# Patient Record
Sex: Male | Born: 1950 | Race: Black or African American | Hispanic: No | Smoking: Current every day smoker
Health system: Southern US, Community
[De-identification: ages and names within clinical notes are randomized; demographics above are authoritative.]

## PROBLEM LIST (undated history)

## (undated) DIAGNOSIS — I2699 Other pulmonary embolism without acute cor pulmonale: Secondary | ICD-10-CM

## (undated) DIAGNOSIS — K5792 Diverticulitis of intestine, part unspecified, without perforation or abscess without bleeding: Secondary | ICD-10-CM

## (undated) DIAGNOSIS — K5790 Diverticulosis of intestine, part unspecified, without perforation or abscess without bleeding: Secondary | ICD-10-CM

## (undated) DIAGNOSIS — I10 Essential (primary) hypertension: Secondary | ICD-10-CM

## (undated) DIAGNOSIS — R112 Nausea with vomiting, unspecified: Secondary | ICD-10-CM

## (undated) DIAGNOSIS — F419 Anxiety disorder, unspecified: Secondary | ICD-10-CM

## (undated) DIAGNOSIS — B029 Zoster without complications: Secondary | ICD-10-CM

## (undated) DIAGNOSIS — I82409 Acute embolism and thrombosis of unspecified deep veins of unspecified lower extremity: Secondary | ICD-10-CM

## (undated) DIAGNOSIS — Z86718 Personal history of other venous thrombosis and embolism: Secondary | ICD-10-CM

## (undated) DIAGNOSIS — K469 Unspecified abdominal hernia without obstruction or gangrene: Secondary | ICD-10-CM

## (undated) DIAGNOSIS — G629 Polyneuropathy, unspecified: Secondary | ICD-10-CM

## (undated) DIAGNOSIS — M549 Dorsalgia, unspecified: Secondary | ICD-10-CM

## (undated) DIAGNOSIS — F329 Major depressive disorder, single episode, unspecified: Secondary | ICD-10-CM

## (undated) DIAGNOSIS — F32A Depression, unspecified: Secondary | ICD-10-CM

## (undated) HISTORY — DX: Polyneuropathy, unspecified: G62.9

## (undated) HISTORY — DX: Personal history of other venous thrombosis and embolism: Z86.718

## (undated) HISTORY — DX: Diverticulosis of intestine, part unspecified, without perforation or abscess without bleeding: K57.90

## (undated) HISTORY — DX: Major depressive disorder, single episode, unspecified: F32.9

## (undated) HISTORY — PX: APPENDECTOMY: SHX54

## (undated) HISTORY — DX: Unspecified abdominal hernia without obstruction or gangrene: K46.9

## (undated) HISTORY — PX: HERNIA REPAIR: SHX51

## (undated) HISTORY — DX: Depression, unspecified: F32.A

## (undated) HISTORY — PX: BACK SURGERY: SHX140

## (undated) HISTORY — DX: Diverticulitis of intestine, part unspecified, without perforation or abscess without bleeding: K57.92

## (undated) HISTORY — PX: HIATAL HERNIA REPAIR: SHX195

## (undated) HISTORY — DX: Anxiety disorder, unspecified: F41.9

## (undated) HISTORY — DX: Nausea with vomiting, unspecified: R11.2

## (undated) HISTORY — DX: Essential (primary) hypertension: I10

---

## 2006-09-09 HISTORY — PX: CHOLECYSTECTOMY: SHX55

## 2008-04-06 ENCOUNTER — Encounter: Admission: RE | Admit: 2008-04-06 | Discharge: 2008-04-06 | Payer: Self-pay | Admitting: Orthopaedic Surgery

## 2008-04-06 ENCOUNTER — Emergency Department (HOSPITAL_COMMUNITY): Admission: EM | Admit: 2008-04-06 | Discharge: 2008-04-06 | Payer: Self-pay | Admitting: Emergency Medicine

## 2008-12-22 ENCOUNTER — Inpatient Hospital Stay (HOSPITAL_COMMUNITY): Admission: EM | Admit: 2008-12-22 | Discharge: 2008-12-30 | Payer: Self-pay | Admitting: Emergency Medicine

## 2008-12-27 ENCOUNTER — Ambulatory Visit: Payer: Self-pay | Admitting: Vascular Surgery

## 2008-12-27 ENCOUNTER — Encounter (INDEPENDENT_AMBULATORY_CARE_PROVIDER_SITE_OTHER): Payer: Self-pay | Admitting: Internal Medicine

## 2009-01-19 ENCOUNTER — Emergency Department (HOSPITAL_COMMUNITY): Admission: EM | Admit: 2009-01-19 | Discharge: 2009-01-19 | Payer: Self-pay | Admitting: Emergency Medicine

## 2009-02-09 ENCOUNTER — Encounter: Admission: RE | Admit: 2009-02-09 | Discharge: 2009-02-09 | Payer: Self-pay | Admitting: *Deleted

## 2009-02-25 ENCOUNTER — Emergency Department (HOSPITAL_COMMUNITY): Admission: EM | Admit: 2009-02-25 | Discharge: 2009-02-25 | Payer: Self-pay | Admitting: Emergency Medicine

## 2009-03-20 ENCOUNTER — Emergency Department (HOSPITAL_COMMUNITY): Admission: EM | Admit: 2009-03-20 | Discharge: 2009-03-20 | Payer: Self-pay | Admitting: Emergency Medicine

## 2009-05-22 ENCOUNTER — Emergency Department (HOSPITAL_COMMUNITY): Admission: EM | Admit: 2009-05-22 | Discharge: 2009-05-23 | Payer: Self-pay | Admitting: Emergency Medicine

## 2009-05-25 ENCOUNTER — Inpatient Hospital Stay (HOSPITAL_COMMUNITY): Admission: EM | Admit: 2009-05-25 | Discharge: 2009-05-28 | Payer: Self-pay | Admitting: Emergency Medicine

## 2009-08-01 ENCOUNTER — Emergency Department (HOSPITAL_COMMUNITY): Admission: EM | Admit: 2009-08-01 | Discharge: 2009-08-02 | Payer: Self-pay | Admitting: Emergency Medicine

## 2009-09-09 HISTORY — PX: SACRAL NERVE STIMULATOR PLACEMENT: SHX2367

## 2009-09-20 ENCOUNTER — Emergency Department (HOSPITAL_COMMUNITY): Admission: EM | Admit: 2009-09-20 | Discharge: 2009-09-21 | Payer: Self-pay | Admitting: Emergency Medicine

## 2009-12-09 ENCOUNTER — Emergency Department (HOSPITAL_COMMUNITY): Admission: EM | Admit: 2009-12-09 | Discharge: 2009-12-10 | Payer: Self-pay | Admitting: Emergency Medicine

## 2010-01-06 ENCOUNTER — Emergency Department (HOSPITAL_COMMUNITY): Admission: EM | Admit: 2010-01-06 | Discharge: 2010-01-06 | Payer: Self-pay | Admitting: Emergency Medicine

## 2010-02-26 ENCOUNTER — Emergency Department (HOSPITAL_COMMUNITY): Admission: EM | Admit: 2010-02-26 | Discharge: 2010-02-27 | Payer: Self-pay | Admitting: Emergency Medicine

## 2010-03-16 ENCOUNTER — Emergency Department (HOSPITAL_COMMUNITY)
Admission: EM | Admit: 2010-03-16 | Discharge: 2010-03-17 | Payer: Self-pay | Source: Home / Self Care | Admitting: Emergency Medicine

## 2010-03-28 ENCOUNTER — Ambulatory Visit: Payer: Self-pay | Admitting: Vascular Surgery

## 2010-03-28 ENCOUNTER — Encounter (INDEPENDENT_AMBULATORY_CARE_PROVIDER_SITE_OTHER): Payer: Self-pay | Admitting: Emergency Medicine

## 2010-03-28 ENCOUNTER — Observation Stay (HOSPITAL_COMMUNITY): Admission: EM | Admit: 2010-03-28 | Discharge: 2010-03-31 | Payer: Self-pay | Admitting: Emergency Medicine

## 2010-04-03 ENCOUNTER — Ambulatory Visit: Payer: Self-pay | Admitting: Oncology

## 2010-04-26 ENCOUNTER — Emergency Department (HOSPITAL_COMMUNITY)
Admission: EM | Admit: 2010-04-26 | Discharge: 2010-04-26 | Payer: Self-pay | Source: Home / Self Care | Admitting: Emergency Medicine

## 2010-05-07 ENCOUNTER — Ambulatory Visit: Payer: Self-pay | Admitting: Oncology

## 2010-05-09 LAB — CBC WITH DIFFERENTIAL/PLATELET
BASO%: 0.2 % (ref 0.0–2.0)
Basophils Absolute: 0 10*3/uL (ref 0.0–0.1)
EOS%: 2.2 % (ref 0.0–7.0)
Eosinophils Absolute: 0.1 10*3/uL (ref 0.0–0.5)
HCT: 36.2 % — ABNORMAL LOW (ref 38.4–49.9)
HGB: 11.7 g/dL — ABNORMAL LOW (ref 13.0–17.1)
LYMPH%: 40.7 % (ref 14.0–49.0)
MCH: 23.4 pg — ABNORMAL LOW (ref 27.2–33.4)
MCHC: 32.3 g/dL (ref 32.0–36.0)
MCV: 72.3 fL — ABNORMAL LOW (ref 79.3–98.0)
MONO#: 0.4 10*3/uL (ref 0.1–0.9)
MONO%: 9.6 % (ref 0.0–14.0)
NEUT#: 2 10*3/uL (ref 1.5–6.5)
NEUT%: 47.3 % (ref 39.0–75.0)
Platelets: 190 10*3/uL (ref 140–400)
RBC: 5.01 10*6/uL (ref 4.20–5.82)
RDW: 15.8 % — ABNORMAL HIGH (ref 11.0–14.6)
WBC: 4.2 10*3/uL (ref 4.0–10.3)
lymph#: 1.7 10*3/uL (ref 0.9–3.3)

## 2010-05-09 LAB — PROTIME-INR
INR: 2.4 (ref 2.00–3.50)
Protime: 28.8 Seconds — ABNORMAL HIGH (ref 10.6–13.4)

## 2010-05-10 LAB — COMPREHENSIVE METABOLIC PANEL
ALT: 13 U/L (ref 0–53)
AST: 18 U/L (ref 0–37)
Albumin: 3.7 g/dL (ref 3.5–5.2)
Alkaline Phosphatase: 46 U/L (ref 39–117)
BUN: 10 mg/dL (ref 6–23)
CO2: 29 mEq/L (ref 19–32)
Calcium: 9.1 mg/dL (ref 8.4–10.5)
Chloride: 108 mEq/L (ref 96–112)
Creatinine, Ser: 0.94 mg/dL (ref 0.40–1.50)
Glucose, Bld: 91 mg/dL (ref 70–99)
Potassium: 4.3 mEq/L (ref 3.5–5.3)
Sodium: 142 mEq/L (ref 135–145)
Total Bilirubin: 0.3 mg/dL (ref 0.3–1.2)
Total Protein: 5.7 g/dL — ABNORMAL LOW (ref 6.0–8.3)

## 2010-05-10 LAB — LACTATE DEHYDROGENASE: LDH: 228 U/L (ref 94–250)

## 2010-05-10 LAB — D-DIMER, QUANTITATIVE (NOT AT ARMC): D-Dimer, Quant: 0.22 ug/mL-FEU (ref 0.00–0.48)

## 2010-05-16 ENCOUNTER — Emergency Department (HOSPITAL_COMMUNITY): Admission: EM | Admit: 2010-05-16 | Discharge: 2010-05-16 | Payer: Self-pay | Admitting: Emergency Medicine

## 2010-05-30 ENCOUNTER — Emergency Department (HOSPITAL_COMMUNITY): Admission: EM | Admit: 2010-05-30 | Discharge: 2010-05-30 | Payer: Self-pay | Admitting: Emergency Medicine

## 2010-06-04 ENCOUNTER — Emergency Department (HOSPITAL_COMMUNITY)
Admission: EM | Admit: 2010-06-04 | Discharge: 2010-06-04 | Payer: Self-pay | Source: Home / Self Care | Admitting: Emergency Medicine

## 2010-06-06 ENCOUNTER — Ambulatory Visit (HOSPITAL_COMMUNITY): Admission: RE | Admit: 2010-06-06 | Discharge: 2010-06-06 | Payer: Self-pay | Admitting: General Surgery

## 2010-06-13 ENCOUNTER — Ambulatory Visit: Payer: Self-pay | Admitting: Oncology

## 2010-06-13 LAB — PROTIME-INR
INR: 2.2 (ref 2.00–3.50)
Protime: 26.4 Seconds — ABNORMAL HIGH (ref 10.6–13.4)

## 2010-06-18 LAB — CBC WITH DIFFERENTIAL/PLATELET
BASO%: 0.5 % (ref 0.0–2.0)
Basophils Absolute: 0 10*3/uL (ref 0.0–0.1)
EOS%: 0.8 % (ref 0.0–7.0)
Eosinophils Absolute: 0 10*3/uL (ref 0.0–0.5)
HCT: 39.1 % (ref 38.4–49.9)
HGB: 13.1 g/dL (ref 13.0–17.1)
LYMPH%: 40.1 % (ref 14.0–49.0)
MCH: 23.5 pg — ABNORMAL LOW (ref 27.2–33.4)
MCHC: 33.5 g/dL (ref 32.0–36.0)
MCV: 70.1 fL — ABNORMAL LOW (ref 79.3–98.0)
MONO#: 0.5 10*3/uL (ref 0.1–0.9)
MONO%: 12.8 % (ref 0.0–14.0)
NEUT#: 1.7 10*3/uL (ref 1.5–6.5)
NEUT%: 45.8 % (ref 39.0–75.0)
Platelets: 178 10*3/uL (ref 140–400)
RBC: 5.58 10*6/uL (ref 4.20–5.82)
RDW: 15.1 % — ABNORMAL HIGH (ref 11.0–14.6)
WBC: 3.7 10*3/uL — ABNORMAL LOW (ref 4.0–10.3)
lymph#: 1.5 10*3/uL (ref 0.9–3.3)
nRBC: 0 % (ref 0–0)

## 2010-06-18 LAB — PROTIME-INR
INR: 1.1 — ABNORMAL LOW (ref 2.00–3.50)
Protime: 13.2 Seconds (ref 10.6–13.4)

## 2010-06-20 ENCOUNTER — Inpatient Hospital Stay (HOSPITAL_COMMUNITY): Admission: RE | Admit: 2010-06-20 | Discharge: 2010-06-22 | Payer: Self-pay | Admitting: General Surgery

## 2010-06-25 LAB — CBC WITH DIFFERENTIAL/PLATELET
BASO%: 0.6 % (ref 0.0–2.0)
Basophils Absolute: 0 10*3/uL (ref 0.0–0.1)
EOS%: 4.4 % (ref 0.0–7.0)
Eosinophils Absolute: 0.2 10*3/uL (ref 0.0–0.5)
HCT: 38.6 % (ref 38.4–49.9)
HGB: 12.7 g/dL — ABNORMAL LOW (ref 13.0–17.1)
LYMPH%: 32.6 % (ref 14.0–49.0)
MCH: 23.4 pg — ABNORMAL LOW (ref 27.2–33.4)
MCHC: 32.9 g/dL (ref 32.0–36.0)
MCV: 71.1 fL — ABNORMAL LOW (ref 79.3–98.0)
MONO#: 0.4 10*3/uL (ref 0.1–0.9)
MONO%: 10.5 % (ref 0.0–14.0)
NEUT#: 1.9 10*3/uL (ref 1.5–6.5)
NEUT%: 51.9 % (ref 39.0–75.0)
Platelets: 183 10*3/uL (ref 140–400)
RBC: 5.43 10*6/uL (ref 4.20–5.82)
RDW: 15 % — ABNORMAL HIGH (ref 11.0–14.6)
WBC: 3.6 10*3/uL — ABNORMAL LOW (ref 4.0–10.3)
lymph#: 1.2 10*3/uL (ref 0.9–3.3)
nRBC: 0 % (ref 0–0)

## 2010-06-25 LAB — PROTIME-INR
INR: 2.5 (ref 2.00–3.50)
Protime: 30 Seconds — ABNORMAL HIGH (ref 10.6–13.4)

## 2010-07-16 ENCOUNTER — Other Ambulatory Visit: Payer: Self-pay | Admitting: Oncology

## 2010-07-18 LAB — PROTHROMBIN TIME
INR: 1.76 — ABNORMAL HIGH (ref <1.50)
Prothrombin Time: 20.7 seconds — ABNORMAL HIGH (ref 11.6–15.2)

## 2010-07-18 LAB — LUPUS ANTICOAGULANT PANEL
DRVVT: 42.6 secs (ref 36.2–44.3)
Lupus Anticoagulant: NOT DETECTED
PTT Lupus Anticoagulant: 40.2 secs (ref 30.0–45.6)

## 2010-07-18 LAB — CARDIOLIPIN ANTIBODIES, IGG, IGM, IGA
Anticardiolipin IgA: 2 APL U/mL (ref <22)
Anticardiolipin IgG: 5 GPL U/mL (ref <23)
Anticardiolipin IgM: 0 MPL U/mL (ref <11)

## 2010-07-18 LAB — PROTHROMBIN GENE MUTATION

## 2010-07-18 LAB — BETA-2 GLYCOPROTEIN ANTIBODIES
Beta-2 Glyco I IgG: 0 G Units (ref <20)
Beta-2-Glycoprotein I IgA: 2 A Units (ref <20)
Beta-2-Glycoprotein I IgM: 0 M Units (ref <20)

## 2010-07-18 LAB — HOMOCYSTEINE: Homocysteine: 10.4 umol/L (ref 4.0–15.4)

## 2010-07-18 LAB — FACTOR 8 ASSAY: Coagulation Factor VIII: 136 % (ref 73–140)

## 2010-07-18 LAB — FACTOR 5 LEIDEN

## 2010-07-18 LAB — ANTITHROMBIN III: AntiThromb III Func: 107 % (ref 76–126)

## 2010-07-24 ENCOUNTER — Other Ambulatory Visit: Payer: Self-pay | Admitting: Oncology

## 2010-07-24 LAB — CBC WITH DIFFERENTIAL/PLATELET
BASO%: 0.5 % (ref 0.0–2.0)
Basophils Absolute: 0 10*3/uL (ref 0.0–0.1)
EOS%: 1.8 % (ref 0.0–7.0)
Eosinophils Absolute: 0.1 10*3/uL (ref 0.0–0.5)
HCT: 40.4 % (ref 38.4–49.9)
HGB: 13.4 g/dL (ref 13.0–17.1)
LYMPH%: 45.4 % (ref 14.0–49.0)
MCH: 23.1 pg — ABNORMAL LOW (ref 27.2–33.4)
MCHC: 33.2 g/dL (ref 32.0–36.0)
MCV: 69.8 fL — ABNORMAL LOW (ref 79.3–98.0)
MONO#: 0.3 10*3/uL (ref 0.1–0.9)
MONO%: 7.5 % (ref 0.0–14.0)
NEUT#: 1.8 10*3/uL (ref 1.5–6.5)
NEUT%: 44.8 % (ref 39.0–75.0)
Platelets: 203 10*3/uL (ref 140–400)
RBC: 5.79 10*6/uL (ref 4.20–5.82)
RDW: 15.5 % — ABNORMAL HIGH (ref 11.0–14.6)
WBC: 4 10*3/uL (ref 4.0–10.3)
lymph#: 1.8 10*3/uL (ref 0.9–3.3)
nRBC: 0 % (ref 0–0)

## 2010-07-24 LAB — PROTIME-INR
INR: 2.2 (ref 2.00–3.50)
Protime: 26.4 Seconds — ABNORMAL HIGH (ref 10.6–13.4)

## 2010-07-30 ENCOUNTER — Other Ambulatory Visit: Payer: Self-pay | Admitting: Oncology

## 2010-07-30 LAB — PROTIME-INR
INR: 4.4 — ABNORMAL HIGH (ref 2.00–3.50)
Protime: 52.8 Seconds — ABNORMAL HIGH (ref 10.6–13.4)

## 2010-08-17 ENCOUNTER — Other Ambulatory Visit: Payer: Self-pay | Admitting: Oncology

## 2010-08-17 LAB — PROTIME-INR
INR: 1.6 — ABNORMAL LOW (ref 2.00–3.50)
Protime: 19.2 Seconds — ABNORMAL HIGH (ref 10.6–13.4)

## 2010-08-23 ENCOUNTER — Ambulatory Visit: Payer: Self-pay | Admitting: Oncology

## 2010-08-24 LAB — PROTIME-INR
INR: 1.7 — ABNORMAL LOW (ref 2.00–3.50)
Protime: 20.4 Seconds — ABNORMAL HIGH (ref 10.6–13.4)

## 2010-09-06 LAB — PROTIME-INR
INR: 1.1 — ABNORMAL LOW (ref 2.00–3.50)
Protime: 13.2 Seconds (ref 10.6–13.4)

## 2010-09-18 LAB — CBC WITH DIFFERENTIAL/PLATELET
BASO%: 4.3 % — ABNORMAL HIGH (ref 0.0–2.0)
Basophils Absolute: 0.2 10*3/uL — ABNORMAL HIGH (ref 0.0–0.1)
EOS%: 0.7 % (ref 0.0–7.0)
Eosinophils Absolute: 0 10*3/uL (ref 0.0–0.5)
HCT: 39.3 % (ref 38.4–49.9)
HGB: 12.8 g/dL — ABNORMAL LOW (ref 13.0–17.1)
LYMPH%: 35.9 % (ref 14.0–49.0)
MCH: 23.5 pg — ABNORMAL LOW (ref 27.2–33.4)
MCHC: 32.6 g/dL (ref 32.0–36.0)
MCV: 72.2 fL — ABNORMAL LOW (ref 79.3–98.0)
MONO#: 0.3 10*3/uL (ref 0.1–0.9)
MONO%: 6.1 % (ref 0.0–14.0)
NEUT#: 2.2 10*3/uL (ref 1.5–6.5)
NEUT%: 53 % (ref 39.0–75.0)
Platelets: 204 10*3/uL (ref 140–400)
RBC: 5.44 10*6/uL (ref 4.20–5.82)
RDW: 17.2 % — ABNORMAL HIGH (ref 11.0–14.6)
WBC: 4.1 10*3/uL (ref 4.0–10.3)
lymph#: 1.5 10*3/uL (ref 0.9–3.3)

## 2010-09-18 LAB — PROTIME-INR
INR: 1.6 — ABNORMAL LOW (ref 2.00–3.50)
Protime: 19.2 Seconds — ABNORMAL HIGH (ref 10.6–13.4)

## 2010-09-21 LAB — PROTIME-INR
INR: 2.1 (ref 2.00–3.50)
Protime: 25.2 Seconds — ABNORMAL HIGH (ref 10.6–13.4)

## 2010-10-01 ENCOUNTER — Encounter: Payer: Self-pay | Admitting: Orthopaedic Surgery

## 2010-10-03 ENCOUNTER — Ambulatory Visit: Payer: Self-pay | Admitting: Oncology

## 2010-10-05 LAB — PROTIME-INR
INR: 2.4 (ref 2.00–3.50)
Protime: 28.8 Seconds — ABNORMAL HIGH (ref 10.6–13.4)

## 2010-11-05 ENCOUNTER — Other Ambulatory Visit: Payer: Self-pay | Admitting: Oncology

## 2010-11-05 ENCOUNTER — Encounter (HOSPITAL_BASED_OUTPATIENT_CLINIC_OR_DEPARTMENT_OTHER): Payer: Medicare Other | Admitting: Oncology

## 2010-11-05 DIAGNOSIS — D689 Coagulation defect, unspecified: Secondary | ICD-10-CM

## 2010-11-05 DIAGNOSIS — Z7901 Long term (current) use of anticoagulants: Secondary | ICD-10-CM

## 2010-11-05 DIAGNOSIS — Z86718 Personal history of other venous thrombosis and embolism: Secondary | ICD-10-CM

## 2010-11-05 LAB — PROTIME-INR
INR: 4.7 — ABNORMAL HIGH (ref 2.00–3.50)
Protime: 56.4 Seconds — ABNORMAL HIGH (ref 10.6–13.4)

## 2010-11-07 ENCOUNTER — Encounter (HOSPITAL_BASED_OUTPATIENT_CLINIC_OR_DEPARTMENT_OTHER): Payer: Medicare Other | Admitting: Oncology

## 2010-11-07 ENCOUNTER — Other Ambulatory Visit: Payer: Self-pay | Admitting: Oncology

## 2010-11-07 DIAGNOSIS — D689 Coagulation defect, unspecified: Secondary | ICD-10-CM

## 2010-11-07 DIAGNOSIS — Z5181 Encounter for therapeutic drug level monitoring: Secondary | ICD-10-CM

## 2010-11-07 DIAGNOSIS — Z7901 Long term (current) use of anticoagulants: Secondary | ICD-10-CM

## 2010-11-07 DIAGNOSIS — Z86718 Personal history of other venous thrombosis and embolism: Secondary | ICD-10-CM

## 2010-11-07 LAB — PROTIME-INR
INR: 2 (ref 2.00–3.50)
Protime: 24 Seconds — ABNORMAL HIGH (ref 10.6–13.4)

## 2010-11-16 ENCOUNTER — Encounter (HOSPITAL_BASED_OUTPATIENT_CLINIC_OR_DEPARTMENT_OTHER): Payer: Medicare Other | Admitting: Oncology

## 2010-11-16 ENCOUNTER — Other Ambulatory Visit: Payer: Self-pay | Admitting: Oncology

## 2010-11-16 DIAGNOSIS — Z86718 Personal history of other venous thrombosis and embolism: Secondary | ICD-10-CM

## 2010-11-16 DIAGNOSIS — Z7901 Long term (current) use of anticoagulants: Secondary | ICD-10-CM

## 2010-11-16 DIAGNOSIS — D689 Coagulation defect, unspecified: Secondary | ICD-10-CM

## 2010-11-16 LAB — PROTIME-INR
INR: 2.1 (ref 2.00–3.50)
Protime: 25.2 Seconds — ABNORMAL HIGH (ref 10.6–13.4)

## 2010-11-16 LAB — CBC WITH DIFFERENTIAL/PLATELET
BASO%: 0.4 % (ref 0.0–2.0)
Basophils Absolute: 0 10*3/uL (ref 0.0–0.1)
EOS%: 1.4 % (ref 0.0–7.0)
Eosinophils Absolute: 0.1 10*3/uL (ref 0.0–0.5)
HCT: 39.3 % (ref 38.4–49.9)
HGB: 13.2 g/dL (ref 13.0–17.1)
LYMPH%: 28.1 % (ref 14.0–49.0)
MCH: 23.2 pg — ABNORMAL LOW (ref 27.2–33.4)
MCHC: 33.6 g/dL (ref 32.0–36.0)
MCV: 69.2 fL — ABNORMAL LOW (ref 79.3–98.0)
MONO#: 0.4 10*3/uL (ref 0.1–0.9)
MONO%: 7.2 % (ref 0.0–14.0)
NEUT#: 3.5 10*3/uL (ref 1.5–6.5)
NEUT%: 62.9 % (ref 39.0–75.0)
Platelets: 191 10*3/uL (ref 140–400)
RBC: 5.68 10*6/uL (ref 4.20–5.82)
RDW: 16.6 % — ABNORMAL HIGH (ref 11.0–14.6)
WBC: 5.5 10*3/uL (ref 4.0–10.3)
lymph#: 1.6 10*3/uL (ref 0.9–3.3)

## 2010-11-22 LAB — POCT I-STAT, CHEM 8
BUN: 12 mg/dL (ref 6–23)
BUN: 7 mg/dL (ref 6–23)
Calcium, Ion: 1.12 mmol/L (ref 1.12–1.32)
Calcium, Ion: 1.18 mmol/L (ref 1.12–1.32)
Chloride: 108 mEq/L (ref 96–112)
Chloride: 104 mEq/L (ref 96–112)
Creatinine, Ser: 0.9 mg/dL (ref 0.4–1.5)
Creatinine, Ser: 1 mg/dL (ref 0.4–1.5)
Glucose, Bld: 96 mg/dL (ref 70–99)
Glucose, Bld: 111 mg/dL — ABNORMAL HIGH (ref 70–99)
HCT: 43 % (ref 39.0–52.0)
HCT: 44 % (ref 39.0–52.0)
Hemoglobin: 14.6 g/dL (ref 13.0–17.0)
Hemoglobin: 15 g/dL (ref 13.0–17.0)
Potassium: 5 mEq/L (ref 3.5–5.1)
Potassium: 4 mEq/L (ref 3.5–5.1)
Sodium: 140 mEq/L (ref 135–145)
Sodium: 139 mEq/L (ref 135–145)
TCO2: 26 mmol/L (ref 0–100)
TCO2: 26 mmol/L (ref 0–100)

## 2010-11-22 LAB — URINALYSIS, ROUTINE W REFLEX MICROSCOPIC
Bilirubin Urine: NEGATIVE
Bilirubin Urine: NEGATIVE
Glucose, UA: NEGATIVE mg/dL
Glucose, UA: NEGATIVE mg/dL
Hgb urine dipstick: NEGATIVE
Hgb urine dipstick: NEGATIVE
Ketones, ur: NEGATIVE mg/dL
Ketones, ur: NEGATIVE mg/dL
Nitrite: NEGATIVE
Nitrite: NEGATIVE
Protein, ur: NEGATIVE mg/dL
Protein, ur: NEGATIVE mg/dL
Specific Gravity, Urine: 1.019 (ref 1.005–1.030)
Specific Gravity, Urine: 1.008 (ref 1.005–1.030)
Urobilinogen, UA: 0.2 mg/dL (ref 0.0–1.0)
Urobilinogen, UA: 0.2 mg/dL (ref 0.0–1.0)
pH: 6 (ref 5.0–8.0)
pH: 6 (ref 5.0–8.0)

## 2010-11-22 LAB — CBC
HCT: 36.5 % — ABNORMAL LOW (ref 39.0–52.0)
HCT: 38.5 % — ABNORMAL LOW (ref 39.0–52.0)
HCT: 38.9 % — ABNORMAL LOW (ref 39.0–52.0)
HCT: 40.1 % (ref 39.0–52.0)
Hemoglobin: 12 g/dL — ABNORMAL LOW (ref 13.0–17.0)
Hemoglobin: 12.6 g/dL — ABNORMAL LOW (ref 13.0–17.0)
Hemoglobin: 13 g/dL (ref 13.0–17.0)
Hemoglobin: 13 g/dL (ref 13.0–17.0)
MCH: 23 pg — ABNORMAL LOW (ref 26.0–34.0)
MCH: 23.7 pg — ABNORMAL LOW (ref 26.0–34.0)
MCH: 23.9 pg — ABNORMAL LOW (ref 26.0–34.0)
MCH: 23.9 pg — ABNORMAL LOW (ref 26.0–34.0)
MCHC: 32.3 g/dL (ref 30.0–36.0)
MCHC: 32.5 g/dL (ref 30.0–36.0)
MCHC: 32.9 g/dL (ref 30.0–36.0)
MCHC: 33.8 g/dL (ref 30.0–36.0)
MCV: 70.1 fL — ABNORMAL LOW (ref 78.0–100.0)
MCV: 70.1 fL — ABNORMAL LOW (ref 78.0–100.0)
MCV: 73.4 fL — ABNORMAL LOW (ref 78.0–100.0)
MCV: 73.8 fL — ABNORMAL LOW (ref 78.0–100.0)
Platelets: 180 10*3/uL (ref 150–400)
Platelets: 190 10*3/uL (ref 150–400)
Platelets: 223 10*3/uL (ref 150–400)
Platelets: 224 10*3/uL (ref 150–400)
RBC: 5.21 MIL/uL (ref 4.22–5.81)
RBC: 5.3 MIL/uL (ref 4.22–5.81)
RBC: 5.44 MIL/uL (ref 4.22–5.81)
RBC: 5.49 MIL/uL (ref 4.22–5.81)
RDW: 14.9 % (ref 11.5–15.5)
RDW: 15.1 % (ref 11.5–15.5)
RDW: 15.1 % (ref 11.5–15.5)
RDW: 15.4 % (ref 11.5–15.5)
WBC: 3.9 10*3/uL — ABNORMAL LOW (ref 4.0–10.5)
WBC: 4.3 10*3/uL (ref 4.0–10.5)
WBC: 4.5 10*3/uL (ref 4.0–10.5)
WBC: 4.9 10*3/uL (ref 4.0–10.5)

## 2010-11-22 LAB — COMPREHENSIVE METABOLIC PANEL
ALT: 11 U/L (ref 0–53)
ALT: 11 U/L (ref 0–53)
ALT: 15 U/L (ref 0–53)
AST: 17 U/L (ref 0–37)
AST: 17 U/L (ref 0–37)
AST: 17 U/L (ref 0–37)
Albumin: 3.6 g/dL (ref 3.5–5.2)
Albumin: 3.8 g/dL (ref 3.5–5.2)
Albumin: 3.9 g/dL (ref 3.5–5.2)
Alkaline Phosphatase: 45 U/L (ref 39–117)
Alkaline Phosphatase: 50 U/L (ref 39–117)
Alkaline Phosphatase: 55 U/L (ref 39–117)
BUN: 10 mg/dL (ref 6–23)
BUN: 7 mg/dL (ref 6–23)
BUN: 8 mg/dL (ref 6–23)
CO2: 26 mEq/L (ref 19–32)
CO2: 28 mEq/L (ref 19–32)
CO2: 29 mEq/L (ref 19–32)
Calcium: 8.7 mg/dL (ref 8.4–10.5)
Calcium: 9.2 mg/dL (ref 8.4–10.5)
Calcium: 9.4 mg/dL (ref 8.4–10.5)
Chloride: 102 mEq/L (ref 96–112)
Chloride: 107 mEq/L (ref 96–112)
Chloride: 110 mEq/L (ref 96–112)
Creatinine, Ser: 0.9 mg/dL (ref 0.4–1.5)
Creatinine, Ser: 0.91 mg/dL (ref 0.4–1.5)
Creatinine, Ser: 1 mg/dL (ref 0.4–1.5)
GFR calc Af Amer: >60 mL/min (ref >60)
GFR calc Af Amer: >60 mL/min (ref >60)
GFR calc Af Amer: >60 mL/min (ref >60)
GFR calc non Af Amer: >60 mL/min (ref >60)
GFR calc non Af Amer: >60 mL/min (ref >60)
GFR calc non Af Amer: >60 mL/min (ref >60)
Glucose, Bld: 100 mg/dL — ABNORMAL HIGH (ref 70–99)
Glucose, Bld: 111 mg/dL — ABNORMAL HIGH (ref 70–99)
Glucose, Bld: 86 mg/dL (ref 70–99)
Potassium: 4 mEq/L (ref 3.5–5.1)
Potassium: 4.5 mEq/L (ref 3.5–5.1)
Potassium: 4.5 mEq/L (ref 3.5–5.1)
Sodium: 137 mEq/L (ref 135–145)
Sodium: 139 mEq/L (ref 135–145)
Sodium: 143 mEq/L (ref 135–145)
Total Bilirubin: 0.1 mg/dL — ABNORMAL LOW (ref 0.3–1.2)
Total Bilirubin: 0.7 mg/dL (ref 0.3–1.2)
Total Bilirubin: 0.9 mg/dL (ref 0.3–1.2)
Total Protein: 6.2 g/dL (ref 6.0–8.3)
Total Protein: 6.2 g/dL (ref 6.0–8.3)
Total Protein: 6.7 g/dL (ref 6.0–8.3)

## 2010-11-22 LAB — DIFFERENTIAL
Basophils Absolute: 0 10*3/uL (ref 0.0–0.1)
Basophils Absolute: 0 10*3/uL (ref 0.0–0.1)
Basophils Absolute: 0 10*3/uL (ref 0.0–0.1)
Basophils Relative: 0 % (ref 0–1)
Basophils Relative: 1 % (ref 0–1)
Basophils Relative: 1 % (ref 0–1)
Eosinophils Absolute: 0 10*3/uL (ref 0.0–0.7)
Eosinophils Absolute: 0.1 10*3/uL (ref 0.0–0.7)
Eosinophils Absolute: 0.1 10*3/uL (ref 0.0–0.7)
Eosinophils Relative: 1 % (ref 0–5)
Eosinophils Relative: 1 % (ref 0–5)
Eosinophils Relative: 2 % (ref 0–5)
Lymphocytes Relative: 26 % (ref 12–46)
Lymphocytes Relative: 30 % (ref 12–46)
Lymphocytes Relative: 33 % (ref 12–46)
Lymphs Abs: 1.2 10*3/uL (ref 0.7–4.0)
Lymphs Abs: 1.2 10*3/uL (ref 0.7–4.0)
Lymphs Abs: 1.4 10*3/uL (ref 0.7–4.0)
Monocytes Absolute: 0.3 10*3/uL (ref 0.1–1.0)
Monocytes Absolute: 0.3 10*3/uL (ref 0.1–1.0)
Monocytes Absolute: 0.4 10*3/uL (ref 0.1–1.0)
Monocytes Relative: 7 % (ref 3–12)
Monocytes Relative: 8 % (ref 3–12)
Monocytes Relative: 9 % (ref 3–12)
Neutro Abs: 2.3 10*3/uL (ref 1.7–7.7)
Neutro Abs: 2.5 10*3/uL (ref 1.7–7.7)
Neutro Abs: 2.9 10*3/uL (ref 1.7–7.7)
Neutrophils Relative %: 58 % (ref 43–77)
Neutrophils Relative %: 59 % (ref 43–77)
Neutrophils Relative %: 65 % (ref 43–77)

## 2010-11-22 LAB — APTT
aPTT: 31 seconds (ref 24–37)
aPTT: 34 seconds (ref 24–37)
aPTT: 38 seconds — ABNORMAL HIGH (ref 24–37)

## 2010-11-22 LAB — PROTIME-INR
INR: 1 (ref 0.00–1.49)
INR: 1.55 — ABNORMAL HIGH (ref 0.00–1.49)
INR: 1.97 — ABNORMAL HIGH (ref 0.00–1.49)
INR: 3.08 — ABNORMAL HIGH (ref 0.00–1.49)
Prothrombin Time: 13.4 seconds (ref 11.6–15.2)
Prothrombin Time: 18.8 seconds — ABNORMAL HIGH (ref 11.6–15.2)
Prothrombin Time: 22.6 seconds — ABNORMAL HIGH (ref 11.6–15.2)
Prothrombin Time: 31.8 seconds — ABNORMAL HIGH (ref 11.6–15.2)

## 2010-11-22 LAB — HEMOCCULT GUIAC POC 1CARD (OFFICE): Fecal Occult Bld: NEGATIVE

## 2010-11-22 LAB — SURGICAL PCR SCREEN
MRSA, PCR: NEGATIVE
Staphylococcus aureus: POSITIVE — AB

## 2010-11-23 LAB — POCT I-STAT, CHEM 8
BUN: 4 mg/dL — ABNORMAL LOW (ref 6–23)
Calcium, Ion: 1.18 mmol/L (ref 1.12–1.32)
Chloride: 104 mEq/L (ref 96–112)
Creatinine, Ser: 1 mg/dL (ref 0.4–1.5)
Glucose, Bld: 86 mg/dL (ref 70–99)
HCT: 46 % (ref 39.0–52.0)
Hemoglobin: 15.6 g/dL (ref 13.0–17.0)
Potassium: 3.7 mEq/L (ref 3.5–5.1)
Sodium: 142 mEq/L (ref 135–145)
TCO2: 26 mmol/L (ref 0–100)

## 2010-11-23 LAB — CBC
HCT: 41.5 % (ref 39.0–52.0)
Hemoglobin: 13.6 g/dL (ref 13.0–17.0)
MCH: 24 pg — ABNORMAL LOW (ref 26.0–34.0)
MCHC: 32.7 g/dL (ref 30.0–36.0)
MCV: 73.3 fL — ABNORMAL LOW (ref 78.0–100.0)
Platelets: 204 10*3/uL (ref 150–400)
RBC: 5.66 MIL/uL (ref 4.22–5.81)
RDW: 15.1 % (ref 11.5–15.5)
WBC: 4.1 10*3/uL (ref 4.0–10.5)

## 2010-11-23 LAB — URINE CULTURE
Colony Count: NO GROWTH
Culture  Setup Time: 201108190211
Culture: NO GROWTH

## 2010-11-23 LAB — URINALYSIS, ROUTINE W REFLEX MICROSCOPIC
Glucose, UA: NEGATIVE mg/dL
Hgb urine dipstick: NEGATIVE
Nitrite: NEGATIVE
Protein, ur: NEGATIVE mg/dL
Specific Gravity, Urine: 1.022 (ref 1.005–1.030)
Urobilinogen, UA: 0.2 mg/dL (ref 0.0–1.0)
pH: 6.5 (ref 5.0–8.0)

## 2010-11-23 LAB — DIFFERENTIAL
Band Neutrophils: 0 % (ref 0–10)
Basophils Absolute: 0.1 10*3/uL (ref 0.0–0.1)
Basophils Relative: 2 % — ABNORMAL HIGH (ref 0–1)
Blasts: 0 %
Eosinophils Absolute: 0 10*3/uL (ref 0.0–0.7)
Eosinophils Relative: 1 % (ref 0–5)
Lymphocytes Relative: 61 % — ABNORMAL HIGH (ref 12–46)
Lymphs Abs: 2.6 10*3/uL (ref 0.7–4.0)
Metamyelocytes Relative: 0 %
Monocytes Absolute: 0 10*3/uL — ABNORMAL LOW (ref 0.1–1.0)
Monocytes Relative: 1 % — ABNORMAL LOW (ref 3–12)
Myelocytes: 0 %
Neutro Abs: 1.4 10*3/uL — ABNORMAL LOW (ref 1.7–7.7)
Neutrophils Relative %: 35 % — ABNORMAL LOW (ref 43–77)
Promyelocytes Absolute: 0 %
nRBC: 0 /100 WBC

## 2010-11-23 LAB — PROTIME-INR
INR: 5.8 (ref 0.00–1.49)
Prothrombin Time: 51.8 seconds — ABNORMAL HIGH (ref 11.6–15.2)

## 2010-11-23 LAB — HEPATIC FUNCTION PANEL
ALT: 13 U/L (ref 0–53)
AST: 18 U/L (ref 0–37)
Albumin: 4 g/dL (ref 3.5–5.2)
Alkaline Phosphatase: 52 U/L (ref 39–117)
Bilirubin, Direct: 0.1 mg/dL (ref 0.0–0.3)
Indirect Bilirubin: 0.3 mg/dL (ref 0.3–0.9)
Total Bilirubin: 0.4 mg/dL (ref 0.3–1.2)
Total Protein: 6.6 g/dL (ref 6.0–8.3)

## 2010-11-23 LAB — LIPASE, BLOOD: Lipase: 88 U/L — ABNORMAL HIGH (ref 11–59)

## 2010-11-24 LAB — PROTIME-INR
INR: 2.44 — ABNORMAL HIGH (ref 0.00–1.49)
INR: 2.62 — ABNORMAL HIGH (ref 0.00–1.49)
INR: 3.95 — ABNORMAL HIGH (ref 0.00–1.49)
INR: 4.27 — ABNORMAL HIGH (ref 0.00–1.49)
Prothrombin Time: 26.3 seconds — ABNORMAL HIGH (ref 11.6–15.2)
Prothrombin Time: 27.8 seconds — ABNORMAL HIGH (ref 11.6–15.2)
Prothrombin Time: 38.3 seconds — ABNORMAL HIGH (ref 11.6–15.2)
Prothrombin Time: 40.7 seconds — ABNORMAL HIGH (ref 11.6–15.2)

## 2010-11-24 LAB — BLOOD GAS, ARTERIAL
Acid-Base Excess: 2.3 mmol/L — ABNORMAL HIGH (ref 0.0–2.0)
Bicarbonate: 26.9 mEq/L — ABNORMAL HIGH (ref 20.0–24.0)
Drawn by: 326301
FIO2: 0.24 %
O2 Saturation: 95.5 %
Patient temperature: 98.6
TCO2: 24.1 mmol/L (ref 0–100)
pCO2 arterial: 44.2 mmHg (ref 35.0–45.0)
pH, Arterial: 7.402 (ref 7.350–7.450)
pO2, Arterial: 76.4 mmHg — ABNORMAL LOW (ref 80.0–100.0)

## 2010-11-24 LAB — BASIC METABOLIC PANEL
BUN: 10 mg/dL (ref 6–23)
CO2: 30 mEq/L (ref 19–32)
Calcium: 9.2 mg/dL (ref 8.4–10.5)
Chloride: 107 mEq/L (ref 96–112)
Creatinine, Ser: 0.92 mg/dL (ref 0.4–1.5)
GFR calc Af Amer: >60 mL/min (ref >60)
GFR calc non Af Amer: >60 mL/min (ref >60)
Glucose, Bld: 73 mg/dL (ref 70–99)
Potassium: 4.5 mEq/L (ref 3.5–5.1)
Sodium: 141 mEq/L (ref 135–145)

## 2010-11-24 LAB — CBC
HCT: 39.9 % (ref 39.0–52.0)
HCT: 40.7 % (ref 39.0–52.0)
HCT: 42.4 % (ref 39.0–52.0)
HCT: 43.8 % (ref 39.0–52.0)
Hemoglobin: 12.9 g/dL — ABNORMAL LOW (ref 13.0–17.0)
Hemoglobin: 13.1 g/dL (ref 13.0–17.0)
Hemoglobin: 13.7 g/dL (ref 13.0–17.0)
Hemoglobin: 14.2 g/dL (ref 13.0–17.0)
MCH: 23.8 pg — ABNORMAL LOW (ref 26.0–34.0)
MCH: 24 pg — ABNORMAL LOW (ref 26.0–34.0)
MCH: 24 pg — ABNORMAL LOW (ref 26.0–34.0)
MCH: 24 pg — ABNORMAL LOW (ref 26.0–34.0)
MCHC: 32 g/dL (ref 30.0–36.0)
MCHC: 32.3 g/dL (ref 30.0–36.0)
MCHC: 32.4 g/dL (ref 30.0–36.0)
MCHC: 32.4 g/dL (ref 30.0–36.0)
MCV: 74.1 fL — ABNORMAL LOW (ref 78.0–100.0)
MCV: 74.2 fL — ABNORMAL LOW (ref 78.0–100.0)
MCV: 74.2 fL — ABNORMAL LOW (ref 78.0–100.0)
MCV: 74.3 fL — ABNORMAL LOW (ref 78.0–100.0)
Platelets: 205 10*3/uL (ref 150–400)
Platelets: 214 10*3/uL (ref 150–400)
Platelets: 214 10*3/uL (ref 150–400)
Platelets: 228 10*3/uL (ref 150–400)
RBC: 5.38 MIL/uL (ref 4.22–5.81)
RBC: 5.49 MIL/uL (ref 4.22–5.81)
RBC: 5.71 MIL/uL (ref 4.22–5.81)
RBC: 5.91 MIL/uL — ABNORMAL HIGH (ref 4.22–5.81)
RDW: 16 % — ABNORMAL HIGH (ref 11.5–15.5)
RDW: 16.2 % — ABNORMAL HIGH (ref 11.5–15.5)
RDW: 16.2 % — ABNORMAL HIGH (ref 11.5–15.5)
RDW: 16.4 % — ABNORMAL HIGH (ref 11.5–15.5)
WBC: 4 10*3/uL (ref 4.0–10.5)
WBC: 4.3 10*3/uL (ref 4.0–10.5)
WBC: 4.9 10*3/uL (ref 4.0–10.5)
WBC: 5 10*3/uL (ref 4.0–10.5)

## 2010-11-24 LAB — CARDIAC PANEL(CRET KIN+CKTOT+MB+TROPI)
CK, MB: 1.4 ng/mL (ref 0.3–4.0)
CK, MB: 1.5 ng/mL (ref 0.3–4.0)
CK, MB: 1.6 ng/mL (ref 0.3–4.0)
Relative Index: 0.7 (ref 0.0–2.5)
Relative Index: 0.7 (ref 0.0–2.5)
Relative Index: 0.7 (ref 0.0–2.5)
Total CK: 214 U/L (ref 7–232)
Total CK: 229 U/L (ref 7–232)
Total CK: 246 U/L — ABNORMAL HIGH (ref 7–232)
Troponin I: 0.01 ng/mL (ref 0.00–0.06)
Troponin I: 0.02 ng/mL (ref 0.00–0.06)
Troponin I: <0.01 ng/mL (ref 0.00–0.06)

## 2010-11-24 LAB — DIFFERENTIAL
Basophils Absolute: 0 10*3/uL (ref 0.0–0.1)
Basophils Relative: 0 % (ref 0–1)
Eosinophils Absolute: 0.1 10*3/uL (ref 0.0–0.7)
Eosinophils Relative: 2 % (ref 0–5)
Lymphocytes Relative: 29 % (ref 12–46)
Lymphs Abs: 1.4 10*3/uL (ref 0.7–4.0)
Monocytes Absolute: 0.2 10*3/uL (ref 0.1–1.0)
Monocytes Relative: 4 % (ref 3–12)
Neutro Abs: 3.3 10*3/uL (ref 1.7–7.7)
Neutrophils Relative %: 66 % (ref 43–77)

## 2010-11-24 LAB — APTT: aPTT: 55 seconds — ABNORMAL HIGH (ref 24–37)

## 2010-11-25 LAB — COMPREHENSIVE METABOLIC PANEL
ALT: 23 U/L (ref 0–53)
AST: 22 U/L (ref 0–37)
Albumin: 4.1 g/dL (ref 3.5–5.2)
Alkaline Phosphatase: 55 U/L (ref 39–117)
BUN: 10 mg/dL (ref 6–23)
CO2: 22 mEq/L (ref 19–32)
Calcium: 9.5 mg/dL (ref 8.4–10.5)
Chloride: 107 mEq/L (ref 96–112)
Creatinine, Ser: 1.27 mg/dL (ref 0.4–1.5)
GFR calc Af Amer: >60 mL/min (ref >60)
GFR calc non Af Amer: 58 mL/min — ABNORMAL LOW (ref >60)
Glucose, Bld: 96 mg/dL (ref 70–99)
Potassium: 3.9 mEq/L (ref 3.5–5.1)
Sodium: 138 mEq/L (ref 135–145)
Total Bilirubin: 0.9 mg/dL (ref 0.3–1.2)
Total Protein: 6.7 g/dL (ref 6.0–8.3)

## 2010-11-25 LAB — CBC
HCT: 39.8 % (ref 39.0–52.0)
HCT: 40.6 % (ref 39.0–52.0)
Hemoglobin: 13.6 g/dL (ref 13.0–17.0)
Hemoglobin: 13.1 g/dL (ref 13.0–17.0)
MCH: 25.5 pg — ABNORMAL LOW (ref 26.0–34.0)
MCHC: 34.1 g/dL (ref 30.0–36.0)
MCHC: 32.2 g/dL (ref 30.0–36.0)
MCV: 74.7 fL — ABNORMAL LOW (ref 78.0–100.0)
MCV: 74.5 fL — ABNORMAL LOW (ref 78.0–100.0)
Platelets: 217 10*3/uL (ref 150–400)
Platelets: 256 10*3/uL (ref 150–400)
RBC: 5.32 MIL/uL (ref 4.22–5.81)
RBC: 5.45 MIL/uL (ref 4.22–5.81)
RDW: 15.3 % (ref 11.5–15.5)
RDW: 15.9 % — ABNORMAL HIGH (ref 11.5–15.5)
WBC: 5.1 10*3/uL (ref 4.0–10.5)
WBC: 5.1 10*3/uL (ref 4.0–10.5)

## 2010-11-25 LAB — DIFFERENTIAL
Basophils Absolute: 0 10*3/uL (ref 0.0–0.1)
Basophils Absolute: 0 10*3/uL (ref 0.0–0.1)
Basophils Relative: 0 % (ref 0–1)
Basophils Relative: 0 % (ref 0–1)
Eosinophils Absolute: 0 10*3/uL (ref 0.0–0.7)
Eosinophils Absolute: 0 10*3/uL (ref 0.0–0.7)
Eosinophils Relative: 0 % (ref 0–5)
Eosinophils Relative: 0 % (ref 0–5)
Lymphocytes Relative: 31 % (ref 12–46)
Lymphocytes Relative: 27 % (ref 12–46)
Lymphs Abs: 1.6 10*3/uL (ref 0.7–4.0)
Lymphs Abs: 1.4 10*3/uL (ref 0.7–4.0)
Monocytes Absolute: 0.4 10*3/uL (ref 0.1–1.0)
Monocytes Absolute: 0.4 10*3/uL (ref 0.1–1.0)
Monocytes Relative: 7 % (ref 3–12)
Monocytes Relative: 7 % (ref 3–12)
Neutro Abs: 3.1 10*3/uL (ref 1.7–7.7)
Neutro Abs: 3.3 10*3/uL (ref 1.7–7.7)
Neutrophils Relative %: 61 % (ref 43–77)
Neutrophils Relative %: 65 % (ref 43–77)

## 2010-11-25 LAB — BASIC METABOLIC PANEL
BUN: 9 mg/dL (ref 6–23)
CO2: 26 mEq/L (ref 19–32)
Calcium: 9.2 mg/dL (ref 8.4–10.5)
Chloride: 108 mEq/L (ref 96–112)
Creatinine, Ser: 1.41 mg/dL (ref 0.4–1.5)
GFR calc Af Amer: >60 mL/min (ref >60)
GFR calc non Af Amer: 51 mL/min — ABNORMAL LOW (ref >60)
Glucose, Bld: 97 mg/dL (ref 70–99)
Potassium: 3.7 mEq/L (ref 3.5–5.1)
Sodium: 141 mEq/L (ref 135–145)

## 2010-11-25 LAB — URINE MICROSCOPIC-ADD ON

## 2010-11-25 LAB — URINALYSIS, ROUTINE W REFLEX MICROSCOPIC
Glucose, UA: NEGATIVE mg/dL
Glucose, UA: NEGATIVE mg/dL
Hgb urine dipstick: NEGATIVE
Hgb urine dipstick: NEGATIVE
Leukocytes, UA: NEGATIVE
Nitrite: NEGATIVE
Nitrite: NEGATIVE
Protein, ur: 30 mg/dL — AB
Protein, ur: 30 mg/dL — AB
Specific Gravity, Urine: 1.037 — ABNORMAL HIGH (ref 1.005–1.030)
Specific Gravity, Urine: 1.033 — ABNORMAL HIGH (ref 1.005–1.030)
Urobilinogen, UA: 1 mg/dL (ref 0.0–1.0)
Urobilinogen, UA: 1 mg/dL (ref 0.0–1.0)
pH: 5.5 (ref 5.0–8.0)
pH: 5.5 (ref 5.0–8.0)

## 2010-11-28 LAB — POCT I-STAT, CHEM 8
BUN: 12 mg/dL (ref 6–23)
Calcium, Ion: 1.07 mmol/L — ABNORMAL LOW (ref 1.12–1.32)
Chloride: 106 mEq/L (ref 96–112)
Creatinine, Ser: 1 mg/dL (ref 0.4–1.5)
Glucose, Bld: 91 mg/dL (ref 70–99)
HCT: 49 % (ref 39.0–52.0)
Hemoglobin: 16.7 g/dL (ref 13.0–17.0)
Potassium: 3.5 mEq/L (ref 3.5–5.1)
Sodium: 136 mEq/L (ref 135–145)
TCO2: 24 mmol/L (ref 0–100)

## 2010-11-28 LAB — CULTURE, BLOOD (ROUTINE X 2)
Culture: NO GROWTH
Culture: NO GROWTH

## 2010-11-28 LAB — CBC
HCT: 44.9 % (ref 39.0–52.0)
Hemoglobin: 14.4 g/dL (ref 13.0–17.0)
MCHC: 32.2 g/dL (ref 30.0–36.0)
MCV: 74.5 fL — ABNORMAL LOW (ref 78.0–100.0)
Platelets: 236 10*3/uL (ref 150–400)
RBC: 6.02 MIL/uL — ABNORMAL HIGH (ref 4.22–5.81)
RDW: 16.1 % — ABNORMAL HIGH (ref 11.5–15.5)
WBC: 5.1 10*3/uL (ref 4.0–10.5)

## 2010-11-28 LAB — PROTIME-INR
INR: 2.63 — ABNORMAL HIGH (ref 0.00–1.49)
Prothrombin Time: 27.9 seconds — ABNORMAL HIGH (ref 11.6–15.2)

## 2010-11-28 LAB — DIFFERENTIAL
Basophils Absolute: 0 10*3/uL (ref 0.0–0.1)
Basophils Relative: 1 % (ref 0–1)
Eosinophils Absolute: 0 10*3/uL (ref 0.0–0.7)
Eosinophils Relative: 0 % (ref 0–5)
Lymphocytes Relative: 25 % (ref 12–46)
Lymphs Abs: 1.3 10*3/uL (ref 0.7–4.0)
Monocytes Absolute: 0.3 10*3/uL (ref 0.1–1.0)
Monocytes Relative: 6 % (ref 3–12)
Neutro Abs: 3.4 10*3/uL (ref 1.7–7.7)
Neutrophils Relative %: 67 % (ref 43–77)

## 2010-11-28 LAB — URINALYSIS, ROUTINE W REFLEX MICROSCOPIC
Bilirubin Urine: NEGATIVE
Glucose, UA: NEGATIVE mg/dL
Hgb urine dipstick: NEGATIVE
Ketones, ur: NEGATIVE mg/dL
Nitrite: NEGATIVE
Protein, ur: NEGATIVE mg/dL
Specific Gravity, Urine: 1.016 (ref 1.005–1.030)
Urobilinogen, UA: 0.2 mg/dL (ref 0.0–1.0)
pH: 6 (ref 5.0–8.0)

## 2010-11-28 LAB — SEDIMENTATION RATE: Sed Rate: 3 mm/hr (ref 0–16)

## 2010-11-28 LAB — LACTIC ACID, PLASMA: Lactic Acid, Venous: 1.4 mmol/L (ref 0.5–2.2)

## 2010-12-07 ENCOUNTER — Other Ambulatory Visit: Payer: Self-pay | Admitting: Oncology

## 2010-12-07 ENCOUNTER — Encounter (HOSPITAL_BASED_OUTPATIENT_CLINIC_OR_DEPARTMENT_OTHER): Payer: Medicare Other | Admitting: Oncology

## 2010-12-07 DIAGNOSIS — D689 Coagulation defect, unspecified: Secondary | ICD-10-CM

## 2010-12-07 DIAGNOSIS — Z86718 Personal history of other venous thrombosis and embolism: Secondary | ICD-10-CM

## 2010-12-07 DIAGNOSIS — Z7901 Long term (current) use of anticoagulants: Secondary | ICD-10-CM

## 2010-12-07 LAB — CBC WITH DIFFERENTIAL/PLATELET
BASO%: 0.7 % (ref 0.0–2.0)
Basophils Absolute: 0 10*3/uL (ref 0.0–0.1)
EOS%: 0.7 % (ref 0.0–7.0)
Eosinophils Absolute: 0 10*3/uL (ref 0.0–0.5)
HCT: 41.1 % (ref 38.4–49.9)
HGB: 14 g/dL (ref 13.0–17.1)
LYMPH%: 38.4 % (ref 14.0–49.0)
MCH: 23.3 pg — ABNORMAL LOW (ref 27.2–33.4)
MCHC: 34.1 g/dL (ref 32.0–36.0)
MCV: 68.3 fL — ABNORMAL LOW (ref 79.3–98.0)
MONO#: 0.4 10*3/uL (ref 0.1–0.9)
MONO%: 9.1 % (ref 0.0–14.0)
NEUT#: 2.3 10*3/uL (ref 1.5–6.5)
NEUT%: 51.1 % (ref 39.0–75.0)
Platelets: 261 10*3/uL (ref 140–400)
RBC: 6.02 10*6/uL — ABNORMAL HIGH (ref 4.20–5.82)
RDW: 15.5 % — ABNORMAL HIGH (ref 11.0–14.6)
WBC: 4.5 10*3/uL (ref 4.0–10.3)
lymph#: 1.7 10*3/uL (ref 0.9–3.3)
nRBC: 0 % (ref 0–0)

## 2010-12-07 LAB — PROTIME-INR
INR: 2 (ref 2.00–3.50)
Protime: 24 Seconds — ABNORMAL HIGH (ref 10.6–13.4)

## 2010-12-11 ENCOUNTER — Other Ambulatory Visit: Payer: Self-pay | Admitting: Oncology

## 2010-12-11 ENCOUNTER — Encounter (HOSPITAL_BASED_OUTPATIENT_CLINIC_OR_DEPARTMENT_OTHER): Payer: Medicare Other | Admitting: Oncology

## 2010-12-11 DIAGNOSIS — D689 Coagulation defect, unspecified: Secondary | ICD-10-CM

## 2010-12-11 DIAGNOSIS — Z86718 Personal history of other venous thrombosis and embolism: Secondary | ICD-10-CM

## 2010-12-11 DIAGNOSIS — Z7901 Long term (current) use of anticoagulants: Secondary | ICD-10-CM

## 2010-12-11 LAB — PROTIME-INR
INR: 1.8 — ABNORMAL LOW (ref 2.00–3.50)
Protime: 21.6 Seconds — ABNORMAL HIGH (ref 10.6–13.4)

## 2010-12-12 LAB — URINE CULTURE
Colony Count: NO GROWTH
Culture: NO GROWTH

## 2010-12-12 LAB — PROTIME-INR
INR: 1.25 (ref 0.00–1.49)
Prothrombin Time: 15.6 seconds — ABNORMAL HIGH (ref 11.6–15.2)

## 2010-12-12 LAB — URINALYSIS, ROUTINE W REFLEX MICROSCOPIC
Bilirubin Urine: NEGATIVE
Glucose, UA: NEGATIVE mg/dL
Hgb urine dipstick: NEGATIVE
Ketones, ur: NEGATIVE mg/dL
Leukocytes, UA: NEGATIVE
Nitrite: NEGATIVE
Protein, ur: NEGATIVE mg/dL
Specific Gravity, Urine: 1.023 (ref 1.005–1.030)
Urobilinogen, UA: 1 mg/dL (ref 0.0–1.0)
pH: 7.5 (ref 5.0–8.0)

## 2010-12-12 LAB — APTT: aPTT: 31 seconds (ref 24–37)

## 2010-12-12 LAB — URINE MICROSCOPIC-ADD ON

## 2010-12-14 LAB — URINALYSIS, ROUTINE W REFLEX MICROSCOPIC
Bilirubin Urine: NEGATIVE
Bilirubin Urine: NEGATIVE
Glucose, UA: NEGATIVE mg/dL
Glucose, UA: NEGATIVE mg/dL
Hgb urine dipstick: NEGATIVE
Hgb urine dipstick: NEGATIVE
Ketones, ur: NEGATIVE mg/dL
Ketones, ur: NEGATIVE mg/dL
Nitrite: NEGATIVE
Nitrite: NEGATIVE
Protein, ur: NEGATIVE mg/dL
Protein, ur: NEGATIVE mg/dL
Specific Gravity, Urine: 1.014 (ref 1.005–1.030)
Specific Gravity, Urine: 1.023 (ref 1.005–1.030)
Urobilinogen, UA: 1 mg/dL (ref 0.0–1.0)
Urobilinogen, UA: 1 mg/dL (ref 0.0–1.0)
pH: 8 (ref 5.0–8.0)
pH: 6 (ref 5.0–8.0)

## 2010-12-14 LAB — BASIC METABOLIC PANEL
BUN: 4 mg/dL — ABNORMAL LOW (ref 6–23)
BUN: 8 mg/dL (ref 6–23)
CO2: 29 mEq/L (ref 19–32)
CO2: 31 mEq/L (ref 19–32)
Calcium: 8.9 mg/dL (ref 8.4–10.5)
Calcium: 9.2 mg/dL (ref 8.4–10.5)
Chloride: 101 mEq/L (ref 96–112)
Chloride: 107 mEq/L (ref 96–112)
Creatinine, Ser: 0.69 mg/dL (ref 0.4–1.5)
Creatinine, Ser: 0.85 mg/dL (ref 0.4–1.5)
GFR calc Af Amer: >60 mL/min (ref >60)
GFR calc Af Amer: >60 mL/min (ref >60)
GFR calc non Af Amer: >60 mL/min (ref >60)
GFR calc non Af Amer: >60 mL/min (ref >60)
Glucose, Bld: 114 mg/dL — ABNORMAL HIGH (ref 70–99)
Glucose, Bld: 86 mg/dL (ref 70–99)
Potassium: 3.8 mEq/L (ref 3.5–5.1)
Potassium: 4.3 mEq/L (ref 3.5–5.1)
Sodium: 138 mEq/L (ref 135–145)
Sodium: 139 mEq/L (ref 135–145)

## 2010-12-14 LAB — DIFFERENTIAL
Basophils Absolute: 0.1 10*3/uL (ref 0.0–0.1)
Basophils Relative: 3 % — ABNORMAL HIGH (ref 0–1)
Eosinophils Absolute: 0.1 10*3/uL (ref 0.0–0.7)
Eosinophils Relative: 2 % (ref 0–5)
Lymphocytes Relative: 39 % (ref 12–46)
Lymphs Abs: 1.6 10*3/uL (ref 0.7–4.0)
Monocytes Absolute: 0.3 10*3/uL (ref 0.1–1.0)
Monocytes Relative: 8 % (ref 3–12)
Neutro Abs: 2.1 10*3/uL (ref 1.7–7.7)
Neutrophils Relative %: 48 % (ref 43–77)

## 2010-12-14 LAB — CBC
HCT: 40.9 % (ref 39.0–52.0)
Hemoglobin: 13.1 g/dL (ref 13.0–17.0)
MCHC: 31.9 g/dL (ref 30.0–36.0)
MCV: 73.5 fL — ABNORMAL LOW (ref 78.0–100.0)
Platelets: 201 10*3/uL (ref 150–400)
RBC: 5.57 MIL/uL (ref 4.22–5.81)
RDW: 14.9 % (ref 11.5–15.5)
WBC: 4.2 10*3/uL (ref 4.0–10.5)

## 2010-12-14 LAB — PSA: PSA: 0.39 ng/mL (ref 0.10–4.00)

## 2010-12-14 LAB — PROTIME-INR
INR: 1.4 (ref 0.00–1.49)
Prothrombin Time: 17.2 seconds — ABNORMAL HIGH (ref 11.6–15.2)

## 2010-12-17 LAB — DIFFERENTIAL
Basophils Absolute: 0 10*3/uL (ref 0.0–0.1)
Basophils Relative: 1 % (ref 0–1)
Eosinophils Absolute: 0.1 10*3/uL (ref 0.0–0.7)
Eosinophils Relative: 2 % (ref 0–5)
Lymphocytes Relative: 35 % (ref 12–46)
Lymphs Abs: 1.5 10*3/uL (ref 0.7–4.0)
Monocytes Absolute: 0.3 10*3/uL (ref 0.1–1.0)
Monocytes Relative: 8 % (ref 3–12)
Neutro Abs: 2.3 10*3/uL (ref 1.7–7.7)
Neutrophils Relative %: 55 % (ref 43–77)

## 2010-12-17 LAB — COMPREHENSIVE METABOLIC PANEL
ALT: 10 U/L (ref 0–53)
AST: 12 U/L (ref 0–37)
Albumin: 3.9 g/dL (ref 3.5–5.2)
Alkaline Phosphatase: 42 U/L (ref 39–117)
BUN: 5 mg/dL — ABNORMAL LOW (ref 6–23)
CO2: 26 mEq/L (ref 19–32)
Calcium: 8.9 mg/dL (ref 8.4–10.5)
Chloride: 107 mEq/L (ref 96–112)
Creatinine, Ser: 0.92 mg/dL (ref 0.4–1.5)
GFR calc Af Amer: >60 mL/min (ref >60)
GFR calc non Af Amer: >60 mL/min (ref >60)
Glucose, Bld: 91 mg/dL (ref 70–99)
Potassium: 3.3 mEq/L — ABNORMAL LOW (ref 3.5–5.1)
Sodium: 138 mEq/L (ref 135–145)
Total Bilirubin: 0.7 mg/dL (ref 0.3–1.2)
Total Protein: 6.6 g/dL (ref 6.0–8.3)

## 2010-12-17 LAB — URINALYSIS, ROUTINE W REFLEX MICROSCOPIC
Bilirubin Urine: NEGATIVE
Glucose, UA: NEGATIVE mg/dL
Hgb urine dipstick: NEGATIVE
Ketones, ur: NEGATIVE mg/dL
Nitrite: NEGATIVE
Protein, ur: NEGATIVE mg/dL
Specific Gravity, Urine: 1.007 (ref 1.005–1.030)
Urobilinogen, UA: 0.2 mg/dL (ref 0.0–1.0)
pH: 6.5 (ref 5.0–8.0)

## 2010-12-17 LAB — CBC
HCT: 40.5 % (ref 39.0–52.0)
Hemoglobin: 12.7 g/dL — ABNORMAL LOW (ref 13.0–17.0)
MCHC: 31.5 g/dL (ref 30.0–36.0)
MCV: 72.9 fL — ABNORMAL LOW (ref 78.0–100.0)
Platelets: 226 10*3/uL (ref 150–400)
RBC: 5.56 MIL/uL (ref 4.22–5.81)
RDW: 16 % — ABNORMAL HIGH (ref 11.5–15.5)
WBC: 4.2 10*3/uL (ref 4.0–10.5)

## 2010-12-17 LAB — LIPASE, BLOOD: Lipase: 19 U/L (ref 11–59)

## 2010-12-17 LAB — APTT: aPTT: 45 seconds — ABNORMAL HIGH (ref 24–37)

## 2010-12-17 LAB — LACTIC ACID, PLASMA: Lactic Acid, Venous: 0.8 mmol/L (ref 0.5–2.2)

## 2010-12-17 LAB — PROTIME-INR
INR: 2.7 — ABNORMAL HIGH (ref 0.00–1.49)
Prothrombin Time: 30.6 seconds — ABNORMAL HIGH (ref 11.6–15.2)

## 2010-12-18 LAB — DIFFERENTIAL
Basophils Absolute: 0 10*3/uL (ref 0.0–0.1)
Basophils Relative: 1 % (ref 0–1)
Eosinophils Absolute: 0 10*3/uL (ref 0.0–0.7)
Eosinophils Relative: 1 % (ref 0–5)
Lymphocytes Relative: 33 % (ref 12–46)
Lymphs Abs: 1.3 10*3/uL (ref 0.7–4.0)
Monocytes Absolute: 0.3 10*3/uL (ref 0.1–1.0)
Monocytes Relative: 7 % (ref 3–12)
Neutro Abs: 2.3 10*3/uL (ref 1.7–7.7)
Neutrophils Relative %: 58 % (ref 43–77)

## 2010-12-18 LAB — CBC
HCT: 38.8 % — ABNORMAL LOW (ref 39.0–52.0)
Hemoglobin: 12.4 g/dL — ABNORMAL LOW (ref 13.0–17.0)
MCHC: 32.1 g/dL (ref 30.0–36.0)
MCV: 72.2 fL — ABNORMAL LOW (ref 78.0–100.0)
Platelets: 215 10*3/uL (ref 150–400)
RBC: 5.37 MIL/uL (ref 4.22–5.81)
RDW: 15.8 % — ABNORMAL HIGH (ref 11.5–15.5)
WBC: 3.9 10*3/uL — ABNORMAL LOW (ref 4.0–10.5)

## 2010-12-18 LAB — APTT: aPTT: 31 seconds (ref 24–37)

## 2010-12-18 LAB — BASIC METABOLIC PANEL
BUN: 6 mg/dL (ref 6–23)
CO2: 28 mEq/L (ref 19–32)
Calcium: 9.5 mg/dL (ref 8.4–10.5)
Chloride: 105 mEq/L (ref 96–112)
Creatinine, Ser: 0.83 mg/dL (ref 0.4–1.5)
GFR calc Af Amer: >60 mL/min (ref >60)
GFR calc non Af Amer: >60 mL/min (ref >60)
Glucose, Bld: 99 mg/dL (ref 70–99)
Potassium: 3.7 mEq/L (ref 3.5–5.1)
Sodium: 138 mEq/L (ref 135–145)

## 2010-12-18 LAB — PROTIME-INR
INR: 1.3 (ref 0.00–1.49)
Prothrombin Time: 17.1 seconds — ABNORMAL HIGH (ref 11.6–15.2)

## 2010-12-18 LAB — POCT CARDIAC MARKERS
CKMB, poc: <1 ng/mL — ABNORMAL LOW (ref 1.0–8.0)
Myoglobin, poc: 56.7 ng/mL (ref 12–200)
Troponin i, poc: <0.05 ng/mL (ref 0.00–0.09)

## 2010-12-19 LAB — URINALYSIS, ROUTINE W REFLEX MICROSCOPIC
Bilirubin Urine: NEGATIVE
Bilirubin Urine: NEGATIVE
Glucose, UA: NEGATIVE mg/dL
Glucose, UA: NEGATIVE mg/dL
Hgb urine dipstick: NEGATIVE
Hgb urine dipstick: NEGATIVE
Ketones, ur: NEGATIVE mg/dL
Ketones, ur: NEGATIVE mg/dL
Nitrite: NEGATIVE
Nitrite: NEGATIVE
Protein, ur: NEGATIVE mg/dL
Protein, ur: NEGATIVE mg/dL
Specific Gravity, Urine: 1.015 (ref 1.005–1.030)
Specific Gravity, Urine: 1.015 (ref 1.005–1.030)
Urobilinogen, UA: 1 mg/dL (ref 0.0–1.0)
Urobilinogen, UA: 1 mg/dL (ref 0.0–1.0)
pH: 6.5 (ref 5.0–8.0)
pH: 7.5 (ref 5.0–8.0)

## 2010-12-19 LAB — HEPATIC FUNCTION PANEL
ALT: 20 U/L (ref 0–53)
AST: 26 U/L (ref 0–37)
Albumin: 4.3 g/dL (ref 3.5–5.2)
Alkaline Phosphatase: 47 U/L (ref 39–117)
Bilirubin, Direct: 0.3 mg/dL (ref 0.0–0.3)
Indirect Bilirubin: 1 mg/dL — ABNORMAL HIGH (ref 0.3–0.9)
Total Bilirubin: 1.3 mg/dL — ABNORMAL HIGH (ref 0.3–1.2)
Total Protein: 6.8 g/dL (ref 6.0–8.3)

## 2010-12-19 LAB — PROTIME-INR
INR: 1.1 (ref 0.00–1.49)
INR: 1.3 (ref 0.00–1.49)
INR: 1.3 (ref 0.00–1.49)
INR: 1.4 (ref 0.00–1.49)
INR: 1.5 (ref 0.00–1.49)
INR: 1.6 — ABNORMAL HIGH (ref 0.00–1.49)
INR: 1.7 — ABNORMAL HIGH (ref 0.00–1.49)
INR: 1.8 — ABNORMAL HIGH (ref 0.00–1.49)
Prothrombin Time: 14.9 seconds (ref 11.6–15.2)
Prothrombin Time: 16.1 seconds — ABNORMAL HIGH (ref 11.6–15.2)
Prothrombin Time: 16.6 seconds — ABNORMAL HIGH (ref 11.6–15.2)
Prothrombin Time: 18.2 seconds — ABNORMAL HIGH (ref 11.6–15.2)
Prothrombin Time: 19.1 seconds — ABNORMAL HIGH (ref 11.6–15.2)
Prothrombin Time: 19.4 seconds — ABNORMAL HIGH (ref 11.6–15.2)
Prothrombin Time: 20.8 seconds — ABNORMAL HIGH (ref 11.6–15.2)
Prothrombin Time: 21.8 seconds — ABNORMAL HIGH (ref 11.6–15.2)

## 2010-12-19 LAB — BASIC METABOLIC PANEL
BUN: 8 mg/dL (ref 6–23)
BUN: 9 mg/dL (ref 6–23)
BUN: 14 mg/dL (ref 6–23)
CO2: 30 mEq/L (ref 19–32)
CO2: 34 mEq/L — ABNORMAL HIGH (ref 19–32)
CO2: 27 mEq/L (ref 19–32)
Calcium: 9.2 mg/dL (ref 8.4–10.5)
Calcium: 9.4 mg/dL (ref 8.4–10.5)
Calcium: 9.6 mg/dL (ref 8.4–10.5)
Chloride: 101 mEq/L (ref 96–112)
Chloride: 106 mEq/L (ref 96–112)
Chloride: 99 mEq/L (ref 96–112)
Creatinine, Ser: 0.82 mg/dL (ref 0.4–1.5)
Creatinine, Ser: 1.02 mg/dL (ref 0.4–1.5)
Creatinine, Ser: 0.98 mg/dL (ref 0.4–1.5)
GFR calc Af Amer: >60 mL/min (ref >60)
GFR calc Af Amer: >60 mL/min (ref >60)
GFR calc Af Amer: >60 mL/min (ref >60)
GFR calc non Af Amer: >60 mL/min (ref >60)
GFR calc non Af Amer: >60 mL/min (ref >60)
GFR calc non Af Amer: >60 mL/min (ref >60)
Glucose, Bld: 85 mg/dL (ref 70–99)
Glucose, Bld: 90 mg/dL (ref 70–99)
Glucose, Bld: 95 mg/dL (ref 70–99)
Potassium: 3.5 mEq/L (ref 3.5–5.1)
Potassium: 4 mEq/L (ref 3.5–5.1)
Potassium: 5.1 mEq/L (ref 3.5–5.1)
Sodium: 139 mEq/L (ref 135–145)
Sodium: 141 mEq/L (ref 135–145)
Sodium: 134 mEq/L — ABNORMAL LOW (ref 135–145)

## 2010-12-19 LAB — CK TOTAL AND CKMB (NOT AT ARMC)
CK, MB: 1.9 ng/mL (ref 0.3–4.0)
CK, MB: 2 ng/mL (ref 0.3–4.0)
CK, MB: 2.5 ng/mL (ref 0.3–4.0)
CK, MB: 1.9 ng/mL (ref 0.3–4.0)
Relative Index: 1.1 (ref 0.0–2.5)
Relative Index: 1.1 (ref 0.0–2.5)
Relative Index: 1.2 (ref 0.0–2.5)
Relative Index: 1.2 (ref 0.0–2.5)
Total CK: 170 U/L (ref 7–232)
Total CK: 182 U/L (ref 7–232)
Total CK: 211 U/L (ref 7–232)
Total CK: 159 U/L (ref 7–232)

## 2010-12-19 LAB — COMPREHENSIVE METABOLIC PANEL
ALT: 128 U/L — ABNORMAL HIGH (ref 0–53)
ALT: 210 U/L — ABNORMAL HIGH (ref 0–53)
ALT: 70 U/L — ABNORMAL HIGH (ref 0–53)
ALT: 89 U/L — ABNORMAL HIGH (ref 0–53)
AST: 25 U/L (ref 0–37)
AST: 34 U/L (ref 0–37)
AST: 345 U/L — ABNORMAL HIGH (ref 0–37)
AST: 62 U/L — ABNORMAL HIGH (ref 0–37)
Albumin: 3.5 g/dL (ref 3.5–5.2)
Albumin: 3.8 g/dL (ref 3.5–5.2)
Albumin: 3.8 g/dL (ref 3.5–5.2)
Albumin: 3.8 g/dL (ref 3.5–5.2)
Alkaline Phosphatase: 51 U/L (ref 39–117)
Alkaline Phosphatase: 53 U/L (ref 39–117)
Alkaline Phosphatase: 60 U/L (ref 39–117)
Alkaline Phosphatase: 67 U/L (ref 39–117)
BUN: 10 mg/dL (ref 6–23)
BUN: 10 mg/dL (ref 6–23)
BUN: 12 mg/dL (ref 6–23)
BUN: 9 mg/dL (ref 6–23)
CO2: 28 mEq/L (ref 19–32)
CO2: 29 mEq/L (ref 19–32)
CO2: 30 mEq/L (ref 19–32)
CO2: 31 mEq/L (ref 19–32)
Calcium: 8.9 mg/dL (ref 8.4–10.5)
Calcium: 9.2 mg/dL (ref 8.4–10.5)
Calcium: 9.4 mg/dL (ref 8.4–10.5)
Calcium: 9.4 mg/dL (ref 8.4–10.5)
Chloride: 101 mEq/L (ref 96–112)
Chloride: 103 mEq/L (ref 96–112)
Chloride: 106 mEq/L (ref 96–112)
Chloride: 98 mEq/L (ref 96–112)
Creatinine, Ser: 0.93 mg/dL (ref 0.4–1.5)
Creatinine, Ser: 0.97 mg/dL (ref 0.4–1.5)
Creatinine, Ser: 0.98 mg/dL (ref 0.4–1.5)
Creatinine, Ser: 1.03 mg/dL (ref 0.4–1.5)
GFR calc Af Amer: >60 mL/min (ref >60)
GFR calc Af Amer: >60 mL/min (ref >60)
GFR calc Af Amer: >60 mL/min (ref >60)
GFR calc Af Amer: >60 mL/min (ref >60)
GFR calc non Af Amer: >60 mL/min (ref >60)
GFR calc non Af Amer: >60 mL/min (ref >60)
GFR calc non Af Amer: >60 mL/min (ref >60)
GFR calc non Af Amer: >60 mL/min (ref >60)
Glucose, Bld: 89 mg/dL (ref 70–99)
Glucose, Bld: 97 mg/dL (ref 70–99)
Glucose, Bld: 98 mg/dL (ref 70–99)
Glucose, Bld: 99 mg/dL (ref 70–99)
Potassium: 3.7 mEq/L (ref 3.5–5.1)
Potassium: 3.8 mEq/L (ref 3.5–5.1)
Potassium: 3.9 mEq/L (ref 3.5–5.1)
Potassium: 4.3 mEq/L (ref 3.5–5.1)
Sodium: 135 mEq/L (ref 135–145)
Sodium: 138 mEq/L (ref 135–145)
Sodium: 138 mEq/L (ref 135–145)
Sodium: 141 mEq/L (ref 135–145)
Total Bilirubin: 0.5 mg/dL (ref 0.3–1.2)
Total Bilirubin: 0.6 mg/dL (ref 0.3–1.2)
Total Bilirubin: 0.7 mg/dL (ref 0.3–1.2)
Total Bilirubin: 1.4 mg/dL — ABNORMAL HIGH (ref 0.3–1.2)
Total Protein: 5.7 g/dL — ABNORMAL LOW (ref 6.0–8.3)
Total Protein: 6.2 g/dL (ref 6.0–8.3)
Total Protein: 6.4 g/dL (ref 6.0–8.3)
Total Protein: 6.7 g/dL (ref 6.0–8.3)

## 2010-12-19 LAB — CBC
HCT: 36.2 % — ABNORMAL LOW (ref 39.0–52.0)
HCT: 36.7 % — ABNORMAL LOW (ref 39.0–52.0)
HCT: 39.2 % (ref 39.0–52.0)
HCT: 40.5 % (ref 39.0–52.0)
HCT: 41 % (ref 39.0–52.0)
HCT: 43.7 % (ref 39.0–52.0)
Hemoglobin: 11.8 g/dL — ABNORMAL LOW (ref 13.0–17.0)
Hemoglobin: 12.1 g/dL — ABNORMAL LOW (ref 13.0–17.0)
Hemoglobin: 12.8 g/dL — ABNORMAL LOW (ref 13.0–17.0)
Hemoglobin: 13.2 g/dL (ref 13.0–17.0)
Hemoglobin: 13.3 g/dL (ref 13.0–17.0)
Hemoglobin: 14.4 g/dL (ref 13.0–17.0)
MCHC: 32.5 g/dL (ref 30.0–36.0)
MCHC: 32.5 g/dL (ref 30.0–36.0)
MCHC: 32.6 g/dL (ref 30.0–36.0)
MCHC: 32.7 g/dL (ref 30.0–36.0)
MCHC: 32.9 g/dL (ref 30.0–36.0)
MCHC: 32.8 g/dL (ref 30.0–36.0)
MCV: 71.9 fL — ABNORMAL LOW (ref 78.0–100.0)
MCV: 71.9 fL — ABNORMAL LOW (ref 78.0–100.0)
MCV: 71.9 fL — ABNORMAL LOW (ref 78.0–100.0)
MCV: 72 fL — ABNORMAL LOW (ref 78.0–100.0)
MCV: 72.4 fL — ABNORMAL LOW (ref 78.0–100.0)
MCV: 72.2 fL — ABNORMAL LOW (ref 78.0–100.0)
Platelets: 200 10*3/uL (ref 150–400)
Platelets: 214 10*3/uL (ref 150–400)
Platelets: 223 10*3/uL (ref 150–400)
Platelets: 233 10*3/uL (ref 150–400)
Platelets: 236 10*3/uL (ref 150–400)
Platelets: 263 10*3/uL (ref 150–400)
RBC: 5.02 MIL/uL (ref 4.22–5.81)
RBC: 5.11 MIL/uL (ref 4.22–5.81)
RBC: 5.41 MIL/uL (ref 4.22–5.81)
RBC: 5.64 MIL/uL (ref 4.22–5.81)
RBC: 5.7 MIL/uL (ref 4.22–5.81)
RBC: 6.06 MIL/uL — ABNORMAL HIGH (ref 4.22–5.81)
RDW: 15.2 % (ref 11.5–15.5)
RDW: 15.6 % — ABNORMAL HIGH (ref 11.5–15.5)
RDW: 15.7 % — ABNORMAL HIGH (ref 11.5–15.5)
RDW: 15.9 % — ABNORMAL HIGH (ref 11.5–15.5)
RDW: 16.3 % — ABNORMAL HIGH (ref 11.5–15.5)
RDW: 15.9 % — ABNORMAL HIGH (ref 11.5–15.5)
WBC: 3.3 10*3/uL — ABNORMAL LOW (ref 4.0–10.5)
WBC: 3.7 10*3/uL — ABNORMAL LOW (ref 4.0–10.5)
WBC: 3.9 10*3/uL — ABNORMAL LOW (ref 4.0–10.5)
WBC: 4.3 10*3/uL (ref 4.0–10.5)
WBC: 4.6 10*3/uL (ref 4.0–10.5)
WBC: 4.8 10*3/uL (ref 4.0–10.5)

## 2010-12-19 LAB — CARDIAC PANEL(CRET KIN+CKTOT+MB+TROPI)
CK, MB: 3.3 ng/mL (ref 0.3–4.0)
CK, MB: 3.5 ng/mL (ref 0.3–4.0)
CK, MB: 3.6 ng/mL (ref 0.3–4.0)
Relative Index: 1.3 (ref 0.0–2.5)
Relative Index: 1.3 (ref 0.0–2.5)
Relative Index: 1.3 (ref 0.0–2.5)
Total CK: 245 U/L — ABNORMAL HIGH (ref 7–232)
Total CK: 277 U/L — ABNORMAL HIGH (ref 7–232)
Total CK: 281 U/L — ABNORMAL HIGH (ref 7–232)
Troponin I: 0.01 ng/mL (ref 0.00–0.06)
Troponin I: 0.01 ng/mL (ref 0.00–0.06)
Troponin I: 0.02 ng/mL (ref 0.00–0.06)

## 2010-12-19 LAB — DIFFERENTIAL
Basophils Absolute: 0 10*3/uL (ref 0.0–0.1)
Basophils Relative: 1 % (ref 0–1)
Eosinophils Absolute: 0 10*3/uL (ref 0.0–0.7)
Eosinophils Relative: 1 % (ref 0–5)
Lymphocytes Relative: 34 % (ref 12–46)
Lymphs Abs: 1.6 10*3/uL (ref 0.7–4.0)
Monocytes Absolute: 0.4 10*3/uL (ref 0.1–1.0)
Monocytes Relative: 8 % (ref 3–12)
Neutro Abs: 2.7 10*3/uL (ref 1.7–7.7)
Neutrophils Relative %: 57 % (ref 43–77)

## 2010-12-19 LAB — URINE CULTURE
Colony Count: NO GROWTH
Culture: NO GROWTH
Special Requests: NEGATIVE

## 2010-12-19 LAB — TROPONIN I
Troponin I: 0.01 ng/mL (ref 0.00–0.06)
Troponin I: 0.01 ng/mL (ref 0.00–0.06)
Troponin I: 0.02 ng/mL (ref 0.00–0.06)

## 2010-12-19 LAB — MAGNESIUM: Magnesium: 1.8 mg/dL (ref 1.5–2.5)

## 2010-12-19 LAB — LIPASE, BLOOD: Lipase: 28 U/L (ref 11–59)

## 2010-12-19 LAB — PHOSPHORUS: Phosphorus: 4.1 mg/dL (ref 2.3–4.6)

## 2010-12-25 ENCOUNTER — Emergency Department (HOSPITAL_COMMUNITY): Payer: Medicare Other

## 2010-12-25 ENCOUNTER — Emergency Department (HOSPITAL_COMMUNITY)
Admission: EM | Admit: 2010-12-25 | Discharge: 2010-12-25 | Disposition: A | Discharge status: Home / Self Care | Payer: Medicare Other | Attending: Emergency Medicine | Admitting: Emergency Medicine

## 2010-12-25 DIAGNOSIS — F329 Major depressive disorder, single episode, unspecified: Secondary | ICD-10-CM | POA: Insufficient documentation

## 2010-12-25 DIAGNOSIS — Z7901 Long term (current) use of anticoagulants: Secondary | ICD-10-CM | POA: Insufficient documentation

## 2010-12-25 DIAGNOSIS — R1032 Left lower quadrant pain: Secondary | ICD-10-CM | POA: Insufficient documentation

## 2010-12-25 DIAGNOSIS — I10 Essential (primary) hypertension: Secondary | ICD-10-CM | POA: Insufficient documentation

## 2010-12-25 DIAGNOSIS — K5900 Constipation, unspecified: Secondary | ICD-10-CM | POA: Insufficient documentation

## 2010-12-25 DIAGNOSIS — F3289 Other specified depressive episodes: Secondary | ICD-10-CM | POA: Insufficient documentation

## 2010-12-25 LAB — URINALYSIS, ROUTINE W REFLEX MICROSCOPIC
Bilirubin Urine: NEGATIVE
Glucose, UA: NEGATIVE mg/dL
Hgb urine dipstick: NEGATIVE
Ketones, ur: NEGATIVE mg/dL
Nitrite: NEGATIVE
Protein, ur: NEGATIVE mg/dL
Specific Gravity, Urine: 1.023 (ref 1.005–1.030)
Urobilinogen, UA: 0.2 mg/dL (ref 0.0–1.0)
pH: 6.5 (ref 5.0–8.0)

## 2010-12-25 LAB — COMPREHENSIVE METABOLIC PANEL
ALT: 17 U/L (ref 0–53)
AST: 23 U/L (ref 0–37)
Albumin: 4.3 g/dL (ref 3.5–5.2)
Alkaline Phosphatase: 54 U/L (ref 39–117)
BUN: 7 mg/dL (ref 6–23)
CO2: 30 mEq/L (ref 19–32)
Calcium: 9.7 mg/dL (ref 8.4–10.5)
Chloride: 101 mEq/L (ref 96–112)
Creatinine, Ser: 1.12 mg/dL (ref 0.4–1.5)
GFR calc Af Amer: >60 mL/min (ref >60)
GFR calc non Af Amer: >60 mL/min (ref >60)
Glucose, Bld: 111 mg/dL — ABNORMAL HIGH (ref 70–99)
Potassium: 5 mEq/L (ref 3.5–5.1)
Sodium: 139 mEq/L (ref 135–145)
Total Bilirubin: 0.6 mg/dL (ref 0.3–1.2)
Total Protein: 7.3 g/dL (ref 6.0–8.3)

## 2010-12-25 LAB — LIPASE, BLOOD: Lipase: 23 U/L (ref 11–59)

## 2010-12-25 LAB — DIFFERENTIAL
Basophils Absolute: 0 10*3/uL (ref 0.0–0.1)
Basophils Relative: 0 % (ref 0–1)
Eosinophils Absolute: 0 10*3/uL (ref 0.0–0.7)
Eosinophils Relative: 1 % (ref 0–5)
Lymphocytes Relative: 38 % (ref 12–46)
Lymphs Abs: 1.6 10*3/uL (ref 0.7–4.0)
Monocytes Absolute: 0.4 10*3/uL (ref 0.1–1.0)
Monocytes Relative: 10 % (ref 3–12)
Neutro Abs: 2.2 10*3/uL (ref 1.7–7.7)
Neutrophils Relative %: 51 % (ref 43–77)

## 2010-12-25 LAB — POCT CARDIAC MARKERS
CKMB, poc: 1.1 ng/mL (ref 1.0–8.0)
Myoglobin, poc: 70.3 ng/mL (ref 12–200)
Troponin i, poc: <0.05 ng/mL (ref 0.00–0.09)

## 2010-12-25 LAB — CBC
HCT: 43.6 % (ref 39.0–52.0)
Hemoglobin: 14.8 g/dL (ref 13.0–17.0)
MCH: 23.4 pg — ABNORMAL LOW (ref 26.0–34.0)
MCHC: 33.9 g/dL (ref 30.0–36.0)
MCV: 69 fL — ABNORMAL LOW (ref 78.0–100.0)
Platelets: 228 10*3/uL (ref 150–400)
RBC: 6.32 MIL/uL — ABNORMAL HIGH (ref 4.22–5.81)
RDW: 15.8 % — ABNORMAL HIGH (ref 11.5–15.5)
WBC: 4.2 10*3/uL (ref 4.0–10.5)

## 2010-12-25 LAB — PROTIME-INR
INR: 1.89 — ABNORMAL HIGH (ref 0.00–1.49)
Prothrombin Time: 21.9 seconds — ABNORMAL HIGH (ref 11.6–15.2)

## 2010-12-25 MED ORDER — IOHEXOL 300 MG/ML  SOLN
100.0000 mL | Freq: Once | INTRAMUSCULAR | Status: AC | PRN
  Administered 2010-12-25: 100 mL via INTRAVENOUS

## 2010-12-26 LAB — URINE CULTURE
Colony Count: NO GROWTH
Culture  Setup Time: 201204180008
Culture: NO GROWTH

## 2011-01-04 ENCOUNTER — Encounter (HOSPITAL_BASED_OUTPATIENT_CLINIC_OR_DEPARTMENT_OTHER): Payer: Medicare Other | Admitting: Oncology

## 2011-01-04 ENCOUNTER — Other Ambulatory Visit: Payer: Self-pay | Admitting: Oncology

## 2011-01-04 DIAGNOSIS — Z86718 Personal history of other venous thrombosis and embolism: Secondary | ICD-10-CM

## 2011-01-04 DIAGNOSIS — D689 Coagulation defect, unspecified: Secondary | ICD-10-CM

## 2011-01-04 DIAGNOSIS — Z7901 Long term (current) use of anticoagulants: Secondary | ICD-10-CM

## 2011-01-04 LAB — PROTIME-INR
INR: 1.6 — ABNORMAL LOW (ref 2.00–3.50)
Protime: 19.2 Seconds — ABNORMAL HIGH (ref 10.6–13.4)

## 2011-01-14 ENCOUNTER — Other Ambulatory Visit: Payer: Self-pay | Admitting: Oncology

## 2011-01-14 ENCOUNTER — Encounter (HOSPITAL_BASED_OUTPATIENT_CLINIC_OR_DEPARTMENT_OTHER): Payer: Medicare Other | Admitting: Oncology

## 2011-01-14 DIAGNOSIS — D689 Coagulation defect, unspecified: Secondary | ICD-10-CM

## 2011-01-14 DIAGNOSIS — Z86718 Personal history of other venous thrombosis and embolism: Secondary | ICD-10-CM

## 2011-01-14 DIAGNOSIS — Z7901 Long term (current) use of anticoagulants: Secondary | ICD-10-CM

## 2011-01-14 LAB — PROTIME-INR
INR: 2.5 (ref 2.00–3.50)
Protime: 30 Seconds — ABNORMAL HIGH (ref 10.6–13.4)

## 2011-01-22 ENCOUNTER — Other Ambulatory Visit (HOSPITAL_COMMUNITY): Payer: Self-pay | Admitting: Urology

## 2011-01-22 DIAGNOSIS — N50819 Testicular pain, unspecified: Secondary | ICD-10-CM

## 2011-01-22 NOTE — Group Therapy Note (Signed)
NAME:  [name] [name], [name] [name]                ACCOUNT NO.:  [id number]   MEDICAL RECORD NO.:  [id number]          PATIENT TYPE:  INP   LOCATION:  1402                         FACILITY:  [facility]   PHYSICIAN:  Theodosia Paling, MD    DATE OF BIRTH:  1951/05/05                                 PROGRESS NOTE   PRIMARY CARE PHYSICIAN:  The patient does not have a PCP at this time.   DISCHARGE DIAGNOSES:  1. Chest pain currently undergoing workup for pulmonary embolism at      this time.  2. Acute deep venous thrombosis right-sided, status post right      [place] filter placed in 1990.  On Coumadin and currently      treated with Lovenox.  3. Postoperative abdominal pain.  4. Hypertension.   DISCHARGE MEDICATIONS:  To be determined by discharging physician.   IMAGING PERFORMED:  1. On December 22, 2008 CT abdomen and pelvis performed without contrast      showing right iliac vein filter.  There is no intra-abdominal or      pelvic process going on.  2. X-ray of the abdomen prior to original admission; no acute      abdominal finding.  3. Ultrasound of the kidneys; normal kidneys.  4. Hepatobiliary HIDA scan performed on December 23, 2008, was normal for      no evidence of bile leak.  5. MRCP performed on December 26, 2008 showing evidence of bile leak or      complication following cholecystectomy.   HOSPITAL COURSE:  The following issues were addressed during hospitalization:  1. Chest pain.  Today the patient was reporting chest pain.  Given      that he has this acute DVT was felt due to subtherapeutic INR while      he was on Coumadin.  A CTA is performed which is reading at this      time to look for PE.  The patient has some psychosomatic complaints      as he hurts almost everywhere, however, given his high risk of PE,      I would like to definitely rule out that.  His cardiac enzymes have      been negative as he had chest pain a few days back and at that time      his cardiac enzymes were  negative and his telemetry was negative as      well.  2. Acute right-sided DVT.  The patient had a  history of PE and DVT in      1990 and that is when he got a right iliac  filter placed and he      was given Coumadin.  According to him, his INR has been very      challenging, very difficult to manage.  He was living in [place],      moved to [place] recently.  He has a problem with back pain and      he has undergone several epidural procedures for which the Coumadin      will be held for some duration.  Three months later he had another      episode of when he was found to have distal DVT.  Around the week      that he has gone back to [place] where he was found to be      supratherapeutic on Coumadin with INR around 5 or 6 and therefore      the Coumadin dose was recently deceased.  Last week when he came on      admission his INR was subtherapeutic which is most likely the cause      of the acute DVT.  At this time the patient is treated with Lovenox      and we are continuing Coumadin.  3. Hypertension.  The patient was having hypertensive urgency,      therefore, I have increased today the dose of hydrochlorothiazide      and added hydralazine p.r.n.  4. GERD.  PPI is continued.  5. Status post cholecystectomy/abdominal pain.  The patient initially      presented for abdominal pain following cholecystectomy.  He has      undergone extensive evaluation.  General surgery consult was also      done by Dr. Corliss Skains and he had undergone HIDA scan, MRCP and all of      them have failed to reveal any pathology.  He did have some      jaundice on the night of admission which has resolved and his LFTs      are back to normal which may suggest he may have some migrating      obstruction in terms of stones or sludge which is already passed.      He has no fever, leukocytosis suggestive of infection or      obstruction.   CONSULTATIONS:  The patient has undergone evaluation by general  surgery, Dr. Corliss Skains, on  December 22, 2008, status post cholecystectomy.  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  He may  have some particles which may have passed through the kidneys and may  have caused him some pain.  However, he did not clinically look like he  had renal colicky pain and his urine was not positive for blood.   PROCEDURE:  None.   DISPOSITION:  At this time we are awaiting blood pressure control and results of the  CT scan for PE.  If the PE study comes back negative and the blood  pressure gets controlled on above medication he can be discharged home.  However, he will need to set up a primary care physician with Home  Health.  Coumadin follow-up needs to be started and INR needs to be  regularly closely followed versus he may need lifelong anticoagulation  with Lovenox given his tendency for recurrent DVT and difficult to  manage INR for this hematology analysis can be explained.  However, I  did talk to hematology, Dr. Myna Hidalgo, who was oncall and he recommended  to continue with Coumadin given that he sustained this DVT with a  subtherapeutic INR, so he would need very close follow-up of his INR as  an outpatient.   Total time spent was 20 minutes.      Theodosia Paling, MD  Electronically Signed     NP/MEDQ  D:  12/27/2008  T:  12/27/2008  Job:  [id number]

## 2011-01-22 NOTE — Consult Note (Signed)
NAMEDEWAIN, PLATZ                ACCOUNT NO.:  [id number]   MEDICAL RECORD NO.:  [id number]          PATIENT TYPE:  INP   LOCATION:  1315                         FACILITY:  [facility]   PHYSICIAN:  Wilmon Arms. Corliss Skains, M.D. DATE OF BIRTH:  04-03-1951   DATE OF CONSULTATION:  12/22/2008  DATE OF DISCHARGE:                                 CONSULTATION   REFERRING PHYSICIAN:  Rhae Lerner. Margretta Ditty, M.D.   REASON FOR CONSULTATION:  Right flank pain, status post laparoscopic  cholecystectomy.   HISTORY OF PRESENT ILLNESS:  This is a 60 year old male who is 2 or 3  weeks status post an emergent laparoscopic cholecystectomy for acute  cholecystitis.  This was performed in [place], [place].  The patient  recently moved to [facility] and has not established medical care here.  He has been having right flank pain for some time.  He states that this  was even present prior to his cholecystectomy.  He continues to have  increasing right flank pain as well as nausea.  He apparently spoke with  his surgeon by phone, who has him set up to see a urologist back in  [place].  The patient states that the pain today was too bad for him  to drive to [place]; so he comes to the emergency room today.  A CT  scan shows some postoperative changes, but no sign of abscess or  abnormal fluid collections or bleeding.  He is being admitted by  [facility], but we are asked to consult regarding his  postoperative pain.   PAST MEDICAL HISTORY:  1. Hypertension.  2. History of pulmonary embolism and DVT, on chronic anticoagulation.  3. Chronic lower extremity muscle spasms  4. Prostatitis.  5. Depression.   PAST SURGICAL HISTORY:  1. Appendectomy.  2. Laparoscopic cholecystectomy.  3. Right lower extremity sympathectomy.  4. Open hiatal hernia repair.   ALLERGIES:  ASPIRIN, LYRICA and DARVOCET.   MEDICATIONS:  1. Hyzaar.  2. Coumadin.  3. Skelaxin.  4. Doxycycline.  5. Cymbalta.   SOCIAL  HISTORY:  He is a smoker; he does not drink.   PHYSICAL EXAMINATION:  Temperature 98.4, blood pressure 134/76, pulse  80, respirations 18, saturations 98%.  This is a well-developed, well-  nourished male in no apparent distress.  HEENT:  EOMI.  Sclerae are anicteric.  NECK:  No mass or thyromegaly.  LUNGS:  Clear.  Normal respiratory effort.  HEART:  Regular rate and rhythm.  No murmur.  ABDOMEN:  Soft.  No anterior abdominal tenderness.  There is a vertical  midline incision at the right lower quadrant, and right lower flank  incisions are well-healed with no sign of hernia or infection.  In his  upper right flank he is tender.  No masses seen.   LABS:  White count 4.8, hemoglobin 14.4, platelet count 263.  Electrolytes within normal limits.  Liver function tests:  Total  bilirubin 1.3, direct bilirubin 0.3, alkaline phosphatase 47, AST 26,  ALT 20, lipase 28.   IMPRESSION:  No sign of postoperative complications.  Right flank pain  possibly urologic pain  versus some other etiology (pulmonary embolism,  pneumonia, etc.).   RECOMMENDATIONS:  Would repeat liver function tests in the morning.  We  will also check his INR as he is on Coumadin.  We will follow with you.      Wilmon Arms. Tsuei, M.D.  Electronically Signed     MKT/MEDQ  D:  12/22/2008  T:  12/22/2008  Job:  [id number]

## 2011-01-22 NOTE — H&P (Signed)
NAME:  [name] [name]                ACCOUNT NO.:  [id number]   MEDICAL RECORD NO.:  [id number]          PATIENT TYPE:  EMS   LOCATION:  ED                           FACILITY:  [facility]   PHYSICIAN:  Lucita Ferrara, MD         DATE OF BIRTH:  07-01-1951   DATE OF ADMISSION:  12/22/2008  DATE OF DISCHARGE:  LH                              HISTORY & PHYSICAL   CHIEF COMPLAINT:  Abdominal pain.   The patient is a 60 year old African American male with right lower  quadrant abdominal pain plus flank pain, severe in nature, started about  1:00 p.m., associated with some nausea, no vomiting.  The patient has  had a recent cholecystectomy 3 weeks ago which was uncomplicated through  laparoscopic pathway.  The patient had a normal peri, intraoperative and  postoperative course.  He denied any fevers or chills.  In the emergency  room, he had a workup.  CT scan of the abdomen showed no ureteral  calculi, right common iliac vein filter in place.  The patient's white  count and liver function tests within normal limits.  Lipase and amylase  normal.  CBC normal.  BMET normal.  Urinalysis normal.  The patient is  actually hungry at this point.   PAST MEDICAL HISTORY:  1. Hypertension.  2. History of pulmonary emboli and deep vein thrombosis, status post      filter.   PAST SURGICAL HISTORY:  1. Filter insertion.  2. Status post cholecystectomy.  3. Status post hernia repair.   ALLERGIES:  ALLERGIC TO ASPIRIN, DARVOCET, AND LYRICA.   FAMILY HISTORY:  Significant for coronary artery disease in mother.  Diabetes in father.   MEDICATIONS AT HOME:  1. Hyzaar 100/125 once daily.  2. Coumadin for goal INR between 2-3.  3. Skelaxin 800 mg p.o. as needed.  4. Doxycycline 100 grams p.o. once daily.  5. Cymbalta 60 mg p.o. once daily.   REVIEW OF SYSTEMS:  Twelve points otherwise negative and per HPI.   PHYSICAL EXAMINATION:  GENERAL:  The patient is in no acute distress.  VITAL SIGNS:  Blood  pressure is 133/87, pulse 81, respirations 18,  temperature 97.8.  HEENT:  Normocephalic, atraumatic.  Sclerae nonicteric.  NECK:  Supple.  No JVD, no carotid bruits.  LUNGS:  Clear to auscultation bilaterally.  No rhonchi, rales or  wheezing.  ABDOMEN:  Soft, nontender, nondistended.  Positive bowel sounds.  EXTREMITIES:  No clubbing, cyanosis or edema.  NEURO:  Patient is alert and oriented x3.  Cranial nerves II-XII grossly  intact.  SKIN:  Normal.   LABORATORY RESULTS:  As above.  CT as above.  CBC, basic metabolic  panel, liver function tests, urinalysis all within normal limits.   The patient is a 60 year old with nonspecific abdominal pain.  CT scan  normal, workup so far normal.  He is status post laparoscopic  cholecystectomy.   ASSESSMENT:  1. Abdominal pain.  2. Status post non-complicated laparoscopic cholecystectomy, normal      liver function tests, normal CT scan of abdomen and pelvis.  3.  History of the deep venous thrombosis on chronic Coumadin therapy.      He had a filter placed already.  4. Hypertension.   DISCUSSION AND PLAN:  The patient will be admitted to the medical floor.  The patient will be initiated on intravenous hydration, IV antiemetics  and pain control.  We will start the patient on a clear liquid diet and  advance as tolerated.  Surgery has already been consulted and their  recommendations will be followed.  The patient had a history of  pulmonary embolism.  We will continue Coumadin for now.  The rest of the  plans are dependent on his progress.      Lucita Ferrara, MD  Electronically Signed     RR/MEDQ  D:  12/22/2008  T:  12/22/2008  Job:  [id number]

## 2011-01-22 NOTE — Discharge Summary (Signed)
NAMEMarland Kitchen  [name] [name], [name] [name]                ACCOUNT NO.:  [id number]   MEDICAL RECORD NO.:  [id number]          PATIENT TYPE:  INP   LOCATION:  1402                         FACILITY:  [facility]   PHYSICIAN:  Marcellus Scott, MD     DATE OF BIRTH:  [birth year]   DATE OF ADMISSION:  12/22/2008  DATE OF DISCHARGE:  12/30/2008                               DISCHARGE SUMMARY   PRIMARY MEDICAL DOCTOR:  Gentry Fitz.  He will, however, see Dr. Lonia Blood, who will be his primary M.D. after discharge.   This is an addendum to the interim discharge summary that was done by  Dr. Glade Lloyd on December 27, 2008 and will update events since.   DISCHARGE DIAGNOSES:  1. 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  2. Hypertension.  3. Chronic abdominal pain.  4. Tobacco abuse.  5. Constipation.  6. Chest pain, workup negative.   DISCHARGE MEDICATIONS:  1. Hyzaar 25/100, 1 tablet p.o. daily.  2. Cymbalta 60 mg p.o. daily.  3. Lovenox 110 mg subcutaneously b.i.d.  This is to be taken for 48      hours after the INR is greater than 2 and then has to be      discontinued by primary M.D.  Total of 10 doses supplied.  4. Coumadin 5 mg tablet, 2.5 tablets, that is 12.5 mg, p.o. at 6:00      p.m. daily from December 31, 2008.  5. Metoprolol 25 mg p.o. b.i.d.  6. Senna one p.o. b.i.d.  7. Oxycodone IR 5-mg tablet, one to two p.o. q.6h p.r.n. for pain.      #30 tablets dispensed.   DISCONTINUED MEDICATIONS.:  1. Skelaxin.  2. Doxycycline.   PROCEDURES SINCE APRIL 20:  A CT angiogram of the chest.  Impression:  No pulmonary emboli or acute abnormality.   PERTINENT LABS:  Urine culture:  No growth.  INR today 1.8.  Basic  metabolic panel yesterday unremarkable with BUN 9, creatinine 1.02.  Urinalysis with no features suggestive of urinary tract infection.  CBC  on April 21 with hemoglobin 12.1, hematocrit 36.7, white blood cell 4.3,  platelets 200.  Cardiac panel not suggestive of acute coronary syndrome.  Hepatic panel on April 19 only remarkable for ALT of 70, otherwise  within normal limits.   NEW CONSULTATIONS:  None.   HOSPITAL COURSE AND PATIENT DISPOSITION:  Since April 21, the patient  complained of bilateral lower extremity pains, right lower than the  left, and abdominal pain with intermittent episodes of incomplete  voiding.  His blood pressures were elevated in the 150s/high 90s range.  His lower extremities revealed swelling of the right lower extremity,  mildly.  His abdominal examinations have basically been nondistended and  soft.  Today there is some subjective nonconsistent tenderness.  Hernial  orifices look okay.  The patient did complain of constipation, which  seemed to have improved with stool softeners.  Post-void residuals were  done which were not significant and his urinalyses were not suggestive  of urinary  tract infection.  Unclear at this time what the cause of the  patient's abdominal pain.  He has been extensively evaluated.  Recommend  an outpatient GU evaluation to see if prostatitis or any other pathology  can be identified.   The patient has not complained of any further chest pains or difficulty  breathing.  As indicated above, his Pulmonart Embolism workup was  negative.  His INR is slow to climb.  The patient will be discharged on  Lovenox bridging and Coumadin.  He received 12.5 mg of Coumadin on the  last 2 days and 15 mg this morning.  His Lovenox needs to be continued  for 48 hours after his INR is greater than 2, then it has to be  discontinued.  Today he is day eight of the Lovenox/Coumadin overlap.  Again, as recommended by the chest guidelines, Lovenox has to be  discontinued after the INR has been therapeutic greater than 2 on two  consecutive days.  Unfortunately the patient does not have a primary  medical doctor and [place], but plans to stay  in  [place] area.  I have called Dr. Lonia Blood, who was kind to accept  this patient and be his primary medical doctor.  Arrangements have been  made for home health RN to draw the patient's CBC and INR on April 24  and April 26 to be relayed to Dr. Mikeal Hawthorne for Coumadin dose adjustment and  timing of discontinuing his Lovenox.  I have also discussed his case in  fair detail with Dr. Mikeal Hawthorne.  The patient at this time is ambulating, had  a bowel movement, is not in any distress with stable vital signs.  His  blood pressures initially were high, for which his blood pressure  medications were adjusted with fairly good control at this time, ranging  from 120s-140s/80s.  He has been counseled regarding compliance with all  his medications and follow-up with his primary MD.  He verbalizes  understanding.   The patient was found to be smoking in the toilet yesterday.  He has  been repeatedly counseled regarding tobacco cessation.  He indicates he  is not yet willing to quit smoking.  We will not discharge him on a  nicotine patch.  I have spoken to his wife in great detail yesterday and  outlined the ongoing care.   Time spent in coordinating this discharge is 50 minutes.      Marcellus Scott, MD  Electronically Signed     AH/MEDQ  D:  12/30/2008  T:  12/30/2008  Job:  [id number]   cc:   Lonia Blood, M.D.

## 2011-01-28 ENCOUNTER — Ambulatory Visit (HOSPITAL_COMMUNITY)
Admission: RE | Admit: 2011-01-28 | Discharge: 2011-01-28 | Disposition: A | Discharge status: Home / Self Care | Payer: Medicare Other | Source: Ambulatory Visit | Attending: Urology | Admitting: Urology

## 2011-01-28 ENCOUNTER — Other Ambulatory Visit (HOSPITAL_COMMUNITY): Payer: Self-pay | Admitting: Urology

## 2011-01-28 DIAGNOSIS — N50819 Testicular pain, unspecified: Secondary | ICD-10-CM

## 2011-01-28 DIAGNOSIS — N509 Disorder of male genital organs, unspecified: Secondary | ICD-10-CM | POA: Insufficient documentation

## 2011-01-28 DIAGNOSIS — N508 Other specified disorders of male genital organs: Secondary | ICD-10-CM | POA: Insufficient documentation

## 2011-01-29 ENCOUNTER — Emergency Department (HOSPITAL_COMMUNITY)
Admission: EM | Admit: 2011-01-29 | Discharge: 2011-01-29 | Disposition: A | Discharge status: Home / Self Care | Payer: Medicare Other | Attending: Emergency Medicine | Admitting: Emergency Medicine

## 2011-01-29 ENCOUNTER — Encounter: Payer: Self-pay | Admitting: Gastroenterology

## 2011-01-29 ENCOUNTER — Ambulatory Visit (INDEPENDENT_AMBULATORY_CARE_PROVIDER_SITE_OTHER): Payer: Medicare Other | Admitting: Gastroenterology

## 2011-01-29 DIAGNOSIS — F3289 Other specified depressive episodes: Secondary | ICD-10-CM | POA: Insufficient documentation

## 2011-01-29 DIAGNOSIS — R109 Unspecified abdominal pain: Secondary | ICD-10-CM | POA: Insufficient documentation

## 2011-01-29 DIAGNOSIS — Z86711 Personal history of pulmonary embolism: Secondary | ICD-10-CM | POA: Insufficient documentation

## 2011-01-29 DIAGNOSIS — N509 Disorder of male genital organs, unspecified: Secondary | ICD-10-CM | POA: Insufficient documentation

## 2011-01-29 DIAGNOSIS — R197 Diarrhea, unspecified: Secondary | ICD-10-CM

## 2011-01-29 DIAGNOSIS — Z7901 Long term (current) use of anticoagulants: Secondary | ICD-10-CM | POA: Insufficient documentation

## 2011-01-29 DIAGNOSIS — F329 Major depressive disorder, single episode, unspecified: Secondary | ICD-10-CM | POA: Insufficient documentation

## 2011-01-29 DIAGNOSIS — I10 Essential (primary) hypertension: Secondary | ICD-10-CM | POA: Insufficient documentation

## 2011-01-29 DIAGNOSIS — R1032 Left lower quadrant pain: Secondary | ICD-10-CM | POA: Insufficient documentation

## 2011-01-29 LAB — DIFFERENTIAL
Basophils Absolute: 0 10*3/uL (ref 0.0–0.1)
Basophils Relative: 0 % (ref 0–1)
Eosinophils Absolute: 0.1 10*3/uL (ref 0.0–0.7)
Eosinophils Relative: 1 % (ref 0–5)
Lymphocytes Relative: 38 % (ref 12–46)
Lymphs Abs: 1.9 10*3/uL (ref 0.7–4.0)
Monocytes Absolute: 0.4 10*3/uL (ref 0.1–1.0)
Monocytes Relative: 9 % (ref 3–12)
Neutro Abs: 2.5 10*3/uL (ref 1.7–7.7)
Neutrophils Relative %: 52 % (ref 43–77)

## 2011-01-29 LAB — CBC
HCT: 37.9 % — ABNORMAL LOW (ref 39.0–52.0)
Hemoglobin: 12.7 g/dL — ABNORMAL LOW (ref 13.0–17.0)
MCH: 23.3 pg — ABNORMAL LOW (ref 26.0–34.0)
MCHC: 33.5 g/dL (ref 30.0–36.0)
MCV: 69.4 fL — ABNORMAL LOW (ref 78.0–100.0)
Platelets: 182 10*3/uL (ref 150–400)
RBC: 5.46 MIL/uL (ref 4.22–5.81)
RDW: 15.9 % — ABNORMAL HIGH (ref 11.5–15.5)
WBC: 4.9 10*3/uL (ref 4.0–10.5)

## 2011-01-29 LAB — URINALYSIS, ROUTINE W REFLEX MICROSCOPIC
Bilirubin Urine: NEGATIVE
Glucose, UA: NEGATIVE mg/dL
Hgb urine dipstick: NEGATIVE
Nitrite: NEGATIVE
Protein, ur: NEGATIVE mg/dL
Specific Gravity, Urine: 1.025 (ref 1.005–1.030)
Urobilinogen, UA: 0.2 mg/dL (ref 0.0–1.0)
pH: 5.5 (ref 5.0–8.0)

## 2011-01-29 MED ORDER — PEG-KCL-NACL-NASULF-NA ASC-C 100 G PO SOLR: 1.0000 | Freq: Once | ORAL | Status: AC

## 2011-01-29 NOTE — Assessment & Plan Note (Signed)
Diarrhea is an intermittent problem. With his history of dark to black mucus a lower GI bleeding source should be ruled out.  Recommendations #1 colonoscopy; this will be done while the patient is on Coumadin

## 2011-01-29 NOTE — Assessment & Plan Note (Addendum)
Pain appears to have pain related to the inguinal area rather than visceral. There is no obvious hernia.  Recommendations #1 continue urologic followup. Patient had a scrotal ultrasound today #2 if #1 is unrevealing then I would consider referral to a pain clinic

## 2011-01-29 NOTE — Progress Notes (Signed)
History of Present Illness:  [name] [name] is a pleasant 60 year old Afro-American male referred at the request of Dr. August Luz for evaluation of abdominal pain. This is been a chronic problem for several months. He underwent a left inguinal hernia repair in October, 2011. Prior and since that time he's had continuous severe pain in the left groin. It waxes and wanes but is ever present. It is unaffected by moving his bowels or urinating. He denies dysuria. He is undergoing evaluation by urology. Pain in the left groin may radiate into the left testicle.  The patient is on Coumadin for chronic DVTs. An IVC filter is in place. He has frequent loose stools and claims to have passed very dark mucus in the past.  CT scan in April, 2012 was unremarkable.    Review of Systems: Pertinent positive and negative review of systems were noted in the above HPI section. All other review of systems were otherwise negative.    Current Medications, Allergies, Past Medical History, Past Surgical History, Family History and Social History were reviewed in [employer] electronic medical record  Vital signs were reviewed in today's medical record. Physical Exam: General: Well developed , well nourished, no acute distress Head: Normocephalic and atraumatic Eyes:  sclerae anicteric, EOMI Ears: Normal auditory acuity Mouth: No deformity or lesions Lungs: Clear throughout to auscultation Heart: Regular rate and rhythm; no murmurs, rubs or bruits Abdomen: Soft, non tender and non distended. No masses, hepatosplenomegaly or hernias noted. Normal Bowel sounds Rectal:deferred Musculoskeletal: Symmetrical with no gross deformities  Pulses:  Normal pulses noted Extremities: No clubbing, cyanosis, edema or deformities noted Neurological: Alert oriented x 4, grossly nonfocal Psychological:  Alert and cooperative. Normal mood and affect

## 2011-01-29 NOTE — Patient Instructions (Signed)
Colonoscopy A colonoscopy is an exam to evaluate your entire colon. In this exam, your colon is cleansed. A long fiberoptic tube is inserted through your rectum and into your colon. The fiberoptic scope (endoscope) is a long bundle of enclosed and very flexible fibers. These fibers transmit light to the area examined and send images from that area to your caregiver. Discomfort is usually minimal. You may be given a drug to help you sleep (sedative) during or prior to the procedure. This exam helps to detect lumps (tumors), polyps, inflammation, and areas of bleeding. Your caregiver may also take a small piece of tissue (biopsy) that will be examined under a microscope. BEFORE THE PROCEDURE  A clear liquid diet may be required for 2 days before the exam.   Liquid injections (enemas) or laxatives may be required.   A large amount of electrolyte solution may be given to you to drink over a short period of time. This solution is used to clean out your colon.   You should be present 1 prior to your procedure or as directed by your caregiver.   Check in at the admissions desk to fill out necessary forms if not preregistered. There will be consent forms to sign prior to the procedure. If accompanied by friends or family, there is a waiting area for them while you are having your procedure.  LET YOUR CAREGIVER KNOW ABOUT:  Allergies to food or medicine.  Medicines taken, including vitamins, herbs, eyedrops, over-the-counter medicines, and creams.   Use of steroids (by mouth or creams).   Previous problems with anesthetics or numbing medicines.   History of bleeding problems or blood clots.  Previous surgery.   Other health problems, including diabetes and kidney problems.   Possibility of pregnancy, if this applies.   AFTER THE PROCEDURE  If you received a sedative and/or pain medicine, you will need to arrange for someone to drive you home.   Occasionally, there is a little blood passed  with the first bowel movement. DO NOT be concerned.  HOME CARE INSTRUCTIONS  It is not unusual to pass moderate amounts of gas and experience mild abdominal cramping following the procedure. This is due to air being used to inflate your colon during the exam. Walking or a warm pack on your belly (abdomen) may help.   You may resume all normal meals and activities after sedatives and medicines have worn off.   Only take over-the-counter or prescription medicines for pain, discomfort, or fever as directed by your caregiver. DO NOT use aspirin or blood thinners if a biopsy was taken. Consult your caregiver for medicine usage if biopsies were taken.  FINDING OUT THE RESULTS OF YOUR TEST Not all test results are available during your visit. If your test results are not back during the visit, make an appointment with your caregiver to find out the results. Do not assume everything is normal if you have not heard from your caregiver or the medical facility. It is important for you to follow up on all of your test results. SEEK IMMEDIATE MEDICAL CARE IF:  You pass large blood clots or fill a toilet with blood following the procedure. This may also occur 10 to 14 days following the procedure. This is more likely if a biopsy was taken.   You develop abdominal pain that keeps getting worse and cannot be relieved with medicine.  Document Released: 08/23/2000 Document Re-Released: 11/20/2009 [facility] Patient Information 2011 [place], [place]. Your colonoscopy is scheduled on 03/05/2011 at  8:30am You can pick up your MoviPrep from your pharmacy today

## 2011-01-30 ENCOUNTER — Encounter: Payer: Self-pay | Admitting: Gastroenterology

## 2011-02-08 ENCOUNTER — Encounter (HOSPITAL_BASED_OUTPATIENT_CLINIC_OR_DEPARTMENT_OTHER): Payer: Medicare Other | Admitting: Oncology

## 2011-02-08 ENCOUNTER — Other Ambulatory Visit: Payer: Self-pay | Admitting: Oncology

## 2011-02-08 DIAGNOSIS — Z86718 Personal history of other venous thrombosis and embolism: Secondary | ICD-10-CM

## 2011-02-08 DIAGNOSIS — Z7901 Long term (current) use of anticoagulants: Secondary | ICD-10-CM

## 2011-02-08 DIAGNOSIS — D689 Coagulation defect, unspecified: Secondary | ICD-10-CM

## 2011-02-08 LAB — PROTIME-INR
INR: 1.4 — ABNORMAL LOW (ref 2.00–3.50)
Protime: 16.8 Seconds — ABNORMAL HIGH (ref 10.6–13.4)

## 2011-02-13 ENCOUNTER — Telehealth: Payer: Self-pay | Admitting: Gastroenterology

## 2011-02-13 NOTE — Telephone Encounter (Signed)
Pt states that his diverticulitis is bothering him. Pt requests something for pain for the diverticulitis. Dr. Arlyce Dice please advise.

## 2011-02-14 ENCOUNTER — Other Ambulatory Visit: Payer: Self-pay | Admitting: Gastroenterology

## 2011-02-14 NOTE — Telephone Encounter (Signed)
He should speak with his PCP about this.  Any medicine may affect his coumadin and INR

## 2011-02-14 NOTE — Telephone Encounter (Signed)
Patient given Dr. Kaplan's recommendation. 

## 2011-02-15 ENCOUNTER — Other Ambulatory Visit: Payer: Self-pay | Admitting: Oncology

## 2011-02-15 ENCOUNTER — Encounter (HOSPITAL_BASED_OUTPATIENT_CLINIC_OR_DEPARTMENT_OTHER): Payer: Medicare Other | Admitting: Oncology

## 2011-02-15 DIAGNOSIS — Z86718 Personal history of other venous thrombosis and embolism: Secondary | ICD-10-CM

## 2011-02-15 DIAGNOSIS — D689 Coagulation defect, unspecified: Secondary | ICD-10-CM

## 2011-02-15 DIAGNOSIS — Z7901 Long term (current) use of anticoagulants: Secondary | ICD-10-CM

## 2011-02-15 LAB — PROTIME-INR
INR: 3.4 (ref 2.00–3.50)
Protime: 40.8 Seconds — ABNORMAL HIGH (ref 10.6–13.4)

## 2011-02-20 ENCOUNTER — Emergency Department (HOSPITAL_COMMUNITY): Payer: Medicare Other

## 2011-02-20 ENCOUNTER — Emergency Department (HOSPITAL_COMMUNITY)
Admission: EM | Admit: 2011-02-20 | Discharge: 2011-02-20 | Disposition: A | Discharge status: Home / Self Care | Payer: Medicare Other | Attending: Emergency Medicine | Admitting: Emergency Medicine

## 2011-02-20 DIAGNOSIS — F329 Major depressive disorder, single episode, unspecified: Secondary | ICD-10-CM | POA: Insufficient documentation

## 2011-02-20 DIAGNOSIS — F3289 Other specified depressive episodes: Secondary | ICD-10-CM | POA: Insufficient documentation

## 2011-02-20 DIAGNOSIS — I10 Essential (primary) hypertension: Secondary | ICD-10-CM | POA: Insufficient documentation

## 2011-02-20 DIAGNOSIS — R1032 Left lower quadrant pain: Secondary | ICD-10-CM | POA: Insufficient documentation

## 2011-02-20 DIAGNOSIS — R197 Diarrhea, unspecified: Secondary | ICD-10-CM | POA: Insufficient documentation

## 2011-02-20 DIAGNOSIS — Z86718 Personal history of other venous thrombosis and embolism: Secondary | ICD-10-CM | POA: Insufficient documentation

## 2011-02-20 DIAGNOSIS — N509 Disorder of male genital organs, unspecified: Secondary | ICD-10-CM | POA: Insufficient documentation

## 2011-02-20 LAB — POCT I-STAT, CHEM 8
BUN: 8 mg/dL (ref 6–23)
Calcium, Ion: 1.16 mmol/L (ref 1.12–1.32)
Chloride: 100 mEq/L (ref 96–112)
Creatinine, Ser: 1 mg/dL (ref 0.4–1.5)
Glucose, Bld: 81 mg/dL (ref 70–99)
HCT: 54 % — ABNORMAL HIGH (ref 39.0–52.0)
Hemoglobin: 18.4 g/dL — ABNORMAL HIGH (ref 13.0–17.0)
Potassium: 3.4 mEq/L — ABNORMAL LOW (ref 3.5–5.1)
Sodium: 139 mEq/L (ref 135–145)
TCO2: 28 mmol/L (ref 0–100)

## 2011-02-20 LAB — URINALYSIS, ROUTINE W REFLEX MICROSCOPIC
Bilirubin Urine: NEGATIVE
Glucose, UA: NEGATIVE mg/dL
Hgb urine dipstick: NEGATIVE
Ketones, ur: NEGATIVE mg/dL
Leukocytes, UA: NEGATIVE
Nitrite: NEGATIVE
Protein, ur: NEGATIVE mg/dL
Specific Gravity, Urine: 1.013 (ref 1.005–1.030)
Urobilinogen, UA: 0.2 mg/dL (ref 0.0–1.0)
pH: 7 (ref 5.0–8.0)

## 2011-02-20 LAB — CBC
HCT: 45.6 % (ref 39.0–52.0)
Hemoglobin: 15.4 g/dL (ref 13.0–17.0)
MCH: 23.2 pg — ABNORMAL LOW (ref 26.0–34.0)
MCHC: 33.8 g/dL (ref 30.0–36.0)
MCV: 68.7 fL — ABNORMAL LOW (ref 78.0–100.0)
Platelets: 236 10*3/uL (ref 150–400)
RBC: 6.64 MIL/uL — ABNORMAL HIGH (ref 4.22–5.81)
RDW: 16.1 % — ABNORMAL HIGH (ref 11.5–15.5)
WBC: 6.8 10*3/uL (ref 4.0–10.5)

## 2011-02-20 LAB — HEPATIC FUNCTION PANEL
ALT: 15 U/L (ref 0–53)
AST: 15 U/L (ref 0–37)
Albumin: 4.5 g/dL (ref 3.5–5.2)
Alkaline Phosphatase: 63 U/L (ref 39–117)
Bilirubin, Direct: 0.1 mg/dL (ref 0.0–0.3)
Indirect Bilirubin: 0.2 mg/dL — ABNORMAL LOW (ref 0.3–0.9)
Total Bilirubin: 0.3 mg/dL (ref 0.3–1.2)
Total Protein: 7.2 g/dL (ref 6.0–8.3)

## 2011-02-20 MED ORDER — IOHEXOL 300 MG/ML  SOLN
100.0000 mL | Freq: Once | INTRAMUSCULAR | Status: AC | PRN
  Administered 2011-02-20: 100 mL via INTRAVENOUS

## 2011-02-22 ENCOUNTER — Encounter (HOSPITAL_BASED_OUTPATIENT_CLINIC_OR_DEPARTMENT_OTHER): Payer: Medicare Other | Admitting: Oncology

## 2011-02-22 ENCOUNTER — Other Ambulatory Visit: Payer: Self-pay | Admitting: Oncology

## 2011-02-22 DIAGNOSIS — Z86718 Personal history of other venous thrombosis and embolism: Secondary | ICD-10-CM

## 2011-02-22 DIAGNOSIS — D689 Coagulation defect, unspecified: Secondary | ICD-10-CM

## 2011-02-22 DIAGNOSIS — Z7901 Long term (current) use of anticoagulants: Secondary | ICD-10-CM

## 2011-02-22 LAB — PROTIME-INR
INR: 1.9 — ABNORMAL LOW (ref 2.00–3.50)
Protime: 22.8 Seconds — ABNORMAL HIGH (ref 10.6–13.4)

## 2011-03-05 ENCOUNTER — Ambulatory Visit (AMBULATORY_SURGERY_CENTER): Payer: Medicare Other | Admitting: Gastroenterology

## 2011-03-05 ENCOUNTER — Encounter: Payer: Self-pay | Admitting: Gastroenterology

## 2011-03-05 DIAGNOSIS — R109 Unspecified abdominal pain: Secondary | ICD-10-CM

## 2011-03-05 DIAGNOSIS — R197 Diarrhea, unspecified: Secondary | ICD-10-CM

## 2011-03-05 DIAGNOSIS — Z139 Encounter for screening, unspecified: Secondary | ICD-10-CM

## 2011-03-05 DIAGNOSIS — D126 Benign neoplasm of colon, unspecified: Secondary | ICD-10-CM

## 2011-03-05 DIAGNOSIS — Z86010 Personal history of colonic polyps: Secondary | ICD-10-CM

## 2011-03-05 MED ORDER — SODIUM CHLORIDE 0.9 % IV SOLN: 500.0000 mL | INTRAVENOUS | Status: DC

## 2011-03-05 NOTE — Patient Instructions (Signed)
Please review discharge instructions  Resume Coumadin the a.m.

## 2011-03-06 ENCOUNTER — Other Ambulatory Visit: Payer: Self-pay | Admitting: Oncology

## 2011-03-06 ENCOUNTER — Encounter (HOSPITAL_BASED_OUTPATIENT_CLINIC_OR_DEPARTMENT_OTHER): Payer: Medicare Other | Admitting: Oncology

## 2011-03-06 ENCOUNTER — Telehealth: Payer: Self-pay | Admitting: *Deleted

## 2011-03-06 DIAGNOSIS — Z86718 Personal history of other venous thrombosis and embolism: Secondary | ICD-10-CM

## 2011-03-06 DIAGNOSIS — Z7901 Long term (current) use of anticoagulants: Secondary | ICD-10-CM

## 2011-03-06 DIAGNOSIS — R209 Unspecified disturbances of skin sensation: Secondary | ICD-10-CM

## 2011-03-06 DIAGNOSIS — D689 Coagulation defect, unspecified: Secondary | ICD-10-CM

## 2011-03-06 LAB — PROTIME-INR
INR: 3 (ref 2.00–3.50)
Protime: 36 Seconds — ABNORMAL HIGH (ref 10.6–13.4)

## 2011-03-06 NOTE — Telephone Encounter (Signed)
Pt still asleep.  Spoke with wife.  She states that he is "doing fine". Instructed them to call back if he has questions or concerns.

## 2011-03-09 IMAGING — CT CT ANGIO CHEST
2 of 6 series · 19 of 46 positions shown · IV contrast (APPLIED)
Comparison: 12/27/2008

CLINICAL DATA: Chest pain.

CT ANGIOGRAPHY CHEST WITH CONTRAST
TECHNIQUE: Multidetector CT imaging of the chest was performed
using the standard protocol during bolus administration of
intravenous contrast. Multiplanar CT image reconstructions
including MIPs were obtained to evaluate the vascular anatomy.
Contrast: 80 ml Wmnipaque-GEE

[Series 5: pe thins @ 1mm · axial · 0.84mm/px · z∈[-330,-61]mm · 16 of 295 slices shown]
[im 13/295  lung]
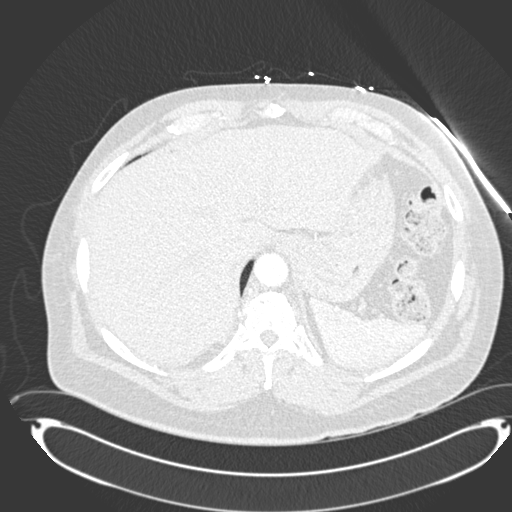
[im 39/295  soft-tissue]
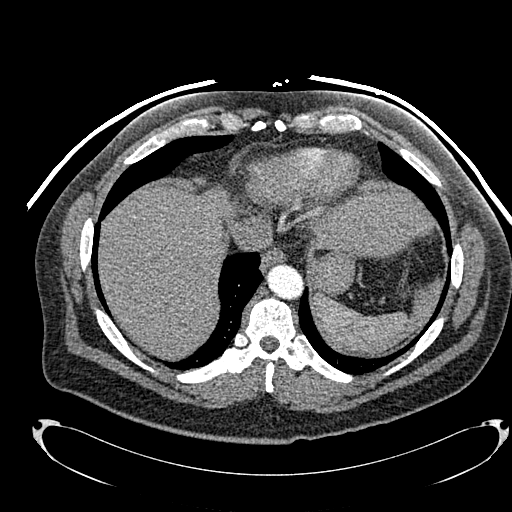
[im 52/295  lung]
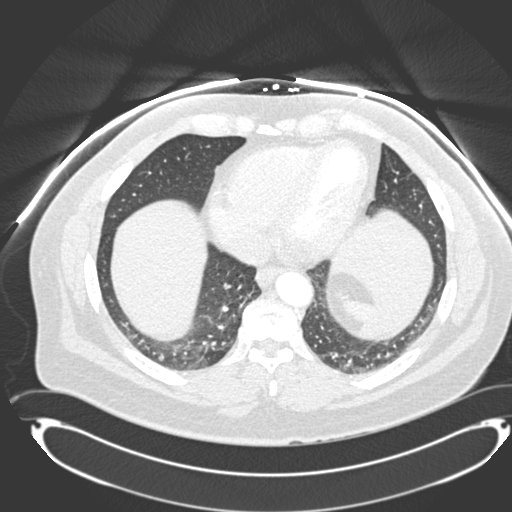
[im 64/295  soft-tissue]
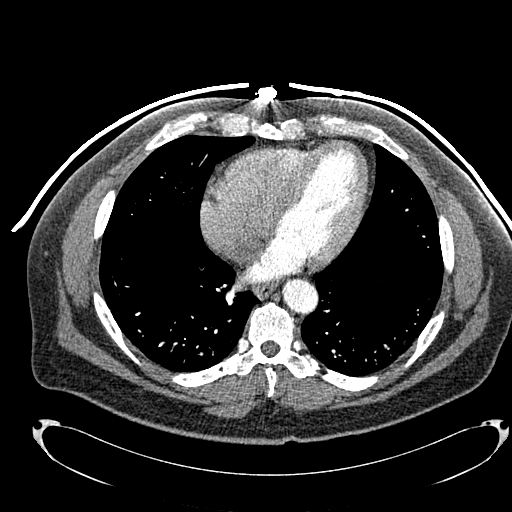
[im 90/295  lung]
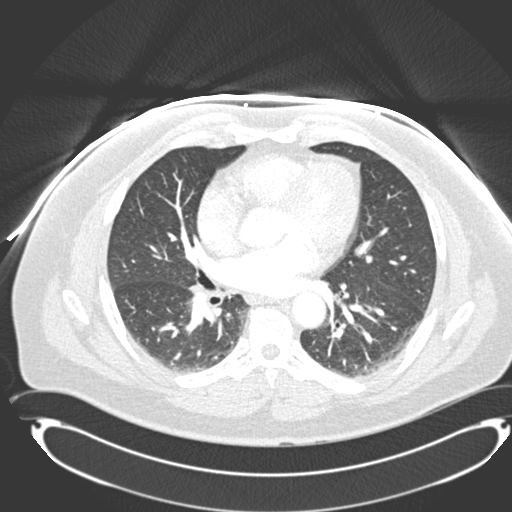
[im 103/295  soft-tissue]
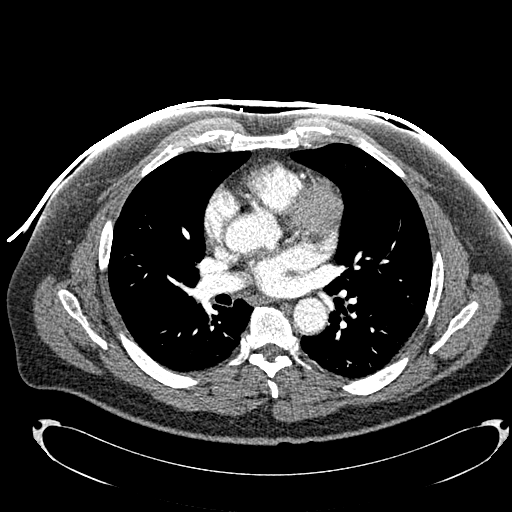
[im 116/295  lung]
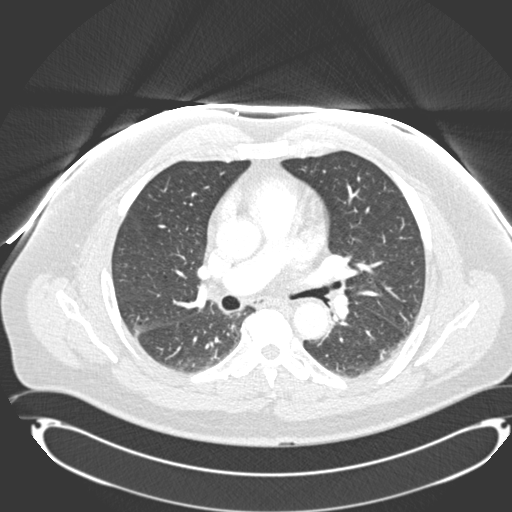
[im 141/295  soft-tissue]
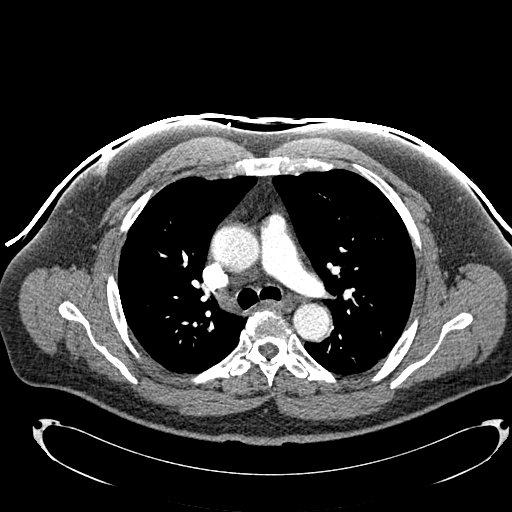
[im 154/295  lung]
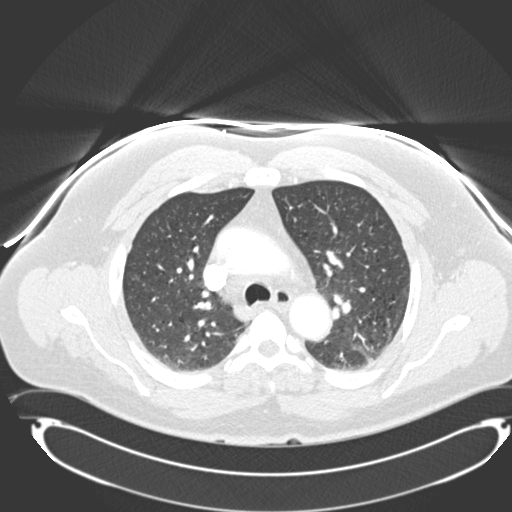
[im 179/295  soft-tissue]
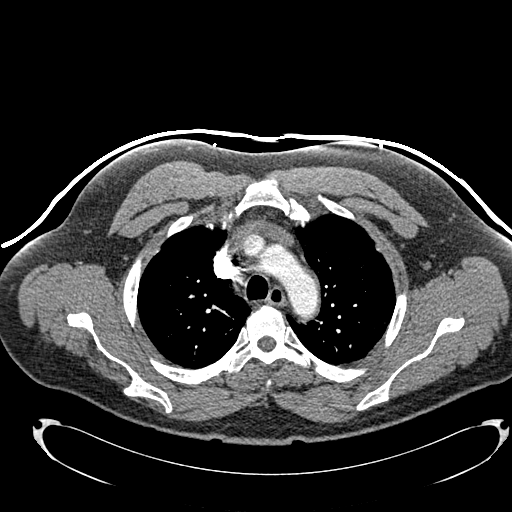
[im 192/295  lung]
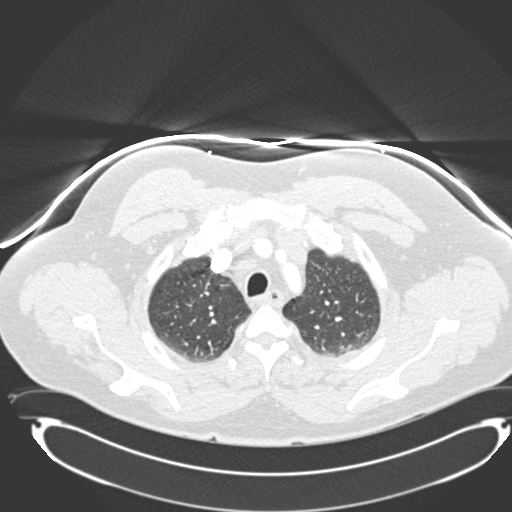
[im 205/295  soft-tissue]
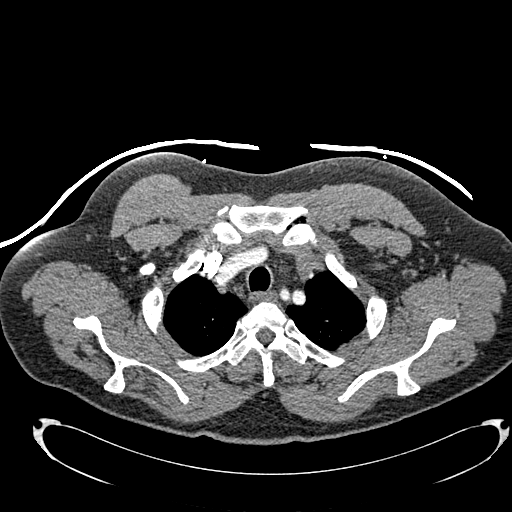
[im 231/295  lung]
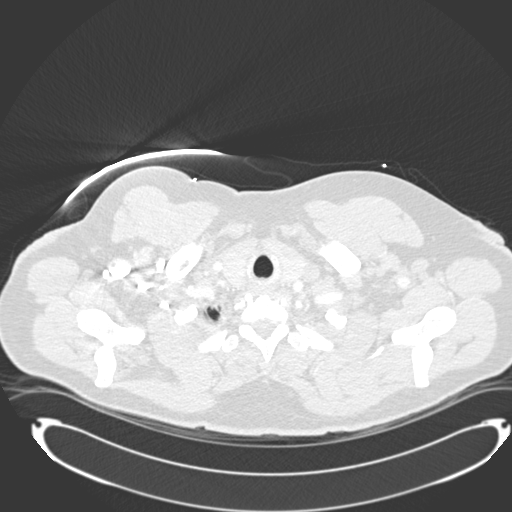
[im 243/295  soft-tissue]
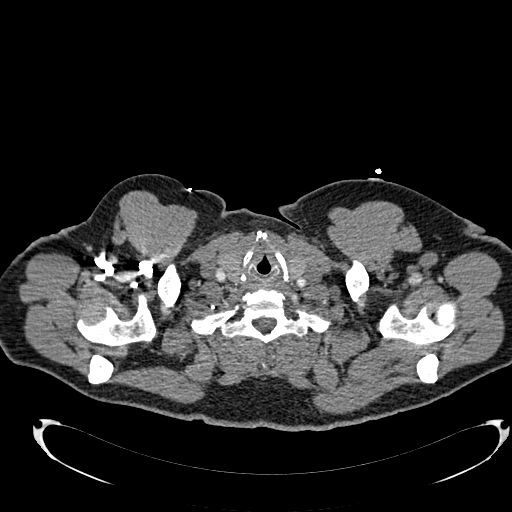
[im 256/295  lung]
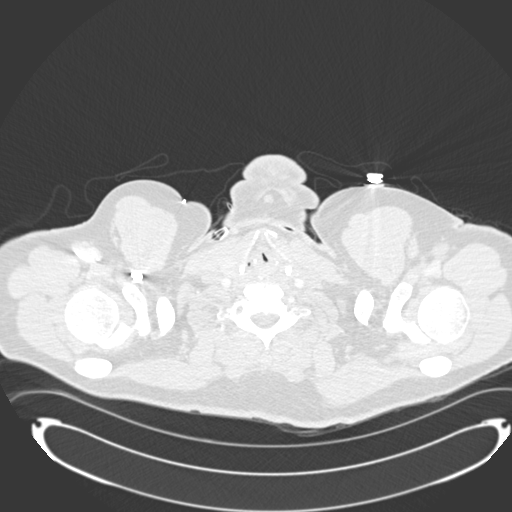
[im 282/295  soft-tissue]
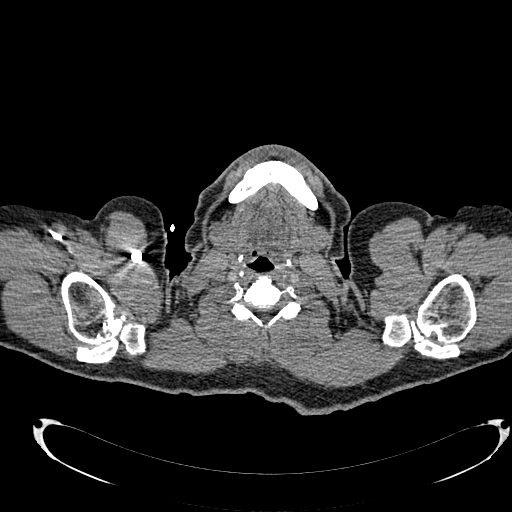

[Series 602: coronal mpr · coronal · 0.84mm/px · 3 of 126 slices shown]
[im 32/126  soft-tissue]
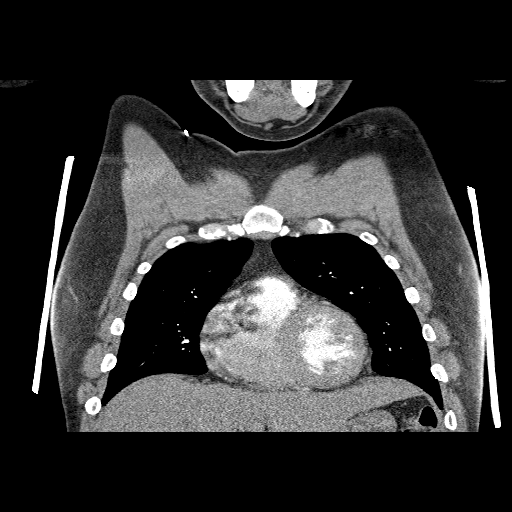
[im 63/126  soft-tissue]
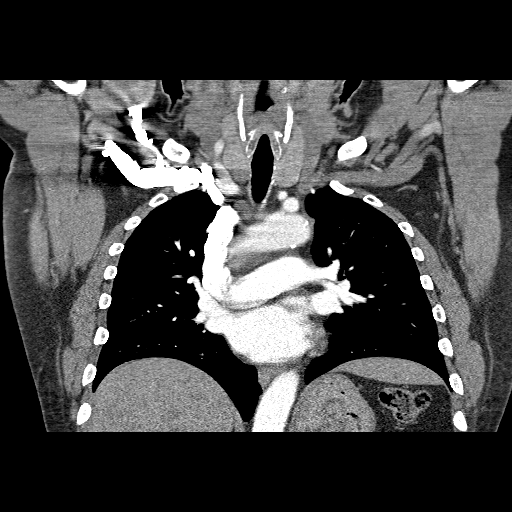
[im 94/126  soft-tissue]
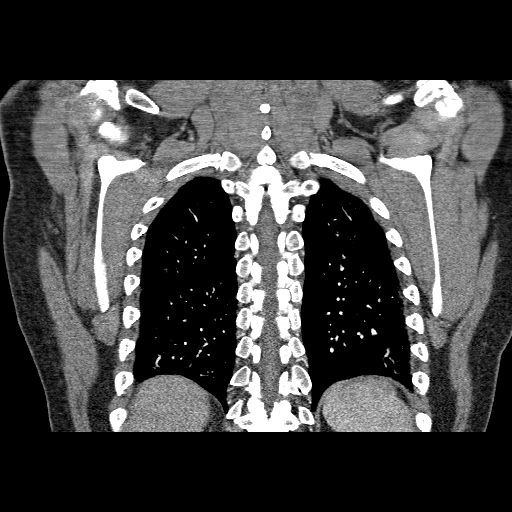

[19 of 46 positions shown; findings below may reference images not displayed]

FINDINGS: The chest wall is unremarkable.  No supraclavicular or
axillary adenopathy.  The heart is normal in size and stable.  No
pericardial effusion.  No mediastinal or hilar adenopathy.  The
aorta is normal in caliber.  No dissection.  The esophagus is
grossly normal.

The pulmonary arterial tree is well opacified.  No filling defects
are seen to suggest pulmonary emboli.

Examination of the lung parenchyma demonstrates dependent
atelectasis but no infiltrates, edema or effusions.  No pulmonary
nodules or masses.  No change since prior study.

The visualized upper abdomen is grossly normal.

Review of the MIP images confirms the above findings.
IMPRESSION: 1.  No CT findings for pulmonary embolism.
2.  Normal thoracic aorta.
3.  No acute pulmonary findings.

## 2011-03-14 ENCOUNTER — Encounter (HOSPITAL_BASED_OUTPATIENT_CLINIC_OR_DEPARTMENT_OTHER): Payer: Medicare Other | Admitting: Oncology

## 2011-03-14 ENCOUNTER — Other Ambulatory Visit: Payer: Self-pay | Admitting: Oncology

## 2011-03-14 DIAGNOSIS — D689 Coagulation defect, unspecified: Secondary | ICD-10-CM

## 2011-03-14 DIAGNOSIS — Z86718 Personal history of other venous thrombosis and embolism: Secondary | ICD-10-CM

## 2011-03-14 DIAGNOSIS — Z7901 Long term (current) use of anticoagulants: Secondary | ICD-10-CM

## 2011-03-14 LAB — PROTIME-INR
INR: 2.6 (ref 2.00–3.50)
Protime: 31.2 Seconds — ABNORMAL HIGH (ref 10.6–13.4)

## 2011-04-03 ENCOUNTER — Ambulatory Visit: Payer: Medicare Other | Admitting: Gastroenterology

## 2011-04-03 ENCOUNTER — Telehealth: Payer: Self-pay | Admitting: *Deleted

## 2011-04-03 NOTE — Telephone Encounter (Signed)
Pt had to cancel due to funeral, No charge

## 2011-04-05 ENCOUNTER — Encounter (HOSPITAL_BASED_OUTPATIENT_CLINIC_OR_DEPARTMENT_OTHER): Payer: Medicare Other | Admitting: Oncology

## 2011-04-05 ENCOUNTER — Other Ambulatory Visit: Payer: Self-pay | Admitting: Oncology

## 2011-04-05 DIAGNOSIS — Z86718 Personal history of other venous thrombosis and embolism: Secondary | ICD-10-CM

## 2011-04-05 DIAGNOSIS — Z7901 Long term (current) use of anticoagulants: Secondary | ICD-10-CM

## 2011-04-05 DIAGNOSIS — D689 Coagulation defect, unspecified: Secondary | ICD-10-CM

## 2011-04-05 LAB — CBC WITH DIFFERENTIAL/PLATELET
BASO%: 0.5 % (ref 0.0–2.0)
Basophils Absolute: 0 10*3/uL (ref 0.0–0.1)
EOS%: 2.1 % (ref 0.0–7.0)
Eosinophils Absolute: 0.1 10*3/uL (ref 0.0–0.5)
HCT: 36.6 % — ABNORMAL LOW (ref 38.4–49.9)
HGB: 12.4 g/dL — ABNORMAL LOW (ref 13.0–17.1)
LYMPH%: 40 % (ref 14.0–49.0)
MCH: 23.4 pg — ABNORMAL LOW (ref 27.2–33.4)
MCHC: 33.9 g/dL (ref 32.0–36.0)
MCV: 69.2 fL — ABNORMAL LOW (ref 79.3–98.0)
MONO#: 0.4 10*3/uL (ref 0.1–0.9)
MONO%: 10.4 % (ref 0.0–14.0)
NEUT#: 2 10*3/uL (ref 1.5–6.5)
NEUT%: 47 % (ref 39.0–75.0)
Platelets: 186 10*3/uL (ref 140–400)
RBC: 5.29 10*6/uL (ref 4.20–5.82)
RDW: 16.2 % — ABNORMAL HIGH (ref 11.0–14.6)
WBC: 4.2 10*3/uL (ref 4.0–10.3)
lymph#: 1.7 10*3/uL (ref 0.9–3.3)

## 2011-04-05 LAB — PROTIME-INR
INR: 4.6 — ABNORMAL HIGH (ref 2.00–3.50)
Protime: 55.2 Seconds — ABNORMAL HIGH (ref 10.6–13.4)

## 2011-04-16 ENCOUNTER — Encounter (HOSPITAL_BASED_OUTPATIENT_CLINIC_OR_DEPARTMENT_OTHER): Payer: Medicare Other | Admitting: Oncology

## 2011-04-16 ENCOUNTER — Other Ambulatory Visit: Payer: Self-pay | Admitting: Oncology

## 2011-04-16 DIAGNOSIS — D689 Coagulation defect, unspecified: Secondary | ICD-10-CM

## 2011-04-16 DIAGNOSIS — Z7901 Long term (current) use of anticoagulants: Secondary | ICD-10-CM

## 2011-04-16 DIAGNOSIS — Z86718 Personal history of other venous thrombosis and embolism: Secondary | ICD-10-CM

## 2011-04-16 LAB — CBC WITH DIFFERENTIAL/PLATELET
BASO%: 0.5 % (ref 0.0–2.0)
Basophils Absolute: 0 10*3/uL (ref 0.0–0.1)
EOS%: 2.7 % (ref 0.0–7.0)
Eosinophils Absolute: 0.1 10*3/uL (ref 0.0–0.5)
HCT: 40.3 % (ref 38.4–49.9)
HGB: 13.4 g/dL (ref 13.0–17.1)
LYMPH%: 26.2 % (ref 14.0–49.0)
MCH: 23.5 pg — ABNORMAL LOW (ref 27.2–33.4)
MCHC: 33.3 g/dL (ref 32.0–36.0)
MCV: 70.7 fL — ABNORMAL LOW (ref 79.3–98.0)
MONO#: 0.4 10*3/uL (ref 0.1–0.9)
MONO%: 9.9 % (ref 0.0–14.0)
NEUT#: 2.5 10*3/uL (ref 1.5–6.5)
NEUT%: 60.7 % (ref 39.0–75.0)
Platelets: 263 10*3/uL (ref 140–400)
RBC: 5.7 10*6/uL (ref 4.20–5.82)
RDW: 15.4 % — ABNORMAL HIGH (ref 11.0–14.6)
WBC: 4 10*3/uL (ref 4.0–10.3)
lymph#: 1.1 10*3/uL (ref 0.9–3.3)
nRBC: 0 % (ref 0–0)

## 2011-04-16 LAB — PROTIME-INR
INR: 1.8 — ABNORMAL LOW (ref 2.00–3.50)
Protime: 21.6 Seconds — ABNORMAL HIGH (ref 10.6–13.4)

## 2011-04-19 ENCOUNTER — Other Ambulatory Visit: Payer: Self-pay | Admitting: Oncology

## 2011-04-19 ENCOUNTER — Encounter (HOSPITAL_BASED_OUTPATIENT_CLINIC_OR_DEPARTMENT_OTHER): Payer: Medicare Other | Admitting: Oncology

## 2011-04-19 DIAGNOSIS — Z86718 Personal history of other venous thrombosis and embolism: Secondary | ICD-10-CM

## 2011-04-19 DIAGNOSIS — Z7901 Long term (current) use of anticoagulants: Secondary | ICD-10-CM

## 2011-04-19 DIAGNOSIS — D689 Coagulation defect, unspecified: Secondary | ICD-10-CM

## 2011-04-19 LAB — PROTIME-INR
INR: 3 (ref 2.00–3.50)
Protime: 36 Seconds — ABNORMAL HIGH (ref 10.6–13.4)

## 2011-05-07 ENCOUNTER — Encounter (HOSPITAL_BASED_OUTPATIENT_CLINIC_OR_DEPARTMENT_OTHER): Payer: Medicare Other | Admitting: Oncology

## 2011-05-07 ENCOUNTER — Other Ambulatory Visit: Payer: Self-pay | Admitting: Oncology

## 2011-05-07 DIAGNOSIS — Z86718 Personal history of other venous thrombosis and embolism: Secondary | ICD-10-CM

## 2011-05-07 DIAGNOSIS — Z7901 Long term (current) use of anticoagulants: Secondary | ICD-10-CM

## 2011-05-07 DIAGNOSIS — D689 Coagulation defect, unspecified: Secondary | ICD-10-CM

## 2011-05-07 LAB — PROTIME-INR
INR: 1.7 — ABNORMAL LOW (ref 2.00–3.50)
Protime: 20.4 Seconds — ABNORMAL HIGH (ref 10.6–13.4)

## 2011-05-22 ENCOUNTER — Other Ambulatory Visit: Payer: Self-pay | Admitting: Oncology

## 2011-05-22 ENCOUNTER — Encounter (HOSPITAL_BASED_OUTPATIENT_CLINIC_OR_DEPARTMENT_OTHER): Payer: Medicare Other | Admitting: Oncology

## 2011-05-22 DIAGNOSIS — I2699 Other pulmonary embolism without acute cor pulmonale: Secondary | ICD-10-CM

## 2011-05-22 DIAGNOSIS — Z86718 Personal history of other venous thrombosis and embolism: Secondary | ICD-10-CM

## 2011-05-22 DIAGNOSIS — D689 Coagulation defect, unspecified: Secondary | ICD-10-CM

## 2011-05-22 DIAGNOSIS — Z7901 Long term (current) use of anticoagulants: Secondary | ICD-10-CM

## 2011-05-22 DIAGNOSIS — Z5181 Encounter for therapeutic drug level monitoring: Secondary | ICD-10-CM

## 2011-05-22 LAB — PROTIME-INR
INR: 3.1 (ref 2.00–3.50)
Protime: 37.2 Seconds — ABNORMAL HIGH (ref 10.6–13.4)

## 2011-05-29 ENCOUNTER — Ambulatory Visit (INDEPENDENT_AMBULATORY_CARE_PROVIDER_SITE_OTHER): Payer: No Typology Code available for payment source | Admitting: Psychiatry

## 2011-05-29 DIAGNOSIS — F1210 Cannabis abuse, uncomplicated: Secondary | ICD-10-CM

## 2011-05-29 DIAGNOSIS — F17200 Nicotine dependence, unspecified, uncomplicated: Secondary | ICD-10-CM

## 2011-05-29 DIAGNOSIS — F0630 Mood disorder due to known physiological condition, unspecified: Secondary | ICD-10-CM

## 2011-06-05 ENCOUNTER — Encounter (HOSPITAL_BASED_OUTPATIENT_CLINIC_OR_DEPARTMENT_OTHER): Payer: No Typology Code available for payment source | Admitting: Oncology

## 2011-06-05 ENCOUNTER — Other Ambulatory Visit: Payer: Self-pay | Admitting: Oncology

## 2011-06-05 DIAGNOSIS — Z86718 Personal history of other venous thrombosis and embolism: Secondary | ICD-10-CM

## 2011-06-05 DIAGNOSIS — D689 Coagulation defect, unspecified: Secondary | ICD-10-CM

## 2011-06-05 DIAGNOSIS — Z7901 Long term (current) use of anticoagulants: Secondary | ICD-10-CM

## 2011-06-05 LAB — PROTIME-INR
INR: 4.2 — ABNORMAL HIGH (ref 2.00–3.50)
Protime: 50.4 Seconds — ABNORMAL HIGH (ref 10.6–13.4)

## 2011-06-06 ENCOUNTER — Ambulatory Visit: Payer: Medicare Other | Admitting: Psychiatry

## 2011-06-07 LAB — PROTIME-INR
INR: 1
Prothrombin Time: 13.5

## 2011-06-14 ENCOUNTER — Other Ambulatory Visit: Payer: Self-pay | Admitting: Oncology

## 2011-06-14 ENCOUNTER — Encounter (HOSPITAL_BASED_OUTPATIENT_CLINIC_OR_DEPARTMENT_OTHER): Payer: No Typology Code available for payment source | Admitting: Oncology

## 2011-06-14 DIAGNOSIS — Z7901 Long term (current) use of anticoagulants: Secondary | ICD-10-CM

## 2011-06-14 DIAGNOSIS — D689 Coagulation defect, unspecified: Secondary | ICD-10-CM

## 2011-06-14 DIAGNOSIS — Z86718 Personal history of other venous thrombosis and embolism: Secondary | ICD-10-CM

## 2011-06-14 LAB — PROTIME-INR
INR: 2.7 (ref 2.00–3.50)
Protime: 32.4 Seconds — ABNORMAL HIGH (ref 10.6–13.4)

## 2011-07-02 ENCOUNTER — Other Ambulatory Visit: Payer: Self-pay | Admitting: Oncology

## 2011-07-02 ENCOUNTER — Encounter (HOSPITAL_BASED_OUTPATIENT_CLINIC_OR_DEPARTMENT_OTHER): Payer: Medicare Other | Admitting: Oncology

## 2011-07-02 DIAGNOSIS — Z7901 Long term (current) use of anticoagulants: Secondary | ICD-10-CM

## 2011-07-02 DIAGNOSIS — D689 Coagulation defect, unspecified: Secondary | ICD-10-CM

## 2011-07-02 DIAGNOSIS — Z86718 Personal history of other venous thrombosis and embolism: Secondary | ICD-10-CM

## 2011-07-02 LAB — PROTIME-INR
INR: 3.5 (ref 2.00–3.50)
Protime: 42 Seconds — ABNORMAL HIGH (ref 10.6–13.4)

## 2011-07-10 ENCOUNTER — Other Ambulatory Visit: Payer: Self-pay | Admitting: Internal Medicine

## 2011-07-10 ENCOUNTER — Other Ambulatory Visit: Payer: Self-pay | Admitting: Orthopedic Surgery

## 2011-07-10 DIAGNOSIS — M79603 Pain in arm, unspecified: Secondary | ICD-10-CM

## 2011-07-10 DIAGNOSIS — M542 Cervicalgia: Secondary | ICD-10-CM

## 2011-07-10 DIAGNOSIS — M5030 Other cervical disc degeneration, unspecified cervical region: Secondary | ICD-10-CM

## 2011-07-10 DIAGNOSIS — R519 Headache, unspecified: Secondary | ICD-10-CM

## 2011-07-11 DIAGNOSIS — I2699 Other pulmonary embolism without acute cor pulmonale: Secondary | ICD-10-CM | POA: Insufficient documentation

## 2011-07-11 DIAGNOSIS — I82409 Acute embolism and thrombosis of unspecified deep veins of unspecified lower extremity: Secondary | ICD-10-CM | POA: Insufficient documentation

## 2011-07-12 ENCOUNTER — Ambulatory Visit
Admission: RE | Admit: 2011-07-12 | Discharge: 2011-07-12 | Disposition: A | Discharge status: Home / Self Care | Payer: No Typology Code available for payment source | Source: Ambulatory Visit | Attending: Internal Medicine | Admitting: Internal Medicine

## 2011-07-12 DIAGNOSIS — M542 Cervicalgia: Secondary | ICD-10-CM

## 2011-07-12 DIAGNOSIS — R519 Headache, unspecified: Secondary | ICD-10-CM

## 2011-07-16 NOTE — Progress Notes (Signed)
07/15/11, Received a call from [facility] with [facility]. Dr. Shon Baton has ordered a cervical myelogram. The office faxed paperwork for clearance to be for patient to be off Coumadin for 4 days. INR will be checked the day of the procedure and INR needs to be< 1.5 This paper work was faxed on 10/31 to Dr. Cyndie Chime. Asked Reuel Boom to refax it to [phone]. Will follow up with Dr. Cyndie Chime regarding this information and anticoagulation plan. Myelogram will be scheduled once clearance received from Dr. Cyndie Chime. 07/16/11, BLP

## 2011-07-17 ENCOUNTER — Other Ambulatory Visit: Payer: No Typology Code available for payment source | Admitting: Lab

## 2011-07-17 ENCOUNTER — Ambulatory Visit: Payer: No Typology Code available for payment source

## 2011-07-17 NOTE — Progress Notes (Signed)
No Show for coumadin clinic on 07/17/11 G. Doran Clay.D.

## 2011-07-19 ENCOUNTER — Ambulatory Visit: Payer: Medicare Other

## 2011-07-19 ENCOUNTER — Ambulatory Visit: Payer: Self-pay | Admitting: Oncology

## 2011-07-19 ENCOUNTER — Other Ambulatory Visit: Payer: Self-pay | Admitting: Oncology

## 2011-07-19 ENCOUNTER — Ambulatory Visit (HOSPITAL_BASED_OUTPATIENT_CLINIC_OR_DEPARTMENT_OTHER): Payer: Medicare Other

## 2011-07-19 DIAGNOSIS — Z86718 Personal history of other venous thrombosis and embolism: Secondary | ICD-10-CM

## 2011-07-19 DIAGNOSIS — I2699 Other pulmonary embolism without acute cor pulmonale: Secondary | ICD-10-CM

## 2011-07-19 DIAGNOSIS — Z7901 Long term (current) use of anticoagulants: Secondary | ICD-10-CM

## 2011-07-19 DIAGNOSIS — I82409 Acute embolism and thrombosis of unspecified deep veins of unspecified lower extremity: Secondary | ICD-10-CM

## 2011-07-19 DIAGNOSIS — D689 Coagulation defect, unspecified: Secondary | ICD-10-CM

## 2011-07-19 LAB — POCT INR
INR: 2.2
INR: 2.2

## 2011-07-19 LAB — PROTIME-INR
INR: 2.2 (ref 2.00–3.50)
Protime: 26.4 Seconds — ABNORMAL HIGH (ref 10.6–13.4)

## 2011-07-30 ENCOUNTER — Other Ambulatory Visit: Payer: Self-pay | Admitting: Pharmacist

## 2011-07-30 ENCOUNTER — Ambulatory Visit (HOSPITAL_BASED_OUTPATIENT_CLINIC_OR_DEPARTMENT_OTHER): Payer: Self-pay | Admitting: Pharmacist

## 2011-07-30 ENCOUNTER — Other Ambulatory Visit: Payer: Self-pay | Admitting: Oncology

## 2011-07-30 DIAGNOSIS — I2699 Other pulmonary embolism without acute cor pulmonale: Secondary | ICD-10-CM

## 2011-07-30 DIAGNOSIS — I82409 Acute embolism and thrombosis of unspecified deep veins of unspecified lower extremity: Secondary | ICD-10-CM

## 2011-07-30 MED ORDER — ENOXAPARIN SODIUM 40 MG/0.4ML ~~LOC~~ SOLN: 1.5000 mg/kg | SUBCUTANEOUS | Status: DC

## 2011-07-30 MED ORDER — ENOXAPARIN SODIUM 150 MG/ML ~~LOC~~ SOLN
1.5000 mg/kg | Freq: Once | SUBCUTANEOUS | Status: DC
  Filled 2011-07-30: qty 0.2

## 2011-07-30 NOTE — Progress Notes (Signed)
See pt instructions for details on bridging. Please give pt Lovenox 100 mg & 80 mg syringes as samples when pt is here on 08/05/11.  Pt has self-injected 170 mg dose before.  May need to remind him of how to discard a portion of the dose for the 170 mg total dose. Also will need to schedule his next INR post-op. Pt understands plan & scheduling will contact pt w/ time of his appt on 08/05/11.

## 2011-07-30 NOTE — Patient Instructions (Signed)
As instructed by Dr. Cyndie Chime: Stop Coumadin on 08/02/11. PT/INR here on 08/05/11.  <<Pt may need Lovenox here on 08/05/11.  MD would like pt to stay in office until INR reported to Dr. Cyndie Chime.>> Cervical Myelogram on 08/06/11 at [facility]. Restart coumadin evening of procedure at usual dose. Restart Lovenox (1.5 mg/kg/day = 170 mg/day) on 08/07/11 & continue until INR therapeutic.

## 2011-07-31 ENCOUNTER — Telehealth: Payer: Self-pay | Admitting: *Deleted

## 2011-07-31 ENCOUNTER — Telehealth: Payer: Self-pay | Admitting: Oncology

## 2011-07-31 ENCOUNTER — Emergency Department (HOSPITAL_COMMUNITY)
Admission: EM | Admit: 2011-07-31 | Discharge: 2011-07-31 | Disposition: A | Discharge status: Home / Self Care | Payer: Medicare Other | Attending: Emergency Medicine | Admitting: Emergency Medicine

## 2011-07-31 ENCOUNTER — Encounter (HOSPITAL_COMMUNITY): Payer: Self-pay | Admitting: Emergency Medicine

## 2011-07-31 DIAGNOSIS — F17200 Nicotine dependence, unspecified, uncomplicated: Secondary | ICD-10-CM | POA: Insufficient documentation

## 2011-07-31 DIAGNOSIS — G8929 Other chronic pain: Secondary | ICD-10-CM | POA: Insufficient documentation

## 2011-07-31 DIAGNOSIS — M545 Low back pain, unspecified: Secondary | ICD-10-CM | POA: Insufficient documentation

## 2011-07-31 DIAGNOSIS — M79604 Pain in right leg: Secondary | ICD-10-CM

## 2011-07-31 DIAGNOSIS — M79609 Pain in unspecified limb: Secondary | ICD-10-CM | POA: Insufficient documentation

## 2011-07-31 DIAGNOSIS — Z79899 Other long term (current) drug therapy: Secondary | ICD-10-CM | POA: Insufficient documentation

## 2011-07-31 DIAGNOSIS — I10 Essential (primary) hypertension: Secondary | ICD-10-CM | POA: Insufficient documentation

## 2011-07-31 DIAGNOSIS — Z86718 Personal history of other venous thrombosis and embolism: Secondary | ICD-10-CM | POA: Insufficient documentation

## 2011-07-31 DIAGNOSIS — R209 Unspecified disturbances of skin sensation: Secondary | ICD-10-CM | POA: Insufficient documentation

## 2011-07-31 DIAGNOSIS — K5730 Diverticulosis of large intestine without perforation or abscess without bleeding: Secondary | ICD-10-CM | POA: Insufficient documentation

## 2011-07-31 DIAGNOSIS — Z7901 Long term (current) use of anticoagulants: Secondary | ICD-10-CM | POA: Insufficient documentation

## 2011-07-31 LAB — PROTIME-INR
INR: 2.54 — ABNORMAL HIGH (ref 0.00–1.49)
Prothrombin Time: 27.8 seconds — ABNORMAL HIGH (ref 11.6–15.2)

## 2011-07-31 MED ORDER — DIAZEPAM 5 MG PO TABS: 5.0000 mg | ORAL_TABLET | Freq: Two times a day (BID) | ORAL | Status: AC

## 2011-07-31 MED ORDER — DIAZEPAM 5 MG PO TABS: 5.0000 mg | ORAL_TABLET | Freq: Two times a day (BID) | ORAL | Status: DC

## 2011-07-31 MED ORDER — OXYCODONE-ACETAMINOPHEN 5-325 MG PO TABS: 2.0000 | ORAL_TABLET | ORAL | Status: DC | PRN

## 2011-07-31 MED ORDER — HYDROMORPHONE HCL PF 1 MG/ML IJ SOLN
1.0000 mg | Freq: Once | INTRAMUSCULAR | Status: AC
  Administered 2011-07-31: 1 mg via INTRAVENOUS
  Filled 2011-07-31: qty 1

## 2011-07-31 MED ORDER — HYDROMORPHONE HCL PF 1 MG/ML IJ SOLN
1.0000 mg | Freq: Once | INTRAMUSCULAR | Status: AC
  Administered 2011-07-31: 1 mg via INTRAMUSCULAR
  Filled 2011-07-31: qty 1

## 2011-07-31 MED ORDER — OXYCODONE-ACETAMINOPHEN 5-325 MG PO TABS: 2.0000 | ORAL_TABLET | ORAL | Status: AC | PRN

## 2011-07-31 NOTE — Telephone Encounter (Signed)
Added lb/coumadin clinic appt for 11/26 @ 11 am. D/t per coumadin clinic.

## 2011-07-31 NOTE — Telephone Encounter (Signed)
Received call from pt. Stating that he went to the ED with pain & numbness in his right foot.  He was given something for pain.  He reports that his INR was 2.5 & wanted to know if he should increase his coumadin.  Note to Dr. Cyndie Chime

## 2011-07-31 NOTE — ED Notes (Signed)
Pt reports pain in r/foot radiating up the back of leg to knee. Increased pain over last two weeks. This is a recurrent complain

## 2011-07-31 NOTE — ED Notes (Signed)
Patient reports that he developed right foot pain and numbness for the past several days, reports remote accident and also nerve stimulator in back. Pain with any movement

## 2011-07-31 NOTE — ED Provider Notes (Signed)
History     CSN: [id number] Arrival date & time: 07/31/2011  1:23 PM   First MD Initiated Contact with Patient 07/31/11 1506      No chief complaint on file.   (Consider location/radiation/quality/duration/timing/severity/associated sxs/prior treatment) HPI History provided by pt and his wife.  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  Has had gradually worsening pain/numbness on medial RLE from groin to foot for the past 2 weeks.  Describes numbness as "foot going to sleep".  Aggravated by walking and seems to worsen w/ elevation as well.  No relief w/ percocet prescribed by Dr. August Luz. Pain feels similar to DVTs that he has had in past.  No associated edema or fever and denies trauma.  Pt has a nerve stimulator for chronic low back pain.    Past Medical History  Diagnosis Date  . Hypertension   . History of blood clots   . Diverticulitis   . Hernia   . Diverticulosis     Past Surgical History  Procedure Date  . Back surgery   . Hernia repair   . Hiatal hernia repair     Family History  Problem Relation Age of Onset  . Prostate cancer Father   . Diabetes Mother   . Colon cancer Maternal Uncle   . Pancreatic cancer Paternal Uncle     History  Substance Use Topics  . Smoking status: Current Everyday Smoker  . Smokeless tobacco: Not on file  . Alcohol Use: No      Review of Systems  All other systems reviewed and are negative.    Allergies  Aspirin; Ibuprofen; and Lyrica  Home Medications   Current Outpatient Rx  Name Route Sig Dispense Refill  . CYMBALTA 60 MG PO CPEP Oral Take 120 mg by mouth 2 (two) times daily.     Marland Kitchen METAXALONE 800 MG PO TABS Oral Take 800 mg by mouth 3 (three) times daily.     Marland Kitchen METOPROLOL SUCCINATE 50 MG PO TB24 Oral Take 50 mg by mouth daily.     Marland Kitchen NIFEDICAL XL 60 MG PO TB24 Oral Take 60 mg by mouth daily.     . VOLTAREN 1 % TD GEL Topical Apply 1 application topically 4 (four) times daily as needed. Applied to neck for  neck pain.    . WARFARIN SODIUM 4 MG PO TABS Oral Take 12 mg by mouth daily.        BP 168/88  Pulse 100  Temp 98.3 F (36.8 C)  Resp 16  SpO2 100%  Physical Exam  Nursing note and vitals reviewed. Constitutional: He is oriented to person, place, and time. He appears well-developed and well-nourished. No distress.  HENT:  Head: Normocephalic and atraumatic.  Eyes:       Normal appearance  Neck: Normal range of motion.  Musculoskeletal:       RLE w/out edema.  Tenderness of medial thigh, lower leg and foot.  Foot warm and 2+ DP pulse.  No pain w/ ROM of joints.  Distal sensation intact.  Lumbar spine and right lower back mildly ttp.    Neurological: He is alert and oriented to person, place, and time.  Psychiatric: He has a normal mood and affect. His behavior is normal.    ED Course  Procedures (including critical care time)  Labs Reviewed  PROTIME-INR - Abnormal; Notable for the following:    Prothrombin Time 27.8 (*)    INR 2.54 (*)  All other components within normal limits   No results found.   1. Right leg pain       MDM  Pt w/ h/o chronic RLE DVTs and IVC filter presents w/ RLE pain/numbness that feels similar to past DVTs.  Aggravated by walking and elevating. Diffuse, right medial leg tenderness but no obvious edema on exam.  Lumbar spine and diffuse right lower back ttp.  INR therapeutic.  Pt may have a DVT but venous doppler not ordered because course of tmnt will not change.  Sx sound most consistent with PVD but could also be caused by lumbar radiculopathy.  Pt has chronic low back pain with nerve stimulator.   All of these possibilities discussed w/ pt and I explained that the IVC filter would prevent PE.  Referred back to his hematologist as well as to [facility].      Otilio Miu, [place] 08/01/11 1046

## 2011-08-01 NOTE — ED Provider Notes (Signed)
Medical screening examination/treatment/procedure(s) were conducted as a shared visit with non-physician practitioner(s) and myself.  I personally evaluated the patient during the encounter    Nelia Shi, MD 08/01/11 1106

## 2011-08-05 ENCOUNTER — Ambulatory Visit: Payer: Medicare Other

## 2011-08-05 ENCOUNTER — Other Ambulatory Visit (HOSPITAL_BASED_OUTPATIENT_CLINIC_OR_DEPARTMENT_OTHER): Payer: Medicare Other | Admitting: Lab

## 2011-08-05 ENCOUNTER — Ambulatory Visit: Payer: Self-pay | Admitting: Pharmacist

## 2011-08-05 DIAGNOSIS — I2699 Other pulmonary embolism without acute cor pulmonale: Secondary | ICD-10-CM

## 2011-08-05 DIAGNOSIS — I82409 Acute embolism and thrombosis of unspecified deep veins of unspecified lower extremity: Secondary | ICD-10-CM

## 2011-08-05 LAB — PROTIME-INR
INR: 2.7 (ref 2.00–3.50)
Protime: 32.4 Seconds — ABNORMAL HIGH (ref 10.6–13.4)

## 2011-08-05 LAB — POCT INR: INR: 2.7

## 2011-08-06 ENCOUNTER — Ambulatory Visit
Admission: RE | Admit: 2011-08-06 | Discharge: 2011-08-06 | Disposition: A | Discharge status: Home / Self Care | Payer: Medicare Other | Source: Ambulatory Visit | Attending: Orthopedic Surgery | Admitting: Orthopedic Surgery

## 2011-08-06 ENCOUNTER — Ambulatory Visit: Payer: Medicare Other

## 2011-08-06 ENCOUNTER — Other Ambulatory Visit: Payer: Self-pay | Admitting: Oncology

## 2011-08-06 ENCOUNTER — Ambulatory Visit (HOSPITAL_BASED_OUTPATIENT_CLINIC_OR_DEPARTMENT_OTHER): Payer: Self-pay | Admitting: Pharmacist

## 2011-08-06 ENCOUNTER — Other Ambulatory Visit (HOSPITAL_BASED_OUTPATIENT_CLINIC_OR_DEPARTMENT_OTHER): Payer: Medicare Other | Admitting: Lab

## 2011-08-06 DIAGNOSIS — M542 Cervicalgia: Secondary | ICD-10-CM

## 2011-08-06 DIAGNOSIS — M79603 Pain in arm, unspecified: Secondary | ICD-10-CM

## 2011-08-06 DIAGNOSIS — I82409 Acute embolism and thrombosis of unspecified deep veins of unspecified lower extremity: Secondary | ICD-10-CM

## 2011-08-06 DIAGNOSIS — Z86718 Personal history of other venous thrombosis and embolism: Secondary | ICD-10-CM

## 2011-08-06 DIAGNOSIS — I2699 Other pulmonary embolism without acute cor pulmonale: Secondary | ICD-10-CM

## 2011-08-06 DIAGNOSIS — Z7901 Long term (current) use of anticoagulants: Secondary | ICD-10-CM

## 2011-08-06 DIAGNOSIS — M5030 Other cervical disc degeneration, unspecified cervical region: Secondary | ICD-10-CM

## 2011-08-06 DIAGNOSIS — D689 Coagulation defect, unspecified: Secondary | ICD-10-CM

## 2011-08-06 LAB — POCT INR: INR: 1.5

## 2011-08-06 LAB — PROTIME-INR
INR: 1.5 — ABNORMAL LOW (ref 2.00–3.50)
Protime: 18 Seconds — ABNORMAL HIGH (ref 10.6–13.4)

## 2011-08-06 MED ORDER — DIAZEPAM 5 MG PO TABS
10.0000 mg | ORAL_TABLET | Freq: Once | ORAL | Status: AC
  Administered 2011-08-06: 10 mg via ORAL

## 2011-08-06 MED ORDER — HYDROMORPHONE HCL PF 2 MG/ML IJ SOLN
2.0000 mg | Freq: Once | INTRAMUSCULAR | Status: AC
  Administered 2011-08-06: 2 mg via INTRAMUSCULAR

## 2011-08-06 MED ORDER — ONDANSETRON HCL 4 MG/2ML IJ SOLN
4.0000 mg | Freq: Once | INTRAMUSCULAR | Status: AC
  Administered 2011-08-06: 4 mg via INTRAMUSCULAR

## 2011-08-06 MED ORDER — HYDROMORPHONE HCL 2 MG PO TABS
2.0000 mg | ORAL_TABLET | Freq: Once | ORAL | Status: AC
  Administered 2011-08-06: 2 mg via ORAL

## 2011-08-06 MED ORDER — IOHEXOL 300 MG/ML  SOLN
10.0000 mL | Freq: Once | INTRAMUSCULAR | Status: AC | PRN
  Administered 2011-08-06: 10 mL via INTRATHECAL

## 2011-08-06 NOTE — Progress Notes (Signed)
I spoke w/ Dr. Cyndie Chime & gave him today's INR result.  He feels it is ok for pt to proceed w/ cervical myelogram today as planned. Faxed results of today's INR to [facility] (fax [phone]) attn: Danielle Pt understands plan for bridging his anticoagulation. Marily Lente, Pharm.D.

## 2011-08-06 NOTE — Patient Instructions (Signed)

## 2011-08-06 NOTE — Progress Notes (Signed)
Wife at bedside.  Patient resting comfortably.  jkl

## 2011-08-07 ENCOUNTER — Ambulatory Visit: Payer: Medicare Other

## 2011-08-08 ENCOUNTER — Ambulatory Visit: Payer: Medicare Other

## 2011-08-09 ENCOUNTER — Ambulatory Visit: Payer: Medicare Other

## 2011-08-09 ENCOUNTER — Other Ambulatory Visit: Payer: Medicare Other | Admitting: Lab

## 2011-08-12 ENCOUNTER — Ambulatory Visit: Payer: Medicare Other

## 2011-08-12 ENCOUNTER — Other Ambulatory Visit (HOSPITAL_BASED_OUTPATIENT_CLINIC_OR_DEPARTMENT_OTHER): Payer: Medicare Other | Admitting: Lab

## 2011-08-12 DIAGNOSIS — I2699 Other pulmonary embolism without acute cor pulmonale: Secondary | ICD-10-CM

## 2011-08-12 DIAGNOSIS — I82409 Acute embolism and thrombosis of unspecified deep veins of unspecified lower extremity: Secondary | ICD-10-CM

## 2011-08-12 LAB — PROTIME-INR
INR: 3.7 — ABNORMAL HIGH (ref 2.00–3.50)
Protime: 44.4 Seconds — ABNORMAL HIGH (ref 10.6–13.4)

## 2011-08-12 LAB — POCT INR: INR: 3.7

## 2011-08-14 ENCOUNTER — Ambulatory Visit (INDEPENDENT_AMBULATORY_CARE_PROVIDER_SITE_OTHER): Payer: Medicare Other | Admitting: *Deleted

## 2011-08-14 DIAGNOSIS — R0989 Other specified symptoms and signs involving the circulatory and respiratory systems: Secondary | ICD-10-CM

## 2011-08-14 DIAGNOSIS — I739 Peripheral vascular disease, unspecified: Secondary | ICD-10-CM

## 2011-08-15 ENCOUNTER — Other Ambulatory Visit: Payer: Self-pay | Admitting: *Deleted

## 2011-08-15 DIAGNOSIS — I824Z9 Acute embolism and thrombosis of unspecified deep veins of unspecified distal lower extremity: Secondary | ICD-10-CM

## 2011-08-15 MED ORDER — OMEPRAZOLE 20 MG PO CPDR: 20.0000 mg | DELAYED_RELEASE_CAPSULE | Freq: Every day | ORAL | Status: DC

## 2011-08-16 ENCOUNTER — Ambulatory Visit: Payer: Medicare Other

## 2011-08-16 ENCOUNTER — Other Ambulatory Visit: Payer: Medicare Other | Admitting: Lab

## 2011-08-19 ENCOUNTER — Ambulatory Visit: Payer: Medicare Other

## 2011-08-19 ENCOUNTER — Telehealth: Payer: Self-pay | Admitting: Pharmacist

## 2011-08-19 ENCOUNTER — Other Ambulatory Visit: Payer: Medicare Other | Admitting: Lab

## 2011-08-19 ENCOUNTER — Other Ambulatory Visit: Payer: Self-pay | Admitting: Vascular Surgery

## 2011-08-22 ENCOUNTER — Other Ambulatory Visit (HOSPITAL_BASED_OUTPATIENT_CLINIC_OR_DEPARTMENT_OTHER): Payer: Medicare Other | Admitting: Lab

## 2011-08-22 ENCOUNTER — Ambulatory Visit: Payer: Self-pay | Admitting: Oncology

## 2011-08-22 ENCOUNTER — Ambulatory Visit: Payer: Medicare Other

## 2011-08-22 DIAGNOSIS — I82409 Acute embolism and thrombosis of unspecified deep veins of unspecified lower extremity: Secondary | ICD-10-CM

## 2011-08-22 DIAGNOSIS — I2699 Other pulmonary embolism without acute cor pulmonale: Secondary | ICD-10-CM

## 2011-08-22 LAB — PROTIME-INR
INR: 3 (ref 2.00–3.50)
Protime: 36 Seconds — ABNORMAL HIGH (ref 10.6–13.4)

## 2011-08-22 LAB — POCT INR: INR: 3

## 2011-08-22 NOTE — Progress Notes (Signed)
Pt may have epidural on 09/17/11 with Dr. Cherrie Distance in [place].  Paperwork to be faxed to Dr. Cyndie Chime regarding holding Coumadin.

## 2011-08-28 ENCOUNTER — Telehealth: Payer: Self-pay | Admitting: Pharmacist

## 2011-08-28 ENCOUNTER — Other Ambulatory Visit: Payer: Self-pay | Admitting: *Deleted

## 2011-08-28 DIAGNOSIS — I2699 Other pulmonary embolism without acute cor pulmonale: Secondary | ICD-10-CM

## 2011-08-28 MED ORDER — WARFARIN SODIUM 5 MG PO TABS: 12.5000 mg | ORAL_TABLET | Freq: Every day | ORAL | Status: DC

## 2011-08-29 NOTE — Telephone Encounter (Signed)
Spoke with patient and his wife regarding the epidural steroid injection that he has planned for 09/06/11 with Dr. Shon Baton with [facility].  While giving anticoagulation instructions to [name] [name], she informed me that [name] [name] is scheduled for a second "shot in his back" through Dr. Cherrie Distance with [facility] on 09/17/11.  Informed wife that I would have to re-verify instructions with Dr. Cyndie Chime since these procedures are clustered so closely together.    On further investigation with both the [facility] and [facility], I discovered that he is in fact scheduled for two very similar procedures, and each office was unaware of the other's plans.  The exact procedures to be performed were as follows:  09/06/11: Cervical epidural steroid injection with occipital nerve block (to treat right-sided neck pain)      Dr. Shon Baton, [facility] 09/17/11: L4-L5 Transforaminal epidural steroid injection to the right side with sedation  Dr. Cherrie Distance, [facility]  Dr. Letha Cape office informed me that they would talk to Dr. Shon Baton and decide upon a plan together.  They are to call our office back to inform us of the new plan.

## 2011-08-30 ENCOUNTER — Ambulatory Visit (HOSPITAL_BASED_OUTPATIENT_CLINIC_OR_DEPARTMENT_OTHER): Payer: Self-pay | Admitting: Oncology

## 2011-08-30 DIAGNOSIS — I82409 Acute embolism and thrombosis of unspecified deep veins of unspecified lower extremity: Secondary | ICD-10-CM

## 2011-08-30 DIAGNOSIS — I2699 Other pulmonary embolism without acute cor pulmonale: Secondary | ICD-10-CM

## 2011-08-30 NOTE — Patient Instructions (Addendum)
Last dose of Coumadin 09/11/11. Start Lovenox 09/13/11 (170 mg/day; use 100 mg x 1 + 80 mg x 1 & inject out enough for total dose of 170 mg daily) No Lovenox 09/16/11; come to [facility] for protime draw.  We will provide results for Dr. Letha Cape office. Steroid inj. 09/17/11.  Restart Coumadin evening of 09/17/11. Restart Lovenox 09/18/11 (170 mg/day) Return 09/23/11 to [facility] for protime draw.

## 2011-08-30 NOTE — Progress Notes (Signed)
Pt has decided to have L4-L5 transforaminal epidural steroid injection of his R side (w/ sedation) on 09/17/11 w/ Dr. Charyl Bigger at [facility] at [facility] pain management clinic [phone]). Pt will NOT have nerve block w/ Dr. Shon Baton on 09/06/11 as originally planned.   Pt stated Dr. Shon Baton called him today & he is aware of plan.  I have called Dr. Letha Cape office & left vm informing them of pts decision. I spoke w/ pt & his wife today & gave them instructions on how to bridge anticoag around steroid injection.  He will come off his Coumadin x 5 days prior to injection per Dr. Cyndie Chime. Pt will come to our office on 09/06/11 to pick up 1 box each of Lovenox 100 mg & 80 mg.  I have set these aside in the Coumadin clinic w/ pt name on them. Appts have been made here for 09/16/11 & 09/23/11. Marily Lente, Pharm.D.

## 2011-09-02 ENCOUNTER — Ambulatory Visit: Payer: Self-pay | Admitting: Oncology

## 2011-09-02 NOTE — Progress Notes (Signed)
I explained to wife that Dr. Cyndie Chime prefers pt be off Coumadin for 5 days vs 7 days. Spoke with pt wife regarding anticoagulation plans around procedure. She wrote the instructions down and read them back to me.  Last dose of Coumadin on 09/11/11 Start Lovenox on 09/13/11 (170mg /day; Use 100mg  x 1 and 80mg  x 1 and inject out enough for a total dose of 170mg  daily). No Lovenox on 09/16/11; come to [facility] for protime draw. Pharmacist will provide results for Dr. Letha Cape office. Steroid injection on 09/17/11. Restart Coumadin on the evening of 09/17/11. Restart Lovenox 09/18/11 (170mg /day). Return 09/23/11 to [facility] for lab. Appointments for 09/16/11 and 09/23/11 are made.  ++++++++++++++++Pt will pick up Lovenox on 09/11/11.+++++++++++++++++++++

## 2011-09-05 ENCOUNTER — Ambulatory Visit: Payer: Medicare Other

## 2011-09-05 ENCOUNTER — Other Ambulatory Visit: Payer: Medicare Other | Admitting: Lab

## 2011-09-05 NOTE — Progress Notes (Signed)
thanks

## 2011-09-11 ENCOUNTER — Other Ambulatory Visit: Payer: Self-pay

## 2011-09-11 ENCOUNTER — Telehealth: Payer: Self-pay | Admitting: Pharmacist

## 2011-09-11 DIAGNOSIS — I824Z9 Acute embolism and thrombosis of unspecified deep veins of unspecified distal lower extremity: Secondary | ICD-10-CM

## 2011-09-11 MED ORDER — OMEPRAZOLE 20 MG PO CPDR: 20.0000 mg | DELAYED_RELEASE_CAPSULE | Freq: Every day | ORAL | Status: DC

## 2011-09-11 NOTE — Telephone Encounter (Signed)
Received a phone call from pt wife stating that [name] [name] daughter passed away and he is not in [place]. He may be unable to pick up his Lovenox injections to start on  09/13/11. I offered to call in a Lovenox prescription to a pharmacy near [name] [name], if needed. [name] [name] wife stated the patient may reschedule his procedure scheduled on 09/17/11 if unable to get the Lovenox or if he is still out of town for his daugher's cremation service.  She will call us back to let us know the final decision on if a Lovenox prescription is needed to be called into a pharmacy for [name] [name] or if his procedure is to be rescheduled.

## 2011-09-16 ENCOUNTER — Ambulatory Visit: Payer: Medicare Other

## 2011-09-16 ENCOUNTER — Other Ambulatory Visit (HOSPITAL_BASED_OUTPATIENT_CLINIC_OR_DEPARTMENT_OTHER): Payer: Medicare Other | Admitting: Lab

## 2011-09-16 ENCOUNTER — Ambulatory Visit: Payer: Self-pay | Admitting: Oncology

## 2011-09-16 ENCOUNTER — Telehealth: Payer: Self-pay | Admitting: Pharmacist

## 2011-09-16 DIAGNOSIS — I82409 Acute embolism and thrombosis of unspecified deep veins of unspecified lower extremity: Secondary | ICD-10-CM

## 2011-09-16 DIAGNOSIS — I2699 Other pulmonary embolism without acute cor pulmonale: Secondary | ICD-10-CM

## 2011-09-16 LAB — PROTIME-INR
INR: 1.2 — ABNORMAL LOW (ref 2.00–3.50)
Protime: 14.4 Seconds — ABNORMAL HIGH (ref 10.6–13.4)

## 2011-09-16 LAB — POCT INR: INR: 1.2

## 2011-09-16 NOTE — Telephone Encounter (Signed)
Spoke with Dr. Letha Cape office (pain management specialist at [facility], phone # [phone]).  Confirmed that pt's epidural steroid injection has been rescheduled to 10/01/11.  Per Dr. Patsy Lager recommendations, will hold Coumadin for a total of 5 days.  Called patient's wife to provide her with updated instructions surrounding the procedure:  Wednesday, 09/25/11: Last dose Coumadin. Thursday, 09/26/11: Hold Coumadin.  Start Lovenox 170mg  SQ daily. Monday, 09/30/11: Hold Lovenox.  INR at [facility] at 11am.  Will send results to Dr. Letha Cape office. Tuesday, 10/01/11: Steroid injection. Restart Coumadin in the evening. Monday, 10/07/11: INR at [facility] at 11am.  She read back instructions, and confirmed understanding.  [name] [name] was provided Lovenox syringes today (1 box of 100mg , 1 box of 80mg ) since his INR was too low, but he will likely need more syringes to surround his procedure.  Will provide more at his next Coumadin clinic appointment, as needed.

## 2011-09-16 NOTE — Progress Notes (Unsigned)
INR subtherapeutic.  Likely due to multiple missed doses over the last week.  He has been out of town for the funeral of his daughter, and understandably has missed doses.  Will give 1x dose of 17.5mg , then resume 12.5mg  daily.  Will start Lovenox 170mg  SQ daily.  Will verify with Dr. Letha Cape office the new date of his procedure, and call patient's wife with modified anticoagulation instructions.

## 2011-09-20 ENCOUNTER — Ambulatory Visit: Payer: Medicare Other

## 2011-09-20 ENCOUNTER — Other Ambulatory Visit: Payer: Self-pay | Admitting: Pharmacist

## 2011-09-20 ENCOUNTER — Other Ambulatory Visit: Payer: Medicare Other | Admitting: Lab

## 2011-09-20 DIAGNOSIS — I82409 Acute embolism and thrombosis of unspecified deep veins of unspecified lower extremity: Secondary | ICD-10-CM

## 2011-09-20 NOTE — Progress Notes (Signed)
Noted  G

## 2011-09-20 NOTE — Progress Notes (Unsigned)
Pt missed Coumadin clinic appt today.  We may have been able to d/c his Lovenox if his INR had returned to [place] by today. I called pt at his home & he sounded very sleepy.  I talked to his wife instead. She confirmed that [name] [name] had been dosing his Lovenox since 09/16/11 as instructed. She promised to have [name] [name] here Monday 09/23/11 for 11 am lab & 11:15 am Coumadin clinic. At this appt, we will need to give pt more Lovenox to get him through post-steroid injection.  He should have 3 each of 100's & 80's at home as of Monday. Marily Lente, Pharm.D.

## 2011-09-23 ENCOUNTER — Other Ambulatory Visit (HOSPITAL_BASED_OUTPATIENT_CLINIC_OR_DEPARTMENT_OTHER): Payer: Medicare Other | Admitting: Lab

## 2011-09-23 ENCOUNTER — Other Ambulatory Visit: Payer: Medicare Other

## 2011-09-23 ENCOUNTER — Ambulatory Visit: Payer: Medicare Other

## 2011-09-23 ENCOUNTER — Ambulatory Visit: Payer: Self-pay | Admitting: Oncology

## 2011-09-23 DIAGNOSIS — I82409 Acute embolism and thrombosis of unspecified deep veins of unspecified lower extremity: Secondary | ICD-10-CM

## 2011-09-23 DIAGNOSIS — I2699 Other pulmonary embolism without acute cor pulmonale: Secondary | ICD-10-CM

## 2011-09-23 LAB — PROTIME-INR
INR: 1.1 — ABNORMAL LOW (ref 2.00–3.50)
Protime: 13.2 Seconds (ref 10.6–13.4)

## 2011-09-23 LAB — POCT INR: INR: 1.1

## 2011-09-23 NOTE — Patient Instructions (Signed)
Begin holding Coumadin starting today since INR = 1.1. (Pt was originally scheduled to stop Coumadin on 09/25/11). Continue Lovenox through 09/29/11.  09/30/11 = No Lovenox. 09/30/11 = RTC to check INR. Fax results to Dr. Cherrie Distance. 10/01/11 = Epidural 10/01/11 evening = Resume Coumadin at 12.5mg  daily. 10/02/11 = Restart Lovenox Continue both Coumadin and Lovenox until next lab/clinic visit on 10/07/11 @ 11am. Pt given additional Lovenox injections to cover before and after epidural.

## 2011-09-23 NOTE — Progress Notes (Unsigned)
Pt will hold Coumadin starting today since INR = 1.1. (Pt was originally scheduled to stop Coumadin on 09/25/11). Continue Lovenox through 09/29/11.  09/30/11 = No Lovenox. 09/30/11 = RTC to check INR. Fax results to Dr. Cherrie Distance. 10/01/11 = Epidural 10/01/11 evening = Resume Coumadin at 12.5mg  daily. 10/02/11 = Restart Lovenox Continue both Coumadin and Lovenox until next lab/clinic visit on 10/07/11 @ 11am. Pt given additional Lovenox injections to cover before and after epidural.

## 2011-09-30 ENCOUNTER — Ambulatory Visit (HOSPITAL_BASED_OUTPATIENT_CLINIC_OR_DEPARTMENT_OTHER): Payer: Medicare Other | Admitting: Pharmacist

## 2011-09-30 ENCOUNTER — Other Ambulatory Visit: Payer: Medicare Other

## 2011-09-30 DIAGNOSIS — I82409 Acute embolism and thrombosis of unspecified deep veins of unspecified lower extremity: Secondary | ICD-10-CM

## 2011-09-30 DIAGNOSIS — I2699 Other pulmonary embolism without acute cor pulmonale: Secondary | ICD-10-CM

## 2011-09-30 LAB — PROTIME-INR
INR: 1 — ABNORMAL LOW (ref 2.00–3.50)
Protime: 12 Seconds (ref 10.6–13.4)

## 2011-09-30 LAB — POCT INR: INR: 1

## 2011-09-30 NOTE — Patient Instructions (Signed)
10/01/11 evening = Resume Coumadin at 12.5mg  daily. 10/02/11 = Restart Lovenox 170 mg daily Return 10/07/11 at 11 am for lab; 11:15 am for Coumadin clinic. If you run out of Lovenox injections call us [phone] or [phone].

## 2011-09-30 NOTE — Progress Notes (Signed)
Pt main complaint today is pain in his back.  He is extremely frustrated at his MD re: pain control. He also thinks that he needs to see a different psychiatrist besides Dr. Noe Gens.  He feels at times he could "hurt someone."  He states he cried last night w/ his wife but says he would never hurt her or his step-daughter. He gets his epidural tomorrow w/ Dr. Cherrie Distance.  I will fax his INR result from today to their office (fax [phone])- note their office is closed today for MLK. I explained to Mr. Cedar that he needs to talk to Dr. Cherrie Distance about pain control issues.  He plans to do so. Mr. Johannes also plans to visit his PCP's office today & request, in person, a referral to a psychiatrist. Plans move along w/ bridging anticoag. & pt given written instructions today to resume Coumadin 12.5 mg/day tomorrow after epidural.  Lovenox 170 mg/day to start on Wednesday.  He has enough Lovenox syringes for post-procedure. We will repeat protime on 10/07/11 (already scheduled). Marily Lente, Pharm.D.

## 2011-10-07 ENCOUNTER — Ambulatory Visit (HOSPITAL_BASED_OUTPATIENT_CLINIC_OR_DEPARTMENT_OTHER): Payer: Medicare Other | Admitting: Pharmacist

## 2011-10-07 ENCOUNTER — Other Ambulatory Visit (HOSPITAL_BASED_OUTPATIENT_CLINIC_OR_DEPARTMENT_OTHER): Payer: Medicare Other | Admitting: Lab

## 2011-10-07 DIAGNOSIS — I82409 Acute embolism and thrombosis of unspecified deep veins of unspecified lower extremity: Secondary | ICD-10-CM

## 2011-10-07 DIAGNOSIS — I2699 Other pulmonary embolism without acute cor pulmonale: Secondary | ICD-10-CM

## 2011-10-07 LAB — PROTIME-INR
INR: 1.5 — ABNORMAL LOW (ref 2.00–3.50)
Protime: 18 Seconds — ABNORMAL HIGH (ref 10.6–13.4)

## 2011-10-07 LAB — POCT INR: INR: 1.5

## 2011-10-07 NOTE — Progress Notes (Signed)
Consider 14 mg daily - will reach steady state sooner  DrG

## 2011-10-07 NOTE — Progress Notes (Signed)
Consider 14 mg daily - will reach steady state sooner  DrG 

## 2011-10-07 NOTE — Progress Notes (Signed)
INR subtherapeutic after 6 days of therapy (1.5).  No problems with bleeding/bruising.  No missed doses of Coumadin or Lovenox.  Only complaint is that his stomach is sore from the shots.  Will continue Lovenox 170mg  daily, and increase Coumadin dose slightly to 12.5mg  daily except 15mg  on MTh.   Recheck INR in 4 days.

## 2011-10-11 ENCOUNTER — Other Ambulatory Visit (HOSPITAL_BASED_OUTPATIENT_CLINIC_OR_DEPARTMENT_OTHER): Payer: Medicare Other | Admitting: Lab

## 2011-10-11 ENCOUNTER — Ambulatory Visit (HOSPITAL_BASED_OUTPATIENT_CLINIC_OR_DEPARTMENT_OTHER): Payer: Self-pay | Admitting: Pharmacist

## 2011-10-11 DIAGNOSIS — I82409 Acute embolism and thrombosis of unspecified deep veins of unspecified lower extremity: Secondary | ICD-10-CM

## 2011-10-11 DIAGNOSIS — I2699 Other pulmonary embolism without acute cor pulmonale: Secondary | ICD-10-CM

## 2011-10-11 LAB — PROTIME-INR
INR: 2.6 (ref 2.00–3.50)
Protime: 31.2 Seconds — ABNORMAL HIGH (ref 10.6–13.4)

## 2011-10-11 LAB — POCT INR: INR: 2.6

## 2011-10-11 NOTE — Progress Notes (Signed)
In the course of 4 days, pts INR jumped from 1.5 up to 2.6.  He did not take his Coumadin as instructed; he took 12.5 mg/day; 17.5 mg Mon & Wed. Pt was taking Chantix for approx. 2 weeks but he stopped this on his own because he was having vivid dreams & mood swings.  Chantix does not interact w/ Coumadin. Pt also stated he has stopped taking his Voltaren gel due to his insurance company not allowing refill given risk of bleeding associated with Coumadin therapy. His pain has persisted through the epidural injection he had 10 days ago.  He last smoked marijuana yesterday. Pt seeing his PCP today & plans to re-group on his pain control issues. 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  He is researching ant venom.  He states he may bring in some paperwork next visit to show how it can affect the human body.  He thinks chemicals are responsible for his illness. As far as his Coumadin is concerned, he will go back to his pre-epidural dose of 12.5 mg/day given quick rise in INR. He will discontinue his Lovenox also. Return next Friday for repeat protime. Marily Lente, Pharm.D.

## 2011-10-18 ENCOUNTER — Telehealth: Payer: Self-pay | Admitting: Pharmacist

## 2011-10-18 ENCOUNTER — Other Ambulatory Visit: Payer: Medicare Other | Admitting: Lab

## 2011-10-18 ENCOUNTER — Ambulatory Visit: Payer: Medicare Other

## 2011-10-21 ENCOUNTER — Other Ambulatory Visit: Payer: Medicare Other | Admitting: Lab

## 2011-10-21 ENCOUNTER — Ambulatory Visit (HOSPITAL_BASED_OUTPATIENT_CLINIC_OR_DEPARTMENT_OTHER): Payer: Medicare Other | Admitting: Pharmacist

## 2011-10-21 DIAGNOSIS — I82409 Acute embolism and thrombosis of unspecified deep veins of unspecified lower extremity: Secondary | ICD-10-CM

## 2011-10-21 DIAGNOSIS — I2699 Other pulmonary embolism without acute cor pulmonale: Secondary | ICD-10-CM

## 2011-10-21 LAB — POCT INR: INR: 4.2

## 2011-10-21 LAB — PROTIME-INR
INR: 4.2 — ABNORMAL HIGH (ref 2.00–3.50)
Protime: 50.4 Seconds — ABNORMAL HIGH (ref 10.6–13.4)

## 2011-10-21 NOTE — Progress Notes (Signed)
Pt has been very stable on Coumadin 12.5mg  daily.  Will not change dose because of this reason.  Hold today's Coumadin and resume current dose.  Will check PT/INR next week assess if today's INR an outlier.

## 2011-10-29 ENCOUNTER — Other Ambulatory Visit: Payer: Self-pay | Admitting: Pharmacist

## 2011-10-29 DIAGNOSIS — I2699 Other pulmonary embolism without acute cor pulmonale: Secondary | ICD-10-CM

## 2011-10-29 DIAGNOSIS — I82409 Acute embolism and thrombosis of unspecified deep veins of unspecified lower extremity: Secondary | ICD-10-CM

## 2011-10-30 ENCOUNTER — Encounter (HOSPITAL_COMMUNITY): Payer: Self-pay | Admitting: *Deleted

## 2011-10-30 ENCOUNTER — Emergency Department (HOSPITAL_COMMUNITY)
Admission: EM | Admit: 2011-10-30 | Discharge: 2011-10-30 | Disposition: A | Discharge status: Home / Self Care | Payer: Medicare Other | Attending: Emergency Medicine | Admitting: Emergency Medicine

## 2011-10-30 DIAGNOSIS — M79609 Pain in unspecified limb: Secondary | ICD-10-CM | POA: Insufficient documentation

## 2011-10-30 DIAGNOSIS — Z79899 Other long term (current) drug therapy: Secondary | ICD-10-CM | POA: Insufficient documentation

## 2011-10-30 DIAGNOSIS — M79673 Pain in unspecified foot: Secondary | ICD-10-CM

## 2011-10-30 DIAGNOSIS — I10 Essential (primary) hypertension: Secondary | ICD-10-CM | POA: Insufficient documentation

## 2011-10-30 MED ORDER — PREDNISONE 10 MG PO TABS: 40.0000 mg | ORAL_TABLET | Freq: Every day | ORAL | Status: DC

## 2011-10-30 MED ORDER — HYDROMORPHONE HCL PF 2 MG/ML IJ SOLN
2.0000 mg | Freq: Once | INTRAMUSCULAR | Status: AC
  Administered 2011-10-30: 2 mg via INTRAVENOUS
  Filled 2011-10-30: qty 1

## 2011-10-30 MED ORDER — PREDNISONE 20 MG PO TABS
60.0000 mg | ORAL_TABLET | Freq: Once | ORAL | Status: AC
  Administered 2011-10-30: 60 mg via ORAL
  Filled 2011-10-30: qty 3

## 2011-10-30 NOTE — Discharge Instructions (Signed)
Continue your pain medications at home. Stay off your foot as much as able. Keep it elevated. Take prednisone as prescribed for inflammation. Follow up with your foot specialist as scheduled.

## 2011-10-30 NOTE — ED Provider Notes (Signed)
Medical screening examination/treatment/procedure(s) were performed by non-physician practitioner and as supervising physician I was immediately available for consultation/collaboration.  Doug Sou, MD 10/30/11 [phone]

## 2011-10-30 NOTE — ED Notes (Signed)
Pt reports R foot pain-pt reports that this has been going on for a while.  Moon boot noted on pt's R leg.  Pt ambulates with a limp but with steady gait.  Denies recent injury

## 2011-10-30 NOTE — ED Provider Notes (Signed)
History     CSN: [id number]  Arrival date & time 10/30/11  1248   First MD Initiated Contact with Patient 10/30/11 1351      Chief Complaint  Patient presents with  . Foot Pain    pt c/o right foot pain, pt has had multiple tests performed and has an appt with foot dr on friday. pt here for pain control. states pain is across top of foot. states PCP gave prescription for percocet and no relief with pain medication.     (Consider location/radiation/quality/duration/timing/severity/associated sxs/prior treatment) Patient is a 61 y.o. male presenting with lower extremity pain. The history is provided by the patient.  Foot Pain This is a chronic problem. The current episode started more than 1 month ago. The problem occurs constantly. The problem has been gradually worsening. Associated symptoms include arthralgias and joint swelling. Pertinent negatives include no chills or fever. The symptoms are aggravated by standing, stress, twisting and walking. He has tried oral narcotics and rest for the symptoms. The treatment provided no relief.  Pt states he has a hx of right foot fracture, and a dvt. States since his fracture he has had a deformity in the right foot, swelling, pain with walking. States also has hx of gout. Has an appointment with podiatrist in 2 days, but unable to hand the pain. Taking percocet 10mg  2 tab every 6 hrs with no relief. Denies fever, chills, new injury, redness, current swelling.   Past Medical History  Diagnosis Date  . Hypertension   . History of blood clots   . Diverticulitis   . Hernia   . Diverticulosis     Past Surgical History  Procedure Date  . Back surgery   . Hernia repair   . Hiatal hernia repair     Family History  Problem Relation Age of Onset  . Prostate cancer Father   . Diabetes Mother   . Colon cancer Maternal Uncle   . Pancreatic cancer Paternal Uncle     History  Substance Use Topics  . Smoking status: Current Everyday Smoker  .  Smokeless tobacco: Not on file  . Alcohol Use: No      Review of Systems  Constitutional: Negative for fever and chills.  HENT: Negative.   Eyes: Negative.   Respiratory: Negative.   Cardiovascular: Negative.   Gastrointestinal: Negative.   Genitourinary: Negative.   Musculoskeletal: Positive for joint swelling and arthralgias.  Skin: Negative.   Neurological: Negative.   Psychiatric/Behavioral: Negative.     Allergies  Aspirin; Ibuprofen; and Lyrica  Home Medications   Current Outpatient Rx  Name Route Sig Dispense Refill  . BENAZEPRIL HCL 40 MG PO TABS Oral Take 80 mg by mouth daily.     . CYMBALTA 60 MG PO CPEP Oral Take 120 mg by mouth daily.     Marland Kitchen DIAZEPAM 10 MG PO TABS Oral Take 1 tablet by mouth At bedtime as needed.    . METHOCARBAMOL 750 MG PO TABS Oral Take 750 mg by mouth 3 (three) times daily.    Marland Kitchen METOPROLOL SUCCINATE ER 50 MG PO TB24 Oral Take 100 mg by mouth daily.     Marland Kitchen NIFEDICAL XL 60 MG PO TB24 Oral Take 120 mg by mouth daily.     . OXYCODONE-ACETAMINOPHEN 10-325 MG PO TABS Oral Take 1 tablet by mouth every 6 (six) hours as needed. pain    . VITAMIN D (ERGOCALCIFEROL) 50000 UNITS PO CAPS Oral Take 50,000 Units by mouth every 7 (  seven) days. Every sunday    . VOLTAREN 1 % TD GEL Topical Apply 1 application topically 4 (four) times daily as needed. Applied to neck for neck pain.    . WARFARIN SODIUM 5 MG PO TABS Oral Take 12.5 mg by mouth daily.    . TOBREX 0.3 % OP OINT Both Eyes Place 2 drops into both eyes QID.      BP 177/109  Pulse 65  Temp(Src) 98.6 F (37 C) (Oral)  Resp 19  Ht 6\' 4"  (1.93 m)  Wt 234 lb (106.142 kg)  BMI 28.48 kg/m2  SpO2 100%  Physical Exam  Nursing note and vitals reviewed. Constitutional: He is oriented to person, place, and time. He appears well-developed and well-nourished. No distress.  HENT:  Head: Normocephalic.  Eyes: Conjunctivae are normal.  Neck: Neck supple.  Cardiovascular: Normal rate, regular rhythm and  normal heart sounds.   Pulmonary/Chest: Effort normal and breath sounds normal.  Musculoskeletal: He exhibits edema and tenderness.       Right foot mild swelling. Tender over entire dorsal foot. No redness. Pain with light touch. Swelling over 1st mtp joint, there is a valgus deformity of the toe with a bunion. Normal ankle exam.   Neurological: He is alert and oriented to person, place, and time.  Skin: Skin is warm and dry.  Psychiatric: He has a normal mood and affect.    ED Course  Procedures (including critical care time)  Pt with chronic foot pain, no new injuries, no signs of infection. Already taking pain medications, states no relief. Will try IM pain meds here and steroids. Dilaudid 2mg  IM and prednisone 60mg  given po. Possible gout? Pt has appointment in two days with podiatrist.  No diagnosis found.    MDM          Lottie Mussel, PA 10/30/11 [phone]

## 2011-11-01 ENCOUNTER — Ambulatory Visit: Payer: Medicare Other

## 2011-11-01 ENCOUNTER — Other Ambulatory Visit: Payer: Medicare Other | Admitting: Lab

## 2011-11-04 ENCOUNTER — Ambulatory Visit: Payer: Medicare Other

## 2011-11-04 ENCOUNTER — Other Ambulatory Visit: Payer: Medicare Other | Admitting: Lab

## 2011-11-05 ENCOUNTER — Ambulatory Visit (HOSPITAL_BASED_OUTPATIENT_CLINIC_OR_DEPARTMENT_OTHER): Payer: Medicare Other | Admitting: Pharmacist

## 2011-11-05 ENCOUNTER — Other Ambulatory Visit: Payer: Medicare Other | Admitting: Lab

## 2011-11-05 DIAGNOSIS — I82409 Acute embolism and thrombosis of unspecified deep veins of unspecified lower extremity: Secondary | ICD-10-CM

## 2011-11-05 DIAGNOSIS — I2699 Other pulmonary embolism without acute cor pulmonale: Secondary | ICD-10-CM

## 2011-11-05 LAB — PROTIME-INR
INR: 2.7 (ref 2.00–3.50)
Protime: 32.4 Seconds — ABNORMAL HIGH (ref 10.6–13.4)

## 2011-11-05 LAB — POCT INR: INR: 2.7

## 2011-11-05 NOTE — Progress Notes (Signed)
INR therapeutic on maintenance of Coumadin 12.5 mg daily. Pt seeing PCP today for shoulder/neck pain.  He knows to call us if med changes are made. Cont same dose of Coumadin & return in 1 month. Marily Lente, Pharm.D.

## 2011-11-07 ENCOUNTER — Telehealth: Payer: Self-pay | Admitting: Pharmacist

## 2011-11-07 DIAGNOSIS — I2699 Other pulmonary embolism without acute cor pulmonale: Secondary | ICD-10-CM

## 2011-11-07 NOTE — Telephone Encounter (Signed)
Spoke to pt and pt wife.  Pt wife updated new medications.  The new medications (colchicine, acyclovir for 10 days, and isosorbide) should not interact with warfarin.  Next Coumadin clinic appt scheduled for 12/03/11.

## 2011-11-28 ENCOUNTER — Encounter (HOSPITAL_COMMUNITY): Payer: Self-pay | Admitting: *Deleted

## 2011-11-28 ENCOUNTER — Emergency Department (HOSPITAL_COMMUNITY): Payer: Medicare Other

## 2011-11-28 ENCOUNTER — Other Ambulatory Visit: Payer: Self-pay | Admitting: Pharmacist

## 2011-11-28 ENCOUNTER — Emergency Department (HOSPITAL_COMMUNITY)
Admission: EM | Admit: 2011-11-28 | Discharge: 2011-11-29 | Disposition: A | Discharge status: Home / Self Care | Payer: Medicare Other | Attending: Emergency Medicine | Admitting: Emergency Medicine

## 2011-11-28 DIAGNOSIS — M546 Pain in thoracic spine: Secondary | ICD-10-CM | POA: Insufficient documentation

## 2011-11-28 DIAGNOSIS — H5789 Other specified disorders of eye and adnexa: Secondary | ICD-10-CM | POA: Insufficient documentation

## 2011-11-28 DIAGNOSIS — B019 Varicella without complication: Secondary | ICD-10-CM

## 2011-11-28 DIAGNOSIS — M79609 Pain in unspecified limb: Secondary | ICD-10-CM | POA: Insufficient documentation

## 2011-11-28 DIAGNOSIS — R209 Unspecified disturbances of skin sensation: Secondary | ICD-10-CM | POA: Insufficient documentation

## 2011-11-28 DIAGNOSIS — M549 Dorsalgia, unspecified: Secondary | ICD-10-CM

## 2011-11-28 DIAGNOSIS — Z79899 Other long term (current) drug therapy: Secondary | ICD-10-CM | POA: Insufficient documentation

## 2011-11-28 DIAGNOSIS — I10 Essential (primary) hypertension: Secondary | ICD-10-CM | POA: Insufficient documentation

## 2011-11-28 DIAGNOSIS — M79669 Pain in unspecified lower leg: Secondary | ICD-10-CM

## 2011-11-28 DIAGNOSIS — Z86718 Personal history of other venous thrombosis and embolism: Secondary | ICD-10-CM | POA: Insufficient documentation

## 2011-11-28 HISTORY — DX: Zoster without complications: B02.9

## 2011-11-28 LAB — DIFFERENTIAL
Basophils Absolute: 0 10*3/uL (ref 0.0–0.1)
Basophils Relative: 0 % (ref 0–1)
Eosinophils Absolute: 0.1 10*3/uL (ref 0.0–0.7)
Eosinophils Relative: 1 % (ref 0–5)
Lymphocytes Relative: 43 % (ref 12–46)
Lymphs Abs: 2.2 10*3/uL (ref 0.7–4.0)
Monocytes Absolute: 0.3 10*3/uL (ref 0.1–1.0)
Monocytes Relative: 6 % (ref 3–12)
Neutro Abs: 2.6 10*3/uL (ref 1.7–7.7)
Neutrophils Relative %: 50 % (ref 43–77)

## 2011-11-28 LAB — COMPREHENSIVE METABOLIC PANEL
ALT: 13 U/L (ref 0–53)
AST: 13 U/L (ref 0–37)
Albumin: 3.9 g/dL (ref 3.5–5.2)
Alkaline Phosphatase: 43 U/L (ref 39–117)
BUN: 7 mg/dL (ref 6–23)
CO2: 25 mEq/L (ref 19–32)
Calcium: 9.4 mg/dL (ref 8.4–10.5)
Chloride: 102 mEq/L (ref 96–112)
Creatinine, Ser: 0.81 mg/dL (ref 0.50–1.35)
GFR calc Af Amer: >90 mL/min (ref >90)
GFR calc non Af Amer: >90 mL/min (ref >90)
Glucose, Bld: 101 mg/dL — ABNORMAL HIGH (ref 70–99)
Potassium: 3.4 mEq/L — ABNORMAL LOW (ref 3.5–5.1)
Sodium: 138 mEq/L (ref 135–145)
Total Bilirubin: 0.3 mg/dL (ref 0.3–1.2)
Total Protein: 6.5 g/dL (ref 6.0–8.3)

## 2011-11-28 LAB — CBC
HCT: 39.3 % (ref 39.0–52.0)
Hemoglobin: 13.5 g/dL (ref 13.0–17.0)
MCH: 23.6 pg — ABNORMAL LOW (ref 26.0–34.0)
MCHC: 34.4 g/dL (ref 30.0–36.0)
MCV: 68.8 fL — ABNORMAL LOW (ref 78.0–100.0)
Platelets: 204 10*3/uL (ref 150–400)
RBC: 5.71 MIL/uL (ref 4.22–5.81)
RDW: 15 % (ref 11.5–15.5)
WBC: 5.2 10*3/uL (ref 4.0–10.5)

## 2011-11-28 LAB — URIC ACID: Uric Acid, Serum: 4.1 mg/dL (ref 4.0–7.8)

## 2011-11-28 MED ORDER — ONDANSETRON HCL 4 MG/2ML IJ SOLN
4.0000 mg | Freq: Once | INTRAMUSCULAR | Status: AC
  Administered 2011-11-28: 4 mg via INTRAVENOUS
  Filled 2011-11-28: qty 2

## 2011-11-28 MED ORDER — HYDROMORPHONE HCL PF 1 MG/ML IJ SOLN
1.0000 mg | Freq: Once | INTRAMUSCULAR | Status: AC
  Administered 2011-11-28: 1 mg via INTRAVENOUS
  Filled 2011-11-28: qty 1

## 2011-11-28 MED ORDER — DIPHENHYDRAMINE HCL 50 MG/ML IJ SOLN
INTRAMUSCULAR | Status: AC
  Administered 2011-11-28: 25 mg via INTRAVENOUS
  Filled 2011-11-28: qty 1

## 2011-11-28 MED ORDER — DIPHENHYDRAMINE HCL 50 MG/ML IJ SOLN
25.0000 mg | Freq: Once | INTRAMUSCULAR | Status: AC
  Administered 2011-11-28: 25 mg via INTRAVENOUS

## 2011-11-28 NOTE — ED Notes (Signed)
Pt c/o itching in face, arms, chest, and hands.  Also c/o pain in lower back and feet.  States he has "hot flashes", cold then hot.  Denies n/v, states good appetite.

## 2011-11-28 NOTE — ED Provider Notes (Signed)
History     CSN: [id number]  Arrival date & time 11/28/11  1735   First MD Initiated Contact with Patient 11/28/11 1955      Chief Complaint  Patient presents with  . Herpes Zoster    (Consider location/radiation/quality/duration/timing/severity/associated sxs/prior treatment) The history is provided by the patient.   61 year old male comes in complaining of pain in his back and in both legs. He has had severe pain for the last 2 weeks. The leg pain is worse on the right and goes from his knees all the way down to his toes and all the way around the calf circumferentially. The back pain is in the mid and upper back and bilateral. He describes the back pain as severe burning and is worse with anything touching it, even his clothing. The pain in his legs is described as dull, aching although there are a couple of spots which are exquisitely tender. He went to a foot doctor who had done some tests at about circulation and wanted to have him do nerve conduction studies. He went to his primary care doctor who told him that the back pain was herpes zoster and the leg pain was gout. He was started on Ulorich and Zovirax cream. Symptoms are not improving. He states he has difficulty getting up to all walk on his legs because of the pain. He notices some numbness in the plantar surface of his feet. Symptoms are staying the same-neither getting better nor worse. He denies fever, chills, sweats. He also noted some redness in his eyes and states he was given some medicine by his doctor for his eyes but is not aware that medicine was. Of note, he never had a rash anywhere.  Past Medical History  Diagnosis Date  . Hypertension   . History of blood clots   . Diverticulitis   . Hernia   . Diverticulosis   . Shingles     Past Surgical History  Procedure Date  . Back surgery   . Hernia repair   . Hiatal hernia repair     Family History  Problem Relation Age of Onset  . Prostate cancer Father   .  Diabetes Mother   . Colon cancer Maternal Uncle   . Pancreatic cancer Paternal Uncle     History  Substance Use Topics  . Smoking status: Current Everyday Smoker  . Smokeless tobacco: Not on file  . Alcohol Use: No      Review of Systems  All other systems reviewed and are negative.    Allergies  Aspirin; Celecoxib; Ibuprofen; and Lyrica  Home Medications   Current Outpatient Rx  Name Route Sig Dispense Refill  . ACYCLOVIR 5 % EX OINT Topical Apply 1 application topically every 3 (three) hours.    Marland Kitchen BENAZEPRIL HCL 40 MG PO TABS Oral Take 80 mg by mouth daily.     . COLCHICINE 0.6 MG PO TABS Oral Take 0.6 mg by mouth 2 (two) times daily.    . CYMBALTA 60 MG PO CPEP Oral Take 120 mg by mouth daily.     Marland Kitchen DEXAMETHASONE 0.5 MG PO TABS Oral Take 0.5 mg by mouth 2 (two) times daily with a meal.    . DIAZEPAM 10 MG PO TABS Oral Take 1 tablet by mouth At bedtime as needed.    Marland Kitchen DOCUSATE SODIUM 100 MG PO CAPS Oral Take 100 mg by mouth 2 (two) times daily.    . FEBUXOSTAT 40 MG PO TABS Oral Take  80 mg by mouth daily.    Marland Kitchen HYDROCHLOROTHIAZIDE 25 MG PO TABS Oral Take 25 mg by mouth daily.    Marland Kitchen METHOCARBAMOL 750 MG PO TABS Oral Take 750 mg by mouth 3 (three) times daily.    Marland Kitchen METOPROLOL SUCCINATE ER 50 MG PO TB24 Oral Take 50 mg by mouth daily.     Marland Kitchen NIFEDICAL XL 60 MG PO TB24 Oral Take 120 mg by mouth daily.     . OXYCODONE-ACETAMINOPHEN 10-325 MG PO TABS Oral Take 1 tablet by mouth every 6 (six) hours as needed. pain    . VITAMIN D (ERGOCALCIFEROL) 50000 UNITS PO CAPS Oral Take 50,000 Units by mouth every 7 (seven) days. Every sunday    . VOLTAREN 1 % TD GEL Topical Apply 1 application topically 4 (four) times daily as needed. Applied to neck for neck pain.    . WARFARIN SODIUM 5 MG PO TABS Oral Take 12.5 mg by mouth daily.      BP 140/91  Pulse 77  Temp(Src) 98.6 F (37 C) (Oral)  Resp 20  SpO2 97%  Physical Exam  Nursing note and vitals reviewed.  61 year old male appears  uncomfortable. Vital signs are significant for borderline hypertension with blood pressure 140/91. Oxygen saturation is 97% which is normal. Head is normocephalic and atraumatic. PERRLA, EOMI. There is no scleral icterus and no conjunctival injection. Anterior chambers clear. Oropharynx is clear. Neck is nontender and supple without adenopathy or JVD. Back has no rash. There is exquisite tenderness throughout the posterior chest. There's no warmth or erythema. Lungs are clear to without rales, wheezes, or rhonchi. Heart has regular rate and rhythm without murmur. Abdomen is soft, flat, nontender without masses or hepatosplenomegaly. Extremities: There is no swelling and no edema and no warmth. There is one hyperpigmented area in the right lower leg which is somewhat tender. There no joint effusions and there is full range of motion present without obvious pain. Pulses are strong and capillary refill is prompt Skin is warm and dry without rash. Neurologic: Mental status is normal, cranial nerves are intact. There is decreased pinprick sensation over the feet and lower legs consistent with peripheral neuropathy. Normal sensation begins just above the ankle. There no motor deficits. Deep tendon reflexes are symmetric.  ED Course  Procedures (including critical care time)  Results for orders placed during the hospital encounter of 11/28/11  CBC      Component Value Range   WBC 5.2  4.0 - 10.5 (K/uL)   RBC 5.71  4.22 - 5.81 (MIL/uL)   Hemoglobin 13.5  13.0 - 17.0 (g/dL)   HCT 45.4  09.8 - 11.9 (%)   MCV 68.8 (*) 78.0 - 100.0 (fL)   MCH 23.6 (*) 26.0 - 34.0 (pg)   MCHC 34.4  30.0 - 36.0 (g/dL)   RDW 14.7  82.9 - 56.2 (%)   Platelets 204  150 - 400 (K/uL)  DIFFERENTIAL      Component Value Range   Neutrophils Relative 50  43 - 77 (%)   Lymphocytes Relative 43  12 - 46 (%)   Monocytes Relative 6  3 - 12 (%)   Eosinophils Relative 1  0 - 5 (%)   Basophils Relative 0  0 - 1 (%)   Neutro Abs 2.6  1.7 -  7.7 (K/uL)   Lymphs Abs 2.2  0.7 - 4.0 (K/uL)   Monocytes Absolute 0.3  0.1 - 1.0 (K/uL)   Eosinophils Absolute 0.1  0.0 -  0.7 (K/uL)   Basophils Absolute 0.0  0.0 - 0.1 (K/uL)   RBC Morphology TARGET CELLS    COMPREHENSIVE METABOLIC PANEL      Component Value Range   Sodium 138  135 - 145 (mEq/L)   Potassium 3.4 (*) 3.5 - 5.1 (mEq/L)   Chloride 102  96 - 112 (mEq/L)   CO2 25  19 - 32 (mEq/L)   Glucose, Bld 101 (*) 70 - 99 (mg/dL)   BUN 7  6 - 23 (mg/dL)   Creatinine, Ser 1.61  0.50 - 1.35 (mg/dL)   Calcium 9.4  8.4 - 09.6 (mg/dL)   Total Protein 6.5  6.0 - 8.3 (g/dL)   Albumin 3.9  3.5 - 5.2 (g/dL)   AST 13  0 - 37 (U/L)   ALT 13  0 - 53 (U/L)   Alkaline Phosphatase 43  39 - 117 (U/L)   Total Bilirubin 0.3  0.3 - 1.2 (mg/dL)   GFR calc non Af Amer >90  >90 (mL/min)   GFR calc Af Amer >90  >90 (mL/min)  URIC ACID      Component Value Range   Uric Acid, Serum 4.1  4.0 - 7.8 (mg/dL)  SEDIMENTATION RATE      Component Value Range   Sed Rate 0  0 - 16 (mm/hr)   Dg Chest 2 View  11/29/2011  *RADIOLOGY REPORT*  Clinical Data: Herpes zoster infection.  CHEST - 2 VIEW  Comparison: Chest radiograph performed 03/29/2010  Findings: The lungs are well-aerated and clear.  There is no evidence of focal opacification, pleural effusion or pneumothorax.  The heart is normal in size; the mediastinal contour is within normal limits.  No acute osseous abnormalities are seen.  Spinal stimulation leads are partially imaged.  Clips are noted within the right upper quadrant, reflecting prior cholecystectomy.  IMPRESSION: No acute cardiopulmonary process seen.  Original Report Authenticated By: Tonia Ghent, M.D.   Workup has been unrevealing. His uric acid is low normal of which would argue strongly against gout. His sedimentation rate is 0 which argue strongly against polymyalgia rheumatica. 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 At this point, I feel that his leg pain is due to a peripheral neuropathy although the source of that is not clear. I do not know what is causing his upper back pain. I am recommending that he discontinue his medications for gout and shingles. He did get pain relief in the emergency department from hydromorphone and will be given a prescription for that to take at home. He is given a referral to Dr. Corliss Skains for evaluation from a rheumatology standpoint.    1. Lower leg pain   2. Pain in back       MDM  Back pain and leg pain without clinical evidence of either gallops or herpes zoster. The pain in his back is typical of gout in regard to the quality of pain, but it is present over area much larger than the dermatome and it is present bilaterally. Also, no rash is present even though it has been there for 2 weeks. Leg symptoms have no the hallmarks of gout. There is no warmth, no erythema, and no swelling. I suspect that some of his leg symptoms are due to peripheral neuropathy. Workup will be done including sedimentation rate to consider polymyalgia rheumatica, uric acid level to evaluate possible gout. You also be given analgesics  for pain control.        Dione Booze, MD 11/29/11 [phone]

## 2011-11-28 NOTE — ED Notes (Signed)
No open rash.  Pt is in room with room closed at this time.  Will check with charge regarding need for negative pressure room

## 2011-11-28 NOTE — ED Notes (Signed)
Pt has been having a painful rash for a week and he was seen by his MD and was told that he has shingles.  Painful across back.  No visible blisters at this time but has pain in area on back and on eyelids.

## 2011-11-29 LAB — SEDIMENTATION RATE: Sed Rate: 0 mm/hr (ref 0–16)

## 2011-11-29 MED ORDER — DIPHENHYDRAMINE HCL 25 MG PO CAPS
25.0000 mg | ORAL_CAPSULE | Freq: Once | ORAL | Status: AC
  Administered 2011-11-29: 25 mg via ORAL
  Filled 2011-11-29: qty 1

## 2011-11-29 MED ORDER — HYDROMORPHONE HCL 2 MG PO TABS: 2.0000 mg | ORAL_TABLET | ORAL | Status: AC | PRN

## 2011-11-29 MED ORDER — HYDROMORPHONE HCL 2 MG PO TABS
2.0000 mg | ORAL_TABLET | ORAL | Status: AC
  Administered 2011-11-29: 2 mg via ORAL
  Filled 2011-11-29: qty 1

## 2011-11-29 NOTE — Discharge Instructions (Signed)
Your testing did not show an obvious cause for your pain. I do not believe that you have gout and I do not believe that you have shingles. I recommend stop taking the medication for gout and stopped taking the medication for shingles. I have given you the name of a rheumatologist who hopefully would be able to find the cause for your pain and start appropriate treatment.  Hydromorphone tablets What is this medicine? HYDROMORPHONE (hye droe MOR fone) is a pain reliever. It is used to treat moderate to severe pain. This medicine may be used for other purposes; ask your health care provider or pharmacist if you have questions. What should I tell my health care provider before I take this medicine? They need to know if you have any of these conditions: -brain tumor -drug abuse or addiction -head injury -heart disease -frequently drink alcohol containing drinks -kidney disease or problems going to the bathroom -liver disease -lung disease, asthma, or breathing problems -mental problems -an allergic or unusual reaction to lactose, hydromorphone, other opioid analgesics, other medicines, sulfites, foods, dyes, or preservatives -pregnant or trying to get pregnant -breast-feeding How should I use this medicine? Take this medicine by mouth with a glass of water. If the medicine upsets your stomach, take it with food or milk. Follow the directions on the prescription label. Do not take more medicine than you are told to take. Talk to your pediatrician regarding the use of this medicine in children. Special care may be needed. Overdosage: If you think you have taken too much of this medicine contact a poison control center or emergency room at once. NOTE: This medicine is only for you. Do not share this medicine with others. What if I miss a dose? If you miss a dose, take it as soon as you can. If it is almost time for your next dose, take only that dose. Do not take double or extra doses. What may  interact with this medicine? -alcohol -antihistamines for allergy, cough and cold -medicines for anesthesia -medicines for depression, anxiety, or psychotic disturbances -medicines for sleep -muscle relaxants -naltrexone -narcotic medicines for pain -phenothiazines like chlorpromazine, mesoridazine, prochlorperazine, thioridazine This list may not describe all possible interactions. Give your health care provider a list of all the medicines, herbs, non-prescription drugs, or dietary supplements you use. Also tell them if you smoke, drink alcohol, or use illegal drugs. Some items may interact with your medicine. What should I watch for while using this medicine? Tell your doctor or health care professional if your pain does not go away, if it gets worse, or if you have new or a different type of pain. You may develop tolerance to the medicine. Tolerance means that you will need a higher dose of the medicine for pain relief. Tolerance is normal and is expected if you take this medicine for a long time. Do not suddenly stop taking your medicine because you may develop a severe reaction. Your body becomes used to the medicine. This does NOT mean you are addicted. Addiction is a behavior related to getting and using a drug for a non-medical reason. If you have pain, you have a medical reason to take pain medicine. Your doctor will tell you how much medicine to take. If your doctor wants you to stop the medicine, the dose will be slowly lowered over time to avoid any side effects. You may get drowsy or dizzy. Do not drive, use machinery, or do anything that needs mental alertness until you  know how this medicine affects you. Do not stand or sit up quickly, especially if you are an older patient. This reduces the risk of dizzy or fainting spells. Alcohol may interfere with the effect of this medicine. Avoid alcoholic drinks. This medicine will cause constipation. Try to have a bowel movement at least every 2  to 3 days. If you do not have a bowel movement for 3 days, call your doctor or health care professional. Your mouth may get dry. Chewing sugarless gum or sucking hard candy, and drinking plenty of water may help. Contact your doctor if the problem does not go away or is severe. What side effects may I notice from receiving this medicine? Side effects that you should report to your doctor or health care professional as soon as possible: -allergic reactions like skin rash, itching or hives, swelling of the face, lips, or tongue -breathing problems -changes in vision -confusion -feeling faint or lightheaded, falls -seizures -slow or fast heartbeat -trouble passing urine or change in the amount of urine -trouble with balance, talking, walking Side effects that usually do not require medical attention (report to your doctor or health care professional if they continue or are bothersome): -difficulty sleeping -drowsiness -dry mouth -flushing -headache -itching -loss of appetite -nausea, vomiting This list may not describe all possible side effects. Call your doctor for medical advice about side effects. You may report side effects to FDA at 1-800-FDA-1088. Where should I keep my medicine? Keep out of the reach of children. This medicine can be abused. Keep your medicine in a safe place to protect it from theft. Do not share this medicine with anyone. Selling or giving away this medicine is dangerous and against the law. Store at room temperature between 15 and 30 degrees C (59 and 86 degrees F). Keep container tightly closed. Protect from light. Flush any unused medicines down the toilet. Do not use the medicine after the expiration date. NOTE: This sheet is a summary. It may not cover all possible information. If you have questions about this medicine, talk to your doctor, pharmacist, or health care provider.  2012, [employer]. (07/22/2008 10:24:26 AM).  Pain of Unknown Etiology  (Pain Without a Known Cause) You have come to your caregiver because of pain. Pain can occur in any part of the body. Often there is not a definite cause. If your laboratory (blood or urine) work was normal and x-rays or other studies were normal, your caregiver may treat you without knowing the cause of the pain. An example of this is the headache. Most headaches are diagnosed by taking a history. This means your caregiver asks you questions about your headaches. Your caregiver determines a treatment based on your answers. Usually testing done for headaches is normal. Often testing is not done unless there is no response to medications. Regardless of where your pain is located today, you can be given medications to make you comfortable. If no physical cause of pain can be found, most cases of pain will gradually leave as suddenly as they came.  If you have a painful condition and no reason can be found for the pain, It is importantthat you follow up with your caregiver. If the pain becomes worse or does not go away, it may be necessary to repeat tests and look further for a possible cause.  Only take over-the-counter or prescription medicines for pain, discomfort, or fever as directed by your caregiver.   For the protection of your privacy, test results  can not be given over the phone. Make sure you receive the results of your test. Ask as to how these results are to be obtained if you have not been informed. It is your responsibility to obtain your test results.   You may continue all activities unless the activities cause more pain. When the pain lessens, it is important to gradually resume normal activities. Resume activities by beginning slowly and gradually increasing the intensity and duration of the activities or exercise. During periods of severe pain, bed-rest may be helpful. Lay or sit in any position that is comfortable.   Ice used for acute (sudden) conditions may be effective. Use a large  plastic bag filled with ice and wrapped in a towel. This may provide pain relief.   See your caregiver for continued problems. They can help or refer you for exercises or physical therapy if necessary.  If you were given medications for your condition, do not drive, operate machinery or power tools, or sign legal documents for 24 hours. Do not drink alcohol, take sleeping pills, or take other medications that may interfere with treatment. See your caregiver immediately if you have pain that is becoming worse and not relieved by medications. Document Released: 05/21/2001 Document Revised: 08/15/2011 Document Reviewed: 08/26/2005 [facility] Patient Information 2012 [place], [place].

## 2011-11-29 NOTE — ED Notes (Signed)
Rx x 1, pt voiced understanding to f/u with rheumatologist.

## 2011-12-03 ENCOUNTER — Ambulatory Visit: Payer: Medicare Other

## 2011-12-03 ENCOUNTER — Other Ambulatory Visit: Payer: Medicare Other | Admitting: Lab

## 2011-12-03 ENCOUNTER — Other Ambulatory Visit: Payer: Self-pay | Admitting: *Deleted

## 2011-12-03 DIAGNOSIS — I2699 Other pulmonary embolism without acute cor pulmonale: Secondary | ICD-10-CM

## 2011-12-03 MED ORDER — WARFARIN SODIUM 5 MG PO TABS
12.5000 mg | ORAL_TABLET | Freq: Every day | ORAL | Status: DC
Start: 2011-12-03 — End: 2011-12-18

## 2011-12-09 ENCOUNTER — Ambulatory Visit: Payer: Medicare Other

## 2011-12-09 ENCOUNTER — Other Ambulatory Visit: Payer: Medicare Other

## 2011-12-10 ENCOUNTER — Other Ambulatory Visit: Payer: Self-pay | Admitting: Pharmacist

## 2011-12-10 ENCOUNTER — Telehealth: Payer: Self-pay | Admitting: Pharmacist

## 2011-12-10 MED ORDER — ACYCLOVIR 5 % EX OINT: TOPICAL_OINTMENT | CUTANEOUS | Status: DC

## 2011-12-10 MED ORDER — FEBUXOSTAT 80 MG PO TABS: 80.0000 mg | ORAL_TABLET | Freq: Every day | ORAL | Status: DC

## 2011-12-10 MED ORDER — ACYCLOVIR 400 MG PO TABS: 400.0000 mg | ORAL_TABLET | ORAL | Status: AC

## 2011-12-12 ENCOUNTER — Ambulatory Visit (HOSPITAL_BASED_OUTPATIENT_CLINIC_OR_DEPARTMENT_OTHER): Payer: Medicare Other | Admitting: Lab

## 2011-12-12 ENCOUNTER — Ambulatory Visit (HOSPITAL_BASED_OUTPATIENT_CLINIC_OR_DEPARTMENT_OTHER): Payer: Medicare Other | Admitting: Pharmacist

## 2011-12-12 DIAGNOSIS — I2699 Other pulmonary embolism without acute cor pulmonale: Secondary | ICD-10-CM

## 2011-12-12 DIAGNOSIS — Z7901 Long term (current) use of anticoagulants: Secondary | ICD-10-CM

## 2011-12-12 DIAGNOSIS — I82409 Acute embolism and thrombosis of unspecified deep veins of unspecified lower extremity: Secondary | ICD-10-CM

## 2011-12-12 LAB — PROTIME-INR

## 2011-12-12 LAB — POCT INR
INR: 6.29
INR: 6.3

## 2011-12-12 LAB — PROTHROMBIN TIME
INR: 6.29 (ref <1.50)
Prothrombin Time: 56.4 seconds — ABNORMAL HIGH (ref 11.6–15.2)

## 2011-12-12 NOTE — Patient Instructions (Signed)
Hold Coumadin today (4/4), tomorrow (4/5) and Saturday (4/6). Take Coumadin 10mg  on Sunday (4/7). Recheck INR on Monday, 4/8 @ 11:15am lab and 11:30am clinic.

## 2011-12-12 NOTE — Progress Notes (Signed)
Hold Coumadin today (4/4), tomorrow (4/5) and Saturday (4/6). Take Coumadin 10mg on Sunday (4/7). Recheck INR on Monday, 4/8 @ 11:15am lab and 11:30am clinic. 

## 2011-12-16 ENCOUNTER — Other Ambulatory Visit: Payer: Medicare Other | Admitting: Lab

## 2011-12-16 ENCOUNTER — Ambulatory Visit (HOSPITAL_BASED_OUTPATIENT_CLINIC_OR_DEPARTMENT_OTHER): Payer: Medicare Other | Admitting: Pharmacist

## 2011-12-16 DIAGNOSIS — I2699 Other pulmonary embolism without acute cor pulmonale: Secondary | ICD-10-CM

## 2011-12-16 DIAGNOSIS — I82409 Acute embolism and thrombosis of unspecified deep veins of unspecified lower extremity: Secondary | ICD-10-CM

## 2011-12-16 LAB — PROTIME-INR
INR: 1.2 — ABNORMAL LOW (ref 2.00–3.50)
Protime: 14.4 Seconds — ABNORMAL HIGH (ref 10.6–13.4)

## 2011-12-16 LAB — POCT INR: INR: 1.2

## 2011-12-16 NOTE — Progress Notes (Signed)
INR subtherapeutic today (1.2).  He skipped 3 doses as instructed, but misunderstood directions to start Coumadin on Sunday, so he skipped Sunday as well.  His medication list was updated - several of his HTN medications have been dose escalated by his PCP, and he also started Benazepril.  He does not complain of any bleeding or bruising. His shingles symptoms are abating.  No changes in diet noted.  Will restart Coumadin today at 10mg  daily (the addition of dexamethasone likely caused his previous INR increase, so will restart at a slightly lower dose).  Also will have the patient start Lovenox 170mg  SQ daily until his INR returns to therapeutic range.  Pt communicated understanding. Provided 2 syringes each of 100mg  and 80mg  Lovenox to augment the patient's own supply until he returns for another INR on Friday, 12/20/11.

## 2011-12-17 ENCOUNTER — Telehealth: Payer: Self-pay | Admitting: Pharmacist

## 2011-12-17 NOTE — Telephone Encounter (Signed)
Patient called and said he gave himself Lovenox 150mg  today instead of 170mg . (He gave himself the full 80mg  (0.76mL) syringe and 70mg  (0.67mL) from the 100mg  syringe. He wanted to let us know and was wondering if he needed to do anything. His last weight on 10/30/11 = 234 lbs = 106kg. 1.5mg /kg/day = 160mg  daily We discussed this should be fine. He shouldn't have any adverse effects from it.  He will continue with the Lovenox 170mg  daily on 12/18/11 (since that is what he is familiar giving himself). He will return for repeat INR on 12/20/11.

## 2011-12-18 ENCOUNTER — Emergency Department (HOSPITAL_COMMUNITY)
Admission: EM | Admit: 2011-12-18 | Discharge: 2011-12-18 | Disposition: A | Discharge status: Home / Self Care | Payer: Medicare Other | Attending: Emergency Medicine | Admitting: Emergency Medicine

## 2011-12-18 ENCOUNTER — Telehealth: Payer: Self-pay | Admitting: Pharmacist

## 2011-12-18 ENCOUNTER — Encounter (HOSPITAL_COMMUNITY): Payer: Self-pay | Admitting: *Deleted

## 2011-12-18 DIAGNOSIS — Z86718 Personal history of other venous thrombosis and embolism: Secondary | ICD-10-CM | POA: Insufficient documentation

## 2011-12-18 DIAGNOSIS — F17200 Nicotine dependence, unspecified, uncomplicated: Secondary | ICD-10-CM | POA: Insufficient documentation

## 2011-12-18 DIAGNOSIS — M79609 Pain in unspecified limb: Secondary | ICD-10-CM | POA: Insufficient documentation

## 2011-12-18 DIAGNOSIS — I10 Essential (primary) hypertension: Secondary | ICD-10-CM | POA: Insufficient documentation

## 2011-12-18 DIAGNOSIS — M7989 Other specified soft tissue disorders: Secondary | ICD-10-CM | POA: Insufficient documentation

## 2011-12-18 DIAGNOSIS — M109 Gout, unspecified: Secondary | ICD-10-CM | POA: Insufficient documentation

## 2011-12-18 DIAGNOSIS — M79604 Pain in right leg: Secondary | ICD-10-CM

## 2011-12-18 LAB — PROTIME-INR
INR: 1.36 (ref 0.00–1.49)
Prothrombin Time: 17 seconds — ABNORMAL HIGH (ref 11.6–15.2)

## 2011-12-18 LAB — BASIC METABOLIC PANEL
BUN: 8 mg/dL (ref 6–23)
CO2: 26 mEq/L (ref 19–32)
Calcium: 9.8 mg/dL (ref 8.4–10.5)
Chloride: 99 mEq/L (ref 96–112)
Creatinine, Ser: 0.85 mg/dL (ref 0.50–1.35)
GFR calc Af Amer: >90 mL/min (ref >90)
GFR calc non Af Amer: >90 mL/min (ref >90)
Glucose, Bld: 86 mg/dL (ref 70–99)
Potassium: 3.6 mEq/L (ref 3.5–5.1)
Sodium: 137 mEq/L (ref 135–145)

## 2011-12-18 LAB — URIC ACID: Uric Acid, Serum: 7.7 mg/dL (ref 4.0–7.8)

## 2011-12-18 MED ORDER — ONDANSETRON HCL 4 MG/2ML IJ SOLN
4.0000 mg | Freq: Once | INTRAMUSCULAR | Status: AC
  Administered 2011-12-18: 4 mg via INTRAVENOUS
  Filled 2011-12-18: qty 2

## 2011-12-18 MED ORDER — HYDROMORPHONE HCL 2 MG PO TABS: ORAL_TABLET | ORAL | Status: DC

## 2011-12-18 MED ORDER — HYDROMORPHONE HCL PF 1 MG/ML IJ SOLN
1.0000 mg | Freq: Once | INTRAMUSCULAR | Status: AC
  Administered 2011-12-18: 1 mg via INTRAVENOUS
  Filled 2011-12-18: qty 1

## 2011-12-18 MED ORDER — MORPHINE SULFATE 4 MG/ML IJ SOLN
4.0000 mg | Freq: Once | INTRAMUSCULAR | Status: AC
  Administered 2011-12-18: 4 mg via INTRAVENOUS
  Filled 2011-12-18: qty 1

## 2011-12-18 NOTE — ED Notes (Signed)
Pt sts Hx of multiple dvts. Had pain that began suddenly this am in R calf. Sts he can't even rest arms on upper legs. Has filters placed in chest and abd. On coumadin and lovenox shots.

## 2011-12-18 NOTE — ED Notes (Signed)
[place], EDP at bedside.

## 2011-12-18 NOTE — ED Provider Notes (Signed)
History     CSN: [id number]  Arrival date & time 12/18/11  1702   First MD Initiated Contact with Patient 12/18/11 1842      Chief Complaint  Patient presents with  . DVT    (Consider location/radiation/quality/duration/timing/severity/associated sxs/prior treatment) The history is provided by the patient and medical records.   the patient is a 61 year old male, with a history of multiple DVTs in the past, who presents to emergency department after seeing his physician, for right leg pain and swelling.  His doctor told him to come to the emergency department for an ultrasound for possible recurrent DVT.  The patient used to be on Coumadin, but has stopped it because he is to have a thoracic injection.  He continues to be on Lovenox.  Patient denies chest pain, or shortness of breath.  He has not had fevers, chills or, to his leg.  She has not been bedridden  Past Medical History  Diagnosis Date  . Hypertension   . History of blood clots   . Diverticulitis   . Hernia   . Diverticulosis   . Shingles   . Gout     Past Surgical History  Procedure Date  . Back surgery   . Hernia repair   . Hiatal hernia repair     Family History  Problem Relation Age of Onset  . Prostate cancer Father   . Diabetes Mother   . Colon cancer Maternal Uncle   . Pancreatic cancer Paternal Uncle     History  Substance Use Topics  . Smoking status: Current Everyday Smoker  . Smokeless tobacco: Not on file  . Alcohol Use: No      Review of Systems  Constitutional: Negative for fever and chills.  Respiratory: Negative for cough and shortness of breath.   Cardiovascular: Positive for leg swelling. Negative for chest pain and palpitations.  Gastrointestinal: Negative for nausea and vomiting.  Skin: Negative for rash.  Neurological: Negative for dizziness and light-headedness.  Hematological: Does not bruise/bleed easily.  All other systems reviewed and are negative.    Allergies    Aspirin; Celecoxib; Ibuprofen; and Lyrica  Home Medications   Current Outpatient Rx  Name Route Sig Dispense Refill  . ACYCLOVIR 400 MG PO TABS Oral Take 400 mg by mouth daily.    Marland Kitchen BENAZEPRIL HCL 40 MG PO TABS Oral Take 80 mg by mouth Daily.    . CYMBALTA 60 MG PO CPEP Oral Take 120 mg by mouth daily.     Marland Kitchen DEXAMETHASONE 0.5 MG PO TABS Oral Take 0.5 mg by mouth 2 (two) times daily with a meal.    . DIAZEPAM 10 MG PO TABS Oral Take 1 tablet by mouth At bedtime as needed. ANXIETY    . DOCUSATE SODIUM 100 MG PO CAPS Oral Take 100 mg by mouth daily as needed. CONSTIPATION    . ENOXAPARIN SODIUM 100 MG/ML [place] SOLN Subcutaneous Inject 100 mg into the skin daily.    Marland Kitchen ENOXAPARIN SODIUM 80 MG/0.8ML [place] SOLN Subcutaneous Inject 70 mg into the skin daily.    . FEBUXOSTAT 40 MG PO TABS Oral Take 80 mg by mouth daily.    Marland Kitchen HYDROCHLOROTHIAZIDE 25 MG PO TABS Oral Take 25 mg by mouth daily.    Marland Kitchen METHOCARBAMOL 750 MG PO TABS Oral Take 750 mg by mouth 3 (three) times daily as needed. MUSCLE SPASM    . METOPROLOL TARTRATE 50 MG PO TABS Oral Take 100 mg by mouth Daily.    Marland Kitchen  NIFEDICAL XL 60 MG PO TB24 Oral Take 120 mg by mouth daily.     Marland Kitchen VITAMIN D (ERGOCALCIFEROL) 50000 UNITS PO CAPS Oral Take 50,000 Units by mouth every 7 (seven) days. Every sunday    . VOLTAREN 1 % TD GEL Topical Apply 1 application topically 4 (four) times daily as needed. Applied to neck for neck pain.    . WARFARIN SODIUM 5 MG PO TABS Oral Take 10 mg by mouth daily.      BP 152/94  Pulse 78  Temp(Src) 97.9 F (36.6 C) (Oral)  Resp 20  SpO2 100%  Physical Exam  Vitals reviewed. Constitutional: He is oriented to person, place, and time. He appears well-developed and well-nourished. No distress.  HENT:  Head: Normocephalic and atraumatic.  Eyes: Conjunctivae and EOM are normal.  Neck: Normal range of motion.  Cardiovascular: Normal rate.   No murmur heard. Pulmonary/Chest: Effort normal and breath sounds normal.  Abdominal:  Soft. Bowel sounds are normal.  Musculoskeletal: Normal range of motion. He exhibits no edema.       The patient has tender varicosities on the medial aspect of his right calf.  There is no swelling of the calf erythema or temperature change  Neurological: He is alert and oriented to person, place, and time.  Skin: Skin is warm and dry. No rash noted. No erythema.  Psychiatric: He has a normal mood and affect. Thought content normal.    ED Course  Procedures (including critical care time) 61 year old male, with a reported history of multiple DVTs presents to emergency department complaining of right leg pain.  His physician, was concerned that he has a recurrent DVT.  The patient is presently on Lovenox.  His Coumadin was stopped because he didn't have a thoracic procedure.  Patient denies any chest pain or shortness of breath.  We will perform laboratory testing, and an ultrasound of his leg.  There is no indication for a CT of his chest.  At this time   Labs Reviewed  BASIC METABOLIC PANEL  PROTIME-INR  URIC ACID   No results found.   No diagnosis found.  9:34 PM vasc tech not available.  Will get doppler tomorrow.  Pain improved.  Explained this to the patient and family, they understand and agree with the plan Patient is already taking Lovenox and has restarted his Coumadin  MDM  Right calf pain is tender varicosities in the setting of a history of DVTs        Cheri Guppy, MD 12/18/11 2135

## 2011-12-18 NOTE — Discharge Instructions (Signed)
Use Dilaudid for severe pain.  Followup tomorrow at the vascular lab for an ultrasound of your leg, to look for a DVT.  Continue your Coumadin, and Lovenox.  The hospital will contact you to tell you what time to come to the vascular lab for the Doppler.  Call the vascular lab at [facility] if you have not heard from them by noon time tomorrow

## 2011-12-18 NOTE — Telephone Encounter (Signed)
Pt called stating that he thinks he has another clot in his leg.  His leg is warm and hurts to touch.  I instructed pt to go to ED.

## 2011-12-19 ENCOUNTER — Ambulatory Visit (HOSPITAL_COMMUNITY)
Admission: RE | Admit: 2011-12-19 | Discharge: 2011-12-19 | Disposition: A | Discharge status: Home / Self Care | Payer: Medicare Other | Source: Ambulatory Visit | Attending: Internal Medicine | Admitting: Internal Medicine

## 2011-12-19 DIAGNOSIS — I82409 Acute embolism and thrombosis of unspecified deep veins of unspecified lower extremity: Secondary | ICD-10-CM

## 2011-12-19 DIAGNOSIS — M7989 Other specified soft tissue disorders: Secondary | ICD-10-CM

## 2011-12-19 DIAGNOSIS — Z7901 Long term (current) use of anticoagulants: Secondary | ICD-10-CM | POA: Insufficient documentation

## 2011-12-19 DIAGNOSIS — M79609 Pain in unspecified limb: Secondary | ICD-10-CM

## 2011-12-19 NOTE — Progress Notes (Addendum)
VASCULAR LAB PRELIMINARY  PRELIMINARY  PRELIMINARY  PRELIMINARY  Right lower extremity venous duplex has been completed.    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 (This was noted on prior exams in2011 and 2010)  No acute DVT noted.  Patient has appointment in Coumadin clinic 12/20/11.    Vanna Scotland,  RVT 12/19/2011, 2:02 PM

## 2011-12-20 ENCOUNTER — Ambulatory Visit (HOSPITAL_BASED_OUTPATIENT_CLINIC_OR_DEPARTMENT_OTHER): Payer: Medicare Other | Admitting: Pharmacist

## 2011-12-20 ENCOUNTER — Other Ambulatory Visit: Payer: Medicare Other | Admitting: Lab

## 2011-12-20 DIAGNOSIS — I2699 Other pulmonary embolism without acute cor pulmonale: Secondary | ICD-10-CM

## 2011-12-20 DIAGNOSIS — I82409 Acute embolism and thrombosis of unspecified deep veins of unspecified lower extremity: Secondary | ICD-10-CM

## 2011-12-20 LAB — PROTIME-INR
INR: 2.5 (ref 2.00–3.50)
Protime: 30 Seconds — ABNORMAL HIGH (ref 10.6–13.4)

## 2011-12-20 LAB — POCT INR: INR: 2.5

## 2011-12-20 NOTE — Progress Notes (Signed)
Pt went to ED with pain in lower leg.  U/S negative for new DVT on 12/19/11.  Today pt stated he feels light headed and has SOB.  Pt saw his PCP, Dr. Alcide Goodness, yesterday with these same symptoms.  Pt will call Dr. Alcide Goodness back today since these symptoms are continuing.  He refused to be seen by provider here.  Pt stated that he is tired of feeling bad.  He denies wanting to hurt himself or suicidal thoughts.   INR increased 1.2 to 2.5 in 4 days on Coumadin 10mg  daily.  This dose is lower than what pt had been maintained on prior, which was 12.5mg  daily.  Will stop Lovenox and continue Coumadin 10mg  daily.  Will check PT/INR in 1 week to ensure INR is still at goal.

## 2011-12-26 ENCOUNTER — Telehealth: Payer: Self-pay | Admitting: Pharmacist

## 2011-12-26 ENCOUNTER — Other Ambulatory Visit: Payer: Medicare Other | Admitting: Lab

## 2011-12-26 ENCOUNTER — Ambulatory Visit: Payer: Medicare Other

## 2011-12-26 NOTE — Telephone Encounter (Signed)
Called and left vm for pt to reschedule his missed PT/INR and Coumadin Clinic appt today

## 2012-01-03 ENCOUNTER — Ambulatory Visit: Payer: Medicare Other

## 2012-01-03 ENCOUNTER — Other Ambulatory Visit: Payer: Medicare Other | Admitting: Lab

## 2012-01-06 ENCOUNTER — Ambulatory Visit: Payer: Medicare Other

## 2012-01-07 ENCOUNTER — Ambulatory Visit (HOSPITAL_BASED_OUTPATIENT_CLINIC_OR_DEPARTMENT_OTHER): Payer: Medicare Other | Admitting: Pharmacist

## 2012-01-07 ENCOUNTER — Other Ambulatory Visit (HOSPITAL_BASED_OUTPATIENT_CLINIC_OR_DEPARTMENT_OTHER): Payer: Medicare Other

## 2012-01-07 DIAGNOSIS — I2699 Other pulmonary embolism without acute cor pulmonale: Secondary | ICD-10-CM

## 2012-01-07 DIAGNOSIS — I82409 Acute embolism and thrombosis of unspecified deep veins of unspecified lower extremity: Secondary | ICD-10-CM

## 2012-01-07 LAB — PROTIME-INR
INR: 4.8 — ABNORMAL HIGH (ref 2.00–3.50)
Protime: 57.6 Seconds — ABNORMAL HIGH (ref 10.6–13.4)

## 2012-01-07 NOTE — Progress Notes (Signed)
INR supratherapeutic today (4.8).  No missed doses.  No extra doses.  No complaints of bleeding or bruising.  Only medication change is that his pain medication has been switched from oxycodone/APAP to oxycodone 15mg  PO QID PRN.  He states that he takes between 2-4 doses per day.  This should not pose a significant drug interaction with his Coumadin.  Will have pt hold x 1 dose, then resume Coumadin at a decreased dose of 10mg  daily except 7.5mg  on MWF.  Will recheck INR in 1 week.

## 2012-01-14 ENCOUNTER — Ambulatory Visit (HOSPITAL_BASED_OUTPATIENT_CLINIC_OR_DEPARTMENT_OTHER): Payer: Medicare Other | Admitting: Pharmacist

## 2012-01-14 ENCOUNTER — Other Ambulatory Visit: Payer: Medicare Other | Admitting: Lab

## 2012-01-14 DIAGNOSIS — I2699 Other pulmonary embolism without acute cor pulmonale: Secondary | ICD-10-CM

## 2012-01-14 DIAGNOSIS — I82409 Acute embolism and thrombosis of unspecified deep veins of unspecified lower extremity: Secondary | ICD-10-CM

## 2012-01-14 LAB — POCT INR: INR: 4.5

## 2012-01-14 LAB — PROTIME-INR
INR: 4.5 — ABNORMAL HIGH (ref 2.00–3.50)
Protime: 54 Seconds — ABNORMAL HIGH (ref 10.6–13.4)

## 2012-01-14 NOTE — Patient Instructions (Addendum)
Hold coumadin today and tomorrow.  Resume with 7.5 mg on Thu and Fri with 10 mg on Sat and Sun.  We will recheck his INR on Mon at 2pm

## 2012-01-14 NOTE — Progress Notes (Signed)
Hold coumadin today and tomorrow.  Resume with 7.5 mg on Thu and Fri with 10 mg on Sat and Sun.  We will recheck his INR on Mon at 2pm 

## 2012-01-20 ENCOUNTER — Ambulatory Visit (HOSPITAL_BASED_OUTPATIENT_CLINIC_OR_DEPARTMENT_OTHER): Payer: Medicare Other | Admitting: Pharmacist

## 2012-01-20 ENCOUNTER — Other Ambulatory Visit (HOSPITAL_BASED_OUTPATIENT_CLINIC_OR_DEPARTMENT_OTHER): Payer: Medicare Other | Admitting: Lab

## 2012-01-20 DIAGNOSIS — I2699 Other pulmonary embolism without acute cor pulmonale: Secondary | ICD-10-CM

## 2012-01-20 DIAGNOSIS — I82409 Acute embolism and thrombosis of unspecified deep veins of unspecified lower extremity: Secondary | ICD-10-CM

## 2012-01-20 LAB — POCT INR: INR: 2.7

## 2012-01-20 LAB — PROTIME-INR
INR: 2.7 (ref 2.00–3.50)
Protime: 32.4 Seconds — ABNORMAL HIGH (ref 10.6–13.4)

## 2012-01-20 NOTE — Progress Notes (Signed)
INR = 2.7  Pt followed our instructions from last visit.  He received 42.5 mg in past week. Pt states R leg is swollen.  This is not new for him. I will change his dose to 7.5 mg/day; 5 mg MWF (= 45 mg/week) Return in 1 week.

## 2012-01-28 ENCOUNTER — Other Ambulatory Visit: Payer: Medicare Other | Admitting: Lab

## 2012-01-28 ENCOUNTER — Ambulatory Visit: Payer: Medicare Other

## 2012-01-30 ENCOUNTER — Other Ambulatory Visit (HOSPITAL_BASED_OUTPATIENT_CLINIC_OR_DEPARTMENT_OTHER): Payer: Medicare Other | Admitting: Lab

## 2012-01-30 ENCOUNTER — Ambulatory Visit (HOSPITAL_BASED_OUTPATIENT_CLINIC_OR_DEPARTMENT_OTHER): Payer: Medicare Other | Admitting: Pharmacist

## 2012-01-30 DIAGNOSIS — I2699 Other pulmonary embolism without acute cor pulmonale: Secondary | ICD-10-CM

## 2012-01-30 DIAGNOSIS — I82409 Acute embolism and thrombosis of unspecified deep veins of unspecified lower extremity: Secondary | ICD-10-CM

## 2012-01-30 LAB — PROTIME-INR
INR: 1.6 — ABNORMAL LOW (ref 2.00–3.50)
Protime: 19.2 Seconds — ABNORMAL HIGH (ref 10.6–13.4)

## 2012-01-30 LAB — POCT INR: INR: 1.6

## 2012-01-30 NOTE — Progress Notes (Signed)
Pt currently taking lower Coumadin doses than in the past.  Pt states he missed a "dose or so" of Coumadin.  With low INR and despite possibly missing some doses, will increase to 7.5mg  daily.  Will check PT/INR in 1 week.  Pt has appt with neurologist on 02/07/12.

## 2012-02-06 ENCOUNTER — Other Ambulatory Visit (HOSPITAL_BASED_OUTPATIENT_CLINIC_OR_DEPARTMENT_OTHER): Payer: Medicare Other | Admitting: Lab

## 2012-02-06 ENCOUNTER — Ambulatory Visit: Payer: Medicare Other | Admitting: Pharmacist

## 2012-02-06 DIAGNOSIS — I2699 Other pulmonary embolism without acute cor pulmonale: Secondary | ICD-10-CM

## 2012-02-06 DIAGNOSIS — I82409 Acute embolism and thrombosis of unspecified deep veins of unspecified lower extremity: Secondary | ICD-10-CM

## 2012-02-06 LAB — PROTIME-INR
INR: 2.5 (ref 2.00–3.50)
Protime: 30 Seconds — ABNORMAL HIGH (ref 10.6–13.4)

## 2012-02-06 NOTE — Progress Notes (Signed)
INR therapeutic today (2.5) after increase in dose to 7.5 mg daily.  No problems with bleeding or bruising. No recent med changes.  Will continue current dose and recheck INR in 1 week to ensure that pt stays in therapeutic range.

## 2012-02-14 ENCOUNTER — Other Ambulatory Visit: Payer: Medicare Other | Admitting: Lab

## 2012-02-14 ENCOUNTER — Ambulatory Visit: Payer: Medicare Other

## 2012-02-18 ENCOUNTER — Ambulatory Visit: Payer: Medicare Other | Admitting: Pharmacist

## 2012-02-18 ENCOUNTER — Other Ambulatory Visit (HOSPITAL_BASED_OUTPATIENT_CLINIC_OR_DEPARTMENT_OTHER): Payer: Medicare Other

## 2012-02-18 DIAGNOSIS — I2699 Other pulmonary embolism without acute cor pulmonale: Secondary | ICD-10-CM

## 2012-02-18 DIAGNOSIS — I82409 Acute embolism and thrombosis of unspecified deep veins of unspecified lower extremity: Secondary | ICD-10-CM

## 2012-02-18 LAB — PROTIME-INR
INR: 3.3 (ref 2.00–3.50)
Protime: 39.6 Seconds — ABNORMAL HIGH (ref 10.6–13.4)

## 2012-02-18 LAB — POCT INR: INR: 3.3

## 2012-02-18 NOTE — Progress Notes (Signed)
Pt started Zithromax a few days ago for sinus infection. This could contribute to a slightly higher than normal INR today of 3.3.  Pt states he has not missed any doses and his appetite is improving. He request to RTC in one week vs 10 days.

## 2012-02-18 NOTE — Patient Instructions (Signed)
Pt started Zithromax a few days ago for sinus infection. This could contribute to a slightly higher than normal INR today of 3.3.  Pt states he has not missed any doses and his appetite is improving. He request to RTC in one week vs 10 days.  We will recheck him on Tue 6/18.

## 2012-02-25 ENCOUNTER — Ambulatory Visit: Payer: Medicare Other

## 2012-02-25 ENCOUNTER — Other Ambulatory Visit: Payer: Medicare Other

## 2012-02-25 ENCOUNTER — Telehealth: Payer: Self-pay | Admitting: Pharmacist

## 2012-02-25 NOTE — Telephone Encounter (Signed)
Left message. Asked patient to call us back and reschedule lab/clinic appointment from 02/25/12.

## 2012-02-28 ENCOUNTER — Telehealth: Payer: Self-pay | Admitting: Pharmacist

## 2012-03-02 ENCOUNTER — Emergency Department (HOSPITAL_COMMUNITY): Payer: Medicare Other

## 2012-03-02 ENCOUNTER — Emergency Department (HOSPITAL_COMMUNITY)
Admission: EM | Admit: 2012-03-02 | Discharge: 2012-03-03 | Disposition: A | Discharge status: Home / Self Care | Payer: Medicare Other | Attending: Emergency Medicine | Admitting: Emergency Medicine

## 2012-03-02 ENCOUNTER — Encounter (HOSPITAL_COMMUNITY): Payer: Self-pay | Admitting: *Deleted

## 2012-03-02 ENCOUNTER — Ambulatory Visit: Payer: Medicare Other

## 2012-03-02 ENCOUNTER — Other Ambulatory Visit: Payer: Medicare Other | Admitting: Lab

## 2012-03-02 DIAGNOSIS — M2550 Pain in unspecified joint: Secondary | ICD-10-CM | POA: Insufficient documentation

## 2012-03-02 DIAGNOSIS — R791 Abnormal coagulation profile: Secondary | ICD-10-CM

## 2012-03-02 DIAGNOSIS — I10 Essential (primary) hypertension: Secondary | ICD-10-CM | POA: Insufficient documentation

## 2012-03-02 DIAGNOSIS — Z79899 Other long term (current) drug therapy: Secondary | ICD-10-CM | POA: Insufficient documentation

## 2012-03-02 DIAGNOSIS — R609 Edema, unspecified: Secondary | ICD-10-CM | POA: Insufficient documentation

## 2012-03-02 DIAGNOSIS — R079 Chest pain, unspecified: Secondary | ICD-10-CM

## 2012-03-02 DIAGNOSIS — R45851 Suicidal ideations: Secondary | ICD-10-CM | POA: Insufficient documentation

## 2012-03-02 DIAGNOSIS — R062 Wheezing: Secondary | ICD-10-CM | POA: Insufficient documentation

## 2012-03-02 DIAGNOSIS — M109 Gout, unspecified: Secondary | ICD-10-CM | POA: Insufficient documentation

## 2012-03-02 LAB — DIFFERENTIAL
Basophils Absolute: 0 10*3/uL (ref 0.0–0.1)
Basophils Relative: 0 % (ref 0–1)
Eosinophils Absolute: 0 10*3/uL (ref 0.0–0.7)
Eosinophils Relative: 1 % (ref 0–5)
Lymphocytes Relative: 43 % (ref 12–46)
Lymphs Abs: 1.9 10*3/uL (ref 0.7–4.0)
Monocytes Absolute: 0.4 10*3/uL (ref 0.1–1.0)
Monocytes Relative: 8 % (ref 3–12)
Neutro Abs: 2.2 10*3/uL (ref 1.7–7.7)
Neutrophils Relative %: 48 % (ref 43–77)

## 2012-03-02 LAB — CBC
HCT: 40.9 % (ref 39.0–52.0)
Hemoglobin: 13.9 g/dL (ref 13.0–17.0)
MCH: 23.8 pg — ABNORMAL LOW (ref 26.0–34.0)
MCHC: 34 g/dL (ref 30.0–36.0)
MCV: 69.9 fL — ABNORMAL LOW (ref 78.0–100.0)
Platelets: 189 10*3/uL (ref 150–400)
RBC: 5.85 MIL/uL — ABNORMAL HIGH (ref 4.22–5.81)
RDW: 14.7 % (ref 11.5–15.5)
WBC: 4.5 10*3/uL (ref 4.0–10.5)

## 2012-03-02 LAB — COMPREHENSIVE METABOLIC PANEL
ALT: 14 U/L (ref 0–53)
AST: 16 U/L (ref 0–37)
Albumin: 4.2 g/dL (ref 3.5–5.2)
Alkaline Phosphatase: 49 U/L (ref 39–117)
BUN: 8 mg/dL (ref 6–23)
CO2: 25 mEq/L (ref 19–32)
Calcium: 10 mg/dL (ref 8.4–10.5)
Chloride: 98 mEq/L (ref 96–112)
Creatinine, Ser: 0.93 mg/dL (ref 0.50–1.35)
GFR calc Af Amer: >90 mL/min (ref >90)
GFR calc non Af Amer: 89 mL/min — ABNORMAL LOW (ref >90)
Glucose, Bld: 95 mg/dL (ref 70–99)
Potassium: 3.3 mEq/L — ABNORMAL LOW (ref 3.5–5.1)
Sodium: 137 mEq/L (ref 135–145)
Total Bilirubin: 0.4 mg/dL (ref 0.3–1.2)
Total Protein: 7 g/dL (ref 6.0–8.3)

## 2012-03-02 LAB — PROTIME-INR
INR: 1.63 — ABNORMAL HIGH (ref 0.00–1.49)
Prothrombin Time: 19.6 seconds — ABNORMAL HIGH (ref 11.6–15.2)

## 2012-03-02 LAB — LIPASE, BLOOD: Lipase: 20 U/L (ref 11–59)

## 2012-03-02 LAB — ETHANOL: Alcohol, Ethyl (B): <11 mg/dL (ref 0–11)

## 2012-03-02 LAB — RAPID URINE DRUG SCREEN, HOSP PERFORMED
Amphetamines: NOT DETECTED
Barbiturates: NOT DETECTED
Benzodiazepines: POSITIVE — AB
Cocaine: NOT DETECTED
Opiates: POSITIVE — AB
Tetrahydrocannabinol: NOT DETECTED

## 2012-03-02 LAB — TROPONIN I: Troponin I: <0.3 ng/mL (ref <0.30)

## 2012-03-02 MED ORDER — METHOCARBAMOL 750 MG PO TABS
750.0000 mg | ORAL_TABLET | Freq: Three times a day (TID) | ORAL | Status: DC | PRN
  Administered 2012-03-02: 750 mg via ORAL
  Filled 2012-03-02: qty 1

## 2012-03-02 MED ORDER — WARFARIN SODIUM 2.5 MG PO TABS
12.5000 mg | ORAL_TABLET | Freq: Once | ORAL | Status: AC
  Administered 2012-03-02: 12.5 mg via ORAL
  Filled 2012-03-02: qty 1

## 2012-03-02 MED ORDER — DULOXETINE HCL 60 MG PO CPEP
60.0000 mg | ORAL_CAPSULE | Freq: Two times a day (BID) | ORAL | Status: DC
  Administered 2012-03-02: 60 mg via ORAL
  Filled 2012-03-02 (×2): qty 1

## 2012-03-02 MED ORDER — WARFARIN - PHARMACIST DOSING INPATIENT: Freq: Every day | Status: DC

## 2012-03-02 MED ORDER — ZOLPIDEM TARTRATE 5 MG PO TABS
5.0000 mg | ORAL_TABLET | Freq: Every evening | ORAL | Status: DC | PRN
  Administered 2012-03-03: 5 mg via ORAL
  Filled 2012-03-02: qty 1

## 2012-03-02 MED ORDER — LORAZEPAM 1 MG PO TABS
1.0000 mg | ORAL_TABLET | Freq: Three times a day (TID) | ORAL | Status: DC | PRN
  Filled 2012-03-02: qty 1

## 2012-03-02 MED ORDER — ONDANSETRON HCL 8 MG PO TABS: 4.0000 mg | ORAL_TABLET | Freq: Three times a day (TID) | ORAL | Status: DC | PRN

## 2012-03-02 MED ORDER — ALUM & MAG HYDROXIDE-SIMETH 200-200-20 MG/5ML PO SUSP: 30.0000 mL | ORAL | Status: DC | PRN

## 2012-03-02 MED ORDER — METOPROLOL TARTRATE 25 MG PO TABS
100.0000 mg | ORAL_TABLET | Freq: Every day | ORAL | Status: DC
  Administered 2012-03-02: 100 mg via ORAL
  Filled 2012-03-02: qty 4

## 2012-03-02 MED ORDER — RISPERIDONE 2 MG PO TABS
2.0000 mg | ORAL_TABLET | Freq: Every day | ORAL | Status: DC
  Administered 2012-03-02: 2 mg via ORAL
  Filled 2012-03-02 (×2): qty 1

## 2012-03-02 MED ORDER — IOHEXOL 350 MG/ML SOLN: 100.0000 mL | Freq: Once | INTRAVENOUS | Status: AC | PRN

## 2012-03-02 MED ORDER — IPRATROPIUM BROMIDE 0.02 % IN SOLN
0.5000 mg | RESPIRATORY_TRACT | Status: DC
  Administered 2012-03-02 – 2012-03-03 (×2): 0.5 mg via RESPIRATORY_TRACT
  Filled 2012-03-02 (×2): qty 2.5

## 2012-03-02 MED ORDER — PREDNISONE 20 MG PO TABS
60.0000 mg | ORAL_TABLET | Freq: Once | ORAL | Status: AC
  Administered 2012-03-02: 60 mg via ORAL
  Filled 2012-03-02: qty 3

## 2012-03-02 MED ORDER — ALBUTEROL SULFATE (5 MG/ML) 0.5% IN NEBU
2.5000 mg | INHALATION_SOLUTION | RESPIRATORY_TRACT | Status: DC
  Administered 2012-03-02 – 2012-03-03 (×2): 2.5 mg via RESPIRATORY_TRACT
  Filled 2012-03-02 (×2): qty 0.5

## 2012-03-02 MED ORDER — ACETAMINOPHEN 325 MG PO TABS
650.0000 mg | ORAL_TABLET | ORAL | Status: DC | PRN
  Filled 2012-03-02: qty 2

## 2012-03-02 MED ORDER — HYDROMORPHONE HCL PF 1 MG/ML IJ SOLN
1.0000 mg | Freq: Once | INTRAMUSCULAR | Status: AC
  Administered 2012-03-02: 1 mg via INTRAVENOUS
  Filled 2012-03-02: qty 1

## 2012-03-02 MED ORDER — MORPHINE SULFATE 4 MG/ML IJ SOLN
4.0000 mg | Freq: Once | INTRAMUSCULAR | Status: AC
  Administered 2012-03-02: 4 mg via INTRAVENOUS
  Filled 2012-03-02: qty 1

## 2012-03-02 MED ORDER — NICOTINE 21 MG/24HR TD PT24
21.0000 mg | MEDICATED_PATCH | Freq: Every day | TRANSDERMAL | Status: DC
  Administered 2012-03-02: 21 mg via TRANSDERMAL
  Filled 2012-03-02: qty 1

## 2012-03-02 MED ORDER — NIFEDIPINE ER 60 MG PO TB24
120.0000 mg | ORAL_TABLET | Freq: Every day | ORAL | Status: DC
  Administered 2012-03-02: 120 mg via ORAL
  Filled 2012-03-02 (×2): qty 2

## 2012-03-02 MED ORDER — BENAZEPRIL HCL 40 MG PO TABS
80.0000 mg | ORAL_TABLET | Freq: Every day | ORAL | Status: DC
  Administered 2012-03-02: 80 mg via ORAL
  Filled 2012-03-02 (×2): qty 2

## 2012-03-02 NOTE — ED Notes (Signed)
Patient transported to CT 

## 2012-03-02 NOTE — ED Notes (Signed)
To ED for eval of 'depression and a lot of pain'. Pt is under the care of Dr Mikeal Hawthorne and giving him pain medication. Pt states he was also told he was also told to see a neurologist. Pt states if he could get the pain under control he can handle the depression. Pt has implanted nerve stimulator. Denies SI/HI. Pt is calm and cooperative. Good eye contact.

## 2012-03-02 NOTE — ED Notes (Signed)
Pt. Spoke at length concerning his "body being severely depressed and feeling like every year it deteriorates."  Pt. Has a quivering voice and is tearful. Pt reports that it "is hard for him to go up the stairs and when he finally makes it up stairs and sees his wife and nothing in his house he goes off sometimes."  Pt. Reports that since he" lost his job he has wanted to check out of hear.  But I don;t have a plan yet."  Pt.'s wife reports that "he keeps talking about going away from here."  Pt. Began to cry when talking about "his father and daughter dying right behind each other." Pt. reports "that it is just too much right after another after another."  Pt. Noted to curse at times and voice to get louder.  Pt. Then says "I am sorry. Not trying to be disrespectful."

## 2012-03-02 NOTE — ED Notes (Signed)
Meds ordered from pharmacy.

## 2012-03-02 NOTE — ED Provider Notes (Signed)
History   This chart was scribed for Forbes Cellar, MD by Toya Smothers. The patient was seen in room TR08C/TR08C. Patient's care was started at 1524.  CSN: [id number]  Arrival date & time 03/02/12  1524   First MD Initiated Contact with Patient 03/02/12 1716      Chief Complaint  Patient presents with  . Joint Pain  . Suicidal   HPI  [name] [name] is a 61 y.o. male who presents to the Emergency Department complaining of gradual onset moderate severe constant depression onset years ago, worsening 2 weeks ago. Pt states that he has violent and aggressive ideations and suicidal ideations when frustrated. Pt reports that he lost his daughter new years eve and that is the last time that he had suicidal ideations. No plan. Last thoughts were last night although Pt denies attempt to commit suicide. Pt also c/o joint pain in both hips and knees worsening 1 week ago listing a chronic h/o gout. Pt also c/o SOB which he believes may be related to chest pain with onset two weeks ago. Also has h/o wheezing in past. States it feels like his anxiety. Pt is a current everyday smoker.  Denies ethanol, illicit drugs. No HI but states sometimes he wants to hurt people. Denies AVH  ED Notes, ED Provider Notes from 03/02/12 0000 to 03/02/12 17:08:35       Melissa Rolan Bucco, RN 03/02/2012 16:11      To ED for eval of 'depression and a lot of pain'. Pt is under the care of Dr Mikeal Hawthorne and giving him pain medication. Pt states he was also told he was also told to see a neurologist. Pt states if he could get the pain under control he can handle the depression. Pt has implanted nerve stimulator. Denies SI/HI. Pt is calm and cooperative. Good eye contact.     Past Medical History  Diagnosis Date  . Hypertension   . History of blood clots   . Diverticulitis   . Hernia   . Diverticulosis   . Shingles   . Gout     Past Surgical History  Procedure Date  . Back surgery   . Hernia repair   . Hiatal hernia repair       Family History  Problem Relation Age of Onset  . Prostate cancer Father   . Diabetes Mother   . Colon cancer Maternal Uncle   . Pancreatic cancer Paternal Uncle     History  Substance Use Topics  . Smoking status: Current Everyday Smoker  . Smokeless tobacco: Not on file  . Alcohol Use: No    Review of Systems  Constitutional: Positive for appetite change.  Respiratory: Positive for shortness of breath and wheezing.   Cardiovascular: Positive for chest pain.  Musculoskeletal: Positive for arthralgias.  Psychiatric/Behavioral: Positive for suicidal ideas.       Depression.  All other systems reviewed and are negative.   Allergies  Aspirin; Celecoxib; Ibuprofen; and Lyrica  Home Medications   Current Outpatient Rx  Name Route Sig Dispense Refill  . BENAZEPRIL HCL 40 MG PO TABS Oral Take 80 mg by mouth Daily.    . CYMBALTA 60 MG PO CPEP Oral Take 60 mg by mouth 2 (two) times daily.     Marland Kitchen DIAZEPAM 10 MG PO TABS Oral Take 1 tablet by mouth At bedtime as needed. ANXIETY    . METHOCARBAMOL 750 MG PO TABS Oral Take 750 mg by mouth 3 (three) times daily as needed.  MUSCLE SPASM    . METOPROLOL TARTRATE 50 MG PO TABS Oral Take 100 mg by mouth Daily.    Marland Kitchen NIFEDICAL XL 60 MG PO TB24 Oral Take 120 mg by mouth daily.     . OXYCODONE-ACETAMINOPHEN 10-325 MG PO TABS Oral Take 1 tablet by mouth every 8 (eight) hours as needed. For pain    . VITAMIN D (ERGOCALCIFEROL) 50000 UNITS PO CAPS Oral Take 50,000 Units by mouth every 7 (seven) days. Every sunday    . VOLTAREN 1 % TD GEL Topical Apply 1 application topically 4 (four) times daily as needed. Applied to neck for neck pain.    . WARFARIN SODIUM 5 MG PO TABS Oral Take 12.5 mg by mouth daily.       BP 136/95  Pulse 84  Temp 98.9 F (37.2 C) (Oral)  Resp 16  SpO2 100%  Physical Exam  Nursing note and vitals reviewed. Constitutional: He is oriented to person, place, and time. He appears well-developed and well-nourished. No  distress.  HENT:  Head: Normocephalic and atraumatic.  Eyes: EOM are normal. Pupils are equal, round, and reactive to light.  Neck: Neck supple. No tracheal deviation present.  Cardiovascular: Normal rate, regular rhythm and normal heart sounds.   Pulmonary/Chest: Effort normal. No respiratory distress. He has wheezes (diffusely.).       No conversational dyspnea.   Abdominal: Soft. He exhibits no distension.  Musculoskeletal: Normal range of motion. He exhibits no edema.       1+ pitting edema +1 bilaterally. Pain in both hips with ROM b/l LE.  Diffuse knee tenderness pain with ROM. Diffuse hip tenderness, pain with ROM. Diffuse b/l ankle pain. Diffuse ttp all toes.  No calf ttp  Neurological: He is alert and oriented to person, place, and time. No sensory deficit.  Skin: Skin is warm and dry.  Psychiatric: He has a normal mood and affect. His behavior is normal.    Date: 03/02/2012  Rate: 73  Rhythm: normal sinus rhythm  QRS Axis: normal  Intervals: normal  ST/T Wave abnormalities: normal  Conduction Disutrbances:incomplete RBBB  Narrative Interpretation:   Old EKG Reviewed: no significant change from previous   ED Course  Procedures (including critical care time) DIAGNOSTIC STUDIES: Oxygen Saturation is 100% on room air, normal by my interpretation.      Labs Reviewed  CBC - Abnormal; Notable for the following:    RBC 5.85 (*)     MCV 69.9 (*)     MCH 23.8 (*)     All other components within normal limits  COMPREHENSIVE METABOLIC PANEL - Abnormal; Notable for the following:    Potassium 3.3 (*)     GFR calc non Af Amer 89 (*)     All other components within normal limits  PROTIME-INR - Abnormal; Notable for the following:    Prothrombin Time 19.6 (*)     INR 1.63 (*)     All other components within normal limits  URINE RAPID DRUG SCREEN (HOSP PERFORMED) - Abnormal; Notable for the following:    Opiates POSITIVE (*)     Benzodiazepines POSITIVE (*)     All other  components within normal limits  DIFFERENTIAL  LIPASE, BLOOD  TROPONIN I  ETHANOL   Dg Chest 2 View  03/02/2012  *RADIOLOGY REPORT*  Clinical Data: 61 year old male with chest pain and tightness. Joint pain.  CHEST - 2 VIEW  Comparison: 11/28/2011 and earlier.  Findings: Stable thoracic spinal stimulator leads.  Lung  volumes are normal.  Stable mediastinal contours with mild tortuosity of the thoracic aorta. Visualized tracheal air column is within normal limits.  The lungs are clear.  No pneumothorax or effusion. No acute osseous abnormality identified.  IMPRESSION: No acute cardiopulmonary abnormality.  Original Report Authenticated By: Harley Hallmark, M.D.    1. Suicidal ideation   2. Joint pain   3. Subtherapeutic international normalized ratio (INR)   4. Chest pain   5. Wheezing    MDM  Multiple problems 1. Worsening depression and suicidal ideation- ACT to see, needs telepsych 2.  Chest pain-- Pt states feels like his anxiety but h/o DVT and subtherapeutic INR. Wheezing resolved after duoneb treatment and feeling better. Troponin negative and pain has been constant x one week, unlikely ACS. EKG nondiagnostic. Doubt dissection. CTA pending. 3. B/L LE pain. Chronic and joints only. H/o Chronic DVT as well, doubt  Acute DVT.   Pt signed out to Ivonne Andrew, [place]. CTA chest pending for medical clearance. Pharmacy to dose coumadin.   I personally performed the services described in this documentation, which was scribed in my presence. The recorded information has been reviewed and considered.     Forbes Cellar, MD 03/02/12 2059

## 2012-03-02 NOTE — Progress Notes (Signed)
ANTICOAGULATION CONSULT NOTE - Initial Consult  Pharmacy Consult for coumadin Indication:  History of chronic VTE  Allergies  Allergen Reactions  . Aspirin Other (See Comments)    ulcers  . Celecoxib Other (See Comments)    Stomach irritation  . Ibuprofen Other (See Comments)    ulcers  . Lyrica (Pregabalin) Swelling   Vital Signs: Temp: 98.9 F (37.2 C) (06/24 1612) Temp src: Oral (06/24 1612) BP: 136/95 mmHg (06/24 1612) Pulse Rate: 84  (06/24 1612)  Labs:  Basename 03/02/12 1827 03/02/12 1824  HGB -- 13.9  HCT -- 40.9  PLT -- 189  APTT -- --  LABPROT -- 19.6*  INR -- 1.63*  HEPARINUNFRC -- --  CREATININE -- 0.93  CKTOTAL -- --  CKMB -- --  TROPONINI <0.30 --    The CrCl is unknown because both a height and weight (above a minimum accepted value) are required for this calculation.   Medical History: Past Medical History  Diagnosis Date  . Hypertension   . History of blood clots   . Diverticulitis   . Hernia   . Diverticulosis   . Shingles   . Gout      Assessment: 61 yo male here with depression and joint pain noted on coumadin (7.5mg /day at home last dose 03/01/12) and current INR=1.63.    Goal of Therapy:  INR 2-3 Monitor platelets by anticoagulation protocol: Yes   Plan:  -Coumadin 12.5mg  today  -PT/INR daily  Thank you for asking pharmacy to be involved in the care of this patient.    Harland German, Pharm D 03/02/2012 9:09 PM

## 2012-03-03 MED ORDER — TRAZODONE HCL 150 MG PO TABS: 150.0000 mg | ORAL_TABLET | Freq: Every day | ORAL | Status: DC

## 2012-03-03 MED ORDER — IOHEXOL 350 MG/ML SOLN
100.0000 mL | Freq: Once | INTRAVENOUS | Status: AC | PRN
  Administered 2012-03-03: 100 mL via INTRAVENOUS

## 2012-03-03 MED ORDER — ARIPIPRAZOLE 5 MG PO TABS: 5.0000 mg | ORAL_TABLET | Freq: Every day | ORAL | Status: DC

## 2012-03-03 MED ORDER — ARIPIPRAZOLE 5 MG PO TABS
5.0000 mg | ORAL_TABLET | Freq: Once | ORAL | Status: AC
  Administered 2012-03-03: 5 mg via ORAL
  Filled 2012-03-03 (×2): qty 1

## 2012-03-03 NOTE — ED Provider Notes (Signed)
[name] [name]  Patient discussed in sign out with Dr. Hyman Hopes. Patient with subtherapeutic Coumadin and chest pain for 1 week. Patient with negative troponin unremarkable EKG. Patient awaiting CT angiogram rule out PE. Patient has normal respirations and good O2 sats be useful low probability clinically. Patient also presents with complaints of suicidal ideations. Tele-psych pending.   Dr. Reece Leader with psychiatry has consult on the patient. Patient is felt safe to discharge home. Patient should continue current dosage of Cymbalta. Dr. Reece Leader does recommend adding a pillow 5 mg by mouth at night as well as trazodone 150 mg at night. Patient will followup with outpatient psychiatry.   Patient has been advised to followup with Dr. Eben Burow this week for recheck of INR.    [name], [place] 03/03/12 [phone]

## 2012-03-03 NOTE — Discharge Instructions (Signed)
You were seen and evaluated for your complaints of pains, chest pain and increased depression. Your lab testing and CAT scans today have not shown any concerning or emergent causes to your pains your symptoms. Your testing does show that your INR level for your Coumadin is low. You'll need to schedule a close followup appointment with your doctor to have a recheck of your levels this week. Please take 1 extra dose of your medication tomorrow. You were also seen and evaluated by the psychiatry specialist and have recommendations for 2 new medications to help with your mood. Please take these at night as prescribed and followup with your primary care provider or psychiatrist. Return to emergency room for any worsening symptoms.    Depression, Adolescent and Adult Depression is a true and treatable medical condition. In general there are two kinds of depression:  Depression we all experience in some form. For example depression from the death of a loved one, financial distress or natural disasters will trigger or increase depression.   Clinical depression, on the other hand, appears without an apparent cause or reason. This depression is a disease. Depression may be caused by chemical imbalance in the body and brain or may come as a response to a physical illness. Alcohol and other drugs can cause depression.  DIAGNOSIS  The diagnosis of depression is usually based upon symptoms and medical history. TREATMENT  Treatments for depression fall into three categories. These are:  Drug therapy. There are many medicines that treat depression. Responses may vary and sometimes trial and error is necessary to determine the best medicines and dosage for a particular patient.   Psychotherapy, also called talking treatments, helps people resolve their problems by looking at them from a different point of view and by giving people insight into their own personal makeup. Traditional psychotherapy looks at a childhood  source of a problem. Other psychotherapy will look at current conflicts and move toward solving those. If the cause of depression is drug use, counseling is available to help abstain. In time the depression will usually improve. If there were underlying causes for the chemical use, they can be addressed.   ECT (electroconvulsive therapy) or shock treatment is not as commonly used today. It is a very effective treatment for severe suicidal depression. During ECT electrical impulses are applied to the head. These impulses cause a generalized seizure. It can be effective but causes a loss of memory for recent events. Sometimes this loss of memory may include the last several months.  Treat all depression or suicide threats as serious. Obtain professional help. Do not wait to see if serious depression will get better over time without help. Seek help for yourself or those around you. In the [place] the number to the National Suicide Help Lines With 24 Hour Help Are: 1-800-SUICIDE 803-060-6851 Document Released: 08/23/2000 Document Revised: 08/15/2011 Document Reviewed: 04/13/2008 [facility] Patient Information 2012 [place], [place].    Chronic Pain Chronic pain can be defined as pain that is lasting, off and on, and lasts for 3 to 6 months or longer. Many things cause chronic pain, which can make it difficult to make a discrete diagnosis. There are many treatment options available for chronic pain. However, finding a treatment that works well for you may require trying various approaches until a suitable one is found. CAUSES  In some types of chronic medical conditions, the pain is caused by a normal pain response within the body. A normal pain response helps the body identify  illness or injury and prevent further damage from being done. In these cases, the cause of the pain may be identified and treated, even if it may not be cured completely. Examples of chronic conditions which can cause chronic pain  include:  Inflammation of the joints (arthritis).   Back pain or neck pain (including bulging or herniated disks).   Migraine headaches.   Cancer.  In some other types of chronic pain syndromes, the pain is caused by an abnormal pain response within the body. An abnormal pain response is present when there is no ongoing cause (or stimulus) for the pain, or when the cause of the pain is arising from the nerves or nervous system itself. Examples of conditions which can cause chronic pain due to an abnormal pain response include:  Fibromyalgia.   Reflex sympathetic dystrophy (RSD).   Neuropathy (when the nerves themselves are damaged, and may cause pain).  DIAGNOSIS  Your caregiver will help diagnose your condition over time. In many cases, the initial focus will be on excluding conditions that could be causing the pain. Depending on your symptoms, your caregiver may order some tests to diagnose your condition. Some of these tests include:  Blood tests.   Computerized X-ray scans (CT scan).   Computerized magnetic scans (MRI).   X-rays.   Ultrasounds.   Nerve conduction studies.   Consultation with other physicians or specialists.  TREATMENT  There are many treatment options for people suffering from chronic pain. Finding a treatment that works well may take time.   You may be referred to a pain management specialist.   You may be put on medication to help with the pain. Unfortunately, some medications (such as opiate medications) may not be very effective in cases where chronic pain is due to abnormal pain responses. Finding the right medications can take some time.   Adjunctive therapies may be used to provide additional relief and improve a patient's quality of life. These therapies include:   Mindfulness meditation.   Acupuncture.   Biofeedback.   Cognitive-behavioral therapy.   In certain cases, surgical interventions may be attempted.  HOME CARE INSTRUCTIONS    Make sure you understand these instructions prior to discharge.   Ask any questions and share any further concerns you have with your caregiver prior to discharge.   Take all medications as directed by your caregiver.   Keep all follow-up appointments.  SEEK MEDICAL CARE IF:   Your pain gets worse.   You develop a new pain that was not present before.   You cannot tolerate any medications prescribed by your caregiver.   You develop new symptoms since your last visit with your caregiver.  SEEK IMMEDIATE MEDICAL CARE IF:   You develop muscular weakness.   You have decreased sensation or numbness.   You lose control of bowel or bladder function.   Your pain suddenly gets much worse.   You have an oral temperature above 102 F (38.9 C), not controlled by medication.   You develop shaking chills, confusion, chest pain, or shortness of breath.  Document Released: 05/18/2002 Document Revised: 08/15/2011 Document Reviewed: 08/24/2008 [facility] Patient Information 2012 [place], [place].     RESOURCE GUIDE  Chronic Pain Problems: Contact [facility]  [phone] Patients need to be referred by their primary care doctor.  Insufficient Money for Medicine: Contact United Way:  call "211" or [facility] 804-453-8767.  No Primary Care Doctor: - Call Health Connect  [phone] - can  help you locate a primary care doctor that  accepts your insurance, provides certain services, etc. - Physician Referral Service- [phone]  Agencies that provide inexpensive medical care: - [facility]  [phone] - [facility]  [phone] - [facility]  [phone] - Women's Clinic  [phone] - Planned Parenthood  [phone] [facility]  [phone]  Medicaid-accepting [facility] Providers: - [facility]- [address]  [phone], Mon-Fri 9am-7pm, Sat 9am-1pm - [facility]- [address] [place], [address] [place]  [phone] - [facility]- [address], [address] [place]  [phone] [facility]- [address]  [phone] - Renaye Rakers- [address] [employer], [address], [phone]  Only accepts [place] patients after they have their name  applied to their card  Self Pay (no insurance) in [place]: - Sickle Cell Patients: Dr Willey Blade, [facility]  [address] [place], [phone] - [facility]- [address] [place]  [phone]       [facility] [place]- [address]       -     [facility]- see information above (Speak to [employer] if you do not have insurance)       -  [facility]- [address] [place], [phone]       -  [facility]- [address],  [phone]       -  [facility]- [address] [phone]       -  Dr Julio Sicks-  [address], [place], [phone]       -  [facility]- [address] [phone]       -  [facility]- [address], [phone], also [address] [phone]       -    [facility]- [address] [place], [phone], 1st & 3rd Saturday   every month, 10am-1pm  1) Find a Doctor and Pay Out of Pocket Although you won't have to find out who is covered by your insurance plan, it is a good idea to ask around and get recommendations. You will then need to call the office and see if the doctor you have chosen will accept you as a new patient and what types of options they offer for patients who are self-pay. Some doctors offer discounts or will set up payment plans for their patients who do not have insurance, but you will need to ask so you aren't surprised when you get to your appointment.  2) Contact Your Local Health Department Not all health departments have doctors that can see patients for sick visits, but many do, so  it is worth a call to see if yours does. If you don't know where your local health department is, you can check in your phone book. The CDC also has a tool to help you locate your state's health department, and many state websites also have listings of all of their local health departments.  3) Find a Walk-in Clinic If your illness is not likely to be very severe or complicated, you may want to try a walk in clinic. These are popping up all over the country in pharmacies, drugstores, and shopping centers. They're usually staffed  by nurse practitioners or physician assistants that have been trained to treat common illnesses and complaints. They're usually fairly quick and inexpensive. However, if you have serious medical issues or chronic medical problems, these are probably not your best option  STD Testing - [facility] Department of [facility] [place], STD Clinic, [address], [place], phone [phone] or [phone].  Monday - Friday, call for an appointment. [facility] Department of [employer], STD Clinic, [address], [place], phone [phone] or [phone].  Monday - Friday, call for an appointment.  Abuse/Neglect: [facility] Child Abuse Hotline 905-485-1206 [facility] Child Abuse Hotline 707-367-5595 (After Hours)  Emergency Shelter:  [facility] (401) 614-3381  Maternity Homes: - Room at the [place] [facility] [phone] - [facility] ([phone]  MRSA Hotline #:   (917) 524-1810  [facility] Resources  [facility] [place]  United Way [facility] Dept. [address]                 [address]         [address] [place] [address]  Blondell Reveal Phone:  [phone]                                  Phone:  [phone]                   Phone:  [phone]  [facility]  Mental Health, [phone] - [phone]       -     [facility] in [place], [address] [place] [address]                                  [phone], [facility] Child Abuse Hotline 314-577-7602 or 364-208-6425 (After Hours)   Behavioral Health Services  Substance Abuse Resources: - [facility] and Drug Services  203-656-4111 - [facility] [phone] - [facility] [place] [phone] Floydene Flock [phone] - Residential & Outpatient Substance Abuse Program  3346119888  Psychological Services: [facility]  ([phone] Services  ([phone] - [facility], [phone] [place]. [address], [place], ACCESS LINE: 8168695076 or (515) [phone], [employer]za  Dental Assistance  If unable to pay or uninsured, contact:  [facility] or [facility]. to become qualified for the adult dental clinic.  Patients with Medicaid: [facility] [phone] [address], [phone] [address], [phone]  If unable to pay, or uninsured, contact [facility] [phone]) or [facility] Department [phone] in [place], [phone] in [facility]) to become qualified for the adult dental clinic  Other Low-Cost Community Dental Services: - [facility]- [address] [place], [place], [place], [address], [phone], [phone], 2nd and 4th Thursday of the  month at 6:30am.  10 clients each day by appointment, can sometimes see walk-in patients if someone does not show for an appointment. [facility] [place] [facility]- [address] Ether Griffins [place], [place], [address], [phone] - [facility]- [address], [place], [place], [address], [phone] - [place]- [phone] [facility] Health Department- [phone] [facility] Department-  [phone]

## 2012-03-03 NOTE — BH Assessment (Addendum)
Assessment Note   [name] [name] is an 61 y.o. male.  [name] was brought to [facility] because of thoughts of SI.  He reports that he did have thoughts last evening but no plan.  He currently denies any SI, intent or plan.  [name] does say that last night he dreamed about seeing his deceased daughter in her casket and it made him want to harm self at that time.  He reports that one of his daughters died on 10-04-11 and that his father died in Jun 02, 2023 of 2023/02/13.  He has had a series of financial problems including a IRS audit, and disability taken away.  [name] however says that family is important to him.  Regarding suicide he says, "that is a coward's way out" and "I could not do that to my family" and "I love my life to much."  [name] does say that he is in a lot of pain due to chronic back pain, gout, arthritis.  He does deny HI but admits to yelling at wife when he is frustrated.  He also denies A/V hallucinations.  [name] does admit to having inpatient psychiatric care at [facility] back in 1993 and receiving ECT there.  [name] has had some outpatient care in the last year but cannot remember from whom.  He is able to contract for safety.  [name] is interested in seeing a psychiatrist to get help with his depression.  He does say that if he could get his chronic pain under control he would be much happier.  He had a nerve stimulator put in by a physician at [facility] about 2 years ago but reports that it does not help much.  Dr. Hyman Hopes [facility] had put in an order for a telepsych to assist with medication suggestions.  Ivonne Andrew, PA signed telepsych consult since Dr. Hyman Hopes signed patient to him.  [name] did say he could follow up with psychiatric outpatient referrals but it would not be until Friday (06/28).  Telepsychiatrist, Dr. Sela Hua, recommended discharge and to continue cymbalta.  He did also recommend Abilify 5mg  po now and qhs and start trazadone 150mg  po qhs.  [name] was  given referral information and signed a no-harm contract. 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 Hypertension   . History of blood clots   . Diverticulitis   . Hernia   . Diverticulosis   . Shingles   . Gout    Axis IV: economic problems, occupational problems and other psychosocial or environmental problems Axis V: 41-50 serious symptoms  Past Medical History:  Past Medical History  Diagnosis Date  . Hypertension   . History of blood clots   . Diverticulitis   . Hernia   . Diverticulosis   . Shingles   . Gout     Past Surgical History  Procedure Date  . Back surgery   . Hernia repair   . Hiatal hernia repair     Family History:  Family History  Problem Relation Age of Onset  . Prostate cancer Father   . Diabetes Mother   . Colon cancer Maternal Uncle   . Pancreatic cancer Paternal Uncle     Social History:  reports that he has been smoking.  He does not have any smokeless tobacco history on file. He reports that he does not drink alcohol or use illicit drugs.  Additional Social History:  Alcohol / Drug Use Pain Medications: See medication reconcilliation Prescriptions: See medication reconcilliation Over the Counter: Med rec History of alcohol / drug use?: Yes Substance #1 Name of Substance 1: Marijuana 1 - Age of First Use: unknown 1 - Amount (size/oz): unknown 1 - Frequency: Used for short period over 6 months ago to relieve pain but it did not work. 1 - Duration: Few months 1 - Last Use / Amount: Six months ago  CIWA: CIWA-Ar BP: 115/77 mmHg Pulse Rate: 64  COWS:    Allergies:  Allergies  Allergen Reactions  . Aspirin Other (See Comments)    ulcers  . Celecoxib Other (See Comments)    Stomach irritation  . Ibuprofen Other (See Comments)    ulcers  . Lyrica (Pregabalin) Swelling    Home Medications:  (Not in a hospital admission)  OB/GYN Status:   No LMP for male patient.  General Assessment Data Location of Assessment: [facility] ED Living Arrangements: Spouse/significant other Can pt return to current living arrangement?: Yes Admission Status: Voluntary Is patient capable of signing voluntary admission?: Yes Transfer from: Acute Hospital Referral Source: Self/Family/Friend     Risk to self Suicidal Ideation: No (Currently denies but admits to some thoughts last night) Suicidal Intent: No Is patient at risk for suicide?: No (Pt currently states he can contract for safety.) Suicidal Plan?: No Access to Means: No What has been your use of drugs/alcohol within the last 12 months?: None Previous Attempts/Gestures: No How many times?: 0  Other Self Harm Risks: N/A Triggers for Past Attempts: None known Intentional Self Injurious Behavior: None Family Suicide History: No Recent stressful life event(s): Loss (Comment);Job Loss;Financial Problems (Father died 05/01/2023 and a daughter died 2011/09/19) Persecutory voices/beliefs?: No Depression: Yes Depression Symptoms: Despondent;Insomnia;Fatigue;Loss of interest in usual pleasures;Feeling worthless/self pity Substance abuse history and/or treatment for substance abuse?: Yes (Some THC use but for short time.  Over 6 months ago) Suicide prevention information given to non-admitted patients: Not applicable  Risk to Others Homicidal Ideation: No Thoughts of Harm to Others: No Current Homicidal Intent: No Current Homicidal Plan: No Access to Homicidal Means: No Identified Victim: No one History of harm to others?: No Assessment of Violence: None Noted Violent Behavior Description: Pt calm and cooperative Does patient have access to weapons?: Yes (Comment) (Pt does report guns in the home) Criminal Charges Pending?: No Does patient have a court date: No  Psychosis Hallucinations: None noted Delusions: None noted  Mental Status Report Appear/Hygiene:  (Casual) Eye Contact: Good Motor  Activity: Unremarkable Speech: Logical/coherent Level of Consciousness: Alert Mood: Depressed;Sad (Also talkative) Affect: Depressed Anxiety Level: Panic Attacks Panic attack frequency: 3x/W Most recent panic attack: 02/29/12 Thought Processes: Coherent;Relevant Judgement: Unimpaired Orientation: Person;Place;Time;Situation Obsessive Compulsive Thoughts/Behaviors: None  Cognitive Functioning Concentration: Decreased Memory: Recent Impaired;Remote Intact IQ: Average Insight: Good Impulse Control: Good Appetite: Poor Weight Loss:  (Unknown) Weight Gain: 0  Sleep: Decreased Total Hours of Sleep:  (<6H/D) Vegetative Symptoms: None  ADLScreening [facility] Assessment Services) Patient's cognitive ability adequate to safely complete daily activities?: Yes Patient able to express need for assistance with ADLs?: Yes Independently performs ADLs?: Yes  Abuse/Neglect [facility] Physical Abuse: Denies Verbal Abuse: Denies Sexual Abuse: Denies  Prior Inpatient Therapy Prior Inpatient Therapy: Yes Prior Therapy Dates: 1993 Prior Therapy Facilty[facility]Provider([facility]): [facility] Reason for Treatment: Depression, received ECT  Prior Outpatient Therapy Prior Outpatient Therapy: Yes Prior Therapy Dates: Last year Prior Therapy Facilty/Provider(s): Cannot recall names Reason for Treatment: Depression  ADL Screening (condition at time of admission) Patient's cognitive ability adequate to safely complete daily activities?: Yes Patient able to express need for assistance with ADLs?: Yes Independently performs ADLs?: Yes Weakness of Legs: Right (reports chronic pain in right leg and back) Weakness of Arms/Hands: None  Home Assistive Devices/Equipment Home Assistive Devices/Equipment: None    Abuse/Neglect Assessment (Assessment to be complete while patient is alone) Physical Abuse: Denies Verbal Abuse: Denies Sexual Abuse: Denies Exploitation of patient/patient's resources:  Denies Self-Neglect: Denies Values / Beliefs Cultural Requests During Hospitalization: None Spiritual Requests During Hospitalization: None   Advance Directives (For Healthcare) Advance Directive: Patient has advance directive, copy not in chart Type of Advance Directive: Living will (Reports that it is "somewhere at home.")    Additional Information 1:1 In Past 12 Months?: No CIRT Risk: No Elopement Risk: No Does patient have medical clearance?: Yes     Disposition:  Disposition Disposition of Patient: Outpatient treatment;Referred to Type of outpatient treatment: Adult Patient referred to: [facility];Outpatient clinic referral [facility] consult )  On Site Evaluation by:   Reviewed with Physician:  Ivonne Andrew, PA   Beatriz Stallion Ray 03/03/2012 12:03 AM

## 2012-03-05 ENCOUNTER — Telehealth: Payer: Self-pay | Admitting: *Deleted

## 2012-03-05 NOTE — Telephone Encounter (Signed)
Spoke to pt and explained to him that since he is not our pt we can't prescribe him narcotics. I advised that if he wanted to become a pt of ours he will have to get whoever is prescribing his narcs now to refer him to [place]. He agreed and will get this taken care of. Pt is wanting to get off his narcs.

## 2012-03-05 NOTE — Telephone Encounter (Signed)
Pt of Dr. Vicente Males and he is out of town. Was given our number from the ER and told to contact us. In extreme pain. Please call.

## 2012-03-06 ENCOUNTER — Emergency Department (HOSPITAL_COMMUNITY)
Admission: EM | Admit: 2012-03-06 | Discharge: 2012-03-06 | Discharge status: Against Medical Advice | Payer: Medicare Other | Attending: Emergency Medicine | Admitting: Emergency Medicine

## 2012-03-06 ENCOUNTER — Encounter (HOSPITAL_COMMUNITY): Payer: Self-pay | Admitting: Family Medicine

## 2012-03-06 DIAGNOSIS — M549 Dorsalgia, unspecified: Secondary | ICD-10-CM | POA: Insufficient documentation

## 2012-03-06 HISTORY — DX: Dorsalgia, unspecified: M54.9

## 2012-03-06 NOTE — ED Notes (Signed)
Patient called 3 times for minor care room. Received no answer and moved to the next patient's.

## 2012-03-06 NOTE — ED Notes (Signed)
Pt reports having chronic lower back pain radiating into both legs. States that he has a nerve stimulator that worked well for about a year and a half and over the past couple months pain has increased. Reports difficulty walking.

## 2012-03-08 ENCOUNTER — Telehealth: Payer: Self-pay | Admitting: Oncology

## 2012-03-08 NOTE — Telephone Encounter (Signed)
On call:  patient called due to BRB on stool this am, without difficult BM. No other bleeding. Is on coumadin 7.5 mg daily. Last INR 03-02-12 in ED was 1.6, when he was seen for chest pain and suicidal ideation. CT angio chest negative for PE. He was begun on Abilify. He has some bruising at site of IV from that ED visit. He is scheduled for coumadin clinic at [facility] tomorrow (03-09-12).  History is of DVT/PE prior to 2010 (?) Patient will hold coumadin today, call if other bleeding before check at coumadin clinic tomorrow.  Jama Flavors, MD

## 2012-03-09 ENCOUNTER — Other Ambulatory Visit: Payer: Medicare Other | Admitting: Lab

## 2012-03-09 ENCOUNTER — Ambulatory Visit: Payer: Medicare Other | Admitting: Pharmacist

## 2012-03-09 DIAGNOSIS — I2699 Other pulmonary embolism without acute cor pulmonale: Secondary | ICD-10-CM

## 2012-03-09 DIAGNOSIS — I82409 Acute embolism and thrombosis of unspecified deep veins of unspecified lower extremity: Secondary | ICD-10-CM

## 2012-03-09 LAB — PROTIME-INR
INR: 2.9 (ref 2.00–3.50)
Protime: 34.8 Seconds — ABNORMAL HIGH (ref 10.6–13.4)

## 2012-03-09 LAB — POCT INR: INR: 2.9

## 2012-03-09 NOTE — Progress Notes (Signed)
Pt reports today with a therapuetic INR at 2.9 on what he has dosed at alternating doses of 7.5mg /10mg  daily.  He called MD on call yesterday to report bright red blood in his stool.  He was instructed to not take coumadin last night and continue with coum clinic appmt today.  He "felt he needed to take his coumadin by the way he felt" so he did not miss the dose.  He attributed his BRB in his stools to his motrin use over the last week for the pain in his knees.  We reviewed his medication list.  No changes other than starting Abilify in ER on 03/02/12.  He reports it makes him feel weird, but he understands it will take time for his body to adjust.  He is planning to FU with his PCP upon his (PCP) return from vacation.  He is having anxiety attacks and gets easily angered.  He is also awaiting a call from a pain clinic.  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  The last 18 months have been beyond hard financially and emotionally.  We will see him back in one week.  He is instructed to call us if he has anymore episodes of bleeding between now and his next appmt.

## 2012-03-09 NOTE — Patient Instructions (Signed)
Continue to take the coumadin 7.5 mg and 10 mg alternating days.  We will recheck his INR in one week.

## 2012-03-10 ENCOUNTER — Other Ambulatory Visit: Payer: Self-pay | Admitting: Pharmacist

## 2012-03-10 ENCOUNTER — Other Ambulatory Visit: Payer: Self-pay | Admitting: *Deleted

## 2012-03-10 DIAGNOSIS — I82409 Acute embolism and thrombosis of unspecified deep veins of unspecified lower extremity: Secondary | ICD-10-CM

## 2012-03-10 DIAGNOSIS — I2699 Other pulmonary embolism without acute cor pulmonale: Secondary | ICD-10-CM

## 2012-03-10 MED ORDER — WARFARIN SODIUM 5 MG PO TABS: ORAL_TABLET | ORAL | Status: DC

## 2012-03-11 ENCOUNTER — Emergency Department (HOSPITAL_COMMUNITY)
Admission: EM | Admit: 2012-03-11 | Discharge: 2012-03-11 | Disposition: A | Discharge status: Home / Self Care | Payer: Medicare Other | Attending: Emergency Medicine | Admitting: Emergency Medicine

## 2012-03-11 ENCOUNTER — Encounter (HOSPITAL_COMMUNITY): Payer: Self-pay | Admitting: *Deleted

## 2012-03-11 DIAGNOSIS — M549 Dorsalgia, unspecified: Secondary | ICD-10-CM

## 2012-03-11 DIAGNOSIS — I10 Essential (primary) hypertension: Secondary | ICD-10-CM | POA: Insufficient documentation

## 2012-03-11 DIAGNOSIS — F17200 Nicotine dependence, unspecified, uncomplicated: Secondary | ICD-10-CM | POA: Insufficient documentation

## 2012-03-11 DIAGNOSIS — Z886 Allergy status to analgesic agent status: Secondary | ICD-10-CM | POA: Insufficient documentation

## 2012-03-11 DIAGNOSIS — M109 Gout, unspecified: Secondary | ICD-10-CM | POA: Insufficient documentation

## 2012-03-11 DIAGNOSIS — Z8042 Family history of malignant neoplasm of prostate: Secondary | ICD-10-CM | POA: Insufficient documentation

## 2012-03-11 DIAGNOSIS — M79609 Pain in unspecified limb: Secondary | ICD-10-CM | POA: Insufficient documentation

## 2012-03-11 DIAGNOSIS — Z800 Family history of malignant neoplasm of digestive organs: Secondary | ICD-10-CM | POA: Insufficient documentation

## 2012-03-11 DIAGNOSIS — Z833 Family history of diabetes mellitus: Secondary | ICD-10-CM | POA: Insufficient documentation

## 2012-03-11 MED ORDER — ACYCLOVIR 400 MG PO TABS: 400.0000 mg | ORAL_TABLET | Freq: Four times a day (QID) | ORAL | Status: AC

## 2012-03-11 MED ORDER — OXYCODONE-ACETAMINOPHEN 5-325 MG PO TABS
1.0000 | ORAL_TABLET | Freq: Once | ORAL | Status: AC
  Administered 2012-03-11: 1 via ORAL
  Filled 2012-03-11: qty 1

## 2012-03-11 MED ORDER — OXYCODONE-ACETAMINOPHEN 5-325 MG PO TABS: 1.0000 | ORAL_TABLET | Freq: Four times a day (QID) | ORAL | Status: DC | PRN

## 2012-03-11 NOTE — ED Provider Notes (Signed)
Medical screening examination/treatment/procedure(s) were performed by non-physician practitioner and as supervising physician I was immediately available for consultation/collaboration.  Kalany Diekmann, MD 03/11/12 0443 

## 2012-03-11 NOTE — ED Provider Notes (Signed)
History     CSN: [id number]  Arrival date & time 03/11/12  0008   First MD Initiated Contact with Patient 03/11/12 0207      Chief Complaint  Patient presents with  . lumps on arm and back     (Consider location/radiation/quality/duration/timing/severity/associated sxs/prior treatment) HPI  61 year old male with history of chronic pain, history of blood clots on warfarin, and history of shingles presents complaining of back pain and lump sensation on right upper arm. Patient states for the past 3-4 days he notice a burning sensation which started in his right upper back and radiates how to his right shoulder and arm. States sensation is excruciating, constant, worsening with palpation and even with cold air. Symptoms feels similar to prior shingle. Denies fever, sore throat, trouble breathing, cp, sob, n/v/d, rash or recent trauma.  Sts he has been under a lot of stress but denies HI/SI.  He has tried taking muscle relaxant, and Voltaren gel without any relief.  He cannot follow up with his doctor until next week.    Past Medical History  Diagnosis Date  . Hypertension   . History of blood clots   . Diverticulitis   . Hernia   . Diverticulosis   . Shingles   . Gout   . Back pain     Past Surgical History  Procedure Date  . Back surgery   . Hernia repair   . Hiatal hernia repair     Family History  Problem Relation Age of Onset  . Prostate cancer Father   . Diabetes Mother   . Colon cancer Maternal Uncle   . Pancreatic cancer Paternal Uncle     History  Substance Use Topics  . Smoking status: Current Everyday Smoker  . Smokeless tobacco: Not on file  . Alcohol Use: No      Review of Systems  All other systems reviewed and are negative.    Allergies  Aspirin; Celecoxib; Ibuprofen; and Lyrica  Home Medications   Current Outpatient Rx  Name Route Sig Dispense Refill  . ARIPIPRAZOLE 5 MG PO TABS Oral Take 1 tablet (5 mg total) by mouth daily. 10 tablet 0    . BENAZEPRIL HCL 40 MG PO TABS Oral Take 80 mg by mouth Daily.    . CYMBALTA 60 MG PO CPEP Oral Take 60 mg by mouth 2 (two) times daily.     Marland Kitchen DIAZEPAM 10 MG PO TABS Oral Take 1 tablet by mouth At bedtime as needed. ANXIETY    . METHOCARBAMOL 750 MG PO TABS Oral Take 750 mg by mouth 3 (three) times daily as needed. MUSCLE SPASM    . METOPROLOL TARTRATE 50 MG PO TABS Oral Take 100 mg by mouth Daily.    Marland Kitchen NIFEDICAL XL 60 MG PO TB24 Oral Take 120 mg by mouth daily.     . OXYCODONE HCL 15 MG PO TABS Oral Take 2 tablets by mouth every 6 (six) hours as needed. For pain    . VITAMIN D (ERGOCALCIFEROL) 50000 UNITS PO CAPS Oral Take 50,000 Units by mouth every 7 (seven) days. Every sunday    . VOLTAREN 1 % TD GEL Topical Apply 1 application topically 4 (four) times daily as needed. Applied to neck for neck pain.    . WARFARIN SODIUM 5 MG PO TABS Oral Take 7.5-10 mg by mouth daily. Alternating daily dose 7.5mg  one day, then 10mg  the next, repeat      BP 150/92  Pulse 86  Temp  98.3 F (36.8 C)  Resp 18  SpO2 99%  Physical Exam  Nursing note and vitals reviewed. Constitutional: He appears well-developed and well-nourished. No distress.  HENT:  Head: Normocephalic and atraumatic.  Mouth/Throat: No oropharyngeal exudate.  Eyes: Conjunctivae and EOM are normal. Pupils are equal, round, and reactive to light.  Neck: Neck supple.  Cardiovascular: Normal rate and regular rhythm.   Pulmonary/Chest: Effort normal and breath sounds normal. No respiratory distress. He has no wheezes.    Abdominal: Soft. There is no tenderness.  Musculoskeletal: Normal range of motion. He exhibits no edema.       Right shoulder: Normal.       Cervical back: Normal.       Thoracic back: Normal.       Lumbar back: Normal.       Right upper arm: He exhibits tenderness. He exhibits no bony tenderness, no swelling, no edema and no deformity.  Lymphadenopathy:    He has no cervical adenopathy.  Neurological: He is  alert.  Skin: Skin is warm. No rash noted.    ED Course  Procedures (including critical care time)  Labs Reviewed - No data to display No results found.   No diagnosis found.    MDM  Patient with prior history of shingle presents complaining of pain from his right upper back which radiates down to right arm. Skin is tender to palpation which may resemble a dermatomal pattern, however no overlying skin changes noted. No rash. No joint involvement. Low suspicion for cardiopulmonary disease right now. Low suspicion for PE. Patient is under a lot of stress and does have prior history of shingle. Therefore, I will preemptively treat with acyclovir, and pain medication. Patient agrees to follow up with his PCP for further care.       Fayrene Helper, PA-C 03/11/12 0232  Fayrene Helper, PA-C 03/11/12 [phone]

## 2012-03-11 NOTE — ED Notes (Signed)
He has lumps on his rt upper arm and his rt posterior chest pain.  No visible lesions on the skin surface.  He says the pain is so severe he cannot sleep

## 2012-03-17 ENCOUNTER — Other Ambulatory Visit (HOSPITAL_BASED_OUTPATIENT_CLINIC_OR_DEPARTMENT_OTHER): Payer: Medicare Other | Admitting: Lab

## 2012-03-17 ENCOUNTER — Ambulatory Visit (HOSPITAL_BASED_OUTPATIENT_CLINIC_OR_DEPARTMENT_OTHER): Payer: Medicare Other | Admitting: Pharmacist

## 2012-03-17 DIAGNOSIS — I82409 Acute embolism and thrombosis of unspecified deep veins of unspecified lower extremity: Secondary | ICD-10-CM

## 2012-03-17 DIAGNOSIS — I2699 Other pulmonary embolism without acute cor pulmonale: Secondary | ICD-10-CM

## 2012-03-17 LAB — PROTIME-INR
INR: 3.5 (ref 2.00–3.50)
Protime: 42 Seconds — ABNORMAL HIGH (ref 10.6–13.4)

## 2012-03-17 NOTE — Progress Notes (Signed)
INR supratherapeutic today (3.5).  Not clear why INR is elevated since he is not doing anything differently.  No problems with bleeding or bruising - the bright red blood that he saw in his stool 2 weeks ago resolved almost immediately.    Will hold Coumadin x 1 today, then resume 7.5mg  alt with 10mg  daily.  Recheck INR in 1 week.  If he is therapeutic in 1 week, may need to decrease weekly dose to 52.5 mg (his total dose over 6 days) or 7.5mg  daily.

## 2012-03-24 ENCOUNTER — Other Ambulatory Visit: Payer: Self-pay | Admitting: *Deleted

## 2012-03-24 ENCOUNTER — Other Ambulatory Visit (HOSPITAL_BASED_OUTPATIENT_CLINIC_OR_DEPARTMENT_OTHER): Payer: Medicare Other | Admitting: Lab

## 2012-03-24 ENCOUNTER — Ambulatory Visit (HOSPITAL_BASED_OUTPATIENT_CLINIC_OR_DEPARTMENT_OTHER): Payer: Medicare Other | Admitting: Pharmacist

## 2012-03-24 ENCOUNTER — Encounter: Payer: Self-pay | Admitting: Psychiatry

## 2012-03-24 DIAGNOSIS — I2699 Other pulmonary embolism without acute cor pulmonale: Secondary | ICD-10-CM

## 2012-03-24 DIAGNOSIS — I82409 Acute embolism and thrombosis of unspecified deep veins of unspecified lower extremity: Secondary | ICD-10-CM

## 2012-03-24 LAB — PROTIME-INR
INR: 2.9 (ref 2.00–3.50)
Protime: 34.8 Seconds — ABNORMAL HIGH (ref 10.6–13.4)

## 2012-03-24 LAB — POCT INR: INR: 2.9

## 2012-03-24 NOTE — Progress Notes (Signed)
Continue same dose. Continue alternating between 7.5 mg and 10 mg daily. Return on 04/09/12 for lab at 11 am and Coumadin Clinic at 11:15 am

## 2012-03-24 NOTE — Patient Instructions (Addendum)
Continue same dose. Continue alternating between 7.5 mg and 10 mg daily. Return on 04/09/12 for lab at 11 am and Coumadin Clinic at 11:15 am.  Dr. Noe Gens will be glad to see you. Her direct number is [phone]. She is available for any questions or to make an appointment.

## 2012-03-30 ENCOUNTER — Telehealth: Payer: Self-pay | Admitting: *Deleted

## 2012-03-30 NOTE — Telephone Encounter (Signed)
I spoke with [name] [name] about his referral to this office.  We had requested from Dr Maxwell Caul office the records from Dr Shon Baton office and we had never received them. [name] [name] would need to follow up with Dr Maxwell Caul office about this. I also informed him that if we did see him it would be in a non-narcotic capacity and he needed to decide if he still wanted to pursue seeing [place]. I reviewed what could be done as far as injections and he said that he has had all that before.  He is going to follow up with his PCP.

## 2012-03-30 NOTE — Telephone Encounter (Signed)
Spoke with pt regrading request.  He has been waiting for a month about a referral/appt and has not heard anything.  He is in a lot of pain and would like to start recovering.  Advised pt we would look into his case and get back to him.  He agreed.

## 2012-03-30 NOTE — Telephone Encounter (Signed)
Calling regarding paperwork from his PCP. He is supposed to be a new patient here, please call.

## 2012-03-31 ENCOUNTER — Telehealth: Payer: Self-pay | Admitting: *Deleted

## 2012-03-31 NOTE — Telephone Encounter (Signed)
patient confirmed over the phone the new date and time on 05-15-2012 at 11:30am

## 2012-04-09 ENCOUNTER — Ambulatory Visit: Payer: Medicare Other

## 2012-04-09 ENCOUNTER — Other Ambulatory Visit: Payer: Medicare Other | Admitting: Lab

## 2012-04-13 ENCOUNTER — Ambulatory Visit: Payer: Medicare Other

## 2012-04-13 ENCOUNTER — Other Ambulatory Visit: Payer: Medicare Other | Admitting: Lab

## 2012-04-14 ENCOUNTER — Ambulatory Visit: Payer: Medicare Other

## 2012-04-14 ENCOUNTER — Other Ambulatory Visit: Payer: Medicare Other | Admitting: Lab

## 2012-04-20 ENCOUNTER — Telehealth: Payer: Self-pay | Admitting: Pharmacist

## 2012-04-20 NOTE — Telephone Encounter (Signed)
Unable to retreive INR results from Dr. Maxwell Caul office from 04/15/12. I called patient and he confirmed the lab was not done. Pt rescheduled for 04/23/12; lab = 11am, Coumadin clinic = 11:15am.

## 2012-04-23 ENCOUNTER — Other Ambulatory Visit (HOSPITAL_BASED_OUTPATIENT_CLINIC_OR_DEPARTMENT_OTHER): Payer: Medicare Other | Admitting: Lab

## 2012-04-23 ENCOUNTER — Ambulatory Visit (HOSPITAL_BASED_OUTPATIENT_CLINIC_OR_DEPARTMENT_OTHER): Payer: Medicare Other | Admitting: Pharmacist

## 2012-04-23 DIAGNOSIS — I82409 Acute embolism and thrombosis of unspecified deep veins of unspecified lower extremity: Secondary | ICD-10-CM

## 2012-04-23 DIAGNOSIS — I2699 Other pulmonary embolism without acute cor pulmonale: Secondary | ICD-10-CM

## 2012-04-23 LAB — PROTIME-INR
INR: 4 — ABNORMAL HIGH (ref 2.00–3.50)
Protime: 48 Seconds — ABNORMAL HIGH (ref 10.6–13.4)

## 2012-04-23 NOTE — Progress Notes (Signed)
INR supratherapeutic today (4.0). Took 2 tablets of Bactrim DS over the last week in attempt to treat shingles.  Explained to patient that Bactrim is not meant to treat shingles, it treats infections when taken properly.  Instructed pt to dispose of any remaining tablets, as he is not suffering from a bacterial infection currently and this drug can increase his INR and risk of bleeding.  Pt communicated understanding.  No other changes.  Complains of increased ankle and knee pain related to swelling.  Encouraged use of voltaren gel since he reports that this improves his pain.    Will have pt hold 2 doses of Coumadin, then resume usual dose of 7.5mg  alt with 10mg  daily.  Recheck INR in 1 week to ensure that pt returns to therapeutic range.

## 2012-04-27 ENCOUNTER — Ambulatory Visit: Payer: Medicare Other | Admitting: Psychiatry

## 2012-04-30 ENCOUNTER — Other Ambulatory Visit: Payer: Medicare Other | Admitting: Lab

## 2012-04-30 ENCOUNTER — Ambulatory Visit: Payer: Medicare Other

## 2012-05-04 ENCOUNTER — Other Ambulatory Visit (HOSPITAL_BASED_OUTPATIENT_CLINIC_OR_DEPARTMENT_OTHER): Payer: Medicare Other | Admitting: Lab

## 2012-05-04 ENCOUNTER — Ambulatory Visit: Payer: Medicare Other | Admitting: Pharmacist

## 2012-05-04 DIAGNOSIS — I82409 Acute embolism and thrombosis of unspecified deep veins of unspecified lower extremity: Secondary | ICD-10-CM

## 2012-05-04 DIAGNOSIS — Z5181 Encounter for therapeutic drug level monitoring: Secondary | ICD-10-CM

## 2012-05-04 DIAGNOSIS — I2699 Other pulmonary embolism without acute cor pulmonale: Secondary | ICD-10-CM

## 2012-05-04 DIAGNOSIS — Z7901 Long term (current) use of anticoagulants: Secondary | ICD-10-CM

## 2012-05-04 LAB — PROTIME-INR
INR: 3 (ref 2.00–3.50)
Protime: 36 Seconds — ABNORMAL HIGH (ref 10.6–13.4)

## 2012-05-04 NOTE — Progress Notes (Signed)
INR therapeutic today (3.0) Patient changed his own dose to 7.5mg  daily ~7 days ago. He previously alternated between 7.5mg  and 10mg .  No problems with bleeding or bruising.  Has been eating less vegetables, especially leafy green vegetables lately. Counseled patient on the importance of sticking with his prescribed dose of Coumadin - that it helps [place] interpret his blood draws better.  If he notices that he is bruising more or something else seems odd to him, he should call us.  We would be more than happy to test his INR sooner than his scheduled appointment.  We can then make an educated decision about his dose of Coumadin rather than working blindly off of his feelings, which could be incorrect.  Pt communicated understanding, and asked that we share this discussion (and his new dose of Coumadin) with his wife. Moving forward, will have the patient start a consistent dose of 7.5mg  daily except 10mg  on Wednesday.  We will recheck his INR in ~ 10 days with his next lab/MD visit.

## 2012-05-15 ENCOUNTER — Ambulatory Visit: Payer: Medicare Other | Admitting: Pharmacist

## 2012-05-15 ENCOUNTER — Ambulatory Visit (HOSPITAL_BASED_OUTPATIENT_CLINIC_OR_DEPARTMENT_OTHER): Payer: Medicare Other | Admitting: Oncology

## 2012-05-15 ENCOUNTER — Other Ambulatory Visit (HOSPITAL_BASED_OUTPATIENT_CLINIC_OR_DEPARTMENT_OTHER): Payer: Medicare Other | Admitting: Lab

## 2012-05-15 ENCOUNTER — Encounter: Payer: Self-pay | Admitting: Oncology

## 2012-05-15 VITALS — BP 167/103 | HR 65 | Temp 97.7°F | Resp 18 | Ht 76.0 in | Wt 256.5 lb

## 2012-05-15 DIAGNOSIS — G629 Polyneuropathy, unspecified: Secondary | ICD-10-CM

## 2012-05-15 DIAGNOSIS — Z7901 Long term (current) use of anticoagulants: Secondary | ICD-10-CM

## 2012-05-15 DIAGNOSIS — I82409 Acute embolism and thrombosis of unspecified deep veins of unspecified lower extremity: Secondary | ICD-10-CM

## 2012-05-15 DIAGNOSIS — G609 Hereditary and idiopathic neuropathy, unspecified: Secondary | ICD-10-CM

## 2012-05-15 DIAGNOSIS — I2699 Other pulmonary embolism without acute cor pulmonale: Secondary | ICD-10-CM

## 2012-05-15 HISTORY — DX: Polyneuropathy, unspecified: G62.9

## 2012-05-15 LAB — PROTIME-INR
INR: 3.1 (ref 2.00–3.50)
Protime: 37.2 Seconds — ABNORMAL HIGH (ref 10.6–13.4)

## 2012-05-15 NOTE — Progress Notes (Signed)
INR slightly supratherapeutic (3.1) on 7.5mg  daily except 10mg  W. No changes in meds or diet.  No problems. Will continue current dose, and recheck INR in 3 weeks.

## 2012-05-15 NOTE — Progress Notes (Signed)
Hematology and Oncology Follow Up Visit  [name] [name] [id number] [birth year] 61 y.o. 05/15/2012 3:05 PM   Principle Diagnosis: Encounter Diagnoses  Name Primary?  . Neuropathy, peripheral   . Encounter for long-term (current) use of anticoagulants   . Pulmonary embolism Yes  . DVT (deep venous thrombosis)      Interim History:    Followup visit for this 61 year old man referred to this office in August of 2011  for advice on long-term anticoagulation.  He has a complex clotting history.  He had bilateral DVTs and bilateral pulmonary emboli in 1990.  Not documented in our hospital records.   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  He had an additional right lower extremity DVT in 2010 about one month after a cholecystectomy.  He developed a right lower extremity superficial thrombosis when he was off Coumadin just seven days back in July of last year in anticipation of hernia surgery.  A hypercoagulation profile revealed none of the common abnormalities associated with clotting.  However, in view of his clinical history, I advised long-term Coumadin anticoagulation.  We have been managing his anticoagulation in our office.     He has chronic spine problems. He has a implanted TENS unit. He is developing progressive peripheral neuropathy. He has burning dysesthesias of his feet radiating up his legs. At times he can't feel his feet on the gas pedal. He has been tried on a number of different medications including Lyrica and Neurontin but doesn't tolerate these medications do to what he believes is significant swelling when he takes them. He has been evaluated by [facility]  from neurology. One doctor told him he thought he had gout and put him on allopurinol which also cause swelling and he stopped it. His only joint symptom is in a right shoulder joint where he had previous rotator cuff surgery.  He is concerned that his pain medication regimen is not  adequate. He is taking what I believe is Vicodin 5/325 one every 6 hours and doesn't feel that this is enough. He expresses his dissatisfaction with his multiple physicians because nobody wants to give him pain medication.  He denies any dyspnea, chest pain, or palpitations.  Medications: reviewed  Allergies:  Allergies  Allergen Reactions  . Aspirin Other (See Comments)    ulcers  . Celecoxib Other (See Comments)    Stomach irritation  . Ibuprofen Other (See Comments)    ulcers  . Lyrica (Pregabalin) Swelling    Review of Systems: Constitutional:   No constitutional symptoms Respiratory: See above Cardiovascular:  See above Gastrointestinal: No change in bowel habit he tends to be constipated Genito-Urinary: No urinary tract symptoms Musculoskeletal: Recurrent right shoulder pain Neurologic: See above Skin: No rash or ecchymosis Remaining ROS negative.  Physical Exam: Blood pressure 167/103, pulse 65, temperature 97.7 F (36.5 C), temperature source Oral, resp. rate 18, height 6\' 4"  (1.93 m), weight 256 lb 8 oz (116.348 kg). Wt Readings from Last 3 Encounters:  05/15/12 256 lb 8 oz (116.348 kg)  10/30/11 234 lb (106.142 kg)  03/05/11 248 lb (112.492 kg)     General appearance: Well-nourished African American man [name]: Pharynx no erythema or exudate Lymph nodes: No adenopathy Breasts: Lungs: Clear to auscultation resonant to percussion Heart: Regular rhythm no murmur Abdomen: Soft nontender no mass no organomegaly Extremities: Chronic mild to moderate swelling of his right calf. Calves nontender. Vascular: No cyanosis Neurologic: Motor strength is 5 over 5 all  extremities. Sensation is absent to vibration over the soft tissues of his feet but present over the tibia. Moderate decrease in vibration over the fingertips Skin: No rash or ecchymosis. Venous stasis changes of his legs.  Lab Results: Lab Results  Component Value Date   WBC 4.5 03/02/2012   HGB 13.9  03/02/2012   HCT 40.9 03/02/2012   MCV 69.9* 03/02/2012   PLT 189 03/02/2012     Chemistry      Component Value Date/Time   NA 137 03/02/2012 1824   K 3.3* 03/02/2012 1824   CL 98 03/02/2012 1824   CO2 25 03/02/2012 1824   BUN 8 03/02/2012 1824   CREATININE 0.93 03/02/2012 1824      Component Value Date/Time   CALCIUM 10.0 03/02/2012 1824   ALKPHOS 49 03/02/2012 1824   AST 16 03/02/2012 1824   ALT 14 03/02/2012 1824   BILITOT 0.4 03/02/2012 1824       Impression and Plan:  #1. 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 He remains on full dose Coumadin anticoagulation. INR today 3.1. I will continue current Coumadin dose 7.5 mg daily except 10 mg on Wednesdays.  #2. Peripheral neuropathy. Likely related to spine problems and previous surgeries in the past. I told him it would be okay to take 2 Vicodin every 4 hours as needed. Consider a trial of Ultram in addition to the Vicodin.   CC:. Dr. Lonia Blood; Dr. Emelia Loron; Dr. Charlsie Merles   Levert Feinstein, MD 9/6/20633:05 PM

## 2012-05-19 ENCOUNTER — Telehealth: Payer: Self-pay | Admitting: Oncology

## 2012-05-19 NOTE — Telephone Encounter (Signed)
Mailed appt today for 2014 lab and MD , all contact number are disconnected

## 2012-06-05 ENCOUNTER — Other Ambulatory Visit: Payer: Medicare Other | Admitting: Lab

## 2012-06-05 ENCOUNTER — Ambulatory Visit: Payer: Medicare Other

## 2012-06-09 ENCOUNTER — Ambulatory Visit: Payer: Medicare Other

## 2012-06-09 ENCOUNTER — Other Ambulatory Visit: Payer: Medicare Other | Admitting: Lab

## 2012-06-10 ENCOUNTER — Ambulatory Visit (HOSPITAL_BASED_OUTPATIENT_CLINIC_OR_DEPARTMENT_OTHER): Payer: Medicare Other | Admitting: Pharmacist

## 2012-06-10 ENCOUNTER — Other Ambulatory Visit (HOSPITAL_BASED_OUTPATIENT_CLINIC_OR_DEPARTMENT_OTHER): Payer: Medicare Other | Admitting: Lab

## 2012-06-10 DIAGNOSIS — I82409 Acute embolism and thrombosis of unspecified deep veins of unspecified lower extremity: Secondary | ICD-10-CM

## 2012-06-10 DIAGNOSIS — Z7901 Long term (current) use of anticoagulants: Secondary | ICD-10-CM

## 2012-06-10 DIAGNOSIS — I2699 Other pulmonary embolism without acute cor pulmonale: Secondary | ICD-10-CM

## 2012-06-10 DIAGNOSIS — Z5181 Encounter for therapeutic drug level monitoring: Secondary | ICD-10-CM

## 2012-06-10 DIAGNOSIS — I824Z9 Acute embolism and thrombosis of unspecified deep veins of unspecified distal lower extremity: Secondary | ICD-10-CM

## 2012-06-10 LAB — PROTIME-INR
INR: 2.5 (ref 2.00–3.50)
Protime: 30 Seconds — ABNORMAL HIGH (ref 10.6–13.4)

## 2012-06-10 LAB — POCT INR: INR: 2.5

## 2012-06-10 NOTE — Progress Notes (Signed)
Pt still very frustrated with his current situation regarding unemployment and social security.  He is therapeutic today at 2.5.  We will continue same dose of 7.5 mg daily with 10 mg on Wed.  I gave him #50 coumadin 5mg  tablets Lot #[id number] EXP 6/14.

## 2012-06-10 NOTE — Patient Instructions (Signed)
Continue 7.5mg  daily except 10mg  on Wednesday. Recheck INR 07/09/12 at 11:30 am for lab, 1145 for Coumadin Clinic.

## 2012-06-11 ENCOUNTER — Other Ambulatory Visit: Payer: Medicare Other | Admitting: Lab

## 2012-07-09 ENCOUNTER — Ambulatory Visit: Payer: Medicare Other

## 2012-07-09 ENCOUNTER — Other Ambulatory Visit: Payer: Medicare Other | Admitting: Lab

## 2012-07-09 ENCOUNTER — Telehealth: Payer: Self-pay | Admitting: Pharmacist

## 2012-07-10 ENCOUNTER — Telehealth: Payer: Self-pay | Admitting: Pharmacist

## 2012-07-16 ENCOUNTER — Ambulatory Visit: Payer: Medicare Other

## 2012-07-16 ENCOUNTER — Other Ambulatory Visit: Payer: Medicare Other | Admitting: Lab

## 2012-07-22 ENCOUNTER — Telehealth: Payer: Self-pay | Admitting: Pharmacist

## 2012-07-22 ENCOUNTER — Other Ambulatory Visit: Payer: Medicare Other | Admitting: Lab

## 2012-07-22 ENCOUNTER — Ambulatory Visit: Payer: Medicare Other

## 2012-07-22 NOTE — Telephone Encounter (Signed)
Patient missed coumadin clinic appointment this AM. Called to reschedule. Patient states he forgot appointment this AM and wanted to reschedule for next week. Will see patient back on Monday 11/18 for lab @ 11:30am and Coumadin Clinic @ 11:45 am.  Christell Faith, PharmD

## 2012-07-27 ENCOUNTER — Ambulatory Visit: Payer: Medicare Other

## 2012-07-27 ENCOUNTER — Other Ambulatory Visit: Payer: Medicare Other | Admitting: Lab

## 2012-07-28 ENCOUNTER — Encounter: Payer: Self-pay | Admitting: Pharmacist

## 2012-07-28 NOTE — Progress Notes (Signed)
Pt missed his Coumadin clinic appt yesterday. I s/w pt over phone today.  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  He plans to come home Thursday or Friday. Pt states he "tastes blood".  However, he has not had any bleeding other than when he cut himself shaving it took a while to stop the bleeding. Pt rescheduled his CC appt to this Friday at 9:30 am for lab; 9:45 am for CC. Marily Lente, Pharm.D.

## 2012-07-31 ENCOUNTER — Ambulatory Visit: Payer: Medicare Other

## 2012-07-31 ENCOUNTER — Other Ambulatory Visit: Payer: Medicare Other | Admitting: Lab

## 2012-07-31 ENCOUNTER — Telehealth: Payer: Self-pay | Admitting: Pharmacist

## 2012-07-31 NOTE — Telephone Encounter (Signed)
Patient missed coumadin clinic appointment again today (may still be out of town). Called and left voicemail for patient to call coumadin pharmacy back at [phone] to reschedule lab and coumadin clinic appointment for next week. CE

## 2012-08-13 ENCOUNTER — Ambulatory Visit (HOSPITAL_BASED_OUTPATIENT_CLINIC_OR_DEPARTMENT_OTHER): Payer: Medicaid Other | Admitting: Pharmacist

## 2012-08-13 ENCOUNTER — Other Ambulatory Visit (HOSPITAL_BASED_OUTPATIENT_CLINIC_OR_DEPARTMENT_OTHER): Payer: Medicaid Other | Admitting: Lab

## 2012-08-13 DIAGNOSIS — I82409 Acute embolism and thrombosis of unspecified deep veins of unspecified lower extremity: Secondary | ICD-10-CM

## 2012-08-13 DIAGNOSIS — I2699 Other pulmonary embolism without acute cor pulmonale: Secondary | ICD-10-CM

## 2012-08-13 LAB — POCT INR: INR: 1.3

## 2012-08-13 LAB — PROTIME-INR
INR: 1.3 — ABNORMAL LOW (ref 2.00–3.50)
Protime: 15.6 Seconds — ABNORMAL HIGH (ref 10.6–13.4)

## 2012-08-13 NOTE — Patient Instructions (Addendum)
INR is low likely due to diet  Take 10 mg today and tomorrow then resume normal schedule of 1.5 tablets daily except for 2 tablets on Wednesdays Return next Friday 12/13 at 1030am for lab and 1045 am for coumadin clinic

## 2012-08-13 NOTE — Progress Notes (Signed)
INR subtherapeutic at 1.3. Pt states no missed doses or bleeding or leg or chest pain. Patient has been stable on this regimen but his diet this week has included much more turnip greens etc likely bringing down the INR. Pt also is currently taking Flagyl and augmentin for 10 days which may increase the effects of coumadin and the INR. He will complete these antibiotics on Saturday. Instructed patient to take 2 tablets (10mg ) today and tomorrow and then on Saturday resume his normal dose of 7.5mg  daily except for 10 mg on Wednesdays and we will see the patient back next week on 08/21/12 at 1030 am for lab and 1045am for coumadin clinic

## 2012-08-21 ENCOUNTER — Other Ambulatory Visit: Payer: Medicaid Other

## 2012-08-21 ENCOUNTER — Ambulatory Visit: Payer: Medicaid Other

## 2012-08-27 ENCOUNTER — Telehealth: Payer: Self-pay | Admitting: Pharmacist

## 2012-08-27 NOTE — Telephone Encounter (Signed)
Pt missed coumadin clinic appointment today. Called to reschedule. Pt stated he was busy running errands today and could not come in. Pt rescheduled for next week on Tuesday at 1030am for lab 1045 am for coumadin clinic. Christell Faith, PharmD

## 2012-09-01 ENCOUNTER — Telehealth: Payer: Self-pay | Admitting: Pharmacist

## 2012-09-01 ENCOUNTER — Ambulatory Visit (HOSPITAL_BASED_OUTPATIENT_CLINIC_OR_DEPARTMENT_OTHER): Payer: Medicaid Other | Admitting: Pharmacist

## 2012-09-01 ENCOUNTER — Other Ambulatory Visit (HOSPITAL_BASED_OUTPATIENT_CLINIC_OR_DEPARTMENT_OTHER): Payer: Medicaid Other | Admitting: Lab

## 2012-09-01 DIAGNOSIS — I2699 Other pulmonary embolism without acute cor pulmonale: Secondary | ICD-10-CM

## 2012-09-01 DIAGNOSIS — I82409 Acute embolism and thrombosis of unspecified deep veins of unspecified lower extremity: Secondary | ICD-10-CM

## 2012-09-01 LAB — PROTIME-INR
INR: 2.2 (ref 2.00–3.50)
Protime: 26.4 Seconds — ABNORMAL HIGH (ref 10.6–13.4)

## 2012-09-01 NOTE — Telephone Encounter (Signed)
Called and informed patient that lab/cc appt times have been moved forward by 15 minutes due to lab availability.  New appts for 09/18/12 are at 1345 for lab, 1400 for CC.  Pt communicated understanding.

## 2012-09-01 NOTE — Patient Instructions (Signed)
INR therapeutic today on 7.5mg  daily except 10mg  on WTh Pt did miss one dose last night while traveling back into town.  A doctor at the hospital he went to while hospitalized for diverticulitis increased his Coumadin dose to 7.5mg  daily except 10mg  on WTh in response to a subtherapeutic INR.  Unclear from pt how long he has been on this dose.  This low INR may have been from Thanksgiving day. No problems with bleeding or bruising.  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  Will have pt continue current dose, and recheck INR in 2 weeks.

## 2012-09-01 NOTE — Progress Notes (Signed)
INR therapeutic today on 7.5mg  daily except 10mg  on WTh   Pt did miss one dose last night while traveling back into town. A doctor at the hospital he went to while hospitalized for diverticulitis increased his Coumadin dose to 7.5mg  daily except 10mg  on WTh in response to a subtherapeutic INR. Unclear from pt how long he has been on this dose. This low INR may have been from Thanksgiving day.   No problems with bleeding or bruising. 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  Medication list has been updated.  Will have pt continue current dose, and recheck INR in 2 weeks.

## 2012-09-01 NOTE — Telephone Encounter (Signed)
Message copied by Jacqlyn Larsen on Tue Sep 01, 2012  2:46 PM ------      Message from: Tami Ribas      Created: Tue Sep 01, 2012  2:41 PM      Regarding: RE: lab/anticoag for 09/18/12       [name]             Lab was changed to 1:45 due to no lab spots            [name]            ----- Message -----         From: Davene Costain Itmann, [place]         Sent: 09/01/2012  11:12 AM           To: Tami Ribas, [facility]      Subject: lab/anticoag for 09/18/12                                 Please schedule [name] [name] (MRN [id number]#) for 09/18/12# at 1400# for lab & 1415# for Coumadin Clinic.      Please reply or call Coumadin Clinic Pharmacist if not able to match this date/time       MD is [place]#.      Thanks!!

## 2012-09-10 ENCOUNTER — Other Ambulatory Visit: Payer: Self-pay

## 2012-09-10 ENCOUNTER — Telehealth: Payer: Self-pay | Admitting: Pharmacist

## 2012-09-10 DIAGNOSIS — Z7901 Long term (current) use of anticoagulants: Secondary | ICD-10-CM

## 2012-09-10 DIAGNOSIS — R1032 Left lower quadrant pain: Secondary | ICD-10-CM

## 2012-09-10 DIAGNOSIS — I2699 Other pulmonary embolism without acute cor pulmonale: Secondary | ICD-10-CM

## 2012-09-10 MED ORDER — WARFARIN SODIUM 5 MG PO TABS: 7.5000 mg | ORAL_TABLET | Freq: Every day | ORAL | Status: DC

## 2012-09-10 MED ORDER — OMEPRAZOLE 20 MG PO CPDR: 20.0000 mg | DELAYED_RELEASE_CAPSULE | Freq: Every day | ORAL | Status: DC

## 2012-09-10 NOTE — Telephone Encounter (Signed)
Pt had left VM on Coumadin clinic on 09/08/12 stating that he had noticed blood in stool for past couple days.  I spoke to pt today and told him that we could bring pt in to check PT/INR today.  Last PT/INR check was on 09/01/12 = 2.2.  INR has been at goal range or below since August 2013.  I recommended pt call PCP, which he is planning on doing today, since unlikely PT/INR will be above goal.  Pt knows to call Coumadin clinic if he has any other questions or would like to come in to have PT/INR checked.

## 2012-09-18 ENCOUNTER — Other Ambulatory Visit: Payer: Medicaid Other

## 2012-09-18 ENCOUNTER — Emergency Department (HOSPITAL_COMMUNITY)
Admission: EM | Admit: 2012-09-18 | Discharge: 2012-09-18 | Disposition: A | Discharge status: Home / Self Care | Payer: Medicare Other | Attending: Emergency Medicine | Admitting: Emergency Medicine

## 2012-09-18 ENCOUNTER — Telehealth: Payer: Self-pay | Admitting: Pharmacist

## 2012-09-18 ENCOUNTER — Encounter (HOSPITAL_COMMUNITY): Payer: Self-pay | Admitting: Emergency Medicine

## 2012-09-18 ENCOUNTER — Emergency Department (HOSPITAL_COMMUNITY): Payer: Medicare Other

## 2012-09-18 ENCOUNTER — Ambulatory Visit: Payer: Medicaid Other

## 2012-09-18 DIAGNOSIS — Z79899 Other long term (current) drug therapy: Secondary | ICD-10-CM | POA: Insufficient documentation

## 2012-09-18 DIAGNOSIS — N509 Disorder of male genital organs, unspecified: Secondary | ICD-10-CM | POA: Insufficient documentation

## 2012-09-18 DIAGNOSIS — Z862 Personal history of diseases of the blood and blood-forming organs and certain disorders involving the immune mechanism: Secondary | ICD-10-CM | POA: Insufficient documentation

## 2012-09-18 DIAGNOSIS — Z8619 Personal history of other infectious and parasitic diseases: Secondary | ICD-10-CM | POA: Insufficient documentation

## 2012-09-18 DIAGNOSIS — M549 Dorsalgia, unspecified: Secondary | ICD-10-CM | POA: Insufficient documentation

## 2012-09-18 DIAGNOSIS — R110 Nausea: Secondary | ICD-10-CM | POA: Insufficient documentation

## 2012-09-18 DIAGNOSIS — M109 Gout, unspecified: Secondary | ICD-10-CM | POA: Insufficient documentation

## 2012-09-18 DIAGNOSIS — N5082 Scrotal pain: Secondary | ICD-10-CM

## 2012-09-18 DIAGNOSIS — R05 Cough: Secondary | ICD-10-CM | POA: Insufficient documentation

## 2012-09-18 DIAGNOSIS — R079 Chest pain, unspecified: Secondary | ICD-10-CM | POA: Insufficient documentation

## 2012-09-18 DIAGNOSIS — Z8669 Personal history of other diseases of the nervous system and sense organs: Secondary | ICD-10-CM | POA: Insufficient documentation

## 2012-09-18 DIAGNOSIS — R062 Wheezing: Secondary | ICD-10-CM | POA: Insufficient documentation

## 2012-09-18 DIAGNOSIS — F17200 Nicotine dependence, unspecified, uncomplicated: Secondary | ICD-10-CM | POA: Insufficient documentation

## 2012-09-18 DIAGNOSIS — Z7901 Long term (current) use of anticoagulants: Secondary | ICD-10-CM | POA: Insufficient documentation

## 2012-09-18 DIAGNOSIS — R059 Cough, unspecified: Secondary | ICD-10-CM | POA: Insufficient documentation

## 2012-09-18 DIAGNOSIS — Z8719 Personal history of other diseases of the digestive system: Secondary | ICD-10-CM | POA: Insufficient documentation

## 2012-09-18 DIAGNOSIS — I10 Essential (primary) hypertension: Secondary | ICD-10-CM | POA: Insufficient documentation

## 2012-09-18 DIAGNOSIS — I82409 Acute embolism and thrombosis of unspecified deep veins of unspecified lower extremity: Secondary | ICD-10-CM | POA: Insufficient documentation

## 2012-09-18 DIAGNOSIS — R0602 Shortness of breath: Secondary | ICD-10-CM | POA: Insufficient documentation

## 2012-09-18 DIAGNOSIS — Z9889 Other specified postprocedural states: Secondary | ICD-10-CM | POA: Insufficient documentation

## 2012-09-18 DIAGNOSIS — R109 Unspecified abdominal pain: Secondary | ICD-10-CM | POA: Insufficient documentation

## 2012-09-18 LAB — COMPREHENSIVE METABOLIC PANEL
ALT: 13 U/L (ref 0–53)
AST: 36 U/L (ref 0–37)
Albumin: 3.6 g/dL (ref 3.5–5.2)
Alkaline Phosphatase: 43 U/L (ref 39–117)
BUN: 8 mg/dL (ref 6–23)
CO2: 25 mEq/L (ref 19–32)
Calcium: 9.2 mg/dL (ref 8.4–10.5)
Chloride: 99 mEq/L (ref 96–112)
Creatinine, Ser: 0.79 mg/dL (ref 0.50–1.35)
GFR calc Af Amer: >90 mL/min (ref >90)
GFR calc non Af Amer: >90 mL/min (ref >90)
Glucose, Bld: 91 mg/dL (ref 70–99)
Potassium: 4.5 mEq/L (ref 3.5–5.1)
Sodium: 135 mEq/L (ref 135–145)
Total Bilirubin: 0.2 mg/dL — ABNORMAL LOW (ref 0.3–1.2)
Total Protein: 7 g/dL (ref 6.0–8.3)

## 2012-09-18 LAB — CBC WITH DIFFERENTIAL/PLATELET
Basophils Absolute: 0 10*3/uL (ref 0.0–0.1)
Basophils Relative: 1 % (ref 0–1)
Eosinophils Absolute: 0.1 10*3/uL (ref 0.0–0.7)
Eosinophils Relative: 2 % (ref 0–5)
HCT: 41.3 % (ref 39.0–52.0)
Hemoglobin: 13.9 g/dL (ref 13.0–17.0)
Lymphocytes Relative: 36 % (ref 12–46)
Lymphs Abs: 1.5 10*3/uL (ref 0.7–4.0)
MCH: 23.1 pg — ABNORMAL LOW (ref 26.0–34.0)
MCHC: 33.7 g/dL (ref 30.0–36.0)
MCV: 68.5 fL — ABNORMAL LOW (ref 78.0–100.0)
Monocytes Absolute: 0.4 10*3/uL (ref 0.1–1.0)
Monocytes Relative: 9 % (ref 3–12)
Neutro Abs: 2.2 10*3/uL (ref 1.7–7.7)
Neutrophils Relative %: 53 % (ref 43–77)
Platelets: 206 10*3/uL (ref 150–400)
RBC: 6.03 MIL/uL — ABNORMAL HIGH (ref 4.22–5.81)
RDW: 16.2 % — ABNORMAL HIGH (ref 11.5–15.5)
WBC: 4.1 10*3/uL (ref 4.0–10.5)

## 2012-09-18 LAB — TROPONIN I: Troponin I: <0.3 ng/mL (ref <0.30)

## 2012-09-18 LAB — URINALYSIS, ROUTINE W REFLEX MICROSCOPIC
Bilirubin Urine: NEGATIVE
Glucose, UA: NEGATIVE mg/dL
Hgb urine dipstick: NEGATIVE
Ketones, ur: NEGATIVE mg/dL
Leukocytes, UA: NEGATIVE
Nitrite: NEGATIVE
Protein, ur: NEGATIVE mg/dL
Specific Gravity, Urine: 1.012 (ref 1.005–1.030)
Urobilinogen, UA: 0.2 mg/dL (ref 0.0–1.0)
pH: 7 (ref 5.0–8.0)

## 2012-09-18 LAB — LIPASE, BLOOD: Lipase: 24 U/L (ref 11–59)

## 2012-09-18 LAB — PROTIME-INR
INR: 2.68 — ABNORMAL HIGH (ref 0.00–1.49)
Prothrombin Time: 27.2 seconds — ABNORMAL HIGH (ref 11.6–15.2)

## 2012-09-18 MED ORDER — ALBUTEROL SULFATE HFA 108 (90 BASE) MCG/ACT IN AERS: 1.0000 | INHALATION_SPRAY | Freq: Four times a day (QID) | RESPIRATORY_TRACT | Status: DC | PRN

## 2012-09-18 MED ORDER — ONDANSETRON HCL 4 MG/2ML IJ SOLN
4.0000 mg | Freq: Once | INTRAMUSCULAR | Status: AC
  Administered 2012-09-18: 4 mg via INTRAVENOUS
  Filled 2012-09-18: qty 2

## 2012-09-18 MED ORDER — IOHEXOL 300 MG/ML  SOLN
100.0000 mL | Freq: Once | INTRAMUSCULAR | Status: AC | PRN
  Administered 2012-09-18: 100 mL via INTRAVENOUS

## 2012-09-18 MED ORDER — MORPHINE SULFATE 4 MG/ML IJ SOLN
8.0000 mg | Freq: Once | INTRAMUSCULAR | Status: AC
  Administered 2012-09-18: 8 mg via INTRAVENOUS
  Filled 2012-09-18: qty 2

## 2012-09-18 MED ORDER — IPRATROPIUM BROMIDE 0.02 % IN SOLN
0.5000 mg | Freq: Once | RESPIRATORY_TRACT | Status: AC
  Administered 2012-09-18: 0.5 mg via RESPIRATORY_TRACT
  Filled 2012-09-18: qty 2.5

## 2012-09-18 MED ORDER — ALBUTEROL SULFATE (5 MG/ML) 0.5% IN NEBU
5.0000 mg | INHALATION_SOLUTION | Freq: Once | RESPIRATORY_TRACT | Status: AC
  Administered 2012-09-18: 5 mg via RESPIRATORY_TRACT
  Filled 2012-09-18: qty 0.5

## 2012-09-18 MED ORDER — HYDROMORPHONE HCL PF 1 MG/ML IJ SOLN
1.0000 mg | Freq: Once | INTRAMUSCULAR | Status: AC
  Administered 2012-09-18: 1 mg via INTRAVENOUS
  Filled 2012-09-18: qty 1

## 2012-09-18 MED ORDER — OXYCODONE-ACETAMINOPHEN 5-325 MG PO TABS: 2.0000 | ORAL_TABLET | ORAL | Status: DC | PRN

## 2012-09-18 MED ORDER — SODIUM CHLORIDE 0.9 % IV BOLUS (SEPSIS)
1000.0000 mL | Freq: Once | INTRAVENOUS | Status: AC
  Administered 2012-09-18: 1000 mL via INTRAVENOUS

## 2012-09-18 NOTE — ED Notes (Signed)
Companion remains at bedside.

## 2012-09-18 NOTE — ED Notes (Addendum)
Pt presenting to ed with c/o right side abdominal pain with positive shortness of breath. Pt states he has history of diverticulosis. Pt denies nausea, vomiting and diarrhea at this time. Pt also with c/o chest pain onset since yesterday

## 2012-09-18 NOTE — Telephone Encounter (Signed)
Pt's wife called to inform CC that pt will not be able to make lab/CC appt. Pt is in ER for diverticulitis exacerbation.  She will have pt call Coumadin Clinic to reschedule as soon as he is able.

## 2012-09-18 NOTE — ED Provider Notes (Addendum)
History     CSN: [id number]  Arrival date & time 09/18/12  1130   First MD Initiated Contact with Patient 09/18/12 1152      Chief Complaint  Patient presents with  . Abdominal Pain  . Shortness of Breath  . Chest Pain    (Consider location/radiation/quality/duration/timing/severity/associated sxs/prior treatment) The history is provided by the patient.  [name] [name] is a 62 y.o. male hx of HTN, diverticulitis, here with RLQ pain, SOB, R flank pain. He was recently admitted for diverticulitis at [facility] 2 months ago. For the last week, he has intermittent nausea and RLQ pain. It is crampy pain, intermittent, radiate down R testicle. No dysuria but urinating less. Also R flank pain that is intermittent. No constipation or diarrhea. + SOB when he takes a deep breath. He is on coumadin for R leg DVT. He is still smoking and notes that he is wheezing occasionally.    Past Medical History  Diagnosis Date  . Hypertension   . History of blood clots   . Diverticulitis   . Hernia   . Diverticulosis   . Shingles   . Gout   . Back pain   . Neuropathy, peripheral 05/15/2012    Past Surgical History  Procedure Date  . Back surgery   . Hernia repair   . Hiatal hernia repair     Family History  Problem Relation Age of Onset  . Prostate cancer Father   . Diabetes Mother   . Colon cancer Maternal Uncle   . Pancreatic cancer Paternal Uncle     History  Substance Use Topics  . Smoking status: Current Every Day Smoker  . Smokeless tobacco: Not on file  . Alcohol Use: No      Review of Systems  Respiratory: Positive for shortness of breath.   Cardiovascular: Positive for chest pain.  Gastrointestinal: Positive for nausea and abdominal pain.  All other systems reviewed and are negative.    Allergies  Aspirin; Celecoxib; Ibuprofen; and Lyrica  Home Medications   Current Outpatient Rx  Name  Route  Sig  Dispense  Refill  . BENAZEPRIL HCL 40 MG PO TABS   Oral  Take 80 mg by mouth Daily.         Marland Kitchen DIAZEPAM 10 MG PO TABS   Oral   Take 1 tablet by mouth At bedtime as needed. ANXIETY         . HYDROCHLOROTHIAZIDE 25 MG PO TABS   Oral   Take 1 tablet by mouth Daily.         Marland Kitchen METHOCARBAMOL 750 MG PO TABS   Oral   Take 750 mg by mouth 3 (three) times daily as needed. MUSCLE SPASM         . METOPROLOL SUCCINATE ER 50 MG PO TB24   Oral   Take 50 mg by mouth daily. Take with or immediately following a meal.         . NIFEDICAL XL 60 MG PO TB24   Oral   Take 120 mg by mouth daily.          Marland Kitchen OMEPRAZOLE 20 MG PO CPDR   Oral   Take 1 capsule (20 mg total) by mouth daily.   30 capsule   4   . OXYCODONE HCL 15 MG PO TABS   Oral   Take 15 mg by mouth Every 6 hours.         Marland Kitchen VITAMIN D (ERGOCALCIFEROL) 50000 UNITS PO  CAPS   Oral   Take 50,000 Units by mouth every 7 (seven) days. Every sunday         . VOLTAREN 1 % TD GEL   Topical   Apply 1 application topically as needed. Apply to painful areas as needed.         . WARFARIN SODIUM 5 MG PO TABS   Oral   Take 1.5-2 tablets (7.5-10 mg total) by mouth daily. 7.5 mg daily except 10mg  on Wednesdays and thursdays or as directed.   75 tablet   3     BP 127/74  Pulse 65  Temp 98 F (36.7 C) (Oral)  Resp 18  SpO2 97%  Physical Exam  Nursing note and vitals reviewed. Constitutional: He is oriented to person, place, and time.       Uncomfortable   HENT:  Head: Normocephalic.  Mouth/Throat: Oropharynx is clear and moist.  Eyes: Conjunctivae normal are normal. Pupils are equal, round, and reactive to light.  Neck: Normal range of motion. Neck supple.  Cardiovascular: Normal rate, regular rhythm and normal heart sounds.   Pulmonary/Chest: Effort normal.       Diffuse wheezing, no retractions   Abdominal: Soft.       Midline incision well healed. + LLQ tenderness, no rebound   Genitourinary:       L testicle mildly tender, good cremasteric reflex.     Musculoskeletal: Normal range of motion.  Neurological: He is alert and oriented to person, place, and time.  Skin: Skin is warm and dry.  Psychiatric: He has a normal mood and affect. His behavior is normal. Judgment and thought content normal.    ED Course  Procedures (including critical care time)  Labs Reviewed  CBC WITH DIFFERENTIAL - Abnormal; Notable for the following:    RBC 6.03 (*)     MCV 68.5 (*)     MCH 23.1 (*)     RDW 16.2 (*)     All other components within normal limits  COMPREHENSIVE METABOLIC PANEL - Abnormal; Notable for the following:    Total Bilirubin 0.2 (*)     All other components within normal limits  PROTIME-INR - Abnormal; Notable for the following:    Prothrombin Time 27.2 (*)     INR 2.68 (*)     All other components within normal limits  URINALYSIS, ROUTINE W REFLEX MICROSCOPIC - Abnormal; Notable for the following:    APPearance CLOUDY (*)     All other components within normal limits  LIPASE, BLOOD  TROPONIN I   Dg Chest 2 View  09/18/2012  *RADIOLOGY REPORT*  Clinical Data: Short of breath and chest pain  CHEST - 2 VIEW  Comparison: 03/02/2012  Findings: Heart size is mildly enlarged.  No pleural effusion or edema.  No airspace consolidation.  Lung volumes appear normal.  IMPRESSION:  1.  No acute cardiopulmonary abnormalities.   Original Report Authenticated By: Signa Kell, M.D.    US Scrotum  09/18/2012  *RADIOLOGY REPORT*  Clinical Data:  Left testicular pain question torsion  SCROTAL ULTRASOUND DOPPLER ULTRASOUND OF THE TESTICLES  Technique: Complete ultrasound examination of the testicles, epididymis, and other scrotal structures was performed.  Color and spectral Doppler ultrasound were also utilized to evaluate blood flow to the testicles.  Comparison:  01/28/2011  Findings:  Right testis:  4.5 x 2.0 x 3.3 cm.  Mildly heterogeneous echogenicity, minimally striated in appearance.  No focal mass lesions or calcification.  Internal blood  flow  present on color Doppler imaging.  Left testis:  4.2 x 2.7 x 3.0 cm.  Heterogeneous echogenicity, slightly hypoechoic versus right testis, demonstrating a striated pattern which appears slightly progressive since the previous exam. No discrete mass or calcification. Tiny 4 mm diameter intratesticular cyst.  Right epididymis:  Small cyst right epididymis 5 x 7 x 6 mm.  Left epididymis:  Normal in size and appearance.  Hydrocele:  Present on the right, very small in size.  Varicocele:  Absent bilaterally  Pulsed Doppler interrogation of both testes demonstrates intratesticular venous and low resistance arterial flow bilaterally.  Skin thickening of the scrotum noted.  IMPRESSION: No definite evidence of testicular mass or torsion. Striated appearance of the testicular parenchyma bilaterally, more pronounced on the left, aggressive since the previous exam. This is a nonspecific finding, question result of chronic post inflammatory process or often interstitial fibrosis but other etiologies including infiltrative processes are not completely excluded. Urologic follow-up recommended.   Original Report Authenticated By: Ulyses Southward, M.D.    Ct Abdomen Pelvis W Contrast  09/18/2012  *RADIOLOGY REPORT*  Clinical Data: Right-sided abdominal pain.  History of diverticulitis  CT ABDOMEN AND PELVIS WITH CONTRAST  Technique:  Multidetector CT imaging of the abdomen and pelvis was performed following the standard protocol during bolus administration of intravenous contrast.  Contrast: OMNIPAQUE IOHEXOL 300 MG/ML  SOLN  Comparison: None.  Findings: Atelectasis noted in both lung bases.  There is no pericardial or pleural effusion.  No focal liver abnormalities.  Prior cholecystectomy.  The CBD measures up to 10 mm, image 48/series 6.  No obstructing stone or mass noted.  Normal appearance of the pancreas.  The spleen is normal.  35 HU nodule within the right adrenal gland measures 1.4 cm, image 22.  The left adrenal  gland appears normal. Normal appearance of the right kidney.  The left kidney is normal.  Urinary bladder is unremarkable.  Abdominal aorta has a normal caliber.  There is no aneurysm.  A filter is discovered within the distal portion of the IVC and extends into the right common iliac vein.  Soft tissue infiltration within the root of the mesentery is identified left celiac lymph node measures 0.7 cm.  Gastrohepatic ligament lymph node measures 0.8 cm, image 23.  No adenopathy within the upper abdomen or pelvis.  The stomach appears normal.  The small bowel loops are unremarkable.  The appendix is not visualized.  No secondary signs of acute appendicitis.  The proximal colon appears normal.  The distal colon is also within normal limits.  Review of the visualized bony structures is significant for multilevel degenerative disc disease.  No acute bony abnormalities noted.  There is some postsurgical change within the left inguinal region from previous hernia repair.  IMPRESSION:  1.  No acute findings identified within the abdomen or pelvis. 2.  Prior cholecystectomy with increased caliber of the CBD 3.  Mild, nonspecific soft tissue infiltration within the small bowel mesentery.  No adenopathy identified. 4.  IVC filter within the distal IVC and extending into the right common iliac vein.   Original Report Authenticated By: Signa Kell, M.D.    [place] Art/ven Flow Abd Pelv Doppler  09/18/2012  *RADIOLOGY REPORT*  Clinical Data:  Left testicular pain question torsion  SCROTAL ULTRASOUND DOPPLER ULTRASOUND OF THE TESTICLES  Technique: Complete ultrasound examination of the testicles, epididymis, and other scrotal structures was performed.  Color and spectral Doppler ultrasound were also utilized to evaluate blood flow to  the testicles.  Comparison:  01/28/2011  Findings:  Right testis:  4.5 x 2.0 x 3.3 cm.  Mildly heterogeneous echogenicity, minimally striated in appearance.  No focal mass lesions or calcification.   Internal blood flow present on color Doppler imaging.  Left testis:  4.2 x 2.7 x 3.0 cm.  Heterogeneous echogenicity, slightly hypoechoic versus right testis, demonstrating a striated pattern which appears slightly progressive since the previous exam. No discrete mass or calcification. Tiny 4 mm diameter intratesticular cyst.  Right epididymis:  Small cyst right epididymis 5 x 7 x 6 mm.  Left epididymis:  Normal in size and appearance.  Hydrocele:  Present on the right, very small in size.  Varicocele:  Absent bilaterally  Pulsed Doppler interrogation of both testes demonstrates intratesticular venous and low resistance arterial flow bilaterally.  Skin thickening of the scrotum noted.  IMPRESSION: No definite evidence of testicular mass or torsion. Striated appearance of the testicular parenchyma bilaterally, more pronounced on the left, aggressive since the previous exam. This is a nonspecific finding, question result of chronic post inflammatory process or often interstitial fibrosis but other etiologies including infiltrative processes are not completely excluded. Urologic follow-up recommended.   Original Report Authenticated By: Ulyses Southward, M.D.      No diagnosis found.   Date: 09/18/2012  Rate: 69  Rhythm: normal sinus rhythm  QRS Axis: left  Intervals: normal  ST/T Wave abnormalities: normal  Conduction Disutrbances:none  Narrative Interpretation:   Old EKG Reviewed: unchanged    MDM  Satya Buttram is a 62 y.o. male here with SOB, and ab pain. Ab pain likely from diverticulitis vs torsion. Will get labs, CT ab/pel and scrotal US. SOB likely from wheezing from smoking. Will give nebs. Consider PE but he is on coumadin so will check INR. Atypical presentation for cardiac but he has multiple risk factors so will get trop.   3:20 PM Patient's labs unremarkable. CT ab/pel showed no diverticulitis. Pain minimally improved. US scrotum pending. INR therapeutic so PE unlikely. Trop neg x 1 and  patient's symptoms have been going on for several days.   4:13 PM Pain improved. US scrotum showed nonspecific tissue infiltration of the testicle. He has a urologist that he can see. Will d/c home on percocet for now.       Richardean Canal, MD 09/18/12 1614  Richardean Canal, MD 09/18/12 [phone]

## 2012-09-24 ENCOUNTER — Telehealth: Payer: Self-pay | Admitting: Pharmacist

## 2012-10-05 ENCOUNTER — Ambulatory Visit: Payer: Medicare Other

## 2012-10-05 ENCOUNTER — Other Ambulatory Visit: Payer: Medicare Other

## 2012-10-06 ENCOUNTER — Telehealth: Payer: Self-pay | Admitting: Pharmacist

## 2012-10-08 ENCOUNTER — Ambulatory Visit (HOSPITAL_BASED_OUTPATIENT_CLINIC_OR_DEPARTMENT_OTHER): Payer: Medicare Other | Admitting: Pharmacist

## 2012-10-08 ENCOUNTER — Other Ambulatory Visit: Payer: Medicare Other | Admitting: Lab

## 2012-10-08 DIAGNOSIS — I2699 Other pulmonary embolism without acute cor pulmonale: Secondary | ICD-10-CM

## 2012-10-08 DIAGNOSIS — I82409 Acute embolism and thrombosis of unspecified deep veins of unspecified lower extremity: Secondary | ICD-10-CM

## 2012-10-08 LAB — PROTIME-INR
INR: 3.9 — ABNORMAL HIGH (ref 2.00–3.50)
Protime: 46.8 Seconds — ABNORMAL HIGH (ref 10.6–13.4)

## 2012-10-08 LAB — POCT INR: INR: 3.9

## 2012-10-08 NOTE — Progress Notes (Signed)
INR above goal today.  This may be due to pt taking a few doses of Flagyl as he was self treating a diverticulitis pain. Pain resolved. Will hold Coumadin today.  On 10/09/12, resume coumadin at 7.5mg  daily except 10mg  on Wed and Sat.  Recheck INR in 1 week, 10/16/12; Lab at 1:30pm and coumadin clinic at 1:45pm.  Pt was previously taking 7.5mg  daily except 10mg  on W&Thu. He has decided to evenly spread out his 10mg  days during the week and take 7.5mg  daily except 10mg  on Wed&Sat.

## 2012-10-08 NOTE — Patient Instructions (Signed)
Hold Coumadin today.  On 10/09/12, resume coumadin at 7.5mg  daily except 10mg  on Wed and Sat.  Recheck INR in 1 week, 10/16/12; Lab at 1:30pm and coumadin clinic at 1:45pm.

## 2012-10-12 ENCOUNTER — Encounter (HOSPITAL_COMMUNITY): Payer: Self-pay | Admitting: Emergency Medicine

## 2012-10-12 ENCOUNTER — Emergency Department (HOSPITAL_COMMUNITY)
Admission: EM | Admit: 2012-10-12 | Discharge: 2012-10-12 | Disposition: A | Discharge status: Home / Self Care | Payer: Medicare Other | Attending: Emergency Medicine | Admitting: Emergency Medicine

## 2012-10-12 DIAGNOSIS — R5383 Other fatigue: Secondary | ICD-10-CM | POA: Insufficient documentation

## 2012-10-12 DIAGNOSIS — R109 Unspecified abdominal pain: Secondary | ICD-10-CM | POA: Insufficient documentation

## 2012-10-12 DIAGNOSIS — Y9389 Activity, other specified: Secondary | ICD-10-CM | POA: Insufficient documentation

## 2012-10-12 DIAGNOSIS — M545 Low back pain, unspecified: Secondary | ICD-10-CM | POA: Insufficient documentation

## 2012-10-12 DIAGNOSIS — Z7901 Long term (current) use of anticoagulants: Secondary | ICD-10-CM | POA: Insufficient documentation

## 2012-10-12 DIAGNOSIS — Y929 Unspecified place or not applicable: Secondary | ICD-10-CM | POA: Insufficient documentation

## 2012-10-12 DIAGNOSIS — R5381 Other malaise: Secondary | ICD-10-CM | POA: Insufficient documentation

## 2012-10-12 DIAGNOSIS — F17200 Nicotine dependence, unspecified, uncomplicated: Secondary | ICD-10-CM | POA: Insufficient documentation

## 2012-10-12 DIAGNOSIS — W868XXA Exposure to other electric current, initial encounter: Secondary | ICD-10-CM | POA: Insufficient documentation

## 2012-10-12 DIAGNOSIS — S4980XA Other specified injuries of shoulder and upper arm, unspecified arm, initial encounter: Secondary | ICD-10-CM | POA: Insufficient documentation

## 2012-10-12 DIAGNOSIS — Z79899 Other long term (current) drug therapy: Secondary | ICD-10-CM | POA: Insufficient documentation

## 2012-10-12 DIAGNOSIS — D689 Coagulation defect, unspecified: Secondary | ICD-10-CM | POA: Insufficient documentation

## 2012-10-12 DIAGNOSIS — Z8669 Personal history of other diseases of the nervous system and sense organs: Secondary | ICD-10-CM | POA: Insufficient documentation

## 2012-10-12 DIAGNOSIS — H539 Unspecified visual disturbance: Secondary | ICD-10-CM | POA: Insufficient documentation

## 2012-10-12 DIAGNOSIS — I10 Essential (primary) hypertension: Secondary | ICD-10-CM | POA: Insufficient documentation

## 2012-10-12 DIAGNOSIS — R110 Nausea: Secondary | ICD-10-CM | POA: Insufficient documentation

## 2012-10-12 DIAGNOSIS — M542 Cervicalgia: Secondary | ICD-10-CM

## 2012-10-12 DIAGNOSIS — S0993XA Unspecified injury of face, initial encounter: Secondary | ICD-10-CM | POA: Insufficient documentation

## 2012-10-12 DIAGNOSIS — J3489 Other specified disorders of nose and nasal sinuses: Secondary | ICD-10-CM | POA: Insufficient documentation

## 2012-10-12 DIAGNOSIS — S46909A Unspecified injury of unspecified muscle, fascia and tendon at shoulder and upper arm level, unspecified arm, initial encounter: Secondary | ICD-10-CM | POA: Insufficient documentation

## 2012-10-12 DIAGNOSIS — Z8719 Personal history of other diseases of the digestive system: Secondary | ICD-10-CM | POA: Insufficient documentation

## 2012-10-12 DIAGNOSIS — R197 Diarrhea, unspecified: Secondary | ICD-10-CM | POA: Insufficient documentation

## 2012-10-12 DIAGNOSIS — Z8619 Personal history of other infectious and parasitic diseases: Secondary | ICD-10-CM | POA: Insufficient documentation

## 2012-10-12 DIAGNOSIS — M2569 Stiffness of other specified joint, not elsewhere classified: Secondary | ICD-10-CM | POA: Insufficient documentation

## 2012-10-12 DIAGNOSIS — Z8639 Personal history of other endocrine, nutritional and metabolic disease: Secondary | ICD-10-CM | POA: Insufficient documentation

## 2012-10-12 DIAGNOSIS — R059 Cough, unspecified: Secondary | ICD-10-CM | POA: Insufficient documentation

## 2012-10-12 DIAGNOSIS — R05 Cough: Secondary | ICD-10-CM | POA: Insufficient documentation

## 2012-10-12 DIAGNOSIS — S199XXA Unspecified injury of neck, initial encounter: Secondary | ICD-10-CM | POA: Insufficient documentation

## 2012-10-12 DIAGNOSIS — R42 Dizziness and giddiness: Secondary | ICD-10-CM | POA: Insufficient documentation

## 2012-10-12 DIAGNOSIS — M7989 Other specified soft tissue disorders: Secondary | ICD-10-CM | POA: Insufficient documentation

## 2012-10-12 DIAGNOSIS — Z862 Personal history of diseases of the blood and blood-forming organs and certain disorders involving the immune mechanism: Secondary | ICD-10-CM | POA: Insufficient documentation

## 2012-10-12 DIAGNOSIS — S0990XA Unspecified injury of head, initial encounter: Secondary | ICD-10-CM | POA: Insufficient documentation

## 2012-10-12 MED ORDER — DEXAMETHASONE SODIUM PHOSPHATE 10 MG/ML IJ SOLN
10.0000 mg | Freq: Once | INTRAMUSCULAR | Status: AC
  Administered 2012-10-12: 10 mg via INTRAVENOUS
  Filled 2012-10-12: qty 1

## 2012-10-12 MED ORDER — OXYCODONE-ACETAMINOPHEN 10-325 MG PO TABS: 1.0000 | ORAL_TABLET | ORAL | Status: DC | PRN

## 2012-10-12 MED ORDER — BUPIVACAINE HCL (PF) 0.5 % IJ SOLN
5.0000 mL | Freq: Once | INTRAMUSCULAR | Status: DC
  Filled 2012-10-12: qty 30

## 2012-10-12 MED ORDER — PROMETHAZINE HCL 25 MG/ML IJ SOLN
50.0000 mg | Freq: Once | INTRAMUSCULAR | Status: AC
Start: 2012-10-12 — End: 2012-10-12
  Administered 2012-10-12: 50 mg via INTRAMUSCULAR
  Filled 2012-10-12: qty 2

## 2012-10-12 MED ORDER — PROMETHAZINE HCL 25 MG PO TABS: 25.0000 mg | ORAL_TABLET | Freq: Three times a day (TID) | ORAL | Status: DC | PRN

## 2012-10-12 NOTE — ED Notes (Signed)
Pt complains of "sharp pain to my neck and down my left arm" Pt reports to being on the computer on Friday and had a sharp pain to the left side of his neck and down his left arm. Pt reports that the pain is "coming and going"

## 2012-10-12 NOTE — ED Notes (Signed)
Voiced understanding of instructions given 

## 2012-10-12 NOTE — ED Provider Notes (Signed)
I saw and evaluated the patient, reviewed the resident's note and I agree with the findings and plan.   .Face to face Exam:  General:  Awake HEENT:  Atraumatic Resp:  Normal effort Abd:  Nondistended Neuro:No focal weakness Lymph: No adenopathy   Nelia Shi, MD 10/12/12 2234

## 2012-10-12 NOTE — ED Provider Notes (Signed)
History     CSN: [id number]  Arrival date & time 10/12/12  0934   First MD Initiated Contact with Patient 10/12/12 [phone]      Chief Complaint  Patient presents with  . Neck Pain    HPI Pt is a 62 yo M with PMH of HTN, DVT, diverticulitis and chronic back pain who presents to ED with 2 day history of left sided neck pain. Pt states the pain started acutely 2 days ago while sitting at a computer with headphones on. The pain was an "electric shock" on the left side that radiated to his head and down his shoulder. He states since it started he has "felt bad" with headache, left shoulder pain, and now with pain going down left arm. He states he got concerned when he noticed he had weakness of his left hand while holding a cigarette this morning. He has never had anything like this before. He is followed by his neurology for lumbar back pain and has implanted nerve stimulator. He is left handed truck driver. He also endorses symptoms of diverticulitis that started last night with some nausea.  Past Medical History  Diagnosis Date  . Hypertension   . History of blood clots   . Diverticulitis   . Hernia   . Diverticulosis   . Shingles   . Gout   . Back pain   . Neuropathy, peripheral 05/15/2012    Past Surgical History  Procedure Date  . Back surgery   . Hernia repair   . Hiatal hernia repair     Family History  Problem Relation Age of Onset  . Prostate cancer Father   . Diabetes Mother   . Colon cancer Maternal Uncle   . Pancreatic cancer Paternal Uncle     History  Substance Use Topics  . Smoking status: Current Every Day Smoker -- 2.0 packs/day  . Smokeless tobacco: Current User  . Alcohol Use: Yes    Review of Systems  Constitutional: Positive for activity change. Negative for fever and chills.  HENT: Positive for congestion and neck stiffness. Negative for hearing loss and trouble swallowing.   Eyes: Positive for visual disturbance.  Respiratory: Positive for cough.  Negative for chest tightness and shortness of breath.   Cardiovascular: Positive for leg swelling. Negative for chest pain and palpitations.  Gastrointestinal: Positive for nausea, abdominal pain and diarrhea.  Genitourinary: Negative for dysuria and difficulty urinating.  Musculoskeletal: Positive for myalgias, back pain and arthralgias.  Skin: Negative for rash.  Neurological: Positive for light-headedness and headaches.  Hematological: Negative for adenopathy.    Allergies  Aspirin; Celecoxib; Ibuprofen; and Lyrica  Home Medications   Current Outpatient Rx  Name  Route  Sig  Dispense  Refill  . ALBUTEROL SULFATE HFA 108 (90 BASE) MCG/ACT IN AERS   Inhalation   Inhale 1-2 puffs into the lungs every 6 (six) hours as needed for wheezing.   1 Inhaler   0   . BENAZEPRIL HCL 40 MG PO TABS   Oral   Take 80 mg by mouth Daily.         Marland Kitchen HYDROCHLOROTHIAZIDE 25 MG PO TABS   Oral   Take 1 tablet by mouth Daily.         Marland Kitchen METHOCARBAMOL 750 MG PO TABS   Oral   Take 750 mg by mouth 3 (three) times daily as needed. MUSCLE SPASM         . METOPROLOL SUCCINATE ER 50 MG PO TB24  Oral   Take 50 mg by mouth daily. Take with or immediately following a meal.         . NIFEDICAL XL 60 MG PO TB24   Oral   Take 120 mg by mouth daily.          Marland Kitchen OMEPRAZOLE 20 MG PO CPDR   Oral   Take 1 capsule (20 mg total) by mouth daily.   30 capsule   4   . OXYCODONE HCL 15 MG PO TABS   Oral   Take 15 mg by mouth Every 6 hours.         Marland Kitchen VITAMIN D (ERGOCALCIFEROL) 50000 UNITS PO CAPS   Oral   Take 50,000 Units by mouth every 7 (seven) days. Every sunday         . VOLTAREN 1 % TD GEL   Topical   Apply 1 application topically as needed. Apply to painful areas as needed.         . WARFARIN SODIUM 5 MG PO TABS   Oral   Take 7.5-10 mg by mouth every evening. 7.5 mg daily except 10mg  on saturdays and thursdays or as directed.         . OXYCODONE-ACETAMINOPHEN 10-325 MG PO  TABS   Oral   Take 1 tablet by mouth every 4 (four) hours as needed for pain.   30 tablet   0   . PROMETHAZINE HCL 25 MG PO TABS   Oral   Take 1 tablet (25 mg total) by mouth every 8 (eight) hours as needed for nausea.   30 tablet   0     BP 169/122  Pulse 84  Temp 98.7 F (37.1 C)  Resp 18  SpO2 100%  Physical Exam  Constitutional: He is oriented to person, place, and time. He appears well-developed and well-nourished.       Appears uncomfortable  HENT:  Head: Normocephalic and atraumatic.  Eyes: Pupils are equal, round, and reactive to light.  Neck: Trachea normal. Neck supple. Spinous process tenderness and muscular tenderness (Left posterior cervical pain to light palpation) present. Carotid bruit is not present. Decreased range of motion present.  Musculoskeletal: He exhibits no edema.  Neurological: He is alert and oriented to person, place, and time. He has normal strength. No cranial nerve deficit or sensory deficit.  Skin: He is not diaphoretic.    ED Course  Injection tendon or ligament Performed by: Nelia Shi Authorized by: Nelia Shi Consent: Verbal consent obtained. Consent given by: patient Patient understanding: patient states understanding of the procedure being performed Site marked: the operative site was marked Comments: 1.5 cc of Marcaine 0.5% was injected with 22g 1.5" needle into left paracervical between C6-C7. Patient tolerated well.    1. Cervical muscle pain     MDM  62 yo M with acute onset neck pain, lasting 2 days  MRI from 2011 reviewed which showed some cervical DJD which could explain his numbness. No focal neurologic findings suggestive of TIA or stroke. Pain is most likely muscular as well as local inflammation. Offered paracervical block, which he would like to try. Tolerated procedure well.  Also given Decadron in ED for inflammation and Phenergan IM for nausea. He should follow up with GI doctor and/or PCP for  persistent diverticular pain. He has been advised to avoid triggers such as alcohol and sodas.  Will d/c home with Phenergan as well as Percocet for short term pain relief.  Hilarie Fredrickson, MD 10/12/12 1118

## 2012-10-16 ENCOUNTER — Ambulatory Visit (HOSPITAL_BASED_OUTPATIENT_CLINIC_OR_DEPARTMENT_OTHER): Payer: Medicare Other | Admitting: Pharmacist

## 2012-10-16 ENCOUNTER — Other Ambulatory Visit (HOSPITAL_BASED_OUTPATIENT_CLINIC_OR_DEPARTMENT_OTHER): Payer: Medicare Other | Admitting: Lab

## 2012-10-16 DIAGNOSIS — I82409 Acute embolism and thrombosis of unspecified deep veins of unspecified lower extremity: Secondary | ICD-10-CM

## 2012-10-16 DIAGNOSIS — I2699 Other pulmonary embolism without acute cor pulmonale: Secondary | ICD-10-CM

## 2012-10-16 LAB — PROTIME-INR
INR: 2.1 (ref 2.00–3.50)
Protime: 25.2 Seconds — ABNORMAL HIGH (ref 10.6–13.4)

## 2012-10-16 LAB — POCT INR: INR: 2.1

## 2012-10-16 NOTE — Progress Notes (Signed)
INR = 2.1 on 7.5mg  daily except 10mg  on Wed and Sat. Pt started Centrum Silver last Sat. (contains Vit K).  He will try to remain consistent w/ taking these vitamins. No complaints re: anticoag. INR at goal.  No change to dose. Return 1 month.  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 Marily Lente, Pharm.D.

## 2012-11-05 ENCOUNTER — Telehealth: Payer: Self-pay | Admitting: Oncology

## 2012-11-05 NOTE — Telephone Encounter (Signed)
lm gv appt d/t.Marland Kitchenask pt to call in confirm..made pt aware of letter/cal that i will mail...td

## 2012-11-10 ENCOUNTER — Emergency Department (HOSPITAL_COMMUNITY)
Admission: EM | Admit: 2012-11-10 | Discharge: 2012-11-10 | Disposition: A | Discharge status: Home / Self Care | Payer: Medicare Other | Attending: Emergency Medicine | Admitting: Emergency Medicine

## 2012-11-10 ENCOUNTER — Emergency Department (HOSPITAL_COMMUNITY): Payer: Medicare Other

## 2012-11-10 ENCOUNTER — Encounter: Payer: Self-pay | Admitting: Gastroenterology

## 2012-11-10 ENCOUNTER — Encounter (HOSPITAL_COMMUNITY): Payer: Self-pay | Admitting: Emergency Medicine

## 2012-11-10 DIAGNOSIS — K5732 Diverticulitis of large intestine without perforation or abscess without bleeding: Secondary | ICD-10-CM | POA: Insufficient documentation

## 2012-11-10 DIAGNOSIS — M549 Dorsalgia, unspecified: Secondary | ICD-10-CM | POA: Insufficient documentation

## 2012-11-10 DIAGNOSIS — Z8619 Personal history of other infectious and parasitic diseases: Secondary | ICD-10-CM | POA: Insufficient documentation

## 2012-11-10 DIAGNOSIS — R109 Unspecified abdominal pain: Secondary | ICD-10-CM

## 2012-11-10 DIAGNOSIS — F17200 Nicotine dependence, unspecified, uncomplicated: Secondary | ICD-10-CM | POA: Insufficient documentation

## 2012-11-10 DIAGNOSIS — Z8719 Personal history of other diseases of the digestive system: Secondary | ICD-10-CM | POA: Insufficient documentation

## 2012-11-10 DIAGNOSIS — I10 Essential (primary) hypertension: Secondary | ICD-10-CM | POA: Insufficient documentation

## 2012-11-10 DIAGNOSIS — Z86718 Personal history of other venous thrombosis and embolism: Secondary | ICD-10-CM | POA: Insufficient documentation

## 2012-11-10 DIAGNOSIS — M109 Gout, unspecified: Secondary | ICD-10-CM | POA: Insufficient documentation

## 2012-11-10 DIAGNOSIS — G609 Hereditary and idiopathic neuropathy, unspecified: Secondary | ICD-10-CM | POA: Insufficient documentation

## 2012-11-10 DIAGNOSIS — Z79899 Other long term (current) drug therapy: Secondary | ICD-10-CM | POA: Insufficient documentation

## 2012-11-10 DIAGNOSIS — K5730 Diverticulosis of large intestine without perforation or abscess without bleeding: Secondary | ICD-10-CM | POA: Insufficient documentation

## 2012-11-10 DIAGNOSIS — R1032 Left lower quadrant pain: Secondary | ICD-10-CM | POA: Insufficient documentation

## 2012-11-10 LAB — URINALYSIS, ROUTINE W REFLEX MICROSCOPIC
Bilirubin Urine: NEGATIVE
Glucose, UA: NEGATIVE mg/dL
Hgb urine dipstick: NEGATIVE
Ketones, ur: NEGATIVE mg/dL
Leukocytes, UA: NEGATIVE
Nitrite: NEGATIVE
Protein, ur: NEGATIVE mg/dL
Specific Gravity, Urine: 1.014 (ref 1.005–1.030)
Urobilinogen, UA: 0.2 mg/dL (ref 0.0–1.0)
pH: 6 (ref 5.0–8.0)

## 2012-11-10 LAB — COMPREHENSIVE METABOLIC PANEL
ALT: 19 U/L (ref 0–53)
AST: 21 U/L (ref 0–37)
Albumin: 4.2 g/dL (ref 3.5–5.2)
Alkaline Phosphatase: 59 U/L (ref 39–117)
BUN: 8 mg/dL (ref 6–23)
CO2: 28 mEq/L (ref 19–32)
Calcium: 9.5 mg/dL (ref 8.4–10.5)
Chloride: 102 mEq/L (ref 96–112)
Creatinine, Ser: 1.05 mg/dL (ref 0.50–1.35)
GFR calc Af Amer: 86 mL/min — ABNORMAL LOW (ref >90)
GFR calc non Af Amer: 74 mL/min — ABNORMAL LOW (ref >90)
Glucose, Bld: 93 mg/dL (ref 70–99)
Potassium: 3.6 mEq/L (ref 3.5–5.1)
Sodium: 141 mEq/L (ref 135–145)
Total Bilirubin: 0.7 mg/dL (ref 0.3–1.2)
Total Protein: 7.7 g/dL (ref 6.0–8.3)

## 2012-11-10 LAB — CBC
HCT: 42.7 % (ref 39.0–52.0)
Hemoglobin: 14.2 g/dL (ref 13.0–17.0)
MCH: 23.2 pg — ABNORMAL LOW (ref 26.0–34.0)
MCHC: 33.3 g/dL (ref 30.0–36.0)
MCV: 69.7 fL — ABNORMAL LOW (ref 78.0–100.0)
Platelets: 276 10*3/uL (ref 150–400)
RBC: 6.13 MIL/uL — ABNORMAL HIGH (ref 4.22–5.81)
RDW: 17.4 % — ABNORMAL HIGH (ref 11.5–15.5)
WBC: 4.7 10*3/uL (ref 4.0–10.5)

## 2012-11-10 LAB — LIPASE, BLOOD: Lipase: 19 U/L (ref 11–59)

## 2012-11-10 MED ORDER — IOHEXOL 300 MG/ML  SOLN
100.0000 mL | Freq: Once | INTRAMUSCULAR | Status: AC | PRN
  Administered 2012-11-10: 100 mL via INTRAVENOUS

## 2012-11-10 MED ORDER — OXYCODONE-ACETAMINOPHEN 5-325 MG PO TABS: 1.0000 | ORAL_TABLET | ORAL | Status: DC | PRN

## 2012-11-10 MED ORDER — IOHEXOL 300 MG/ML  SOLN
50.0000 mL | Freq: Once | INTRAMUSCULAR | Status: AC | PRN
  Administered 2012-11-10: 50 mL via ORAL

## 2012-11-10 MED ORDER — HYDROMORPHONE HCL PF 1 MG/ML IJ SOLN
1.0000 mg | Freq: Once | INTRAMUSCULAR | Status: AC
  Administered 2012-11-10: 1 mg via INTRAVENOUS
  Filled 2012-11-10: qty 1

## 2012-11-10 MED ORDER — HYDROMORPHONE HCL PF 1 MG/ML IJ SOLN
1.0000 mg | INTRAMUSCULAR | Status: DC | PRN
  Administered 2012-11-10: 1 mg via INTRAVENOUS
  Filled 2012-11-10: qty 1

## 2012-11-10 MED ORDER — ONDANSETRON HCL 4 MG/2ML IJ SOLN
4.0000 mg | Freq: Once | INTRAMUSCULAR | Status: AC
  Administered 2012-11-10: 4 mg via INTRAVENOUS
  Filled 2012-11-10: qty 2

## 2012-11-10 NOTE — ED Provider Notes (Signed)
History     CSN: [id number]  Arrival date & time 11/10/12  1106   First MD Initiated Contact with Patient 11/10/12 1124      Chief Complaint  Patient presents with  . Abdominal Pain    (Consider location/radiation/quality/duration/timing/severity/associated sxs/prior treatment) HPI Pt presenting with c/o left lower abdominal pain.  Pt states he has had similar pain in the past with his diverticulitis.  Pain is constant and cramping and sharp.  No fever, no vomiting.  Has had loose stools intermittently- but no blood or melena.  No pain with urination or blood in urine.  Pain is described as moderate.  There are no other associated systemic symptoms, there are no other alleviating or modifying factors.   Past Medical History  Diagnosis Date  . Hypertension   . History of blood clots   . Diverticulitis   . Hernia   . Diverticulosis   . Shingles   . Gout   . Back pain   . Neuropathy, peripheral 05/15/2012    Past Surgical History  Procedure Laterality Date  . Back surgery    . Hernia repair    . Hiatal hernia repair      Family History  Problem Relation Age of Onset  . Prostate cancer Father   . Diabetes Mother   . Colon cancer Maternal Uncle   . Pancreatic cancer Paternal Uncle     History  Substance Use Topics  . Smoking status: Current Every Day Smoker -- 2.00 packs/day  . Smokeless tobacco: Current User  . Alcohol Use: Yes      Review of Systems ROS reviewed and all otherwise negative except for mentioned in HPI  Allergies  Aspirin; Celecoxib; Ibuprofen; and Lyrica  Home Medications   Current Outpatient Rx  Name  Route  Sig  Dispense  Refill  . albuterol (PROVENTIL HFA;VENTOLIN HFA) 108 (90 BASE) MCG/ACT inhaler   Inhalation   Inhale 1-2 puffs into the lungs every 6 (six) hours as needed for wheezing.   1 Inhaler   0   . benazepril (LOTENSIN) 40 MG tablet   Oral   Take 80 mg by mouth Daily.         . hydrochlorothiazide (HYDRODIURIL) 25 MG  tablet   Oral   Take 1 tablet by mouth Daily.         . methocarbamol (ROBAXIN) 750 MG tablet   Oral   Take 750 mg by mouth 3 (three) times daily as needed. MUSCLE SPASM         . metoprolol succinate (TOPROL-XL) 50 MG 24 hr tablet   Oral   Take 50 mg by mouth daily. Take with or immediately following a meal.         . NIFEDICAL XL 60 MG 24 hr tablet   Oral   Take 120 mg by mouth daily.          Marland Kitchen omeprazole (PRILOSEC) 20 MG capsule   Oral   Take 1 capsule (20 mg total) by mouth daily.   30 capsule   4   . oxyCODONE (ROXICODONE) 15 MG immediate release tablet   Oral   Take 15 mg by mouth Every 6 hours.         Marland Kitchen oxyCODONE-acetaminophen (PERCOCET) 10-325 MG per tablet   Oral   Take 1 tablet by mouth every 4 (four) hours as needed for pain.   30 tablet   0   . promethazine (PHENERGAN) 25 MG tablet   Oral  Take 1 tablet (25 mg total) by mouth every 8 (eight) hours as needed for nausea.   30 tablet   0   . VOLTAREN 1 % GEL   Topical   Apply 1 application topically as needed. Apply to painful areas as needed.         . warfarin (COUMADIN) 5 MG tablet   Oral   Take 7.5-10 mg by mouth every evening. 7.5 mg daily except 10mg  on saturdays and thursdays or as directed.         Marland Kitchen oxyCODONE-acetaminophen (ROXICET) 5-325 MG per tablet   Oral   Take 1 tablet by mouth every 4 (four) hours as needed for pain.   30 tablet   0     BP 174/108  Pulse 72  Temp(Src) 98.3 F (36.8 C) (Oral)  Resp 16  SpO2 99% Vitals reviewed Physical Exam Physical Examination: General appearance - alert, well appearing, and in no distress Mental status - alert, oriented to person, place, and time Eyes - no scleral icterus, no conjunctival injection Mouth - mucous membranes moist, pharynx normal without lesions Chest - clear to auscultation, no wheezes, rales or rhonchi, symmetric air entry Heart - normal rate, regular rhythm, normal S1, S2, no murmurs, rubs, clicks or  gallops Abdomen - soft, ttp in left lower abdomen, no gaurding or rebound tenderness, nondistended, no masses or organomegaly Extremities - peripheral pulses normal, no pedal edema, no clubbing or cyanosis Skin - normal coloration and turgor, no rashes  ED Course  Procedures (including critical care time)  Labs Reviewed  CBC - Abnormal; Notable for the following:    RBC 6.13 (*)    MCV 69.7 (*)    MCH 23.2 (*)    RDW 17.4 (*)    All other components within normal limits  COMPREHENSIVE METABOLIC PANEL - Abnormal; Notable for the following:    GFR calc non Af Amer 74 (*)    GFR calc Af Amer 86 (*)    All other components within normal limits  URINALYSIS, ROUTINE W REFLEX MICROSCOPIC  LIPASE, BLOOD   No results found.   1. Abdominal pain       MDM  Pt presents with c/o lower left sided abdominal pain similar to his prior episode of diverticulitis.  CT scan and labs are are reassuring.  Pt advised to arrange for f/u on an outpatient basis with both PMD and GI.  Discharged with strict return precautions.  Pt agreeable with plan.        Ethelda Chick, MD 11/17/12 [phone]

## 2012-11-10 NOTE — ED Notes (Signed)
Pt escorted to discharge window. Pt verbalized understanding discharge instructions. In no acute distress.  

## 2012-11-10 NOTE — ED Notes (Signed)
Pt has a dx of diverticulitis and states that for approx 2 weeks his abd hurts and that the has n/d at times, llq abd pain.

## 2012-11-10 NOTE — ED Notes (Signed)
Pt to xray

## 2012-11-10 NOTE — ED Notes (Signed)
Pt alert and oriented x4. Respirations even and unlabored, bilateral symmetrical rise and fall of chest. Skin warm and dry. In no acute distress. Denies needs.   

## 2012-11-10 NOTE — ED Notes (Signed)
md at bedside

## 2012-11-12 ENCOUNTER — Other Ambulatory Visit: Payer: Self-pay | Admitting: *Deleted

## 2012-11-12 DIAGNOSIS — Z7901 Long term (current) use of anticoagulants: Secondary | ICD-10-CM

## 2012-11-13 ENCOUNTER — Ambulatory Visit: Payer: Medicare Other

## 2012-11-13 ENCOUNTER — Other Ambulatory Visit: Payer: Medicare Other

## 2012-11-18 ENCOUNTER — Ambulatory Visit: Payer: Medicare Other | Admitting: Pharmacist

## 2012-11-18 ENCOUNTER — Other Ambulatory Visit (HOSPITAL_BASED_OUTPATIENT_CLINIC_OR_DEPARTMENT_OTHER): Payer: Medicare Other | Admitting: Lab

## 2012-11-18 DIAGNOSIS — Z7901 Long term (current) use of anticoagulants: Secondary | ICD-10-CM

## 2012-11-18 DIAGNOSIS — I2699 Other pulmonary embolism without acute cor pulmonale: Secondary | ICD-10-CM

## 2012-11-18 LAB — PROTIME-INR
INR: 1.2 — ABNORMAL LOW (ref 2.00–3.50)
Protime: 14.4 Seconds — ABNORMAL HIGH (ref 10.6–13.4)

## 2012-11-18 LAB — POCT INR: INR: 1.2

## 2012-11-18 NOTE — Progress Notes (Signed)
INR = 1.2 Pt reports he takes Coumadin 7.5 mg daily except 10 mg Wed/Sat.  He may have taken his doses later than usual in the day but he has not missed any Coumadin doses. He is under a lot of stress.  Two weeks ago his daughter collapsed & she is in the hospital w/ her MS.  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 He has been to the ED (last on 11/10/12) w/ diverticulitis & abdominal pain.  He also c/o L sided groin pain.   He is in obvious pain today. Mr Vincelette has tried to get in with his PCP but they will not see him due to insurance not paying.  He is scheduled to see Dr. Arlyce Dice on 3/25 re: diverticulitis. He recently has used marijuana once due to the increased pain. No medication changes. INR subtherapeutic.  He will take 10 mg x 3 days then take 7.5 mg this Saturday. Next week pt will take his usual dose of Coumadin since he has been therapeutic prior to today on this dose. Return in 1 week for protime. Ebony Hail, Pharm.D., CPP 11/18/2012@11 :46 AM

## 2012-11-26 ENCOUNTER — Ambulatory Visit: Payer: Medicare Other

## 2012-11-26 ENCOUNTER — Other Ambulatory Visit: Payer: Self-pay | Admitting: *Deleted

## 2012-11-26 ENCOUNTER — Other Ambulatory Visit: Payer: Medicare Other

## 2012-11-26 DIAGNOSIS — I2699 Other pulmonary embolism without acute cor pulmonale: Secondary | ICD-10-CM

## 2012-11-27 ENCOUNTER — Ambulatory Visit: Payer: Medicare Other | Admitting: Gastroenterology

## 2012-11-30 ENCOUNTER — Telehealth: Payer: Self-pay | Admitting: Pharmacist

## 2012-11-30 NOTE — Telephone Encounter (Signed)
Left VM and asked pt to reschedule missed lab/Coumadin clinic apt from 11/26/12. Tried to call mobile number but that number was not able to accept calls at the time of my call. Asked pt to call [phone] to reschedule lab/clinic apt.

## 2012-12-15 ENCOUNTER — Telehealth: Payer: Self-pay | Admitting: Pharmacist

## 2012-12-15 NOTE — Telephone Encounter (Signed)
Pt called to let us know that he was out of town and would not be returning anytime soon.  One of his daughter was recently hospitalized. He is helping take care of her until she is placed in a rehabilitation place. He called to let us know that he wanted to get his INR checked this week in [facility], [place] before 12/18/12. He plans to either have it checked at his daughter's MD office or an urgent care office or ED. I gave him our office fax [phone]) and office phone numbers [phone]) in case he needs anything else from [place]. He can be reached at 2 different phone numbers while he is out of town: [phone] or [phone].  He is taking 7.5mg  daily except 10mg  on Wed&Sat. He is not having any problems regarding anticoagulation. Will f/u for INR before 12/18/12.

## 2012-12-17 ENCOUNTER — Telehealth: Payer: Self-pay | Admitting: Pharmacist

## 2012-12-21 ENCOUNTER — Ambulatory Visit: Payer: Self-pay | Admitting: Pharmacist

## 2012-12-21 DIAGNOSIS — I2699 Other pulmonary embolism without acute cor pulmonale: Secondary | ICD-10-CM

## 2012-12-21 LAB — POCT INR: INR: 1.6

## 2012-12-21 NOTE — Patient Instructions (Addendum)
Take coumadin 7.5 mg/day except 10 mg Mon, Wed & Fri. Return 12/30/12 at 10:30 am for lab; 10:45 am for Coumadin clinic

## 2012-12-21 NOTE — Progress Notes (Signed)
Talked with patient via telephone at [phone]. He had his INR done at [facility] as he is with his daughter. We will increase his dose to 10mg  MWF and 7.5 mg other days He has not changes to report. RTC on 12/30/12 at 10:30 for lab and cc at 10:45 He is instructed to call us if he will be unable to make this appmt and we need to send another order for him to have his INR done elsewhere.

## 2012-12-30 ENCOUNTER — Other Ambulatory Visit: Payer: Medicare Other | Admitting: Lab

## 2012-12-30 ENCOUNTER — Ambulatory Visit: Payer: Medicare Other

## 2013-01-11 ENCOUNTER — Encounter: Payer: Self-pay | Admitting: Pharmacist

## 2013-01-11 NOTE — Progress Notes (Unsigned)
I s/w pt over phone today.  He is still w/ his daughter in [place]. He stated he had his INR checked in [place] last week on 01/04/13 & INR = 1.4 We were not faxed these results.   Pt is taking Coumadin 7.5 mg/day; 10 mg Tu/Thurs. I instructed pt to take Coumadin 10 mg today & go have INR checked tomorrow at [facility]. We will f/u w/ pt once results made available to [place]. Ebony Hail, Pharm.D., CPP 01/11/2013@12 :11 PM

## 2013-01-18 ENCOUNTER — Ambulatory Visit: Payer: Medicare Other

## 2013-01-18 ENCOUNTER — Ambulatory Visit: Payer: Medicare Other | Admitting: Lab

## 2013-01-19 ENCOUNTER — Ambulatory Visit: Payer: Medicare Other | Admitting: Lab

## 2013-01-19 ENCOUNTER — Telehealth: Payer: Self-pay | Admitting: Pharmacist

## 2013-01-19 ENCOUNTER — Ambulatory Visit: Payer: Medicare Other

## 2013-01-19 LAB — POCT INR: INR: 2.4

## 2013-01-20 ENCOUNTER — Telehealth: Payer: Self-pay | Admitting: Pharmacist

## 2013-01-20 ENCOUNTER — Encounter: Payer: Self-pay | Admitting: *Deleted

## 2013-01-20 ENCOUNTER — Ambulatory Visit: Payer: Self-pay | Admitting: Pharmacist

## 2013-01-20 DIAGNOSIS — I2699 Other pulmonary embolism without acute cor pulmonale: Secondary | ICD-10-CM

## 2013-01-20 DIAGNOSIS — I82409 Acute embolism and thrombosis of unspecified deep veins of unspecified lower extremity: Secondary | ICD-10-CM

## 2013-01-20 NOTE — Patient Instructions (Addendum)
Flagyl is known to interact with coumadin and can greatly increase your INR/bleeding risk. Cipro can also increase INR. You are currently taking coumadin 7.5mg  daily except 10mg  on MWF. Decrease coumadin to 7.5mg  daily while taking both Cipro and Flagyl. Recheck INR in 5 days on 01/25/13; lab at 11am and coumadin clinic at 11:15am since you are on Cipro/Flagyl for 10 days.

## 2013-01-20 NOTE — Telephone Encounter (Signed)
This encounter was created in error - please disregard.

## 2013-01-20 NOTE — Progress Notes (Signed)
Spoke to pt by phone regarding INR results from Dr. Judie Petit. [name] office on 01/19/13 at [facility]. INR = 2.4 - finally therapeutic. Pt has been taking Cipro 500mg  BID for the last 2+ weeks. It has not helped his diverticulitis flare. He was evaluated by Dr. Mikeal Hawthorne on 5/13. Cipro was continued for an additional 10 days. Pt was also started on Flagyl x 10 days starting 01/19/13. No problems to report regarding anticoagulation. Continued painful area under his right arm from shingles. Pt has pain medications. Metoprolol was increased to 100mg  po BID. No other changes in diet, medications, etc. Flagyl is known to interact with coumadin and can greatly increase INR/bleeding risk. Cipro can also increase INR. Pt is currently taking coumadin 7.5mg  daily except 10mg  on MWF. Decrease coumadin to 7.5mg  daily while taking both Cipro and Flagyl. Recheck INR in 5 days on 01/25/13; lab at 11am and coumadin clinic at 11:15am. Discussed importance of these drug interactions with coumadin and stressed the importance of keeping his appointment in 5 days. Pt stated understanding. Pt is heading out of town on 5/19 to help his daughter. Will evaluate INR prior to leaving town. I was able to speak to patient by phone at [phone]

## 2013-01-25 ENCOUNTER — Ambulatory Visit: Payer: Medicare Other | Admitting: Pharmacist

## 2013-01-25 ENCOUNTER — Other Ambulatory Visit (HOSPITAL_BASED_OUTPATIENT_CLINIC_OR_DEPARTMENT_OTHER): Payer: Medicare Other | Admitting: Lab

## 2013-01-25 DIAGNOSIS — I2699 Other pulmonary embolism without acute cor pulmonale: Secondary | ICD-10-CM

## 2013-01-25 LAB — PROTIME-INR
INR: 3.2 (ref 2.00–3.50)
Protime: 38.4 Seconds — ABNORMAL HIGH (ref 10.6–13.4)

## 2013-01-25 LAB — POCT INR: INR: 3.2

## 2013-01-25 NOTE — Progress Notes (Signed)
Pt seen in clinic today INR=3.2 on 7.5 mg daily Pt still on Flagyl for 3 days.  Cipro ended yesterday.  Pt states he is still having symptoms and issues with diverticulitis flares. Will not change dose with cipro finished and 3 days with flagyl.  He was given a RX to have his INR/PT done at [facility] again next Tue (02/02/13). They are to fax results to pharmacy. Gave patient #[id number] Coumadin 7.5 mg Exp: 5/15 Lot: [id number] Pt is on our NO CHARGE list

## 2013-01-25 NOTE — Patient Instructions (Addendum)
Continue coumadin 7.5mg  daily. Recheck INR in 7 days on 02/02/13 at [facility].

## 2013-02-03 ENCOUNTER — Ambulatory Visit: Payer: Self-pay | Admitting: Pharmacist

## 2013-02-03 DIAGNOSIS — I2699 Other pulmonary embolism without acute cor pulmonale: Secondary | ICD-10-CM

## 2013-02-03 LAB — POCT INR: INR: 1.7

## 2013-02-03 NOTE — Progress Notes (Signed)
INR = 1.7 (drawn at [place]) Pt has been taking Coumadin 7.5 mg/day, dose decreased since he was on abx. Pt now off abx. He will stay at his daughter's house for a while longer.  As long as he is there he'll continue to get labs up there.  [facility] (ph# [phone]; fax# [phone]) INR a little low.  Increase back to previous dose of 7.5 mg/day; 10 mg Wed/Fri. I faxed PRN lab order for INR to [facility] today. Pt will go there 02/16/13 for next protime. This was a phone encounter-- NO CHARGE Ebony Hail, Pharm.D., CPP 02/03/2013@4 :04 PM

## 2013-02-17 ENCOUNTER — Telehealth: Payer: Self-pay | Admitting: Pharmacist

## 2013-02-17 LAB — POCT INR: INR: 1.4

## 2013-02-17 NOTE — Telephone Encounter (Signed)
Received INR results from today at [facility] [place]. INR results = 1.4 Left message for pt to call us back to discuss. Please call [phone]

## 2013-02-18 ENCOUNTER — Ambulatory Visit (HOSPITAL_BASED_OUTPATIENT_CLINIC_OR_DEPARTMENT_OTHER): Payer: Medicare Other | Admitting: Pharmacist

## 2013-02-18 DIAGNOSIS — I82409 Acute embolism and thrombosis of unspecified deep veins of unspecified lower extremity: Secondary | ICD-10-CM

## 2013-02-18 DIAGNOSIS — I2699 Other pulmonary embolism without acute cor pulmonale: Secondary | ICD-10-CM

## 2013-02-18 LAB — POCT INR: INR: 1.4

## 2013-02-18 NOTE — Patient Instructions (Addendum)
Increase coumadin to 7.5mg  daily except 10mg  on TuThuSat.  Recheck INR in 2 weeks on 03/04/13: lab at 11:30am and coumadin clinic at 11:45am.

## 2013-02-18 NOTE — Progress Notes (Signed)
INR below goal today. Pt forgot to increase his coumadin to 7.5mg  daily except 10mg  on Wed&Fri as discussed on 02/03/13. No s/s of clotting. No problems regarding anticoagulation. Pt does have one bruise on his thigh from an object that flew up and hit him while he was mowing. It is healing slowly. Pt is off all antibiotics. He took his last dose of Cipro on ~02/08/13. No other changes in medications. No missed doses. Pt may be eating more vegetables lately. Increase coumadin to 7.5mg  daily except 10mg  on TuThuSat. Pt has been therapeutic on this dose previously. Recheck INR in 2 weeks on 03/04/13: lab at 11:30am and coumadin clinic at 11:45am. Pt will go to [facility] [place] on that date if he is not going to be in town on that date. This encounter was a telephone encounter - no charge

## 2013-03-04 ENCOUNTER — Ambulatory Visit: Payer: Medicare Other

## 2013-03-04 ENCOUNTER — Ambulatory Visit: Payer: Medicare Other | Admitting: Lab

## 2013-03-04 ENCOUNTER — Telehealth: Payer: Self-pay | Admitting: Pharmacist

## 2013-03-08 ENCOUNTER — Ambulatory Visit (HOSPITAL_BASED_OUTPATIENT_CLINIC_OR_DEPARTMENT_OTHER): Payer: Self-pay | Admitting: Pharmacist

## 2013-03-08 DIAGNOSIS — I2699 Other pulmonary embolism without acute cor pulmonale: Secondary | ICD-10-CM

## 2013-03-08 LAB — POCT INR: INR: 5.5

## 2013-03-08 NOTE — Patient Instructions (Signed)
Hold coumadin today (6/30) and tomorrow (7/1).  Take coumadin 10mg  on 03/10/13.  Recheck INR on 03/11/13 at [facility]. Please have your INR checked early in the morning on 03/11/13.

## 2013-03-08 NOTE — Progress Notes (Signed)
INR above goal today. Pt took coumadin as instructed; 7.5mg  daily except 10mg  on TuThuSat. Pt forgot to call us when he started 2 antibiotics for diverticulitis. He started Cipro/Flagyl on 6/21 x 10 days. He will take his last dose of Flagyl on 03/09/13. Cipro will d/c today and be changed to Doxycycline x 10 days for left testicle pain. No problems to report regarding anticoagulation. No unusual bleeding or bruising. Pt has had some minor sinus headaches lately. They are relieved by pain medication. Pt also feels he may have had a shingles flare recently. The areas are itchy and drying up. Will hold coumadin today (6/30) and tomorrow (7/1).  Take coumadin 10mg  on 03/10/13.  Recheck INR on 03/11/13 at [facility] Pt will go first thing in the morning to have his INR checked on 03/11/13 to evaluate INR before holiday weekend. Spoke with pt by phone (Phone: [phone]). This is a no charge encounter.

## 2013-03-11 ENCOUNTER — Ambulatory Visit: Payer: Self-pay | Admitting: Pharmacist

## 2013-03-11 DIAGNOSIS — I2699 Other pulmonary embolism without acute cor pulmonale: Secondary | ICD-10-CM

## 2013-03-11 LAB — POCT INR: INR: 1.6

## 2013-03-11 NOTE — Progress Notes (Signed)
INR = 1.6 (drawn today at [facility] in [place], [place]) Pt held Coumadin 6/30 & 7/1 & took 10 mg yesterday due to elevated INR earlier this week. He is done w/ Cipro & Flagyl.  He is now on Doxycycline x 10 days.  There still is potential that Doxycycline may cause INR to increase but nothing like Flagyl's interaction. He will take Coumadin 7.5 mg/day; 10 mg M/W/F. Recheck INR 03/18/13 in [place]. No Charge- Phone encounter. Ebony Hail, Pharm.D., CPP 03/11/2013@4 :09 PM

## 2013-03-18 LAB — PROTIME-INR: INR: 1.7 — AB (ref 0.9–1.1)

## 2013-03-18 LAB — POCT INR: INR: 1.7

## 2013-03-18 NOTE — Telephone Encounter (Signed)
See anti-coag visit for 01/20/13

## 2013-03-19 ENCOUNTER — Ambulatory Visit: Payer: Self-pay | Admitting: Pharmacist

## 2013-03-19 DIAGNOSIS — I2699 Other pulmonary embolism without acute cor pulmonale: Secondary | ICD-10-CM

## 2013-03-19 NOTE — Patient Instructions (Addendum)
You have been taking 7.5mg  daily except 10mg  on MonThuSat. Slightly increase coumadin to 10mg  daily except 7.5mg  on SuTuWed.  Recheck INR on 03/25/13 at [facility].

## 2013-03-19 NOTE — Progress Notes (Signed)
INR from 03/18/13 at [facility]. INR slightly below goal today. No problems to report regarding anticoagulation. No s/s of clotting. Pt to see Dr. Cinda Quest (urologist) for genitourinary symptoms on 04/09/13. Pt has been taking coumadin 7.5mg  daily except 10mg  on MonThuSat. He completed his course of doxycycline on 03/16/13. No other medication changes. He is cutting back on the amount of vegetables that he is eating. No missed doses of coumadin. Will slightly increase coumadin to 10mg  daily except 7.5mg  on SuTuWed.  Recheck INR on 03/25/13 at [facility]. No charge visit- spoke with patient by telephone =  [phone].

## 2013-03-26 ENCOUNTER — Telehealth: Payer: Self-pay | Admitting: Pharmacist

## 2013-03-26 NOTE — Telephone Encounter (Signed)
Called patient today as we had not received a fax with his INR results. Patient stated he did not go to get his reading done this week due to transportation issues. He stated he will go to [place] [facility] on Monday Morning. Patient states he is doing well on coumadin but has been feeling fatigued, tired, and down and is ready to get back to [place] to see the doctor. When we [facility] the [facility] [place], [place], we will call the patient to discuss the results next week.  Thanks, Christell Faith, PharmD

## 2013-03-29 LAB — POCT INR: INR: 3.8

## 2013-03-30 ENCOUNTER — Telehealth: Payer: Self-pay | Admitting: Pharmacist

## 2013-03-30 ENCOUNTER — Ambulatory Visit: Payer: Self-pay | Admitting: Pharmacist

## 2013-03-30 DIAGNOSIS — I2699 Other pulmonary embolism without acute cor pulmonale: Secondary | ICD-10-CM

## 2013-03-30 NOTE — Progress Notes (Signed)
INR = 3.8 - drawn yesterday (7/21) at [name] Pt states he is on 10 mg daily except 7.5 mg Sun/Tu/Wed. I s/w pt over phone. Only c/o HA "all night."  He has not taken anything for the HA. Pt is off abx.  He has not recently used marijuana. He will be back in town to see Dr. Lindley Magnus (urologist) on 04/09/13. INR above goal but he has been low x 2 INR checks recently despite dose changes. I will not have him hold any doses, rather decrease his dose today & tomorrow to 5 mg.  After this he will resume 10 mg daily except 7.5 mg Sun/Tu/Wed. Return here 04/09/13 (10 days) after his visit w/ Dr. Lindley Magnus.  This way if med changes are made, we will be able to find out easier. NO CHARGE- phone encounter. Ebony Hail, Pharm.D., CPP 03/30/2013@9 :18 AM

## 2013-04-09 ENCOUNTER — Telehealth: Payer: Self-pay | Admitting: Pharmacist

## 2013-04-09 ENCOUNTER — Ambulatory Visit: Payer: Medicare Other

## 2013-04-09 ENCOUNTER — Other Ambulatory Visit: Payer: Medicare Other | Admitting: Lab

## 2013-04-09 NOTE — Telephone Encounter (Signed)
Left VM for pt to call and reschedule 04/09/13 lab/CC appointments. Please call [phone].

## 2013-04-16 ENCOUNTER — Ambulatory Visit: Payer: Medicare Other

## 2013-04-16 ENCOUNTER — Other Ambulatory Visit: Payer: Medicare Other | Admitting: Lab

## 2013-04-19 ENCOUNTER — Other Ambulatory Visit (HOSPITAL_BASED_OUTPATIENT_CLINIC_OR_DEPARTMENT_OTHER): Payer: Medicare Other | Admitting: Lab

## 2013-04-19 ENCOUNTER — Ambulatory Visit (HOSPITAL_BASED_OUTPATIENT_CLINIC_OR_DEPARTMENT_OTHER): Payer: Medicare Other | Admitting: Pharmacist

## 2013-04-19 DIAGNOSIS — I2699 Other pulmonary embolism without acute cor pulmonale: Secondary | ICD-10-CM

## 2013-04-19 LAB — PROTIME-INR
INR: 1.4 — ABNORMAL LOW (ref 2.00–3.50)
Protime: 16.8 Seconds — ABNORMAL HIGH (ref 10.6–13.4)

## 2013-04-19 LAB — POCT INR: INR: 1.4

## 2013-04-19 NOTE — Progress Notes (Signed)
INR = 1.4 He has taken Coumadin 7.5 mg daily; 10 mg Tu/Th/Fri.  This is not what I had last instructed him to take.  I had asked him to take 10mg  daily except 7.5mg  on SuTuWed. He has been staying at his daughter's house for a length of time now.  He is trying to get her into a rehab center.  She has MS.  He said today that as soon as his fiance' is "done" w/ her MD's in [place], they are going to "move [place]" to be closer to his daughter. His medications were reviewed today.  He is off abx.  No med changes recently. He ate 1 serving of turnip greens last night. INR subtherapeutic.  Pt deviated from recommended dose.  I will have him take 10 mg/day this week until this Saturday & he will then take 10 mg/day except 7.5 mg on Sat/Sun/Monday. I provided samples: Coumadin 5 mg x 40 tabs; lot # [id number]; exp. 10/2014 Return to have his lab at [facility] on 8/21 (10 days from now).  We'll communicate w/ him over phone w/ results. Ebony Hail, Pharm.D., CPP 04/19/2013@11 :57 AM

## 2013-05-04 ENCOUNTER — Telehealth: Payer: Self-pay | Admitting: Pharmacist

## 2013-05-04 NOTE — Telephone Encounter (Signed)
Spoke to male at home phone number and she stated that Mr Muff could not be reached at this time.  I asked that Mr Betker call coumadin clinic/get INR checked.

## 2013-05-13 ENCOUNTER — Ambulatory Visit (HOSPITAL_BASED_OUTPATIENT_CLINIC_OR_DEPARTMENT_OTHER): Payer: Medicare Other | Admitting: Pharmacist

## 2013-05-13 DIAGNOSIS — I2699 Other pulmonary embolism without acute cor pulmonale: Secondary | ICD-10-CM

## 2013-05-13 LAB — POCT INR: INR: 2.1

## 2013-05-13 NOTE — Progress Notes (Signed)
INR within goal today. Pt reports taking coumadin 10mg  daily the week of 04/19/13. Then he switched to taking 7.5mg  daily for the last ~ 3 weeks (not the 10mg  daily except 7.5mg  on Sat/Sun/Mon as documented). About 1 week ago, pt cut his finger (pinky) on his car and it took "a long time" to stop bleeding. Cut is now healed. No s/s of clotting.  No unusual bruising. No missed doses of coumadin. Pt stated that he has cut back on his salads. He has not really had any greens. Pt main complaint is the pain from diverticulitis. Pt is now on Cymbalta. Cymbalta may increase his INR, enhance the anticoagulant effect of warfarin. Take coumadin 7.5mg  daily except 10mg  on Thursdays. Recheck INR in 3 weeks on 06/03/13 at [facility]. Pt will call us if he will be in [place] and is able to come to [facility] for his INR check. Spoke to patient by phone = no charge encounter.

## 2013-05-13 NOTE — Patient Instructions (Signed)
Take coumadin 7.5mg  daily except 10mg  on Thursdays. Recheck INR in 3 weeks on 06/03/13 at [facility].  Call us if you will be in [place] and are able to come to [facility] for your INR check. Reminder: You have lab and Dr. Cyndie Chime appointments on 05/18/13.

## 2013-05-14 ENCOUNTER — Ambulatory Visit: Payer: Medicare Other | Admitting: Oncology

## 2013-05-14 ENCOUNTER — Other Ambulatory Visit: Payer: Medicare Other | Admitting: Lab

## 2013-05-18 ENCOUNTER — Ambulatory Visit: Payer: Self-pay | Admitting: Oncology

## 2013-05-18 ENCOUNTER — Other Ambulatory Visit: Payer: Self-pay | Admitting: Lab

## 2013-05-21 ENCOUNTER — Telehealth: Payer: Self-pay | Admitting: *Deleted

## 2013-05-21 NOTE — Telephone Encounter (Signed)
Received vm call from pt stating that he would be unable to make appt 05/25/13 with Dr Cyndie Chime.  Returned call this pm & pt's appt was 05/18/13.  He thought it was for the 16th.  He reports that he is out of town taking care of a daughter with MS.  He would like to r/s this appt.

## 2013-06-04 ENCOUNTER — Telehealth: Payer: Self-pay | Admitting: Pharmacist

## 2013-06-04 NOTE — Telephone Encounter (Signed)
Spoke to male at home phone number and she stated that Mr Klausing could not be reached at this time. I asked that Mr Dileonardo call coumadin clinic/get INR checked.  Christell Faith, PharmD

## 2013-06-13 ENCOUNTER — Other Ambulatory Visit: Payer: Self-pay | Admitting: Oncology

## 2013-06-13 DIAGNOSIS — Z7901 Long term (current) use of anticoagulants: Secondary | ICD-10-CM

## 2013-06-13 DIAGNOSIS — I82403 Acute embolism and thrombosis of unspecified deep veins of lower extremity, bilateral: Secondary | ICD-10-CM

## 2013-06-13 DIAGNOSIS — I2699 Other pulmonary embolism without acute cor pulmonale: Secondary | ICD-10-CM

## 2013-06-16 ENCOUNTER — Telehealth: Payer: Self-pay | Admitting: Oncology

## 2013-06-16 NOTE — Telephone Encounter (Signed)
, °

## 2013-06-22 ENCOUNTER — Telehealth: Payer: Self-pay | Admitting: Pharmacist

## 2013-06-22 NOTE — Telephone Encounter (Signed)
Pt called in CC to check on his status.  He had not rec'd any messages we had left with his wife.  He states he had INR done on 9/29 at [facility] and it was 2.3.  We have no documentation of this. He stated he was headed to ER in [facility] (where he has been before) but did not remember name.  He will call us when he gets to ER for [place] to fax order.  I relayed message that his missed appmt with Dr. Reece Agar was r/s for March 2015.  He would prefer an appmt sooner than this.

## 2013-06-23 ENCOUNTER — Encounter: Payer: Self-pay | Admitting: Pharmacist

## 2013-06-23 NOTE — Progress Notes (Signed)
Spoke with patient over phone at his new primary number [phone] at his daughters He claims his last INR was done on 9/29 was 2.3 (we do not have documentation of this result) I am sending him an RX to get his next INR done around 10/29 I have enclosed another RX for his next INR and he knows that will be used for a future INR  The RX was sent to [address], [place], [place] [address] (address given by patient)  He is still at his daughters and hoping to get employed in the [place], [place]

## 2013-06-25 ENCOUNTER — Encounter: Payer: Self-pay | Admitting: Pharmacist

## 2013-06-25 NOTE — Progress Notes (Signed)
I received a copy of PT/INR done on 06/14/13 at [facility].  PT/INR=22.7/2.3.  Will look for next PT/INR to be done around 07/07/13.

## 2013-07-08 ENCOUNTER — Telehealth: Payer: Self-pay | Admitting: Pharmacist

## 2013-07-08 NOTE — Telephone Encounter (Signed)
Called patient's home to discuss when patient will have INR checked as we have not received a fax this week. Pt was unavailable. Spoke with patient's wife, Terri Piedra. She will relay the message to her husband and have patient call our clinic back.

## 2013-07-12 ENCOUNTER — Ambulatory Visit: Payer: Medicaid Other | Admitting: Pharmacist

## 2013-07-12 ENCOUNTER — Other Ambulatory Visit: Payer: Self-pay | Admitting: *Deleted

## 2013-07-12 DIAGNOSIS — R1032 Left lower quadrant pain: Secondary | ICD-10-CM

## 2013-07-12 DIAGNOSIS — I2699 Other pulmonary embolism without acute cor pulmonale: Secondary | ICD-10-CM

## 2013-07-12 LAB — POCT INR: INR: 2.6

## 2013-07-12 MED ORDER — OMEPRAZOLE 20 MG PO CPDR: 20.0000 mg | DELAYED_RELEASE_CAPSULE | Freq: Every day | ORAL | Status: DC

## 2013-07-12 NOTE — Progress Notes (Signed)
INR at goal No charge Received fax with INR results from [facility] in [place], [place] Called to discuss results and next INR check with Patient but was unable to reach pt at home number or daughter's number. Left message for patient to call back if he has concerns/questions/issues regarding his coumadin Otherwise I instructed patient on the voicemail to continue coumadin 7.5 mg daily except for 10 mg on Thursdays and to recheck his INR in 3-4 weeks and have the results faxed in  Unless pt is in [place], then pt will call to set up appointment in the clinic

## 2013-07-12 NOTE — Patient Instructions (Signed)
Take coumadin 7.5mg  daily except 10mg  on Thursdays. Recheck INR in 3 weeks on 08/09/13 at [facility]. Pt will call us if he will be in [place] and is able to come to [facility] for his INR check.

## 2013-07-28 ENCOUNTER — Other Ambulatory Visit: Payer: Self-pay | Admitting: *Deleted

## 2013-07-28 MED ORDER — WARFARIN SODIUM 5 MG PO TABS: 7.5000 mg | ORAL_TABLET | Freq: Every evening | ORAL | Status: DC

## 2013-08-30 ENCOUNTER — Telehealth: Payer: Self-pay | Admitting: Pharmacist

## 2013-09-03 ENCOUNTER — Ambulatory Visit: Payer: Medicare Other | Admitting: Pharmacist

## 2013-09-03 ENCOUNTER — Other Ambulatory Visit (HOSPITAL_BASED_OUTPATIENT_CLINIC_OR_DEPARTMENT_OTHER): Payer: Medicare Other

## 2013-09-03 DIAGNOSIS — I2699 Other pulmonary embolism without acute cor pulmonale: Secondary | ICD-10-CM

## 2013-09-03 LAB — POCT INR: INR: 1.5

## 2013-09-03 LAB — PROTIME-INR
INR: 1.5 — ABNORMAL LOW (ref 2.00–3.50)
Protime: 18 Seconds — ABNORMAL HIGH (ref 10.6–13.4)

## 2013-09-03 NOTE — Progress Notes (Signed)
INR below goal today. Pt has been taking 7.5mg  daily (not 7.5mg  daily except 10mg  on Thu as documented) since the middle of November.  He simply forgot to take the 10mg  on Thu.   Pt took an occasional ASA - containing OTC product for about a month for his knee pain. He stopped the OTC product about one week ago. Pt would skip taking his coumadin on the days that he took the ASA containing OTC product. 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 Eats convenient, fast foods. No s/s of clotting. No problems to report regarding anticoagulation. Take coumadin 10mg  on 12/26 and 12/27.  On 12/28, continue coumadin 7.5mg  daily except 10mg  on Thu.  Recheck INR in 2 weeks on 09/17/13 at 11am.   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

## 2013-09-03 NOTE — Patient Instructions (Signed)
Take coumadin 10mg  on 12/26 and 12/27.  On 12/28, continue coumadin 7.5mg  daily except 10mg  on Thu.  Recheck INR in 2 weeks on 09/17/13 at 11am.

## 2013-09-17 ENCOUNTER — Other Ambulatory Visit: Payer: Medicare Other

## 2013-09-17 ENCOUNTER — Ambulatory Visit: Payer: Medicare Other

## 2013-09-17 ENCOUNTER — Telehealth: Payer: Self-pay | Admitting: Gastroenterology

## 2013-09-17 NOTE — Telephone Encounter (Signed)
Informed wife pt's Recall COLON will be 02/18/2021; wife stated understanding. Recall in for 2022.

## 2013-09-17 NOTE — Telephone Encounter (Signed)
He needs followup colonoscopy in 2022

## 2013-09-17 NOTE — Telephone Encounter (Signed)
Pt is calling about his COLON Recall. Last COLON 6.26.12 and he had normal biopsies. He cancelled his other appts and I see nothing in his letter about a Recall and no Recall is in. Please advise. Thanks.

## 2013-09-21 LAB — PROTIME-INR: INR: 1.8 — AB (ref 0.9–1.1)

## 2013-09-23 ENCOUNTER — Telehealth: Payer: Self-pay | Admitting: Pharmacist

## 2013-09-27 ENCOUNTER — Other Ambulatory Visit: Payer: Self-pay | Admitting: *Deleted

## 2013-09-27 MED ORDER — WARFARIN SODIUM 5 MG PO TABS: 7.5000 mg | ORAL_TABLET | Freq: Every evening | ORAL | Status: DC

## 2013-09-28 ENCOUNTER — Ambulatory Visit: Payer: Medicaid Other | Admitting: Pharmacist

## 2013-09-28 DIAGNOSIS — I2699 Other pulmonary embolism without acute cor pulmonale: Secondary | ICD-10-CM

## 2013-09-28 LAB — POCT INR
INR: 1.3
INR: 1.8

## 2013-09-28 NOTE — Progress Notes (Signed)
INR = 1.8; result faxed from [facility] in [place] has been taking Coumadin 7.5 mg/day except 10 mg on Thursdays. He has no complaints re: anticoag. He has a new phone number: [phone] I advised him to increase his Coumadin to 7.5 mg/day except 10 mg on Tues/Thurs since his INR has been low now x 2 readings. He will return to [facility] on 10/08/13 for next protime. We will call him w/ results/dosing instructions. NO CHARGE- phone encounter. Kennith Center, Pharm.D., CPP 09/28/2013@4 :51 PM

## 2013-10-12 ENCOUNTER — Encounter (INDEPENDENT_AMBULATORY_CARE_PROVIDER_SITE_OTHER): Payer: Self-pay

## 2013-10-12 ENCOUNTER — Other Ambulatory Visit (HOSPITAL_BASED_OUTPATIENT_CLINIC_OR_DEPARTMENT_OTHER): Payer: Medicaid Other

## 2013-10-12 ENCOUNTER — Ambulatory Visit: Payer: Medicare Other | Admitting: Pharmacist

## 2013-10-12 ENCOUNTER — Telehealth: Payer: Self-pay | Admitting: Pharmacist

## 2013-10-12 DIAGNOSIS — Z7901 Long term (current) use of anticoagulants: Secondary | ICD-10-CM

## 2013-10-12 DIAGNOSIS — I2699 Other pulmonary embolism without acute cor pulmonale: Secondary | ICD-10-CM

## 2013-10-12 LAB — PROTIME-INR
INR: 2.2 (ref 2.00–3.50)
Protime: 26.4 Seconds — ABNORMAL HIGH (ref 10.6–13.4)

## 2013-10-12 LAB — POCT INR
INR: 2.2
INR: 2.3

## 2013-10-12 MED ORDER — WARFARIN SODIUM 5 MG PO TABS: ORAL_TABLET | ORAL | Status: DC

## 2013-10-12 NOTE — Patient Instructions (Addendum)
INR at goal No changes Continue coumadin to 7.5mg  daily except 10mg  on Thurs & Sundays.  Recheck INR on 10/26/13 when back in [place] a call when you know your schedule and would like to come in to have your INR checked

## 2013-10-12 NOTE — Progress Notes (Signed)
INR at goal Pt seen in coumadin clinic today (pt in town today but heading back to [facility], [place]) Pt is doing well with no complaints Pt reports he may have missed a dose on Saturday night but he is unsure No issues regarding bleeding or bruising No medication or diet changes Pt states he prefers to take 10 mg on Thursdays and Sundays as this is easiest for him to remember Plan: No changes Continue coumadin 7.5mg  daily except 10mg  on Thursdays & Sundays.  Recheck INR around 10/26/13 when back in [place] (pt will call when he knows his schedule and when he will be back in  this should be in ~ 2 weeks)

## 2013-10-26 ENCOUNTER — Telehealth: Payer: Self-pay | Admitting: Pharmacist

## 2013-10-26 NOTE — Telephone Encounter (Signed)
Called pt to discuss checking his INR soon. Pt states he will have his INR checked this week on Thursday 10/28/13 at the [facility] in [place]. The results will be faxed to [place] at [phone].  Thank you, Montel Clock, PharmD

## 2013-11-05 ENCOUNTER — Telehealth: Payer: Self-pay | Admitting: Pharmacist

## 2013-11-06 ENCOUNTER — Encounter: Payer: Self-pay | Admitting: Oncology

## 2013-11-06 ENCOUNTER — Telehealth: Payer: Self-pay | Admitting: *Deleted

## 2013-11-06 NOTE — Telephone Encounter (Signed)
Mailed provider change letter w/ calendar to pt and cancelled Dr. G's appt. 

## 2013-11-16 ENCOUNTER — Ambulatory Visit: Payer: Medicare Other | Admitting: Oncology

## 2013-11-16 ENCOUNTER — Other Ambulatory Visit: Payer: Medicare Other

## 2013-11-16 ENCOUNTER — Ambulatory Visit: Payer: Medicare Other | Admitting: Pharmacist

## 2013-11-16 DIAGNOSIS — I2699 Other pulmonary embolism without acute cor pulmonale: Secondary | ICD-10-CM

## 2013-11-16 LAB — POCT INR: INR: 2.3

## 2013-11-16 NOTE — Progress Notes (Signed)
**  Telephone Encounter-no charge**  INR =2.3 checked at [facility].  Mr Bart sounded great and is doing well.  No med changes.  No bleeding or unusual bruising.  Will be back at [facility] on 12/06/13 for appt with new provider.  Will check PT/INR at that time.

## 2013-12-06 ENCOUNTER — Ambulatory Visit (HOSPITAL_BASED_OUTPATIENT_CLINIC_OR_DEPARTMENT_OTHER): Payer: Medicare Other | Admitting: Internal Medicine

## 2013-12-06 ENCOUNTER — Encounter: Payer: Self-pay | Admitting: Internal Medicine

## 2013-12-06 ENCOUNTER — Ambulatory Visit (HOSPITAL_BASED_OUTPATIENT_CLINIC_OR_DEPARTMENT_OTHER): Payer: Medicare Other | Admitting: Pharmacist

## 2013-12-06 ENCOUNTER — Other Ambulatory Visit (HOSPITAL_BASED_OUTPATIENT_CLINIC_OR_DEPARTMENT_OTHER): Payer: Medicare Other

## 2013-12-06 VITALS — BP 179/93 | HR 68 | Temp 98.6°F | Resp 20 | Ht 76.0 in | Wt 236.1 lb

## 2013-12-06 DIAGNOSIS — I82409 Acute embolism and thrombosis of unspecified deep veins of unspecified lower extremity: Secondary | ICD-10-CM

## 2013-12-06 DIAGNOSIS — I2699 Other pulmonary embolism without acute cor pulmonale: Secondary | ICD-10-CM

## 2013-12-06 DIAGNOSIS — I82403 Acute embolism and thrombosis of unspecified deep veins of lower extremity, bilateral: Secondary | ICD-10-CM

## 2013-12-06 DIAGNOSIS — Z7901 Long term (current) use of anticoagulants: Secondary | ICD-10-CM

## 2013-12-06 DIAGNOSIS — R1032 Left lower quadrant pain: Secondary | ICD-10-CM

## 2013-12-06 LAB — COMPREHENSIVE METABOLIC PANEL (CC13)
ALT: 10 U/L (ref 0–55)
AST: 15 U/L (ref 5–34)
Albumin: 3.9 g/dL (ref 3.5–5.0)
Alkaline Phosphatase: 59 U/L (ref 40–150)
Anion Gap: 9 mEq/L (ref 3–11)
BUN: 8.8 mg/dL (ref 7.0–26.0)
CO2: 25 mEq/L (ref 22–29)
Calcium: 9.2 mg/dL (ref 8.4–10.4)
Chloride: 109 mEq/L (ref 98–109)
Creatinine: 1 mg/dL (ref 0.7–1.3)
Glucose: 91 mg/dl (ref 70–140)
Potassium: 4.1 mEq/L (ref 3.5–5.1)
Sodium: 142 mEq/L (ref 136–145)
Total Bilirubin: 0.46 mg/dL (ref 0.20–1.20)
Total Protein: 6.5 g/dL (ref 6.4–8.3)

## 2013-12-06 LAB — CBC WITH DIFFERENTIAL/PLATELET
BASO%: 0.7 % (ref 0.0–2.0)
Basophils Absolute: 0 10*3/uL (ref 0.0–0.1)
EOS%: 0.7 % (ref 0.0–7.0)
Eosinophils Absolute: 0 10*3/uL (ref 0.0–0.5)
HCT: 41.5 % (ref 38.4–49.9)
HGB: 13 g/dL (ref 13.0–17.1)
LYMPH%: 28 % (ref 14.0–49.0)
MCH: 22.5 pg — ABNORMAL LOW (ref 27.2–33.4)
MCHC: 31.3 g/dL — ABNORMAL LOW (ref 32.0–36.0)
MCV: 71.8 fL — ABNORMAL LOW (ref 79.3–98.0)
MONO#: 0.4 10*3/uL (ref 0.1–0.9)
MONO%: 6.7 % (ref 0.0–14.0)
NEUT#: 3.4 10*3/uL (ref 1.5–6.5)
NEUT%: 63.9 % (ref 39.0–75.0)
Platelets: 229 10*3/uL (ref 140–400)
RBC: 5.79 10*6/uL (ref 4.20–5.82)
RDW: 15.3 % — ABNORMAL HIGH (ref 11.0–14.6)
WBC: 5.3 10*3/uL (ref 4.0–10.3)
lymph#: 1.5 10*3/uL (ref 0.9–3.3)

## 2013-12-06 LAB — PROTIME-INR
INR: 2.3 (ref 2.00–3.50)
Protime: 27.6 Seconds — ABNORMAL HIGH (ref 10.6–13.4)

## 2013-12-06 LAB — POCT INR: INR: 2.3

## 2013-12-06 MED ORDER — OMEPRAZOLE 20 MG PO CPDR: 20.0000 mg | DELAYED_RELEASE_CAPSULE | Freq: Every day | ORAL | Status: DC

## 2013-12-06 MED ORDER — PROMETHAZINE HCL 25 MG PO TABS: 25.0000 mg | ORAL_TABLET | Freq: Three times a day (TID) | ORAL | Status: DC | PRN

## 2013-12-06 NOTE — Progress Notes (Signed)
[facility] [phone]   [phone]  OFFICE PROGRESS NOTE  [name], MD [address] 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  PRIOR THERAPY: Status post IVC filter placement  CURRENT THERAPY: Chronic Coumadin treatment followed by the Coumadin clinic at the [facility].  INTERVAL HISTORY: [name] [name] 63 y.o. male returns to the clinic today for followup visit. He is a former patient of [name] is here today to establish care with me after [name] left the practice. The patient was last seen by Dr. Riki Sheer in September of 2013. The last recommendation was for the patient to continue on anticoagulation for life. He is followed by the Coumadin clinic at the [facility]. His current dose of Coumadin 7.5 mg by mouth daily except Thursday and Sunday the patient takes 10 mg. He is tolerating his treatment with Coumadin fairly well with no significant bleeding issues. His last INR today was 2.3. The patient denied having any significant chest pain, shortness breath, cough or hemoptysis. He denied having any significant weight loss or night sweats. He has mild swelling in the lower extremities.  MEDICAL HISTORY: Past Medical History  Diagnosis Date  . Hypertension   . History of blood clots   . Diverticulitis   . Hernia   . Diverticulosis   . Shingles   . Gout   . Back pain   . Neuropathy, peripheral 05/15/2012    ALLERGIES:  is allergic to aspirin; celecoxib; ibuprofen; and lyrica.  MEDICATIONS:  Current Outpatient Prescriptions  Medication Sig Dispense Refill  . albuterol (PROVENTIL HFA;VENTOLIN HFA) 108 (90 BASE) MCG/ACT inhaler Inhale 1-2 puffs into the lungs every 6 (six) hours as needed for wheezing.  1 Inhaler  0   . benazepril (LOTENSIN) 40 MG tablet Take 80 mg by mouth Daily.      . DULoxetine (CYMBALTA) 60 MG capsule Take 120 mg by mouth daily.      . metoprolol succinate (TOPROL-XL) 100 MG 24 hr tablet Take 100 mg by mouth daily. Take with or immediately following a meal.      . NIFEDICAL XL 60 MG 24 hr tablet Take 120 mg by mouth daily.       Marland Kitchen oxyCODONE-acetaminophen (PERCOCET) 10-325 MG per tablet Take 1 tablet by mouth every 4 (four) hours as needed for pain.  30 tablet  0  . promethazine (PHENERGAN) 25 MG tablet Take 1 tablet (25 mg total) by mouth every 8 (eight) hours as needed for nausea.  30 tablet  0  . VOLTAREN 1 % GEL Apply 1 application topically as needed. Apply to painful areas as needed.      . warfarin (COUMADIN) 5 MG tablet Take 7.5 mg (1.5 tablets) by mouth daily except 10mg  (2 tablets) on Thursday and Sunday or as directed  75 tablet  2  . omeprazole (PRILOSEC) 20 MG capsule Take 1 capsule (20 mg total) by mouth daily.  30 capsule  4  . [DISCONTINUED] colchicine 0.6 MG tablet Take 0.6 mg by mouth 2 (two) times daily.      . [DISCONTINUED] isosorbide mononitrate (IMDUR) 60 MG 24 hr tablet Take 60 mg by mouth daily.       No current facility-administered medications for this visit.    SURGICAL HISTORY:  Past Surgical History  Procedure  Laterality Date  . Back surgery    . Hernia repair    . Hiatal hernia repair      REVIEW OF SYSTEMS:  A comprehensive review of systems was negative.   PHYSICAL EXAMINATION: General appearance: alert, cooperative and no distress Head: Normocephalic, without obvious abnormality, atraumatic Neck: no adenopathy, no JVD, supple, symmetrical, trachea midline and thyroid not enlarged, symmetric, no tenderness/mass/nodules Lymph nodes: Cervical, supraclavicular, and axillary nodes normal. Resp: clear to auscultation bilaterally Back: symmetric, no curvature. ROM normal. No CVA tenderness. Cardio: regular rate and rhythm, S1, S2 normal, no murmur,  click, rub or gallop GI: soft, non-tender; bowel sounds normal; no masses,  no organomegaly Extremities: extremities normal, atraumatic, no cyanosis or edema Neurologic: Alert and oriented X 3, normal strength and tone. Normal symmetric reflexes. Normal coordination and gait  ECOG PERFORMANCE STATUS: 1 - Symptomatic but completely ambulatory  Blood pressure 179/93, pulse 68, temperature 98.6 F (37 C), temperature source Oral, resp. rate 20, height 6\' 4"  (1.93 m), weight 236 lb 1.6 oz (107.094 kg), SpO2 97.00%.  LABORATORY DATA: Lab Results  Component Value Date   WBC 5.3 12/06/2013   HGB 13.0 12/06/2013   HCT 41.5 12/06/2013   MCV 71.8* 12/06/2013   PLT 229 12/06/2013      Chemistry      Component Value Date/Time   NA 141 11/10/2012 1140   K 3.6 11/10/2012 1140   CL 102 11/10/2012 1140   CO2 28 11/10/2012 1140   BUN 8 11/10/2012 1140   CREATININE 1.05 11/10/2012 1140      Component Value Date/Time   CALCIUM 9.5 11/10/2012 1140   ALKPHOS 59 11/10/2012 1140   AST 21 11/10/2012 1140   ALT 19 11/10/2012 1140   BILITOT 0.7 11/10/2012 1140       RADIOGRAPHIC STUDIES: No results found.  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 He has been on chronic treatment with Coumadin for many years and he is expected to stay on Coumadin for lifelong. I recommended for the patient to continue his current dose of Coumadin. He is followed closely by the Coumadin clinic at the [facility]. 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 He was advised to call immediately if he has any concerning symptoms in the interval.  The patient voices understanding of current disease status and treatment options and is in agreement with the current care plan.  All questions were answered. The patient knows to call the clinic with any problems,  questions or concerns. We can certainly see the patient much sooner if necessary.  Disclaimer: This note was dictated with voice recognition software. Similar sounding words can inadvertently be transcribed and may not be corrected upon review.

## 2013-12-06 NOTE — Patient Instructions (Signed)
Smoking Cessation, Tips for Success If you are ready to quit smoking, congratulations! You have chosen to help yourself be healthier. Cigarettes bring nicotine, tar, carbon monoxide, and other irritants into your body. Your lungs, heart, and blood vessels will be able to work better without these poisons. There are many different ways to quit smoking. Nicotine gum, nicotine patches, a nicotine inhaler, or nicotine nasal spray can help with physical craving. Hypnosis, support groups, and medicines help break the habit of smoking. WHAT THINGS CAN I DO TO MAKE QUITTING EASIER?  Here are some tips to help you quit for good:  Pick a date when you will quit smoking completely. Tell all of your friends and family about your plan to quit on that date.  Do not try to slowly cut down on the number of cigarettes you are smoking. Pick a quit date and quit smoking completely starting on that day.  Throw away all cigarettes.   Clean and remove all ashtrays from your home, work, and car.   On a card, write down your reasons for quitting. Carry the card with you and read it when you get the urge to smoke.   Cleanse your body of nicotine. Drink enough water and fluids to keep your urine clear or pale yellow. Do this after quitting to flush the nicotine from your body.   Learn to predict your moods. Do not let a bad situation be your excuse to have a cigarette. Some situations in your life might tempt you into wanting a cigarette.   Never have "just one" cigarette. It leads to wanting another and another. Remind yourself of your decision to quit.   Change habits associated with smoking. If you smoked while driving or when feeling stressed, try other activities to replace smoking. Stand up when drinking your coffee. Brush your teeth after eating. Sit in a different chair when you read the paper. Avoid alcohol while trying to quit, and try to drink fewer caffeinated beverages. Alcohol and caffeine may urge  you to smoke.   Avoid foods and drinks that can trigger a desire to smoke, such as sugary or spicy foods and alcohol.   Ask people who smoke not to smoke around you.   Have something planned to do right after eating or having a cup of coffee. For example, plan to take a walk or exercise.   Try a relaxation exercise to calm you down and decrease your stress. Remember, you may be tense and nervous for the first 2 weeks after you quit, but this will pass.   Find new activities to keep your hands busy. Play with a pen, coin, or rubber band. Doodle or draw things on paper.   Brush your teeth right after eating. This will help cut down on the craving for the taste of tobacco after meals. You can also try mouthwash.   Use oral substitutes in place of cigarettes. Try using lemon drops, carrots, cinnamon sticks, or chewing gum. Keep them handy so they are available when you have the urge to smoke.   When you have the urge to smoke, try deep breathing.   Designate your home as a nonsmoking area.   If you are a heavy smoker, ask your health care provider about a prescription for nicotine chewing gum. It can ease your withdrawal from nicotine.   Reward yourself. Set aside the cigarette money you save and buy yourself something nice.   Look for support from others. Join a support group or   smoking cessation program. Ask someone at home or at work to help you with your plan to quit smoking.   Always ask yourself, "Do I need this cigarette or is this just a reflex?" Tell yourself, "Today, I choose not to smoke," or "I do not want to smoke." You are reminding yourself of your decision to quit.  Do not replace cigarette smoking with electronic cigarettes (commonly called e-cigarettes). The safety of e-cigarettes is unknown, and some may contain harmful chemicals.  If you relapse, do not give up! Plan ahead and think about what you will do the next time you get the urge to smoke.  HOW WILL  I FEEL WHEN I QUIT SMOKING? You may have symptoms of withdrawal because your body is used to nicotine (the addictive substance in cigarettes). You may crave cigarettes, be irritable, feel very hungry, cough often, get headaches, or have difficulty concentrating. The withdrawal symptoms are only temporary. They are strongest when you first quit but will go away within 10 14 days. When withdrawal symptoms occur, stay in control. Think about your reasons for quitting. Remind yourself that these are signs that your body is healing and getting used to being without cigarettes. Remember that withdrawal symptoms are easier to treat than the major diseases that smoking can cause.  Even after the withdrawal is over, expect periodic urges to smoke. However, these cravings are generally short lived and will go away whether you smoke or not. Do not smoke!  WHAT RESOURCES ARE AVAILABLE TO HELP ME QUIT SMOKING? Your health care provider can direct you to community resources or hospitals for support, which may include:  Group support.  Education.  Hypnosis.  Therapy. Document Released: 05/24/2004 Document Revised: 06/16/2013 Document Reviewed: 02/11/2013 Exit[employer] Patient Information 2014 [employer].  

## 2013-12-06 NOTE — Progress Notes (Signed)
INR = 2.3 on Coumadin 7.5 mg daily except 10 mg on Thurs/Sun Pt reports having an ear infection & dizziness as a result.  He saw Dr. Mcarthur Rossetti here in [place] & rx'd Zithromax x 5 days.  Pt has not yet picked the rx up from his pharmacy. If this does not clear up, pt will go to ENT. Pt saw Dr. [name] today to est care now that [name] is no longer in our practice. No complaints re: anticoag. INR at goal.   Zithromax has mixed potential to effect INR.  I will leave pts Coumadin dose the same since his INR is at goal. Repeat INR in 1 week to determine effect from Zithromax.   At pts request, I refilled his Phenergan & Prilosec (prescribed by [name]).  He requested refill on Chlordiazepoxide but I did not--he needs to call MD in [place] who prescribed this for a refill. I s/w pt over phone & discussed above plan. Kennith Center, Pharm.D., CPP 12/06/2013@5 :17 PM

## 2013-12-07 ENCOUNTER — Telehealth: Payer: Self-pay | Admitting: Internal Medicine

## 2013-12-07 NOTE — Telephone Encounter (Signed)
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 Pt added to [facility] list.

## 2013-12-10 ENCOUNTER — Emergency Department (HOSPITAL_COMMUNITY)
Admission: EM | Admit: 2013-12-10 | Discharge: 2013-12-10 | Disposition: A | Discharge status: Home / Self Care | Payer: Medicare Other | Attending: Emergency Medicine | Admitting: Emergency Medicine

## 2013-12-10 ENCOUNTER — Telehealth: Payer: Self-pay | Admitting: Pharmacist

## 2013-12-10 ENCOUNTER — Encounter (HOSPITAL_COMMUNITY): Payer: Self-pay | Admitting: Emergency Medicine

## 2013-12-10 DIAGNOSIS — K625 Hemorrhage of anus and rectum: Secondary | ICD-10-CM | POA: Insufficient documentation

## 2013-12-10 DIAGNOSIS — Z8669 Personal history of other diseases of the nervous system and sense organs: Secondary | ICD-10-CM | POA: Insufficient documentation

## 2013-12-10 DIAGNOSIS — Z792 Long term (current) use of antibiotics: Secondary | ICD-10-CM | POA: Insufficient documentation

## 2013-12-10 DIAGNOSIS — Z8619 Personal history of other infectious and parasitic diseases: Secondary | ICD-10-CM | POA: Insufficient documentation

## 2013-12-10 DIAGNOSIS — Z86718 Personal history of other venous thrombosis and embolism: Secondary | ICD-10-CM | POA: Insufficient documentation

## 2013-12-10 DIAGNOSIS — I10 Essential (primary) hypertension: Secondary | ICD-10-CM | POA: Insufficient documentation

## 2013-12-10 DIAGNOSIS — M25579 Pain in unspecified ankle and joints of unspecified foot: Secondary | ICD-10-CM | POA: Insufficient documentation

## 2013-12-10 DIAGNOSIS — M79673 Pain in unspecified foot: Secondary | ICD-10-CM

## 2013-12-10 DIAGNOSIS — F17200 Nicotine dependence, unspecified, uncomplicated: Secondary | ICD-10-CM | POA: Insufficient documentation

## 2013-12-10 DIAGNOSIS — Z8639 Personal history of other endocrine, nutritional and metabolic disease: Secondary | ICD-10-CM | POA: Insufficient documentation

## 2013-12-10 DIAGNOSIS — Z862 Personal history of diseases of the blood and blood-forming organs and certain disorders involving the immune mechanism: Secondary | ICD-10-CM | POA: Insufficient documentation

## 2013-12-10 DIAGNOSIS — Z7901 Long term (current) use of anticoagulants: Secondary | ICD-10-CM | POA: Insufficient documentation

## 2013-12-10 DIAGNOSIS — Z79899 Other long term (current) drug therapy: Secondary | ICD-10-CM | POA: Insufficient documentation

## 2013-12-10 HISTORY — DX: Acute embolism and thrombosis of unspecified deep veins of unspecified lower extremity: I82.409

## 2013-12-10 LAB — BASIC METABOLIC PANEL
BUN: 11 mg/dL (ref 6–23)
CO2: 29 mEq/L (ref 19–32)
Calcium: 9.3 mg/dL (ref 8.4–10.5)
Chloride: 104 mEq/L (ref 96–112)
Creatinine, Ser: 1.06 mg/dL (ref 0.50–1.35)
GFR calc Af Amer: 84 mL/min — ABNORMAL LOW (ref >90)
GFR calc non Af Amer: 73 mL/min — ABNORMAL LOW (ref >90)
Glucose, Bld: 98 mg/dL (ref 70–99)
Potassium: 4.5 mEq/L (ref 3.7–5.3)
Sodium: 141 mEq/L (ref 137–147)

## 2013-12-10 LAB — CBC WITH DIFFERENTIAL/PLATELET
Basophils Absolute: 0 10*3/uL (ref 0.0–0.1)
Basophils Relative: 1 % (ref 0–1)
Eosinophils Absolute: 0.1 10*3/uL (ref 0.0–0.7)
Eosinophils Relative: 2 % (ref 0–5)
HCT: 38.2 % — ABNORMAL LOW (ref 39.0–52.0)
Hemoglobin: 12.8 g/dL — ABNORMAL LOW (ref 13.0–17.0)
Lymphocytes Relative: 45 % (ref 12–46)
Lymphs Abs: 1.6 10*3/uL (ref 0.7–4.0)
MCH: 23.4 pg — ABNORMAL LOW (ref 26.0–34.0)
MCHC: 33.5 g/dL (ref 30.0–36.0)
MCV: 69.8 fL — ABNORMAL LOW (ref 78.0–100.0)
Monocytes Absolute: 0.3 10*3/uL (ref 0.1–1.0)
Monocytes Relative: 8 % (ref 3–12)
Neutro Abs: 1.6 10*3/uL — ABNORMAL LOW (ref 1.7–7.7)
Neutrophils Relative %: 45 % (ref 43–77)
Platelets: 225 10*3/uL (ref 150–400)
RBC: 5.47 MIL/uL (ref 4.22–5.81)
RDW: 15.7 % — ABNORMAL HIGH (ref 11.5–15.5)
WBC: 3.6 10*3/uL — ABNORMAL LOW (ref 4.0–10.5)

## 2013-12-10 LAB — TYPE AND SCREEN
ABO/RH(D): O POS
Antibody Screen: NEGATIVE

## 2013-12-10 LAB — PROTIME-INR
INR: 2.87 — ABNORMAL HIGH (ref 0.00–1.49)
Prothrombin Time: 29.1 seconds — ABNORMAL HIGH (ref 11.6–15.2)

## 2013-12-10 LAB — ABO/RH: ABO/RH(D): O POS

## 2013-12-10 MED ORDER — ONDANSETRON 4 MG PO TBDP
4.0000 mg | ORAL_TABLET | Freq: Once | ORAL | Status: AC
  Administered 2013-12-10: 4 mg via ORAL
  Filled 2013-12-10: qty 1

## 2013-12-10 MED ORDER — HYDROMORPHONE HCL PF 1 MG/ML IJ SOLN: 1.0000 mg | Freq: Once | INTRAMUSCULAR | Status: DC

## 2013-12-10 MED ORDER — OXYCODONE-ACETAMINOPHEN 5-325 MG PO TABS: 1.0000 | ORAL_TABLET | ORAL | Status: DC | PRN

## 2013-12-10 MED ORDER — ONDANSETRON HCL 4 MG/2ML IJ SOLN: 4.0000 mg | Freq: Once | INTRAMUSCULAR | Status: DC

## 2013-12-10 MED ORDER — HYDROMORPHONE HCL PF 1 MG/ML IJ SOLN
1.0000 mg | Freq: Once | INTRAMUSCULAR | Status: AC
Start: 2013-12-10 — End: 2013-12-10
  Administered 2013-12-10: 1 mg via INTRAMUSCULAR
  Filled 2013-12-10: qty 1

## 2013-12-10 NOTE — Discharge Instructions (Signed)

## 2013-12-10 NOTE — Telephone Encounter (Signed)
[name] called the coumadin clinic this morning with complaints of leg/foot numbness as well as the taste of blood in his mouth. He reports "large amounts of dark blood" in his stools the last two days and he is very concerned. [name] was evaluated in coumadin clinic this week on Monday 12/06/13 with an INR of 2.3 (at goal). He did recently start an antibiotic azithromycin, which can decrease the clearance of coumadin and increase coumadin's effects. Possible concern for GI bleed. Instructed [name] to go to the [facility] emergency room immediately for evaluation. He voiced understanding and stated he would drive over to the ED this morning.   Thank you,  Montel Clock, PharmD

## 2013-12-10 NOTE — ED Notes (Addendum)
Pt c/o rectal bleeding x 1 day, R foot pain x 3 days, and dizziness and nausea x 1 week.  Denies injury.  Pain score 8/10.  Pt reports x 2 episodes of blood in stool.  Sts he has had other bowel movements w/o blood.  Sts foot feels like it is going numb.  No swelling, warmth, and discoloration noted.  Hx of DVT.  Pt has been evaluated by PCP for dizziness and diagnosed w/ vertigo.

## 2013-12-13 ENCOUNTER — Other Ambulatory Visit: Payer: Medicare Other

## 2013-12-13 ENCOUNTER — Ambulatory Visit: Payer: Medicare Other

## 2013-12-13 ENCOUNTER — Telehealth: Payer: Self-pay | Admitting: Pharmacist

## 2013-12-16 NOTE — ED Provider Notes (Signed)
CSN: [id number]     Arrival date & time 12/10/13  1230 History   First MD Initiated Contact with Patient 12/10/13 1300     Chief Complaint  Patient presents with  . Rectal Bleeding  . Foot Pain     (Consider location/radiation/quality/duration/timing/severity/associated sxs/prior Treatment) HPI   63 year old male with rectal bleeding. Patient noticed bright red blood mixed in with this stool yesterday. He subsequently had a bowel movement with no blood noted. This morning he noticed some thickish material mixed in with this stool again. Denies any abdominal, back or rectal pain. No dizziness or lightheadedness. Denies any history of GI bleeding that he is aware of. Patient is on Coumadin. Denies any bleeding from anywhere else. Reports prior colonoscopy which was unremarkable as far as he is aware. Additionally, complaining of R foot pain. No trauma. Has been bothering for weeks. Worse with bearing weights. Pain on bottom of foot. No swelling.no rash. No numbness or tingling.   Past Medical History  Diagnosis Date  . Hypertension   . History of blood clots   . Diverticulitis   . Hernia   . Diverticulosis   . Shingles   . Gout   . Back pain   . Neuropathy, peripheral 05/15/2012  . DVT (deep venous thrombosis)    Past Surgical History  Procedure Laterality Date  . Back surgery    . Hernia repair    . Hiatal hernia repair     Family History  Problem Relation Age of Onset  . Prostate cancer Father   . Diabetes Mother   . Colon cancer Maternal Uncle   . Pancreatic cancer Paternal Uncle    History  Substance Use Topics  . Smoking status: Current Every Day Smoker -- 2.00 packs/day  . Smokeless tobacco: Current User  . Alcohol Use: Yes    Review of Systems  All systems reviewed and negative, other than as noted in HPI.   Allergies  Aspirin; Celecoxib; Ibuprofen; and Lyrica  Home Medications   Current Outpatient Rx  Name  Route  Sig  Dispense  Refill  . albuterol  (PROVENTIL HFA;VENTOLIN HFA) 108 (90 BASE) MCG/ACT inhaler   Inhalation   Inhale 1-2 puffs into the lungs every 6 (six) hours as needed for wheezing.   1 Inhaler   0   . azithromycin (ZITHROMAX) 250 MG tablet   Oral   Take 250 mg by mouth See admin instructions. Takes 2 tablets by month on first day, and 1 tablet by month a day for 4 days         . benazepril (LOTENSIN) 40 MG tablet   Oral   Take 80 mg by mouth Daily.         . DULoxetine (CYMBALTA) 60 MG capsule   Oral   Take 120 mg by mouth daily.         . hydrochlorothiazide (HYDRODIURIL) 25 MG tablet   Oral   Take 25 mg by mouth daily.         Marland Kitchen HYDROcodone-acetaminophen (NORCO/VICODIN) 5-325 MG per tablet   Oral   Take 1 tablet by mouth every 6 (six) hours as needed for moderate pain.         Marland Kitchen lisinopril (PRINIVIL,ZESTRIL) 40 MG tablet   Oral   Take 40 mg by mouth daily.         . metoprolol (LOPRESSOR) 50 MG tablet   Oral   Take 100 mg by mouth daily.          Marland Kitchen  NIFEDICAL XL 60 MG 24 hr tablet   Oral   Take 60 mg by mouth daily.          Marland Kitchen omeprazole (PRILOSEC) 20 MG capsule   Oral   Take 1 capsule (20 mg total) by mouth daily.   30 capsule   4   . promethazine (PHENERGAN) 25 MG tablet   Oral   Take 1 tablet (25 mg total) by mouth every 8 (eight) hours as needed for nausea.   30 tablet   0   . Tetrahydroz-Glyc-Hyprom-PEG (VISINE MAXIMUM REDNESS RELIEF) 0.05-0.2-0.36-1 % SOLN   Both Eyes   Place 2 drops into both eyes 2 (two) times daily.         . Vitamin D, Ergocalciferol, (DRISDOL) 50000 UNITS CAPS capsule   Oral   Take 50,000 Units by mouth every 7 (seven) days. Takes on Monday's         . VOLTAREN 1 % GEL   Topical   Apply 1 application topically as needed. Apply to painful areas as needed.         . warfarin (COUMADIN) 5 MG tablet      Take 7.5 mg (1.5 tablets) by mouth daily except 10mg  (2 tablets) on Thursday and Sunday or as directed   75 tablet   2   .  oxyCODONE-acetaminophen (PERCOCET/ROXICET) 5-325 MG per tablet   Oral   Take 1-2 tablets by mouth every 4 (four) hours as needed for severe pain.   20 tablet   0    BP 147/97  Pulse 70  Temp(Src) 98.3 F (36.8 C) (Oral)  Resp 18  SpO2 100% Physical Exam  Nursing note and vitals reviewed. Constitutional: He appears well-developed and well-nourished. No distress.  HENT:  Head: Normocephalic and atraumatic.  Eyes: Conjunctivae are normal. Right eye exhibits no discharge. Left eye exhibits no discharge.  Neck: Neck supple.  Cardiovascular: Normal rate, regular rhythm and normal heart sounds.  Exam reveals no gallop and no friction rub.   No murmur heard. Pulmonary/Chest: Effort normal and breath sounds normal. No respiratory distress.  Abdominal: Soft. He exhibits no distension. There is no tenderness.  Genitourinary:  No external rectal lesions noted or palpated on DRE. No stool in vault. Small amount of brown stool on glove. Heme +.   Musculoskeletal: He exhibits no edema and no tenderness.  Tenderness plantar aspect r foot from area of MP joints to calcaneous. No swelling/deformity/skin lesions. NVI intact.  Neurological: He is alert.  Skin: Skin is warm and dry.  Psychiatric: He has a normal mood and affect. His behavior is normal. Thought content normal.    ED Course  Procedures (including critical care time) Labs Review Labs Reviewed  PROTIME-INR - Abnormal; Notable for the following:    Prothrombin Time 29.1 (*)    INR 2.87 (*)    All other components within normal limits  CBC WITH DIFFERENTIAL - Abnormal; Notable for the following:    WBC 3.6 (*)    Hemoglobin 12.8 (*)    HCT 38.2 (*)    MCV 69.8 (*)    MCH 23.4 (*)    RDW 15.7 (*)    Neutro Abs 1.6 (*)    All other components within normal limits  BASIC METABOLIC PANEL - Abnormal; Notable for the following:    GFR calc non Af Amer 73 (*)    GFR calc Af Amer 84 (*)    All other components within normal limits   TYPE AND  SCREEN  ABO/RH   Imaging Review No results found.   EKG Interpretation None      MDM   Final diagnoses:  Rectal bleeding  Anticoagulated on Coumadin  Foot pain    62yM with rectal bleeding. Certainly more of a concern anticoagulated on coumadin. Pt reports relatively small amount though then normal appearing stool and then again small amount of pinkish material. Low suspicion for brisk/serious bleed. No pre-syncopal symptoms. Hemoglobin ok and stable from a week ago. No gross blood noted on exam, but stool heme positive. Suspect foot pain plantar fasciitis. Symptomatic tx.     Virgel Manifold, MD 12/17/13 [phone]

## 2013-12-21 ENCOUNTER — Telehealth: Payer: Self-pay | Admitting: Pharmacist

## 2013-12-21 NOTE — Telephone Encounter (Signed)
Set up next Coumadin/INR check on Friday 12/24/13 at 11:30am for lab and 11:45am for coumadin clinic. Pt still with leg pain/numbness  Thank you,  Montel Clock, PharmD

## 2013-12-24 ENCOUNTER — Other Ambulatory Visit: Payer: Medicare Other

## 2013-12-24 ENCOUNTER — Ambulatory Visit: Payer: Medicare Other

## 2013-12-27 ENCOUNTER — Telehealth: Payer: Self-pay | Admitting: Pharmacist

## 2013-12-27 ENCOUNTER — Ambulatory Visit: Payer: Medicare Other

## 2013-12-27 ENCOUNTER — Other Ambulatory Visit: Payer: Medicare Other

## 2013-12-27 NOTE — Telephone Encounter (Signed)
Called patent to r/s missed CC appmt today He will come tomorrow lab at 11:15 and CC at 11:30

## 2013-12-28 ENCOUNTER — Other Ambulatory Visit: Payer: Medicare Other

## 2013-12-28 ENCOUNTER — Ambulatory Visit (HOSPITAL_BASED_OUTPATIENT_CLINIC_OR_DEPARTMENT_OTHER): Payer: Medicare Other | Admitting: Pharmacist

## 2013-12-28 DIAGNOSIS — I2699 Other pulmonary embolism without acute cor pulmonale: Secondary | ICD-10-CM

## 2013-12-28 LAB — POCT INR: INR: 3

## 2013-12-28 LAB — PROTIME-INR
INR: 3 (ref 2.00–3.50)
Protime: 36 Seconds — ABNORMAL HIGH (ref 10.6–13.4)

## 2013-12-28 NOTE — Patient Instructions (Signed)
Continue coumadin 7.5mg  daily except 10mg  on Thurs & Sundays.  Return 01/27/14:  Lab at 11am and coumadin clinica at 11:15am.

## 2013-12-28 NOTE — Progress Notes (Signed)
INR within goal today. Pt states that he is doing better and feeling better than he has in a long time. No missed coumadin doses. Medication list updated. Dose changes on Metoprolol and Benazepril. Lisinopril discontinued and changed to benazepril. No significant changes in diet. He has been eating less lettuce and greens for some time now because it aggravates his diverticulitis. He has not noticed any blood in his stool for about 10days. No other bleeding noted.  No unusual bruising. He feels like his varicose veins are spreading and becoming more noticeable. Continue coumadin 7.5mg  daily except 10mg  on Thurs & Sundays.  Return in 4 weeks on 01/27/14:  Lab at 11am and coumadin clinica at 11:15am.  No charge coumadin clinic patient.

## 2014-01-20 ENCOUNTER — Encounter: Payer: Self-pay | Admitting: Neurology

## 2014-01-20 ENCOUNTER — Encounter (INDEPENDENT_AMBULATORY_CARE_PROVIDER_SITE_OTHER): Payer: Self-pay

## 2014-01-20 ENCOUNTER — Ambulatory Visit (INDEPENDENT_AMBULATORY_CARE_PROVIDER_SITE_OTHER): Payer: Medicare Other | Admitting: Neurology

## 2014-01-20 VITALS — BP 132/89 | HR 63 | Temp 97.8°F | Ht 76.0 in | Wt 244.5 lb

## 2014-01-20 DIAGNOSIS — F1110 Opioid abuse, uncomplicated: Secondary | ICD-10-CM

## 2014-01-20 DIAGNOSIS — R51 Headache: Secondary | ICD-10-CM

## 2014-01-20 DIAGNOSIS — F1190 Opioid use, unspecified, uncomplicated: Secondary | ICD-10-CM

## 2014-01-20 DIAGNOSIS — G4733 Obstructive sleep apnea (adult) (pediatric): Secondary | ICD-10-CM

## 2014-01-20 DIAGNOSIS — R519 Headache, unspecified: Secondary | ICD-10-CM

## 2014-01-20 DIAGNOSIS — IMO0002 Reserved for concepts with insufficient information to code with codable children: Secondary | ICD-10-CM

## 2014-01-20 DIAGNOSIS — G4752 REM sleep behavior disorder: Secondary | ICD-10-CM

## 2014-01-20 DIAGNOSIS — I82409 Acute embolism and thrombosis of unspecified deep veins of unspecified lower extremity: Secondary | ICD-10-CM

## 2014-01-20 DIAGNOSIS — G4750 Parasomnia, unspecified: Secondary | ICD-10-CM

## 2014-01-20 DIAGNOSIS — R413 Other amnesia: Secondary | ICD-10-CM

## 2014-01-20 NOTE — Patient Instructions (Addendum)
Please remember to try to maintain good sleep hygiene, which means: Keep a regular sleep and wake schedule, try not to exercise or have a meal within 2 hours of your bedtime, try to keep your bedroom conducive for sleep, that is, cool and dark, without light distractors such as an illuminated alarm clock, and refrain from watching TV right before sleep or in the middle of the night and do not keep the TV or radio on during the night. Also, try not to use or play on electronic devices at bedtime, such as your cell phone, tablet PC or laptop. If you like to read at bedtime on an electronic device, try to dim the background light as much as possible. Do not eat in the middle of the night.   Based on your symptoms and your exam I believe you are at risk for obstructive sleep apnea or OSA, and I think we should proceed with a sleep study to determine whether you do or do not have OSA and how severe it is. If you have more than mild OSA, I want you to consider treatment with CPAP. Please remember, the risks and ramifications of moderate to severe obstructive sleep apnea or OSA are: Cardiovascular disease, including congestive heart failure, stroke, difficult to control hypertension, arrhythmias, and even type 2 diabetes has been linked to untreated OSA. Sleep apnea causes disruption of sleep and sleep deprivation in most cases, which, in turn, can cause recurrent headaches, problems with memory, mood, concentration, focus, and vigilance. Most people with untreated sleep apnea report excessive daytime sleepiness, which can affect their ability to drive. Please do not drive if you feel sleepy.  I will see you back after your sleep study to go over the test results and where to go from there. We will call you after your sleep study and to set up an appointment at the time.   

## 2014-01-20 NOTE — Progress Notes (Signed)
Subjective:    Patient ID: [name] [name] is a 63 y.o. male.  HPI    Star Age, MD, PhD [facility] [address] [place], [place] [address]  Dear Dr. Jonelle Sidle,   I saw your patient, [name] [name], upon your kind request in my neurologic clinic today for initial consultation of his sleep disorder, in particula concern for obstructive sleep apnea. The patient is accompanied by his fiance, Tomie China, today. As you know, Mr. Bolle is a very pleasant 63 year old right-handed gentleman with an underlying medical history of allergies, obesity, gout, sinusitis, recurrent DVT on coumadin, chronic pain including back and arthritis, s/p spinal cord stimulator placement in 2011, and COPD with smoking 1 ppd, who, per fiance, is reported to have loud snoring, apneas, sleep talking, yelling out in sleep, flailing his arms in his sleep. He takes norco 6 times a day, but percocet is the only thing that helps him. He is prescribed norco bid, and knows he is going to run out. He has no scheduled bedtime, and may be in bed by 8 PM and others he will doze off, but may go to bed at MN. His rise time varies between 3 AM to 6-7 AM. He has morning headaches. He drinks Pepsi daily, 3 cans a day. He rarely drinks alcohol. He smokes marijuana occasionally. He has to have the TV on all night. He used to be a Administrator, and drove a Manufacturing engineer. He has EDS and his ESS is 8/24 today.  He used to be almost 500 lb and was previously diagnosed some 15 years, but says, he did not need CPAP anymore. He did have a CPAP machine and used it for 2 years and felt better. He has nocturia x 3-4 times per night. He has some LE edema.   He has been known to snore for the past many years. Snoring is reportedly marked, and associated with choking sounds and witnessed apneas. There is no report of nighttime reflux, with no nighttime cough experienced. The patient has noted some RLS in his R leg and jerks in  his sleep. There is family history of OSA in sister and nephew.  He is a restless sleeper and in the morning, the bed is quite disheveled.   He denies cataplexy, sleep paralysis, hypnagogic or hypnopompic hallucinations, or sleep attacks. He does not report any vivid dreams, nightmares, dream enactments, or parasomnias, such as sleep talking or sleep walking.   His Past Medical History Is Significant For: Past Medical History  Diagnosis Date  . Hypertension   . History of blood clots   . Diverticulitis   . Hernia   . Diverticulosis   . Shingles   . Gout   . Back pain   . Neuropathy, peripheral 05/15/2012  . DVT (deep venous thrombosis)   . Anxiety and depression     His Past Surgical History Is Significant For: Past Surgical History  Procedure Laterality Date  . Back surgery    . Hernia repair    . Hiatal hernia repair    . Appendectomy    . Cholecystectomy  2008  . Sacral nerve stimulator placement  2011    His Family History Is Significant For: Family History  Problem Relation Age of Onset  . Prostate cancer Father   . Heart disease Father   . Diabetes Mother   . Heart disease Mother   . Colon cancer Maternal Uncle   . Pancreatic cancer Paternal Uncle   .  Prostate cancer Paternal Uncle   . Multiple sclerosis Daughter   . Prostate cancer Paternal Uncle     His Social History Is Significant For: History   Social History  . Marital Status: Significant Other    Spouse Name: Giovonne Ponton    Number of Children: 3  . Years of Education: 12th   Occupational History  . unemployed Other  .     Social History Main Topics  . Smoking status: Current Every Day Smoker -- 2.00 packs/day for 40 years  . Smokeless tobacco: Current User  . Alcohol Use: No  . Drug Use: No  . Sexual Activity: None   Other Topics Concern  . None   Social History Narrative   Patient lives at home with his family.   Caffeine use: 1-2 cups daily    His Allergies Are:  Allergies   Allergen Reactions  . Aspirin Other (See Comments)    ulcers  . Celecoxib Other (See Comments)    Stomach irritation  . Ibuprofen Other (See Comments)    ulcers  . Lyrica [Pregabalin] Swelling  :   His Current Medications Are:  Outpatient Encounter Prescriptions as of 01/20/2014  Medication Sig  . DULoxetine (CYMBALTA) 60 MG capsule Take 120 mg by mouth daily.  . hydrALAZINE (APRESOLINE) 10 MG tablet Take 1 tablet by mouth 4 (four) times daily.  Marland Kitchen HYDROcodone-acetaminophen (NORCO/VICODIN) 5-325 MG per tablet Take 1 tablet by mouth 2 (two) times daily.  Marland Kitchen lisinopril (PRINIVIL,ZESTRIL) 40 MG tablet Take 1 tablet by mouth daily.  . metoprolol (LOPRESSOR) 50 MG tablet Take 1 tablet by mouth 2 (two) times daily.  Marland Kitchen omeprazole (PRILOSEC) 20 MG capsule Take 1 capsule (20 mg total) by mouth daily.  . promethazine (PHENERGAN) 25 MG tablet Take 1 tablet (25 mg total) by mouth every 8 (eight) hours as needed for nausea.  . [DISCONTINUED] metoprolol (LOPRESSOR) 50 MG tablet Take 50 mg by mouth daily.   . [DISCONTINUED] albuterol (PROVENTIL HFA;VENTOLIN HFA) 108 (90 BASE) MCG/ACT inhaler Inhale 1-2 puffs into the lungs every 6 (six) hours as needed for wheezing.  . [DISCONTINUED] benazepril (LOTENSIN) 40 MG tablet Take 40 mg by mouth Daily.   . [DISCONTINUED] hydrochlorothiazide (HYDRODIURIL) 25 MG tablet Take 25 mg by mouth daily.  . [DISCONTINUED] NIFEDICAL XL 60 MG 24 hr tablet Take 60 mg by mouth daily.   . [DISCONTINUED] oxyCODONE-acetaminophen (PERCOCET/ROXICET) 5-325 MG per tablet Take 1-2 tablets by mouth every 4 (four) hours as needed for severe pain.  . [DISCONTINUED] Tetrahydroz-Glyc-Hyprom-PEG (VISINE MAXIMUM REDNESS RELIEF) 0.05-0.2-0.36-1 % SOLN Place 2 drops into both eyes 2 (two) times daily.  . [DISCONTINUED] Vitamin D, Ergocalciferol, (DRISDOL) 50000 UNITS CAPS capsule Take 50,000 Units by mouth every 7 (seven) days. Takes on Monday's  . [DISCONTINUED] VOLTAREN 1 % GEL Apply 1  application topically as needed. Apply to painful areas as needed.  . [DISCONTINUED] warfarin (COUMADIN) 5 MG tablet Take 7.5 mg (1.5 tablets) by mouth daily except 10mg  (2 tablets) on Thursday and Sunday or as directed  :  Review of Systems:  Out of a complete 14 point review of systems, all are reviewed and negative with the exception of these symptoms as listed below:  Review of Systems  Constitutional: Positive for fever, chills and unexpected weight change (weight loss, weight gain).  HENT: Positive for tinnitus.        Spinning sensation  Eyes: Positive for pain.       Blurred vision  Respiratory: Positive for  cough, shortness of breath and wheezing.        Snoring  Endocrine: Positive for cold intolerance and heat intolerance.  Genitourinary:       Impotence  Musculoskeletal: Positive for joint swelling and myalgias.       Joint pain, cramps  Allergic/Immunologic:       Runny nose  Neurological: Positive for dizziness, speech difficulty (slurred speech), numbness and headaches.       Memory loss, confusion  Hematological: Bruises/bleeds easily.  Psychiatric/Behavioral: Positive for hallucinations and sleep disturbance (not enough sleep, insomnia, sleepiness, snoring , restless legs).       Depression, anxiety, decreased energy, change in appetite, disintrest in activities    Objective:  Neurologic Exam  Physical Exam Physical Examination:   Filed Vitals:   01/20/14 0854  BP: 132/89  Pulse: 63  Temp: 97.8 F (36.6 C)    General Examination: The patient is a very pleasant 63 y.o. male in no acute distress. He appears well-developed and well-nourished and adequately groomed.   HEENT: Normocephalic, atraumatic, pupils are equal, round and reactive to light and accommodation. Funduscopic exam is normal with sharp disc margins noted. Extraocular tracking is good without limitation to gaze excursion or nystagmus noted. Normal smooth pursuit is noted. Hearing is grossly  intact. Tympanic membranes are clear bilaterally. Face is symmetric with normal facial animation and normal facial sensation. Speech is clear with no dysarthria noted. There is no hypophonia. There is no lip, neck/head, jaw or voice tremor. Neck is supple with full range of passive and active motion. There are no carotid bruits on auscultation. Oropharynx exam reveals: moderate mouth dryness, adequate dental hygiene and marked airway crowding, due to large tongue and large uvula. Mallampati is class II. Tongue protrudes centrally and palate elevates symmetrically. Tonsils are 1+ in size. Neck size is 16 7/8 inches. He has a tiny overbite. Nasal inspection reveals no significant nasal mucosal bogginess or redness and no septal deviation.   Chest: Clear to auscultation without wheezing, rhonchi or crackles noted.  Heart: S1+S2+0, regular and normal without murmurs, rubs or gallops noted.   Abdomen: Soft, non-tender and non-distended with normal bowel sounds appreciated on auscultation.  Extremities: There is trace pitting edema in the distal lower extremities bilaterally. Pedal pulses are intact.  Skin: Warm and dry without trophic changes noted. There are no varicose veins.  Musculoskeletal: exam reveals no obvious joint deformities, tenderness or joint swelling or erythema.   Neurologically:  Mental status: The patient is awake, alert and oriented in all 4 spheres. His immediate and remote memory, attention, language skills and fund of knowledge are appropriate. There is no evidence of aphasia, agnosia, apraxia or anomia. Speech is clear with normal prosody and enunciation. Thought process is linear. Mood is normal and affect is normal.  Cranial nerves II - XII are as described above under HEENT exam. In addition: shoulder shrug is normal with equal shoulder height noted. Motor exam: Normal bulk, strength and tone is noted. There is no drift, tremor or rebound. Romberg is negative. Reflexes are 2+  throughout. Babinski: Toes are flexor bilaterally. Fine motor skills and coordination: intact with normal finger taps, normal hand movements, normal rapid alternating patting, normal foot taps and normal foot agility.  Cerebellar testing: No dysmetria or intention tremor on finger to nose testing. Heel to shin is unremarkable on the L, but not possible on the R. There is no truncal or gait ataxia.  Sensory exam: intact to light touch, pinprick, vibration, temperature  sense proprioception in the upper and lower extremities, except decreased to all modalities in the R lower leg. He had clots in the R leg.  Gait, station and balance: He stands with difficulty and has R LBP. No veering to one side is noted. No leaning to one side is noted. Posture is age-appropriate and stance is narrow based. Gait shows normal stride length and normal pace. No problems turning are noted. He turns en bloc. Tandem walk is unremarkable. Intact toe and heel stance is noted.               Assessment and Plan:   In summary, [name] [name] is a very pleasant 63 y.o.-year old male with a history and physical exam concerning for obstructive sleep apnea (OSA). He actually had OSA before and was on CPAP. 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  I had a long chat with the patient and his fiance about my findings and the diagnosis of OSA, its prognosis and treatment options. We talked about medical treatments, surgical interventions and non-pharmacological approaches. I explained in particular the risks and ramifications of untreated moderate to severe OSA, especially with respect to developing cardiovascular disease down the Road, including congestive heart failure, difficult to treat hypertension, cardiac arrhythmias, or stroke. Even type 2 diabetes has, in part, been linked to untreated OSA. Symptoms of untreated OSA include daytime sleepiness, memory problems, mood  irritability and mood disorder such as depression and anxiety, lack of energy, as well as recurrent headaches, especially morning headaches. We talked about smoking cessation and trying to maintain a healthy lifestyle in general, as well as the importance of weight control. I encouraged the patient to eat healthy, exercise daily and keep well hydrated, to keep a scheduled bedtime and wake time routine, to not skip any meals and eat healthy snacks in between meals. I advised the patient not to drive when feeling sleepy. I recommended the following at this time: sleep study with potential positive airway pressure titration.  I explained the sleep test procedure to the patient and also outlined possible surgical and non-surgical treatment options of OSA, including the use of a custom-made dental device (which would require a referral to a specialist dentist or oral surgeon), upper airway surgical options, such as pillar implants, radiofrequency surgery, tongue base surgery, and UPPP (which would involve a referral to an ENT surgeon). Rarely, jaw surgery such as mandibular advancement may be considered.  I also explained the CPAP treatment option to the patient, who indicated that he would be willing to try CPAP if the need arises. I explained the importance of being compliant with PAP treatment, not only for insurance purposes but primarily to improve His symptoms, and for the patient's long term health benefit, including to reduce His cardiovascular risks. I answered all their questions today and the patient and Tomie China were in agreement. I would like to see him back after the sleep study is completed and encouraged him to call with any interim questions, concerns, problems or updates.   Thank you very much for allowing me to participate in the care of this nice patient. If I can be of any further assistance to you please do not hesitate to call me at [phone].  Sincerely,   Star Age, MD, PhD

## 2014-01-27 ENCOUNTER — Other Ambulatory Visit: Payer: Medicare Other

## 2014-01-27 ENCOUNTER — Ambulatory Visit: Payer: Medicare Other

## 2014-01-28 ENCOUNTER — Encounter: Payer: Self-pay | Admitting: Pharmacist

## 2014-01-28 NOTE — Progress Notes (Signed)
FTKA in coumadin clinic 01/27/14.  Left message a home to call and schedule appt in coumadin clinic.

## 2014-02-03 ENCOUNTER — Ambulatory Visit (HOSPITAL_BASED_OUTPATIENT_CLINIC_OR_DEPARTMENT_OTHER): Payer: Self-pay | Admitting: Pharmacist

## 2014-02-03 ENCOUNTER — Other Ambulatory Visit (HOSPITAL_BASED_OUTPATIENT_CLINIC_OR_DEPARTMENT_OTHER): Payer: Medicare Other

## 2014-02-03 DIAGNOSIS — I2699 Other pulmonary embolism without acute cor pulmonale: Secondary | ICD-10-CM

## 2014-02-03 LAB — PROTIME-INR
INR: 2.1 (ref 2.00–3.50)
Protime: 25.2 Seconds — ABNORMAL HIGH (ref 10.6–13.4)

## 2014-02-03 LAB — POCT INR: INR: 2.1

## 2014-02-03 NOTE — Progress Notes (Signed)
*  No- Charge Patient - [name] Internal Medicine Service Patient* INR at goal Pt is doing well today with only complaint of increased headaches. He has been prescribed Norco but this is not helping. He has an appointment with an ENT physician in July but he is trying to get an earlier appointment No issues regarding unusual bleeding or bruising Pt reports no more bloody stool No missed or extra doses No medication or diet changes Pt states he may have a colonoscopy next month sometime and he will call to let us know so we can coordinate his anticoagulation plan with lovenox bridge.  Plan: No Changes Continue coumadin 7.5mg  daily except 10mg  on Thurs & Sundays.  Return 03/03/14:  Lab at 2pm and coumadin clinica at 2:15pm.

## 2014-02-03 NOTE — Patient Instructions (Addendum)
INR at goal No Changes Continue coumadin 7.5mg  daily except 10mg  on Thurs & Sundays.  Return 03/03/14:  Lab at 2pm and coumadin clinica at 2:15pm.

## 2014-02-22 ENCOUNTER — Other Ambulatory Visit: Payer: Self-pay | Admitting: Otolaryngology

## 2014-02-22 DIAGNOSIS — G43909 Migraine, unspecified, not intractable, without status migrainosus: Secondary | ICD-10-CM

## 2014-02-22 DIAGNOSIS — R42 Dizziness and giddiness: Secondary | ICD-10-CM

## 2014-02-23 ENCOUNTER — Ambulatory Visit
Admission: RE | Admit: 2014-02-23 | Discharge: 2014-02-23 | Disposition: A | Discharge status: Home / Self Care | Payer: Medicare Other | Source: Ambulatory Visit | Attending: Otolaryngology | Admitting: Otolaryngology

## 2014-02-23 ENCOUNTER — Other Ambulatory Visit: Payer: Medicare Other

## 2014-02-23 ENCOUNTER — Other Ambulatory Visit: Payer: Self-pay | Admitting: Otolaryngology

## 2014-02-23 DIAGNOSIS — R42 Dizziness and giddiness: Secondary | ICD-10-CM

## 2014-02-23 DIAGNOSIS — G43909 Migraine, unspecified, not intractable, without status migrainosus: Secondary | ICD-10-CM

## 2014-02-28 ENCOUNTER — Ambulatory Visit (INDEPENDENT_AMBULATORY_CARE_PROVIDER_SITE_OTHER): Payer: Medicare Other | Admitting: Neurology

## 2014-02-28 DIAGNOSIS — G4750 Parasomnia, unspecified: Secondary | ICD-10-CM

## 2014-02-28 DIAGNOSIS — G4730 Sleep apnea, unspecified: Secondary | ICD-10-CM

## 2014-02-28 DIAGNOSIS — G479 Sleep disorder, unspecified: Secondary | ICD-10-CM

## 2014-02-28 DIAGNOSIS — G4710 Hypersomnia, unspecified: Secondary | ICD-10-CM

## 2014-02-28 DIAGNOSIS — F1190 Opioid use, unspecified, uncomplicated: Secondary | ICD-10-CM

## 2014-02-28 DIAGNOSIS — G4752 REM sleep behavior disorder: Secondary | ICD-10-CM

## 2014-02-28 DIAGNOSIS — G4733 Obstructive sleep apnea (adult) (pediatric): Secondary | ICD-10-CM

## 2014-03-03 ENCOUNTER — Telehealth: Payer: Self-pay | Admitting: Pharmacist

## 2014-03-03 ENCOUNTER — Other Ambulatory Visit: Payer: Medicare Other

## 2014-03-03 ENCOUNTER — Ambulatory Visit: Payer: Medicare Other

## 2014-03-03 NOTE — Telephone Encounter (Signed)
Patient missed lab and coumadin clinic visit this afternoon. I called and left VM for patient to call back and reschedule these appointments  Thank you,  Montel Clock, PharmD

## 2014-03-09 ENCOUNTER — Telehealth: Payer: Self-pay | Admitting: Neurology

## 2014-03-09 DIAGNOSIS — G4733 Obstructive sleep apnea (adult) (pediatric): Secondary | ICD-10-CM

## 2014-03-09 DIAGNOSIS — G4750 Parasomnia, unspecified: Secondary | ICD-10-CM

## 2014-03-09 DIAGNOSIS — E669 Obesity, unspecified: Secondary | ICD-10-CM

## 2014-03-09 NOTE — Telephone Encounter (Signed)
Please call and notify the patient that the recent sleep study did confirm the diagnosis of obstructive sleep apnea and that I recommend treatment for this in the form of CPAP. He had significant oxygen desaturations. His sleep quality was poor and his sleep consolidation was poor and we may expect this to improve with CPAP treatment. This will require a repeat sleep study for proper titration and mask fitting. Please explain to patient and arrange for a CPAP titration study. I have placed an order in the chart. Thanks, Star Age, MD, PhD [facility])

## 2014-03-10 ENCOUNTER — Encounter: Payer: Self-pay | Admitting: *Deleted

## 2014-03-10 NOTE — Telephone Encounter (Signed)
I called and spoke with the patient about his recent sleep study results. I informed the patient that the study confirmed the diagnosis of obstructive sleep apnea and that Dr. Rexene Alberts recommend treatment in the form of CPAP. This will required a repeat study for proper titration and mask fitting. Patient has been scheduled for April 26, 2014 at 8pm. I will fax a copy of the report to Dr. Leontine Locket office and mail a copy to the patient.

## 2014-03-11 ENCOUNTER — Emergency Department (HOSPITAL_COMMUNITY)
Admission: EM | Admit: 2014-03-11 | Discharge: 2014-03-11 | Disposition: A | Discharge status: Home / Self Care | Payer: Medicare Other | Attending: Emergency Medicine | Admitting: Emergency Medicine

## 2014-03-11 ENCOUNTER — Emergency Department (HOSPITAL_COMMUNITY): Payer: Medicare Other

## 2014-03-11 ENCOUNTER — Encounter (HOSPITAL_COMMUNITY): Payer: Self-pay | Admitting: Emergency Medicine

## 2014-03-11 DIAGNOSIS — F411 Generalized anxiety disorder: Secondary | ICD-10-CM | POA: Insufficient documentation

## 2014-03-11 DIAGNOSIS — I10 Essential (primary) hypertension: Secondary | ICD-10-CM | POA: Insufficient documentation

## 2014-03-11 DIAGNOSIS — Z8639 Personal history of other endocrine, nutritional and metabolic disease: Secondary | ICD-10-CM | POA: Insufficient documentation

## 2014-03-11 DIAGNOSIS — H53149 Visual discomfort, unspecified: Secondary | ICD-10-CM | POA: Insufficient documentation

## 2014-03-11 DIAGNOSIS — Z8619 Personal history of other infectious and parasitic diseases: Secondary | ICD-10-CM | POA: Insufficient documentation

## 2014-03-11 DIAGNOSIS — R42 Dizziness and giddiness: Secondary | ICD-10-CM | POA: Insufficient documentation

## 2014-03-11 DIAGNOSIS — R209 Unspecified disturbances of skin sensation: Secondary | ICD-10-CM | POA: Insufficient documentation

## 2014-03-11 DIAGNOSIS — F3289 Other specified depressive episodes: Secondary | ICD-10-CM | POA: Insufficient documentation

## 2014-03-11 DIAGNOSIS — F329 Major depressive disorder, single episode, unspecified: Secondary | ICD-10-CM | POA: Insufficient documentation

## 2014-03-11 DIAGNOSIS — R51 Headache: Secondary | ICD-10-CM | POA: Insufficient documentation

## 2014-03-11 DIAGNOSIS — Z79899 Other long term (current) drug therapy: Secondary | ICD-10-CM | POA: Insufficient documentation

## 2014-03-11 DIAGNOSIS — Z862 Personal history of diseases of the blood and blood-forming organs and certain disorders involving the immune mechanism: Secondary | ICD-10-CM | POA: Insufficient documentation

## 2014-03-11 DIAGNOSIS — Z7901 Long term (current) use of anticoagulants: Secondary | ICD-10-CM | POA: Insufficient documentation

## 2014-03-11 DIAGNOSIS — H538 Other visual disturbances: Secondary | ICD-10-CM | POA: Insufficient documentation

## 2014-03-11 DIAGNOSIS — R519 Headache, unspecified: Secondary | ICD-10-CM

## 2014-03-11 DIAGNOSIS — Z86718 Personal history of other venous thrombosis and embolism: Secondary | ICD-10-CM | POA: Insufficient documentation

## 2014-03-11 DIAGNOSIS — F17200 Nicotine dependence, unspecified, uncomplicated: Secondary | ICD-10-CM | POA: Insufficient documentation

## 2014-03-11 DIAGNOSIS — Z8719 Personal history of other diseases of the digestive system: Secondary | ICD-10-CM | POA: Insufficient documentation

## 2014-03-11 LAB — DIFFERENTIAL
Basophils Absolute: 0 10*3/uL (ref 0.0–0.1)
Basophils Relative: 0 % (ref 0–1)
Eosinophils Absolute: 0 10*3/uL (ref 0.0–0.7)
Eosinophils Relative: 1 % (ref 0–5)
Lymphocytes Relative: 41 % (ref 12–46)
Lymphs Abs: 1.8 10*3/uL (ref 0.7–4.0)
Monocytes Absolute: 0.5 10*3/uL (ref 0.1–1.0)
Monocytes Relative: 10 % (ref 3–12)
Neutro Abs: 2.2 10*3/uL (ref 1.7–7.7)
Neutrophils Relative %: 48 % (ref 43–77)

## 2014-03-11 LAB — RAPID URINE DRUG SCREEN, HOSP PERFORMED
Amphetamines: NOT DETECTED
Barbiturates: NOT DETECTED
Benzodiazepines: NOT DETECTED
Cocaine: NOT DETECTED
Opiates: NOT DETECTED
Tetrahydrocannabinol: NOT DETECTED

## 2014-03-11 LAB — URINALYSIS, ROUTINE W REFLEX MICROSCOPIC
Bilirubin Urine: NEGATIVE
Glucose, UA: NEGATIVE mg/dL
Hgb urine dipstick: NEGATIVE
Ketones, ur: NEGATIVE mg/dL
Leukocytes, UA: NEGATIVE
Nitrite: NEGATIVE
Protein, ur: NEGATIVE mg/dL
Specific Gravity, Urine: 1.013 (ref 1.005–1.030)
Urobilinogen, UA: 0.2 mg/dL (ref 0.0–1.0)
pH: 6 (ref 5.0–8.0)

## 2014-03-11 LAB — COMPREHENSIVE METABOLIC PANEL
ALT: 13 U/L (ref 0–53)
AST: 18 U/L (ref 0–37)
Albumin: 4 g/dL (ref 3.5–5.2)
Alkaline Phosphatase: 55 U/L (ref 39–117)
Anion gap: 12 (ref 5–15)
BUN: 9 mg/dL (ref 6–23)
CO2: 27 mEq/L (ref 19–32)
Calcium: 9.4 mg/dL (ref 8.4–10.5)
Chloride: 100 mEq/L (ref 96–112)
Creatinine, Ser: 0.96 mg/dL (ref 0.50–1.35)
GFR calc Af Amer: >90 mL/min (ref >90)
GFR calc non Af Amer: 86 mL/min — ABNORMAL LOW (ref >90)
Glucose, Bld: 84 mg/dL (ref 70–99)
Potassium: 4.1 mEq/L (ref 3.7–5.3)
Sodium: 139 mEq/L (ref 137–147)
Total Bilirubin: 0.2 mg/dL — ABNORMAL LOW (ref 0.3–1.2)
Total Protein: 6.8 g/dL (ref 6.0–8.3)

## 2014-03-11 LAB — CBC
HCT: 39.1 % (ref 39.0–52.0)
Hemoglobin: 13.3 g/dL (ref 13.0–17.0)
MCH: 23.5 pg — ABNORMAL LOW (ref 26.0–34.0)
MCHC: 34 g/dL (ref 30.0–36.0)
MCV: 69 fL — ABNORMAL LOW (ref 78.0–100.0)
Platelets: 189 10*3/uL (ref 150–400)
RBC: 5.67 MIL/uL (ref 4.22–5.81)
RDW: 15.5 % (ref 11.5–15.5)
WBC: 4.5 10*3/uL (ref 4.0–10.5)

## 2014-03-11 LAB — TROPONIN I: Troponin I: <0.3 ng/mL (ref <0.30)

## 2014-03-11 LAB — PROTIME-INR
INR: 2.71 — ABNORMAL HIGH (ref 0.00–1.49)
Prothrombin Time: 28.8 seconds — ABNORMAL HIGH (ref 11.6–15.2)

## 2014-03-11 LAB — APTT: aPTT: 42 seconds — ABNORMAL HIGH (ref 24–37)

## 2014-03-11 LAB — VALPROIC ACID LEVEL: Valproic Acid Lvl: 24.5 ug/mL — ABNORMAL LOW (ref 50.0–100.0)

## 2014-03-11 LAB — I-STAT TROPONIN, ED: Troponin i, poc: 0.01 ng/mL (ref 0.00–0.08)

## 2014-03-11 LAB — ETHANOL: Alcohol, Ethyl (B): <11 mg/dL (ref 0–11)

## 2014-03-11 LAB — SEDIMENTATION RATE: Sed Rate: 2 mm/hr (ref 0–16)

## 2014-03-11 LAB — POCT INR: INR: 2.71

## 2014-03-11 MED ORDER — DIVALPROEX SODIUM 250 MG PO DR TAB: 750.0000 mg | DELAYED_RELEASE_TABLET | Freq: Two times a day (BID) | ORAL | Status: DC

## 2014-03-11 MED ORDER — KETOROLAC TROMETHAMINE 30 MG/ML IJ SOLN
30.0000 mg | Freq: Once | INTRAMUSCULAR | Status: AC
  Administered 2014-03-11: 30 mg via INTRAVENOUS
  Filled 2014-03-11: qty 1

## 2014-03-11 MED ORDER — HYDROCODONE-ACETAMINOPHEN 5-325 MG PO TABS
2.0000 | ORAL_TABLET | Freq: Once | ORAL | Status: AC
  Administered 2014-03-11: 2 via ORAL
  Filled 2014-03-11: qty 2

## 2014-03-11 MED ORDER — METOCLOPRAMIDE HCL 5 MG/ML IJ SOLN
10.0000 mg | Freq: Once | INTRAMUSCULAR | Status: AC
  Administered 2014-03-11: 10 mg via INTRAVENOUS
  Filled 2014-03-11: qty 2

## 2014-03-11 MED ORDER — DIPHENHYDRAMINE HCL 50 MG/ML IJ SOLN
25.0000 mg | Freq: Once | INTRAMUSCULAR | Status: AC
  Administered 2014-03-11: 25 mg via INTRAVENOUS
  Filled 2014-03-11: qty 1

## 2014-03-11 MED ORDER — HYDRALAZINE HCL 20 MG/ML IJ SOLN
10.0000 mg | Freq: Once | INTRAMUSCULAR | Status: AC
  Administered 2014-03-11: 10 mg via INTRAVENOUS
  Filled 2014-03-11: qty 0.5

## 2014-03-11 MED ORDER — FENTANYL CITRATE 0.05 MG/ML IJ SOLN
50.0000 ug | Freq: Once | INTRAMUSCULAR | Status: AC
  Administered 2014-03-11: 50 ug via INTRAVENOUS
  Filled 2014-03-11: qty 2

## 2014-03-11 NOTE — ED Provider Notes (Signed)
CSN: [id number]     Arrival date & time 03/11/14  1059 History   First MD Initiated Contact with Patient 03/11/14 1121     Chief Complaint  Patient presents with  . migraines      (Consider location/radiation/quality/duration/timing/severity/associated sxs/prior Treatment) HPI Comments: Patient reports right-sided headache that has been ongoing for the past 5 months. The headache is constant and daily. It waxes and wanes in severity. It is gradual in onset. The pain is in the right side of his head and radiates down his neck. He also says that touching his scalp or having air touch his scalp is painful. There is no associated nausea vomiting. There is photophobia and intermittent blurred vision. He denies any focal weakness, numbness or tingling. He has some intermittent dizziness he denies any chest pain or shortness of breath. .  he has a history of hypertension,  DVT on Coumadin, diverticulitis. His blood pressure was elevated here and states compliance with his medications. He denies any chest pain or shortness of breath. Denies eye pain.  He states he did take his blood pressure medications as prescribed. He saw an ear nose and throat doctor 2 weeks ago who told him He had sinus and migraine headaches. Dr. Jonelle Sidle started him on depakote for possible migraines.   Patient also describes seeing  A few flashes of yellow and blue light a couple times a day for the past 5 months. these last a few seconds and then go away. Denies seeing any black spots.   States he came in today because the headache was worse than usual and he has been frustrated by it.  The history is provided by the patient and a relative.    Past Medical History  Diagnosis Date  . Hypertension   . History of blood clots   . Diverticulitis   . Hernia   . Diverticulosis   . Shingles   . Gout   . Back pain   . Neuropathy, peripheral 05/15/2012  . DVT (deep venous thrombosis)   . Anxiety and depression    Past Surgical  History  Procedure Laterality Date  . Back surgery    . Hernia repair    . Hiatal hernia repair    . Appendectomy    . Cholecystectomy  2008  . Sacral nerve stimulator placement  2011   Family History  Problem Relation Age of Onset  . Prostate cancer Father   . Heart disease Father   . Diabetes Mother   . Heart disease Mother   . Colon cancer Maternal Uncle   . Pancreatic cancer Paternal Uncle   . Prostate cancer Paternal Uncle   . Multiple sclerosis Daughter   . Prostate cancer Paternal Uncle    History  Substance Use Topics  . Smoking status: Current Every Day Smoker -- 2.00 packs/day for 40 years  . Smokeless tobacco: Current User  . Alcohol Use: No    Review of Systems  Constitutional: Negative for fever, activity change and appetite change.  HENT: Negative for congestion and rhinorrhea.   Eyes: Positive for photophobia and visual disturbance. Negative for pain.  Respiratory: Negative for cough, chest tightness and shortness of breath.   Cardiovascular: Negative for chest pain.  Gastrointestinal: Negative for nausea, vomiting and abdominal pain.  Endocrine: Negative for polyuria.  Genitourinary: Negative for dysuria, hematuria and decreased urine volume.  Musculoskeletal: Negative for arthralgias and myalgias.  Skin: Negative for rash.  Neurological: Positive for dizziness, light-headedness, numbness and headaches. Negative  for weakness.  A complete 10 system review of systems was obtained and all systems are negative except as noted in the HPI and PMH.      Allergies  Aspirin; Celecoxib; Ibuprofen; and Lyrica  Home Medications   Prior to Admission medications   Medication Sig Start Date End Date Taking? Authorizing Provider  divalproex (DEPAKOTE ER) 500 MG 24 hr tablet Take 500 mg by mouth 2 (two) times daily.   Yes Historical Provider, MD  DULoxetine (CYMBALTA) 60 MG capsule Take 60 mg by mouth daily.    Yes Historical Provider, MD  hydrALAZINE (APRESOLINE)  10 MG tablet Take 1 tablet by mouth 4 (four) times daily. 01/10/14  Yes Historical Provider, MD  hydrochlorothiazide (HYDRODIURIL) 25 MG tablet Take 25 mg by mouth daily.   Yes Historical Provider, MD  HYDROcodone-acetaminophen (NORCO/VICODIN) 5-325 MG per tablet Take 1 tablet by mouth 2 (two) times daily as needed for moderate pain.  01/17/14  Yes Historical Provider, MD  lisinopril (PRINIVIL,ZESTRIL) 40 MG tablet Take 1 tablet by mouth daily. 01/02/14  Yes Historical Provider, MD  metoprolol (LOPRESSOR) 50 MG tablet Take 1 tablet by mouth 2 (two) times daily. 12/06/13  Yes Historical Provider, MD  omeprazole (PRILOSEC) 20 MG capsule Take 1 capsule (20 mg total) by mouth daily. 12/06/13  Yes Curt Bears, MD  promethazine (PHENERGAN) 25 MG tablet Take 1 tablet (25 mg total) by mouth every 8 (eight) hours as needed for nausea. 12/06/13  Yes Curt Bears, MD  warfarin (COUMADIN) 5 MG tablet Take 7.5 mg by mouth daily.   Yes Historical Provider, MD  warfarin (COUMADIN) 5 MG tablet Take 10 mg by mouth daily.   Yes Historical Provider, MD  divalproex (DEPAKOTE) 250 MG DR tablet Take 3 tablets (750 mg total) by mouth 2 (two) times daily. 03/11/14   Ezequiel Essex, MD   BP 145/84  Pulse 73  Temp(Src) 98.3 F (36.8 C) (Oral)  Resp 14  SpO2 100% Physical Exam  Nursing note and vitals reviewed. Constitutional: He is oriented to person, place, and time. He appears well-developed and well-nourished. No distress.  HENT:  Head: Normocephalic and atraumatic.  Mouth/Throat: Oropharynx is clear and moist. No oropharyngeal exudate.  Tender to right parietal scalp without discrete temporal artery tenderness No rash  Eyes: Conjunctivae and EOM are normal. Pupils are equal, round, and reactive to light.  Neck: Normal range of motion. Neck supple.  No meningismus.  Cardiovascular: Normal rate, regular rhythm, normal heart sounds and intact distal pulses.   No murmur heard. Pulmonary/Chest: Effort normal and  breath sounds normal. No respiratory distress.  Abdominal: Soft. There is no tenderness. There is no rebound and no guarding.  Musculoskeletal: Normal range of motion. He exhibits no edema and no tenderness.  Neurological: He is alert and oriented to person, place, and time. He has normal reflexes. No cranial nerve deficit. He exhibits normal muscle tone. Coordination normal.  No ataxia on finger to nose bilaterally. No pronator drift. 5/5 strength throughout. CN 2-12 intact. Negative Romberg. Equal grip strength. Sensation intact. Gait is normal.   Skin: Skin is warm.  Psychiatric: He has a normal mood and affect. His behavior is normal.    ED Course  Procedures (including critical care time) Labs Review Labs Reviewed  PROTIME-INR - Abnormal; Notable for the following:    Prothrombin Time 28.8 (*)    INR 2.71 (*)    All other components within normal limits  APTT - Abnormal; Notable for the following:  aPTT 42 (*)    All other components within normal limits  CBC - Abnormal; Notable for the following:    MCV 69.0 (*)    MCH 23.5 (*)    All other components within normal limits  COMPREHENSIVE METABOLIC PANEL - Abnormal; Notable for the following:    Total Bilirubin 0.2 (*)    GFR calc non Af Amer 86 (*)    All other components within normal limits  VALPROIC ACID LEVEL - Abnormal; Notable for the following:    Valproic Acid Lvl 24.5 (*)    All other components within normal limits  ETHANOL  DIFFERENTIAL  URINE RAPID DRUG SCREEN (HOSP PERFORMED)  URINALYSIS, ROUTINE W REFLEX MICROSCOPIC  TROPONIN I  SEDIMENTATION RATE  I-STAT TROPOININ, ED    Imaging Review Ct Head Wo Contrast  03/11/2014   CLINICAL DATA:  Dizziness and headache.  Anti coagulated.  EXAM: CT HEAD WITHOUT CONTRAST  TECHNIQUE: Contiguous axial images were obtained from the base of the skull through the vertex without intravenous contrast.  COMPARISON:  Head CT 07/12/2011  FINDINGS: The brain does not show  accelerated atrophy. There is no evidence of old or acute focal infarction, mass lesion, hemorrhage, hydrocephalus or extra-axial collection. Atherosclerotic calcification affects the major vessels at the base of the brain. The calvarium is unremarkable. Sinuses, middle ears and mastoids are clear.  IMPRESSION: No cause of the presenting symptoms is identified. Normal appearance of the brain parenchyma. Atherosclerotic calcification of the major vessels at the base of the brain.   Electronically Signed   By: Nelson Chimes M.D.   On: 03/11/2014 12:13     EKG Interpretation   Date/Time:  Friday March 11 2014 11:54:58 EDT Ventricular Rate:  75 PR Interval:  147 QRS Duration: 99 QT Interval:  381 QTC Calculation: 425 R Axis:   -66 Text Interpretation:  Sinus rhythm LAD, consider left anterior fascicular  block No significant change was found Confirmed by Wyvonnia Dusky  MD, Arietta Eisenstein  [phone]) on 03/11/2014 12:05:21 PM      MDM   Final diagnoses:  Intractable headache   5 month history of daily headaches recently become worse. Associated with intermittent blurry vision, seeing flashing lights, and dizziness. No chest pain or shortness of breath. No eye pain or pain with EOMI.  Headache pattern is the same of the previous several months and did not change acutely today. Saw neurology last month for OSA but did not have headaches addressed.  Patient has intact neurological exam. He has some soreness on the right side of his head but no discrete temporal artery tenderness.  CT head is negative. He had a CT of the sinuses in June that showed inflammation His blood pressure is uncontrolled. INR is therapeutic. At 2.7. Sedimentation rate is normal at 2. Temporal arteritis seems less likely.  Case discussed with Dr. Doy Mince of neurology who recommends increasing Depakote 750 mg twice a day. she does not recommend steroids and feels this is not temporal arteritis. Sedimentation rate is normal. Patient has  had ongoing headache for the past 5 months, unlikely to be migraine, SAH, meningitis, CVA, glaucoma. depakote level 24 today.  Blood pressure has improved to 146/90. Patient is able to ambulate and his neurological exam is intact. His headache has improved.  Patient will be discharged with increasing his Depakote level is above. Followup with neurology. Return precautions discussed.  BP 145/84  Pulse 73  Temp(Src) 98.3 F (36.8 C) (Oral)  Resp 14  SpO2 100%  Ezequiel Essex, MD 03/11/14 [id number]

## 2014-03-11 NOTE — ED Notes (Addendum)
Upon entering room, pt being transported to CT.  Spoke with Rancour MD regarding assessment detail. Per Rancour pt has had migraines with dizziness since February and unable to schedule neuro appointment for present time.

## 2014-03-11 NOTE — ED Notes (Signed)
Ambulated patient. Patient did well. Patient stated the light bothered his eyes.

## 2014-03-11 NOTE — ED Notes (Signed)
Pt reports diagnoses with migraines for months. Pt reports dizziness, blurred vision, and nausea with events. Pt denies CP but reports SOB with activity at times.

## 2014-03-11 NOTE — ED Notes (Signed)
Per pt, states headaches on and off for months-PCP told him to see Neuro-cant see til August

## 2014-03-11 NOTE — Discharge Instructions (Signed)
General Headache Without Cause Increase your Depakote to 750 mg twice a day. Followup with the neurologist this week. Return to the ED if you develop new or worsening symptoms. A headache is pain or discomfort felt around the head or neck area. The specific cause of a headache may not be found. There are many causes and types of headaches. A few common ones are:  Tension headaches.  Migraine headaches.  Cluster headaches.  Chronic daily headaches. HOME CARE INSTRUCTIONS   Keep all follow-up appointments with your caregiver or any specialist referral.  Only take over-the-counter or prescription medicines for pain or discomfort as directed by your caregiver.  Lie down in a dark, quiet room when you have a headache.  Keep a headache journal to find out what may trigger your migraine headaches. For example, write down:  What you eat and drink.  How much sleep you get.  Any change to your diet or medicines.  Try massage or other relaxation techniques.  Put ice packs or heat on the head and neck. Use these 3 to 4 times per day for 15 to 20 minutes each time, or as needed.  Limit stress.  Sit up straight, and do not tense your muscles.  Quit smoking if you smoke.  Limit alcohol use.  Decrease the amount of caffeine you drink, or stop drinking caffeine.  Eat and sleep on a regular schedule.  Get 7 to 9 hours of sleep, or as recommended by your caregiver.  Keep lights dim if bright lights bother you and make your headaches worse. SEEK MEDICAL CARE IF:   You have problems with the medicines you were prescribed.  Your medicines are not working.  You have a change from the usual headache.  You have nausea or vomiting. SEEK IMMEDIATE MEDICAL CARE IF:   Your headache becomes severe.  You have a fever.  You have a stiff neck.  You have loss of vision.  You have muscular weakness or loss of muscle control.  You start losing your balance or have trouble  walking.  You feel faint or pass out.  You have severe symptoms that are different from your first symptoms. MAKE SURE YOU:   Understand these instructions.  Will watch your condition.  Will get help right away if you are not doing well or get worse. Document Released: 08/26/2005 Document Revised: 11/18/2011 Document Reviewed: 09/11/2011 [facility] Patient Information 2015 [place], [place]. This information is not intended to replace advice given to you by your health care provider. Make sure you discuss any questions you have with your health care provider.

## 2014-03-11 NOTE — ED Notes (Addendum)
Unsuccessful IV attempt by this RN. A Nease at bedside attempting another.

## 2014-03-11 NOTE — ED Notes (Signed)
Pt encouraged to void

## 2014-03-14 ENCOUNTER — Other Ambulatory Visit: Payer: Medicare Other

## 2014-03-14 ENCOUNTER — Ambulatory Visit (INDEPENDENT_AMBULATORY_CARE_PROVIDER_SITE_OTHER): Payer: Medicare Other | Admitting: Pharmacist

## 2014-03-14 ENCOUNTER — Ambulatory Visit: Payer: Medicare Other

## 2014-03-14 DIAGNOSIS — I2699 Other pulmonary embolism without acute cor pulmonale: Secondary | ICD-10-CM

## 2014-03-14 NOTE — Patient Instructions (Signed)
Pt had INR done with ER visit on 03/11/14.  LVM to continue coumadin 7.5mg  daily except 10mg  on Thurs & Sundays.  Return 04/04/14:  Lab at 11am and coumadin clinica at 11:15am.

## 2014-03-14 NOTE — Progress Notes (Signed)
LVM for patient He was seen in ER on 03/11/14 with INR=2.71 LVM for patient to call us back with any changes Until then he should continue current dose Coumadin 7.5mg  daily except 10mg  on Thurs & Sundays.  Return 04/04/14:  Lab at 11am and coumadin clinica at 11:15am. Call us with any changes to to make new appmt if necessary.

## 2014-03-28 ENCOUNTER — Ambulatory Visit (INDEPENDENT_AMBULATORY_CARE_PROVIDER_SITE_OTHER): Payer: Medicare Other | Admitting: Gastroenterology

## 2014-03-28 ENCOUNTER — Telehealth: Payer: Self-pay | Admitting: *Deleted

## 2014-03-28 ENCOUNTER — Other Ambulatory Visit (INDEPENDENT_AMBULATORY_CARE_PROVIDER_SITE_OTHER): Payer: Medicare Other

## 2014-03-28 ENCOUNTER — Encounter: Payer: Self-pay | Admitting: Gastroenterology

## 2014-03-28 VITALS — BP 172/110 | HR 64 | Ht 76.0 in | Wt 253.8 lb

## 2014-03-28 DIAGNOSIS — K625 Hemorrhage of anus and rectum: Secondary | ICD-10-CM

## 2014-03-28 LAB — IRON AND TIBC
%SAT: 21 % (ref 20–55)
Iron: 74 ug/dL (ref 42–165)
TIBC: 350 ug/dL (ref 215–435)
UIBC: 276 ug/dL (ref 125–400)

## 2014-03-28 LAB — IRON: Iron: 79 ug/dL (ref 42–165)

## 2014-03-28 LAB — FERRITIN: Ferritin: 39.2 ng/mL (ref 22.0–322.0)

## 2014-03-28 NOTE — Assessment & Plan Note (Signed)
Six-month history of intermitt rectal bleeding.  Patient complains of mild left lower quadrant discomfort relieved with defecation.  Symptoms of discomfort and diarrhea have been ongoing for years.  Lab is pertinent for a normal hemoglobin with microcytic indices.  Bleeding sources including polyps, hemorrhoids and possibly colitis are considerations.  Neoplasm is less likely in view of colonoscopy in 2012.  Recommendations #1 colonoscopy; I will check with the patient's hematologist whether Coumadin can be held #2 check iron studies including iron, TIBC and ferritin  The risk of holding anticoagulation therapy or antiplatelet medications was discussed including the increased risk for thromboembolic disease that may include DVT, pulmonary emboli and stroke. The patient understands this risk and is willing to proceed with temporally holding the medication provided that this is approved by her PCP or cardiologist.

## 2014-03-28 NOTE — Telephone Encounter (Signed)
Hi Robin, I have not started the patient on Eliquis. Please consult with his cardiologist or whoever started this medication. Thank you.  [name]

## 2014-03-28 NOTE — Patient Instructions (Addendum)
You will be contaced by our office prior to your procedure for directions on holding your ELIQUIS.  If you do not hear from our office 1 week prior to your scheduled procedure, please call [phone] to discuss.  You have been scheduled for a colonoscopy. Please follow written instructions given to you at your visit today.  Please pick up your prep kit at the pharmacy within the next 1-3 days. If you use inhalers (even only as needed), please bring them with you on the day of your procedure. Your physician has requested that you go to www.startemmi.com and enter the access code given to you at your visit today. This web site gives a general overview about your procedure. However, you should still follow specific instructions given to you by our office regarding your preparation for the procedure.  Suprep given today  Go to the basement for labs today

## 2014-03-28 NOTE — Telephone Encounter (Signed)
03/28/2014   RE: [name] [name] DOB: 07-02-1951 MRN: [id number]   Dear  [name],    We have scheduled the above patient for an endoscopic procedure. Our records show that he is on anticoagulation therapy.   Please advise as to how long the patient may come off his therapy of Eliquis prior to the procedure, which is scheduled for 05/18/2014.  Please fax back/ or route the completed form to [facility] [place] at [phone].   Sincerely,    Genella Mech

## 2014-03-28 NOTE — Progress Notes (Signed)
_                                                                                                                History of Present Illness: Pleasant 63 year old Afro-American male with history of pulmonary embolus, on Coumadin, status post IVC filter placement, referred for evaluation of rectal bleeding.  Over the past 6 months he's been experiencing intermittent rectal bleeding consisting of bright red blood mixed with his stools and in the color of water.  He occasionally has rectal discomfort.  Stools tend to be mixed consisting of solid stools and, at other times, loose stools.  Complains of pressure in left lower quadrant which is relieved by defecation.  Colonoscopy in 2012 for diarrhea was normal.  Random biopsies were negative.  There's been no change in his medications.    Past Medical History  Diagnosis Date  . Hypertension   . History of blood clots   . Diverticulitis   . Hernia   . Diverticulosis   . Shingles   . Gout   . Back pain   . Neuropathy, peripheral 05/15/2012  . DVT (deep venous thrombosis)   . Anxiety and depression    Past Surgical History  Procedure Laterality Date  . Back surgery    . Hernia repair    . Hiatal hernia repair    . Appendectomy    . Cholecystectomy  2008  . Sacral nerve stimulator placement  2011   family history includes Colon cancer in his maternal uncle; Diabetes in his mother; Heart disease in his father and mother; Multiple sclerosis in his daughter; Pancreatic cancer in his paternal uncle; Prostate cancer in his father, paternal uncle, and paternal uncle. Current Outpatient Prescriptions  Medication Sig Dispense Refill  . divalproex (DEPAKOTE ER) 500 MG 24 hr tablet Take 500 mg by mouth 2 (two) times daily.      . divalproex (DEPAKOTE) 250 MG DR tablet Take 3 tablets (750 mg total) by mouth 2 (two) times daily.  60 tablet  0  . DULoxetine (CYMBALTA) 60 MG capsule Take 60 mg by mouth daily.       . hydrALAZINE  (APRESOLINE) 10 MG tablet Take 1 tablet by mouth 4 (four) times daily.      . hydrochlorothiazide (HYDRODIURIL) 25 MG tablet Take 25 mg by mouth daily.      Marland Kitchen HYDROcodone-acetaminophen (NORCO/VICODIN) 5-325 MG per tablet Take 1 tablet by mouth 2 (two) times daily as needed for moderate pain.       Marland Kitchen lisinopril (PRINIVIL,ZESTRIL) 40 MG tablet Take 1 tablet by mouth daily.      . metoprolol (LOPRESSOR) 50 MG tablet Take 1 tablet by mouth 2 (two) times daily.      Marland Kitchen NIFEDICAL XL 60 MG 24 hr tablet       . omeprazole (PRILOSEC) 20 MG capsule Take 1 capsule (20 mg total) by mouth daily.  30 capsule  4  . PROAIR HFA 108 (90 BASE) MCG/ACT inhaler       .  promethazine (PHENERGAN) 25 MG tablet Take 1 tablet (25 mg total) by mouth every 8 (eight) hours as needed for nausea.  30 tablet  0  . VOLTAREN 1 % GEL       . warfarin (COUMADIN) 5 MG tablet Take 7.5 mg by mouth daily.      Marland Kitchen warfarin (COUMADIN) 5 MG tablet Take 10 mg by mouth daily.      . [DISCONTINUED] colchicine 0.6 MG tablet Take 0.6 mg by mouth 2 (two) times daily.      . [DISCONTINUED] isosorbide mononitrate (IMDUR) 60 MG 24 hr tablet Take 60 mg by mouth daily.       No current facility-administered medications for this visit.   Allergies as of 03/28/2014 - Review Complete 03/28/2014  Allergen Reaction Noted  . Aspirin Other (See Comments) 01/29/2011  . Celecoxib Other (See Comments) 11/28/2011  . Ibuprofen Other (See Comments) 01/29/2011  . Lyrica [pregabalin] Swelling 01/29/2011    reports that he has been smoking Cigarettes.  He has a 80 pack-year smoking history. He has never used smokeless tobacco. He reports that he does not drink alcohol or use illicit drugs.     Review of Systems: He complains of frequent headaches which have been attributed to migraines Pertinent positive and negative review of systems were noted in the above HPI section. All other review of systems were otherwise negative.  Vital signs were reviewed in  today's medical record Physical Exam: General: Well developed , well nourished, no acute distress Skin: anicteric Head: Normocephalic and atraumatic Eyes:  sclerae anicteric, EOMI Ears: Normal auditory acuity Mouth: No deformity or lesions Neck: Supple, no masses or thyromegaly Lungs: Clear throughout to auscultation Heart: Regular rate and rhythm; no murmurs, rubs or bruits Abdomen: Soft,  non distended. No masses, hepatosplenomegaly or hernias noted. Normal Bowel sounds.  There is mild tenderness to palpation in the left lower clutch in Rectal:deferred Musculoskeletal: Symmetrical with no gross deformities  Skin: No lesions on visible extremities Pulses:  Normal pulses noted Extremities: No clubbing, cyanosis, edema or deformities noted Neurological: Alert oriented x 4, grossly nonfocal Cervical Nodes:  No significant cervical adenopathy Inguinal Nodes: No significant inguinal adenopathy Psychological:  Alert and cooperative. Normal mood and affect  See Assessment and Plan under Problem List

## 2014-04-04 ENCOUNTER — Other Ambulatory Visit (HOSPITAL_BASED_OUTPATIENT_CLINIC_OR_DEPARTMENT_OTHER): Payer: Medicare Other

## 2014-04-04 ENCOUNTER — Ambulatory Visit (HOSPITAL_BASED_OUTPATIENT_CLINIC_OR_DEPARTMENT_OTHER): Payer: Medicare Other | Admitting: Pharmacist

## 2014-04-04 DIAGNOSIS — I82409 Acute embolism and thrombosis of unspecified deep veins of unspecified lower extremity: Secondary | ICD-10-CM

## 2014-04-04 DIAGNOSIS — Z7901 Long term (current) use of anticoagulants: Secondary | ICD-10-CM

## 2014-04-04 DIAGNOSIS — I2699 Other pulmonary embolism without acute cor pulmonale: Secondary | ICD-10-CM

## 2014-04-04 LAB — CBC WITH DIFFERENTIAL/PLATELET
BASO%: 1.1 % (ref 0.0–2.0)
Basophils Absolute: 0.1 10*3/uL (ref 0.0–0.1)
EOS%: 1.1 % (ref 0.0–7.0)
Eosinophils Absolute: 0 10*3/uL (ref 0.0–0.5)
HCT: 41.7 % (ref 38.4–49.9)
HGB: 13.1 g/dL (ref 13.0–17.1)
LYMPH%: 32.3 % (ref 14.0–49.0)
MCH: 22.7 pg — ABNORMAL LOW (ref 27.2–33.4)
MCHC: 31.3 g/dL — ABNORMAL LOW (ref 32.0–36.0)
MCV: 72.4 fL — ABNORMAL LOW (ref 79.3–98.0)
MONO#: 0.5 10*3/uL (ref 0.1–0.9)
MONO%: 10.6 % (ref 0.0–14.0)
NEUT#: 2.5 10*3/uL (ref 1.5–6.5)
NEUT%: 54.9 % (ref 39.0–75.0)
Platelets: 210 10*3/uL (ref 140–400)
RBC: 5.76 10*6/uL (ref 4.20–5.82)
RDW: 16.5 % — ABNORMAL HIGH (ref 11.0–14.6)
WBC: 4.6 10*3/uL (ref 4.0–10.3)
lymph#: 1.5 10*3/uL (ref 0.9–3.3)

## 2014-04-04 LAB — COMPREHENSIVE METABOLIC PANEL (CC13)
ALT: 10 U/L (ref 0–55)
AST: 14 U/L (ref 5–34)
Albumin: 3.8 g/dL (ref 3.5–5.0)
Alkaline Phosphatase: 45 U/L (ref 40–150)
Anion Gap: 7 mEq/L (ref 3–11)
BUN: 10.1 mg/dL (ref 7.0–26.0)
CO2: 30 mEq/L — ABNORMAL HIGH (ref 22–29)
Calcium: 9.3 mg/dL (ref 8.4–10.4)
Chloride: 106 mEq/L (ref 98–109)
Creatinine: 1 mg/dL (ref 0.7–1.3)
Glucose: 89 mg/dl (ref 70–140)
Potassium: 3.7 mEq/L (ref 3.5–5.1)
Sodium: 143 mEq/L (ref 136–145)
Total Bilirubin: 0.48 mg/dL (ref 0.20–1.20)
Total Protein: 6.6 g/dL (ref 6.4–8.3)

## 2014-04-04 LAB — PROTIME-INR
INR: 3.3 (ref 2.00–3.50)
Protime: 39.6 Seconds — ABNORMAL HIGH (ref 10.6–13.4)

## 2014-04-04 LAB — POCT INR: INR: 3.3

## 2014-04-04 NOTE — Progress Notes (Signed)
INR = 3.3      Goal 2-3 INR just over goal range. No complications of anticoagulation noted. Only new medication is Depakote for migraine headaches. Divalproate has a minor drug interaction with warfarin and may decrease protein binding of warfarin. He has been eating less green vegetables recently which likely accounts for the increased INR. Continue coumadin 7.5mg  daily except 10mg  on Thurs & Sundays. He will return 05/02/14:  Lab at 2pm and coumadin clinic at 2:15pm. [facility] has requested warfarin refills this morning, will call in refills. Gave patient Coumadin samples 5 mg x 50 tabs.  Lot:  [id number]   Exp:  11/16  Patient states he will have a colonoscopy around 05/10/14, he will need instructions on bridging Coumadin.  [name], PharmD

## 2014-04-12 ENCOUNTER — Other Ambulatory Visit: Payer: Self-pay | Admitting: Specialist

## 2014-04-12 DIAGNOSIS — G4482 Headache associated with sexual activity: Secondary | ICD-10-CM

## 2014-04-18 NOTE — Telephone Encounter (Signed)
Phone call - encounter closed. 

## 2014-04-26 ENCOUNTER — Ambulatory Visit (INDEPENDENT_AMBULATORY_CARE_PROVIDER_SITE_OTHER): Payer: Medicare Other | Admitting: Neurology

## 2014-04-26 DIAGNOSIS — E669 Obesity, unspecified: Secondary | ICD-10-CM

## 2014-04-26 DIAGNOSIS — G4733 Obstructive sleep apnea (adult) (pediatric): Secondary | ICD-10-CM

## 2014-04-26 DIAGNOSIS — G4750 Parasomnia, unspecified: Secondary | ICD-10-CM

## 2014-04-27 ENCOUNTER — Other Ambulatory Visit: Payer: Medicare Other

## 2014-05-02 ENCOUNTER — Ambulatory Visit: Payer: Medicare Other

## 2014-05-02 ENCOUNTER — Telehealth: Payer: Self-pay | Admitting: Pharmacist

## 2014-05-02 ENCOUNTER — Other Ambulatory Visit: Payer: Medicare Other

## 2014-05-02 NOTE — Telephone Encounter (Signed)
Pt FTKA today for lab/CC. I s/w pts wife over phone.  Pt is at [place] at his dtrs.  He will go Wed/Thurs this week to [facility] for lab draw. I contacted lab there (ph# [phone]; prompt 7) and s/w Keisha.  She reported that pts labs had expired so I faxed her new orders for PRN PT/INR x 1 year (fax [phone]).  They will fax [place] results when pt comes & once we get the results, we will need to call pt. Kennith Center, Pharm.D., CPP 05/02/2014@3 :03 PM

## 2014-05-05 NOTE — Telephone Encounter (Signed)
Encounter was telephone call. 

## 2014-05-06 ENCOUNTER — Other Ambulatory Visit: Payer: Self-pay | Admitting: *Deleted

## 2014-05-06 ENCOUNTER — Ambulatory Visit (INDEPENDENT_AMBULATORY_CARE_PROVIDER_SITE_OTHER): Payer: Self-pay | Admitting: Pharmacist

## 2014-05-06 DIAGNOSIS — I2699 Other pulmonary embolism without acute cor pulmonale: Secondary | ICD-10-CM

## 2014-05-06 DIAGNOSIS — R1032 Left lower quadrant pain: Secondary | ICD-10-CM

## 2014-05-06 LAB — POCT INR: INR: 2.3

## 2014-05-06 NOTE — Progress Notes (Signed)
INR = 2.3      Goal 2-3 INR is within goal range. Labs were drawn at [facility] in [place], [place]. Patient is visiting his daughters. I called and spoke with [name]. [name] is scheduled to have a colonoscopy sometime in September; she is unsure of the date. She is currently leaving for an MD appt and does not have time to call and check on the dates at this time. He will continue Coumadin 7.5mg  daily except 10mg  on Thurs & Sundays.  She will speak with [name] and call us back on Monday 8/31. Once we have the dates for the colonoscopy, we will check with Dr. [name] for instructions on Coumadin dosing around the procedure. We will also schedule his next appt at that time.  [name], PharmD  NO CHARGE - TELEPHONE ENCOUNTER ONLY

## 2014-05-09 ENCOUNTER — Telehealth: Payer: Self-pay | Admitting: Pharmacist

## 2014-05-09 NOTE — Telephone Encounter (Signed)
Called [phone] left VM. Asked [name] [name] to call us call back with the date of colonoscopy and to schedule a return INR check.

## 2014-05-10 ENCOUNTER — Telehealth: Payer: Self-pay | Admitting: Pharmacist

## 2014-05-10 NOTE — Telephone Encounter (Addendum)
Pt called and stated his Colonoscopy is scheduled for 05/18/14. They would like him to hold coumadin for 5 days prior to the procedure. Pt wanted to pick up his Lovenox injections. Provided 100mg  syringes (x 8) and 80mg  syringes (x 8) to be picked up at front desk. Will confirm anticoagulation plan with MD. Will notify patient once plan has been discussed with Dr. [name] and set up return INR appt.

## 2014-05-11 ENCOUNTER — Telehealth: Payer: Self-pay | Admitting: Neurology

## 2014-05-11 ENCOUNTER — Other Ambulatory Visit: Payer: Self-pay | Admitting: Internal Medicine

## 2014-05-11 ENCOUNTER — Encounter: Payer: Self-pay | Admitting: Pharmacist

## 2014-05-11 DIAGNOSIS — G4733 Obstructive sleep apnea (adult) (pediatric): Secondary | ICD-10-CM

## 2014-05-11 NOTE — Telephone Encounter (Signed)
Please call and inform patient that I have entered an order for treatment with PAP. He did reasonably well during the latest sleep study with CPAP. We will, therefore, arrange for a machine for home use through a DME (durable medical equipment) company of His choice; and I will see the patient back in follow-up in about 6 weeks. Please also explain to the patient that I will be looking out for compliance data downloaded from the machine, which can be done remotely through a modem at times or stored on an SD card in the back of the machine. At the time of the followup appointment we will discuss sleep study results and how it is going with PAP treatment at home. Please advise patient to bring His machine at the time of the visit; at least for the first visit, even though this is cumbersome. Bringing the machine for every visit after that may not be needed, but often helps for the first visit. Please also make sure, the patient has a follow-up appointment with me in about 6 weeks from the setup date, thanks.   Shuronda Santino, MD, PhD [facility] ([facility])  

## 2014-05-11 NOTE — Progress Notes (Signed)
Anticoagulation plan for colonoscopy scheduled on 05/18/14.  Spoke to [name] [name] in [name] [name] absence.  [name] [name] was transferred back to [name] [name] care by [name] [name].  Stop Coumadin 05/13/14 (5 days prior to colonoscopy) Resume Coumadin 7.5mg  daily but 10mg  on Thurs/Sun unless otherwise instructed by MD performing colonoscopy. Start Lovenox 180mg  injection daily on 05/19/14 Check PT/INR on 05/23/14 (appt needs to be scheduled)  I spoke to [name] [name] wife.  He is out of town today.  Will call [place] tomorrow for these instructions.

## 2014-05-12 ENCOUNTER — Telehealth: Payer: Self-pay | Admitting: Pharmacist

## 2014-05-12 NOTE — Telephone Encounter (Signed)
Confirmed anticoagulation plan for colonoscopy on 05/18/14 with patient. Patient stated understanding.  He will return on 05/23/14: Lab at 11am and CC at 11:15am for f/u labs.

## 2014-05-13 ENCOUNTER — Encounter: Payer: Self-pay | Admitting: Neurology

## 2014-05-13 NOTE — Telephone Encounter (Signed)
I called the patient regarding the results from his CPAP titration sleep study.  He is aware that his obstructive sleep apnea was successfully treated with CPAP.  An order for him to begin CPAP therapy will be sent to [facility] for processing.  The referring provider will be sent a copy of the sleep study report.  The patient will receive his sleep study report via mail.

## 2014-05-18 ENCOUNTER — Encounter: Payer: Self-pay | Admitting: Gastroenterology

## 2014-05-18 ENCOUNTER — Ambulatory Visit (AMBULATORY_SURGERY_CENTER): Payer: Medicare Other | Admitting: Gastroenterology

## 2014-05-18 VITALS — BP 111/78 | HR 88 | Temp 99.0°F | Resp 12 | Ht 76.0 in | Wt 253.0 lb

## 2014-05-18 DIAGNOSIS — K625 Hemorrhage of anus and rectum: Secondary | ICD-10-CM

## 2014-05-18 DIAGNOSIS — D126 Benign neoplasm of colon, unspecified: Secondary | ICD-10-CM

## 2014-05-18 DIAGNOSIS — D125 Benign neoplasm of sigmoid colon: Secondary | ICD-10-CM

## 2014-05-18 DIAGNOSIS — K648 Other hemorrhoids: Secondary | ICD-10-CM

## 2014-05-18 MED ORDER — SODIUM CHLORIDE 0.9 % IV SOLN: 500.0000 mL | INTRAVENOUS | Status: DC

## 2014-05-18 NOTE — Op Note (Signed)
[place]  [address] [place], [id number]   COLONOSCOPY PROCEDURE REPORT  PATIENT: [name], [name]  MR#: [id number] BIRTHDATE: Aug 27, 1951 , 63  yrs. old GENDER: Male ENDOSCOPIST: [name], MD REFERRED BY: PROCEDURE DATE:  05/18/2014 PROCEDURE:   Colonoscopy with snare polypectomy First Screening Colonoscopy - Avg.  risk and is 50 yrs.  old or older - No.  Prior Negative Screening - Now for repeat screening. Other: See Comments  History of Adenoma - Now for follow-up colonoscopy & has been > or = to 3 yrs.  N/A  Polyps Removed Today? Yes. ASA CLASS:   Class II INDICATIONS:Rectal Bleeding. MEDICATIONS: MAC sedation, administered by CRNA and Propofol (Diprivan) 280 mg IV  DESCRIPTION OF PROCEDURE:   After the risks benefits and alternatives of the procedure were thoroughly explained, informed consent was obtained.  A digital rectal exam revealed no abnormalities of the rectum.   The LB [id number]  endoscope was introduced through the anus and advanced to the cecum, which was identified by both the appendix and ileocecal valve. No adverse events experienced.   The quality of the prep was excellent using Suprep  The instrument was then slowly withdrawn as the colon was fully examined.      COLON FINDINGS: A sessile polyp measuring 4 mm in size was found in the sigmoid colon.  A polypectomy was performed with a cold snare. The resection was complete and the polyp tissue was completely retrieved.   Internal hemorrhoids were found.   The colon was otherwise normal.  There was no diverticulosis, inflammation, polyps or cancers unless previously stated.  Retroflexed views revealed no abnormalities. The time to cecum=2 minutes 12 seconds. Withdrawal time=11 minutes 24 seconds.  The scope was withdrawn and the procedure completed. COMPLICATIONS: There were no complications.  ENDOSCOPIC IMPRESSION: 1.   Sessile polyp measuring 4 mm in size was found  in the sigmoid colon; polypectomy was performed with a cold snare 2.   Internal hemorrhoids 3.   The colon was otherwise normal  Bleeding most likely secondary to hemorrhoids  RECOMMENDATIONS: 1.  If the polyp(s) removed today are proven to be adenomatous (pre-cancerous) polyps, you will need a repeat colonoscopy in 5 years.  Otherwise you should continue to follow colorectal cancer screening guidelines for "routine risk" patients with colonoscopy in 10 years.  You will receive a letter within 1-2 weeks with the results of your biopsy as well as final recommendations.  Please call my office if you have not received a letter after 3 weeks. 2.  resume Lovenox and Coumadin per cardiology 3.  hemorrhoidal suppositories as needed   eSigned:  [name], MD 05/18/2014 3:48 PM   cc: Jossie Ng, MD   PATIENT NAME:  [name] [name], [name] [name] MR#: [id number]

## 2014-05-18 NOTE — Progress Notes (Signed)
Report to PACU, RN, vss, BBS= Clear.  

## 2014-05-18 NOTE — Patient Instructions (Addendum)
Start Lovenox 180mg  injection daily on 05/19/14  Check PT/INR on 05/23/14 (appt needs to be scheduled YOU HAD AN ENDOSCOPIC PROCEDURE TODAY AT [place]: Refer to the procedure report that was given to you for any specific questions about what was found during the examination.  If the procedure report does not answer your questions, please call your gastroenterologist to clarify.  If you requested that your care partner not be given the details of your procedure findings, then the procedure report has been included in a sealed envelope for you to review at your convenience later.  YOU SHOULD EXPECT: Some feelings of bloating in the abdomen. Passage of more gas than usual.  Walking can help get rid of the air that was put into your GI tract during the procedure and reduce the bloating. If you had a lower endoscopy (such as a colonoscopy or flexible sigmoidoscopy) you may notice spotting of blood in your stool or on the toilet paper. If you underwent a bowel prep for your procedure, then you may not have a normal bowel movement for a few days.  DIET: Your first meal following the procedure should be a light meal and then it is ok to progress to your normal diet.  A half-sandwich or bowl of soup is an example of a good first meal.  Heavy or fried foods are harder to digest and may make you feel nauseous or bloated.  Likewise meals heavy in dairy and vegetables can cause extra gas to form and this can also increase the bloating.  Drink plenty of fluids but you should avoid alcoholic beverages for 24 hours.  ACTIVITY: Your care partner should take you home directly after the procedure.  You should plan to take it easy, moving slowly for the rest of the day.  You can resume normal activity the day after the procedure however you should NOT DRIVE or use heavy machinery for 24 hours (because of the sedation medicines used during the test).    SYMPTOMS TO REPORT IMMEDIATELY: A gastroenterologist  can be reached at any hour.  During normal business hours, 8:30 AM to 5:00 PM Monday through Friday, call [phone].  After hours and on weekends, please call the GI answering service at [phone] who will take a message and have the physician on call contact you.   Following lower endoscopy (colonoscopy or flexible sigmoidoscopy):  Excessive amounts of blood in the stool  Significant tenderness or worsening of abdominal pains  Swelling of the abdomen that is new, acute  Fever of 100F or higher    FOLLOW UP: If any biopsies were taken you will be contacted by phone or by letter within the next 1-3 weeks.  Call your gastroenterologist if you have not heard about the biopsies in 3 weeks.  Our staff will call the home number listed on your records the next business day following your procedure to check on you and address any questions or concerns that you may have at that time regarding the information given to you following your procedure. This is a courtesy call and so if there is no answer at the home number and we have not heard from you through the emergency physician on call, we will assume that you have returned to your regular daily activities without incident.  SIGNATURES/CONFIDENTIALITY: You and/or your care partner have signed paperwork which will be entered into your electronic medical record.  These signatures attest to the fact that that the information above  on your After Visit Summary has been reviewed and is understood.  Full responsibility of the confidentiality of this discharge information lies with you and/or your care-partner.  Polyp and hemorrhoid information given.  Restart Lovenox and coumadin per cardiology.

## 2014-05-18 NOTE — Progress Notes (Signed)
Called to room to assist during endoscopic procedure.  Patient ID and intended procedure confirmed with present staff. Received instructions for my participation in the procedure from the performing physician.  

## 2014-05-19 ENCOUNTER — Telehealth: Payer: Self-pay | Admitting: *Deleted

## 2014-05-19 NOTE — Telephone Encounter (Signed)
  Follow up Call-  Call back number 05/18/2014  Post procedure Call Back phone  # [phone]  Permission to leave phone message Yes     No answer at # given.  Left message on voicemail.

## 2014-05-23 ENCOUNTER — Ambulatory Visit (HOSPITAL_BASED_OUTPATIENT_CLINIC_OR_DEPARTMENT_OTHER): Payer: Medicare Other | Admitting: Pharmacist

## 2014-05-23 ENCOUNTER — Other Ambulatory Visit (HOSPITAL_BASED_OUTPATIENT_CLINIC_OR_DEPARTMENT_OTHER): Payer: Medicare Other

## 2014-05-23 DIAGNOSIS — I2699 Other pulmonary embolism without acute cor pulmonale: Secondary | ICD-10-CM

## 2014-05-23 DIAGNOSIS — Z7901 Long term (current) use of anticoagulants: Secondary | ICD-10-CM

## 2014-05-23 LAB — PROTIME-INR
INR: 1.7 — ABNORMAL LOW (ref 2.00–3.50)
Protime: 20.4 Seconds — ABNORMAL HIGH (ref 10.6–13.4)

## 2014-05-23 LAB — POCT INR: INR: 1.7

## 2014-05-23 NOTE — Progress Notes (Signed)
**  No charge pt** Pt of Dr Beryle Beams coumadin clinic following.  INR below goal of 2-3.  Mr [name] [name] had colonoscopy last week and coumadin was stopped for this procedure.  He resumed coumadin at usual dose of 7.5mg  daily but 10mg  Th/Sun on night of procedure 05/18/14.  Mr [name] [name] is also self administering Lovenox 180mg  daily since 05/19/14.  He has started taking 2 supplements.  The first contains fenugreek seed.   This may "slow blood clotting" but should be fine to use with coumadin.  Mr [name] [name] knows there is an increased risk of bleeding with this product.  The other supplement is [employer] Pro[employer] which contains Saw Palmetto and Yohimbe bark.  Both of these ingredients can also "slow blood clotting".  Yohimbe bark can also increase BP.  I told Mr [name] [name] this product is probably not safe to take.  Mr [name] [name] has appt with PCP today who manages his BP and he will discuss with this MD.   Anticoagulation plan is to take coumadin 10mg  today and tomorrow, then resume usual coumadin dose of 7.5mg  daily and 10mg  Th/Sun.  Will continue with last 2 doses of Lovenox Mr [name] [name] has then stop Lovenox.  Will check PT/INR next week to ensure INR back at goal.  Mr [name] [name] has been taking current coumadin dose since 09/2013.

## 2014-05-24 ENCOUNTER — Encounter: Payer: Self-pay | Admitting: Gastroenterology

## 2014-06-01 ENCOUNTER — Telehealth: Payer: Self-pay | Admitting: Pharmacist

## 2014-06-01 ENCOUNTER — Other Ambulatory Visit: Payer: Medicare Other

## 2014-06-01 ENCOUNTER — Ambulatory Visit: Payer: Medicare Other

## 2014-06-01 NOTE — Telephone Encounter (Signed)
Pt FTKA today for lab and coumadin clinic. Pt up in [place] and unable to make appointment today. Pt will be back later this week. Appointments rescheduled for Friday 06/03/14 at 11:30am for lab and 11:45am for coumadin clinic.  Thank you,  Montel Clock, PharmD, BCOP

## 2014-06-03 ENCOUNTER — Other Ambulatory Visit: Payer: Medicare Other

## 2014-06-03 ENCOUNTER — Ambulatory Visit: Payer: Medicare Other

## 2014-06-03 ENCOUNTER — Telehealth: Payer: Self-pay | Admitting: Pharmacist

## 2014-06-03 NOTE — Telephone Encounter (Signed)
FTKA for Coumadin clinic due to being in [place].  He will be back in [place] tomorrow.  He resched to Tues (9/29). Kennith Center, Pharm.D., CPP 06/03/2014@4 :24 PM

## 2014-06-07 ENCOUNTER — Ambulatory Visit: Payer: Medicare Other

## 2014-06-07 ENCOUNTER — Other Ambulatory Visit: Payer: Medicare Other

## 2014-06-09 ENCOUNTER — Other Ambulatory Visit (HOSPITAL_BASED_OUTPATIENT_CLINIC_OR_DEPARTMENT_OTHER): Payer: Medicare Other

## 2014-06-09 ENCOUNTER — Ambulatory Visit (HOSPITAL_BASED_OUTPATIENT_CLINIC_OR_DEPARTMENT_OTHER): Payer: Self-pay | Admitting: Pharmacist

## 2014-06-09 DIAGNOSIS — I2699 Other pulmonary embolism without acute cor pulmonale: Secondary | ICD-10-CM

## 2014-06-09 DIAGNOSIS — Z86718 Personal history of other venous thrombosis and embolism: Secondary | ICD-10-CM

## 2014-06-09 DIAGNOSIS — Z7901 Long term (current) use of anticoagulants: Secondary | ICD-10-CM

## 2014-06-09 DIAGNOSIS — Z86711 Personal history of pulmonary embolism: Secondary | ICD-10-CM

## 2014-06-09 LAB — PROTIME-INR
INR: 3 (ref 2.00–3.50)
Protime: 36 Seconds — ABNORMAL HIGH (ref 10.6–13.4)

## 2014-06-09 LAB — POCT INR: INR: 3

## 2014-06-09 NOTE — Patient Instructions (Addendum)
INR at goal no changes  continue coumadin 7.5mg  daily except 10mg  on Thurs & Sundays.    Will check PT/INR on 06/30/14 at 1pm for lab and 1:15pm for coumadin clinic.

## 2014-06-09 NOTE — Progress Notes (Signed)
INR at goal *No charge coumadin clinic patient* Pt is doing well today Only complaint is that he started a new medication last month and shortly after had 2 "seizure like" episodes He does not remember what the medication was but he is no longer taking this and has follow up with his primary He has not had any events in the last few weeks since stopping this med Pt is off lovenox now No other issues or complaints No unusual bleeding or bruising No other medication or diet changes Plan: continue coumadin 7.5mg  daily except 10mg  on Thurs & Sundays.   Will check PT/INR on 06/30/14 at 1pm for lab and 1:15pm for coumadin clinic.  Coumadin samples  1 mg x 20 tabs (at patient request for 1 mg tablets) LOT: [id number] EXP: 8/16

## 2014-06-13 ENCOUNTER — Telehealth: Payer: Self-pay

## 2014-06-13 NOTE — Telephone Encounter (Signed)
Contacted patient unable to reach him left voicemail with his DME information per his request. [facility] [phone]

## 2014-06-30 ENCOUNTER — Telehealth: Payer: Self-pay | Admitting: Pharmacist

## 2014-06-30 ENCOUNTER — Ambulatory Visit: Payer: Medicare Other

## 2014-06-30 ENCOUNTER — Other Ambulatory Visit: Payer: Medicare Other

## 2014-06-30 NOTE — Telephone Encounter (Signed)
[name] [name] called the clinic this morning stating that he had a fall this weekend and hurt his back and now has driving restrictions. He is not cleared to drive so will need to reschedule his coumadin clinic appointment from today until tomorrow on Friday, 07/01/14 at 1pm. His daughter will be able to bring him.  Thank you, Montel Clock, Pharm.D., BCOP

## 2014-07-01 ENCOUNTER — Other Ambulatory Visit (HOSPITAL_BASED_OUTPATIENT_CLINIC_OR_DEPARTMENT_OTHER): Payer: Medicare Other

## 2014-07-01 ENCOUNTER — Ambulatory Visit (HOSPITAL_BASED_OUTPATIENT_CLINIC_OR_DEPARTMENT_OTHER): Payer: Self-pay | Admitting: Pharmacist

## 2014-07-01 DIAGNOSIS — I2699 Other pulmonary embolism without acute cor pulmonale: Secondary | ICD-10-CM

## 2014-07-01 DIAGNOSIS — Z7901 Long term (current) use of anticoagulants: Secondary | ICD-10-CM

## 2014-07-01 LAB — PROTIME-INR
INR: 2.9 (ref 2.00–3.50)
Protime: 34.8 Seconds — ABNORMAL HIGH (ref 10.6–13.4)

## 2014-07-01 LAB — POCT INR: INR: 2.9

## 2014-07-01 NOTE — Progress Notes (Signed)
NO CHARGE PATIENT  INR = 2.9   Goal 2-3 INR = 2.9 INR within goal range. No complications of anticoagulation noted. Patient has started 2 new medications:  Diovan 320/25mg  daily and Zanafelx 4 mg q6hprn. Patient continues to "pass out" and has had more seizure-like episodes. He lost consciousness and fell in the floor this week. He is currently seeing multiple doctors and is determined to find out what is causing this. He is wearing a holter monitor today and cardiology will have the results in a few days. He will continue coumadin 7.5mg  daily except 10mg  on Thurs & Sundays. We will check PT/INR on 07/29/14 at 1pm for lab and 1:15pm for Coumadin clinic.  [name], PharmD

## 2014-07-25 ENCOUNTER — Telehealth: Payer: Self-pay | Admitting: Pharmacist

## 2014-07-25 ENCOUNTER — Ambulatory Visit (HOSPITAL_BASED_OUTPATIENT_CLINIC_OR_DEPARTMENT_OTHER): Payer: Medicare Other

## 2014-07-25 ENCOUNTER — Ambulatory Visit: Payer: Medicare Other

## 2014-07-25 DIAGNOSIS — I2699 Other pulmonary embolism without acute cor pulmonale: Secondary | ICD-10-CM

## 2014-07-25 DIAGNOSIS — Z7901 Long term (current) use of anticoagulants: Secondary | ICD-10-CM

## 2014-07-25 LAB — POCT INR: INR: 3.1

## 2014-07-25 LAB — PROTIME-INR
INR: 3.1 (ref 2.00–3.50)
Protime: 37.2 Seconds — ABNORMAL HIGH (ref 10.6–13.4)

## 2014-07-25 NOTE — Telephone Encounter (Signed)
Patient called and said he has bruising and would like to have his INR checked today, his appt is for Friday. We will see him in the Coumadin clinic today. Lab at 2pm, Coumadin clinic at 2:15.

## 2014-07-25 NOTE — Progress Notes (Signed)
*  No charge patient*  [name] [name] called to be seen today as he complained of bruising and recent bleeding, and wanted to have his INR checked. His INR today was 3.1, which is just slightly above his goal range of 2-3. He endorsed a bruise on his abdomen and forearm, and says he has noticed speckles of blood after blowing his nose, as well as blood in his stool once last week. He has been feeling poorly, and has had abdominal pain. He also endorses neck and chest pain, and says he has had drenching night sweats. [name] [name] also says he can "feel his IVC filter in his chest". He has had some medication changes, as his cardiologist has changed his blood pressure medications around; he is no longer taking HCTZ, fluticasone, gabapentin, lisinopril, metoprolol, or omeprazole. He does use Voltaren gel, which as an NSAID, can increase the risk of bleeding.  As described in past anticoagulation visit notes, he has also had some episodes of "passing out", and says a brain MRI was discussed, but cannot be done due to the nerve stimulator in his back. He does state that he continues to smoke, and we discussed smoking cessation though he states he is not ready to quit.  Anticoagulation plan: Continue coumadin 7.5 mg daily except 10 mg on Thursdays and Sundays. Will check PT/INR on 08/15/14 at 1:30pm for lab and 1:45pm for Coumadin clinic.  For his other complaints, I recommended he go to the emergency room so scans/extra bloodwork can be done promptly to see if there is an underlying issue. He verbalized his understanding of this plan, and stated he would go to the ED to be evaluated.

## 2014-07-25 NOTE — Patient Instructions (Addendum)
Continue coumadin 7.5mg  daily except 10mg  on Thurs & Sundays.    Will check PT/INR on 08/15/14 at 1:30pm for lab and 1:45pm for Coumadin clinic. Recommend going to the emergency department for prompt evaluation of your symptoms to ensure no other underlying issue is present

## 2014-07-26 ENCOUNTER — Emergency Department (HOSPITAL_COMMUNITY): Payer: Medicare Other

## 2014-07-26 ENCOUNTER — Telehealth: Payer: Self-pay | Admitting: Pharmacist

## 2014-07-26 ENCOUNTER — Other Ambulatory Visit: Payer: Self-pay

## 2014-07-26 ENCOUNTER — Encounter (HOSPITAL_COMMUNITY): Payer: Self-pay | Admitting: Emergency Medicine

## 2014-07-26 ENCOUNTER — Emergency Department (HOSPITAL_COMMUNITY)
Admission: EM | Admit: 2014-07-26 | Discharge: 2014-07-26 | Disposition: A | Discharge status: Home / Self Care | Payer: Medicare Other | Attending: Emergency Medicine | Admitting: Emergency Medicine

## 2014-07-26 DIAGNOSIS — F419 Anxiety disorder, unspecified: Secondary | ICD-10-CM | POA: Diagnosis not present

## 2014-07-26 DIAGNOSIS — R1011 Right upper quadrant pain: Secondary | ICD-10-CM | POA: Insufficient documentation

## 2014-07-26 DIAGNOSIS — Z86718 Personal history of other venous thrombosis and embolism: Secondary | ICD-10-CM | POA: Diagnosis not present

## 2014-07-26 DIAGNOSIS — F329 Major depressive disorder, single episode, unspecified: Secondary | ICD-10-CM | POA: Diagnosis not present

## 2014-07-26 DIAGNOSIS — Z720 Tobacco use: Secondary | ICD-10-CM | POA: Insufficient documentation

## 2014-07-26 DIAGNOSIS — Z8719 Personal history of other diseases of the digestive system: Secondary | ICD-10-CM | POA: Insufficient documentation

## 2014-07-26 DIAGNOSIS — I10 Essential (primary) hypertension: Secondary | ICD-10-CM | POA: Insufficient documentation

## 2014-07-26 DIAGNOSIS — R519 Headache, unspecified: Secondary | ICD-10-CM

## 2014-07-26 DIAGNOSIS — Z7901 Long term (current) use of anticoagulants: Secondary | ICD-10-CM | POA: Diagnosis not present

## 2014-07-26 DIAGNOSIS — R110 Nausea: Secondary | ICD-10-CM

## 2014-07-26 DIAGNOSIS — Z9889 Other specified postprocedural states: Secondary | ICD-10-CM | POA: Diagnosis not present

## 2014-07-26 DIAGNOSIS — R195 Other fecal abnormalities: Secondary | ICD-10-CM | POA: Diagnosis not present

## 2014-07-26 DIAGNOSIS — R51 Headache: Secondary | ICD-10-CM | POA: Insufficient documentation

## 2014-07-26 DIAGNOSIS — G629 Polyneuropathy, unspecified: Secondary | ICD-10-CM | POA: Diagnosis not present

## 2014-07-26 DIAGNOSIS — R1031 Right lower quadrant pain: Secondary | ICD-10-CM | POA: Insufficient documentation

## 2014-07-26 DIAGNOSIS — Z79899 Other long term (current) drug therapy: Secondary | ICD-10-CM | POA: Diagnosis not present

## 2014-07-26 DIAGNOSIS — Z8619 Personal history of other infectious and parasitic diseases: Secondary | ICD-10-CM | POA: Insufficient documentation

## 2014-07-26 DIAGNOSIS — R109 Unspecified abdominal pain: Secondary | ICD-10-CM | POA: Diagnosis present

## 2014-07-26 DIAGNOSIS — R0602 Shortness of breath: Secondary | ICD-10-CM | POA: Insufficient documentation

## 2014-07-26 DIAGNOSIS — R197 Diarrhea, unspecified: Secondary | ICD-10-CM | POA: Insufficient documentation

## 2014-07-26 DIAGNOSIS — Z8739 Personal history of other diseases of the musculoskeletal system and connective tissue: Secondary | ICD-10-CM | POA: Insufficient documentation

## 2014-07-26 DIAGNOSIS — Z9049 Acquired absence of other specified parts of digestive tract: Secondary | ICD-10-CM | POA: Insufficient documentation

## 2014-07-26 LAB — CBC WITH DIFFERENTIAL/PLATELET
Basophils Absolute: 0 10*3/uL (ref 0.0–0.1)
Basophils Relative: 0 % (ref 0–1)
Eosinophils Absolute: 0 10*3/uL (ref 0.0–0.7)
Eosinophils Relative: 1 % (ref 0–5)
HCT: 40.5 % (ref 39.0–52.0)
Hemoglobin: 14 g/dL (ref 13.0–17.0)
Lymphocytes Relative: 40 % (ref 12–46)
Lymphs Abs: 1.7 10*3/uL (ref 0.7–4.0)
MCH: 23.6 pg — ABNORMAL LOW (ref 26.0–34.0)
MCHC: 34.6 g/dL (ref 30.0–36.0)
MCV: 68.4 fL — ABNORMAL LOW (ref 78.0–100.0)
Monocytes Absolute: 0.4 10*3/uL (ref 0.1–1.0)
Monocytes Relative: 10 % (ref 3–12)
Neutro Abs: 2.2 10*3/uL (ref 1.7–7.7)
Neutrophils Relative %: 49 % (ref 43–77)
Platelets: 231 10*3/uL (ref 150–400)
RBC: 5.92 MIL/uL — ABNORMAL HIGH (ref 4.22–5.81)
RDW: 15.6 % — ABNORMAL HIGH (ref 11.5–15.5)
WBC: 4.3 10*3/uL (ref 4.0–10.5)

## 2014-07-26 LAB — BASIC METABOLIC PANEL
Anion gap: 11 (ref 5–15)
BUN: 11 mg/dL (ref 6–23)
CO2: 27 mEq/L (ref 19–32)
Calcium: 10 mg/dL (ref 8.4–10.5)
Chloride: 98 mEq/L (ref 96–112)
Creatinine, Ser: 1 mg/dL (ref 0.50–1.35)
GFR calc Af Amer: 90 mL/min — ABNORMAL LOW (ref >90)
GFR calc non Af Amer: 78 mL/min — ABNORMAL LOW (ref >90)
Glucose, Bld: 100 mg/dL — ABNORMAL HIGH (ref 70–99)
Potassium: 4.2 mEq/L (ref 3.7–5.3)
Sodium: 136 mEq/L — ABNORMAL LOW (ref 137–147)

## 2014-07-26 LAB — I-STAT TROPONIN, ED: Troponin i, poc: 0 ng/mL (ref 0.00–0.08)

## 2014-07-26 LAB — URINALYSIS, ROUTINE W REFLEX MICROSCOPIC
Bilirubin Urine: NEGATIVE
Glucose, UA: NEGATIVE mg/dL
Hgb urine dipstick: NEGATIVE
Ketones, ur: NEGATIVE mg/dL
Leukocytes, UA: NEGATIVE
Nitrite: NEGATIVE
Protein, ur: NEGATIVE mg/dL
Specific Gravity, Urine: 1.02 (ref 1.005–1.030)
Urobilinogen, UA: 0.2 mg/dL (ref 0.0–1.0)
pH: 6 (ref 5.0–8.0)

## 2014-07-26 LAB — HEPATIC FUNCTION PANEL
ALT: 13 U/L (ref 0–53)
AST: 16 U/L (ref 0–37)
Albumin: 4.3 g/dL (ref 3.5–5.2)
Alkaline Phosphatase: 50 U/L (ref 39–117)
Bilirubin, Direct: <0.2 mg/dL (ref 0.0–0.3)
Total Bilirubin: 0.5 mg/dL (ref 0.3–1.2)
Total Protein: 7.4 g/dL (ref 6.0–8.3)

## 2014-07-26 LAB — LIPASE, BLOOD: Lipase: 20 U/L (ref 11–59)

## 2014-07-26 LAB — POC OCCULT BLOOD, ED: Fecal Occult Bld: NEGATIVE

## 2014-07-26 LAB — PROTIME-INR
INR: 1.83 — ABNORMAL HIGH (ref 0.00–1.49)
Prothrombin Time: 21.3 seconds — ABNORMAL HIGH (ref 11.6–15.2)

## 2014-07-26 MED ORDER — ONDANSETRON HCL 4 MG/2ML IJ SOLN
4.0000 mg | Freq: Once | INTRAMUSCULAR | Status: AC
  Administered 2014-07-26: 4 mg via INTRAVENOUS
  Filled 2014-07-26: qty 2

## 2014-07-26 MED ORDER — HYDROMORPHONE HCL 1 MG/ML IJ SOLN
1.0000 mg | Freq: Once | INTRAMUSCULAR | Status: AC
  Administered 2014-07-26: 1 mg via INTRAVENOUS
  Filled 2014-07-26: qty 1

## 2014-07-26 MED ORDER — ONDANSETRON 8 MG PO TBDP: 8.0000 mg | ORAL_TABLET | Freq: Three times a day (TID) | ORAL | Status: DC | PRN

## 2014-07-26 MED ORDER — HYDROMORPHONE HCL 1 MG/ML IJ SOLN
0.5000 mg | Freq: Once | INTRAMUSCULAR | Status: AC
  Administered 2014-07-26: 0.5 mg via INTRAVENOUS
  Filled 2014-07-26: qty 1

## 2014-07-26 NOTE — ED Notes (Signed)
Patient transported to X-ray 

## 2014-07-26 NOTE — ED Notes (Signed)
Pt from home with c/o sob and right flank pain x2 weeks, pt states sob increases with exertion. Denies any cp at this time, denies any urinary symptoms. Reports minor right leg swelling that has increased. Pt denies any n/v/d or fevers, axox 4. nad noted. Pt wearing holter monitor placed by cardiologist.

## 2014-07-26 NOTE — Discharge Instructions (Signed)
Continue all regular medications. Try miralax daily for constipation. zofran for nausea. Follow up with Dr. Jonelle Sidle for recheck and further evaluation.   Nausea and Vomiting Nausea is a sick feeling that often comes before throwing up (vomiting). Vomiting is a reflex where stomach contents come out of your mouth. Vomiting can cause severe loss of body fluids (dehydration). Children and elderly adults can become dehydrated quickly, especially if they also have diarrhea. Nausea and vomiting are symptoms of a condition or disease. It is important to find the cause of your symptoms. CAUSES   Direct irritation of the stomach lining. This irritation can result from increased acid production (gastroesophageal reflux disease), infection, food poisoning, taking certain medicines (such as nonsteroidal anti-inflammatory drugs), alcohol use, or tobacco use.  Signals from the brain.These signals could be caused by a headache, heat exposure, an inner ear disturbance, increased pressure in the brain from injury, infection, a tumor, or a concussion, pain, emotional stimulus, or metabolic problems.  An obstruction in the gastrointestinal tract (bowel obstruction).  Illnesses such as diabetes, hepatitis, gallbladder problems, appendicitis, kidney problems, cancer, sepsis, atypical symptoms of a heart attack, or eating disorders.  Medical treatments such as chemotherapy and radiation.  Receiving medicine that makes you sleep (general anesthetic) during surgery. DIAGNOSIS Your caregiver may ask for tests to be done if the problems do not improve after a few days. Tests may also be done if symptoms are severe or if the reason for the nausea and vomiting is not clear. Tests may include:  Urine tests.  Blood tests.  Stool tests.  Cultures (to look for evidence of infection).  X-rays or other imaging studies. Test results can help your caregiver make decisions about treatment or the need for additional  tests. TREATMENT You need to stay well hydrated. Drink frequently but in small amounts.You may wish to drink water, sports drinks, clear broth, or eat frozen ice pops or gelatin dessert to help stay hydrated.When you eat, eating slowly may help prevent nausea.There are also some antinausea medicines that may help prevent nausea. HOME CARE INSTRUCTIONS   Take all medicine as directed by your caregiver.  If you do not have an appetite, do not force yourself to eat. However, you must continue to drink fluids.  If you have an appetite, eat a normal diet unless your caregiver tells you differently.  Eat a variety of complex carbohydrates (rice, wheat, potatoes, bread), lean meats, yogurt, fruits, and vegetables.  Avoid high-fat foods because they are more difficult to digest.  Drink enough water and fluids to keep your urine clear or pale yellow.  If you are dehydrated, ask your caregiver for specific rehydration instructions. Signs of dehydration may include:  Severe thirst.  Dry lips and mouth.  Dizziness.  Dark urine.  Decreasing urine frequency and amount.  Confusion.  Rapid breathing or pulse. SEEK IMMEDIATE MEDICAL CARE IF:   You have blood or brown flecks (like coffee grounds) in your vomit.  You have black or bloody stools.  You have a severe headache or stiff neck.  You are confused.  You have severe abdominal pain.  You have chest pain or trouble breathing.  You do not urinate at least once every 8 hours.  You develop cold or clammy skin.  You continue to vomit for longer than 24 to 48 hours.  You have a fever. MAKE SURE YOU:   Understand these instructions.  Will watch your condition.  Will get help right away if you are not doing  well or get worse. Document Released: 08/26/2005 Document Revised: 11/18/2011 Document Reviewed: 01/23/2011 [facility] Patient Information 2015 [place], [place]. This information is not intended to replace advice given to  you by your health care provider. Make sure you discuss any questions you have with your health care provider.

## 2014-07-26 NOTE — ED Provider Notes (Signed)
CSN: [id number]     Arrival date & time 07/26/14  1330 History   First MD Initiated Contact with Patient 07/26/14 1429     Chief Complaint  Patient presents with  . Shortness of Breath  . Flank Pain     (Consider location/radiation/quality/duration/timing/severity/associated sxs/prior Treatment) HPI [name] [name] is a 63 y.o. male who presents to emergency department with multiple complaints. Patient's main complaint is right abdominal pain, nausea, vomiting, diarrhea. Also reports some shortness of breath, but states chronic. Denies any chest pain. States unable to eat or drink anything, without having diarrhea. Denies fevers or chills. Also reports headache, states it is chronic but worsening. Took 2-1/2 Percocets at home with no relief. Patient states he has been to his primary care doctor, last visited yesterday, states he was told to come here. Patient states he tasted blood in his mouth, and states he had a bright red blood clot per rectum 2 days ago. He said he has history of ulcers. Also states he has history of "ruptured esophagus." patient also states "I think I may also have a small bowel obstruction." Denies prior obstruction, but states that someone told him this one he may have.  Past Medical History  Diagnosis Date  . Hypertension   . History of blood clots   . Diverticulitis   . Hernia   . Diverticulosis   . Shingles   . Gout   . Back pain   . Neuropathy, peripheral 05/15/2012  . DVT (deep venous thrombosis)   . Anxiety and depression    Past Surgical History  Procedure Laterality Date  . Back surgery    . Hernia repair    . Hiatal hernia repair    . Appendectomy    . Cholecystectomy  2008  . Sacral nerve stimulator placement  2011   Family History  Problem Relation Age of Onset  . Prostate cancer Father   . Heart disease Father   . Diabetes Mother   . Heart disease Mother   . Colon cancer Maternal Uncle   . Pancreatic cancer Paternal Uncle   . Prostate  cancer Paternal Uncle   . Multiple sclerosis Daughter   . Prostate cancer Paternal Uncle    History  Substance Use Topics  . Smoking status: Current Every Day Smoker -- 2.00 packs/day for 40 years    Types: Cigarettes  . Smokeless tobacco: Never Used  . Alcohol Use: No    Review of Systems  Constitutional: Negative for fever and chills.  Respiratory: Negative for cough, chest tightness and shortness of breath.   Cardiovascular: Negative for chest pain, palpitations and leg swelling.  Gastrointestinal: Positive for nausea, vomiting, abdominal pain, diarrhea and blood in stool. Negative for abdominal distention.  Genitourinary: Negative for dysuria, urgency, frequency and hematuria.  Musculoskeletal: Negative for myalgias, arthralgias, neck pain and neck stiffness.  Skin: Negative for rash.  Allergic/Immunologic: Negative for immunocompromised state.  Neurological: Negative for dizziness, weakness, light-headedness, numbness and headaches.      Allergies  Aspirin; Celecoxib; Ibuprofen; and Lyrica  Home Medications   Prior to Admission medications   Medication Sig Start Date End Date Taking? Authorizing Provider  DULoxetine (CYMBALTA) 60 MG capsule Take 60 mg by mouth daily.     Historical Provider, MD  fluticasone Asencion Islam) 50 MCG/ACT nasal spray  04/13/14   Historical Provider, MD  gabapentin (NEURONTIN) 100 MG capsule  05/09/14   Historical Provider, MD  hydrALAZINE (APRESOLINE) 10 MG tablet Take 1 tablet by mouth 4 (  four) times daily. 01/10/14   Historical Provider, MD  hydrochlorothiazide (HYDRODIURIL) 25 MG tablet Take 25 mg by mouth daily.    Historical Provider, MD  HYDROcodone-acetaminophen (NORCO/VICODIN) 5-325 MG per tablet Take 1 tablet by mouth 2 (two) times daily as needed for moderate pain.  01/17/14   Historical Provider, MD  lisinopril (PRINIVIL,ZESTRIL) 40 MG tablet Take 1 tablet by mouth daily. 01/02/14   Historical Provider, MD  metoprolol (LOPRESSOR) 50 MG tablet  Take 1 tablet by mouth 2 (two) times daily. 12/06/13   Historical Provider, MD  NIFEDICAL XL 60 MG 24 hr tablet  02/21/14   Historical Provider, MD  omeprazole (PRILOSEC) 20 MG capsule Take 1 capsule (20 mg total) by mouth daily. 12/06/13   Curt Bears, MD  PROAIR HFA 108 [phone] BASE) MCG/ACT inhaler  03/23/14   Historical Provider, MD  promethazine (PHENERGAN) 25 MG tablet Take 1 tablet (25 mg total) by mouth every 8 (eight) hours as needed for nausea. 12/06/13   Curt Bears, MD  valsartan-hydrochlorothiazide (DIOVAN-HCT) 320-25 MG per tablet Take 1 tablet by mouth daily.    Historical Provider, MD  VOLTAREN 1 % GEL  02/23/14   Historical Provider, MD  warfarin (COUMADIN) 5 MG tablet Take 7.5 mg by mouth daily.    Historical Provider, MD  warfarin (COUMADIN) 5 MG tablet Take 10 mg by mouth daily.    Historical Provider, MD   BP 150/94 mmHg  Pulse 101  Temp(Src) 98 F (36.7 C) (Oral)  Resp 16  Ht 6\' 4"  (1.93 m)  Wt 246 lb (111.585 kg)  BMI 29.96 kg/m2  SpO2 100% Physical Exam  Constitutional: He is oriented to person, place, and time. He appears well-developed and well-nourished. No distress.  HENT:  Head: Normocephalic and atraumatic.  Eyes: Conjunctivae are normal.  Neck: Neck supple.  Cardiovascular: Normal rate, regular rhythm and normal heart sounds.   Pulmonary/Chest: Effort normal and breath sounds normal. No respiratory distress. He has no wheezes. He has no rales.  Abdominal: Soft. Bowel sounds are normal. He exhibits no distension. There is tenderness. There is no rebound.  RLQ and RUQ tenderness  Musculoskeletal: He exhibits no edema.  Neurological: He is alert and oriented to person, place, and time.  Skin: Skin is warm and dry.  Nursing note and vitals reviewed.   ED Course  Procedures (including critical care time) Labs Review Labs Reviewed  BASIC METABOLIC PANEL - Abnormal; Notable for the following:    Sodium 136 (*)    Glucose, Bld 100 (*)    GFR calc non Af  Amer 78 (*)    GFR calc Af Amer 90 (*)    All other components within normal limits  CBC WITH DIFFERENTIAL - Abnormal; Notable for the following:    RBC 5.92 (*)    MCV 68.4 (*)    MCH 23.6 (*)    RDW 15.6 (*)    All other components within normal limits  PROTIME-INR - Abnormal; Notable for the following:    Prothrombin Time 21.3 (*)    INR 1.83 (*)    All other components within normal limits  CLOSTRIDIUM DIFFICILE BY PCR  LIPASE, BLOOD  HEPATIC FUNCTION PANEL  URINALYSIS, ROUTINE W REFLEX MICROSCOPIC  I-STAT TROPOININ, ED  POC OCCULT BLOOD, ED    Imaging Review Dg Abd Acute W/chest  07/26/2014   CLINICAL DATA:  Right lower quadrant abdominal pain with alternating diarrhea and constipation. Nausea and vomiting for 2 months.  EXAM: ACUTE ABDOMEN SERIES (  ABDOMEN 2 VIEW & CHEST 1 VIEW)  COMPARISON:  Acute abdominal series 3 /12/2012  FINDINGS: The upright chest x-ray is unremarkable. No acute pulmonary findings. A spinal cord stimulator is noted in the mid thoracic spine. There is tortuosity of the thoracic aorta.  Two views of the abdomen demonstrate an unremarkable bowel gas pattern. There are scattered loops of small bowel with air and a few scattered air-fluid levels but no distention. The colon is grossly normal. The soft tissue shadows are maintained. No worrisome calcifications. Stable IVC filter. The bony structures are intact.  IMPRESSION: No acute cardiopulmonary findings.  No plain film findings for an acute abdominal process.   Electronically Signed   By: Kalman Jewels M.D.   On: 07/26/2014 16:37     EKG Interpretation   Date/Time:  Tuesday July 26 2014 13:41:12 EST Ventricular Rate:  102 PR Interval:  138 QRS Duration: 94 QT Interval:  342 QTC Calculation: 445 R Axis:   -55 Text Interpretation:  Sinus tachycardia Left anterior fascicular block  Abnormal ECG Confirmed by Jeneen Rinks  MD, [place] ([id number]) on 07/26/2014 3:44:02 PM      MDM   Final diagnoses:   Abdominal pain  Nausea  Nonintractable headache, unspecified chronicity pattern, unspecified headache type    Pt with multiple complaints, mainly complaining of abdominal pain, nausea, vomiting, blood "in mouth" and per rectum. Will get hemoccult, labs, actute abd series. Pain medications ordered. Will monitor.   4:56 PM Pt denies any current sob or chest pain. Hx of DVTs and PE, on coumadin and has IVC filter. He is not hypoxic or tachycardic. PE very unlikely. INR 1.8. Pt's hemoccult is negative. His VS normal other than hypertension. Given dilaudid and zofran for his pain and nausea. Pt currently on holter monitor, being evaluated for dizziness by his cardiologist. His labs all unremrkable. His last colonscopy on 9/15 showed hemorrhoids and a polyp which was removed. H&H stable. I think at this time patient is stable for discharge home with outpatient follow-up and further testing. Return precautions discussed   Filed Vitals:   07/26/14 1351 07/26/14 1530 07/26/14 1700  BP: 150/94 146/110 148/88  Pulse: 101 89 82  Temp: 98 F (36.7 C)    TempSrc: Oral    Resp: 16 19 14   Height: 6\' 4"  (1.93 m)    Weight: 246 lb (111.585 kg)    SpO2: 100% 100% 98%       Renold Genta, PA-C 07/27/14 [place], MD 08/03/14 1349

## 2014-07-26 NOTE — Telephone Encounter (Signed)
No ED encounter in chart so call made to Mrs. Magistro to follow up. She states he did not go to ED, instead he made an appt to see his PCP (Dr. Jonelle Sidle) at noon today. She will call the [facility] and let us know the outcome of that visit. He has an appt in the Coumadin clinic on 11/20.

## 2014-07-29 ENCOUNTER — Other Ambulatory Visit: Payer: Medicare Other

## 2014-07-29 ENCOUNTER — Telehealth: Payer: Self-pay | Admitting: Pharmacist

## 2014-07-29 ENCOUNTER — Ambulatory Visit: Payer: Medicare Other

## 2014-07-29 NOTE — Telephone Encounter (Signed)
Patient FTKA today. Called and spoke with him. He saw Dr. Einar Gip today and will be having cardiac cath on Tues 08/02/14. Dr. Einar Gip stopped his Coumadin today and it will be on hold until after the procedure. He will contact us after the cath to make an appt for the Coumadin clinic. Dr. Tommi Rumps would like to give him some type of injection for headache.  He is unsure what this is, but wants to check with [place] prior to beginning the injections. He would not start this until after cath.  [name] would like [name] [name] to make an appt to see him. He is to call Adria Dill at [phone] to schedule this. He would also like [name] [name] to consider changing to Xarelto. It may be possible to get help from the Xarelto rep to make this affordable for him.

## 2014-08-01 DIAGNOSIS — I209 Angina pectoris, unspecified: Secondary | ICD-10-CM

## 2014-08-02 ENCOUNTER — Encounter (HOSPITAL_COMMUNITY)
Admission: RE | Disposition: A | Discharge status: Home / Self Care | Payer: Self-pay | Source: Ambulatory Visit | Attending: Cardiology

## 2014-08-02 ENCOUNTER — Ambulatory Visit (HOSPITAL_COMMUNITY)
Admission: RE | Admit: 2014-08-02 | Discharge: 2014-08-02 | Disposition: A | Discharge status: Home / Self Care | Payer: Medicare Other | Source: Ambulatory Visit | Attending: Cardiology | Admitting: Cardiology

## 2014-08-02 DIAGNOSIS — K219 Gastro-esophageal reflux disease without esophagitis: Secondary | ICD-10-CM | POA: Diagnosis not present

## 2014-08-02 DIAGNOSIS — I10 Essential (primary) hypertension: Secondary | ICD-10-CM | POA: Diagnosis not present

## 2014-08-02 DIAGNOSIS — R079 Chest pain, unspecified: Secondary | ICD-10-CM | POA: Insufficient documentation

## 2014-08-02 DIAGNOSIS — I209 Angina pectoris, unspecified: Secondary | ICD-10-CM

## 2014-08-02 DIAGNOSIS — Z7901 Long term (current) use of anticoagulants: Secondary | ICD-10-CM | POA: Diagnosis not present

## 2014-08-02 HISTORY — PX: LEFT HEART CATHETERIZATION WITH CORONARY ANGIOGRAM: SHX5451

## 2014-08-02 LAB — PROTIME-INR
INR: 1.28 (ref 0.00–1.49)
Prothrombin Time: 16.1 seconds — ABNORMAL HIGH (ref 11.6–15.2)

## 2014-08-02 SURGERY — LEFT HEART CATHETERIZATION WITH CORONARY ANGIOGRAM
Anesthesia: LOCAL

## 2014-08-02 MED ORDER — SODIUM CHLORIDE 0.9 % IV BOLUS (SEPSIS)
500.0000 mL | Freq: Once | INTRAVENOUS | Status: AC
  Administered 2014-08-02: 10:00:00 via INTRAVENOUS

## 2014-08-02 MED ORDER — NITROGLYCERIN 1 MG/10 ML FOR IR/CATH LAB
INTRA_ARTERIAL | Status: AC
  Filled 2014-08-02: qty 10

## 2014-08-02 MED ORDER — HYDROMORPHONE HCL 1 MG/ML IJ SOLN
INTRAMUSCULAR | Status: AC
  Filled 2014-08-02: qty 1

## 2014-08-02 MED ORDER — HEPARIN (PORCINE) IN NACL 2-0.9 UNIT/ML-% IJ SOLN
INTRAMUSCULAR | Status: AC
  Filled 2014-08-02: qty 1000

## 2014-08-02 MED ORDER — SODIUM CHLORIDE 0.9 % IV SOLN
INTRAVENOUS | Status: DC
  Administered 2014-08-02: 500 mL via INTRAVENOUS

## 2014-08-02 MED ORDER — VERAPAMIL HCL 2.5 MG/ML IV SOLN
INTRAVENOUS | Status: AC
  Filled 2014-08-02: qty 2

## 2014-08-02 MED ORDER — SODIUM CHLORIDE 0.9 % IV SOLN: 1.0000 mL/kg/h | INTRAVENOUS | Status: DC

## 2014-08-02 MED ORDER — SODIUM CHLORIDE 0.9 % IJ SOLN: 3.0000 mL | Freq: Two times a day (BID) | INTRAMUSCULAR | Status: DC

## 2014-08-02 MED ORDER — LIDOCAINE HCL (PF) 1 % IJ SOLN
INTRAMUSCULAR | Status: AC
  Filled 2014-08-02: qty 30

## 2014-08-02 MED ORDER — SODIUM CHLORIDE 0.9 % IJ SOLN: 3.0000 mL | INTRAMUSCULAR | Status: DC | PRN

## 2014-08-02 MED ORDER — SODIUM CHLORIDE 0.9 % IV SOLN: 250.0000 mL | INTRAVENOUS | Status: DC | PRN

## 2014-08-02 MED ORDER — HEPARIN SODIUM (PORCINE) 1000 UNIT/ML IJ SOLN
INTRAMUSCULAR | Status: AC
  Filled 2014-08-02: qty 1

## 2014-08-02 MED ORDER — MIDAZOLAM HCL 2 MG/2ML IJ SOLN
INTRAMUSCULAR | Status: AC
  Filled 2014-08-02: qty 2

## 2014-08-02 NOTE — Progress Notes (Signed)
TR BAND REMOVAL  LOCATION:    right radial  DEFLATED PER PROTOCOL:    Yes.    TIME BAND OFF / DRESSING APPLIED:    1430   SITE UPON ARRIVAL:    Level 0  SITE AFTER BAND REMOVAL:    Level 0  CIRCULATION SENSATION AND MOVEMENT:    Within Normal Limits   Yes.    COMMENTS:   TRB removed/ tegaderm dsg applied

## 2014-08-02 NOTE — CV Procedure (Signed)
Procedure performed:  Left heart catheterization including hemodynamic monitoring of the left ventricle, LV gram, selective right and left coronary arteriography.  Indication patient is a 63 year-old African-American male with history of hypertension, history of pulmonary embolism in the past and on chronic long-term anticoagulation with Coumadin, GERD, who presents with syncope. Outpatient stress test had revealed a very small sized apical lateral ischemia of severe nature, however due to presentation with syncope and recent onset of chest discomfort he was brought to the cardiac catheterization lab for definitive diagnosis of coronary disease  Hemodynamic data:  Left ventricular pressure was 134/3 with LVEDP of 10 mm mercury. Aortic pressure was 140/84 with a mean of 109 mm mercury. There was no pressure gradient across the aortic valve  Left ventricle: Performed in the RAO projection revealed LVEF of 60 %. There was no significant MR. no wall motion abnormality. Evaluation of MR and wall motion abnormality is not very accurate due to hand injection of the left ventricle.  Right coronary artery: The vessel is smooth, normal,  Dominant.  Left main coronary artery is large and normal.  Circumflex coronary artery: A large vessel giving origin to a large obtuse marginal 1. It is smooth and normal.   LAD:  LAD gives origin to a large diagonal-1. Normal  Impression: Normal coronary arteries and normal left systolic function.   Technique: Under sterile precautions using a 6 French right radial  arterial access, a 6 French sheath was introduced into the right radial artery. A 5 [place] Tig 4 catheter was advanced into the ascending aorta selective  right coronary artery and left coronary artery was cannulated and angiography was performed in multiple views. The catheter was pulled back Out of the body over exchange length J-wire.  Same Catheter was used to perform LV gram which was performed in LAO  projection. Catheter exchanged out of the body over J-Wire. NO immediate complications noted. Patient tolerated the procedure well.   Rec: Medical therapy with aggressive risk factor reduction.   Disposition: Will be discharged home today with outpatient follow up.

## 2014-08-02 NOTE — Discharge Instructions (Signed)
Radial Site Care °Refer to this sheet in the next few weeks. These instructions provide you with information on caring for yourself after your procedure. Your caregiver may also give you more specific instructions. Your treatment has been planned according to current medical practices, but problems sometimes occur. Call your caregiver if you have any problems or questions after your procedure. °HOME CARE INSTRUCTIONS °· You may shower the day after the procedure. Remove the bandage (dressing) and gently wash the site with plain soap and water. Gently pat the site dry. °· Do not apply powder or lotion to the site. °· Do not submerge the affected site in water for 3 to 5 days. °· Inspect the site at least twice daily. °· Do not flex or bend the affected arm for 24 hours. °· No lifting over 5 pounds (2.3 kg) for 5 days after your procedure. °· Do not drive home if you are discharged the same day of the procedure. Have someone else drive you. °· You may drive 24 hours after the procedure unless otherwise instructed by your caregiver. °· Do not operate machinery or power tools for 24 hours. °· A responsible adult should be with you for the first 24 hours after you arrive home. °What to expect: °· Any bruising will usually fade within 1 to 2 weeks. °· Blood that collects in the tissue (hematoma) may be painful to the touch. It should usually decrease in size and tenderness within 1 to 2 weeks. °SEEK IMMEDIATE MEDICAL CARE IF: °· You have unusual pain at the radial site. °· You have redness, warmth, swelling, or pain at the radial site. °· You have drainage (other than a small amount of blood on the dressing). °· You have chills. °· You have a fever or persistent symptoms for more than 72 hours. °· You have a fever and your symptoms suddenly get worse. °· Your arm becomes pale, cool, tingly, or numb. °· You have heavy bleeding from the site. Hold pressure on the site. °Document Released: 09/28/2010 Document Revised:  11/18/2011 Document Reviewed: 09/28/2010 °ExitCare® Patient Information ©2015 [employer]. This information is not intended to replace advice given to you by your health care provider. Make sure you discuss any questions you have with your health care provider. ° °

## 2014-08-02 NOTE — Progress Notes (Signed)
[name] Malta  RN/ cath lab informed pt took coumadin last night 7mg // pt inr drawn.  No aspirin given/ not ordered, pt is intolerant due to ulcers, Pt needs H&P, Dr Einar Gip was made aware.

## 2014-08-02 NOTE — H&P (Signed)
  Please see office visit notes for complete details of HPI.  

## 2014-08-02 NOTE — Interval H&P Note (Signed)
Cath Lab Visit (complete for each Cath Lab visit)  Clinical Evaluation Leading to the Procedure:   ACS: No.  Non-ACS:    Anginal Classification: CCS II  Anti-ischemic medical therapy: Maximal Therapy (2 or more classes of medications)  Non-Invasive Test Results: Low-risk stress test findings: cardiac mortality <1%/year  Prior CABG: No previous CABG   Patient presenting with syncope and small sized but severe ischemia. Chest pain symptoms new.      History and Physical Interval Note:  08/02/2014 12:12 PM  [name] [name]  has presented today for surgery, with the diagnosis of cp  The various methods of treatment have been discussed with the patient and family. After consideration of risks, benefits and other options for treatment, the patient has consented to  Procedure(s): LEFT HEART CATHETERIZATION WITH CORONARY ANGIOGRAM (N/A) as a surgical intervention .  The patient's history has been reviewed, patient examined, no change in status, stable for surgery.  I have reviewed the patient's chart and labs.  Questions were answered to the patient's satisfaction.     Laverda Page

## 2014-08-03 ENCOUNTER — Ambulatory Visit: Payer: Medicare Other | Admitting: Physician Assistant

## 2014-08-10 ENCOUNTER — Other Ambulatory Visit (HOSPITAL_BASED_OUTPATIENT_CLINIC_OR_DEPARTMENT_OTHER): Payer: Medicare Other

## 2014-08-10 ENCOUNTER — Ambulatory Visit (INDEPENDENT_AMBULATORY_CARE_PROVIDER_SITE_OTHER): Payer: Medicare Other | Admitting: Pharmacist

## 2014-08-10 DIAGNOSIS — I2699 Other pulmonary embolism without acute cor pulmonale: Secondary | ICD-10-CM

## 2014-08-10 DIAGNOSIS — Z7901 Long term (current) use of anticoagulants: Secondary | ICD-10-CM

## 2014-08-10 LAB — POCT INR: INR: 2.8

## 2014-08-10 LAB — PROTIME-INR
INR: 2.8 (ref 2.00–3.50)
Protime: 33.6 Seconds — ABNORMAL HIGH (ref 10.6–13.4)

## 2014-08-10 NOTE — Progress Notes (Signed)
**  Telephone encounter no charge** [name] pt  INR = 2.8 today.  Mr [name] [name] called this am and came to have INR checked because he stated that his foot was "on fire" and swollen.  When I spoke to him on the phone, he had already seen his cardiologist, Dr. Einar Gip, and had changed some blood pressure meds.  Dr Einar Gip instructed Mr [name] [name] to make appt with PCP, Dr Mcarthur Rossetti, about issue with foot.   He has had no bleeding or bruising.  Will continue current coumadin dose of 10mg  SuTh and 7.5mg  other days.  Mr [name] [name] has been on this weekly dose of coumadin since 09/2013.  I will cancel coumadin clinic appt for next week and schedule 1 for one month.  Mr [name] [name] is aware to call with any concerns regarding his coumadin prior to next appt.

## 2014-08-11 ENCOUNTER — Encounter: Payer: Self-pay | Admitting: Physician Assistant

## 2014-08-11 ENCOUNTER — Telehealth: Payer: Self-pay | Admitting: *Deleted

## 2014-08-11 ENCOUNTER — Other Ambulatory Visit (INDEPENDENT_AMBULATORY_CARE_PROVIDER_SITE_OTHER): Payer: Medicare Other

## 2014-08-11 ENCOUNTER — Ambulatory Visit (INDEPENDENT_AMBULATORY_CARE_PROVIDER_SITE_OTHER): Payer: Medicare Other | Admitting: Physician Assistant

## 2014-08-11 VITALS — BP 152/90 | HR 68 | Ht 76.0 in | Wt 250.6 lb

## 2014-08-11 DIAGNOSIS — R110 Nausea: Secondary | ICD-10-CM

## 2014-08-11 DIAGNOSIS — R1013 Epigastric pain: Secondary | ICD-10-CM

## 2014-08-11 DIAGNOSIS — K5900 Constipation, unspecified: Secondary | ICD-10-CM

## 2014-08-11 LAB — COMPREHENSIVE METABOLIC PANEL
ALT: 16 U/L (ref 0–53)
AST: 19 U/L (ref 0–37)
Albumin: 4.2 g/dL (ref 3.5–5.2)
Alkaline Phosphatase: 54 U/L (ref 39–117)
BUN: 12 mg/dL (ref 6–23)
CO2: 28 mEq/L (ref 19–32)
Calcium: 9.3 mg/dL (ref 8.4–10.5)
Chloride: 103 mEq/L (ref 96–112)
Creatinine, Ser: 1.2 mg/dL (ref 0.4–1.5)
GFR: 79.08 mL/min (ref >60.00)
Glucose, Bld: 96 mg/dL (ref 70–99)
Potassium: 3.8 mEq/L (ref 3.5–5.1)
Sodium: 140 mEq/L (ref 135–145)
Total Bilirubin: 0.4 mg/dL (ref 0.2–1.2)
Total Protein: 7.4 g/dL (ref 6.0–8.3)

## 2014-08-11 LAB — CBC WITH DIFFERENTIAL/PLATELET
Basophils Absolute: 0 10*3/uL (ref 0.0–0.1)
Basophils Relative: 0.1 % (ref 0.0–3.0)
Eosinophils Absolute: 0.1 10*3/uL (ref 0.0–0.7)
Eosinophils Relative: 1.8 % (ref 0.0–5.0)
HCT: 43.6 % (ref 39.0–52.0)
Hemoglobin: 13.8 g/dL (ref 13.0–17.0)
Lymphocytes Relative: 33.1 % (ref 12.0–46.0)
Lymphs Abs: 1.4 10*3/uL (ref 0.7–4.0)
MCHC: 31.5 g/dL (ref 30.0–36.0)
MCV: 72.1 fl — ABNORMAL LOW (ref 78.0–100.0)
Monocytes Absolute: 0.4 10*3/uL (ref 0.1–1.0)
Monocytes Relative: 9 % (ref 3.0–12.0)
Neutro Abs: 2.4 10*3/uL (ref 1.4–7.7)
Neutrophils Relative %: 56 % (ref 43.0–77.0)
Platelets: 288 10*3/uL (ref 150.0–400.0)
RBC: 6.04 Mil/uL — ABNORMAL HIGH (ref 4.22–5.81)
RDW: 15.7 % — ABNORMAL HIGH (ref 11.5–15.5)
WBC: 4.3 10*3/uL (ref 4.0–10.5)

## 2014-08-11 MED ORDER — PROMETHAZINE HCL 25 MG PO TABS: 25.0000 mg | ORAL_TABLET | Freq: Four times a day (QID) | ORAL | Status: DC | PRN

## 2014-08-11 MED ORDER — OMEPRAZOLE 40 MG PO CPDR: DELAYED_RELEASE_CAPSULE | ORAL | Status: DC

## 2014-08-11 NOTE — Telephone Encounter (Signed)
  08/11/2014   RE: [name] [name] DOB: 1950/09/26 MRN: [id number]   Dear Dr. Curt Bears,    We have scheduled the above patient for an endoscopic procedure. Our records show that he is on anticoagulation therapy.   Please advise as to how long the patient may come off his therapy of Coumadin prior to the procedure, which is scheduled for 08-17-2014.  Please fax back/ or route the completed form to [place] at [phone].   Sincerely,    Amy Esterwood PA-C

## 2014-08-11 NOTE — Progress Notes (Signed)
Patient ID: [name] [name], male   DOB: 03-11-1951, 63 y.o.   MRN: [id number]   Subjective:    Patient ID: [name] [name], male    DOB: [birth year], 63 y.o.   MRN: [id number]  HPI [name] [name] is a 63 year old African-American male known to Dr. Deatra Ina. He had undergone colonoscopy in September 2015 because of complaint of rectal bleeding, he was found to have internal hemorrhoids and one sigmoid colon polyp 4 mm which was removed. This was a tubular adenoma. Patient has history of DVT and pulmonary embolus, he is maintained on Coumadin. Also with history of sleep apnea, and chronic back pain. Comes in today with complaints of right mid abdominal pain over the past few months and nausea. He says he has been nauseated fairly constantly over the past couple of months and feels that this is gotten worse. He has had some dry heaves but generally is not vomiting up his food. He also is having upper abdominal pain worse postprandially. He has been eating less than usual due to nausea and pain. He says his weight has fluctuated but he has not had much foot drop. He dysphagia or odynophagia no heartburn or indigestion. He also has chronic problems with constipation which has significantly worsened. Says he has some bowel movement almost every day but usually does not evacuate very much. Occasionally sees very small amount of bright red blood on the tissue. He also reports that he may have passed a clot a couple of weeks ago. Exline He does have remote history of peptic ulcer disease with bleeding in the 1970s and also remotely had undergone by his report a hiatal hernia repair in the 1970s. Omeprazole 20 mg by mouth daily no regular NSAIDs. 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  Review of Systems Pertinent positive and negative review of systems were noted in the above HPI section.  All other review of systems was otherwise negative.  Outpatient Encounter Prescriptions as of 08/11/2014  Medication Sig  . atorvastatin  (LIPITOR) 20 MG tablet   . DULoxetine (CYMBALTA) 60 MG capsule Take 60 mg by mouth daily.   Marland Kitchen escitalopram (LEXAPRO) 10 MG tablet   . fluticasone (FLONASE) 50 MCG/ACT nasal spray   . gabapentin (NEURONTIN) 100 MG capsule Take 100 mg by mouth daily.   . hydrALAZINE (APRESOLINE) 10 MG tablet Take 1 tablet by mouth 4 (four) times daily.  . metoprolol tartrate (LOPRESSOR) 25 MG tablet Take 25 mg by mouth 2 (two) times daily.  Marland Kitchen NIFEDICAL XL 60 MG 24 hr tablet Take 60 mg by mouth 2 (two) times daily.   . ondansetron (ZOFRAN ODT) 8 MG disintegrating tablet Take 1 tablet (8 mg total) by mouth every 8 (eight) hours as needed for nausea or vomiting.  . ondansetron (ZOFRAN) 8 MG tablet   . oxyCODONE (OXY IR/ROXICODONE) 5 MG immediate release tablet Take 5 mg by mouth every 6 (six) hours as needed (pain).   Marland Kitchen PROAIR HFA 108 (90 BASE) MCG/ACT inhaler Inhale 1 puff into the lungs every 6 (six) hours as needed for wheezing.   . pseudoephedrine (SUDAFED) 120 MG 12 hr tablet Take 120 mg by mouth every 12 (twelve) hours as needed for congestion.  Marland Kitchen tiZANidine (ZANAFLEX) 4 MG tablet Take 4 mg by mouth every 6 (six) hours as needed for muscle spasms.   . valsartan-hydrochlorothiazide (DIOVAN-HCT) 320-25 MG per tablet Take 1 tablet by mouth daily.  . VOLTAREN 1 % GEL Apply 2 g topically daily as needed. For  pain  . warfarin (COUMADIN) 5 MG tablet Take 7.5-10 mg by mouth daily. Take 10mg  on Sundays and thursdays(take two of the 5mg  tablet). 7.5mg  rest of days(take one whole 5mg  tablet then split in half make total of 7.5mg )  . [DISCONTINUED] omeprazole (PRILOSEC) 20 MG capsule Take 1 capsule (20 mg total) by mouth daily.  Marland Kitchen omeprazole (PRILOSEC) 40 MG capsule Take 1 capsule twice daily before breakfast and dinner.  . promethazine (PHENERGAN) 25 MG tablet Take 1 tablet (25 mg total) by mouth every 6 (six) hours as needed for nausea or vomiting.  . [DISCONTINUED] hydrochlorothiazide (HYDRODIURIL) 25 MG tablet Take 25  mg by mouth daily.  . [DISCONTINUED] HYDROcodone-acetaminophen (NORCO/VICODIN) 5-325 MG per tablet Take 1 tablet by mouth 2 (two) times daily as needed for moderate pain.   . [DISCONTINUED] lisinopril (PRINIVIL,ZESTRIL) 40 MG tablet Take 1 tablet by mouth daily.  . [DISCONTINUED] promethazine (PHENERGAN) 25 MG tablet Take 1 tablet (25 mg total) by mouth every 8 (eight) hours as needed for nausea. (Patient not taking: Reported on 08/01/2014)   Allergies  Allergen Reactions  . Aspirin Other (See Comments)    ulcers  . Celecoxib Other (See Comments)    Stomach irritation  . Ibuprofen Other (See Comments)    ulcers  . Lyrica [Pregabalin] Swelling   Patient Active Problem List   Diagnosis Date Noted  . Angina pectoris 08/01/2014  . Hemorrhage of rectum and anus 03/28/2014  . Neuropathy, peripheral 05/15/2012  . Pulmonary embolism 07/11/2011  . DVT (deep venous thrombosis) 07/11/2011  . Abdominal pain, left lower quadrant 01/29/2011  . Encounter for long-term (current) use of anticoagulants 01/29/2011  . Diarrhea 01/29/2011   History   Social History  . Marital Status: Widowed    Spouse Name: [name] [name]    Number of Children: 3  . Years of Education: 12th   Occupational History  . unemployed Other  .     Social History Main Topics  . Smoking status: Current Every Day Smoker -- 2.00 packs/day for 40 years    Types: Cigarettes  . Smokeless tobacco: Never Used  . Alcohol Use: No  . Drug Use: No  . Sexual Activity: Not on file   Other Topics Concern  . Not on file   Social History Narrative   Patient lives at home with his family.   Caffeine use: 1-2 cups daily    Mr. Korber family history includes Colon cancer in his maternal uncle; Diabetes in his mother; Heart disease in his father and mother; Multiple sclerosis in his daughter; Pancreatic cancer in his paternal uncle; Prostate cancer in his father, paternal uncle, and paternal uncle.      Objective:      Filed Vitals:   08/11/14 0837  BP: 152/90  Pulse: 68    Physical Exam well-developed older African-American male in no acute distress height 6 foot 4 weight 250 BMI 30. HEENT; nontraumatic normocephalic EOMI PERRLA sclera anicteric, Supple ;no JVD, Cardiovascular; regular rate and rhythm with S1-S2 no murmur or gallop, Pulmonary; clear bilaterally, Abdomen; soft she has tenderness in the epigastrium there is no palpable mass or hepatosplenomegaly is have a midline incisional scar and a right lower quadrant incisional scar, Bowel sounds are present, Rectal ;exam not done, Extremities; no clubbing cyanosis or edema kin warm and dry, Psych; mood and affect appropriate     Assessment & Plan:  #1  63 yo African-American male with 3-4 month history of nausea somewhat progressive and postprandial upper  abdominal pain, no significant weight loss. Etiology of his symptoms is not clear he does have remote history of peptic ulcer disease, and is status post cholecystectomy. #2 Status post remote hiatal hernia repair  #3 history of DVT and pulmonary embolus #4 chronic anticoagulation with Coumadin # 5 sleep apnea #6 small tubular adenoma on colonoscopy in September 2015, plan follow-up 2020  Plan; We will increase omeprazole to 40 mg by mouth twice daily Zofran has not helped so we will try Phenergan 12.5-25 mg by mouth every 6 hours when necessary for nausea Schedule for upper endoscopy with Dr. Deatra Ina. Procedure discussed in detail with the patient and he is agreeable to proceed Will obtain consent for him to come off Coumadin for 5 days prior to his procedure, prescribed by Dr. Earlie Server. CBC and CMETtoday If EGD is negative he will need CT of his abdomen and pelvis   Beckie Viscardi S Serenitee Fuertes PA-C 08/11/2014

## 2014-08-11 NOTE — Patient Instructions (Addendum)
Please go to the basement level to have your labs drawn.  We sent prescriptions to [place].  1. Omeprazole ( Prilosec 40 mg)   2. Promethazine ( Phenergan) 25 mg.  You have been scheduled for an endoscopy. Please follow written instructions given to you at your visit today. If you use inhalers (even only as needed), please bring them with you on the day of your procedure.  Take Miralax 17 graqms in 8 oz of juice or water along with stool softners as needed.

## 2014-08-11 NOTE — Telephone Encounter (Signed)
He is a patient of [name]. I saw him one time after he left the practice. Usually I ask them to hold their coumadin for 3 days before the procedure but you may need to double check with [name].

## 2014-08-11 NOTE — Progress Notes (Signed)
Reviewed and agree with management. Jewelle Whitner D. Elide Stalzer, M.D., FACG  

## 2014-08-12 ENCOUNTER — Telehealth: Payer: Self-pay | Admitting: *Deleted

## 2014-08-12 ENCOUNTER — Other Ambulatory Visit: Payer: Self-pay | Admitting: *Deleted

## 2014-08-12 NOTE — Telephone Encounter (Signed)
Message from [facility] @ [place] - pt is scheduled for Endoscopy this month, they will be faxing over an Anticoagulation Letter. Called Pam back to inform her Dr Beryle Beams will be back in the office on Wednesday. Talked to [place] who was informed. Office# [phone].

## 2014-08-12 NOTE — Telephone Encounter (Signed)
08/12/2014   RE: [name] [name] DOB: February 17, 1951 MRN: [id number]   Dear Dr. Murriel Hopper,    We have scheduled the above patient for an endoscopic procedure. Our records show that he is on anticoagulation therapy.   Please advise as to how long the patient may come off his therapy of Coumadin prior to the procedure, which is scheduled for 08-24-2014.  Please fax back/ or route the completed form to [place] at [phone].   Sincerely,    Amy Esterwood PA-C    [place]

## 2014-08-12 NOTE — Telephone Encounter (Signed)
I called and left a message today for Dr. Worthy Flank nurse. I advised that this patient is having an Endoscopy on 08-17-2014 and we need coumadin clearance. I advised I routed a letter to Dr. [name] yesterday 08-11-2014.

## 2014-08-15 ENCOUNTER — Ambulatory Visit: Payer: Medicare Other

## 2014-08-15 ENCOUNTER — Other Ambulatory Visit: Payer: Medicare Other

## 2014-08-15 NOTE — Telephone Encounter (Signed)
Pam, will forward to [name]-

## 2014-08-15 NOTE — Telephone Encounter (Signed)
OK to hold coumadin for 3 days not 5  And he will need a lovenox bridge  i will alert our pharmacists  To assist & advise pt  EGD should be low risk for need of biopsy

## 2014-08-15 NOTE — Telephone Encounter (Signed)
I called and spoke to Mrs. Bradyn and I advised her that [name] emailed me and said Fritsch can hold the coumadin for 3 days prior to the procedure date. He can stop the coumadin on 12-13 and resume it on 08-25-2014.   Also, Dr. Azucena Freed office will be calling Mr. Zyad, advising  him of the Lovenox injections he will need.  Mrs. Abrham said they are very good about letting them know about his anticoagulants.

## 2014-08-16 NOTE — Telephone Encounter (Signed)
Dr Beryle Beams did get back to me over the weekend and I called the patient on Mon 08-15-2014 with his instructions for the coumadin.  He is to hold it only 3 days prior to the procedure. Dr Beryle Beams is having one of his staff  Call the patient regarding lovenox injections.

## 2014-08-17 ENCOUNTER — Encounter: Payer: Medicare Other | Admitting: Gastroenterology

## 2014-08-18 ENCOUNTER — Encounter (HOSPITAL_COMMUNITY): Payer: Self-pay | Admitting: Cardiology

## 2014-08-18 ENCOUNTER — Telehealth: Payer: Self-pay | Admitting: Pharmacist

## 2014-08-18 NOTE — Telephone Encounter (Signed)
[name] [name] is scheduled for endoscopy on 08/24/14. Called and spoke with patient to give him Dr. Azucena Freed instructions for anticoagulation around this procedure. He will discontinue Coumadin starting 08/21/14. He will come in to the clinic on 12/15 for INR check and to get Lovenox syringes. He will resume Coumadin on the night of the procedure and start Lovenox the next day. His Lovenox dose is > 150 mg so he will need to use 2 syringes for each daily dose. He is aware of this and has had to give himself 2 injections when on Lovenox previously. He will continue Lovenox until INR is within goal range.

## 2014-08-23 ENCOUNTER — Ambulatory Visit (HOSPITAL_BASED_OUTPATIENT_CLINIC_OR_DEPARTMENT_OTHER): Payer: Medicare Other | Admitting: Pharmacist

## 2014-08-23 ENCOUNTER — Other Ambulatory Visit (HOSPITAL_BASED_OUTPATIENT_CLINIC_OR_DEPARTMENT_OTHER): Payer: Medicare Other

## 2014-08-23 DIAGNOSIS — I2699 Other pulmonary embolism without acute cor pulmonale: Secondary | ICD-10-CM

## 2014-08-23 DIAGNOSIS — Z7901 Long term (current) use of anticoagulants: Secondary | ICD-10-CM

## 2014-08-23 LAB — PROTIME-INR
INR: 1.8 — ABNORMAL LOW (ref 2.00–3.50)
Protime: 21.6 Seconds — ABNORMAL HIGH (ref 10.6–13.4)

## 2014-08-23 LAB — POCT INR: INR: 1.8

## 2014-08-23 NOTE — Progress Notes (Signed)
INR = 1.8    Coumadin on hold for endoscopy 12/16. Coumadin was discontinued 08/21/14. INR drifting down. He will resume Coumadin on the night of the procedure and start Lovenox the next day. His Lovenox dose is > 150 mg so he will need to use 2 syringes for each daily dose. Lovenox 80 mg x 20 given to patient. He is aware of this and has had to give himself 2 injections when on Lovenox previously. He will continue Lovenox until INR is within goal range. He will return on 08/29/14 at 1pm for lab and 1:15 for Coumadin clinc. Gave patient for Dr. Azucena Freed clinic and instructed him to make an appt.  [name] [name], PharmD

## 2014-08-24 ENCOUNTER — Encounter: Payer: Self-pay | Admitting: Gastroenterology

## 2014-08-24 ENCOUNTER — Ambulatory Visit (AMBULATORY_SURGERY_CENTER): Payer: Medicare Other | Admitting: Gastroenterology

## 2014-08-24 VITALS — BP 109/74 | HR 19 | Temp 98.8°F | Resp 16 | Ht 76.0 in | Wt 250.0 lb

## 2014-08-24 DIAGNOSIS — B9681 Helicobacter pylori [H. pylori] as the cause of diseases classified elsewhere: Secondary | ICD-10-CM

## 2014-08-24 DIAGNOSIS — K2970 Gastritis, unspecified, without bleeding: Secondary | ICD-10-CM

## 2014-08-24 DIAGNOSIS — R110 Nausea: Secondary | ICD-10-CM

## 2014-08-24 MED ORDER — HYOSCYAMINE SULFATE ER 0.375 MG PO TBCR: EXTENDED_RELEASE_TABLET | ORAL | Status: DC

## 2014-08-24 MED ORDER — SODIUM CHLORIDE 0.9 % IV SOLN: 500.0000 mL | INTRAVENOUS | Status: DC

## 2014-08-24 NOTE — Progress Notes (Signed)
Called to room to assist during endoscopic procedure.  Patient ID and intended procedure confirmed with present staff. Received instructions for my participation in the procedure from the performing physician.  

## 2014-08-24 NOTE — Progress Notes (Signed)
A/ox3 pleased with MAC, report to Celia RN 

## 2014-08-24 NOTE — Patient Instructions (Signed)
Discharge instructions given. Biopsies taken. Handout on gastritis. Office will arrange for a CT SCAN of abdomen and pelvis. Contrast given in recovery room. hyomax as needed for abdominal pain. Resume Coumadin tomorrow. YOU HAD AN ENDOSCOPIC PROCEDURE TODAY AT [facility]: Refer to the procedure report that was given to you for any specific questions about what was found during the examination.  If the procedure report does not answer your questions, please call your gastroenterologist to clarify.  If you requested that your care partner not be given the details of your procedure findings, then the procedure report has been included in a sealed envelope for you to review at your convenience later.  YOU SHOULD EXPECT: Some feelings of bloating in the abdomen. Passage of more gas than usual.  Walking can help get rid of the air that was put into your GI tract during the procedure and reduce the bloating. If you had a lower endoscopy (such as a colonoscopy or flexible sigmoidoscopy) you may notice spotting of blood in your stool or on the toilet paper. If you underwent a bowel prep for your procedure, then you may not have a normal bowel movement for a few days.  DIET: Your first meal following the procedure should be a light meal and then it is ok to progress to your normal diet.  A half-sandwich or bowl of soup is an example of a good first meal.  Heavy or fried foods are harder to digest and may make you feel nauseous or bloated.  Likewise meals heavy in dairy and vegetables can cause extra gas to form and this can also increase the bloating.  Drink plenty of fluids but you should avoid alcoholic beverages for 24 hours.  ACTIVITY: Your care partner should take you home directly after the procedure.  You should plan to take it easy, moving slowly for the rest of the day.  You can resume normal activity the day after the procedure however you should NOT DRIVE or use heavy machinery for  24 hours (because of the sedation medicines used during the test).    SYMPTOMS TO REPORT IMMEDIATELY: A gastroenterologist can be reached at any hour.  During normal business hours, 8:30 AM to 5:00 PM Monday through Friday, call [phone].  After hours and on weekends, please call the GI answering service at [phone] who will take a message and have the physician on call contact you.   Following upper endoscopy (EGD)  Vomiting of blood or coffee ground material  New chest pain or pain under the shoulder blades  Painful or persistently difficult swallowing  New shortness of breath  Fever of 100F or higher  Black, tarry-looking stools  FOLLOW UP: If any biopsies were taken you will be contacted by phone or by letter within the next 1-3 weeks.  Call your gastroenterologist if you have not heard about the biopsies in 3 weeks.  Our staff will call the home number listed on your records the next business day following your procedure to check on you and address any questions or concerns that you may have at that time regarding the information given to you following your procedure. This is a courtesy call and so if there is no answer at the home number and we have not heard from you through the emergency physician on call, we will assume that you have returned to your regular daily activities without incident.  SIGNATURES/CONFIDENTIALITY: You and/or your care partner have signed paperwork which will  be entered into your electronic medical record.  These signatures attest to the fact that that the information above on your After Visit Summary has been reviewed and is understood.  Full responsibility of the confidentiality of this discharge information lies with you and/or your care-partner. 

## 2014-08-24 NOTE — Op Note (Addendum)
[place]  [address] [place], [id number]   ENDOSCOPY PROCEDURE REPORT  PATIENT: [name] [name], [name] [name]  MR#: [id number] BIRTHDATE: July 03, 1951 , 36  yrs. old GENDER: male ENDOSCOPIST: [name], MD REFERRED BY: PROCEDURE DATE:  08/24/2014 PROCEDURE:  EGD w/ biopsy ASA CLASS:     Class II INDICATIONS:  epigastric pain and nausea. MEDICATIONS: Monitored anesthesia care and Propofol 120 mg IV TOPICAL ANESTHETIC:  DESCRIPTION OF PROCEDURE: After the risks benefits and alternatives of the procedure were thoroughly explained, informed consent was obtained.  The LB GIF-H180 Loaner [id number] endoscope was introduced through the mouth and advanced to the second portion of the duodenum , Without limitations.  The instrument was slowly withdrawn as the mucosa was fully examined.    STOMACH: Mild congested gastritis (inflammation) was found in the gastric body and gastric fundus.  There were few erosions present. Multiple biopsies were performed using cold forceps.   Except for the findings listed the EGD was otherwise normal.  Retroflexed views revealed no abnormalities.     The scope was then withdrawn from the patient and the procedure completed.  COMPLICATIONS: There were no immediate complications.  ENDOSCOPIC IMPRESSION: 1.   Gastritis (inflammation) was found in the gastric body and gastric fundus; multiple biopsies were performed 2.   EGD was otherwise normal  Findings probably do not explain etiology for abdominal pain and nausea  RECOMMENDATIONS: My office will arrange for you to have a CT scan of your abdomen and pelvis. Hyomax as needed for abdominal pain Resume coumadin in am  REPEAT EXAM:  eSigned:  [name], MD 08/24/2014 3:42 PM Revised: 08/24/2014 3:42 PM   CC: Jossie Ng, MD

## 2014-08-25 ENCOUNTER — Telehealth: Payer: Self-pay | Admitting: *Deleted

## 2014-08-25 ENCOUNTER — Telehealth: Payer: Self-pay | Admitting: Gastroenterology

## 2014-08-25 NOTE — Telephone Encounter (Signed)
  Follow up Call-  Call back number 08/24/2014 05/18/2014  Post procedure Call Back phone  # [phone]  Permission to leave phone message Yes Yes     Patient questions:  Do you have a fever, pain , or abdominal swelling? No. Pain Score  0 *  Have you tolerated food without any problems? Yes.    Have you been able to return to your normal activities? Yes.    Do you have any questions about your discharge instructions: Diet   No. Medications  No. Follow up visit  No.  Do you have questions or concerns about your Care? No.  Actions: * If pain score is 4 or above: No action needed, pain <4.

## 2014-08-25 NOTE — Telephone Encounter (Signed)
Dr Deatra Ina, What alternative do you want to use Insurance not paying for hyomax

## 2014-08-27 ENCOUNTER — Other Ambulatory Visit: Payer: Self-pay | Admitting: Nurse Practitioner

## 2014-08-28 NOTE — Telephone Encounter (Signed)
Robinul forte 2 mg bid prn

## 2014-08-29 ENCOUNTER — Ambulatory Visit: Payer: Medicare Other

## 2014-08-29 ENCOUNTER — Other Ambulatory Visit: Payer: Medicare Other

## 2014-08-29 MED ORDER — GLYCOPYRROLATE 2 MG PO TABS: 2.0000 mg | ORAL_TABLET | Freq: Two times a day (BID) | ORAL | Status: DC

## 2014-08-30 ENCOUNTER — Ambulatory Visit (HOSPITAL_BASED_OUTPATIENT_CLINIC_OR_DEPARTMENT_OTHER): Payer: Medicare Other | Admitting: Pharmacist

## 2014-08-30 ENCOUNTER — Ambulatory Visit (HOSPITAL_BASED_OUTPATIENT_CLINIC_OR_DEPARTMENT_OTHER): Payer: Medicare Other | Admitting: Lab

## 2014-08-30 ENCOUNTER — Ambulatory Visit: Payer: Medicare Other

## 2014-08-30 ENCOUNTER — Other Ambulatory Visit: Payer: Medicare Other

## 2014-08-30 DIAGNOSIS — Z7901 Long term (current) use of anticoagulants: Secondary | ICD-10-CM

## 2014-08-30 DIAGNOSIS — I2699 Other pulmonary embolism without acute cor pulmonale: Secondary | ICD-10-CM

## 2014-08-30 LAB — PROTIME-INR
INR: 3.4 (ref 2.00–3.50)
Protime: 40.8 Seconds — ABNORMAL HIGH (ref 10.6–13.4)

## 2014-08-30 LAB — POCT INR: INR: 3.4

## 2014-08-30 NOTE — Progress Notes (Signed)
*  No charge patient - [name] patient* INR just above goal Pt is doing well with no complaints His procedure went well last week and his INR is now back to goal after being off coumadin for procedure He states that he is feeling better than he has over the past month or so No medication or diet changes to report No unusual bleeding or bruising No missed or extra does [name] [name] has not called Dr. Azucena Freed office yet to schedule appointment. I instructed [name] on the importance of needing to do this as soon as possible and he agreed he would call for an appointment Plan: Stop lovenox Today take only 5 mg then continue same dose of 7.5 mg daily except for 10 mg on Thu and Sunday. Pt has been stable on current dose for ~1 year Recheck on 09/14/14 at 1pm for lab and 1:15pm for coumadin clinic

## 2014-08-30 NOTE — Patient Instructions (Signed)
INR just above goal Today take only 5 mg then continue same dose of 7.5 mg daily except for 10 mg on Thu and Sunday.  Recheck on 09/14/14 at 1pm for lab and 1:15pm for coumadin clinic

## 2014-09-04 ENCOUNTER — Encounter: Payer: Self-pay | Admitting: Gastroenterology

## 2014-09-05 ENCOUNTER — Telehealth: Payer: Self-pay | Admitting: *Deleted

## 2014-09-05 ENCOUNTER — Telehealth: Payer: Self-pay | Admitting: Gastroenterology

## 2014-09-05 MED ORDER — AMOXICILL-CLARITHRO-LANSOPRAZ PO MISC: Freq: Two times a day (BID) | ORAL | Status: DC

## 2014-09-05 NOTE — Telephone Encounter (Signed)
Spoke with pt's wife and explained H pylori findings and that result letter would be sent to pt.  Prevpac explained to pt.  Prevpac RX sent to pt's pharmacy- [place]

## 2014-09-08 NOTE — Telephone Encounter (Signed)
Called pharmacy they said to split script up so insurance will cover  So I called in new script for med   Pt informed

## 2014-09-14 ENCOUNTER — Ambulatory Visit (HOSPITAL_BASED_OUTPATIENT_CLINIC_OR_DEPARTMENT_OTHER): Payer: PPO | Admitting: Pharmacist

## 2014-09-14 ENCOUNTER — Other Ambulatory Visit (HOSPITAL_BASED_OUTPATIENT_CLINIC_OR_DEPARTMENT_OTHER): Payer: PPO

## 2014-09-14 DIAGNOSIS — I2699 Other pulmonary embolism without acute cor pulmonale: Secondary | ICD-10-CM

## 2014-09-14 DIAGNOSIS — Z7901 Long term (current) use of anticoagulants: Secondary | ICD-10-CM

## 2014-09-14 LAB — PROTIME-INR
INR: 2.9 (ref 2.00–3.50)
Protime: 34.8 Seconds — ABNORMAL HIGH (ref 10.6–13.4)

## 2014-09-14 LAB — POCT INR: INR: 2.9

## 2014-09-14 MED ORDER — WARFARIN SODIUM 5 MG PO TABS
ORAL_TABLET | ORAL | Status: DC
Start: 2014-09-14 — End: 2015-03-03

## 2014-09-14 NOTE — Patient Instructions (Signed)
INR at goal No changes Continue same dose of 7.5 mg daily except for 10 mg on Thu and Sunday.  Recheck on 10/06/14 at 1:15pm for lab and 1:30pm for coumadin clinic

## 2014-09-14 NOTE — Progress Notes (Signed)
*  No charge- [name] patient* INR at goal Pt is doing well but is still having some issues He was found to have an H. Pylori infection and was started on PPI, amoxicillin, and clarithromycin ~ 5 days ago. The antibiotics have the potential to increase effects of coumadin but usually mild if at all. INR at goal currently. Will make no dose adjustments No other medication or diet changes No missed or extra doses No unusual bleeding or bruising Plan: No changes Continue same dose of 7.5 mg daily except for 10 mg on Thu and Sunday.  Recheck on 10/06/14 at 1:15pm for lab and 1:30pm for coumadin clinic

## 2014-09-16 ENCOUNTER — Telehealth: Payer: Self-pay | Admitting: Gastroenterology

## 2014-09-16 MED ORDER — PROMETHAZINE HCL 25 MG PO TABS: 25.0000 mg | ORAL_TABLET | Freq: Four times a day (QID) | ORAL | Status: DC | PRN

## 2014-09-16 NOTE — Telephone Encounter (Signed)
Called pt and informed med sent to pharmacy

## 2014-10-06 ENCOUNTER — Ambulatory Visit: Payer: PPO

## 2014-10-06 ENCOUNTER — Other Ambulatory Visit: Payer: PPO

## 2014-10-07 ENCOUNTER — Ambulatory Visit (HOSPITAL_COMMUNITY)
Admission: RE | Admit: 2014-10-07 | Discharge: 2014-10-07 | Disposition: A | Discharge status: Home / Self Care | Payer: PPO | Source: Ambulatory Visit | Attending: Internal Medicine | Admitting: Internal Medicine

## 2014-10-07 ENCOUNTER — Other Ambulatory Visit (HOSPITAL_BASED_OUTPATIENT_CLINIC_OR_DEPARTMENT_OTHER): Payer: PPO

## 2014-10-07 ENCOUNTER — Ambulatory Visit: Payer: PPO | Admitting: Pharmacist

## 2014-10-07 DIAGNOSIS — Z7901 Long term (current) use of anticoagulants: Secondary | ICD-10-CM

## 2014-10-07 DIAGNOSIS — Z5181 Encounter for therapeutic drug level monitoring: Secondary | ICD-10-CM | POA: Insufficient documentation

## 2014-10-07 DIAGNOSIS — I2699 Other pulmonary embolism without acute cor pulmonale: Secondary | ICD-10-CM

## 2014-10-07 LAB — PROTIME-INR
INR: 3.99 — ABNORMAL HIGH (ref 0.00–1.49)
Prothrombin Time: 39.2 seconds — ABNORMAL HIGH (ref 11.6–15.2)

## 2014-10-07 LAB — POCT INR: INR: 3.99

## 2014-10-07 NOTE — Progress Notes (Signed)
INR above goal today at 3.99 (goal of 2-3) *Telephone Encounter - No Charge* Pt is doing well with no complaints No missed or extra doses No diet changes No unusual bleeding or bruising Patient has completed 14 day course of amoxicillin and clarithromycin on Tuesday this week which could have increased his INR Will decrease dose slightly this week Plan: Hold x 1 day then decrease to coumadin 7.5 mg daily. Recheck on 10/14/14 at 1:15pm for lab and 1:30pm for coumadin clinic

## 2014-10-07 NOTE — Patient Instructions (Addendum)
INR above goal Hold x 1 day then decrease to coumadin 7.5 mg daily.  Recheck on 10/14/14 at 1:15pm for lab and 1:30pm for coumadin clinic

## 2014-10-12 ENCOUNTER — Encounter: Payer: Self-pay | Admitting: Gastroenterology

## 2014-10-12 ENCOUNTER — Ambulatory Visit (INDEPENDENT_AMBULATORY_CARE_PROVIDER_SITE_OTHER): Payer: PPO | Admitting: Gastroenterology

## 2014-10-12 ENCOUNTER — Other Ambulatory Visit (INDEPENDENT_AMBULATORY_CARE_PROVIDER_SITE_OTHER): Payer: PPO

## 2014-10-12 VITALS — BP 120/70 | HR 84 | Ht 76.0 in | Wt 247.0 lb

## 2014-10-12 DIAGNOSIS — R1032 Left lower quadrant pain: Secondary | ICD-10-CM

## 2014-10-12 DIAGNOSIS — R109 Unspecified abdominal pain: Secondary | ICD-10-CM

## 2014-10-12 LAB — CBC WITH DIFFERENTIAL/PLATELET
Basophils Absolute: 0 10*3/uL (ref 0.0–0.1)
Basophils Relative: 0.3 % (ref 0.0–3.0)
Eosinophils Absolute: 0.1 10*3/uL (ref 0.0–0.7)
Eosinophils Relative: 1 % (ref 0.0–5.0)
HCT: 42.5 % (ref 39.0–52.0)
Hemoglobin: 13.9 g/dL (ref 13.0–17.0)
Lymphocytes Relative: 22.8 % (ref 12.0–46.0)
Lymphs Abs: 1.7 10*3/uL (ref 0.7–4.0)
MCHC: 32.7 g/dL (ref 30.0–36.0)
MCV: 70.3 fl — ABNORMAL LOW (ref 78.0–100.0)
Monocytes Absolute: 0.5 10*3/uL (ref 0.1–1.0)
Monocytes Relative: 6.9 % (ref 3.0–12.0)
Neutro Abs: 5.1 10*3/uL (ref 1.4–7.7)
Neutrophils Relative %: 69 % (ref 43.0–77.0)
Platelets: 265 10*3/uL (ref 150.0–400.0)
RBC: 6.05 Mil/uL — ABNORMAL HIGH (ref 4.22–5.81)
RDW: 15.5 % (ref 11.5–15.5)
WBC: 7.5 10*3/uL (ref 4.0–10.5)

## 2014-10-12 LAB — COMPREHENSIVE METABOLIC PANEL
ALT: 17 U/L (ref 0–53)
AST: 15 U/L (ref 0–37)
Albumin: 4.3 g/dL (ref 3.5–5.2)
Alkaline Phosphatase: 56 U/L (ref 39–117)
BUN: 14 mg/dL (ref 6–23)
CO2: 32 mEq/L (ref 19–32)
Calcium: 9.5 mg/dL (ref 8.4–10.5)
Chloride: 104 mEq/L (ref 96–112)
Creatinine, Ser: 1.09 mg/dL (ref 0.40–1.50)
GFR: 87.46 mL/min (ref >60.00)
Glucose, Bld: 79 mg/dL (ref 70–99)
Potassium: 3.9 mEq/L (ref 3.5–5.1)
Sodium: 141 mEq/L (ref 135–145)
Total Bilirubin: 0.4 mg/dL (ref 0.2–1.2)
Total Protein: 6.9 g/dL (ref 6.0–8.3)

## 2014-10-12 LAB — AMYLASE: Amylase: 53 U/L (ref 27–131)

## 2014-10-12 LAB — LIPASE: Lipase: 33 U/L (ref 11.0–59.0)

## 2014-10-12 LAB — C-REACTIVE PROTEIN: CRP: 0.2 mg/dL — ABNORMAL LOW (ref 0.5–20.0)

## 2014-10-12 MED ORDER — HYOSCYAMINE SULFATE ER 0.375 MG PO TBCR: EXTENDED_RELEASE_TABLET | ORAL | Status: DC

## 2014-10-12 NOTE — Assessment & Plan Note (Addendum)
Persistent epigastric pain with episodes of similar pain in the past without evident diagnosis.  Patient was treated for H. pylori but I did not feel that findings at endoscopy account for his symptoms.  Recurrence of symptoms over a couple of years mitigate against an abdominal neoplasm.  There is no history of pancreatitis or alcohol abuse.  Lab work including LFTs is unremarkable.  Patient is on chronic anticoagulation.  Doubt he has mesenteric ischemia or ischemic colitis.  He often has constipation with intermittent diarrhea.  Pain from obstipation he also be considered  Recommendations #1 check amylase, lipase and repeat CBC #2 CT the abdomen and pelvis #3 trial of hyomax #4 if workup is unrevealing I will place him on a bowel regimen to help determine whether obstipation and constipation are conflicting factors

## 2014-10-12 NOTE — Progress Notes (Signed)
      History of Present Illness:  [name] [name] has returned for follow-up of abdominal pain.  At colonoscopy a small adenomatous polyp was removed.  He underwent upper endoscopy in December because of epigastric pain.  Gastritis positive for H. pylori was seen and treated.  The patient continues to complain of moderately severe midepigastric pain.  He's had frequent nausea and occasional vomiting.  Symptoms do not seem correlated with eating or bowel movements.  He complains of erratic bowels consisting of episodes of constipation and occasional diarrhea.  He has had similar limited episodes of pain in the last 2 years.  He was hospitalized in [place] at one point it is unaware of any diagnosis.  He is status post cholecystectomy and hiatal hernia repair.    Review of Systems: Pertinent positive and negative review of systems were noted in the above HPI section. All other review of systems were otherwise negative.    Current Medications, Allergies, Past Medical History, Past Surgical History, Family History and Social History were reviewed in [facility] record  Vital signs were reviewed in today's medical record. Physical Exam: General: Well developed , well nourished, no acute distress Skin: anicteric Head: Normocephalic and atraumatic Eyes:  sclerae anicteric, EOMI Ears: Normal auditory acuity Mouth: No deformity or lesions Lungs: Clear throughout to auscultation Heart: Regular rate and rhythm; no murmurs, rubs or bruits Abdomen: Soft, non tender and non distended. No masses, hepatosplenomegaly or hernias noted. Normal Bowel sounds Rectal:deferred Musculoskeletal: Symmetrical with no gross deformities  Pulses:  Normal pulses noted Extremities: No clubbing, cyanosis, edema or deformities noted Neurological: Alert oriented x 4, grossly nonfocal Psychological:  Alert and cooperative. Normal mood and affect  See Assessment and Plan under Problem List

## 2014-10-12 NOTE — Patient Instructions (Signed)
You have been scheduled for a CT scan of the abdomen and pelvis at [facility] ([address]---this is in the same building as Press [facility]).   You are scheduled on 10/17/2014 at 1:30pm. You should arrive 15 minutes prior to your appointment time for registration. Please follow the written instructions below on the day of your exam:  WARNING: IF YOU ARE ALLERGIC TO IODINE/X-RAY DYE, PLEASE NOTIFY RADIOLOGY IMMEDIATELY AT [phone]! YOU WILL BE GIVEN A 13 HOUR PREMEDICATION PREP.  1) Do not eat or drink anything after 9:30am (4 hours prior to your test) 2) You have been given 2 bottles of oral contrast to drink. The solution may taste               better if refrigerated, but do NOT add ice or any other liquid to this solution. Shake             well before drinking.    Drink 1 bottle of contrast @ 11:30am (2 hours prior to your exam)  Drink 1 bottle of contrast @ 12:30pm (1 hour prior to your exam)  You may take any medications as prescribed with a small amount of water except for the following: Metformin, Glucophage, Glucovance, Avandamet, Riomet, Fortamet, Actoplus Met, Janumet, Glumetza or Metaglip. The above medications must be held the day of the exam AND 48 hours after the exam.  The purpose of you drinking the oral contrast is to aid in the visualization of your intestinal tract. The contrast solution may cause some diarrhea. Before your exam is started, you will be given a small amount of fluid to drink. Depending on your individual set of symptoms, you may also receive an intravenous injection of x-ray contrast/dye. Plan on being at [facility] for 30 minutes or long, depending on the type of exam you are having performed.  This test typically takes 30-45 minutes to complete.  If you have any questions regarding your exam or if you need to reschedule, you may call the CT department at [phone] between the hours of 8:00 am and 5:00 pm,  Monday-Friday.  ________________________________________________________________________    Go to the basement for labs today Cc.Eldred Manges

## 2014-10-13 ENCOUNTER — Telehealth: Payer: Self-pay | Admitting: Gastroenterology

## 2014-10-13 ENCOUNTER — Other Ambulatory Visit (INDEPENDENT_AMBULATORY_CARE_PROVIDER_SITE_OTHER): Payer: PPO

## 2014-10-13 DIAGNOSIS — D509 Iron deficiency anemia, unspecified: Secondary | ICD-10-CM

## 2014-10-13 LAB — IRON AND TIBC
%SAT: 34 % (ref 20–55)
Iron: 105 ug/dL (ref 42–165)
TIBC: 308 ug/dL (ref 215–435)
UIBC: 203 ug/dL (ref 125–400)

## 2014-10-13 LAB — FERRITIN: Ferritin: 77.9 ng/mL (ref 22.0–322.0)

## 2014-10-14 ENCOUNTER — Ambulatory Visit: Payer: PPO

## 2014-10-14 ENCOUNTER — Other Ambulatory Visit: Payer: PPO

## 2014-10-14 ENCOUNTER — Telehealth: Payer: Self-pay | Admitting: Pharmacist

## 2014-10-14 NOTE — Telephone Encounter (Signed)
Pt called [facility] stating he cannot come for lab & Coumadin clinic appt today.   He rescheduled for Mon 10/17/14 at 11:45 am for lab; 12 pm for Coumadin clinic. He has a CT scan on the same day on [place] at 1:30 pm. Kennith Center, Pharm.D., CPP 10/14/2014@11 :32 AM

## 2014-10-17 ENCOUNTER — Ambulatory Visit: Payer: PPO

## 2014-10-17 ENCOUNTER — Other Ambulatory Visit: Payer: PPO

## 2014-10-17 ENCOUNTER — Ambulatory Visit (INDEPENDENT_AMBULATORY_CARE_PROVIDER_SITE_OTHER)
Admission: RE | Admit: 2014-10-17 | Discharge: 2014-10-17 | Disposition: A | Discharge status: Home / Self Care | Payer: PPO | Source: Ambulatory Visit | Attending: Gastroenterology | Admitting: Gastroenterology

## 2014-10-17 DIAGNOSIS — R109 Unspecified abdominal pain: Secondary | ICD-10-CM

## 2014-10-17 MED ORDER — IOHEXOL 300 MG/ML  SOLN
100.0000 mL | Freq: Once | INTRAMUSCULAR | Status: AC | PRN
  Administered 2014-10-17: 100 mL via INTRAVENOUS

## 2014-10-18 ENCOUNTER — Other Ambulatory Visit (HOSPITAL_BASED_OUTPATIENT_CLINIC_OR_DEPARTMENT_OTHER): Payer: PPO

## 2014-10-18 ENCOUNTER — Ambulatory Visit (HOSPITAL_BASED_OUTPATIENT_CLINIC_OR_DEPARTMENT_OTHER): Payer: PPO | Admitting: Pharmacist

## 2014-10-18 DIAGNOSIS — I2699 Other pulmonary embolism without acute cor pulmonale: Secondary | ICD-10-CM

## 2014-10-18 DIAGNOSIS — Z7901 Long term (current) use of anticoagulants: Secondary | ICD-10-CM

## 2014-10-18 LAB — PROTIME-INR
INR: 3.8 — ABNORMAL HIGH (ref 2.00–3.50)
Protime: 45.6 Seconds — ABNORMAL HIGH (ref 10.6–13.4)

## 2014-10-18 LAB — POCT INR: INR: 3.8

## 2014-10-18 NOTE — Progress Notes (Signed)
INR = 3.8     Goal 2-3 INR above goal range despite decreased dose. No dietary changes or new medications. Patient has completed abx for H. Pylori. He states that he feels "light headed" at times when his INR is high. He is not light headed currently. We will hold Coumadin x 1 day then decrease to 7.5 mg daily except 5 mg on Thursday and Sunday. He will be out of town next week. We will recheck on 11/01/14 at 3pm for lab and 3:15pm for Coumadin clinic. ([name] patient).  Samples given:  Coumadin 2.5 mg #20   Lot:  [id number]   Exp:  06/09/15  [name], PharmD

## 2014-10-19 NOTE — Telephone Encounter (Signed)
Dr Deatra Ina can we send in this prescription to pts pharmacy that he is suggesting?

## 2014-10-20 ENCOUNTER — Other Ambulatory Visit: Payer: Self-pay | Admitting: Pharmacist

## 2014-10-20 MED ORDER — CILIDINIUM-CHLORDIAZEPOXIDE 2.5-5 MG PO CAPS: 1.0000 | ORAL_CAPSULE | Freq: Three times a day (TID) | ORAL | Status: DC

## 2014-10-20 NOTE — Telephone Encounter (Signed)
Okay 

## 2014-10-20 NOTE — Telephone Encounter (Signed)
Left message for patient that med was sent into his pharmacy  [address]

## 2014-10-27 ENCOUNTER — Telehealth: Payer: Self-pay | Admitting: Gastroenterology

## 2014-10-27 NOTE — Telephone Encounter (Signed)
Mrs. Imbert reports the patient has had 4 diarrhea stools today. He has not had any appetite, but she will encourage fluids. He has taken Librax without improvement of his symptoms. He has stomach pain. He is able to stand and walk without worsening his pain. Afebrile. No vomiting or bloody stools. Patient seen earlier this month for his symptoms. This episode began last night.

## 2014-10-28 ENCOUNTER — Other Ambulatory Visit: Payer: Self-pay

## 2014-10-28 MED ORDER — GLYCOPYRROLATE 2 MG PO TABS: 2.0000 mg | ORAL_TABLET | Freq: Two times a day (BID) | ORAL | Status: DC

## 2014-10-28 NOTE — Telephone Encounter (Signed)
He has

## 2014-10-28 NOTE — Telephone Encounter (Signed)
Spouse will make certqin patient is taking daily fiber, taking Miralax before meals. He reports Hyomax causing anxiety. Can I renew Robinul Forte? Appointment scheduled.

## 2014-10-28 NOTE — Telephone Encounter (Signed)
He is to determine whether he is taking fiber daily.  Before meals taking MiraLAX? Discontinue Librax.  Ask him to take hyomax twice a day.  He needs a follow-up office visit

## 2014-11-01 ENCOUNTER — Telehealth: Payer: Self-pay | Admitting: Pharmacist

## 2014-11-01 ENCOUNTER — Other Ambulatory Visit: Payer: PPO

## 2014-11-01 ENCOUNTER — Ambulatory Visit: Payer: PPO

## 2014-11-01 NOTE — Telephone Encounter (Signed)
Pt FTKA for lab/Coumadin clinic today.   I left vm for pt to call [facility] to reschedule. Kennith Center, Pharm.D., CPP 11/01/2014@4 :14 PM

## 2014-11-16 ENCOUNTER — Ambulatory Visit: Payer: Self-pay

## 2014-11-16 ENCOUNTER — Ambulatory Visit: Payer: PPO

## 2014-11-16 ENCOUNTER — Ambulatory Visit (HOSPITAL_BASED_OUTPATIENT_CLINIC_OR_DEPARTMENT_OTHER): Payer: PPO

## 2014-11-16 ENCOUNTER — Ambulatory Visit (HOSPITAL_BASED_OUTPATIENT_CLINIC_OR_DEPARTMENT_OTHER): Payer: PPO | Admitting: Pharmacist

## 2014-11-16 DIAGNOSIS — I2699 Other pulmonary embolism without acute cor pulmonale: Secondary | ICD-10-CM

## 2014-11-16 DIAGNOSIS — Z7901 Long term (current) use of anticoagulants: Secondary | ICD-10-CM

## 2014-11-16 LAB — PROTIME-INR
INR: 2.9 (ref 2.00–3.50)
Protime: 34.8 Seconds — ABNORMAL HIGH (ref 10.6–13.4)

## 2014-11-16 LAB — POCT INR: INR: 2.9

## 2014-11-16 NOTE — Progress Notes (Signed)
INR = 2.9  Pt states he is on Coumadin 7 mg daily. At his visit w/ [place] last month, we understood his Coumadin dose to be 7.5 mg daily except 5 mg on Thursday and Sunday.  Pt said his INR was high recently because he was taking a variety of tablet strengths. He has had med changes: increased Cymbalta (due to smoking cigarettes they tripled his dose).  He is also on Librax for stomach. He goes back to see Dr. Deatra Ina soon. INR therapeutic.  He will continue on his current dose of 7 mg daily We can repeat his INR in 1 month. Kennith Center, Pharm.D., CPP 11/16/2014@3 :06 PM

## 2014-11-22 ENCOUNTER — Encounter: Payer: Self-pay | Admitting: Gastroenterology

## 2014-11-22 ENCOUNTER — Ambulatory Visit (INDEPENDENT_AMBULATORY_CARE_PROVIDER_SITE_OTHER): Payer: PPO | Admitting: Gastroenterology

## 2014-11-22 VITALS — BP 118/80 | HR 88 | Ht 76.0 in | Wt 246.0 lb

## 2014-11-22 DIAGNOSIS — R1032 Left lower quadrant pain: Secondary | ICD-10-CM

## 2014-11-22 MED ORDER — ONDANSETRON HCL 4 MG PO TABS: 4.0000 mg | ORAL_TABLET | Freq: Three times a day (TID) | ORAL | Status: DC | PRN

## 2014-11-22 MED ORDER — OXYCODONE-ACETAMINOPHEN 10-325 MG PO TABS: 1.0000 | ORAL_TABLET | Freq: Four times a day (QID) | ORAL | Status: DC | PRN

## 2014-11-22 NOTE — Patient Instructions (Addendum)
You will be scheduled for an appointment at [facility] Address: [address], [place], [place] [address]  [phone]  Dr Deatra Ina has given you a prescription for pain medications.

## 2014-11-22 NOTE — Assessment & Plan Note (Signed)
Several month history of persistent, now diffuse abdominal pain that clearly is exacerbated postprandially.  Workup including endoscopy, colonoscopy, CT scan and lab work unrevealing except for vascular calcifications in the aorta.  I'm concerned that he may be suffering from chronic mesenteric ischemia.  He claims he is now losing weight and is afraid to eat with certainly is compatible with this diagnosis.  Recommendations #1 consult vascular surgery #2 analgesics-Percocet

## 2014-11-22 NOTE — Progress Notes (Signed)
      History of Present Illness:  Mr. Axel continues to complain of severe, diffuse abdominal pain.  Pain is constant but clearly exacerbated by eating.  He moves his bowels regularly.  He is afraid to eat because of pain.  He has lost 4 pounds since December.  He is requesting analgesics.  CT scan was unremarkable except for vascular calcifications in the aorta and its branches.    Review of Systems: Pertinent positive and negative review of systems were noted in the above HPI section. All other review of systems were otherwise negative.    Current Medications, Allergies, Past Medical History, Past Surgical History, Family History and Social History were reviewed in [facility] record  Vital signs were reviewed in today's medical record. Physical Exam: General: Well-developed well-nourished male in mild   Distress due to pain On abdominal exam there are no bruits.  There is minimal tenderness to deep palpation in the left lower quadrant.  There is no organomegaly.   See Assessment and Plan under Problem List

## 2014-11-23 ENCOUNTER — Other Ambulatory Visit: Payer: PPO

## 2014-11-24 ENCOUNTER — Encounter: Payer: Self-pay | Admitting: *Deleted

## 2014-11-27 ENCOUNTER — Telehealth: Payer: Self-pay | Admitting: Internal Medicine

## 2014-11-27 NOTE — Telephone Encounter (Signed)
The patient called today complaining of abdominal pain. He has had chronic abdominal pain for months (chart reviewed). Has undergone endoscopic procedures as well as CT scan. He states the pain is constant regardless of meals. He has had some mild weight loss. Dr. Deatra Ina suspects mesenteric ischemia. The patient states he is having pain despite oxycodone. No new problems this afternoon, however. He does not feel attending worse nor the need to go to the hospital. I told him that I could not resolve this for him this afternoon. However, I would forward this to Dr. Deatra Ina and his nurse. I told patient that our office will contact him for further recommendations per Dr. Deatra Ina.

## 2014-11-28 ENCOUNTER — Other Ambulatory Visit: Payer: Self-pay

## 2014-11-28 DIAGNOSIS — R109 Unspecified abdominal pain: Secondary | ICD-10-CM

## 2014-11-28 NOTE — Telephone Encounter (Signed)
Left a message for triage nurse at vascular surgery re: moving up OV from 12/06/14. Patient notified of recommendations. He will try increasing Percocet. He states his stomach is hurting if he eats or does not eat. He states he is sick to his stomach. Reports he was admitted to hospital in past for similar symptoms. States he will try increasing the pain meds but is just about to the point of not being able to tolerate it.

## 2014-11-28 NOTE — Telephone Encounter (Signed)
Received a call from vascular surgeon office. Patient has been moved up to tomorrow at 11:15 AM with Dr. Curt Jews. NPO after midnight. May take medications with a sip of water. Patient notified.

## 2014-11-28 NOTE — Telephone Encounter (Signed)
He can take percocet every 4-6h, 2 tabs at a time.  See if he can see vascular surgery ASAP

## 2014-11-29 ENCOUNTER — Ambulatory Visit (INDEPENDENT_AMBULATORY_CARE_PROVIDER_SITE_OTHER): Payer: PPO | Admitting: Vascular Surgery

## 2014-11-29 ENCOUNTER — Ambulatory Visit (HOSPITAL_COMMUNITY)
Admission: RE | Admit: 2014-11-29 | Discharge: 2014-11-29 | Disposition: A | Discharge status: Home / Self Care | Payer: PPO | Source: Ambulatory Visit | Attending: Vascular Surgery | Admitting: Vascular Surgery

## 2014-11-29 ENCOUNTER — Encounter: Payer: Self-pay | Admitting: Vascular Surgery

## 2014-11-29 VITALS — BP 111/73 | HR 66 | Resp 18 | Ht 74.0 in | Wt 247.0 lb

## 2014-11-29 DIAGNOSIS — R1084 Generalized abdominal pain: Secondary | ICD-10-CM

## 2014-11-29 DIAGNOSIS — R109 Unspecified abdominal pain: Secondary | ICD-10-CM | POA: Insufficient documentation

## 2014-11-29 NOTE — Progress Notes (Signed)
HISTORY AND PHYSICAL     CC:  Stomach pain Referring Provider:  Elwyn Reach, MD  HPI: This is a 64 y.o. male who states that he has had abdominal pain since last summer.  He states that if he ate he would get diarrhea and this later worsened with drinking water.  He drives a truck for a living and was in [place] when he states he got sick and was admitted to the hospital.  He states that they "cleaned him out" and gave him IV Abx.  He states he felt like a new man.  When he returned home, his stomach pain returned and was worse.  He was having pain when he would eat.  He states that he does have sinus drainage and feels like when that gets in his system, that is when he hurts.  He states that once that passes through, he feels better.  He did undergo EGD and colonoscopy and these were clear per the pt.    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  Pt was treated for H pylori, but Dr. Deatra Ina did not think that findings at endoscopy accounted for his sx.  He does not have hx of Etoh use or pancreatitis and his LFT's were unremarkable.    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    He did have a CT scan in February and is referred to our office for mesenteric duplex for possible mesenteric ischemia.   He is on a statin for hypercholesterolemia.    He does have a family hx of men in his family and stomach cancer.  Past Medical History  Diagnosis Date  . Hypertension   . History of blood clots   . Diverticulitis   . Hernia   . Diverticulosis   . Shingles   . Gout   . Back pain   . Neuropathy, peripheral 05/15/2012  . DVT (deep venous thrombosis)   . Anxiety and depression   . Nausea and vomiting     Past Surgical History  Procedure Laterality Date  . Back surgery    . Hernia repair    . Hiatal hernia repair    . Appendectomy    . Cholecystectomy  2008  . Sacral nerve stimulator  placement  2011  . Left heart catheterization with coronary angiogram N/A 08/02/2014    Procedure: LEFT HEART CATHETERIZATION WITH CORONARY ANGIOGRAM;  Surgeon: Laverda Page, MD;  Location: [facility] CATH LAB;  Service: Cardiovascular;  Laterality: N/A;    Allergies  Allergen Reactions  . Aspirin Other (See Comments)    ulcers  . Celecoxib Other (See Comments)    Stomach irritation  . Ibuprofen Other (See Comments)    ulcers  . Lyrica [Pregabalin] Swelling    Current Outpatient Prescriptions  Medication Sig Dispense Refill  . atorvastatin (LIPITOR) 20 MG tablet     . clidinium-chlordiazePOXIDE (LIBRAX) 5-2.5 MG per capsule Take 1 capsule by mouth 4 (four) times daily -  before meals and at bedtime.  0  . colchicine 0.6 MG tablet Take 0.6 mg by mouth 2 (two) times daily.     . diazepam (VALIUM) 5 MG tablet Take 5 mg by mouth daily.  0  . DULoxetine (CYMBALTA) 60 MG capsule Take 180 mg by mouth 3 (three) times daily.     . fluticasone (FLONASE) 50 MCG/ACT nasal spray     .  hydrALAZINE (APRESOLINE) 10 MG tablet Take 1 tablet by mouth 4 (four) times daily.    . hydrochlorothiazide (HYDRODIURIL) 25 MG tablet Take 25 mg by mouth daily.  0  . metoprolol tartrate (LOPRESSOR) 25 MG tablet Take 25 mg by mouth 2 (two) times daily.  0  . NIFEDICAL XL 60 MG 24 hr tablet Take 60 mg by mouth 2 (two) times daily.     Marland Kitchen omeprazole (PRILOSEC) 40 MG capsule Take 1 capsule twice daily before breakfast and dinner. 60 capsule 3  . ondansetron (ZOFRAN ODT) 8 MG disintegrating tablet Take 1 tablet (8 mg total) by mouth every 8 (eight) hours as needed for nausea or vomiting. 20 tablet 0  . oxyCODONE-acetaminophen (PERCOCET) 10-325 MG per tablet Take 1 tablet by mouth every 6 (six) hours as needed for pain. 40 tablet 0  . PROAIR HFA 108 (90 BASE) MCG/ACT inhaler Inhale 1 puff into the lungs every 6 (six) hours as needed for wheezing.     Marland Kitchen tiZANidine (ZANAFLEX) 4 MG tablet Take 4 mg by mouth every 6 (six) hours  as needed for muscle spasms.   0  . valsartan-hydrochlorothiazide (DIOVAN-HCT) 320-25 MG per tablet Take 1 tablet by mouth daily.    . VOLTAREN 1 % GEL Apply 2 g topically daily as needed. For pain    . warfarin (COUMADIN) 5 MG tablet Take 10mg  (2 tablets) by mouth on Sundays and thursdays. Take 7.5mg  (1.5 tablets) rest of days 50 tablet 3  . glycopyrrolate (ROBINUL) 2 MG tablet Take 1 tablet (2 mg total) by mouth 2 (two) times daily. (Patient not taking: Reported on 11/29/2014) 60 tablet 3  . ondansetron (ZOFRAN) 4 MG tablet Take 1 tablet (4 mg total) by mouth every 8 (eight) hours as needed for nausea or vomiting. (Patient not taking: Reported on 11/29/2014) 30 tablet 1  . oxyCODONE (OXY IR/ROXICODONE) 5 MG immediate release tablet Take 5 mg by mouth every 6 (six) hours as needed (pain).   0  . promethazine (PHENERGAN) 25 MG tablet Take 1 tablet (25 mg total) by mouth every 6 (six) hours as needed for nausea or vomiting. (Patient not taking: Reported on 11/29/2014) 30 tablet 2  . pseudoephedrine (SUDAFED) 120 MG 12 hr tablet Take 120 mg by mouth every 12 (twelve) hours as needed for congestion.    . [DISCONTINUED] isosorbide mononitrate (IMDUR) 60 MG 24 hr tablet Take 60 mg by mouth daily.     No current facility-administered medications for this visit.    Family History  Problem Relation Age of Onset  . Prostate cancer Father   . Heart disease Father   . Diabetes Mother   . Heart disease Mother   . Colon cancer Maternal Uncle   . Pancreatic cancer Paternal Uncle   . Prostate cancer Paternal Uncle   . Multiple sclerosis Daughter   . Prostate cancer Paternal Uncle     History   Social History  . Marital Status: Widowed    Spouse Name: Water quality scientist  . Number of Children: 3  . Years of Education: 12th   Occupational History  . unemployed Other  .     Social History Main Topics  . Smoking status: Current Every Day Smoker -- 0.50 packs/day for 40 years    Types: Cigarettes  .  Smokeless tobacco: Never Used  . Alcohol Use: No  . Drug Use: No  . Sexual Activity: Not on file   Other Topics Concern  . Not on file  Social History Narrative   Patient lives at home with his family.   Caffeine use: 1-2 cups daily     ROS: [x]  Positive   [ ]  Negative   [ ]  All sytems reviewed and are negative  Cardiovascular: []  chest pain/pressure []  palpitations []  SOB lying flat []  DOE [x]  pain in legs while walking [x]  pain in feet when lying flat [x]  hx of DVT-on coumadin; has IVC filter []  hx of phlebitis [x]  swelling in legs []  varicose veins  Pulmonary: []  productive cough []  asthma []  wheezing  Neurologic: [x]  weakness in arms/legs [x]  numbness in arms/legs [x] difficulty speaking or slurred speech []  temporary loss of vision in one eye [x]  dizziness  Hematologic: []  bleeding problems [x]  problems with blood clotting easily  GI []  vomiting blood [x]  blood in stool [x]  hx ruptured esophagus; hx hiatal hernia repair  GU: [x]  burning with urination []  blood in urine  Psychiatric: []  hx of major depression  Integumentary: []  rashes []  ulcers  Constitutional: []  fever [x]  chills   PHYSICAL EXAMINATION:  Filed Vitals:   11/29/14 1302  BP: 111/73  Pulse: 66  Resp: 18   Body mass index is 31.7 kg/(m^2).  General:  WDWN in NAD Gait: Not observed HENT: WNL, normocephalic Pulmonary: normal non-labored breathing , without Rales, rhonchi,  wheezing Cardiac: RRR, without  Murmurs, rubs or gallops; without carotid bruits Abdomen: soft, NT, no masses; mild tenderness to palpation LLQ Skin: without rashes, without ulcers  Vascular Exam/Pulses:  Right Left  Radial 2+ (normal) 2+ (normal)  Femoral 2+ (normal) 2+ (normal)  DP 2+ (normal) 2+ (normal)   Extremities: without ischemic changes, without Gangrene , without cellulitis; without open wounds;  Musculoskeletal: no muscle wasting or atrophy  Neurologic: A&O X 3; Appropriate Affect ;  SENSATION: normal; MOTOR FUNCTION:  moving all extremities equally. Speech is fluent/normal   Non-Invasive Vascular Imaging:   Mssenteric Artery duplex 11/29/14: Technically difficult study due to body habitus; less than 70% stenosis, no noted velocity noted.  CTA Abdomen/Pelvis 10/17/14: IMPRESSION: No evidence of bowel obstruction. Status post appendectomy.  Status post cholecystectomy. IVC filter. Left inguinal hernia repair. Thoracic spine stimulator.  No CT findings to account for the patient's abdominal pain.  Pt meds includes: Statin:  Yes.   Beta Blocker:  Yes.   Aspirin:  No. ACEI:  No. ARB:  No. Other Antiplatelet/Anticoagulant:  Yes.   Coumadin   ASSESSMENT/PLAN:: 64 y.o. male with chronic abdominal pain referred to [facility] for ultrasound for possible mesenteric ischemia.   -pt CTA reviewed and celiac artery, SMA, and IMA are all patent and no evidence of mesenteric ischemia.  His mesenteric duplex today is also unremarkable and not the source of his abdominal pain.  -he will follow up with Dr. Deatra Ina for further workup.     Leontine Locket, PA-C [facility] [phone]  Clinic MD:  Pt seen and examined in conjunction with Dr. Donnetta Hutching  I have examined the patient, reviewed and agree with above. Unusual complex of symptoms. Reports feeling of distention and left lower quadrant and will have bowel movement with some relief. Has chronic discomfort but not related to eating. Did review both his CT scan from one month ago and today's mesenteric arterial duplex with the patient and his wife present. No evidence of vascular etiology for his pain. Will follow-up with GI and medical doctor and see [place] on an as-needed basis  EARLY, TODD, MD 11/29/2014 1:58 PM

## 2014-11-30 ENCOUNTER — Telehealth: Payer: Self-pay | Admitting: Gastroenterology

## 2014-11-30 NOTE — Telephone Encounter (Signed)
Spoke with [name] [name]. Patient talking to her during the telephone call. He has seen Vascular. He has been told his pain is not due to mesenteric ischemia. He has been told to return to GI. He continues to have LLQ pain. Using Miralax every third day. Wife says he is a "grumpy cat" because he does not feel well.

## 2014-12-05 ENCOUNTER — Ambulatory Visit: Payer: PPO | Admitting: Gastroenterology

## 2014-12-06 ENCOUNTER — Other Ambulatory Visit (HOSPITAL_COMMUNITY): Payer: PPO

## 2014-12-06 ENCOUNTER — Encounter: Payer: PPO | Admitting: Vascular Surgery

## 2014-12-06 ENCOUNTER — Telehealth: Payer: Self-pay | Admitting: Pharmacist

## 2014-12-06 NOTE — Telephone Encounter (Signed)
[name] [name] called this afternoon stating that he had been prescribed Meloxicam once daily x 1 month due to inflammatory GI issues. He started this on 12/06/14. He is scheduled for coumadin clinic next week on 12/15/14. I instructed patient to monitor for increased bruising/bleeding and to call us immediately or go to the ER if these symptoms occur. We will assess next week in coumadin clinic  Thank you,  Montel Clock, PharmD, BCOP

## 2014-12-07 ENCOUNTER — Ambulatory Visit: Payer: PPO | Admitting: Physician Assistant

## 2014-12-07 ENCOUNTER — Other Ambulatory Visit: Payer: PPO

## 2014-12-09 ENCOUNTER — Ambulatory Visit: Payer: PPO | Admitting: Gastroenterology

## 2014-12-15 ENCOUNTER — Ambulatory Visit: Payer: PPO

## 2014-12-15 ENCOUNTER — Other Ambulatory Visit: Payer: PPO

## 2014-12-16 ENCOUNTER — Ambulatory Visit (HOSPITAL_BASED_OUTPATIENT_CLINIC_OR_DEPARTMENT_OTHER): Payer: PPO | Admitting: Pharmacist

## 2014-12-16 ENCOUNTER — Other Ambulatory Visit (HOSPITAL_BASED_OUTPATIENT_CLINIC_OR_DEPARTMENT_OTHER): Payer: PPO

## 2014-12-16 DIAGNOSIS — Z7901 Long term (current) use of anticoagulants: Secondary | ICD-10-CM

## 2014-12-16 DIAGNOSIS — I2699 Other pulmonary embolism without acute cor pulmonale: Secondary | ICD-10-CM | POA: Diagnosis not present

## 2014-12-16 LAB — PROTIME-INR
INR: 1.4 — ABNORMAL LOW (ref 2.00–3.50)
Protime: 16.8 Seconds — ABNORMAL HIGH (ref 10.6–13.4)

## 2014-12-16 LAB — POCT INR: INR: 1.4

## 2014-12-16 MED ORDER — WARFARIN SODIUM 1 MG PO TABS: ORAL_TABLET | ORAL | Status: DC

## 2014-12-16 NOTE — Progress Notes (Signed)
INR = 1.4 Pt missed dose of Coumadin last night.  He is currently on 7 mg daily (takes 1x 5 mg and 2x 1 mg tabs) He reports his diet has changed- eating more turnip greens in past week. He c/o L shoulder pain & swelling in shoulder blades.  This first occurred 1 month ago, approx.  His dtr in [place] who has MS fell and he had to lift her up.  The pain started then and 1 week after his dtrs fall, he noticed swelling. Pt states the swelling is maybe a little less.  He uses a heating pad at night and he also takes hot showers.  The Meloxicam has not helped. He had an xray of the area and it showed "bone spurs".  He is awaiting prior auth from insur to see orthopedist soon. No report of bleeding/bruising. INR low today.  Take Coumadin 10 mg x 2 days only then resume 7 mg daily. Return in 2 weeks. Pt requested refill of Coumadin 1 mg tabs today.  Refilled. Kennith Center, Pharm.D., CPP 12/16/2014@2 :50 PM

## 2014-12-21 ENCOUNTER — Other Ambulatory Visit: Payer: PPO

## 2014-12-30 ENCOUNTER — Telehealth: Payer: Self-pay | Admitting: Pharmacist

## 2014-12-30 ENCOUNTER — Other Ambulatory Visit: Payer: PPO

## 2014-12-30 ENCOUNTER — Ambulatory Visit: Payer: PPO

## 2014-12-30 NOTE — Telephone Encounter (Signed)
Patient FTKA today for lab and coumadin clinic. I called patient cell phone and left VM for patient to call back to reschedule these appointments  Thank you,  Montel Clock, PharmD, BCOP

## 2015-01-02 ENCOUNTER — Telehealth: Payer: Self-pay | Admitting: Pharmacist

## 2015-01-02 NOTE — Telephone Encounter (Signed)
Pt called [facility] to resched missed lab/CC appt from 12/30/14. He will come Fri 01/06/15 at 1 pm for lab; 1:15 pm for CC. Kennith Center, Pharm.D., CPP 01/02/2015@3 :46 PM

## 2015-01-06 ENCOUNTER — Other Ambulatory Visit (HOSPITAL_BASED_OUTPATIENT_CLINIC_OR_DEPARTMENT_OTHER): Payer: PPO

## 2015-01-06 ENCOUNTER — Ambulatory Visit (HOSPITAL_BASED_OUTPATIENT_CLINIC_OR_DEPARTMENT_OTHER): Payer: PPO | Admitting: Pharmacist

## 2015-01-06 DIAGNOSIS — I2699 Other pulmonary embolism without acute cor pulmonale: Secondary | ICD-10-CM | POA: Diagnosis not present

## 2015-01-06 DIAGNOSIS — Z7901 Long term (current) use of anticoagulants: Secondary | ICD-10-CM

## 2015-01-06 LAB — PROTIME-INR
INR: 4.9 — ABNORMAL HIGH (ref 2.00–3.50)
Protime: 58.8 Seconds — ABNORMAL HIGH (ref 10.6–13.4)

## 2015-01-06 LAB — POCT INR: INR: 4.9

## 2015-01-06 NOTE — Progress Notes (Signed)
INR = 4.9 Pt deviated from dosing - he states he held 2 days of Coumadin after last visit when his INR = 1.4. He thinks he may have taken up to 4 days of 7.5 mg daily recently.  He attributes this to his pain issues. No bleeding.   He continues to have shoulder pain & knots along the shoulders.  He has upcoming appt w/ Dr. Belenda Cruise "Nicki Reaper" [facility] w/ [place] for possible steroid injection.  This appt is pending.  He had a "PET scan" at [facility] recently. Pt still using Meloxicam (up to 3 tabs/day = 15 mg total; MAX) but states this does not help.   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 INR elevated.  Hold Coumadin x 2 days then restart 7 mg/day. Return in 10 days for repeat protime. Pt knows to keep [place] & ortho MD aware of potential for bridging Coumadin around steroid inj. Kennith Center, Pharm.D., CPP 01/06/2015@1 :35 PM

## 2015-01-16 ENCOUNTER — Other Ambulatory Visit: Payer: PPO

## 2015-01-16 ENCOUNTER — Telehealth: Payer: Self-pay | Admitting: Pharmacist

## 2015-01-16 ENCOUNTER — Ambulatory Visit: Payer: PPO

## 2015-01-16 NOTE — Telephone Encounter (Signed)
LVM on home and mobile number Need to call back and r/s his missed appmt 01/16/15

## 2015-01-19 ENCOUNTER — Other Ambulatory Visit (HOSPITAL_BASED_OUTPATIENT_CLINIC_OR_DEPARTMENT_OTHER): Payer: PPO

## 2015-01-19 ENCOUNTER — Ambulatory Visit (HOSPITAL_BASED_OUTPATIENT_CLINIC_OR_DEPARTMENT_OTHER): Payer: Self-pay | Admitting: Pharmacist

## 2015-01-19 DIAGNOSIS — I2699 Other pulmonary embolism without acute cor pulmonale: Secondary | ICD-10-CM | POA: Diagnosis not present

## 2015-01-19 DIAGNOSIS — R42 Dizziness and giddiness: Secondary | ICD-10-CM

## 2015-01-19 DIAGNOSIS — G43909 Migraine, unspecified, not intractable, without status migrainosus: Secondary | ICD-10-CM

## 2015-01-19 DIAGNOSIS — Z7901 Long term (current) use of anticoagulants: Secondary | ICD-10-CM

## 2015-01-19 LAB — PROTIME-INR
INR: 2 (ref 2.00–3.50)
Protime: 24 Seconds — ABNORMAL HIGH (ref 10.6–13.4)

## 2015-01-19 LAB — POCT INR: INR: 2

## 2015-01-19 NOTE — Progress Notes (Signed)
INR at goal today at 2 (goal 2-3) *[name] pt* No bleeding or bruising to report No missed or extra doses He continues to have shoulder pain & knots along the shoulders. He has upcoming appt w/ Dr. Belenda Cruise "[name] [name]" [name] [name] w/ [facility] next week on 5/16 He will keep [place] updated on any plans for steroid injection for bridging coumadin No other medication or diet changes He would like to start working again soon Plan: No changes  continue Coumadin 7 mg daily  Recheck on 02/10/15 at 1:15 pm for lab and 1:30 pm for Coumadin clinic

## 2015-01-19 NOTE — Patient Instructions (Signed)
INR at goal No changes  continue Coumadin 7 mg daily  Recheck on 02/10/15 at 1:15 pm for lab and 1:30 pm for Coumadin clinic

## 2015-01-30 ENCOUNTER — Ambulatory Visit: Payer: PPO | Admitting: Gastroenterology

## 2015-02-10 ENCOUNTER — Other Ambulatory Visit (HOSPITAL_BASED_OUTPATIENT_CLINIC_OR_DEPARTMENT_OTHER): Payer: PPO

## 2015-02-10 ENCOUNTER — Ambulatory Visit (HOSPITAL_BASED_OUTPATIENT_CLINIC_OR_DEPARTMENT_OTHER): Payer: PPO | Admitting: Pharmacist

## 2015-02-10 DIAGNOSIS — I2699 Other pulmonary embolism without acute cor pulmonale: Secondary | ICD-10-CM | POA: Diagnosis not present

## 2015-02-10 DIAGNOSIS — Z7901 Long term (current) use of anticoagulants: Secondary | ICD-10-CM

## 2015-02-10 LAB — PROTIME-INR
INR: 3.4 (ref 2.00–3.50)
Protime: 40.8 Seconds — ABNORMAL HIGH (ref 10.6–13.4)

## 2015-02-10 LAB — POCT INR: INR: 3.4

## 2015-02-10 NOTE — Patient Instructions (Signed)
INR right above goal Take 5 mg today only then Continue Coumadin 7 mg daily except for 10 mg on Thursday and Sunday Recheck on 03/03/15 at 1:30 pm for lab and 1:45 pm for Coumadin clinic

## 2015-02-10 NOTE — Progress Notes (Signed)
INR right above goal today at 3.4 (goal 2-3) Pt is doing well but still with shoulder/neck pain and some abdominal pain that has been chronic We have documented that patient has been on 7 mg daily for the last several months Pt states he has actually been taking 10 mg on Thursdays and Sundays and 7 mg all other days No unusual bleeding or bruising No other diet or medication changes Plan for now: Take 5 mg today only then Continue Coumadin 7 mg daily except for 10 mg on Thursday and Sunday Recheck on 03/03/15 at 1:30 pm for lab and 1:45 pm for Coumadin clinic

## 2015-02-13 ENCOUNTER — Ambulatory Visit: Payer: PPO | Admitting: Gastroenterology

## 2015-02-15 ENCOUNTER — Telehealth: Payer: Self-pay | Admitting: Gastroenterology

## 2015-02-15 NOTE — Telephone Encounter (Signed)
He had run out of omeprazole and zofran. He has been back on them for about 24 hours. He reports bending over can cause reflux. The diarrhea is resolved. Appointment made with the understanding he will go to urgent care or ER if he acutely worsens. Appointment for evaluation presently scheduled for 02/27/15.

## 2015-02-27 ENCOUNTER — Encounter: Payer: Self-pay | Admitting: Gastroenterology

## 2015-02-27 ENCOUNTER — Telehealth: Payer: Self-pay

## 2015-02-27 ENCOUNTER — Ambulatory Visit (INDEPENDENT_AMBULATORY_CARE_PROVIDER_SITE_OTHER): Payer: PPO | Admitting: Gastroenterology

## 2015-02-27 VITALS — BP 124/78 | HR 72 | Ht 76.0 in | Wt 249.8 lb

## 2015-02-27 DIAGNOSIS — R1032 Left lower quadrant pain: Secondary | ICD-10-CM

## 2015-02-27 DIAGNOSIS — R110 Nausea: Secondary | ICD-10-CM | POA: Diagnosis not present

## 2015-02-27 DIAGNOSIS — R1084 Generalized abdominal pain: Secondary | ICD-10-CM

## 2015-02-27 NOTE — Assessment & Plan Note (Signed)
Patient has persistent upper abdominal pain that radiates to the lower abdomen and nausea.  Symptoms are clearly exacerbated postprandially.  Etiology has not been determined.  Workup including upper endoscopy,  CT and CTA were negative.  Lab work was unrevealing.  Nausea could be a narcotic effect.  Gastroparesis should be ruled out.    Recommendations #1 gastric emptying scan

## 2015-02-27 NOTE — Patient Instructions (Addendum)
You have been scheduled for a gastric emptying scan at [facility] 03/14/2015    At 11am . Please arrive at least 15 minutes prior to your appointment for registration. Please make certain not to have anything to eat or drink after midnight the night before your test. Hold all stomach medications (ex: Zofran, phenergan, Reglan) 48 hours prior to your test. If you need to reschedule your appointment, please contact radiology scheduling at [phone]. _____________________________________________________________________ A gastric-emptying study measures how long it takes for food to move through your stomach. There are several ways to measure stomach emptying. In the most common test, you eat food that contains a small amount of radioactive material. A scanner that detects the movement of the radioactive material is placed over your abdomen to monitor the rate at which food leaves your stomach. This test normally takes about 2 hours to complete. _____________________________________________________________________  Discontinue Priolsec  Use Miralax daily

## 2015-02-27 NOTE — Telephone Encounter (Signed)
lvm that he has coumadin clinic appt of Friday 6/24. He has not seen a hematologist since 12/06/13. Dr Azucena Freed office number given to pt for him to call and make an appt. Asked pt to call if he needs refill on his warfarin before his CC visit 6/24.

## 2015-02-27 NOTE — Progress Notes (Signed)
      History of Present Illness:  [name] [name] continues to complain of abdominal pain and nausea.  Pain may begin in his upper abdomen and radiate to the lower abdomen.  Nausea is almost constant.  Both symptoms worsened postprandially immediately upon eating.  He is constipated.  He takes opiate avoid analgesics for chronic shoulder pain.  CTA was negative for significant vascular disease.  Weight actually has been stable.    Review of Systems: Pertinent positive and negative review of systems were noted in the above HPI section. All other review of systems were otherwise negative.    Current Medications, Allergies, Past Medical History, Past Surgical History, Family History and Social History were reviewed in [facility] record  Vital signs were reviewed in today's medical record. Physical Exam: General: Well developed , well nourished, no acute distress Abdomen is without masses, tenderness organomegaly.  There is no succussion splash  See Assessment and Plan under Problem List

## 2015-03-03 ENCOUNTER — Ambulatory Visit (HOSPITAL_BASED_OUTPATIENT_CLINIC_OR_DEPARTMENT_OTHER): Payer: PPO | Admitting: Pharmacist

## 2015-03-03 ENCOUNTER — Other Ambulatory Visit (HOSPITAL_BASED_OUTPATIENT_CLINIC_OR_DEPARTMENT_OTHER): Payer: PPO

## 2015-03-03 DIAGNOSIS — I2699 Other pulmonary embolism without acute cor pulmonale: Secondary | ICD-10-CM

## 2015-03-03 DIAGNOSIS — Z7901 Long term (current) use of anticoagulants: Secondary | ICD-10-CM

## 2015-03-03 LAB — PROTIME-INR
INR: 2.3 (ref 2.00–3.50)
Protime: 27.6 Seconds — ABNORMAL HIGH (ref 10.6–13.4)

## 2015-03-03 LAB — POCT INR: INR: 2.3

## 2015-03-03 MED ORDER — WARFARIN SODIUM 5 MG PO TABS: ORAL_TABLET | ORAL | Status: DC

## 2015-03-03 NOTE — Patient Instructions (Signed)
INR at goal No changes  Continue Coumadin 7 mg daily except for 10 mg on Thursday and Sunday Recheck on 03/31/15 at 1:30 pm for lab and 1:45 pm for Coumadin clinic

## 2015-03-03 NOTE — Progress Notes (Signed)
INR at goal today at 2.3 (goal 2-3) *No charge coumadin clinic patient - [name] pt* Pt is doing well He is still have shoulder pain. He recently had a cortisone shot in his right shoulder No missed or extra doses No diet or medication changes No unusual bleeding or bruising No changes  Continue Coumadin 7 mg daily except for 10 mg on Thursday and Sunday Recheck on 03/31/15 at 1:30 pm for lab and 1:45 pm for Coumadin clinic

## 2015-03-14 ENCOUNTER — Ambulatory Visit (HOSPITAL_COMMUNITY)
Admission: RE | Admit: 2015-03-14 | Discharge: 2015-03-14 | Disposition: A | Discharge status: Home / Self Care | Payer: PPO | Source: Ambulatory Visit | Attending: Gastroenterology | Admitting: Gastroenterology

## 2015-03-14 DIAGNOSIS — R110 Nausea: Secondary | ICD-10-CM

## 2015-03-14 DIAGNOSIS — R1084 Generalized abdominal pain: Secondary | ICD-10-CM

## 2015-03-15 ENCOUNTER — Encounter (HOSPITAL_COMMUNITY): Admission: RE | Admit: 2015-03-15 | Payer: PPO | Source: Ambulatory Visit

## 2015-03-17 ENCOUNTER — Other Ambulatory Visit: Payer: Self-pay | Admitting: *Deleted

## 2015-03-17 DIAGNOSIS — R1032 Left lower quadrant pain: Secondary | ICD-10-CM

## 2015-03-17 MED ORDER — ONDANSETRON HCL 4 MG PO TABS: 4.0000 mg | ORAL_TABLET | Freq: Three times a day (TID) | ORAL | Status: DC | PRN

## 2015-03-17 NOTE — Telephone Encounter (Signed)
Zofran sent to pharmacy per request from [facility]

## 2015-03-23 ENCOUNTER — Ambulatory Visit (HOSPITAL_COMMUNITY)
Admission: RE | Admit: 2015-03-23 | Discharge: 2015-03-23 | Disposition: A | Discharge status: Home / Self Care | Payer: PPO | Source: Ambulatory Visit | Attending: Gastroenterology | Admitting: Gastroenterology

## 2015-03-23 DIAGNOSIS — R109 Unspecified abdominal pain: Secondary | ICD-10-CM | POA: Diagnosis not present

## 2015-03-23 MED ORDER — TECHNETIUM TC 99M SULFUR COLLOID
2.0000 | Freq: Once | INTRAVENOUS | Status: AC | PRN
  Administered 2015-03-23: 2 via ORAL

## 2015-03-31 ENCOUNTER — Other Ambulatory Visit: Payer: PPO

## 2015-03-31 ENCOUNTER — Ambulatory Visit: Payer: PPO

## 2015-03-31 ENCOUNTER — Telehealth: Payer: Self-pay | Admitting: Pharmacist

## 2015-03-31 NOTE — Telephone Encounter (Signed)
Spoke with wife and rescheduled his CC appmt for 04/05/15 at 2:30

## 2015-04-05 ENCOUNTER — Other Ambulatory Visit (HOSPITAL_BASED_OUTPATIENT_CLINIC_OR_DEPARTMENT_OTHER): Payer: PPO

## 2015-04-05 ENCOUNTER — Ambulatory Visit (HOSPITAL_BASED_OUTPATIENT_CLINIC_OR_DEPARTMENT_OTHER): Payer: PPO | Admitting: Pharmacist

## 2015-04-05 DIAGNOSIS — I2699 Other pulmonary embolism without acute cor pulmonale: Secondary | ICD-10-CM | POA: Diagnosis not present

## 2015-04-05 DIAGNOSIS — Z7901 Long term (current) use of anticoagulants: Secondary | ICD-10-CM

## 2015-04-05 LAB — PROTIME-INR
INR: 2.1 (ref 2.00–3.50)
Protime: 25.2 Seconds — ABNORMAL HIGH (ref 10.6–13.4)

## 2015-04-05 LAB — POCT INR: INR: 2.1

## 2015-04-05 NOTE — Patient Instructions (Signed)
Continue Coumadin 7 mg daily except for 10 mg on Thursday and Sunday Recheck on 05/03/15 at 2:45 pm for lab and 3:00 pm for Coumadin clinic

## 2015-04-05 NOTE — Progress Notes (Signed)
Pt seen in clinic today INR=2.1 Pt had no changes to meds He still complains of neck pain and no one can seem to get his pain meds right Gave pt number to Dr Darnell Level office as he is due for an annual visit, last was 2014 He agreed and said he would call Encouraged with the change in his daughters care for her MS, he switched from [place], [place] to [place] No other changes to report  Continue Coumadin 7 mg daily except for 10 mg on Thursday and Sunday Recheck on 05/03/15 at 2:45 pm for lab and 3:00 pm for Coumadin clinic

## 2015-04-07 ENCOUNTER — Other Ambulatory Visit: Payer: Self-pay | Admitting: Specialist

## 2015-04-07 DIAGNOSIS — G8929 Other chronic pain: Secondary | ICD-10-CM

## 2015-04-07 DIAGNOSIS — M542 Cervicalgia: Secondary | ICD-10-CM

## 2015-04-07 DIAGNOSIS — M545 Low back pain, unspecified: Secondary | ICD-10-CM

## 2015-04-07 DIAGNOSIS — M546 Pain in thoracic spine: Secondary | ICD-10-CM

## 2015-04-10 ENCOUNTER — Telehealth: Payer: Self-pay | Admitting: Pharmacist

## 2015-04-10 ENCOUNTER — Other Ambulatory Visit: Payer: Self-pay | Admitting: *Deleted

## 2015-04-10 MED ORDER — GLYCOPYRROLATE 2 MG PO TABS: 2.0000 mg | ORAL_TABLET | Freq: Two times a day (BID) | ORAL | Status: DC

## 2015-04-10 NOTE — Telephone Encounter (Addendum)
Pt called and stated that he needed an appt to have his INR checked on 04/17/15 prior to his Total Myelogram (scheduled at 12pm). He is stopping his coumadin 4 days prior to procedure on 04/13/15 - Dr Beryle Beams is aware. Pt is aware that he is to resume his coumadin the evening of his procedure. He will resume Lovenox 24 hours post procedure. Pt will pick up Lovenox samples when he is here on 04/17/15. He will need to set up f/u INR check post procedure (on 8/12 or 04/24/15) when pt is here on 04/17/15.

## 2015-04-17 ENCOUNTER — Ambulatory Visit (HOSPITAL_BASED_OUTPATIENT_CLINIC_OR_DEPARTMENT_OTHER): Payer: PPO | Admitting: Pharmacist

## 2015-04-17 ENCOUNTER — Ambulatory Visit
Admission: RE | Admit: 2015-04-17 | Discharge: 2015-04-17 | Disposition: A | Discharge status: Home / Self Care | Payer: PPO | Source: Ambulatory Visit | Attending: Specialist | Admitting: Specialist

## 2015-04-17 ENCOUNTER — Other Ambulatory Visit (HOSPITAL_BASED_OUTPATIENT_CLINIC_OR_DEPARTMENT_OTHER): Payer: PPO

## 2015-04-17 DIAGNOSIS — G8929 Other chronic pain: Secondary | ICD-10-CM

## 2015-04-17 DIAGNOSIS — M546 Pain in thoracic spine: Secondary | ICD-10-CM

## 2015-04-17 DIAGNOSIS — M542 Cervicalgia: Secondary | ICD-10-CM

## 2015-04-17 DIAGNOSIS — M545 Low back pain, unspecified: Secondary | ICD-10-CM

## 2015-04-17 DIAGNOSIS — I2699 Other pulmonary embolism without acute cor pulmonale: Secondary | ICD-10-CM

## 2015-04-17 DIAGNOSIS — Z7901 Long term (current) use of anticoagulants: Secondary | ICD-10-CM

## 2015-04-17 LAB — PROTIME-INR
INR: 1.1 — ABNORMAL LOW (ref 2.00–3.50)
Protime: 13.2 Seconds (ref 10.6–13.4)

## 2015-04-17 NOTE — Discharge Instructions (Signed)
Myelogram Discharge Instructions  1. Go home and rest quietly for the next 24 hours.  It is important to lie flat for the next 24 hours.  Get up only to go to the restroom.  You may lie in the bed or on a couch on your back, your stomach, your left side or your right side.  You may have one pillow under your head.  You may have pillows between your knees while you are on your side or under your knees while you are on your back.  2. DO NOT drive today.  Recline the seat as far back as it will go, while still wearing your seat belt, on the way home.  3. You may get up to go to the bathroom as needed.  You may sit up for 10 minutes to eat.  You may resume your normal diet and medications unless otherwise indicated.  Drink lots of extra fluids today and tomorrow.  4. The incidence of headache, nausea, or vomiting is about 5% (one in 20 patients).  If you develop a headache, lie flat and drink plenty of fluids until the headache goes away.  Caffeinated beverages may be helpful.  If you develop severe nausea and vomiting or a headache that does not go away with flat bed rest, call [phone].  5. You may resume normal activities after your 24 hours of bed rest is over; however, do not exert yourself strongly or do any heavy lifting tomorrow. If when you get up you have a headache when standing, go back to bed and force fluids for another 24 hours.  6. Call your physician for a follow-up appointment.  The results of your myelogram will be sent directly to your physician by the following day.  7. If you have any questions or if complications develop after you arrive home, please call [phone].  Discharge instructions have been explained to the patient.  The patient, or the person responsible for the patient, fully understands these instructions.        MAY RESUME WARFARIN TODAY.   May resume Cymbalta on Aug. 9, 2016, after 1:00 pm.

## 2015-04-17 NOTE — Progress Notes (Signed)
Pt came today for myelogram. States he is no longer having pain and wanted to know if he could cancel. Encouraged pt to resume warfarin and cymbalta. Dr. Otho Ket office made aware of cancellation and the reasons for it. Pt states all his pain was coming from an incorrect setting on his nerve stimulator.

## 2015-04-17 NOTE — Progress Notes (Signed)
**  [name] pt** **No charge pt**  INR 1.1 today after holding coumadin for total myelogram today.  [name] [name] will resume coumadin today at 10mg  Th/Sun and 7mg  other days.  He will start Lovenox 170mg  daily tomorrow.  Will check PT/INR on Friday 04/21/15.

## 2015-04-18 ENCOUNTER — Encounter: Payer: Self-pay | Admitting: Gastroenterology

## 2015-04-18 ENCOUNTER — Ambulatory Visit (INDEPENDENT_AMBULATORY_CARE_PROVIDER_SITE_OTHER): Payer: PPO | Admitting: Gastroenterology

## 2015-04-18 VITALS — BP 142/92 | HR 85 | Ht 76.0 in | Wt 244.1 lb

## 2015-04-18 DIAGNOSIS — R1032 Left lower quadrant pain: Secondary | ICD-10-CM | POA: Diagnosis not present

## 2015-04-18 NOTE — Assessment & Plan Note (Signed)
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  Symptoms have resolved since replacing the device.  Constipation has resolved as well as nausea since he stopped taking narcotics.

## 2015-04-18 NOTE — Progress Notes (Signed)
      History of Present Illness:  Mr. Stickles longer has abdominal pain or nausea.  He determined that the external nerve at her was malfunctioning and was actually throwing very high currents.  As soon as this was adjusted his pain vanished.  He stopped using narcotics and nausea has subsided.  He is moving his bowels regularly.    Review of Systems: Pertinent positive and negative review of systems were noted in the above HPI section. All other review of systems were otherwise negative.    Current Medications, Allergies, Past Medical History, Past Surgical History, Family History and Social History were reviewed in [facility] record  Vital signs were reviewed in today's medical record. Physical Exam: General: Well developed , well nourished, no acute distress   See Assessment and Plan under Problem List

## 2015-04-18 NOTE — Patient Instructions (Signed)
Follow up as needed

## 2015-04-21 ENCOUNTER — Other Ambulatory Visit: Payer: PPO

## 2015-04-21 ENCOUNTER — Ambulatory Visit: Payer: PPO

## 2015-04-21 ENCOUNTER — Telehealth: Payer: Self-pay | Admitting: Pharmacist

## 2015-04-21 NOTE — Telephone Encounter (Signed)
Pt forgot appointments today for lab and coumadin clinic. Rescheduled to next week on Tuesday 04/25/15

## 2015-04-25 ENCOUNTER — Other Ambulatory Visit (HOSPITAL_BASED_OUTPATIENT_CLINIC_OR_DEPARTMENT_OTHER): Payer: PPO

## 2015-04-25 ENCOUNTER — Ambulatory Visit (HOSPITAL_BASED_OUTPATIENT_CLINIC_OR_DEPARTMENT_OTHER): Payer: PPO | Admitting: Pharmacist

## 2015-04-25 DIAGNOSIS — Z7901 Long term (current) use of anticoagulants: Secondary | ICD-10-CM

## 2015-04-25 DIAGNOSIS — I2699 Other pulmonary embolism without acute cor pulmonale: Secondary | ICD-10-CM | POA: Diagnosis not present

## 2015-04-25 LAB — PROTIME-INR
INR: 2 (ref 2.00–3.50)
Protime: 24 Seconds — ABNORMAL HIGH (ref 10.6–13.4)

## 2015-04-25 LAB — POCT INR: INR: 2

## 2015-04-25 NOTE — Progress Notes (Signed)
NO CHARGE - [name] pt INR = 2 Pt on Lovenox 170 mg SQ daily s/p total myelogram 04/17/15.  INR subtherapeutic last visit due to being off Coumadin for this procedure for 4 days. He states he did not take Coumadin last night because he had bleeding at inj site & thought maybe his INR was high. He goes to pain clinic tomorrow Scheduled to see [name] 05/09/15 - will need to begin transitioning to internal med for INR checks at that time. INR today is therapeutic but on low end.  Since he missed Coumadin last night, his INR may drop slightly in next 24-48 hours so I've instructed him to go ahead and take Lovenox this evening (last syringe he has at home anyway). He will continue Coumadin 7 mg/day except 10 mg on Thurs/Sun. Return in 3 weeks. Kennith Center, Pharm.D., CPP 04/25/2015@2 :14 PM

## 2015-04-28 ENCOUNTER — Other Ambulatory Visit: Payer: Self-pay | Admitting: Medical Oncology

## 2015-05-03 ENCOUNTER — Ambulatory Visit: Payer: PPO

## 2015-05-03 ENCOUNTER — Other Ambulatory Visit: Payer: PPO

## 2015-05-09 ENCOUNTER — Ambulatory Visit (INDEPENDENT_AMBULATORY_CARE_PROVIDER_SITE_OTHER): Payer: PPO | Admitting: Oncology

## 2015-05-09 ENCOUNTER — Encounter: Payer: Self-pay | Admitting: Oncology

## 2015-05-09 VITALS — BP 141/80 | HR 92 | Temp 98.2°F | Ht 76.0 in | Wt 244.4 lb

## 2015-05-09 DIAGNOSIS — Z86711 Personal history of pulmonary embolism: Secondary | ICD-10-CM | POA: Diagnosis not present

## 2015-05-09 DIAGNOSIS — D688 Other specified coagulation defects: Secondary | ICD-10-CM

## 2015-05-09 DIAGNOSIS — Z86718 Personal history of other venous thrombosis and embolism: Secondary | ICD-10-CM | POA: Diagnosis not present

## 2015-05-09 DIAGNOSIS — I82409 Acute embolism and thrombosis of unspecified deep veins of unspecified lower extremity: Secondary | ICD-10-CM

## 2015-05-09 DIAGNOSIS — Z95828 Presence of other vascular implants and grafts: Secondary | ICD-10-CM

## 2015-05-09 DIAGNOSIS — I2699 Other pulmonary embolism without acute cor pulmonale: Secondary | ICD-10-CM

## 2015-05-09 DIAGNOSIS — Z7901 Long term (current) use of anticoagulants: Secondary | ICD-10-CM

## 2015-05-09 NOTE — Progress Notes (Signed)
Patient ID: [name] [name], male   DOB: 01-06-1951, 64 y.o.   MRN: [id number] Hematology and Oncology Follow Up Visit  [name] [name] [id number] [birth year] 64 y.o. 05/09/2015 1:35 PM   Principle Diagnosis: Encounter Diagnoses  Name Primary?  . Pulmonary embolism Yes  . DVT (deep venous thrombosis), unspecified laterality   Clinical Summary: 64 year old man referred  in August of 2011 for advice on long-term anticoagulation. He has a complex clotting history. He had bilateral DVTs and bilateral pulmonary emboli in 1990. Not documented in our hospital records. 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 He had an additional right lower extremity DVT in 2010 about one month after a cholecystectomy. He developed a right lower extremity superficial thrombosis when he was off Coumadin just seven days back in July of 2012 in anticipation of hernia surgery. A hypercoagulation profile revealed none of the common abnormalities associated with clotting. However, in view of his clinical history, I advised long-term Coumadin anticoagulation. We have been managing his anticoagulation in our office.   He has chronic spine problems. He has a implanted TENS unit. He  developed progressive peripheral neuropathy. He has burning dysesthesias of his feet radiating up his legs. At times he can't feel his feet on the gas pedal. He has been tried on a number of different medications including Lyrica and Neurontin but doesn't tolerate these medications do to what he believes is significant swelling when he takes them. He has been evaluated by [facility] from neurology. One doctor told him he thought he had gout and put him on allopurinol which also cause swelling and he stopped it. His only joint symptom is in a right shoulder joint where he had previous rotator cuff surgery and he was subsequently told he has bony spurs.   Interim History:  He has not seen me in 3 years  although I continue to manage his Coumadin through the [facility] Coumadin clinic which is now being closed. For the last 2-1/2 years he has had a number of problems in multiple systems and is seen numerous physicians without getting a definite diagnosis until recently when he had his tens unit replaced and realized that one of the settings was much higher than it was supposed to be. When the unit was reprogrammed with the right settings, almost all of his chronic problems resolved even severe constipation.  He has had no interim bleeding problems on Coumadin. Current dose 7 mg daily except 10 mg on Sundays  and Thursdays. Most recent INR was 2.0 on 04/25/2015. He denies any dyspnea, chest pain, or palpitations. He still gets some swelling in his right calf when he is standing for a long time.  Medications: reviewed  Allergies:  Allergies  Allergen Reactions  . Aspirin Other (See Comments)    ulcers  . Celecoxib Other (See Comments)    Stomach irritation  . Ibuprofen Other (See Comments)    ulcers  . Lyrica [Pregabalin] Swelling    Review of Systems: See history of present illness  Remaining ROS negative:   Physical Exam: Blood pressure 141/80, pulse 92, temperature 98.2 F (36.8 C), temperature source Oral, height 6\' 4"  (1.93 m), weight 244 lb 6.4 oz (110.859 kg), SpO2 100 %. Wt Readings from Last 3 Encounters:  05/09/15 244 lb 6.4 oz (110.859 kg)  04/18/15 244 lb 2 oz (110.734 kg)  02/27/15 249 lb 12.8 oz (113.309 kg)     General appearance: Well-nourished  African-American man [name]: Pharynx no erythema, exudate, mass, or ulcer. No thyromegaly or thyroid nodules Lymph nodes: No cervical, supraclavicular, or axillary lymphadenopathy Breasts:  Lungs: Clear to auscultation, resonant to percussion throughout Heart: Regular rhythm, no murmur, no gallop, no rub, no click, no edema Abdomen: Soft, nontender, normal bowel sounds, no mass, no organomegaly Extremities: No  edema, no calf tenderness Right calf measurement 39.5 cm, 36 cm on the left Right ankle 24 cm, 23 cm on the left Musculoskeletal: no joint deformities GU:  Vascular: Carotid pulses 2+, no bruits,  Neurologic: Alert, oriented, PERRLA, , cranial nerves grossly normal, motor strength 5 over 5, reflexes 1+ symmetric, upper body coordination normal, gait normal, Skin: No rash or ecchymosis  Lab Results: CBC W/Diff    Component Value Date/Time   WBC 7.5 10/12/2014 1524   WBC 4.6 04/04/2014 1110   RBC 6.05* 10/12/2014 1524   RBC 5.76 04/04/2014 1110   HGB 13.9 10/12/2014 1524   HGB 13.1 04/04/2014 1110   HCT 42.5 10/12/2014 1524   HCT 41.7 04/04/2014 1110   PLT 265.0 10/12/2014 1524   PLT 210 04/04/2014 1110   MCV 70.3* 10/12/2014 1524   MCV 72.4* 04/04/2014 1110   MCH 23.6* 07/26/2014 1416   MCH 22.7* 04/04/2014 1110   MCHC 32.7 10/12/2014 1524   MCHC 31.3* 04/04/2014 1110   RDW 15.5 10/12/2014 1524   RDW 16.5* 04/04/2014 1110   LYMPHSABS 1.7 10/12/2014 1524   LYMPHSABS 1.5 04/04/2014 1110   MONOABS 0.5 10/12/2014 1524   MONOABS 0.5 04/04/2014 1110   EOSABS 0.1 10/12/2014 1524   EOSABS 0.0 04/04/2014 1110   BASOSABS 0.0 10/12/2014 1524   BASOSABS 0.1 04/04/2014 1110     Chemistry      Component Value Date/Time   NA 141 10/12/2014 1524   NA 143 04/04/2014 1116   K 3.9 10/12/2014 1524   K 3.7 04/04/2014 1116   CL 104 10/12/2014 1524   CO2 32 10/12/2014 1524   CO2 30* 04/04/2014 1116   BUN 14 10/12/2014 1524   BUN 10.1 04/04/2014 1116   CREATININE 1.09 10/12/2014 1524   CREATININE 1.0 04/04/2014 1116      Component Value Date/Time   CALCIUM 9.5 10/12/2014 1524   CALCIUM 9.3 04/04/2014 1116   ALKPHOS 56 10/12/2014 1524   ALKPHOS 45 04/04/2014 1116   AST 15 10/12/2014 1524   AST 14 04/04/2014 1116   ALT 17 10/12/2014 1524   ALT 10 04/04/2014 1116   BILITOT 0.4 10/12/2014 1524   BILITOT 0.48 04/04/2014 1116       Radiological Studies: No results  found.  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 Status post placement of a caval filter. He remains stable on current Coumadin dose. He has 1 more pro time scheduled at the [facility] Coumadin clinic and will then transition [facility] clinic. Since he has a caval filter in place, I don't think that one of the new oral Xa inhibitors will provide sufficient anticoagulation. Data I heard at a recent coagulation meeting suggests that mechanical devices including filters and heart valves initiate the clotting system through the contact pathway and this may be the reason why there have been unacceptable anticoagulation failures in patients with these devices and treated with the new anticoagulants.  CC: Patient Care Team: Elwyn Reach, MD as PCP - General (Internal Medicine)   [name], MD  8/30/20161:35 PM

## 2015-05-09 NOTE — Patient Instructions (Signed)
Continue coumadin at current dose  We will check coumadin levels here at [facility] clinic stating in [place] after your last check on September 6th at the cancer center Return visit with Dr Darnell Level in 1 year Please schedule new patient visit to [facility] coumadin clinic for October with point of care PT/INR

## 2015-05-16 ENCOUNTER — Ambulatory Visit: Payer: PPO

## 2015-05-16 ENCOUNTER — Other Ambulatory Visit: Payer: PPO

## 2015-05-18 ENCOUNTER — Telehealth: Payer: Self-pay | Admitting: Pharmacist

## 2015-05-18 NOTE — Telephone Encounter (Signed)
Patient called to schedule appt he missed on 9/6 due to "family tragedy." He will be scheduled tomorrow, 9/9, for his final visit to [facility].

## 2015-05-19 ENCOUNTER — Ambulatory Visit (HOSPITAL_BASED_OUTPATIENT_CLINIC_OR_DEPARTMENT_OTHER): Payer: PPO | Admitting: Pharmacist

## 2015-05-19 ENCOUNTER — Other Ambulatory Visit (HOSPITAL_BASED_OUTPATIENT_CLINIC_OR_DEPARTMENT_OTHER): Payer: PPO

## 2015-05-19 DIAGNOSIS — I2699 Other pulmonary embolism without acute cor pulmonale: Secondary | ICD-10-CM

## 2015-05-19 DIAGNOSIS — Z7901 Long term (current) use of anticoagulants: Secondary | ICD-10-CM

## 2015-05-19 LAB — POCT INR: INR: 1.8

## 2015-05-19 LAB — PROTIME-INR
INR: 1.8 — ABNORMAL LOW (ref 2.00–3.50)
Protime: 21.6 Seconds — ABNORMAL HIGH (ref 10.6–13.4)

## 2015-05-19 NOTE — Progress Notes (Signed)
LAST COUMADIN CLINIC APPOINTMENT  INR = 1.8   Goal 2-3 INR just below goal range. Mr. Bautch states he may have missed 1 or 2 doses this week. He also had to reschedule his appt from earlier this week. His nephew had a young male relative who died this week in a car accident leaving 4 children; he has been helping to care for her children. This has been stressful for him and he is not sure that he took his medication regularly. He took azithromycin 8/23 and 8/24 but did not complete a Z-Pak. He states that he stopped Z-Pak because he had a nose bleed. He has had no other medication changes. He had a visit with [name] at the [facility] and has an upcoming appt at the Coumadin clinic there on 06/12/15 at 11:30am. He is aware that today is his last visit at our Coumadin clinic. He will continue Coumadin 7 mg daily except for 10 mg on Thursday and Sunday.  [name], PharmD

## 2015-06-12 ENCOUNTER — Ambulatory Visit: Payer: PPO

## 2015-06-14 ENCOUNTER — Encounter: Payer: Self-pay | Admitting: Oncology

## 2015-06-22 ENCOUNTER — Telehealth: Payer: Self-pay | Admitting: *Deleted

## 2015-06-22 NOTE — Telephone Encounter (Signed)
Returned pt's call - stated he needs to check his PT. He did not keep his appt on the 3rd w/Dr Elie Confer; stated he forgot. Sent message to front office/Doris Solomon to re-schedule; pt awared.

## 2015-06-26 ENCOUNTER — Ambulatory Visit (INDEPENDENT_AMBULATORY_CARE_PROVIDER_SITE_OTHER): Payer: PPO | Admitting: Pharmacist

## 2015-06-26 DIAGNOSIS — Z7901 Long term (current) use of anticoagulants: Secondary | ICD-10-CM | POA: Diagnosis not present

## 2015-06-26 DIAGNOSIS — Z86718 Personal history of other venous thrombosis and embolism: Secondary | ICD-10-CM

## 2015-06-26 LAB — POCT INR: INR: 2.3

## 2015-06-26 NOTE — Patient Instructions (Signed)
Patient instructed to take medications as defined in the Anti-coagulation Track section of this encounter.  Patient instructed to take today's dose.  Patient verbalized understanding of these instructions.    

## 2015-06-26 NOTE — Progress Notes (Signed)
INTERNAL MEDICINE TEACHING ATTENDING ADDENDUM - Krishawn Vanderweele M.D  Duration- indefinite, Indication- PE, INR- therapeutic. Agree with pharmacy recommendations as outlined in their note.     

## 2015-06-26 NOTE — Progress Notes (Signed)
Anti-Coagulation Progress Note  [name] [name] is a 64 y.o. male who is currently on an anti-coagulation regimen.    RECENT RESULTS: Recent results are below, the most recent result is correlated with a dose of 57.5 mg. per week: Lab Results  Component Value Date   INR 2.3 06/26/2015   INR 1.80* 05/19/2015   INR 1.8 05/19/2015   PROTIME 21.6* 05/19/2015    ANTI-COAG DOSE: Anticoagulation Dose Instructions as of 06/26/2015      Sun Mon Tue Wed Thu Fri Sat   New Dose 10 mg 7.5 mg 7.5 mg 7.5 mg 10 mg 7.5 mg 7.5 mg       ANTICOAG SUMMARY: Anticoagulation Episode Summary    Current INR goal 2.0-3.0   Next INR check 07/24/2015   INR from last check 2.3 (06/26/2015)   Weekly max dose    Target end date    INR check location    Preferred lab    Send INR reminders to [facility]      Comments       Anticoagulation Care Providers    Provider Role Specialty Phone number   [name], MD Responsible Oncology [phone]      ANTICOAG TODAY: Anticoagulation Summary as of 06/26/2015    INR goal 2.0-3.0   Selected INR 2.3 (06/26/2015)   Next INR check 07/24/2015   Target end date     Anticoagulation Episode Summary    INR check location    Preferred lab    Send INR reminders to [facility] Providers    Provider Role Specialty Phone number   [name], MD Responsible Oncology [phone]      PATIENT INSTRUCTIONS: Patient Instructions  Patient instructed to take medications as defined in the Anti-coagulation Track section of this encounter.  Patient instructed to take today's dose.  Patient verbalized understanding of these instructions.       FOLLOW-UP Return in 4 weeks (on 07/24/2015) for Follow up INR at 1115h.  Jorene Guest, III Pharm.D., CACP

## 2015-07-18 ENCOUNTER — Other Ambulatory Visit: Payer: Self-pay | Admitting: *Deleted

## 2015-07-18 DIAGNOSIS — I2699 Other pulmonary embolism without acute cor pulmonale: Secondary | ICD-10-CM

## 2015-07-18 MED ORDER — WARFARIN SODIUM 5 MG PO TABS: ORAL_TABLET | ORAL | Status: DC

## 2015-07-24 ENCOUNTER — Ambulatory Visit: Payer: PPO

## 2015-07-31 ENCOUNTER — Telehealth: Payer: Self-pay | Admitting: *Deleted

## 2015-07-31 NOTE — Telephone Encounter (Signed)
Pt called past few days left side of chest is sore and has pink to redness color to skin. Denies rash or raised areas. Also has had a nonproductive cough and now has noted dark red material when coughing. Sees Dr Beryle Beams for Coumadin. Talked with Dr Lynnae January - needs to see PCP, ER or Urgent Care. Pt stated he understood. Hilda Blades Ilynn Stauffer RN 07/31/15 2:30PM

## 2015-08-04 ENCOUNTER — Encounter (HOSPITAL_COMMUNITY): Payer: Self-pay

## 2015-08-04 ENCOUNTER — Emergency Department (HOSPITAL_COMMUNITY): Payer: PPO

## 2015-08-04 ENCOUNTER — Emergency Department (HOSPITAL_COMMUNITY)
Admission: EM | Admit: 2015-08-04 | Discharge: 2015-08-04 | Disposition: A | Discharge status: Home / Self Care | Payer: PPO | Attending: Emergency Medicine | Admitting: Emergency Medicine

## 2015-08-04 DIAGNOSIS — F419 Anxiety disorder, unspecified: Secondary | ICD-10-CM | POA: Insufficient documentation

## 2015-08-04 DIAGNOSIS — Z79899 Other long term (current) drug therapy: Secondary | ICD-10-CM | POA: Insufficient documentation

## 2015-08-04 DIAGNOSIS — R079 Chest pain, unspecified: Secondary | ICD-10-CM | POA: Diagnosis not present

## 2015-08-04 DIAGNOSIS — Z7901 Long term (current) use of anticoagulants: Secondary | ICD-10-CM | POA: Diagnosis not present

## 2015-08-04 DIAGNOSIS — Z7951 Long term (current) use of inhaled steroids: Secondary | ICD-10-CM | POA: Diagnosis not present

## 2015-08-04 DIAGNOSIS — Z86718 Personal history of other venous thrombosis and embolism: Secondary | ICD-10-CM | POA: Diagnosis not present

## 2015-08-04 DIAGNOSIS — F329 Major depressive disorder, single episode, unspecified: Secondary | ICD-10-CM | POA: Diagnosis not present

## 2015-08-04 DIAGNOSIS — B029 Zoster without complications: Secondary | ICD-10-CM

## 2015-08-04 DIAGNOSIS — I10 Essential (primary) hypertension: Secondary | ICD-10-CM | POA: Insufficient documentation

## 2015-08-04 DIAGNOSIS — Z8739 Personal history of other diseases of the musculoskeletal system and connective tissue: Secondary | ICD-10-CM | POA: Insufficient documentation

## 2015-08-04 DIAGNOSIS — G629 Polyneuropathy, unspecified: Secondary | ICD-10-CM | POA: Insufficient documentation

## 2015-08-04 DIAGNOSIS — R238 Other skin changes: Secondary | ICD-10-CM | POA: Diagnosis present

## 2015-08-04 DIAGNOSIS — Z9889 Other specified postprocedural states: Secondary | ICD-10-CM | POA: Diagnosis not present

## 2015-08-04 DIAGNOSIS — F17210 Nicotine dependence, cigarettes, uncomplicated: Secondary | ICD-10-CM | POA: Diagnosis not present

## 2015-08-04 LAB — BASIC METABOLIC PANEL
Anion gap: 5 (ref 5–15)
BUN: 11 mg/dL (ref 6–20)
CO2: 32 mmol/L (ref 22–32)
Calcium: 9.2 mg/dL (ref 8.9–10.3)
Chloride: 104 mmol/L (ref 101–111)
Creatinine, Ser: 1 mg/dL (ref 0.61–1.24)
GFR calc Af Amer: >60 mL/min (ref >60)
GFR calc non Af Amer: >60 mL/min (ref >60)
Glucose, Bld: 93 mg/dL (ref 65–99)
Potassium: 3.9 mmol/L (ref 3.5–5.1)
Sodium: 141 mmol/L (ref 135–145)

## 2015-08-04 LAB — CBC
HCT: 39.5 % (ref 39.0–52.0)
Hemoglobin: 13 g/dL (ref 13.0–17.0)
MCH: 23.4 pg — ABNORMAL LOW (ref 26.0–34.0)
MCHC: 32.9 g/dL (ref 30.0–36.0)
MCV: 71.2 fL — ABNORMAL LOW (ref 78.0–100.0)
Platelets: 231 10*3/uL (ref 150–400)
RBC: 5.55 MIL/uL (ref 4.22–5.81)
RDW: 15.6 % — ABNORMAL HIGH (ref 11.5–15.5)
WBC: 5.4 10*3/uL (ref 4.0–10.5)

## 2015-08-04 LAB — I-STAT TROPONIN, ED: Troponin i, poc: 0.01 ng/mL (ref 0.00–0.08)

## 2015-08-04 MED ORDER — OXYCODONE-ACETAMINOPHEN 5-325 MG PO TABS
2.0000 | ORAL_TABLET | Freq: Once | ORAL | Status: AC
  Administered 2015-08-04: 2 via ORAL
  Filled 2015-08-04: qty 2

## 2015-08-04 MED ORDER — ACYCLOVIR 400 MG PO TABS: 800.0000 mg | ORAL_TABLET | Freq: Four times a day (QID) | ORAL | Status: DC

## 2015-08-04 NOTE — ED Provider Notes (Signed)
CSN: [id number]     Arrival date & time 08/04/15  1456 History   First MD Initiated Contact with Patient 08/04/15 2031     Chief Complaint  Patient presents with  . Chest Pain     (Consider location/radiation/quality/duration/timing/severity/associated sxs/prior Treatment) HPI   Patient is a 64 year old male with history of hypertension and DVT (on coumadin with IVC filter) who presents to the ED with complaint of chest pain. Patient reports having pain to the left side of his chest for the past week and a half but notes he noticed a small blisters to the area 2 days ago. Patient reports having worsening constant sharp burning sensation to the left side of his chest radiating laterally to his armpit region, pain is worse with movement, coughing or deep breathing. He reports when he was taking a hot shower today the blisters opened. Denies relief of pain with Percocet at home. He also reports he has had a productive cough for the past week. Denies fever, chills, headache, sore throat, SOB, wheezing, abdominal pain, nausea, vomiting, diaphoresis. Denies any known trauma or injury to his chest. Denies any prior history of similar chest pain. Denies any cardiac history.  Past Medical History  Diagnosis Date  . Hypertension   . History of blood clots   . Diverticulitis   . Hernia   . Diverticulosis   . Shingles   . Gout   . Back pain   . Neuropathy, peripheral ([place]) 05/15/2012  . DVT (deep venous thrombosis) ([place])   . Anxiety and depression   . Nausea and vomiting    Past Surgical History  Procedure Laterality Date  . Back surgery    . Hernia repair    . Hiatal hernia repair    . Appendectomy    . Cholecystectomy  2008  . Sacral nerve stimulator placement  2011  . Left heart catheterization with coronary angiogram N/A 08/02/2014    Procedure: LEFT HEART CATHETERIZATION WITH CORONARY ANGIOGRAM;  Surgeon: Laverda Page, MD;  Location: [facility] CATH LAB;  Service: Cardiovascular;   Laterality: N/A;   Family History  Problem Relation Age of Onset  . Prostate cancer Father   . Heart disease Father   . Diabetes Mother   . Heart disease Mother   . Colon cancer Maternal Uncle     dx in his 75's  . Pancreatic cancer Paternal Uncle   . Prostate cancer Paternal Uncle   . Multiple sclerosis Daughter   . Prostate cancer Paternal Uncle    Social History  Substance Use Topics  . Smoking status: Current Every Day Smoker -- 0.50 packs/day for 40 years    Types: Cigarettes  . Smokeless tobacco: Never Used     Comment: sometimes less.  . Alcohol Use: No    Review of Systems  Respiratory: Positive for cough.   Cardiovascular: Positive for chest pain.  Skin: Positive for rash (blisters).  All other systems reviewed and are negative.     Allergies  Aspirin; Celecoxib; Ibuprofen; and Lyrica  Home Medications   Prior to Admission medications   Medication Sig Start Date End Date Taking? Authorizing Provider  DULoxetine (CYMBALTA) 60 MG capsule Take 60 mg by mouth daily.    Yes Historical Provider, MD  fluticasone (FLONASE) 50 MCG/ACT nasal spray Place 2 sprays into both nostrils at bedtime.  04/13/14  Yes Historical Provider, MD  metoprolol tartrate (LOPRESSOR) 25 MG tablet Take 25 mg by mouth 2 (two) times daily. 07/29/14  Yes Historical  Provider, MD  NIFEDICAL XL 60 MG 24 hr tablet Take 120 mg by mouth daily.  02/21/14  Yes Historical Provider, MD  oxyCODONE-acetaminophen (PERCOCET) 10-325 MG tablet Take 1 tablet by mouth every 4 (four) hours as needed for pain.   Yes Historical Provider, MD  PROAIR HFA 108 (90 BASE) MCG/ACT inhaler Inhale 1 puff into the lungs every 6 (six) hours as needed for wheezing.  03/23/14  Yes Historical Provider, MD  promethazine (PHENERGAN) 25 MG tablet Take 1 tablet (25 mg total) by mouth every 6 (six) hours as needed for nausea or vomiting. 09/16/14  Yes [name], MD  tiZANidine (ZANAFLEX) 4 MG tablet Take 4 mg by mouth every 6 (six)  hours as needed for muscle spasms.  07/27/14  Yes Historical Provider, MD  valsartan-hydrochlorothiazide (DIOVAN-HCT) 320-25 MG per tablet Take 1 tablet by mouth daily.   Yes Historical Provider, MD  warfarin (COUMADIN) 5 MG tablet Take 10mg  (2 tablets) by mouth on Sundays and thursdays. Take 7.5mg  (1.5 tablets) rest of days 07/18/15  Yes Bartholomew Crews, MD  acyclovir (ZOVIRAX) 400 MG tablet Take 2 tablets (800 mg total) by mouth 4 (four) times daily. 08/04/15   Nona Dell, PA-C  glycopyrrolate (ROBINUL) 2 MG tablet Take 1 tablet (2 mg total) by mouth 2 (two) times daily. Patient not taking: Reported on 08/04/2015 04/10/15   [name], MD  omeprazole (PRILOSEC) 40 MG capsule Take 1 capsule twice daily before breakfast and dinner. Patient not taking: Reported on 08/04/2015 08/11/14   Amy S Esterwood, PA-C  ondansetron (ZOFRAN ODT) 8 MG disintegrating tablet Take 1 tablet (8 mg total) by mouth every 8 (eight) hours as needed for nausea or vomiting. 07/26/14   Tatyana Kirichenko, PA-C  ondansetron (ZOFRAN) 4 MG tablet Take 1 tablet (4 mg total) by mouth every 8 (eight) hours as needed for nausea or vomiting. Patient not taking: Reported on 08/04/2015 03/17/15   [name], MD  VOLTAREN 1 % GEL Apply 2 g topically daily as needed. For pain 02/23/14   Historical Provider, MD   BP 177/95 mmHg  Pulse 69  Temp(Src) 97.8 F (36.6 C) (Oral)  Resp 11  SpO2 100% Physical Exam  Constitutional: He is oriented to person, place, and time. He appears well-developed and well-nourished. No distress.  HENT:  Head: Normocephalic and atraumatic.  Mouth/Throat: Oropharynx is clear and moist. No oropharyngeal exudate.  Eyes: Conjunctivae and EOM are normal. Pupils are equal, round, and reactive to light. Right eye exhibits no discharge. Left eye exhibits no discharge. No scleral icterus.  Neck: Normal range of motion. Neck supple.  Cardiovascular: Normal rate, regular rhythm, normal heart  sounds and intact distal pulses.   Pulmonary/Chest: Effort normal and breath sounds normal. No respiratory distress. He has no wheezes. He has no rales. Tenderness: left chest wall tender with light palpation.  Abdominal: Soft. Bowel sounds are normal. He exhibits no distension and no mass. There is no tenderness. There is no rebound and no guarding.  Musculoskeletal: He exhibits no edema.  Lymphadenopathy:    He has no cervical adenopathy.  Neurological: He is alert and oriented to person, place, and time.  Skin: Skin is warm and dry. He is not diaphoretic.  Multiple small healing lesions noted to left chest wall along T4/T5 dermatome with mild surrounding erythema, tender with light palpation.  Nursing note and vitals reviewed.   ED Course  Procedures (including critical care time) Labs Review Labs Reviewed  CBC -  Abnormal; Notable for the following:    MCV 71.2 (*)    MCH 23.4 (*)    RDW 15.6 (*)    All other components within normal limits  BASIC METABOLIC PANEL  I-STAT TROPOININ, ED    Imaging Review Dg Chest 2 View  08/04/2015  CLINICAL DATA:  Left side chest pain EXAM: CHEST  2 VIEW COMPARISON:  07/26/2014 FINDINGS: Cardiomediastinal silhouette is stable. No acute infiltrate or pleural effusion. No pulmonary edema. Degenerative changes thoracic spine. Spinal stimulator wires are again noted. IMPRESSION: No active cardiopulmonary disease. Degenerative changes thoracic spine. Electronically Signed   By: Lahoma Crocker M.D.   On: 08/04/2015 16:07   I have personally reviewed and evaluated these images and lab results as part of my medical decision-making.   EKG Interpretation   Date/Time:  Friday August 04 2015 15:20:16 EST Ventricular Rate:  83 PR Interval:  144 QRS Duration: 100 QT Interval:  376 QTC Calculation: 442 R Axis:   -76 Text Interpretation:  Sinus rhythm LAD, consider left anterior fascicular  block No significant change since last tracing Confirmed by LIU  MD, DANA  [id number]) on 08/04/2015 10:04:57 PM      MDM   Final diagnoses:  Shingles    Patient presents with chest pain for the past week and a half. He reports noticing blisters to his left chest wall 2 days ago. Left chest wall pain is worse with light touch, coughing or deep breathing. Denies fever, SOB. VSS. Exam revealed small lesions with surrounding erythema noted on left chest wall along T4/T5 dermatome. Lesions are consistent with shingles. EKG showed normal sinus rhythm, unchanged. Troponin negative. Labs unremarkable. Chest x-ray showed no active cardiopulmonary disease. Discussed results and plan for discharge with patient. Patient given prescription for acyclovir. Advised patient to follow up with his primary care provider.  Evaluation does not show pathology requring ongoing emergent intervention or admission. Pt is hemodynamically stable and mentating appropriately. Discussed findings/results and plan with patient/guardian, who agrees with plan. All questions answered. Return precautions discussed and outpatient follow up given.      Chesley Noon [place], [place] 08/04/15 [place] Liu, MD 08/05/15 [phone]

## 2015-08-04 NOTE — Discharge Instructions (Signed)
Take 2 tablets of Acyclovir 4 times daily for 5 days. You may continue taking your home prescription of Percocet as prescribed as needed for pain relief. You may also apply a cool compress to affected areas for pain relief. Shingles can be spread to people who have not had Chicken Pox of have not received the varicella vaccine, it is spread by person to person contact by direct contact of skin lesions. Keep your rash covered and wash your hands frequently.  Follow up with your primary care provider in 3 days. Please return to the Emergency Department if symptoms worsen or new onset of fever, rash spreading to your eyes.

## 2015-08-04 NOTE — ED Notes (Signed)
Per pt, chest pain under mediastinum since Wednesday.  Hurts worse when moving arm. No trauma noted.  No shortness of breath.  No cardiac history.  Some cough.  Has been coughing.

## 2015-08-21 ENCOUNTER — Ambulatory Visit (INDEPENDENT_AMBULATORY_CARE_PROVIDER_SITE_OTHER): Payer: PPO | Admitting: Pharmacist

## 2015-08-21 DIAGNOSIS — Z7901 Long term (current) use of anticoagulants: Secondary | ICD-10-CM

## 2015-08-21 LAB — POCT INR: INR: 3

## 2015-08-21 NOTE — Progress Notes (Signed)
Reviewed.    Thanks!

## 2015-08-21 NOTE — Progress Notes (Signed)
Anti-Coagulation Progress Note  [name] [name] is a 64 y.o. male who is currently on an anti-coagulation regimen.    RECENT RESULTS: Recent results are below, the most recent result is correlated with a dose of 57.5 mg. per week: Lab Results  Component Value Date   INR 3.0 08/21/2015   INR 2.3 06/26/2015   INR 1.80* 05/19/2015   PROTIME 21.6* 05/19/2015    ANTI-COAG DOSE: Anticoagulation Dose Instructions as of 08/21/2015      Dorene Grebe Tue Wed Thu Fri Sat   New Dose 7.5 mg 7.5 mg 7.5 mg 10 mg 7.5 mg 7.5 mg 7.5 mg       ANTICOAG SUMMARY: Anticoagulation Episode Summary    Current INR goal 2.0-3.0   Next INR check 10/02/2015   INR from last check 3.0 (08/21/2015)   Weekly max dose    Target end date    INR check location    Preferred lab    Send INR reminders to [place]      Comments       Anticoagulation Care Providers    Provider Role Specialty Phone number   [name], MD Responsible Oncology [phone]      ANTICOAG TODAY: Anticoagulation Summary as of 08/21/2015    INR goal 2.0-3.0   Selected INR 3.0 (08/21/2015)   Next INR check 10/02/2015   Target end date     Anticoagulation Episode Summary    INR check location    Preferred lab    Send INR reminders to [place] Providers    Provider Role Specialty Phone number   [name], MD Responsible Oncology [phone]      PATIENT INSTRUCTIONS: Patient Instructions  Patient instructed to take medications as defined in the Anti-coagulation Track section of this encounter.  Patient instructed to take today's dose.  Patient verbalized understanding of these instructions.       FOLLOW-UP Return in 6 weeks (on 10/02/2015) for Follow up INR at 1145h.  Jorene Guest, III Pharm.D., CACP

## 2015-08-21 NOTE — Patient Instructions (Signed)
Patient instructed to take medications as defined in the Anti-coagulation Track section of this encounter.  Patient instructed to take today's dose.  Patient verbalized understanding of these instructions.    

## 2015-09-13 ENCOUNTER — Ambulatory Visit: Payer: Medicare Other | Attending: Physical Medicine and Rehabilitation | Admitting: Physical Therapy

## 2015-09-13 DIAGNOSIS — M25512 Pain in left shoulder: Secondary | ICD-10-CM | POA: Insufficient documentation

## 2015-09-13 DIAGNOSIS — Z7409 Other reduced mobility: Secondary | ICD-10-CM | POA: Insufficient documentation

## 2015-09-13 DIAGNOSIS — Z789 Other specified health status: Secondary | ICD-10-CM

## 2015-09-13 DIAGNOSIS — R29898 Other symptoms and signs involving the musculoskeletal system: Secondary | ICD-10-CM | POA: Insufficient documentation

## 2015-09-13 NOTE — Patient Instructions (Signed)
AROM: Lateral Neck Flexion    Slowly tilt head toward one shoulder, then the other. Hold each position ____ seconds. Repeat ____ times per set. Do ____ sets per session. Do ____ sessions per day.  http://orth.exer.us/296   Copyright  VHI. All rights reserved.  AROM, Rotation    Sit or stand, head comfortable, centered position. Turn head slowly to look over one shoulder. Hold ___ seconds. Repeat to other side. Repeat ___ times per session. Do ___ sessions per day.  Copyright  VHI. All rights reserved.  Flexion: Cervical OA With AA Rotation Lock    Rotate head to right as far as possible without pain. Look down, tuck chin, look at armpit and hold __20__ seconds. Repeat ___3_ times per set.  Do __2__ sessions per week.  Copyright  VHI. All rights reserved.  [place] [address] [place], [place] [address] Phone # [phone] Fax [phone]

## 2015-09-13 NOTE — Therapy (Addendum)
[facility] [address] [place], [place], [address] Phone: [phone]   Fax:  [phone]  Physical Therapy Evaluation  Patient Details  Name: [name] [name] MRN: [id number] Date of Birth: 01/23/1951 Referring Provider: Greta Doom  Encounter Date: 09/13/2015      PT End of Session - 09/13/15 1105    Visit Number 1   Date for PT Re-Evaluation 11/08/15   PT Start Time 5638   PT Stop Time 1106   PT Time Calculation (min) 51 min   Activity Tolerance Patient limited by pain   Behavior During Therapy [facility] for tasks assessed/performed      Past Medical History  Diagnosis Date  . Hypertension   . History of blood clots   . Diverticulitis   . Hernia   . Diverticulosis   . Shingles   . Gout   . Back pain   . Neuropathy, peripheral ([place]) 05/15/2012  . DVT (deep venous thrombosis) ([place])   . Anxiety and depression   . Nausea and vomiting     Past Surgical History  Procedure Laterality Date  . Back surgery    . Hernia repair    . Hiatal hernia repair    . Appendectomy    . Cholecystectomy  2008  . Sacral nerve stimulator placement  2011  . Left heart catheterization with coronary angiogram N/A 08/02/2014    Procedure: LEFT HEART CATHETERIZATION WITH CORONARY ANGIOGRAM;  Surgeon: Laverda Page, MD;  Location: [facility] CATH LAB;  Service: Cardiovascular;  Laterality: N/A;    There were no vitals filed for this visit.  Visit Diagnosis:  Pain in joint of left shoulder - Plan: PT plan of care cert/re-cert  Weakness of left upper extremity - Plan: PT plan of care cert/re-cert  Impaired mobility and ADLs - Plan: PT plan of care cert/re-cert      Subjective Assessment - 09/13/15 1055    Pertinent History gout Lt shoulder 1 month ago, shingles Lt chest, UE 1 month ago            [facility] PT Assessment - 09/13/15 0001    Assessment   Medical Diagnosis rotator cuff syndrome   Referring Provider bodea   Hand  Dominance Left   Precautions   Precautions --   Precaution Comments shingles Lt chest and UE 1 month ago, no open sores   Restrictions   Weight Bearing Restrictions No   Balance Screen   Has the patient fallen in the past 6 months No   Prior Function   Level of Independence Independent   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   Cognition   Overall Cognitive Status Within Functional Limits for tasks assessed   Observation/Other Assessments   Focus on Therapeutic Outcomes (FOTO)  63% limited   Sensation   Additional Comments pt hypersensitive to palpation Lt pec, all areas of shoulder, clavicle, scapula mm on Lt   Coordination   Gross Motor Movements are Fluid and Coordinated Yes   ROM / Strength   AROM / PROM / Strength AROM;Strength   AROM   AROM Assessment Site Shoulder   Right/Left Shoulder Left   Left Shoulder Flexion 140 Degrees   Left Shoulder ABduction 94 Degrees   Left Shoulder Internal Rotation --  unable to test due to pain   Left Shoulder External Rotation --  [facility]   Strength   Overall Strength Comments Left shoulder 3-/5 all movements, unable to tolerate any resistance due  to pain, Lt bicep 3/5, tricep 3-/5 limited by pain   Special Tests    Special Tests Rotator Cuff Impingement   Rotator Cuff Impingment tests Empty Can test   Empty Can test   Findings Positive   Side Left   Comment negative on Rt                   [facility] Adult PT Treatment/Exercise - Sep 15, 2015 0001    Modalities   Modalities Electrical Stimulation   Electrical Stimulation   Electrical Stimulation Location Lt shoulder  pt only able to tolerate 4 minutes due to "throbbing" in LUE   Electrical Stimulation Action --  IFC   Electrical Stimulation Goals Pain                PT Education - September 15, 2015 1105    Education provided Yes   Education Details HEP, PT POC   Person(s) Educated Patient   Methods Explanation;Demonstration;Handout   Comprehension  Verbalized understanding;Returned demonstration          PT Short Term Goals - 09/15/15 1109    PT SHORT TERM GOAL #1   Title Pt independent with initial HEP   Time 4   Period Weeks   PT SHORT TERM GOAL #2   Title Pt will be able to tolerate 45 minute PT session with pain <=6/10   Time 4   Period Weeks           PT Long Term Goals - 09/15/2015 1114    Additional Long Term Goals   Additional Long Term Goals Yes   PT LONG TERM GOAL #6   Title pt will improve FOTO to <= 50% to demo improved function   Time 8   Period Weeks               Plan - 15-Sep-2015 1106    Clinical Impression Statement Pt presents with increased pain, decreased strength, ROM and functional ability of L UE. Pt unable to tolerate pendelums, isometric shoulder or shoulder blade activites. Pt with difficulty tolerating PROM and shoulder jt distraction. Pt will benefit from skilled PT to address deficits and decrease pain and improve functional mobility   Pt will benefit from skilled therapeutic intervention in order to improve on the following deficits Decreased strength;Pain;Decreased activity tolerance;Impaired UE functional use   Rehab Potential Good   PT Frequency 2x / week   PT Duration 8 weeks   PT Treatment/Interventions ADLs/Self Care Home Management;Functional mobility training;Therapeutic activities;Therapeutic exercise;Cryotherapy;Electrical Stimulation;Iontophoresis '4mg'$ /ml Dexamethasone;Moist Heat;Ultrasound;Taping;Patient/family education;Dry needling;Passive range of motion;Vasopneumatic Device   PT Next Visit Plan assess HEP, modalities?, attempt ROM and strengthening   PT Home Exercise Plan cervical AROM   Consulted and Agree with Plan of Care Patient          G-Codes - Sep 15, 2015 1115    Functional Assessment Tool Used FOTO   Functional Limitation Carrying, moving and handling objects   Carrying, Moving and Handling Objects Current Status [phone]) At least 60 percent but less than 80  percent impaired, limited or restricted   Carrying, Moving and Handling Objects Goal Status (U5427) At least 40 percent but less than 60 percent impaired, limited or restricted       Problem List Patient Active Problem List   Diagnosis Date Noted  . Angina pectoris ([place]) 08/01/2014  . Hemorrhage of rectum and anus 03/28/2014  . Neuropathy, peripheral ([place]) 05/15/2012  . Pulmonary embolism ([place]) 07/11/2011  . DVT (deep venous thrombosis) ([place]) 07/11/2011  .  Abdominal pain, left lower quadrant 01/29/2011  . Long term (current) use of anticoagulants 01/29/2011  . Diarrhea 01/29/2011    Isabelle Course, PT, DPT  09/13/2015, 11:18 AM PHYSICAL THERAPY DISCHARGE SUMMARY  Visits from Start of Care: 1  Current functional level related to goals / functional outcomes: Pt attended PT for 1 PT session and then cancelled all PT sessions.  Pt didn't return to PT.     Remaining deficits: See evaluation.  Pt didn't return to PT.     Education / Equipment: HEP  Plan: Patient agrees to discharge.  Patient goals were not met. Patient is being discharged due to the patient's request.  ?????   Sigurd Sos, PT 10/17/2015 12:36 PM  [facility] [address] [place], [place], [address] Phone: [phone]   Fax:  [phone]  Name: [name] [name] MRN: [id number] Date of Birth: 1950/10/08

## 2015-09-18 ENCOUNTER — Encounter: Payer: PPO | Admitting: Physical Therapy

## 2015-09-25 ENCOUNTER — Ambulatory Visit: Payer: Medicare Other | Admitting: Physical Therapy

## 2015-09-27 ENCOUNTER — Encounter: Payer: PPO | Admitting: Physical Therapy

## 2015-10-02 ENCOUNTER — Encounter: Payer: PPO | Admitting: Physical Therapy

## 2015-10-02 ENCOUNTER — Ambulatory Visit: Payer: PPO

## 2015-10-09 ENCOUNTER — Ambulatory Visit (INDEPENDENT_AMBULATORY_CARE_PROVIDER_SITE_OTHER): Payer: Medicare Other | Admitting: Pharmacist

## 2015-10-09 DIAGNOSIS — Z86711 Personal history of pulmonary embolism: Secondary | ICD-10-CM

## 2015-10-09 DIAGNOSIS — Z7901 Long term (current) use of anticoagulants: Secondary | ICD-10-CM

## 2015-10-09 LAB — POCT INR: INR: 1.6

## 2015-10-09 NOTE — Progress Notes (Signed)
Anti-Coagulation Progress Note  AVISHAI KAPLER is a 65 y.o. male who is currently on an anti-coagulation regimen.    RECENT RESULTS: Recent results are below, the most recent result is correlated with a dose of 55 mg. per week: Lab Results  Component Value Date   INR 1.60 10/09/2015   INR 3.0 08/21/2015   INR 2.3 06/26/2015   PROTIME 21.6* 05/19/2015    ANTI-COAG DOSE: Anticoagulation Dose Instructions as of 10/09/2015      Sun Mon Tue Wed Thu Fri Sat   New Dose 7.5 mg 10 mg 7.5 mg 10 mg 7.5 mg 10 mg 7.5 mg       ANTICOAG SUMMARY: Anticoagulation Episode Summary    Current INR goal 2.0-3.0   Next INR check 10/30/2015   INR from last check 1.60! (10/09/2015)   Weekly max dose    Target end date    INR check location    Preferred lab    Send INR reminders to [place]      Comments       Anticoagulation Care Providers    Provider Role Specialty Phone number   [name], MD Responsible Oncology [phone]      ANTICOAG TODAY: Anticoagulation Summary as of 10/09/2015    INR goal 2.0-3.0   Selected INR 1.60! (10/09/2015)   Next INR check 10/30/2015   Target end date     Anticoagulation Episode Summary    INR check location    Preferred lab    Send INR reminders to [place] Providers    Provider Role Specialty Phone number   [name], MD Responsible Oncology [phone]      PATIENT INSTRUCTIONS: Patient Instructions  Patient instructed to take medications as defined in the Anti-coagulation Track section of this encounter.  Patient instructed to take today's dose.  Patient verbalized understanding of these instructions.       FOLLOW-UP Return in 3 weeks (on 10/30/2015) for Follow up INR at 1130h.  Jorene Guest, III Pharm.D., CACP

## 2015-10-09 NOTE — Progress Notes (Signed)
Indication: Recurrent venous thromboembolism Duration: Lifelong INR: Below target.  Dr. Gladstone Pih assessment and plan were reviewed and I agree with his documentation.

## 2015-10-09 NOTE — Patient Instructions (Signed)
Patient instructed to take medications as defined in the Anti-coagulation Track section of this encounter.  Patient instructed to take today's dose.  Patient verbalized understanding of these instructions.    

## 2015-10-16 ENCOUNTER — Encounter (HOSPITAL_COMMUNITY): Payer: Self-pay | Admitting: Emergency Medicine

## 2015-10-16 ENCOUNTER — Inpatient Hospital Stay (HOSPITAL_COMMUNITY)
Admission: EM | Admit: 2015-10-16 | Discharge: 2015-10-20 | DRG: 131 | Disposition: A | Discharge status: Home / Self Care | Payer: Medicare Other | Attending: Internal Medicine | Admitting: Internal Medicine

## 2015-10-16 ENCOUNTER — Encounter (HOSPITAL_COMMUNITY): Payer: Self-pay | Admitting: *Deleted

## 2015-10-16 ENCOUNTER — Telehealth: Payer: Self-pay | Admitting: Pharmacist

## 2015-10-16 ENCOUNTER — Emergency Department (HOSPITAL_COMMUNITY)
Admission: EM | Admit: 2015-10-16 | Discharge: 2015-10-16 | Disposition: A | Discharge status: Nursing Home (Includes ICF) | Payer: Medicare Other | Source: Home / Self Care | Attending: Emergency Medicine | Admitting: Emergency Medicine

## 2015-10-16 DIAGNOSIS — Z7901 Long term (current) use of anticoagulants: Secondary | ICD-10-CM

## 2015-10-16 DIAGNOSIS — G629 Polyneuropathy, unspecified: Secondary | ICD-10-CM | POA: Diagnosis present

## 2015-10-16 DIAGNOSIS — K045 Chronic apical periodontitis: Secondary | ICD-10-CM | POA: Diagnosis present

## 2015-10-16 DIAGNOSIS — K047 Periapical abscess without sinus: Secondary | ICD-10-CM | POA: Diagnosis not present

## 2015-10-16 DIAGNOSIS — L03211 Cellulitis of face: Secondary | ICD-10-CM | POA: Diagnosis present

## 2015-10-16 DIAGNOSIS — Z833 Family history of diabetes mellitus: Secondary | ICD-10-CM

## 2015-10-16 DIAGNOSIS — K0889 Other specified disorders of teeth and supporting structures: Secondary | ICD-10-CM | POA: Diagnosis not present

## 2015-10-16 DIAGNOSIS — K029 Dental caries, unspecified: Secondary | ICD-10-CM | POA: Diagnosis present

## 2015-10-16 DIAGNOSIS — Z86711 Personal history of pulmonary embolism: Secondary | ICD-10-CM

## 2015-10-16 DIAGNOSIS — K219 Gastro-esophageal reflux disease without esophagitis: Secondary | ICD-10-CM | POA: Diagnosis present

## 2015-10-16 DIAGNOSIS — Z8249 Family history of ischemic heart disease and other diseases of the circulatory system: Secondary | ICD-10-CM

## 2015-10-16 DIAGNOSIS — B998 Other infectious disease: Secondary | ICD-10-CM | POA: Diagnosis not present

## 2015-10-16 DIAGNOSIS — Z885 Allergy status to narcotic agent status: Secondary | ICD-10-CM

## 2015-10-16 DIAGNOSIS — Z888 Allergy status to other drugs, medicaments and biological substances status: Secondary | ICD-10-CM

## 2015-10-16 DIAGNOSIS — Z886 Allergy status to analgesic agent status: Secondary | ICD-10-CM

## 2015-10-16 DIAGNOSIS — Z79899 Other long term (current) drug therapy: Secondary | ICD-10-CM

## 2015-10-16 DIAGNOSIS — F17210 Nicotine dependence, cigarettes, uncomplicated: Secondary | ICD-10-CM | POA: Diagnosis present

## 2015-10-16 DIAGNOSIS — L089 Local infection of the skin and subcutaneous tissue, unspecified: Secondary | ICD-10-CM

## 2015-10-16 DIAGNOSIS — M109 Gout, unspecified: Secondary | ICD-10-CM | POA: Diagnosis present

## 2015-10-16 DIAGNOSIS — Z86718 Personal history of other venous thrombosis and embolism: Secondary | ICD-10-CM

## 2015-10-16 DIAGNOSIS — F329 Major depressive disorder, single episode, unspecified: Secondary | ICD-10-CM | POA: Diagnosis present

## 2015-10-16 DIAGNOSIS — F419 Anxiety disorder, unspecified: Secondary | ICD-10-CM | POA: Diagnosis present

## 2015-10-16 DIAGNOSIS — I10 Essential (primary) hypertension: Secondary | ICD-10-CM | POA: Diagnosis present

## 2015-10-16 LAB — COMPREHENSIVE METABOLIC PANEL
ALT: 14 U/L — ABNORMAL LOW (ref 17–63)
AST: 19 U/L (ref 15–41)
Albumin: 3.6 g/dL (ref 3.5–5.0)
Alkaline Phosphatase: 42 U/L (ref 38–126)
Anion gap: 12 (ref 5–15)
BUN: 6 mg/dL (ref 6–20)
CO2: 28 mmol/L (ref 22–32)
Calcium: 9.1 mg/dL (ref 8.9–10.3)
Chloride: 95 mmol/L — ABNORMAL LOW (ref 101–111)
Creatinine, Ser: 0.93 mg/dL (ref 0.61–1.24)
GFR calc Af Amer: >60 mL/min (ref >60)
GFR calc non Af Amer: >60 mL/min (ref >60)
Glucose, Bld: 90 mg/dL (ref 65–99)
Potassium: 4 mmol/L (ref 3.5–5.1)
Sodium: 135 mmol/L (ref 135–145)
Total Bilirubin: 0.6 mg/dL (ref 0.3–1.2)
Total Protein: 6.8 g/dL (ref 6.5–8.1)

## 2015-10-16 LAB — CBC WITH DIFFERENTIAL/PLATELET
Basophils Absolute: 0 10*3/uL (ref 0.0–0.1)
Basophils Relative: 0 %
Eosinophils Absolute: 0 10*3/uL (ref 0.0–0.7)
Eosinophils Relative: 0 %
HCT: 37.6 % — ABNORMAL LOW (ref 39.0–52.0)
Hemoglobin: 12.3 g/dL — ABNORMAL LOW (ref 13.0–17.0)
Lymphocytes Relative: 24 %
Lymphs Abs: 1.4 10*3/uL (ref 0.7–4.0)
MCH: 22.9 pg — ABNORMAL LOW (ref 26.0–34.0)
MCHC: 32.7 g/dL (ref 30.0–36.0)
MCV: 70.1 fL — ABNORMAL LOW (ref 78.0–100.0)
Monocytes Absolute: 0.9 10*3/uL (ref 0.1–1.0)
Monocytes Relative: 15 %
Neutro Abs: 3.7 10*3/uL (ref 1.7–7.7)
Neutrophils Relative %: 61 %
Platelets: 215 10*3/uL (ref 150–400)
RBC: 5.36 MIL/uL (ref 4.22–5.81)
RDW: 15.7 % — ABNORMAL HIGH (ref 11.5–15.5)
WBC: 6 10*3/uL (ref 4.0–10.5)

## 2015-10-16 LAB — I-STAT CG4 LACTIC ACID, ED: Lactic Acid, Venous: 1.11 mmol/L (ref 0.5–2.0)

## 2015-10-16 MED ORDER — MORPHINE SULFATE (PF) 4 MG/ML IV SOLN
4.0000 mg | Freq: Once | INTRAVENOUS | Status: AC
  Administered 2015-10-16: 4 mg via INTRAVENOUS
  Filled 2015-10-16: qty 1

## 2015-10-16 MED ORDER — OXYCODONE-ACETAMINOPHEN 5-325 MG PO TABS
1.0000 | ORAL_TABLET | Freq: Once | ORAL | Status: AC
  Administered 2015-10-16: 1 via ORAL

## 2015-10-16 MED ORDER — CLINDAMYCIN PHOSPHATE 600 MG/50ML IV SOLN
600.0000 mg | Freq: Once | INTRAVENOUS | Status: AC
  Administered 2015-10-17: 600 mg via INTRAVENOUS
  Filled 2015-10-16: qty 50

## 2015-10-16 MED ORDER — FENTANYL CITRATE (PF) 100 MCG/2ML IJ SOLN: 50.0000 ug | Freq: Once | INTRAMUSCULAR | Status: DC

## 2015-10-16 MED ORDER — ONDANSETRON HCL 4 MG/2ML IJ SOLN
4.0000 mg | Freq: Once | INTRAMUSCULAR | Status: AC
  Administered 2015-10-16: 4 mg via INTRAVENOUS
  Filled 2015-10-16: qty 2

## 2015-10-16 MED ORDER — OXYCODONE-ACETAMINOPHEN 5-325 MG PO TABS
ORAL_TABLET | ORAL | Status: AC
  Filled 2015-10-16: qty 1

## 2015-10-16 MED ORDER — SODIUM CHLORIDE 0.9 % IV BOLUS (SEPSIS)
1000.0000 mL | Freq: Once | INTRAVENOUS | Status: AC
  Administered 2015-10-16: 1000 mL via INTRAVENOUS

## 2015-10-16 MED ORDER — HYDROCODONE-ACETAMINOPHEN 5-325 MG PO TABS
2.0000 | ORAL_TABLET | Freq: Once | ORAL | Status: AC
  Administered 2015-10-16: 2 via ORAL

## 2015-10-16 MED ORDER — HYDROCODONE-ACETAMINOPHEN 5-325 MG PO TABS
ORAL_TABLET | ORAL | Status: AC
  Filled 2015-10-16: qty 2

## 2015-10-16 NOTE — ED Notes (Signed)
Patient reports left upper tooth, left cheek pain and swelling that started on Friday night 2/3.  Patient has been rinsing with warm salt water.

## 2015-10-16 NOTE — Telephone Encounter (Signed)
Patient calls worried he may have a dental abcess left side of face since Friday last week. Called his PCP and can't get a return call. PCP is Dr. Jonelle Sidle. We do not provide his PC. Advised to re-call PCP or go to [facility]. See additional notes: Patient was advised to call me if when seen by PCP or UC--if any provider starts him on an antibiotic--which may see potential rise in INR. Patient verbalized understanding of this.

## 2015-10-16 NOTE — ED Provider Notes (Signed)
CSN: [id number]     Arrival date & time 10/16/15  1541 History   First MD Initiated Contact with Patient 10/16/15 2218     Chief Complaint  Patient presents with  . Dental Pain     (Consider location/radiation/quality/duration/timing/severity/associated sxs/prior Treatment) HPI   Patient possess the emergency department with complaints of dental pain and swelling. He was seen at the urgent care and sent here for further evaluation. He reports the symptoms started 3 days ago on the left side of his mouth. He says that today the swelling got significantly worse with swelling to his eye he feels like his nose is being pushed up somewhat and he has swelling down the side of his neck. He's not had any difficulty breathing, choking or stridor. He states that he feels like he has an abscess there. He has not tried any treatment for this prior to arrival.  Past Medical History  Diagnosis Date  . Hypertension   . History of blood clots   . Diverticulitis   . Hernia   . Diverticulosis   . Shingles   . Gout   . Back pain   . Neuropathy, peripheral ([place]) 05/15/2012  . DVT (deep venous thrombosis) ([place])   . Anxiety and depression   . Nausea and vomiting    Past Surgical History  Procedure Laterality Date  . Back surgery    . Hernia repair    . Hiatal hernia repair    . Appendectomy    . Cholecystectomy  2008  . Sacral nerve stimulator placement  2011  . Left heart catheterization with coronary angiogram N/A 08/02/2014    Procedure: LEFT HEART CATHETERIZATION WITH CORONARY ANGIOGRAM;  Surgeon: Laverda Page, MD;  Location: [facility] CATH LAB;  Service: Cardiovascular;  Laterality: N/A;   Family History  Problem Relation Age of Onset  . Prostate cancer Father   . Heart disease Father   . Diabetes Mother   . Heart disease Mother   . Colon cancer Maternal Uncle     dx in his 53's  . Pancreatic cancer Paternal Uncle   . Prostate cancer Paternal Uncle   . Multiple sclerosis Daughter   .  Prostate cancer Paternal Uncle    Social History  Substance Use Topics  . Smoking status: Current Every Day Smoker -- 0.50 packs/day for 40 years    Types: Cigarettes  . Smokeless tobacco: Never Used     Comment: sometimes less.  . Alcohol Use: No    Review of Systems  Review of Systems All other systems negative except as documented in the HPI. All pertinent positives and negatives as reviewed in the HPI.   Allergies  Aspirin; Celecoxib; Ibuprofen; Lyrica; and Vicodin  Home Medications   Prior to Admission medications   Medication Sig Start Date End Date Taking? Authorizing Provider  DULoxetine (CYMBALTA) 60 MG capsule Take 120 mg by mouth daily.    Yes Historical Provider, MD  glycopyrrolate (ROBINUL) 2 MG tablet Take 1 tablet (2 mg total) by mouth 2 (two) times daily. 04/10/15  Yes [name], MD  metoprolol tartrate (LOPRESSOR) 25 MG tablet Take 25 mg by mouth 2 (two) times daily. 07/29/14  Yes Historical Provider, MD  NIFEDICAL XL 60 MG 24 hr tablet Take 120 mg by mouth daily.  02/21/14  Yes Historical Provider, MD  ondansetron (ZOFRAN ODT) 8 MG disintegrating tablet Take 1 tablet (8 mg total) by mouth every 8 (eight) hours as needed for nausea or vomiting. 07/26/14  Yes Tatyana Kirichenko, PA-C  oxyCODONE-acetaminophen (PERCOCET) 10-325 MG tablet Take 1 tablet by mouth every 4 (four) hours as needed for pain.   Yes Historical Provider, MD  PROAIR HFA 108 (90 BASE) MCG/ACT inhaler Inhale 1 puff into the lungs every 6 (six) hours as needed for wheezing.  03/23/14  Yes Historical Provider, MD  tiZANidine (ZANAFLEX) 4 MG tablet Take 4 mg by mouth every 6 (six) hours as needed for muscle spasms.  07/27/14  Yes Historical Provider, MD  valsartan-hydrochlorothiazide (DIOVAN-HCT) 320-25 MG per tablet Take 1 tablet by mouth daily.   Yes Historical Provider, MD  VOLTAREN 1 % GEL Apply 2 g topically daily as needed. For neck and shoulder pain 02/23/14  Yes Historical Provider, MD   warfarin (COUMADIN) 5 MG tablet Take 7.5-10 mg by mouth daily. Take two of the 5mg  tablets (total of 10mg ) on mon and tuesdays fridays, take 7.5mg (Split 5mg  tab in half ) on wed and saturdays and Sunday per patient   Yes Historical Provider, MD  acyclovir (ZOVIRAX) 400 MG tablet Take 2 tablets (800 mg total) by mouth 4 (four) times daily. Patient not taking: Reported on 10/16/2015 08/04/15   Nona Dell, PA-C  omeprazole (PRILOSEC) 40 MG capsule Take 1 capsule twice daily before breakfast and dinner. Patient not taking: Reported on 10/16/2015 08/11/14   Amy S Esterwood, PA-C  ondansetron (ZOFRAN) 4 MG tablet Take 1 tablet (4 mg total) by mouth every 8 (eight) hours as needed for nausea or vomiting. Patient not taking: Reported on 10/16/2015 03/17/15   [name], MD  promethazine (PHENERGAN) 25 MG tablet Take 1 tablet (25 mg total) by mouth every 6 (six) hours as needed for nausea or vomiting. Patient not taking: Reported on 10/16/2015 09/16/14   [name], MD  warfarin (COUMADIN) 5 MG tablet Take 10mg  (2 tablets) by mouth on Sundays and thursdays. Take 7.5mg  (1.5 tablets) rest of days Patient not taking: Reported on 10/16/2015 07/18/15   Bartholomew Crews, MD   BP 183/101 mmHg  Pulse 93  Temp(Src) 98.6 F (37 C) (Oral)  Resp 16  Wt 111.993 kg  SpO2 100% Physical Exam  Constitutional: He appears well-developed and well-nourished.  HENT:  Head: Normocephalic and atraumatic.  Mouth/Throat: Dental caries present.  Patient has significant swelling with abscess to left upper molar. He also has periorbital swelling, tenderness to the left near as well as, submental tenderness and mild swelling. He does not have any lip swelling or tongue swelling. He does not have any trismus or pain down into the cervical tissue.  Eyes: Conjunctivae and EOM are normal. Pupils are equal, round, and reactive to light.  Neck: Normal range of motion. Neck supple.  Cardiovascular: Normal rate and regular  rhythm.   Pulmonary/Chest: Effort normal and breath sounds normal.  Nursing note and vitals reviewed.   ED Course  Procedures (including critical care time) Labs Review Labs Reviewed  COMPREHENSIVE METABOLIC PANEL - Abnormal; Notable for the following:    Chloride 95 (*)    ALT 14 (*)    All other components within normal limits  CBC WITH DIFFERENTIAL/PLATELET - Abnormal; Notable for the following:    Hemoglobin 12.3 (*)    HCT 37.6 (*)    MCV 70.1 (*)    MCH 22.9 (*)    RDW 15.7 (*)    All other components within normal limits  I-STAT CG4 LACTIC ACID, ED  I-STAT CG4 LACTIC ACID, ED    Imaging Review Ct  Maxillofacial W/cm  10/17/2015  ADDENDUM REPORT: 10/17/2015 02:33 ADDENDUM: Additional reformations submitted with larger field of view. Re- demonstration of LEFT facial soft tissue swelling extending to the nasal labial old, no abscess on these additional images. Electronically Signed   By: Elon Alas M.D.   On: 10/17/2015 02:33  10/17/2015  CLINICAL DATA:  LEFT upper tooth pain, LEFT facial swelling with spontaneous decompression. EXAM: CT MAXILLOFACIAL WITH CONTRAST TECHNIQUE: Multidetector CT imaging of the maxillofacial structures was performed with intravenous contrast. Multiplanar CT image reconstructions were also generated. A small metallic BB was placed on the right temple in order to reliably differentiate right from left. The patient was rescanned, as initial examination did not include lower face. CONTRAST:  75mL OMNIPAQUE IOHEXOL 300 MG/ML SOLN, 63mL OMNIPAQUE IOHEXOL 300 MG/ML SOLN, COMPARISON:  None. FINDINGS: Multiple bilateral dental caries with tooth 1 periapical abscess. Early suspected multiple additional periapical abscess. No facial fracture. No suspicious bony lesions. RIGHT maxillary mucosal retention cyst, mild paranasal sinus mucosal thickening without air-fluid levels. Nasal septum is midline. Bilateral concha bullosa. Ocular globes and orbital contents are  normal. Included intracranial contents are normal. The included LEFT facial subcutaneous fat demonstrates stranding, mildly thickened LEFT platysma. No focal fluid collections, subcutaneous gas or radiopaque foreign bodies. IMPRESSION: LEFT facial cellulitis without drainable fluid collection; findings may be odontogenic as there multiple dental caries. Tooth 1 periapical lucency/abscess. Electronically Signed: By: Elon Alas M.D. On: 10/17/2015 02:05   I have personally reviewed and evaluated these images and lab results as part of my medical decision-ntaking.  EKG Interpretation None      MDM   Final diagnoses:  Facial cellulitis  Dental abscess    On reevaluation of the patient he has already while in the ER had significantly more swelling of the face, the lips. He is not having any airway involvement, stridor or trismus at this time. Due to the rapid swallowing the patient will be admitted to the hospital. I spoke with Dr. Alcario Drought with [facility] hospitalist, he has agreed to admit the patient. Inpatient, [place], MedSurg.  Will need dental consult in the morning as patient has abscess and facial cellulitis.  Medications  oxyCODONE-acetaminophen (PERCOCET/ROXICET) 5-325 MG per tablet (not administered)  0.9 %  sodium chloride infusion (not administered)  clindamycin (CLEOCIN) IVPB 600 mg (not administered)  oxyCODONE-acetaminophen (PERCOCET/ROXICET) 5-325 MG per tablet 1 tablet (1 tablet Oral Given 10/16/15 1617)  clindamycin (CLEOCIN) IVPB 600 mg (0 mg Intravenous Stopped 10/17/15 0218)  sodium chloride 0.9 % bolus 1,000 mL (1,000 mLs Intravenous New Bag/Given 10/16/15 2346)  ondansetron (ZOFRAN) injection 4 mg (4 mg Intravenous Given 10/16/15 2350)  morphine 4 MG/ML injection 4 mg (4 mg Intravenous Given 10/16/15 2352)  iohexol (OMNIPAQUE) 300 MG/ML solution 75 mL (75 mLs Intravenous Contrast Given 10/17/15 0019)  morphine 4 MG/ML injection 4 mg (4 mg Intravenous Given 10/17/15 0230)         Delos Haring, PA-C 10/17/15 0303  Julianne Rice, MD 10/21/15 2237

## 2015-10-16 NOTE — Telephone Encounter (Signed)
Reviewed Thanks Agree w plan DrG

## 2015-10-16 NOTE — ED Notes (Signed)
Pt reports onset of left side dental pain on Friday night. Swelling noted to face, reports fever. Temp 100 at triage. Airway intact.

## 2015-10-16 NOTE — ED Provider Notes (Signed)
CSN: [id number]     Arrival date & time 10/16/15  1312 History   First MD Initiated Contact with Patient 10/16/15 1358     Chief Complaint  Patient presents with  . Dental Pain   (Consider location/radiation/quality/duration/timing/severity/associated sxs/prior Treatment) HPI Comments: 65 year old male complaining of an onset of left facial swelling and pain across the teeth 3 days ago. He states the swelling started around the left jaw and then included the left eye. He continues to have pain to the left upper row of teeth. It appears that tooth #12 is missing. The surrounding gingiva is erythematous and tender. Denies problems swallowing.  Patient is a 65 y.o. male presenting with tooth pain.  Dental Pain Location:  Upper Upper teeth location:  12/LU 1st bicuspid Quality:  Pulsating, shooting, throbbing and constant Severity:  Severe Onset quality:  Sudden Duration:  2 days Timing:  Constant Progression:  Worsening Chronicity:  New Context: abscess   Context: not trauma   Relieved by:  None tried Worsened by:  Touching and jaw movement Ineffective treatments:  None tried Associated symptoms: facial pain, facial swelling, fever, gum swelling, headaches and oral lesions   Associated symptoms: no congestion, no difficulty swallowing, no neck pain, no neck swelling and no oral bleeding   Risk factors: lack of dental care and smoking     Past Medical History  Diagnosis Date  . Hypertension   . History of blood clots   . Diverticulitis   . Hernia   . Diverticulosis   . Shingles   . Gout   . Back pain   . Neuropathy, peripheral ([place]) 05/15/2012  . DVT (deep venous thrombosis) ([place])   . Anxiety and depression   . Nausea and vomiting    Past Surgical History  Procedure Laterality Date  . Back surgery    . Hernia repair    . Hiatal hernia repair    . Appendectomy    . Cholecystectomy  2008  . Sacral nerve stimulator placement  2011  . Left heart catheterization with  coronary angiogram N/A 08/02/2014    Procedure: LEFT HEART CATHETERIZATION WITH CORONARY ANGIOGRAM;  Surgeon: Laverda Page, MD;  Location: [facility] CATH LAB;  Service: Cardiovascular;  Laterality: N/A;   Family History  Problem Relation Age of Onset  . Prostate cancer Father   . Heart disease Father   . Diabetes Mother   . Heart disease Mother   . Colon cancer Maternal Uncle     dx in his 47's  . Pancreatic cancer Paternal Uncle   . Prostate cancer Paternal Uncle   . Multiple sclerosis Daughter   . Prostate cancer Paternal Uncle    Social History  Substance Use Topics  . Smoking status: Current Every Day Smoker -- 0.50 packs/day for 40 years    Types: Cigarettes  . Smokeless tobacco: Never Used     Comment: sometimes less.  . Alcohol Use: No    Review of Systems  Constitutional: Positive for fever.  HENT: Positive for dental problem, facial swelling and mouth sores. Negative for congestion.   Respiratory: Negative.   Musculoskeletal: Negative for neck pain.  Neurological: Positive for headaches.    Allergies  Aspirin; Celecoxib; Ibuprofen; and Lyrica  Home Medications   Prior to Admission medications   Medication Sig Start Date End Date Taking? Authorizing Provider  acyclovir (ZOVIRAX) 400 MG tablet Take 2 tablets (800 mg total) by mouth 4 (four) times daily. Patient not taking: Reported on 10/16/2015 08/04/15  Chesley Noon Nadeau, PA-C  DULoxetine (CYMBALTA) 60 MG capsule Take 60 mg by mouth daily.     Historical Provider, MD  fluticasone (FLONASE) 50 MCG/ACT nasal spray Place 2 sprays into both nostrils at bedtime.  04/13/14   Historical Provider, MD  glycopyrrolate (ROBINUL) 2 MG tablet Take 1 tablet (2 mg total) by mouth 2 (two) times daily. 04/10/15   [name], MD  metoprolol tartrate (LOPRESSOR) 25 MG tablet Take 25 mg by mouth 2 (two) times daily. 07/29/14   Historical Provider, MD  NIFEDICAL XL 60 MG 24 hr tablet Take 120 mg by mouth daily.  02/21/14    Historical Provider, MD  omeprazole (PRILOSEC) 40 MG capsule Take 1 capsule twice daily before breakfast and dinner. 08/11/14   Amy S Esterwood, PA-C  ondansetron (ZOFRAN ODT) 8 MG disintegrating tablet Take 1 tablet (8 mg total) by mouth every 8 (eight) hours as needed for nausea or vomiting. 07/26/14   Tatyana Kirichenko, PA-C  ondansetron (ZOFRAN) 4 MG tablet Take 1 tablet (4 mg total) by mouth every 8 (eight) hours as needed for nausea or vomiting. 03/17/15   [name], MD  oxyCODONE-acetaminophen (PERCOCET) 10-325 MG tablet Take 1 tablet by mouth every 4 (four) hours as needed for pain.    Historical Provider, MD  PROAIR HFA 108 (90 BASE) MCG/ACT inhaler Inhale 1 puff into the lungs every 6 (six) hours as needed for wheezing.  03/23/14   Historical Provider, MD  promethazine (PHENERGAN) 25 MG tablet Take 1 tablet (25 mg total) by mouth every 6 (six) hours as needed for nausea or vomiting. 09/16/14   [name], MD  tiZANidine (ZANAFLEX) 4 MG tablet Take 4 mg by mouth every 6 (six) hours as needed for muscle spasms.  07/27/14   Historical Provider, MD  valsartan-hydrochlorothiazide (DIOVAN-HCT) 320-25 MG per tablet Take 1 tablet by mouth daily.    Historical Provider, MD  VOLTAREN 1 % GEL Apply 2 g topically daily as needed. For pain 02/23/14   Historical Provider, MD  warfarin (COUMADIN) 5 MG tablet Take 10mg  (2 tablets) by mouth on Sundays and thursdays. Take 7.5mg  (1.5 tablets) rest of days 07/18/15   Bartholomew Crews, MD   Meds Ordered and Administered this Visit   Medications  HYDROcodone-acetaminophen (NORCO/VICODIN) 5-325 MG per tablet 2 tablet (2 tablets Oral Given 10/16/15 1523)    BP 186/97 mmHg  Pulse 111  Temp(Src) 99.7 F (37.6 C) (Oral)  Resp 18  SpO2 97% No data found.   Physical Exam  Constitutional: He appears well-developed and well-nourished. No distress.  HENT:  Mouth/Throat: Oropharynx is clear and moist. No oropharyngeal exudate.  Erythema to the left  upper gingiva particularly around #12 and 13. Left facial swelling to the left jaw to just beneath the left eye. Left TM is normal.  Eyes: EOM are normal.  Neck: Normal range of motion. Neck supple.  Cardiovascular: Normal rate.   Pulmonary/Chest: Effort normal. No respiratory distress.  Neurological: He is alert. No cranial nerve deficit. He exhibits normal muscle tone.  Skin: Skin is warm and dry.  Nursing note and vitals reviewed.   ED Course  Procedures (including critical care time)  Labs Review Labs Reviewed - No data to display  Imaging Review No results found.   Visual Acuity Review  Right Eye Distance:   Left Eye Distance:   Bilateral Distance:    Right Eye Near:   Left Eye Near:    Bilateral Near:  MDM   1. Pain, dental   2. Dental infection   3. Facial infection    Norco 5 mg, 2 tablets by mouth now. Transfer to emergency department for evaluation of facial infection, dental pain in probable abscess. May need IV antibiotics or other intervention.    Janne Napoleon, NP 10/16/15 1529

## 2015-10-17 ENCOUNTER — Observation Stay (HOSPITAL_COMMUNITY): Payer: Medicare Other

## 2015-10-17 ENCOUNTER — Encounter (HOSPITAL_COMMUNITY): Payer: Self-pay | Admitting: Radiology

## 2015-10-17 ENCOUNTER — Emergency Department (HOSPITAL_COMMUNITY): Payer: Medicare Other

## 2015-10-17 DIAGNOSIS — F17210 Nicotine dependence, cigarettes, uncomplicated: Secondary | ICD-10-CM | POA: Diagnosis present

## 2015-10-17 DIAGNOSIS — K0401 Reversible pulpitis: Secondary | ICD-10-CM

## 2015-10-17 DIAGNOSIS — K083 Retained dental root: Secondary | ICD-10-CM

## 2015-10-17 DIAGNOSIS — M109 Gout, unspecified: Secondary | ICD-10-CM | POA: Diagnosis present

## 2015-10-17 DIAGNOSIS — L03211 Cellulitis of face: Secondary | ICD-10-CM | POA: Diagnosis not present

## 2015-10-17 DIAGNOSIS — Z86718 Personal history of other venous thrombosis and embolism: Secondary | ICD-10-CM | POA: Diagnosis not present

## 2015-10-17 DIAGNOSIS — K219 Gastro-esophageal reflux disease without esophagitis: Secondary | ICD-10-CM | POA: Diagnosis present

## 2015-10-17 DIAGNOSIS — R220 Localized swelling, mass and lump, head: Secondary | ICD-10-CM

## 2015-10-17 DIAGNOSIS — K047 Periapical abscess without sinus: Secondary | ICD-10-CM | POA: Diagnosis present

## 2015-10-17 DIAGNOSIS — Z79899 Other long term (current) drug therapy: Secondary | ICD-10-CM | POA: Diagnosis not present

## 2015-10-17 DIAGNOSIS — Z888 Allergy status to other drugs, medicaments and biological substances status: Secondary | ICD-10-CM | POA: Diagnosis not present

## 2015-10-17 DIAGNOSIS — G629 Polyneuropathy, unspecified: Secondary | ICD-10-CM | POA: Diagnosis not present

## 2015-10-17 DIAGNOSIS — Z885 Allergy status to narcotic agent status: Secondary | ICD-10-CM | POA: Diagnosis not present

## 2015-10-17 DIAGNOSIS — Z886 Allergy status to analgesic agent status: Secondary | ICD-10-CM | POA: Diagnosis not present

## 2015-10-17 DIAGNOSIS — M264 Malocclusion, unspecified: Secondary | ICD-10-CM

## 2015-10-17 DIAGNOSIS — Z7901 Long term (current) use of anticoagulants: Secondary | ICD-10-CM | POA: Diagnosis not present

## 2015-10-17 DIAGNOSIS — K08409 Partial loss of teeth, unspecified cause, unspecified class: Secondary | ICD-10-CM

## 2015-10-17 DIAGNOSIS — Z86711 Personal history of pulmonary embolism: Secondary | ICD-10-CM | POA: Diagnosis not present

## 2015-10-17 DIAGNOSIS — Z833 Family history of diabetes mellitus: Secondary | ICD-10-CM | POA: Diagnosis not present

## 2015-10-17 DIAGNOSIS — K045 Chronic apical periodontitis: Secondary | ICD-10-CM | POA: Diagnosis present

## 2015-10-17 DIAGNOSIS — F329 Major depressive disorder, single episode, unspecified: Secondary | ICD-10-CM | POA: Diagnosis present

## 2015-10-17 DIAGNOSIS — I10 Essential (primary) hypertension: Secondary | ICD-10-CM | POA: Diagnosis not present

## 2015-10-17 DIAGNOSIS — K029 Dental caries, unspecified: Secondary | ICD-10-CM | POA: Diagnosis present

## 2015-10-17 DIAGNOSIS — F419 Anxiety disorder, unspecified: Secondary | ICD-10-CM | POA: Diagnosis present

## 2015-10-17 DIAGNOSIS — Z8249 Family history of ischemic heart disease and other diseases of the circulatory system: Secondary | ICD-10-CM | POA: Diagnosis not present

## 2015-10-17 LAB — PROTIME-INR
INR: 1.72 — ABNORMAL HIGH (ref 0.00–1.49)
Prothrombin Time: 20.1 seconds — ABNORMAL HIGH (ref 11.6–15.2)

## 2015-10-17 LAB — HEPARIN LEVEL (UNFRACTIONATED): Heparin Unfractionated: <0.1 IU/mL — ABNORMAL LOW (ref 0.30–0.70)

## 2015-10-17 MED ORDER — DULOXETINE HCL 60 MG PO CPEP
120.0000 mg | ORAL_CAPSULE | Freq: Every day | ORAL | Status: DC
Start: 2015-10-17 — End: 2015-10-20
  Administered 2015-10-17 – 2015-10-20 (×3): 120 mg via ORAL
  Filled 2015-10-17 (×3): qty 2

## 2015-10-17 MED ORDER — NIFEDIPINE ER 60 MG PO TB24
120.0000 mg | ORAL_TABLET | Freq: Every day | ORAL | Status: DC
  Administered 2015-10-17 – 2015-10-20 (×3): 120 mg via ORAL
  Filled 2015-10-17 (×4): qty 2

## 2015-10-17 MED ORDER — CLINDAMYCIN PHOSPHATE 600 MG/50ML IV SOLN
600.0000 mg | Freq: Three times a day (TID) | INTRAVENOUS | Status: DC
  Administered 2015-10-17 – 2015-10-20 (×10): 600 mg via INTRAVENOUS
  Filled 2015-10-17 (×12): qty 50

## 2015-10-17 MED ORDER — HEPARIN (PORCINE) IN NACL 100-0.45 UNIT/ML-% IJ SOLN
1600.0000 [IU]/h | INTRAMUSCULAR | Status: DC
  Administered 2015-10-17: 1600 [IU]/h via INTRAVENOUS
  Filled 2015-10-17 (×3): qty 250

## 2015-10-17 MED ORDER — IOHEXOL 300 MG/ML  SOLN
75.0000 mL | Freq: Once | INTRAMUSCULAR | Status: AC | PRN
  Administered 2015-10-17: 75 mL via INTRAVENOUS

## 2015-10-17 MED ORDER — ALBUTEROL SULFATE HFA 108 (90 BASE) MCG/ACT IN AERS: 1.0000 | INHALATION_SPRAY | Freq: Four times a day (QID) | RESPIRATORY_TRACT | Status: DC | PRN

## 2015-10-17 MED ORDER — MORPHINE SULFATE (PF) 2 MG/ML IV SOLN
2.0000 mg | INTRAVENOUS | Status: DC | PRN
Start: 2015-10-17 — End: 2015-10-20
  Administered 2015-10-17 – 2015-10-20 (×13): 2 mg via INTRAVENOUS
  Filled 2015-10-17 (×13): qty 1

## 2015-10-17 MED ORDER — DICLOFENAC SODIUM 1 % TD GEL: 2.0000 g | Freq: Every day | TRANSDERMAL | Status: DC | PRN

## 2015-10-17 MED ORDER — HEPARIN SODIUM (PORCINE) 5000 UNIT/ML IJ SOLN
5000.0000 [IU] | Freq: Three times a day (TID) | INTRAMUSCULAR | Status: DC
  Administered 2015-10-17 (×2): 5000 [IU] via SUBCUTANEOUS
  Filled 2015-10-17 (×2): qty 1

## 2015-10-17 MED ORDER — HYDROCHLOROTHIAZIDE 25 MG PO TABS: 25.0000 mg | ORAL_TABLET | Freq: Every day | ORAL | Status: DC

## 2015-10-17 MED ORDER — SODIUM CHLORIDE 0.9 % IV SOLN: INTRAVENOUS | Status: DC

## 2015-10-17 MED ORDER — MORPHINE SULFATE (PF) 4 MG/ML IV SOLN
4.0000 mg | Freq: Once | INTRAVENOUS | Status: AC
  Administered 2015-10-17: 4 mg via INTRAVENOUS
  Filled 2015-10-17: qty 1

## 2015-10-17 MED ORDER — HYDRALAZINE HCL 20 MG/ML IJ SOLN
10.0000 mg | Freq: Four times a day (QID) | INTRAMUSCULAR | Status: DC | PRN
Start: 2015-10-17 — End: 2015-10-20
  Administered 2015-10-19: 10 mg via INTRAVENOUS
  Filled 2015-10-17: qty 1

## 2015-10-17 MED ORDER — TIZANIDINE HCL 2 MG PO TABS
4.0000 mg | ORAL_TABLET | Freq: Four times a day (QID) | ORAL | Status: DC | PRN
  Administered 2015-10-19 (×2): 4 mg via ORAL
  Filled 2015-10-17: qty 2
  Filled 2015-10-17: qty 1
  Filled 2015-10-17: qty 2

## 2015-10-17 MED ORDER — INFLUENZA VAC SPLIT QUAD 0.5 ML IM SUSY: 0.5000 mL | PREFILLED_SYRINGE | INTRAMUSCULAR | Status: DC

## 2015-10-17 MED ORDER — METOPROLOL TARTRATE 25 MG PO TABS
25.0000 mg | ORAL_TABLET | Freq: Two times a day (BID) | ORAL | Status: DC
  Administered 2015-10-17 – 2015-10-20 (×7): 25 mg via ORAL
  Filled 2015-10-17 (×7): qty 1

## 2015-10-17 MED ORDER — OXYCODONE-ACETAMINOPHEN 5-325 MG PO TABS
1.0000 | ORAL_TABLET | ORAL | Status: DC | PRN
  Administered 2015-10-17 – 2015-10-18 (×4): 1 via ORAL
  Filled 2015-10-17 (×5): qty 1

## 2015-10-17 MED ORDER — VALSARTAN-HYDROCHLOROTHIAZIDE 320-25 MG PO TABS: 1.0000 | ORAL_TABLET | Freq: Every day | ORAL | Status: DC

## 2015-10-17 MED ORDER — GLYCOPYRROLATE 1 MG PO TABS
2.0000 mg | ORAL_TABLET | Freq: Two times a day (BID) | ORAL | Status: DC
  Administered 2015-10-17 – 2015-10-20 (×6): 2 mg via ORAL
  Filled 2015-10-17 (×10): qty 2

## 2015-10-17 MED ORDER — PNEUMOCOCCAL VAC POLYVALENT 25 MCG/0.5ML IJ INJ: 0.5000 mL | INJECTION | INTRAMUSCULAR | Status: DC

## 2015-10-17 MED ORDER — IRBESARTAN 300 MG PO TABS: 300.0000 mg | ORAL_TABLET | Freq: Every day | ORAL | Status: DC

## 2015-10-17 MED ORDER — OXYCODONE-ACETAMINOPHEN 10-325 MG PO TABS: 1.0000 | ORAL_TABLET | ORAL | Status: DC | PRN

## 2015-10-17 MED ORDER — ALBUTEROL SULFATE (2.5 MG/3ML) 0.083% IN NEBU: 2.5000 mg | INHALATION_SOLUTION | Freq: Four times a day (QID) | RESPIRATORY_TRACT | Status: DC | PRN

## 2015-10-17 MED ORDER — NICOTINE 21 MG/24HR TD PT24
21.0000 mg | MEDICATED_PATCH | Freq: Every day | TRANSDERMAL | Status: DC
Start: 2015-10-17 — End: 2015-10-20
  Administered 2015-10-17 – 2015-10-20 (×3): 21 mg via TRANSDERMAL
  Filled 2015-10-17 (×3): qty 1

## 2015-10-17 MED ORDER — ONDANSETRON HCL 4 MG PO TABS: 4.0000 mg | ORAL_TABLET | Freq: Three times a day (TID) | ORAL | Status: DC | PRN

## 2015-10-17 MED ORDER — OXYCODONE HCL 5 MG PO TABS
5.0000 mg | ORAL_TABLET | ORAL | Status: DC | PRN
  Administered 2015-10-17: 5 mg via ORAL
  Filled 2015-10-17: qty 1

## 2015-10-17 NOTE — Progress Notes (Signed)
[place] for Heparin Indication: Hx VTE  Allergies  Allergen Reactions  . Aspirin Other (See Comments)    ulcers  . Celecoxib Other (See Comments)    Stomach irritation  . Ibuprofen Other (See Comments)    ulcers  . Lyrica [Pregabalin] Swelling  . Vicodin [Hydrocodone-Acetaminophen]     Makes me mean    Patient Measurements: Height: 6\' 4"  (193 cm) Weight: 223 lb 12.3 oz (101.5 kg) IBW/kg (Calculated) : 86.8  Vital Signs: Temp: 98.5 F (36.9 C) (02/07 2150) Temp Source: Oral (02/07 2150) BP: 132/74 mmHg (02/07 2150) Pulse Rate: 81 (02/07 2150)  Labs:  Recent Labs  10/16/15 1627 10/17/15 0457 10/17/15 2150  HGB 12.3*  --   --   HCT 37.6*  --   --   PLT 215  --   --   LABPROT  --  20.1*  --   INR  --  1.72*  --   HEPARINUNFRC  --   --  <0.10*  CREATININE 0.93  --   --     Estimated Creatinine Clearance: 97.2 mL/min (by C-G formula based on Cr of 0.93).   Assessment: 65 y/o M here with dental pain/facial swelling. On warfarin PTA for hx VTE. INR 1.72. PTA Warfarin dose per outpatient anti-coag notes from 10/09/15 is 10 mg Mon/Wed/Fri, 7.5 mg all other days. MD request to hold coumadin 2/7 and bridge with heparin for teeth extraction on 2/9.   Heparin level undetectable however when the phlebotomist went to get the heparin level at 2300, the night RN noticed that the heparin pump was off - night RN unsure when the pump was turned off but likely before his shift started so at least 3 hours. No bleeding reported per RN.  Goal of Therapy:  INR 2-3; heparin level 0.3-0.7 units/ml Monitor platelets by anticoagulation protocol: Yes   Plan:  - Continue heparin gtt at 1600 units/hr - Will f/u a.m. heparin level since heparin off for unknown amount of time this evening  Sherlon Handing, PharmD, BCPS Clinical pharmacist, pager [phone] 10/17/2015 11:29 PM

## 2015-10-17 NOTE — Progress Notes (Signed)
Received patient from ED with a swollen left face from left upper molar dental abscess and pain.  Patient AOx4, ambulatory, VS stable with elevated BP at 166/99, pain at 3/10 and O2Sat at 99% on RA.  Patient oriented to room, bed controls and call light. Patient resting on bed comfortably watching TV as of this writing.

## 2015-10-17 NOTE — Progress Notes (Addendum)
[place] for Warfarin  Indication: Hx VTE  Allergies  Allergen Reactions  . Aspirin Other (See Comments)    ulcers  . Celecoxib Other (See Comments)    Stomach irritation  . Ibuprofen Other (See Comments)    ulcers  . Lyrica [Pregabalin] Swelling  . Vicodin [Hydrocodone-Acetaminophen]     Makes me mean    Patient Measurements: Height: 6\' 4"  (193 cm) Weight: 223 lb 12.3 oz (101.5 kg) IBW/kg (Calculated) : 86.8  Vital Signs: Temp: 98.5 F (36.9 C) (02/07 0426) Temp Source: Oral (02/07 0426) BP: 166/99 mmHg (02/07 0426) Pulse Rate: 87 (02/07 0426)  Labs:  Recent Labs  10/16/15 1627 10/17/15 0457  HGB 12.3*  --   HCT 37.6*  --   PLT 215  --   LABPROT  --  20.1*  INR  --  1.72*  CREATININE 0.93  --     Estimated Creatinine Clearance: 97.2 mL/min (by C-G formula based on Cr of 0.93).   Assessment: 65 y/o M here with dental pain/facial swelling. On warfarin PTA for hx VTE. INR 1.72.  PTA Warfarin dose per outpatient anti-coag notes from 10/09/15 is 10 mg Mon/Wed/Fri, 7.5 mg all other days MD request to hold coumadin 2/7.  On sq heparin for VTE px.  CBC OK.  No bleeding noted.  Goal of Therapy:  INR 2-3 Monitor platelets by anticoagulation protocol: Yes   Plan:  Hold coumadin 2/7 per MD request On sq heparin 5K TID for VTE px To get dental consult today Daily INR  Eudelia Bunch, Pharm.D. [phone] 10/17/2015 8:12 AM    Addendum: pharmacy consulted to dose heparin for bridge therapy while coumadin on hold for dental surgery.  Sq heparin 5k given at 1249.  Wt 101.5 kg.  Plan: DC sq heparin, start heparin drip with no bolus at 1600 units/hr and check 6 hr HL - daily HL/CBC/INR - to OR Thursday 2/9 for teeth extraction  Eudelia Bunch, Pharm.D. BP:[phone] 10/17/2015 1:01 PM

## 2015-10-17 NOTE — ED Notes (Signed)
Pt st's after brushing his teeth 3 days ago he developed pain in the gums on left side of mouth.  St's left side of face started to swell shortly after.  Pt has continued to have pain and swelling to face

## 2015-10-17 NOTE — Progress Notes (Signed)
Patient Demographics:    [name] [name], is a 65 y.o. male, DOB - 1951/05/26, [id number]  Admit date - 10/16/2015   Admitting Physician Etta Quill, DO  Outpatient Primary MD for the patient is [name], MD  LOS - 0   Chief Complaint  Patient presents with  . Dental Pain        Subjective:    [name] today has, No headache, No chest pain, No abdominal pain - No Nausea, No new weakness tingling or numbness, No Cough - SOB. L facial pain.    Assessment  & Plan :     1.L Facial cellulitis and dental abscess - on IV ABX, Dentist called  2. DVT multilpe - hold coumadin, Hep gtt  3.HTN - coreg  4.Smoking - counseled, N.patch.    Code Status : Full  Family Communication  : none  Disposition Plan  : TBD  Consults  :  Dentist  Procedures  :   CT face and Orthopantogram - Dental abscess and facial cellulits  DVT Prophylaxis  :   Heparin   Lab Results  Component Value Date   PLT 215 10/16/2015    Inpatient Medications  Scheduled Meds: . clindamycin (CLEOCIN) IV  600 mg Intravenous 3 times per day  . DULoxetine  120 mg Oral Daily  . glycopyrrolate  2 mg Oral BID  . heparin  5,000 Units Subcutaneous 3 times per day  . [START ON 10/18/2015] Influenza vac split quadrivalent PF  0.5 mL Intramuscular Tomorrow-1000  . metoprolol tartrate  25 mg Oral BID  . nicotine  21 mg Transdermal Daily  . NIFEdipine  120 mg Oral Daily  . [START ON 10/18/2015] pneumococcal 23 valent vaccine  0.5 mL Intramuscular Tomorrow-1000   Continuous Infusions:  PRN Meds:.albuterol, diclofenac sodium, ondansetron, oxyCODONE-acetaminophen **AND** [DISCONTINUED] oxyCODONE, tiZANidine  Antibiotics  :    Anti-infectives    Start     Dose/Rate Route Frequency Ordered Stop   10/17/15 0600  clindamycin  (CLEOCIN) IVPB 600 mg     600 mg 100 mL/hr over 30 Minutes Intravenous 3 times per day 10/17/15 0253     10/16/15 2300  clindamycin (CLEOCIN) IVPB 600 mg     600 mg 100 mL/hr over 30 Minutes Intravenous  Once 10/16/15 2245 10/17/15 0218        Objective:   Filed Vitals:   10/16/15 2034 10/16/15 2315 10/17/15 0000 10/17/15 0426  BP: 159/112 165/101 183/101 166/99  Pulse: 102 96 93 87  Temp: 98.6 F (37 C)   98.5 F (36.9 C)  TempSrc:    Oral  Resp: 16   18  Height:    6\' 4"  (1.93 m)  Weight:    101.5 kg (223 lb 12.3 oz)  SpO2: 99% 100% 100% 99%    Wt Readings from Last 3 Encounters:  10/17/15 101.5 kg (223 lb 12.3 oz)  08/04/15 115.667 kg (255 lb)  05/09/15 110.859 kg (244 lb 6.4 oz)     Intake/Output Summary (Last 24 hours) at 10/17/15 1240 Last data filed at 10/17/15 0943  Gross per 24 hour  Intake   1820 ml  Output      0 ml  Net   1820 ml  Physical Exam  Awake Alert, Oriented X 3, No new F.N deficits, Normal affect [place].AT,PERRAL, L upper maxilla large dental abscess Supple Neck,No JVD, No cervical lymphadenopathy appriciated.  Symmetrical Chest wall movement, Good air movement bilaterally, CTAB RRR,No Gallops,Rubs or new Murmurs, No Parasternal Heave +ve B.Sounds, Abd Soft, No tenderness, No organomegaly appriciated, No rebound - guarding or rigidity. No Cyanosis, Clubbing or edema, No new Rash or bruise       Data Review:   Micro Results No results found for this or any previous visit (from the past 240 hour(s)).  Radiology Reports Dg Orthopantogram  10/17/2015  CLINICAL DATA:  Dental abscess, left upper. Two days of pain and swelling. EXAM: ORTHOPANTOGRAM/PANORAMIC COMPARISON:  10/17/2015 FINDINGS: The dental cavities shown on today's CT scan are somewhat less conspicuous on today's Panorex. Tooth # 1 is absent. Tooth # 2 is severely decayed with most of the tooth (particularly the crown) missing ; this is the tooth with the periapical lucency on prior  CT scan. This was referred to as tooth # 1 on CT but is thought actually be tooth # 2. The very large cavity of tooth # 12 is observed, with absent crown. Smaller cavities such as the posterior-labial cavity of tooth # 16 are visible but less conspicuous compared to the CT. IMPRESSION: 1. Very large cavities of teeth # 2 and # 12. The periapical lucency referred to on the prior CT is associated with the roots of tooth # 2. Tooth # 1 is absent. 2. Dental cavities. Electronically Signed   By: Van Clines M.D.   On: 10/17/2015 09:15   Ct Maxillofacial W/cm  10/17/2015  ADDENDUM REPORT: 10/17/2015 02:33 ADDENDUM: Additional reformations submitted with larger field of view. Re- demonstration of LEFT facial soft tissue swelling extending to the nasal labial old, no abscess on these additional images. Electronically Signed   By: Elon Alas M.D.   On: 10/17/2015 02:33  10/17/2015  CLINICAL DATA:  LEFT upper tooth pain, LEFT facial swelling with spontaneous decompression. EXAM: CT MAXILLOFACIAL WITH CONTRAST TECHNIQUE: Multidetector CT imaging of the maxillofacial structures was performed with intravenous contrast. Multiplanar CT image reconstructions were also generated. A small metallic BB was placed on the right temple in order to reliably differentiate right from left. The patient was rescanned, as initial examination did not include lower face. CONTRAST:  15mL OMNIPAQUE IOHEXOL 300 MG/ML SOLN, 22mL OMNIPAQUE IOHEXOL 300 MG/ML SOLN, COMPARISON:  None. FINDINGS: Multiple bilateral dental caries with tooth 1 periapical abscess. Early suspected multiple additional periapical abscess. No facial fracture. No suspicious bony lesions. RIGHT maxillary mucosal retention cyst, mild paranasal sinus mucosal thickening without air-fluid levels. Nasal septum is midline. Bilateral concha bullosa. Ocular globes and orbital contents are normal. Included intracranial contents are normal. The included LEFT facial  subcutaneous fat demonstrates stranding, mildly thickened LEFT platysma. No focal fluid collections, subcutaneous gas or radiopaque foreign bodies. IMPRESSION: LEFT facial cellulitis without drainable fluid collection; findings may be odontogenic as there multiple dental caries. Tooth 1 periapical lucency/abscess. Electronically Signed: By: Elon Alas M.D. On: 10/17/2015 02:05     CBC  Recent Labs Lab 10/16/15 1627  WBC 6.0  HGB 12.3*  HCT 37.6*  PLT 215  MCV 70.1*  MCH 22.9*  MCHC 32.7  RDW 15.7*  LYMPHSABS 1.4  MONOABS 0.9  EOSABS 0.0  BASOSABS 0.0    Chemistries   Recent Labs Lab 10/16/15 1627  NA 135  K 4.0  CL 95*  CO2 28  GLUCOSE  90  BUN 6  CREATININE 0.93  CALCIUM 9.1  AST 19  ALT 14*  ALKPHOS 42  BILITOT 0.6   ------------------------------------------------------------------------------------------------------------------ No results for input(s): CHOL, HDL, LDLCALC, TRIG, CHOLHDL, LDLDIRECT in the last 72 hours.  No results found for: HGBA1C ------------------------------------------------------------------------------------------------------------------ No results for input(s): TSH, T4TOTAL, T3FREE, THYROIDAB in the last 72 hours.  Invalid input(s): FREET3 ------------------------------------------------------------------------------------------------------------------ No results for input(s): VITAMINB12, FOLATE, FERRITIN, TIBC, IRON, RETICCTPCT in the last 72 hours.  Coagulation profile  Recent Labs Lab 10/17/15 0457  INR 1.72*    No results for input(s): DDIMER in the last 72 hours.  Cardiac Enzymes No results for input(s): CKMB, TROPONINI, MYOGLOBIN in the last 168 hours.  Invalid input(s): CK ------------------------------------------------------------------------------------------------------------------ No results found for: BNP  Time Spent in minutes   35   [place] K M.D on 10/17/2015 at 12:40 PM  Between 7am to 7pm  - Pager - [phone]  After 7pm go to [email or web address] - password [facility]  [facility] -  Office  [phone]

## 2015-10-17 NOTE — ED Notes (Signed)
Patient transported to CT 

## 2015-10-17 NOTE — Progress Notes (Signed)
DENTAL CONSULTATION  Date of Consultation:  10/17/2015 Patient Name:   [name] [name] Date of Birth:   June 29, 1951 Medical Record Number: [id number]  VITALS: BP 166/99 mmHg  Pulse 87  Temp(Src) 98.5 F (36.9 C) (Oral)  Resp 18  Ht 6\' 4"  (1.93 m)  Wt 223 lb 12.3 oz (101.5 kg)  BMI 27.25 kg/m2  SpO2 99%  CHIEF COMPLAINT: Patient is referred for evaluation of left facial swelling.  HPI: [name] [name] is a 65 year old male recently admitted with left facial swelling and placed on IV antibiotic therapy. Dental consultation was then requested to evaluate patient for dental etiology and provide treatment as indicated.  Patient has been complaining of left facial pain and swelling over the past 4 days. Patient started experiencing problems on Friday, 10/13/2015.  Pain and swelling worsened to the point that he went to the emergency room and was admitted and placed on IV antibiotic therapy. Patient indicates that the facial swelling has decreased somewhat since the administration of IV antibiotic therapy.  Pain is described as being sharp and constant in nature.  Pain reached an intensity of 10 out of 10 and is currently 9 out of 10 by patient report. Patient indicates that the pain is relieved somewhat with the current oral pain medication.  Patient last saw a dentist in 2009 in [place]. Patient had a tooth pulled at that time with no complications. Warfarin therapy was discontinued for 7 days prior to that dental extraction without complications. Patient does not have a regular primary dentist. Patient does not seek regular dental care. Patient does not have any partial dentures.  PROBLEM LIST: Patient Active Problem List   Diagnosis Date Noted  . Dental abscess 10/17/2015  . Facial cellulitis 10/17/2015  . Angina pectoris ([place]) 08/01/2014  . Hemorrhage of rectum and anus 03/28/2014  . Neuropathy, peripheral ([place]) 05/15/2012  . Pulmonary embolism ([place]) 07/11/2011  . DVT (deep venous  thrombosis) ([place]) 07/11/2011  . Abdominal pain, left lower quadrant 01/29/2011  . Long term (current) use of anticoagulants 01/29/2011  . Diarrhea 01/29/2011    PMH: Past Medical History  Diagnosis Date  . Hypertension   . History of blood clots   . Diverticulitis   . Hernia   . Diverticulosis   . Shingles   . Gout   . Back pain   . Neuropathy, peripheral ([place]) 05/15/2012  . DVT (deep venous thrombosis) ([place])   . Anxiety and depression   . Nausea and vomiting     PSH: Past Surgical History  Procedure Laterality Date  . Back surgery    . Hernia repair    . Hiatal hernia repair    . Appendectomy    . Cholecystectomy  2008  . Sacral nerve stimulator placement  2011  . Left heart catheterization with coronary angiogram N/A 08/02/2014    Procedure: LEFT HEART CATHETERIZATION WITH CORONARY ANGIOGRAM;  Surgeon: Laverda Page, MD;  Location: [facility] CATH LAB;  Service: Cardiovascular;  Laterality: N/A;    ALLERGIES: Allergies  Allergen Reactions  . Aspirin Other (See Comments)    ulcers  . Celecoxib Other (See Comments)    Stomach irritation  . Ibuprofen Other (See Comments)    ulcers  . Lyrica [Pregabalin] Swelling  . Vicodin [Hydrocodone-Acetaminophen]     Makes me mean     MEDICATIONS: Current Facility-Administered Medications  Medication Dose Route Frequency Provider Last Rate Last Dose  . albuterol (PROVENTIL) (2.5 MG/3ML) 0.083% nebulizer solution 2.5 mg  2.5 mg  Inhalation Q6H PRN Etta Quill, DO      . clindamycin (CLEOCIN) IVPB 600 mg  600 mg Intravenous 3 times per day Etta Quill, DO   600 mg at 10/17/15 [id number]  . diclofenac sodium (VOLTAREN) 1 % transdermal gel 2 g  2 g Topical Daily PRN Etta Quill, DO      . DULoxetine (CYMBALTA) DR capsule 120 mg  120 mg Oral Daily Etta Quill, DO   120 mg at 10/17/15 0945  . glycopyrrolate (ROBINUL) tablet 2 mg  2 mg Oral BID Etta Quill, DO      . heparin injection 5,000 Units  5,000 Units Subcutaneous  3 times per day Etta Quill, DO   5,000 Units at 10/17/15 0609  . metoprolol tartrate (LOPRESSOR) tablet 25 mg  25 mg Oral BID Etta Quill, DO   25 mg at 10/17/15 0946  . nicotine (NICODERM CQ - dosed in mg/24 hours) patch 21 mg  21 mg Transdermal Daily Thurnell Lose, MD   21 mg at 10/17/15 0945  . NIFEdipine (PROCARDIA-XL/ADALAT CC) 24 hr tablet 120 mg  120 mg Oral Daily Etta Quill, DO      . ondansetron [facility]) tablet 4 mg  4 mg Oral Q8H PRN Etta Quill, DO      . oxyCODONE-acetaminophen (PERCOCET/ROXICET) 5-325 MG per tablet 1 tablet  1 tablet Oral Q4H PRN Etta Quill, DO   1 tablet at 10/17/15 0946  . tiZANidine (ZANAFLEX) tablet 4 mg  4 mg Oral Q6H PRN Etta Quill, DO        LABS: Lab Results  Component Value Date   WBC 6.0 10/16/2015   HGB 12.3* 10/16/2015   HCT 37.6* 10/16/2015   MCV 70.1* 10/16/2015   PLT 215 10/16/2015      Component Value Date/Time   NA 135 10/16/2015 1627   NA 143 04/04/2014 1116   K 4.0 10/16/2015 1627   K 3.7 04/04/2014 1116   CL 95* 10/16/2015 1627   CO2 28 10/16/2015 1627   CO2 30* 04/04/2014 1116   GLUCOSE 90 10/16/2015 1627   GLUCOSE 89 04/04/2014 1116   BUN 6 10/16/2015 1627   BUN 10.1 04/04/2014 1116   CREATININE 0.93 10/16/2015 1627   CREATININE 1.0 04/04/2014 1116   CALCIUM 9.1 10/16/2015 1627   CALCIUM 9.3 04/04/2014 1116   GFRNONAA >60 10/16/2015 1627   GFRAA >60 10/16/2015 1627   Lab Results  Component Value Date   INR 1.72* 10/17/2015   INR 1.60 10/09/2015   INR 3.0 08/21/2015   PROTIME 21.6* 05/19/2015   PROTIME 24.0* 04/25/2015   PROTIME 13.2 04/17/2015   No results found for: PTT  SOCIAL HISTORY: Social History   Social History  . Marital Status: Married    Spouse Name: Water quality scientist  . Number of Children: 3  . Years of Education: 12th   Occupational History  . unemployed Other  .     Social History Main Topics  . Smoking status: Current Every Day Smoker -- 0.50 packs/day for 40  years    Types: Cigarettes  . Smokeless tobacco: Never Used     Comment: sometimes less.  . Alcohol Use: No  . Drug Use: No  . Sexual Activity: Not on file   Other Topics Concern  . Not on file   Social History Narrative   Patient lives at home with his family.   Caffeine use: 1-2 cups daily  FAMILY HISTORY: Family History  Problem Relation Age of Onset  . Prostate cancer Father   . Heart disease Father   . Diabetes Mother   . Heart disease Mother   . Colon cancer Maternal Uncle     dx in his 34's  . Pancreatic cancer Paternal Uncle   . Prostate cancer Paternal Uncle   . Multiple sclerosis Daughter   . Prostate cancer Paternal Uncle     REVIEW OF SYSTEMS: As per history of present illness.   DENTAL HISTORY:  CHIEF COMPLAINT: Patient is referred for evaluation of left facial swelling.  HPI: [name] [name] is a 65 year old male recently admitted with left facial swelling and placed on IV antibiotic therapy. Dental consultation was then requested to evaluate patient for dental etiology and provide treatment as indicated.  Patient has been complaining of left facial pain and swelling over the past 4 days. Patient started experiencing problems on Friday, 10/13/2015.  Pain and swelling worsened to the point that he went to the emergency room and was admitted and placed on IV antibiotic therapy. Patient indicates that the facial swelling has decreased somewhat since the administration of IV antibiotic therapy.  Pain is described as being sharp and constant in nature.  Pain reached an intensity of 10 out of 10 and is currently 9 out of 10 by patient report. Patient indicates that the pain is relieved somewhat with the current oral pain medication.  Patient last saw a dentist in 2009 in [place]. Patient had a tooth pulled at that time with no complications. Warfarin therapy was discontinued for 7 days prior to that dental extraction without complications. Patient does not have a  regular primary dentist. Patient does not seek regular dental care. Patient does not have any partial dentures.  DENTAL EXAMINATION: GENERAL: The patient is a well-developed, well-nourished male in no acute distress. HEAD AND NECK: Patient has left facial swelling primarily in the maxillary/infraorbital area. Left neck lymphadenopathy is noted. There is no right neck lymphadenopathy. INTRAORAL EXAM: Patient has normal saliva. There is evidence of buccal abscess in the buccal vestibule area #12. Patient has bilateral, multilobular mandibular lingual tori. Patient has lateral exostoses of the maxilla bilaterally. DENTITION: Patient is missing tooth numbers 1, 17, and 32. Retained roots in the area of tooth numbers 2 and 12. Tooth #16 has distal drift and superior eruption. PERIODONTAL: Patient has chronic periodontitis with plaque and calculus cancellations, selective areas gingival recession, and moderate bone loss. DENTAL CARIES/SUBOPTIMAL RESTORATIONS: Multiple dental caries are noted especially involving tooth numbers 2 and 12.  The patient would need a full series of dental radius to rule out other incipient dental caries. ENDODONTIC: Patient has a history of acute pulpitis symptoms involving tooth #12. Patient has buccal abscess in the area of tooth #12. Patient appears to have periapical pathology associated with tooth numbers 2 and 12. CROWN AND BRIDGE: There are no crown or bridge restorations. PROSTHODONTIC: The patient does not have partial dentures. OCCLUSION: Patient has a poor occlusal scheme secondary to multiple missing teeth, multiple retained root segments, supra-eruption and drifting of the unopposed teeth into the edentulous areas, and lack of replacement of missing teeth with dental prostheses.  RADIOGRAPHIC INTERPRETATION: An orthopantogram was taken on 10/17/15. There are missing tooth numbers 1, 17, and 32. There are retained roots in the area of tooth numbers 2 and 12. Dental  caries are noted. There is moderate bone loss. There is radiographic calculus noted. There is a periapical radiolucency associated with  the apices of tooth numbers 2 and 12. There is supra-eruption and drifting of the unopposed teeth into the edentulous areas. There are radiopaque areas consistent with bilateral mandibular lingual tori and maxillary lateral exostoses. Cloudiness of the maxillary right sinus consistent with mucous retention cyst noted on the CT scan report.  EXAM: ORTHOPANTOGRAM/PANORAMIC  COMPARISON: 10/17/2015  FINDINGS: The dental cavities shown on today's CT scan are somewhat less conspicuous on today's Panorex. Tooth # 1 is absent. Tooth # 2 is severely decayed with most of the tooth (particularly the crown) missing ; this is the tooth with the periapical lucency on prior CT scan. This was referred to as tooth # 1 on CT but is thought actually be tooth # 2.  The very large cavity of tooth # 12 is observed, with absent crown. Smaller cavities such as the posterior-labial cavity of tooth # 16 are visible but less conspicuous compared to the CT.  IMPRESSION: 1. Very large cavities of teeth # 2 and # 12. The periapical lucency referred to on the prior CT is associated with the roots of tooth # 2. Tooth # 1 is absent. 2. Dental cavities.   Electronically Signed  By: Van Clines M.D.  On: 10/17/2015 09:15   ASSESSMENTS: 1. Left facial swelling 2. Acute pulpitis 3. Periapical abscess without fistulous tract in the area of tooth #12 4. Chronic apical periodontitis 5. Dental caries 6. Retained root segments 7. Chronic periodontitis with bone loss 8. Gingival recession 9. Accretions 10. Multiple missing teeth 11. Supra-eruption and drifting of the unopposed teeth into the edentulous areas 12. Malocclusion 13. Chronic anticoagulation with the risk for bleeding with invasive dental procedures   PLAN/RECOMMENDATIONS: 1. I discussed the risks,  benefits, and complications of various treatment options with the patient in relationship to his medical and dental conditions. We discussed various treatment options to include no treatment, multiple extractions with alveoloplasty, pre-prosthetic surgery as indicated, and follow-up care with the primary dentist for evaluation for level dental treatment needs once medically stable.The patient currently wishes to proceed with extraction of tooth numbers 2, 12, and 16 with alveoloplasty in the operating room with general anesthesia. This has been scheduled for Thursday, 10/19/2015 at 7:30 AM.  Warfarin therapy is to continue to be held at this time with either anticoagulant therapy as indicated. Alternatively, the patient can be referred to an oral surgeon for outpatient treatment as indicated.  2. Discussion of findings with medical team and coordination of future medical and dental care as needed.    Lenn Cal, DDS

## 2015-10-17 NOTE — Progress Notes (Signed)
ANTICOAGULATION CONSULT NOTE - Initial Consult  Pharmacy Consult for Warfarin  Indication: Hx VTE  Allergies  Allergen Reactions  . Aspirin Other (See Comments)    ulcers  . Celecoxib Other (See Comments)    Stomach irritation  . Ibuprofen Other (See Comments)    ulcers  . Lyrica [Pregabalin] Swelling  . Vicodin [Hydrocodone-Acetaminophen]     Makes me mean    Patient Measurements: Weight: 246 lb 14.4 oz (111.993 kg)  Vital Signs: Temp: 98.6 F (37 C) (02/06 2034) Temp Source: Oral (02/06 1612) BP: 183/101 mmHg (02/07 0000) Pulse Rate: 93 (02/07 0000)  Labs:  Recent Labs  10/16/15 1627  HGB 12.3*  HCT 37.6*  PLT 215  CREATININE 0.93    Estimated Creatinine Clearance: 108.5 mL/min (by C-G formula based on Cr of 0.93).   Medical History: Past Medical History  Diagnosis Date  . Hypertension   . History of blood clots   . Diverticulitis   . Hernia   . Diverticulosis   . Shingles   . Gout   . Back pain   . Neuropathy, peripheral ([place]) 05/15/2012  . DVT (deep venous thrombosis) ([place])   . Anxiety and depression   . Nausea and vomiting    Assessment: 65 y/o M here with dental pain/facial swelling. On warfarin PTA for hx VTE. INR hasn't been done yet.   PTA Warfarin dose per outpatient anti-coag notes from 10/09/15 is 10 mg Mon/Wed/Fri, 7.5 mg all other days  Goal of Therapy:  INR 2-3 Monitor platelets by anticoagulation protocol: Yes   Plan:  -Get INR with AM labs to assess dosing needs  Narda Bonds 10/17/2015,3:50 AM

## 2015-10-17 NOTE — H&P (Addendum)
[facility] History and Physical  [name] [name] [id number] DOB: 1951/07/26 DOA: 10/16/2015  Referring physician: EDP PCP: [name], MD   Chief Complaint: Facial swelling   HPI: [name] [name] is a 65 y.o. male who presents to the ED with c/o dental pain and swelling to the L side of his face.  Symptoms onset 3 days ago on L side of upper mouth.  Significantly worse today.  When brushing his teeth today he noticed that a left upper tooth (tooth 12) is missing, and the socket is painful, and is exuding a purulent material.  Review of Systems: Systems reviewed.  As above, otherwise negative  Past Medical History  Diagnosis Date  . Hypertension   . History of blood clots   . Diverticulitis   . Hernia   . Diverticulosis   . Shingles   . Gout   . Back pain   . Neuropathy, peripheral ([place]) 05/15/2012  . DVT (deep venous thrombosis) ([place])   . Anxiety and depression   . Nausea and vomiting    Past Surgical History  Procedure Laterality Date  . Back surgery    . Hernia repair    . Hiatal hernia repair    . Appendectomy    . Cholecystectomy  2008  . Sacral nerve stimulator placement  2011  . Left heart catheterization with coronary angiogram N/A 08/02/2014    Procedure: LEFT HEART CATHETERIZATION WITH CORONARY ANGIOGRAM;  Surgeon: Laverda Page, MD;  Location: [facility] CATH LAB;  Service: Cardiovascular;  Laterality: N/A;   Social History:  reports that he has been smoking Cigarettes.  He has a 20 pack-year smoking history. He has never used smokeless tobacco. He reports that he does not drink alcohol or use illicit drugs.  Allergies  Allergen Reactions  . Aspirin Other (See Comments)    ulcers  . Celecoxib Other (See Comments)    Stomach irritation  . Ibuprofen Other (See Comments)    ulcers  . Lyrica [Pregabalin] Swelling  . Vicodin [Hydrocodone-Acetaminophen]     Makes me mean     Family History  Problem Relation Age of Onset  . Prostate cancer Father   .  Heart disease Father   . Diabetes Mother   . Heart disease Mother   . Colon cancer Maternal Uncle     dx in his 51's  . Pancreatic cancer Paternal Uncle   . Prostate cancer Paternal Uncle   . Multiple sclerosis Daughter   . Prostate cancer Paternal Uncle      Prior to Admission medications   Medication Sig Start Date End Date Taking? Authorizing Provider  DULoxetine (CYMBALTA) 60 MG capsule Take 120 mg by mouth daily.    Yes Historical Provider, MD  glycopyrrolate (ROBINUL) 2 MG tablet Take 1 tablet (2 mg total) by mouth 2 (two) times daily. 04/10/15  Yes [name], MD  metoprolol tartrate (LOPRESSOR) 25 MG tablet Take 25 mg by mouth 2 (two) times daily. 07/29/14  Yes Historical Provider, MD  NIFEDICAL XL 60 MG 24 hr tablet Take 120 mg by mouth daily.  02/21/14  Yes Historical Provider, MD  oxyCODONE-acetaminophen (PERCOCET) 10-325 MG tablet Take 1 tablet by mouth every 4 (four) hours as needed for pain.   Yes Historical Provider, MD  PROAIR HFA 108 (90 BASE) MCG/ACT inhaler Inhale 1 puff into the lungs every 6 (six) hours as needed for wheezing.  03/23/14  Yes Historical Provider, MD  tiZANidine (ZANAFLEX) 4 MG tablet Take 4 mg by  mouth every 6 (six) hours as needed for muscle spasms.  07/27/14  Yes Historical Provider, MD  valsartan-hydrochlorothiazide (DIOVAN-HCT) 320-25 MG per tablet Take 1 tablet by mouth daily.   Yes Historical Provider, MD  VOLTAREN 1 % GEL Apply 2 g topically daily as needed. For neck and shoulder pain 02/23/14  Yes Historical Provider, MD  warfarin (COUMADIN) 5 MG tablet Take 7.5-10 mg by mouth daily. Take two of the 5mg  tablets (total of 10mg ) on mon and tuesdays fridays, take 7.5mg (Split 5mg  tab in half ) on wed and saturdays and Sunday per patient   Yes Historical Provider, MD  ondansetron (ZOFRAN) 4 MG tablet Take 1 tablet (4 mg total) by mouth every 8 (eight) hours as needed for nausea or vomiting. Patient not taking: Reported on 10/16/2015 03/17/15   [name], MD   Physical Exam: Filed Vitals:   10/16/15 2315 10/17/15 0000  BP: 165/101 183/101  Pulse: 96 93  Temp:    Resp:      BP 183/101 mmHg  Pulse 93  Temp(Src) 98.6 F (37 C) (Oral)  Resp 16  Wt 111.993 kg (246 lb 14.4 oz)  SpO2 100%  General Appearance:    Alert, oriented, no distress, appears stated age  Head:    Normocephalic, atraumatic  Eyes:    PERRL, EOMI, sclera non-icteric        Nose:   Nares without drainage or epistaxis. Mucosa, turbinates normal  Throat:   Moist mucous membranes. Tooth #12 is missing, empty socket with purulent exudate, surrounding erythema and edema.  Has left sided facial edema as well.  Neck:   Supple. No carotid bruits.  No thyromegaly.  No lymphadenopathy.   Back:     No CVA tenderness, no spinal tenderness  Lungs:     Clear to auscultation bilaterally, without wheezes, rhonchi or rales  Chest wall:    No tenderness to palpitation  Heart:    Regular rate and rhythm without murmurs, gallops, rubs  Abdomen:     Soft, non-tender, nondistended, normal bowel sounds, no organomegaly  Genitalia:    deferred  Rectal:    deferred  Extremities:   No clubbing, cyanosis or edema.  Pulses:   2+ and symmetric all extremities  Skin:   Skin color, texture, turgor normal, no rashes or lesions  Lymph nodes:   Cervical, supraclavicular, and axillary nodes normal  Neurologic:   CNII-XII intact. Normal strength, sensation and reflexes      throughout    Labs on Admission:  Basic Metabolic Panel:  Recent Labs Lab 10/16/15 1627  NA 135  K 4.0  CL 95*  CO2 28  GLUCOSE 90  BUN 6  CREATININE 0.93  CALCIUM 9.1   Liver Function Tests:  Recent Labs Lab 10/16/15 1627  AST 19  ALT 14*  ALKPHOS 42  BILITOT 0.6  PROT 6.8  ALBUMIN 3.6   No results for input(s): LIPASE, AMYLASE in the last 168 hours. No results for input(s): AMMONIA in the last 168 hours. CBC:  Recent Labs Lab 10/16/15 1627  WBC 6.0  NEUTROABS 3.7  HGB 12.3*  HCT  37.6*  MCV 70.1*  PLT 215   Cardiac Enzymes: No results for input(s): CKTOTAL, CKMB, CKMBINDEX, TROPONINI in the last 168 hours.  BNP (last 3 results) No results for input(s): PROBNP in the last 8760 hours. CBG: No results for input(s): GLUCAP in the last 168 hours.  Radiological Exams on Admission: Ct Maxillofacial W/cm  10/17/2015  ADDENDUM  REPORT: 10/17/2015 02:33 ADDENDUM: Additional reformations submitted with larger field of view. Re- demonstration of LEFT facial soft tissue swelling extending to the nasal labial old, no abscess on these additional images. Electronically Signed   By: Elon Alas M.D.   On: 10/17/2015 02:33  10/17/2015  CLINICAL DATA:  LEFT upper tooth pain, LEFT facial swelling with spontaneous decompression. EXAM: CT MAXILLOFACIAL WITH CONTRAST TECHNIQUE: Multidetector CT imaging of the maxillofacial structures was performed with intravenous contrast. Multiplanar CT image reconstructions were also generated. A small metallic BB was placed on the right temple in order to reliably differentiate right from left. The patient was rescanned, as initial examination did not include lower face. CONTRAST:  96mL OMNIPAQUE IOHEXOL 300 MG/ML SOLN, 23mL OMNIPAQUE IOHEXOL 300 MG/ML SOLN, COMPARISON:  None. FINDINGS: Multiple bilateral dental caries with tooth 1 periapical abscess. Early suspected multiple additional periapical abscess. No facial fracture. No suspicious bony lesions. RIGHT maxillary mucosal retention cyst, mild paranasal sinus mucosal thickening without air-fluid levels. Nasal septum is midline. Bilateral concha bullosa. Ocular globes and orbital contents are normal. Included intracranial contents are normal. The included LEFT facial subcutaneous fat demonstrates stranding, mildly thickened LEFT platysma. No focal fluid collections, subcutaneous gas or radiopaque foreign bodies. IMPRESSION: LEFT facial cellulitis without drainable fluid collection; findings may be  odontogenic as there multiple dental caries. Tooth 1 periapical lucency/abscess. Electronically Signed: By: Elon Alas M.D. On: 10/17/2015 02:05    EKG: Independently reviewed.  Assessment/Plan Active Problems:   Dental abscess   Facial cellulitis   1. Dental abscess and facial cellulitis - 1. Clindamycin 2. Call Dentist in AM    Code Status: Full  Family Communication: Family at bedside Disposition Plan: Admit to obs  Time spent: 30 min  Mariann Palo M. [facility] Pager [phone]  If 7AM-7PM, please contact the day team taking care of the patient [email or web address] Password [facility] 10/17/2015, 3:44 AM

## 2015-10-18 DIAGNOSIS — K047 Periapical abscess without sinus: Principal | ICD-10-CM

## 2015-10-18 DIAGNOSIS — L03211 Cellulitis of face: Secondary | ICD-10-CM

## 2015-10-18 LAB — CBC
HCT: 34.2 % — ABNORMAL LOW (ref 39.0–52.0)
Hemoglobin: 11.4 g/dL — ABNORMAL LOW (ref 13.0–17.0)
MCH: 23.4 pg — ABNORMAL LOW (ref 26.0–34.0)
MCHC: 33.3 g/dL (ref 30.0–36.0)
MCV: 70.2 fL — ABNORMAL LOW (ref 78.0–100.0)
Platelets: 239 10*3/uL (ref 150–400)
RBC: 4.87 MIL/uL (ref 4.22–5.81)
RDW: 15.8 % — ABNORMAL HIGH (ref 11.5–15.5)
WBC: 2.8 10*3/uL — ABNORMAL LOW (ref 4.0–10.5)

## 2015-10-18 LAB — HEPARIN LEVEL (UNFRACTIONATED)
Heparin Unfractionated: 0.34 IU/mL (ref 0.30–0.70)
Heparin Unfractionated: 0.41 IU/mL (ref 0.30–0.70)

## 2015-10-18 LAB — PROTIME-INR
INR: 1.67 — ABNORMAL HIGH (ref 0.00–1.49)
Prothrombin Time: 19.7 seconds — ABNORMAL HIGH (ref 11.6–15.2)

## 2015-10-18 MED ORDER — CEFAZOLIN SODIUM-DEXTROSE 2-3 GM-% IV SOLR
2.0000 g | Freq: Once | INTRAVENOUS | Status: AC
Start: 2015-10-19 — End: 2015-10-19
  Administered 2015-10-19: 2 g via INTRAVENOUS
  Filled 2015-10-18: qty 50

## 2015-10-18 MED ORDER — OXYCODONE-ACETAMINOPHEN 5-325 MG PO TABS
2.0000 | ORAL_TABLET | ORAL | Status: DC | PRN
  Administered 2015-10-18 (×2): 2 via ORAL
  Administered 2015-10-18: 1 via ORAL
  Administered 2015-10-19 – 2015-10-20 (×5): 2 via ORAL
  Filled 2015-10-18 (×8): qty 2

## 2015-10-18 MED ORDER — HEPARIN (PORCINE) IN NACL 100-0.45 UNIT/ML-% IJ SOLN
1600.0000 [IU]/h | INTRAMUSCULAR | Status: DC
  Administered 2015-10-18: 1600 [IU]/h via INTRAVENOUS
  Filled 2015-10-18 (×2): qty 250

## 2015-10-18 MED ORDER — OXYCODONE-ACETAMINOPHEN 5-325 MG PO TABS: 1.0000 | ORAL_TABLET | ORAL | Status: DC | PRN

## 2015-10-18 NOTE — Progress Notes (Signed)
[place] for heparin bridge therapy Indication: Hx VTE  Allergies  Allergen Reactions  . Aspirin Other (See Comments)    ulcers  . Celecoxib Other (See Comments)    Stomach irritation  . Ibuprofen Other (See Comments)    ulcers  . Lyrica [Pregabalin] Swelling  . Vicodin [Hydrocodone-Acetaminophen]     Makes me mean    Patient Measurements: Height: 6\' 4"  (193 cm) Weight: 223 lb 12.3 oz (101.5 kg) IBW/kg (Calculated) : 86.8  Vital Signs: Temp: 98.7 F (37.1 C) (02/08 0602) Temp Source: Oral (02/08 0602) BP: 126/101 mmHg (02/08 1029) Pulse Rate: 102 (02/08 1029)  Labs:  Recent Labs  10/16/15 1627 10/17/15 0457 10/17/15 2150 10/18/15 0551 10/18/15 1138  HGB 12.3*  --   --  11.4*  --   HCT 37.6*  --   --  34.2*  --   PLT 215  --   --  239  --   LABPROT  --  20.1*  --  19.7*  --   INR  --  1.72*  --  1.67*  --   HEPARINUNFRC  --   --  <0.10* 0.34 0.41  CREATININE 0.93  --   --   --   --     Estimated Creatinine Clearance: 97.2 mL/min (by C-G formula based on Cr of 0.93).   Assessment: 65 y/o M here with dental pain/facial swelling. On warfarin PTA for hx VTE.  PTA Warfarin dose per outpatient anti-coag notes from 10/09/15 is 10 mg Mon/Wed/Fri, 7.5 mg all other days. Pharmacy dosing heparin for bridge therapy while coumadin on hold pre-op. Scheduled to have multiple teeth extraction w/ alveoloplasty Thursday 2/9  HL therapeutic at  0.34 and 0.41 on 1600 units/hr, INR 1.67, CBC stable with no bleeding reported.  OR scheduled for 2/9 at 0715 Hold heparin therapy starting on 10/19/2015 at 1:30 AM.  Do not restart until 12 hours after the dental extraction procedures with NO BOLUS. Per Dr. Enrique Sack  Goal of Therapy:  HL 0.3 - 0.7  Monitor platelets by anticoagulation protocol: Yes   Plan: cont hep 1600 units/hr, I dc'd HL for 2/9 am Hold heparin therapy starting on 10/19/2015 at 1:30 AM.   OR sched for 0715 am Do not restart  until 12 hours after the dental extraction procedures with NO BOLUS. Per Dr. Enrique Sack-  F/u post-op 2/9 to resume hep/coumadin  Eudelia Bunch, Pharm.D. QP:[id number] 10/18/2015 1:45 PM

## 2015-10-18 NOTE — Progress Notes (Signed)
ANTICOAGULATION CONSULT NOTE - Follow Up Consult  Pharmacy Consult for heparin Indication: h/o VTE   Labs:  Recent Labs  10/16/15 1627 10/17/15 0457 10/17/15 2150 10/18/15 0551  HGB 12.3*  --   --  11.4*  HCT 37.6*  --   --  34.2*  PLT 215  --   --  239  LABPROT  --  20.1*  --  19.7*  INR  --  1.72*  --  1.67*  HEPARINUNFRC  --   --  <0.10* 0.34  CREATININE 0.93  --   --   --      Assessment/Plan:  65yo male therapeutic on heparin after gtt resumed after off for unknown duration. Will continue gtt at current rate and confirm stable with additional level.   Wynona Neat, PharmD, BCPS  10/18/2015,7:03 AM

## 2015-10-18 NOTE — Progress Notes (Signed)
Patient Demographics:    [name] [name], is a 65 y.o. male, DOB - September 01, 1951, [id number]  Admit date - 10/16/2015   Admitting Physician Etta Quill, DO  Outpatient Primary MD for the patient is [name], MD  LOS - 1   Chief Complaint  Patient presents with  . Dental Pain        Subjective:    [name] [name] today has, No headache, No chest pain, No abdominal pain - No Nausea, No new weakness tingling or numbness, No Cough - SOB. L facial pain.    Assessment  & Plan :     1.L Facial cellulitis and dental abscess - on IV ABX, Dentist called, due for dental surgery and extraction on 10/19/2015.  2. DVT multilpe - hold coumadin, Hep gtt  3.HTN - Lopressor + PRN hydralazine  4.Smoking - counseled, N.patch.    Code Status : Full  Family Communication  : none  Disposition Plan  : TBD  Consults  :  Dentist  Procedures  :   CT face and Orthopantogram - Dental abscess and facial cellulits  DVT Prophylaxis  :   Heparin   Lab Results  Component Value Date   PLT 239 10/18/2015    Inpatient Medications  Scheduled Meds: . clindamycin (CLEOCIN) IV  600 mg Intravenous 3 times per day  . DULoxetine  120 mg Oral Daily  . glycopyrrolate  2 mg Oral BID  . Influenza vac split quadrivalent PF  0.5 mL Intramuscular Tomorrow-1000  . metoprolol tartrate  25 mg Oral BID  . nicotine  21 mg Transdermal Daily  . NIFEdipine  120 mg Oral Daily  . pneumococcal 23 valent vaccine  0.5 mL Intramuscular Tomorrow-1000   Continuous Infusions: . heparin 1,600 Units/hr (10/17/15 1503)   PRN Meds:.albuterol, diclofenac sodium, hydrALAZINE, morphine injection, ondansetron, oxyCODONE-acetaminophen **AND** [DISCONTINUED] oxyCODONE, tiZANidine  Antibiotics  :    Anti-infectives    Start     Dose/Rate  Route Frequency Ordered Stop   10/17/15 0600  clindamycin (CLEOCIN) IVPB 600 mg     600 mg 100 mL/hr over 30 Minutes Intravenous 3 times per day 10/17/15 0253     10/16/15 2300  clindamycin (CLEOCIN) IVPB 600 mg     600 mg 100 mL/hr over 30 Minutes Intravenous  Once 10/16/15 2245 10/17/15 0218        Objective:   Filed Vitals:   10/17/15 0426 10/17/15 1341 10/17/15 2150 10/18/15 0602  BP: 166/99 162/87 132/74 133/80  Pulse: 87 84 81 77  Temp: 98.5 F (36.9 C) 98.2 F (36.8 C) 98.5 F (36.9 C) 98.7 F (37.1 C)  TempSrc: Oral Oral Oral Oral  Resp: 18 18 18 18   Height: 6\' 4"  (1.93 m)     Weight: 101.5 kg (223 lb 12.3 oz)     SpO2: 99% 98% 100% 95%    Wt Readings from Last 3 Encounters:  10/17/15 101.5 kg (223 lb 12.3 oz)  08/04/15 115.667 kg (255 lb)  05/09/15 110.859 kg (244 lb 6.4 oz)     Intake/Output Summary (Last 24 hours) at 10/18/15 1000 Last data filed at 10/18/15 0602  Gross per 24 hour  Intake 744.93 ml  Output    800 ml  Net -55.07 ml  Physical Exam  Awake Alert, Oriented X 3, No new F.N deficits, Normal affect .AT,PERRAL, L upper maxilla large dental abscess with L facial swelling Supple Neck,No JVD, No cervical lymphadenopathy appriciated.  Symmetrical Chest wall movement, Good air movement bilaterally, CTAB RRR,No Gallops,Rubs or new Murmurs, No Parasternal Heave +ve B.Sounds, Abd Soft, No tenderness, No organomegaly appriciated, No rebound - guarding or rigidity. No Cyanosis, Clubbing or edema, No new Rash or bruise       Data Review:   Micro Results No results found for this or any previous visit (from the past 240 hour(s)).  Radiology Reports Dg Orthopantogram  10/17/2015  CLINICAL DATA:  Dental abscess, left upper. Two days of pain and swelling. EXAM: ORTHOPANTOGRAM/PANORAMIC COMPARISON:  10/17/2015 FINDINGS: The dental cavities shown on today's CT scan are somewhat less conspicuous on today's Panorex. Tooth # 1 is absent. Tooth # 2 is  severely decayed with most of the tooth (particularly the crown) missing ; this is the tooth with the periapical lucency on prior CT scan. This was referred to as tooth # 1 on CT but is thought actually be tooth # 2. The very large cavity of tooth # 12 is observed, with absent crown. Smaller cavities such as the posterior-labial cavity of tooth # 16 are visible but less conspicuous compared to the CT. IMPRESSION: 1. Very large cavities of teeth # 2 and # 12. The periapical lucency referred to on the prior CT is associated with the roots of tooth # 2. Tooth # 1 is absent. 2. Dental cavities. Electronically Signed   By: Van Clines M.D.   On: 10/17/2015 09:15   Ct Maxillofacial W/cm  10/17/2015  ADDENDUM REPORT: 10/17/2015 02:33 ADDENDUM: Additional reformations submitted with larger field of view. Re- demonstration of LEFT facial soft tissue swelling extending to the nasal labial old, no abscess on these additional images. Electronically Signed   By: Elon Alas M.D.   On: 10/17/2015 02:33  10/17/2015  CLINICAL DATA:  LEFT upper tooth pain, LEFT facial swelling with spontaneous decompression. EXAM: CT MAXILLOFACIAL WITH CONTRAST TECHNIQUE: Multidetector CT imaging of the maxillofacial structures was performed with intravenous contrast. Multiplanar CT image reconstructions were also generated. A small metallic BB was placed on the right temple in order to reliably differentiate right from left. The patient was rescanned, as initial examination did not include lower face. CONTRAST:  58mL OMNIPAQUE IOHEXOL 300 MG/ML SOLN, 75mL OMNIPAQUE IOHEXOL 300 MG/ML SOLN, COMPARISON:  None. FINDINGS: Multiple bilateral dental caries with tooth 1 periapical abscess. Early suspected multiple additional periapical abscess. No facial fracture. No suspicious bony lesions. RIGHT maxillary mucosal retention cyst, mild paranasal sinus mucosal thickening without air-fluid levels. Nasal septum is midline. Bilateral concha  bullosa. Ocular globes and orbital contents are normal. Included intracranial contents are normal. The included LEFT facial subcutaneous fat demonstrates stranding, mildly thickened LEFT platysma. No focal fluid collections, subcutaneous gas or radiopaque foreign bodies. IMPRESSION: LEFT facial cellulitis without drainable fluid collection; findings may be odontogenic as there multiple dental caries. Tooth 1 periapical lucency/abscess. Electronically Signed: By: Elon Alas M.D. On: 10/17/2015 02:05     CBC  Recent Labs Lab 10/16/15 1627 10/18/15 0551  WBC 6.0 2.8*  HGB 12.3* 11.4*  HCT 37.6* 34.2*  PLT 215 239  MCV 70.1* 70.2*  MCH 22.9* 23.4*  MCHC 32.7 33.3  RDW 15.7* 15.8*  LYMPHSABS 1.4  --   MONOABS 0.9  --   EOSABS 0.0  --   BASOSABS 0.0  --  Chemistries   Recent Labs Lab 10/16/15 1627  NA 135  K 4.0  CL 95*  CO2 28  GLUCOSE 90  BUN 6  CREATININE 0.93  CALCIUM 9.1  AST 19  ALT 14*  ALKPHOS 42  BILITOT 0.6   ------------------------------------------------------------------------------------------------------------------ No results for input(s): CHOL, HDL, LDLCALC, TRIG, CHOLHDL, LDLDIRECT in the last 72 hours.  No results found for: HGBA1C ------------------------------------------------------------------------------------------------------------------ No results for input(s): TSH, T4TOTAL, T3FREE, THYROIDAB in the last 72 hours.  Invalid input(s): FREET3 ------------------------------------------------------------------------------------------------------------------ No results for input(s): VITAMINB12, FOLATE, FERRITIN, TIBC, IRON, RETICCTPCT in the last 72 hours.  Coagulation profile  Recent Labs Lab 10/17/15 0457 10/18/15 0551  INR 1.72* 1.67*    No results for input(s): DDIMER in the last 72 hours.  Cardiac Enzymes No results for input(s): CKMB, TROPONINI, MYOGLOBIN in the last 168 hours.  Invalid input(s):  CK ------------------------------------------------------------------------------------------------------------------ No results found for: BNP  Time Spent in minutes   35   SINGH,PRASHANT K M.D on 10/18/2015 at 10:00 AM  Between 7am to 7pm - Pager - [phone]  After 7pm go to [email or web address] - password [facility]  [facility] -  Office  [phone]

## 2015-10-19 ENCOUNTER — Encounter (HOSPITAL_COMMUNITY): Payer: Self-pay | Admitting: Critical Care Medicine

## 2015-10-19 ENCOUNTER — Inpatient Hospital Stay (HOSPITAL_COMMUNITY): Payer: Medicare Other | Admitting: Critical Care Medicine

## 2015-10-19 ENCOUNTER — Encounter (HOSPITAL_COMMUNITY)
Admission: EM | Disposition: A | Discharge status: Home / Self Care | Payer: Self-pay | Source: Home / Self Care | Attending: Internal Medicine

## 2015-10-19 DIAGNOSIS — K047 Periapical abscess without sinus: Secondary | ICD-10-CM

## 2015-10-19 DIAGNOSIS — K083 Retained dental root: Secondary | ICD-10-CM

## 2015-10-19 DIAGNOSIS — K029 Dental caries, unspecified: Secondary | ICD-10-CM

## 2015-10-19 DIAGNOSIS — K045 Chronic apical periodontitis: Secondary | ICD-10-CM

## 2015-10-19 DIAGNOSIS — K0401 Reversible pulpitis: Secondary | ICD-10-CM

## 2015-10-19 HISTORY — PX: MULTIPLE EXTRACTIONS WITH ALVEOLOPLASTY: SHX5342

## 2015-10-19 LAB — CBC
HCT: 37.4 % — ABNORMAL LOW (ref 39.0–52.0)
Hemoglobin: 12.4 g/dL — ABNORMAL LOW (ref 13.0–17.0)
MCH: 23.2 pg — ABNORMAL LOW (ref 26.0–34.0)
MCHC: 33.2 g/dL (ref 30.0–36.0)
MCV: 70 fL — ABNORMAL LOW (ref 78.0–100.0)
Platelets: 288 10*3/uL (ref 150–400)
RBC: 5.34 MIL/uL (ref 4.22–5.81)
RDW: 15.8 % — ABNORMAL HIGH (ref 11.5–15.5)
WBC: 5.7 10*3/uL (ref 4.0–10.5)

## 2015-10-19 LAB — PROTIME-INR
INR: 1.24 (ref 0.00–1.49)
Prothrombin Time: 15.7 seconds — ABNORMAL HIGH (ref 11.6–15.2)

## 2015-10-19 LAB — SURGICAL PCR SCREEN
MRSA, PCR: NEGATIVE
Staphylococcus aureus: NEGATIVE

## 2015-10-19 SURGERY — MULTIPLE EXTRACTION WITH ALVEOLOPLASTY
Anesthesia: General

## 2015-10-19 MED ORDER — 0.9 % SODIUM CHLORIDE (POUR BTL) OPTIME
TOPICAL | Status: DC | PRN
  Administered 2015-10-19: 1000 mL

## 2015-10-19 MED ORDER — FENTANYL CITRATE (PF) 100 MCG/2ML IJ SOLN
INTRAMUSCULAR | Status: AC
  Filled 2015-10-19: qty 2

## 2015-10-19 MED ORDER — BUPIVACAINE-EPINEPHRINE 0.5% -1:200000 IJ SOLN
INTRAMUSCULAR | Status: DC | PRN
  Administered 2015-10-19: 1.8 mL

## 2015-10-19 MED ORDER — OXYMETAZOLINE HCL 0.05 % NA SOLN
NASAL | Status: AC
  Filled 2015-10-19: qty 15

## 2015-10-19 MED ORDER — PHENYLEPHRINE HCL 10 MG/ML IJ SOLN
INTRAMUSCULAR | Status: DC | PRN
  Administered 2015-10-19: 80 ug via INTRAVENOUS
  Administered 2015-10-19: 120 ug via INTRAVENOUS

## 2015-10-19 MED ORDER — ENOXAPARIN SODIUM 100 MG/ML ~~LOC~~ SOLN
1.0000 mg/kg | Freq: Two times a day (BID) | SUBCUTANEOUS | Status: DC
  Administered 2015-10-20: 100 mg via SUBCUTANEOUS
  Filled 2015-10-19: qty 1

## 2015-10-19 MED ORDER — OXYCODONE-ACETAMINOPHEN 5-325 MG PO TABS
ORAL_TABLET | ORAL | Status: AC
  Administered 2015-10-19: 2
  Filled 2015-10-19: qty 2

## 2015-10-19 MED ORDER — ROCURONIUM BROMIDE 100 MG/10ML IV SOLN
INTRAVENOUS | Status: DC | PRN
  Administered 2015-10-19: 30 mg via INTRAVENOUS

## 2015-10-19 MED ORDER — BUPIVACAINE-EPINEPHRINE (PF) 0.5% -1:200000 IJ SOLN
INTRAMUSCULAR | Status: AC
  Filled 2015-10-19: qty 3.6

## 2015-10-19 MED ORDER — NEOSTIGMINE METHYLSULFATE 10 MG/10ML IV SOLN
INTRAVENOUS | Status: DC | PRN
  Administered 2015-10-19: 4 mg via INTRAVENOUS

## 2015-10-19 MED ORDER — FENTANYL CITRATE (PF) 100 MCG/2ML IJ SOLN
INTRAMUSCULAR | Status: DC | PRN
  Administered 2015-10-19 (×2): 50 ug via INTRAVENOUS
  Administered 2015-10-19: 100 ug via INTRAVENOUS

## 2015-10-19 MED ORDER — AMINOCAPROIC ACID SOLUTION 5% (50 MG/ML)
10.0000 mL | ORAL | Status: DC
  Administered 2015-10-19: 100 mL via ORAL
  Administered 2015-10-19 (×3): 10 mL via ORAL
  Filled 2015-10-19: qty 100

## 2015-10-19 MED ORDER — ONDANSETRON HCL 4 MG/2ML IJ SOLN
INTRAMUSCULAR | Status: DC | PRN
  Administered 2015-10-19: 4 mg via INTRAVENOUS

## 2015-10-19 MED ORDER — STERILE WATER FOR IRRIGATION IR SOLN
Status: DC | PRN
  Administered 2015-10-19: 600 mL

## 2015-10-19 MED ORDER — LACTATED RINGERS IV SOLN
INTRAVENOUS | Status: DC
  Administered 2015-10-19: 09:00:00 via INTRAVENOUS

## 2015-10-19 MED ORDER — ATROPINE SULFATE 0.1 MG/ML IJ SOLN
INTRAMUSCULAR | Status: AC
  Filled 2015-10-19: qty 10

## 2015-10-19 MED ORDER — LACTATED RINGERS IV SOLN
INTRAVENOUS | Status: DC | PRN
  Administered 2015-10-19 (×2): via INTRAVENOUS

## 2015-10-19 MED ORDER — FENTANYL CITRATE (PF) 100 MCG/2ML IJ SOLN
25.0000 ug | INTRAMUSCULAR | Status: DC | PRN
  Administered 2015-10-19 (×2): 50 ug via INTRAVENOUS
  Administered 2015-10-19 (×2): 25 ug via INTRAVENOUS

## 2015-10-19 MED ORDER — MIDAZOLAM HCL 2 MG/2ML IJ SOLN
INTRAMUSCULAR | Status: AC
  Filled 2015-10-19: qty 2

## 2015-10-19 MED ORDER — HEMOSTATIC AGENTS (NO CHARGE) OPTIME
TOPICAL | Status: DC | PRN
  Administered 2015-10-19: 1 via TOPICAL

## 2015-10-19 MED ORDER — PROPOFOL 10 MG/ML IV BOLUS
INTRAVENOUS | Status: AC
  Filled 2015-10-19: qty 20

## 2015-10-19 MED ORDER — CEFAZOLIN SODIUM-DEXTROSE 2-3 GM-% IV SOLR
INTRAVENOUS | Status: AC
  Administered 2015-10-19: 2 g via INTRAVENOUS
  Filled 2015-10-19: qty 50

## 2015-10-19 MED ORDER — SUCCINYLCHOLINE CHLORIDE 20 MG/ML IJ SOLN
INTRAMUSCULAR | Status: AC
  Filled 2015-10-19: qty 1

## 2015-10-19 MED ORDER — PROPOFOL 10 MG/ML IV BOLUS
INTRAVENOUS | Status: DC | PRN
  Administered 2015-10-19 (×2): 50 mg via INTRAVENOUS
  Administered 2015-10-19: 150 mg via INTRAVENOUS
  Administered 2015-10-19: 50 mg via INTRAVENOUS

## 2015-10-19 MED ORDER — ROCURONIUM BROMIDE 50 MG/5ML IV SOLN
INTRAVENOUS | Status: AC
  Filled 2015-10-19: qty 1

## 2015-10-19 MED ORDER — SODIUM CHLORIDE 0.9 % IJ SOLN
INTRAMUSCULAR | Status: AC
  Filled 2015-10-19: qty 10

## 2015-10-19 MED ORDER — FENTANYL CITRATE (PF) 250 MCG/5ML IJ SOLN
INTRAMUSCULAR | Status: AC
  Filled 2015-10-19: qty 5

## 2015-10-19 MED ORDER — DEXAMETHASONE SODIUM PHOSPHATE 10 MG/ML IJ SOLN
INTRAMUSCULAR | Status: AC
  Filled 2015-10-19: qty 1

## 2015-10-19 MED ORDER — PROMETHAZINE HCL 25 MG/ML IJ SOLN: 6.2500 mg | INTRAMUSCULAR | Status: DC | PRN

## 2015-10-19 MED ORDER — LIDOCAINE HCL (CARDIAC) 20 MG/ML IV SOLN
INTRAVENOUS | Status: DC | PRN
  Administered 2015-10-19: 100 mg via INTRAVENOUS

## 2015-10-19 MED ORDER — LIDOCAINE-EPINEPHRINE 2 %-1:100000 IJ SOLN
INTRAMUSCULAR | Status: DC | PRN
  Administered 2015-10-19 (×5): 1.7 mL via INTRADERMAL

## 2015-10-19 MED ORDER — LIDOCAINE HCL (CARDIAC) 20 MG/ML IV SOLN
INTRAVENOUS | Status: AC
  Filled 2015-10-19: qty 5

## 2015-10-19 MED ORDER — EPHEDRINE SULFATE 50 MG/ML IJ SOLN
INTRAMUSCULAR | Status: AC
  Filled 2015-10-19: qty 1

## 2015-10-19 MED ORDER — GLYCOPYRROLATE 0.2 MG/ML IJ SOLN
INTRAMUSCULAR | Status: DC | PRN
  Administered 2015-10-19: 0.6 mg via INTRAVENOUS

## 2015-10-19 MED ORDER — LIDOCAINE-EPINEPHRINE 2 %-1:100000 IJ SOLN
INTRAMUSCULAR | Status: AC
  Filled 2015-10-19: qty 10.2

## 2015-10-19 MED ORDER — DEXTROSE 5 % IV SOLN
10.0000 mg | INTRAVENOUS | Status: DC | PRN
  Administered 2015-10-19: 20 ug/min via INTRAVENOUS

## 2015-10-19 MED ORDER — HEPARIN (PORCINE) IN NACL 100-0.45 UNIT/ML-% IJ SOLN
1600.0000 [IU]/h | INTRAMUSCULAR | Status: DC
  Filled 2015-10-19: qty 250

## 2015-10-19 MED ORDER — ISOPROPYL ALCOHOL 70 % SOLN
Status: DC | PRN
  Administered 2015-10-19: 1 via TOPICAL

## 2015-10-19 MED ORDER — MIDAZOLAM HCL 5 MG/5ML IJ SOLN
INTRAMUSCULAR | Status: DC | PRN
  Administered 2015-10-19: 2 mg via INTRAVENOUS

## 2015-10-19 MED ORDER — WARFARIN - PHARMACIST DOSING INPATIENT
Freq: Every day | Status: DC
  Administered 2015-10-19: 18:00:00

## 2015-10-19 MED ORDER — WARFARIN SODIUM 5 MG PO TABS
10.0000 mg | ORAL_TABLET | Freq: Once | ORAL | Status: AC
  Administered 2015-10-19: 10 mg via ORAL
  Filled 2015-10-19: qty 2

## 2015-10-19 MED ORDER — EPINEPHRINE HCL 0.1 MG/ML IJ SOSY
PREFILLED_SYRINGE | INTRAMUSCULAR | Status: AC
  Filled 2015-10-19: qty 10

## 2015-10-19 SURGICAL SUPPLY — 35 items
ALCOHOL 70% 16 OZ (MISCELLANEOUS) ×2 IMPLANT
ATTRACTOMAT 16X20 MAGNETIC DRP (DRAPES) ×2 IMPLANT
BLADE SURG 15 STRL LF DISP TIS (BLADE) ×2 IMPLANT
BLADE SURG 15 STRL SS (BLADE) ×2
COVER SURGICAL LIGHT HANDLE (MISCELLANEOUS) ×2 IMPLANT
GAUZE PACKING FOLDED 2  STR (GAUZE/BANDAGES/DRESSINGS) ×1
GAUZE PACKING FOLDED 2 STR (GAUZE/BANDAGES/DRESSINGS) ×1 IMPLANT
GAUZE SPONGE 4X4 16PLY XRAY LF (GAUZE/BANDAGES/DRESSINGS) ×2 IMPLANT
GLOVE BIOGEL PI IND STRL 6 (GLOVE) ×1 IMPLANT
GLOVE BIOGEL PI INDICATOR 6 (GLOVE) ×1
GLOVE SURG ORTHO 8.0 STRL STRW (GLOVE) ×2 IMPLANT
GLOVE SURG SS PI 6.0 STRL IVOR (GLOVE) ×2 IMPLANT
GOWN STRL REUS W/ TWL LRG LVL3 (GOWN DISPOSABLE) ×1 IMPLANT
GOWN STRL REUS W/TWL 2XL LVL3 (GOWN DISPOSABLE) ×2 IMPLANT
GOWN STRL REUS W/TWL LRG LVL3 (GOWN DISPOSABLE) ×1
HEMOSTAT SURGICEL 2X14 (HEMOSTASIS) ×2 IMPLANT
KIT BASIN OR (CUSTOM PROCEDURE TRAY) ×2 IMPLANT
KIT ROOM TURNOVER OR (KITS) ×2 IMPLANT
MANIFOLD NEPTUNE WASTE (CANNULA) ×2 IMPLANT
NEEDLE BLUNT 16X1.5 OR ONLY (NEEDLE) ×2 IMPLANT
NS IRRIG 1000ML POUR BTL (IV SOLUTION) ×2 IMPLANT
PACK EENT II TURBAN DRAPE (CUSTOM PROCEDURE TRAY) ×2 IMPLANT
PAD ARMBOARD 7.5X6 YLW CONV (MISCELLANEOUS) ×2 IMPLANT
SPONGE SURGIFOAM ABS GEL 100 (HEMOSTASIS) IMPLANT
SPONGE SURGIFOAM ABS GEL 12-7 (HEMOSTASIS) IMPLANT
SPONGE SURGIFOAM ABS GEL SZ50 (HEMOSTASIS) IMPLANT
SUCTION FRAZIER HANDLE 10FR (MISCELLANEOUS) ×1
SUCTION TUBE FRAZIER 10FR DISP (MISCELLANEOUS) ×1 IMPLANT
SUT CHROMIC 3 0 PS 2 (SUTURE) ×4 IMPLANT
SUT CHROMIC 4 0 P 3 18 (SUTURE) IMPLANT
SYR 50ML SLIP (SYRINGE) ×2 IMPLANT
TOWEL NATURAL 6PK STERILE (DISPOSABLE) ×2 IMPLANT
TOWEL OR 17X26 10 PK STRL BLUE (TOWEL DISPOSABLE) IMPLANT
TUBE CONNECTING 12X1/4 (SUCTIONS) ×2 IMPLANT
YANKAUER SUCT BULB TIP NO VENT (SUCTIONS) ×2 IMPLANT

## 2015-10-19 NOTE — Transfer of Care (Signed)
Immediate Anesthesia Transfer of Care Note  Patient: [name] [name]  Procedure(s) Performed: Procedure(s): Extraction of tooth #'s 2,12,13 with alveoloplasty (N/A)  Patient Location: PACU  Anesthesia Type:General  Level of Consciousness: awake, alert  and oriented  Airway & Oxygen Therapy: Patient Spontanous Breathing and Patient connected to face mask oxygen  Post-op Assessment: Report given to RN, Post -op Vital signs reviewed and stable and Patient moving all extremities X 4  Post vital signs: Reviewed and stable  Last Vitals:  Filed Vitals:   10/18/15 2336 10/19/15 0533  BP: 133/69 135/82  Pulse: 76 72  Temp: 36.8 C 36.9 C  Resp: 17 18   HR 92, RR 16, Sats 123XX123, BP 123XX123 Complications: No apparent anesthesia complications

## 2015-10-19 NOTE — Anesthesia Postprocedure Evaluation (Signed)
Anesthesia Post Note  Patient: [name] [name]  Procedure(s) Performed: Procedure(s) (LRB): Extraction of tooth #'s 2,12,13 with alveoloplasty (N/A)  Patient location during evaluation: PACU Anesthesia Type: General Level of consciousness: awake Pain management: pain level controlled Vital Signs Assessment: post-procedure vital signs reviewed and stable Respiratory status: spontaneous breathing Cardiovascular status: stable Anesthetic complications: no    Last Vitals:  Filed Vitals:   10/18/15 2336 10/19/15 0533  BP: 133/69 135/82  Pulse: 76 72  Temp: 36.8 C 36.9 C  Resp: 17 18    Last Pain:  Filed Vitals:   10/19/15 0603  PainSc: 3                  EDWARDS,Lashea Goda

## 2015-10-19 NOTE — Progress Notes (Signed)
Patient Demographics:    [name] [name], is a 65 y.o. male, DOB - 02-Mar-1951, [id number]  Admit date - 10/16/2015   Admitting Physician Etta Quill, DO  Outpatient Primary MD for the patient is [name], MD  LOS - 2   Chief Complaint  Patient presents with  . Dental Pain        Subjective:    [name] [name] today has, No headache, No chest pain, No abdominal pain - No Nausea, No new weakness tingling or numbness, No Cough - SOB. improved L facial pain.    Assessment  & Plan :     1.L Facial cellulitis and dental abscess - continue on IV ABX, Dentist called, post dental surgery and extraction on 10/19/2015 -  Multiple extraction of tooth numbers 2, 12, and 13 &  2 Quadrants of alveoloplasty.   2. DVT multilpe - hold coumadin, Hep gtt, will start Coumadin today and change Hep to lovenox in the am.  3.HTN - Lopressor + PRN hydralazine  4.Smoking - counseled, N.patch.    Code Status : Full  Family Communication  : none  Disposition Plan  : TBD  Consults  :  Dentist  Procedures  :   CT face and Orthopantogram - Dental abscess and facial cellulits  On 10-19-15 underwent  1. Multiple extraction of tooth numbers 2, 12, and 13   2. 2 Quadrants of alveoloplasty   DVT Prophylaxis  :   Heparin   Lab Results  Component Value Date   PLT 288 10/19/2015    Inpatient Medications  Scheduled Meds: . clindamycin (CLEOCIN) IV  600 mg Intravenous 3 times per day  . DULoxetine  120 mg Oral Daily  . fentaNYL      . fentaNYL      . glycopyrrolate  2 mg Oral BID  . metoprolol tartrate  25 mg Oral BID  . nicotine  21 mg Transdermal Daily  . NIFEdipine  120 mg Oral Daily  . warfarin  10 mg Oral ONCE-1800  . Warfarin - Pharmacist Dosing Inpatient   Does not apply q1800   Continuous  Infusions: . heparin    . lactated ringers 10 mL/hr at 10/19/15 0915   PRN Meds:.albuterol, diclofenac sodium, hydrALAZINE, morphine injection, ondansetron, oxyCODONE-acetaminophen, tiZANidine  Antibiotics  :    Anti-infectives    Start     Dose/Rate Route Frequency Ordered Stop   10/19/15 0702  ceFAZolin (ANCEF) 2-3 GM-% IVPB SOLR    Comments:  Merrilyn Puma   : cabinet override      10/19/15 0702 10/19/15 0742   10/19/15 0000  ceFAZolin (ANCEF) IVPB 2 g/50 mL premix     2 g 100 mL/hr over 30 Minutes Intravenous  Once 10/18/15 1338 10/19/15 0039   10/17/15 0600  clindamycin (CLEOCIN) IVPB 600 mg     600 mg 100 mL/hr over 30 Minutes Intravenous 3 times per day 10/17/15 0253     10/16/15 2300  clindamycin (CLEOCIN) IVPB 600 mg     600 mg 100 mL/hr over 30 Minutes Intravenous  Once 10/16/15 2245 10/17/15 0218        Objective:   Filed Vitals:   10/19/15 1030 10/19/15 1055 10/19/15 1129 10/19/15 1219  BP: 167/94 179/113 171/92  161/98  Pulse: 80 82 84   Temp: 98.1 F (36.7 C) 98.1 F (36.7 C) 98.3 F (36.8 C)   TempSrc:   Oral   Resp: 10 16 12    Height:      Weight:      SpO2: 93% 95% 94%     Wt Readings from Last 3 Encounters:  10/17/15 101.5 kg (223 lb 12.3 oz)  08/04/15 115.667 kg (255 lb)  05/09/15 110.859 kg (244 lb 6.4 oz)     Intake/Output Summary (Last 24 hours) at 10/19/15 1240 Last data filed at 10/19/15 1100  Gross per 24 hour  Intake   2430 ml  Output   1700 ml  Net    730 ml     Physical Exam  Awake Alert, Oriented X 3, No new F.N deficits, Normal affect [place].AT,PERRAL, L upper maxilla large dental abscess with L facial swelling Supple Neck,No JVD, No cervical lymphadenopathy appriciated.  Symmetrical Chest wall movement, Good air movement bilaterally, CTAB RRR,No Gallops,Rubs or new Murmurs, No Parasternal Heave +ve B.Sounds, Abd Soft, No tenderness, No organomegaly appriciated, No rebound - guarding or rigidity. No Cyanosis, Clubbing or edema,  No new Rash or bruise       Data Review:   Micro Results Recent Results (from the past 240 hour(s))  Surgical pcr screen     Status: None   Collection Time: 10/19/15  5:55 AM  Result Value Ref Range Status   MRSA, PCR NEGATIVE NEGATIVE Final   Staphylococcus aureus NEGATIVE NEGATIVE Final    Comment:        The Xpert SA Assay (FDA approved for NASAL specimens in patients over 70 years of age), is one component of a comprehensive surveillance program.  Test performance has been validated by [facility] for patients greater than or equal to 76 year old. It is not intended to diagnose infection nor to guide or monitor treatment.     Radiology Reports Dg Orthopantogram  10/17/2015  CLINICAL DATA:  Dental abscess, left upper. Two days of pain and swelling. EXAM: ORTHOPANTOGRAM/PANORAMIC COMPARISON:  10/17/2015 FINDINGS: The dental cavities shown on today's CT scan are somewhat less conspicuous on today's Panorex. Tooth # 1 is absent. Tooth # 2 is severely decayed with most of the tooth (particularly the crown) missing ; this is the tooth with the periapical lucency on prior CT scan. This was referred to as tooth # 1 on CT but is thought actually be tooth # 2. The very large cavity of tooth # 12 is observed, with absent crown. Smaller cavities such as the posterior-labial cavity of tooth # 16 are visible but less conspicuous compared to the CT. IMPRESSION: 1. Very large cavities of teeth # 2 and # 12. The periapical lucency referred to on the prior CT is associated with the roots of tooth # 2. Tooth # 1 is absent. 2. Dental cavities. Electronically Signed   By: Van Clines M.D.   On: 10/17/2015 09:15   Ct Maxillofacial W/cm  10/17/2015  ADDENDUM REPORT: 10/17/2015 02:33 ADDENDUM: Additional reformations submitted with larger field of view. Re- demonstration of LEFT facial soft tissue swelling extending to the nasal labial old, no abscess on these additional images. Electronically  Signed   By: Elon Alas M.D.   On: 10/17/2015 02:33  10/17/2015  CLINICAL DATA:  LEFT upper tooth pain, LEFT facial swelling with spontaneous decompression. EXAM: CT MAXILLOFACIAL WITH CONTRAST TECHNIQUE: Multidetector CT imaging of the maxillofacial structures was performed with intravenous contrast.  Multiplanar CT image reconstructions were also generated. A small metallic BB was placed on the right temple in order to reliably differentiate right from left. The patient was rescanned, as initial examination did not include lower face. CONTRAST:  46mL OMNIPAQUE IOHEXOL 300 MG/ML SOLN, 26mL OMNIPAQUE IOHEXOL 300 MG/ML SOLN, COMPARISON:  None. FINDINGS: Multiple bilateral dental caries with tooth 1 periapical abscess. Early suspected multiple additional periapical abscess. No facial fracture. No suspicious bony lesions. RIGHT maxillary mucosal retention cyst, mild paranasal sinus mucosal thickening without air-fluid levels. Nasal septum is midline. Bilateral concha bullosa. Ocular globes and orbital contents are normal. Included intracranial contents are normal. The included LEFT facial subcutaneous fat demonstrates stranding, mildly thickened LEFT platysma. No focal fluid collections, subcutaneous gas or radiopaque foreign bodies. IMPRESSION: LEFT facial cellulitis without drainable fluid collection; findings may be odontogenic as there multiple dental caries. Tooth 1 periapical lucency/abscess. Electronically Signed: By: Elon Alas M.D. On: 10/17/2015 02:05     CBC  Recent Labs Lab 10/16/15 1627 10/18/15 0551 10/19/15 1034  WBC 6.0 2.8* 5.7  HGB 12.3* 11.4* 12.4*  HCT 37.6* 34.2* 37.4*  PLT 215 239 288  MCV 70.1* 70.2* 70.0*  MCH 22.9* 23.4* 23.2*  MCHC 32.7 33.3 33.2  RDW 15.7* 15.8* 15.8*  LYMPHSABS 1.4  --   --   MONOABS 0.9  --   --   EOSABS 0.0  --   --   BASOSABS 0.0  --   --     Chemistries   Recent Labs Lab 10/16/15 1627  NA 135  K 4.0  CL 95*  CO2 28  GLUCOSE  90  BUN 6  CREATININE 0.93  CALCIUM 9.1  AST 19  ALT 14*  ALKPHOS 42  BILITOT 0.6   ------------------------------------------------------------------------------------------------------------------ No results for input(s): CHOL, HDL, LDLCALC, TRIG, CHOLHDL, LDLDIRECT in the last 72 hours.  No results found for: HGBA1C ------------------------------------------------------------------------------------------------------------------ No results for input(s): TSH, T4TOTAL, T3FREE, THYROIDAB in the last 72 hours.  Invalid input(s): FREET3 ------------------------------------------------------------------------------------------------------------------ No results for input(s): VITAMINB12, FOLATE, FERRITIN, TIBC, IRON, RETICCTPCT in the last 72 hours.  Coagulation profile  Recent Labs Lab 10/17/15 0457 10/18/15 0551 10/19/15 1034  INR 1.72* 1.67* 1.24    No results for input(s): DDIMER in the last 72 hours.  Cardiac Enzymes No results for input(s): CKMB, TROPONINI, MYOGLOBIN in the last 168 hours.  Invalid input(s): CK ------------------------------------------------------------------------------------------------------------------ No results found for: BNP  Time Spent in minutes   35   SINGH,PRASHANT K M.D on 10/19/2015 at 12:40 PM  Between 7am to 7pm - Pager - [phone]  After 7pm go to [email or web address] - password [facility]  [facility] -  Office  [phone]

## 2015-10-19 NOTE — Anesthesia Preprocedure Evaluation (Addendum)
Anesthesia Evaluation  Patient identified by MRN, date of birth, ID band Patient awake    Reviewed: Allergy & Precautions, NPO status , Patient's Chart, lab work & pertinent test results  Airway Mallampati: II  TM Distance: >3 FB     Dental  (+) Dental Advisory Given   Pulmonary shortness of breath and with exertion, Current Smoker,  Hx of PE and DVT - s/p filter, on coumadin therapy   breath sounds clear to auscultation       Cardiovascular hypertension, Pt. on medications and Pt. on home beta blockers  Rhythm:Regular Rate:Normal     Neuro/Psych PSYCHIATRIC DISORDERS Anxiety Depression    GI/Hepatic GERD  ,  Endo/Other    Renal/GU      Musculoskeletal   Abdominal   Peds  Hematology   Anesthesia Other Findings   Reproductive/Obstetrics                           Anesthesia Physical Anesthesia Plan  ASA: III  Anesthesia Plan: General   Post-op Pain Management:    Induction: Intravenous  Airway Management Planned: Nasal ETT  Additional Equipment:   Intra-op Plan:   Post-operative Plan: Extubation in OR  Informed Consent: I have reviewed the patients History and Physical, chart, labs and discussed the procedure including the risks, benefits and alternatives for the proposed anesthesia with the patient or authorized representative who has indicated his/her understanding and acceptance.   Dental advisory given  Plan Discussed with: Anesthesiologist and Surgeon  Anesthesia Plan Comments:         Anesthesia Quick Evaluation

## 2015-10-19 NOTE — Op Note (Signed)
OPERATIVE REPORT  Patient:            [name] [name] Date of Birth:  1951/08/06 MRN:                [id number]   DATE OF PROCEDURE:  10/19/2015  PREOPERATIVE DIAGNOSES: 1. History of left facial swelling 2. History of acute pulpitis 3. Periapical abscess 4. Chronic apical periodontitis 5. Multiple retained root segments 6. Dental caries 7. Chronic anticoagulation  POSTOPERATIVE DIAGNOSES: 1. History of left facial swelling 2. History of acute pulpitis 3. Periapical abscess 4. Chronic apical periodontitis 5. Multiple retained root segments 6. Dental caries 7. Chronic anticoagulation  OPERATIONS: 1. Multiple extraction of tooth numbers 2, 12, and 13  2. 2 Quadrants of alveoloplasty   SURGEON: Lenn Cal, DDS  ASSISTANT: Camie Patience, (dental assistant)  ANESTHESIA: General anesthesia via oral endotracheal tube.  MEDICATIONS: 1. Ancef 2 g IV prior to invasive dental procedures. 2. Local anesthesia with a total utilization of 5 carpules each containing 34 mg of lidocaine with 0.017 mg of epinephrine as well as 1 carpules each containing 9 mg of bupivacaine with 0.009 mg of epinephrine.  SPECIMENS: There are 3 teeth that were discarded.  DRAINS: None  CULTURES: None  COMPLICATIONS: None   ESTIMATED BLOOD LOSS: 25 mLs.  INTRAVENOUS FLUIDS: 1000 mLs of Lactated ringers solution.  INDICATIONS: The patient was recently diagnosed with left facial swelling and acute pulpitis.  A dental consultation was then requested to evaluate patient for dental etiology and provide treatment as indicated..  The patient was examined and treatment planned for multiple extractions with alveoloplasty in the operating with general anesthesia.  This treatment plan was formulated to decrease the risks and complications associated with dental infection from further affecting the patient's systemic health.  OPERATIVE FINDINGS: Patient was examined in operating room number 9.  The  teeth were identified for extraction. Tooth numbers 2, 12, 13 were identified for extraction. Extensive dental caries were noted to be affecting tooth #13 during the extraction of tooth #12.  It was also determined, due to the difficult extraction of tooth numbers 2, 12, and 13,  that tooth #16 would not be extracted at this time and would be left for an oral surgeon to extract in the future as indicated since this tooth was asymptomatic. An examination of the mandibular teeth did not reveal any etiology for extractions at this time and further problems with the mandibular arch can be followed up with the general dentist of choice once he is medically stable. The patient was noted be affected by history of left facial swelling, history of periapical abscess #12, history of acute pulpitis, chronic apical periodontitis, multiple retained root segments, and dental caries.   DESCRIPTION OF PROCEDURE: Patient was brought to the main operating room number 9. Patient was then placed in the supine position on the operating table. General anesthesia was then induced per the anesthesia team. The patient was then prepped and draped in the usual manner for dental medicine procedure. A timeout was performed. The patient was identified and procedures were verified. A throat pack was placed at this time. The oral cavity was then thoroughly examined with the findings noted above. The patient was then ready for dental medicine procedure as follows:  Local anesthesia was then administered sequentially with a total utilization of 5 carpules each containing 34 mg of lidocaine with 0.017 mg of epinephrine as well as 1 carpules  each containing 9 mg bupivacaine with 0.009  mg of epinephrine.  The Maxillary left and right quadrants first approached. Anesthesia was then delivered utilizing infiltration with lidocaine with epinephrine. A #15 blade incision was then made from the  distal of #1 and extended to the mesial of #3.  A   surgical flap was then carefully reflected. Appropriate amounts of buccal and interseptal bone were then removed utilizing a surgical handpiece and bur and copious amounts of sterile water around retained roots of #2.  The retained roots of #2 were then elevated and removed with a series of elevators and rongeurs without complication. Alveoloplasty was then performed utilizing a ronguers and bone file. The surgical site was then irrigated with copious amounts of sterile saline. The tissues were approximated and trimmed appropriately. A piece of Surgical was placed in the extraction sockets appropriately. The surgical site was then closed from the distal of #1 and extended to the distal of #3 utilizing 3-0 chromic gut suture in a continuous interrupted suture technique 1.  At this point time the maxillary left quadrant was approached. A 15 blade incision was made from the distal of #12 and extended to the mesial of #10.  A surgical flap was then reflected on the buccal and palatal aspects.  Appropriate amounts of buccal and interseptal bone was then removed a surgical handpiece and bur and copious amounts sterile water. The retained roots area #12 were then elevated and removed utilizing a 150 forceps, series of rongeurs, and cryers elevators. At this point time extensive dental caries were noted on the mesial aspect of #13 making this tooth nonrestorable. The buccal flap was then futher reflected to the mesial of #14. Bone was then removed utilizing a surgical handpiece and bur and copious pus sterile water on the buccal and lingual aspects of tooth #13. Tooth #13 was then subluxated with a series straight elevators and the coronal aspect and palatal root was then removed with a 150 forceps leaving the buccal root remaining. Further bone was then removed utilizing a surgical handpiece and bur and copious amounts sterile water around retained root on the buccal. The bucal root was then elevated out with a root tip  pick appropriately. Alveoloplasty was performed utilizing a rongeur and bone file. Buccal exostoses in the area of tooth numbers 12 and 13 were removed at this time. The surgical site was then irrigated with copious amounts sterile saline. Tissues were approximated and trimmed appropriately. A piece of Surgicel was then placed in the extraction sockets appropriately. The surgical site was then closed from the mesial #14 extended to the distal of #11 utilizing 3-0 chromic gut suture in a continuous interrupted suture technique 1. An interproximal suture was then placed between tooth numbers 10/11 to further close the surgical site.  At this point time, the entire mouth was irrigated with copious amounts of sterile saline. The patient was examined for complications, seeing none, the dental medicine procedure was deemed to be complete. The throat pack was removed at this time. An oral airway was then placed at the request of the anesthesia team. A series of 4 x 4 gauze moistened with Amicar 5% oral rinse were placed in the mouth to aid hemostasis. The patient was then handed over to the anesthesia team for final disposition. After an appropriate amount of time, the patient was extubated and taken to the postanesthsia care unit in good condition. All counts were correct for the dental medicine procedure. The patient will continue the Amicar 5% oral rinses postoperatively. Patient is to  rinse with 10 mls every hour for the next 10 hours in a swish and spit manner. IV heparin (NO BOLUS) or Lovenox therapy may be restarted 12 hours postoperatively which is approximately 2100 hours. Patient is to be transitioned back to warfarin therapy per his medical team.   Lenn Cal, DDS.

## 2015-10-19 NOTE — Progress Notes (Signed)
PRE-OPERATIVE NOTE:  10/19/2015 Adria Devon [id number]  VITALS: BP 135/82 mmHg  Pulse 72  Temp(Src) 98.4 F (36.9 C) (Oral)  Resp 18  Ht 6\' 4"  (1.93 m)  Wt 223 lb 12.3 oz (101.5 kg)  BMI 27.25 kg/m2  SpO2 98%  Lab Results  Component Value Date   WBC 2.8* 10/18/2015   HGB 11.4* 10/18/2015   HCT 34.2* 10/18/2015   MCV 70.2* 10/18/2015   PLT 239 10/18/2015   BMET    Component Value Date/Time   NA 135 10/16/2015 1627   NA 143 04/04/2014 1116   K 4.0 10/16/2015 1627   K 3.7 04/04/2014 1116   CL 95* 10/16/2015 1627   CO2 28 10/16/2015 1627   CO2 30* 04/04/2014 1116   GLUCOSE 90 10/16/2015 1627   GLUCOSE 89 04/04/2014 1116   BUN 6 10/16/2015 1627   BUN 10.1 04/04/2014 1116   CREATININE 0.93 10/16/2015 1627   CREATININE 1.0 04/04/2014 1116   CALCIUM 9.1 10/16/2015 1627   CALCIUM 9.3 04/04/2014 1116   GFRNONAA >60 10/16/2015 1627   GFRAA >60 10/16/2015 1627    Lab Results  Component Value Date   INR 1.67* 10/18/2015   INR 1.72* 10/17/2015   INR 1.60 10/09/2015   PROTIME 21.6* 05/19/2015   PROTIME 24.0* 04/25/2015   PROTIME 13.2 04/17/2015   No results found for: PTT   Adria Devon presents for multiple dental extractions with alveoloplasty and pre-prosthetic surgery as needed in the operating room with general anesthesia. I will proceed with extraction of tooth numbers 2 and 12, and consideration for extraction of tooth numbers 13, 14, 15, and 16 as needed. Patient also is now complaining of some non-specific dental pain of lower left quadrant and I will evaluate for extraction of lower  teeth as well.  SUBJECTIVE: The patient denies any acute medical or dental changes and agrees to proceed with treatment as planned.  EXAM: No sign of acute dental changes.  ASSESSMENT: Patient is affected by acute pulpitis, history of left facial swelling, periapical abscess, chronic apical periodontitis, multiple retained root segments, dental caries, tooth mobility, and  malocclusion.  PLAN: Patient agrees to proceed with treatment as planned in the operating room as previously discussed and accepts the risks, benefits, and complications of the proposed treatment. Patient is aware of the risk for bleeding, bruising, swelling, infection, pain, nerve damage, soft tissue damage, damage to adjacent teeth, sinus involvement, root tip fracture, mandible fracture, and the risks of complications associated with the anesthesia. Patient also is aware of the potential for other complications not mentioned above.   Lenn Cal, DDS

## 2015-10-19 NOTE — Progress Notes (Addendum)
[place] for heparin and coumadin Indication: Hx VTE  Allergies  Allergen Reactions  . Aspirin Other (See Comments)    ulcers  . Celecoxib Other (See Comments)    Stomach irritation  . Ibuprofen Other (See Comments)    ulcers  . Lyrica [Pregabalin] Swelling  . Vicodin [Hydrocodone-Acetaminophen]     Makes me mean    Patient Measurements: Height: 6\' 4"  (193 cm) Weight: 223 lb 12.3 oz (101.5 kg) IBW/kg (Calculated) : 86.8  Vital Signs: Temp: 98.1 F (36.7 C) (02/09 1055) Temp Source: Oral (02/09 0533) BP: 179/113 mmHg (02/09 1055) Pulse Rate: 82 (02/09 1055)  Labs:  Recent Labs  10/16/15 1627 10/17/15 0457 10/17/15 2150 10/18/15 0551 10/18/15 1138 10/19/15 1034  HGB 12.3*  --   --  11.4*  --  12.4*  HCT 37.6*  --   --  34.2*  --  37.4*  PLT 215  --   --  239  --  288  LABPROT  --  20.1*  --  19.7*  --  15.7*  INR  --  1.72*  --  1.67*  --  1.24  HEPARINUNFRC  --   --  <0.10* 0.34 0.41  --   CREATININE 0.93  --   --   --   --   --     Estimated Creatinine Clearance: 97.2 mL/min (by C-G formula based on Cr of 0.93).   Assessment: 65 y/o M here with dental pain/facial swelling. On warfarin PTA for hx VTE. Warfarin dose per outpatient anti-coag notes from 10/09/15 is 10 mg Mon/Wed/Fri, 7.5 mg all other days. Pt of [facility] coumadin clinic Hep for bridge tx while coumadin held for OR, HDW 101.5 kg S/p multiple teeth extraction w/ alveoloplasty Thursday 2/9  Dentist plans amicar oral rinse q4h x 10 hrs and to start heparin with NO bolus or LMWH 12 hrs post op at 2100. Hg stable 12.4 this am, pltc WNL, expect post-op oral bleeding.  Goal of Therapy:  INR 2-3 HL 0.3 - 0.7  Monitor platelets by anticoagulation protocol: Yes   Plan: resume heparin drip with no bolus at 2100 per dentist at rate of 1600 units/hr and check 8 hr HL with am labs Resume coumadin with 10 mg po x 1 dose tonight Daily HL/CBC/INR  Eudelia Bunch,  Pharm.D. [phone] 10/19/2015 11:27 AM  Addendum: order to cancel heparin protocol and start LMWH for VTE treatment 2/10 at 0800 am and resume coumadin tonight.  Plan: LMWH 1 mg/kg = 100 mg sq q12h to start Friday 2/10 at 0800 am Coumadin 10 mg po x 1 dose tonight Daily INR  Eudelia Bunch, Pharm.D. [id number] 10/19/2015 1:22 PM

## 2015-10-19 NOTE — Anesthesia Procedure Notes (Signed)
Procedure Name: Intubation Date/Time: 10/19/2015 7:40 AM Performed by: Merrilyn Puma B Pre-anesthesia Checklist: Patient identified, Timeout performed, Emergency Drugs available, Suction available and Patient being monitored Patient Re-evaluated:Patient Re-evaluated prior to inductionOxygen Delivery Method: Circle system utilized Preoxygenation: Pre-oxygenation with 100% oxygen Intubation Type: IV induction and Cricoid Pressure applied Ventilation: Mask ventilation without difficulty Laryngoscope Size: Mac and 4 Grade View: Grade II Tube type: Oral Rae Tube size: 8.0 mm Number of attempts: 1 Placement Confirmation: CO2 detector,  positive ETCO2,  ETT inserted through vocal cords under direct vision and breath sounds checked- equal and bilateral Secured at: 24 cm Tube secured with: Tape Dental Injury: Teeth and Oropharynx as per pre-operative assessment

## 2015-10-20 ENCOUNTER — Encounter (HOSPITAL_COMMUNITY): Payer: Self-pay | Admitting: Dentistry

## 2015-10-20 DIAGNOSIS — K08409 Partial loss of teeth, unspecified cause, unspecified class: Secondary | ICD-10-CM

## 2015-10-20 LAB — BASIC METABOLIC PANEL
Anion gap: 6 (ref 5–15)
BUN: 7 mg/dL (ref 6–20)
CO2: 31 mmol/L (ref 22–32)
Calcium: 9.4 mg/dL (ref 8.9–10.3)
Chloride: 101 mmol/L (ref 101–111)
Creatinine, Ser: 0.89 mg/dL (ref 0.61–1.24)
GFR calc Af Amer: >60 mL/min (ref >60)
GFR calc non Af Amer: >60 mL/min (ref >60)
Glucose, Bld: 96 mg/dL (ref 65–99)
Potassium: 4.3 mmol/L (ref 3.5–5.1)
Sodium: 138 mmol/L (ref 135–145)

## 2015-10-20 LAB — PROTIME-INR
INR: 1.27 (ref 0.00–1.49)
Prothrombin Time: 16 seconds — ABNORMAL HIGH (ref 11.6–15.2)

## 2015-10-20 MED ORDER — ENOXAPARIN SODIUM 150 MG/ML ~~LOC~~ SOLN: 1.0000 mg/kg | Freq: Two times a day (BID) | SUBCUTANEOUS | Status: DC

## 2015-10-20 MED ORDER — OXYCODONE-ACETAMINOPHEN 10-325 MG PO TABS: 1.0000 | ORAL_TABLET | ORAL | Status: DC | PRN

## 2015-10-20 MED ORDER — CLINDAMYCIN HCL 300 MG PO CAPS: 300.0000 mg | ORAL_CAPSULE | Freq: Four times a day (QID) | ORAL | Status: DC

## 2015-10-20 NOTE — Care Management Important Message (Signed)
Important Message  Patient Details  Name: DEVION SABAL MRN: [id number] Date of Birth: 1950/11/07   Medicare Important Message Given:  Yes    Deepti Gunawan P [place] 10/20/2015, 3:12 PM

## 2015-10-20 NOTE — Discharge Instructions (Signed)
Follow with Primary MD [name], MD in 3 days   Get CBC, BMP, INR,  checked  by Primary MD next visit.    Activity: As tolerated with Full fall precautions use walker/cane & assistance as needed   Disposition Home    Diet:  Soft.  For Heart failure patients - Check your Weight same time everyday, if you gain over 2 pounds, or you develop in leg swelling, experience more shortness of breath or chest pain, call your Primary MD immediately. Follow Cardiac Low Salt Diet and 1.5 lit/day fluid restriction.   On your next visit with your primary care physician please Get Medicines reviewed and adjusted.   Please request your Prim.MD to go over all Hospital Tests and Procedure/Radiological results at the follow up, please get all Hospital records sent to your Prim MD by signing hospital release before you go home.   If you experience worsening of your admission symptoms, develop shortness of breath, life threatening emergency, suicidal or homicidal thoughts you must seek medical attention immediately by calling 911 or calling your MD immediately  if symptoms less severe.  You Must read complete instructions/literature along with all the possible adverse reactions/side effects for all the Medicines you take and that have been prescribed to you. Take any new Medicines after you have completely understood and accpet all the possible adverse reactions/side effects.   Do not drive, operating heavy machinery, perform activities at heights, swimming or participation in water activities or provide baby sitting services if your were admitted for syncope or siezures until you have seen by Primary MD or a Neurologist and advised to do so again.  Do not drive when taking Pain medications.    Do not take more than prescribed Pain, Sleep and Anxiety Medications  Special Instructions: If you have smoked or chewed Tobacco  in the last 2 yrs please stop smoking, stop any regular Alcohol  and or any  Recreational drug use.  Wear Seat belts while driving.   Please note  You were cared for by a hospitalist during your hospital stay. If you have any questions about your discharge medications or the care you received while you were in the hospital after you are discharged, you can call the unit and asked to speak with the hospitalist on call if the hospitalist that took care of you is not available. Once you are discharged, your primary care physician will handle any further medical issues. Please note that NO REFILLS for any discharge medications will be authorized once you are discharged, as it is imperative that you return to your primary care physician (or establish a relationship with a primary care physician if you do not have one) for your aftercare needs so that they can reassess your need for medications and monitor your lab values.       MOUTH CARE AFTER SURGERY  FACTS:  Ice used in ice bag helps keep the swelling down, and can help lessen the pain.  It is easier to treat pain BEFORE it happens.  Spitting disturbs the clot and may cause bleeding to start again, or to get worse.  Smoking delays healing and can cause complications.  Sharing prescriptions can be dangerous.  Do not take medications not recently prescribed for you.  Antibiotics may stop birth control pills from working.  Use other means of birth control while on antibiotics.  Warm salt water rinses after the first 24 hours will help lessen the swelling:  Use 1/2 teaspoonful of table salt  per oz.of water.  DO NOT:  Do not spit.  Do not drink through a straw.  Strongly advised not to smoke, dip snuff or chew tobacco at least for 3 days.  Do not eat sharp or crunchy foods.  Avoid the area of surgery when chewing.  Do not stop your antibiotics before your instructions say to do so.  Do not eat hot foods until bleeding has stopped.  If you need to, let your food cool down to room  temperature.  EXPECT:  Some swelling, especially first 2-3 days.  Soreness or discomfort in varying degrees.  Follow your dentist's instructions about how to handle pain before it starts.  Pinkish saliva or light blood in saliva, or on your pillow in the morning.  This can last around 24 hours.  Bruising inside or outside the mouth.  This may not show up until 2-3 days after surgery.  Don't worry, it will go away in time.  Pieces of "bone" may work themselves loose.  It's OK.  If they bother you, let us know.  WHAT TO DO IMMEDIATELY AFTER SURGERY:  Bite on the gauze with steady pressure for 1-2 hours.  Don't chew on the gauze.  Do not lie down flat.  Raise your head support especially for the first 24 hours.  Apply ice to your face on the side of the surgery.  You may apply it 20 minutes on and a few minutes off.  Ice for 8-12 hours.  You may use ice up to 24 hours.  Before the numbness wears off, take a pain pill as instructed.  Prescription pain medication is not always required.  SWELLING:  Expect swelling for the first couple of days.  It should get better after that.  If swelling increases 3 days or so after surgery; let us know as soon as possible.  FEVER:  Take Tylenol every 4 hours if needed to lower your temperature, especially if it is at 100F or higher.  Drink lots of fluids.  If the fever does not go away, let us know.  BREATHING TROUBLE:  Any unusual difficulty breathing means you have to have someone bring you to the emergency room ASAP  BLEEDING:  Light oozing is expected for 24 hours or so.  Prop head up with pillows  Avoid spitting  Do not confuse bright red fresh flowing blood with lots of saliva colored with a little bit of blood.  If you notice some bleeding, place gauze or a tea bag where it is bleeding and apply CONSTANT pressure by biting down for 1 hour.  Avoid talking during this time.  Do not remove the gauze or tea bag during this hour  to "check" the bleeding.  If you notice bright RED bleeding FLOWING out of particular area, and filling the floor of your mouth, put a wad of gauze on that area, bite down firmly and constantly.  Call us immediately.  If we're closed, have someone bring you to the emergency room.  ORAL HYGIENE:  Brush your teeth as usual after meals and before bedtime.  Use a soft toothbrush around the area of surgery.  DO NOT AVOID BRUSHING.  Otherwise bacteria(germs) will grow and may delay healing or encourage infection.  Since you cannot spit, just gently rinse and let the water flow out of your mouth.  DO NOT SWISH HARD.  EATING:  Cool liquids are a good point to start.  Increase to soft foods as tolerated.  PRESCRIPTIONS:  Follow  the directions for your prescriptions exactly as written.  If Dr. Enrique Sack gave you a narcotic pain medication, do not drive, operate machinery or drink alcohol when on that medication.  QUESTIONS:  Call our office during office hours [phone] or call the Emergency Room at [phone].

## 2015-10-20 NOTE — Discharge Summary (Signed)
[name] [name], is a 65 y.o. male  DOB August 26, 1951  MRN [id number].  Admission date:  10/16/2015  Admitting Physician  Etta Quill, DO  Discharge Date:  10/20/2015   Primary MD  [name], MD  Recommendations for primary care physician for things to follow:   Kindly check CBC, BMP and INR in 3 days.   Admission Diagnosis  Dental abscess [K04.7] Facial cellulitis [L03.211]   Discharge Diagnosis  Dental abscess [K04.7] Facial cellulitis [L03.211]     Active Problems:   Dental abscess   Facial cellulitis      Past Medical History  Diagnosis Date  . Hypertension   . History of blood clots   . Diverticulitis   . Hernia   . Diverticulosis   . Shingles   . Gout   . Back pain   . Neuropathy, peripheral ([place]) 05/15/2012  . DVT (deep venous thrombosis) ([place])   . Anxiety and depression   . Nausea and vomiting     Past Surgical History  Procedure Laterality Date  . Back surgery    . Hernia repair    . Hiatal hernia repair    . Appendectomy    . Cholecystectomy  2008  . Sacral nerve stimulator placement  2011  . Left heart catheterization with coronary angiogram N/A 08/02/2014    Procedure: LEFT HEART CATHETERIZATION WITH CORONARY ANGIOGRAM;  Surgeon: Laverda Page, MD;  Location: [facility] CATH LAB;  Service: Cardiovascular;  Laterality: N/A;       HPI  from the history and physical done on the day of admission:    [name] [name] is a 65 y.o. male who presents to the ED with c/o dental pain and swelling to the L side of his face. Symptoms onset 3 days ago on L side of upper mouth. Significantly worse today. When brushing his teeth today he noticed that a left upper tooth (tooth 12) is missing, and the socket is painful, and is exuding a purulent material.   Hospital Course:     1.L Facial  cellulitis and dental abscess - continue on IV ABX, Dentist called, post dental surgery and extraction on 10/19/2015 - Multiple extraction of tooth numbers 2, 12, and 13 & 2 Quadrants of alveoloplasty. Is much better this morning after surgery, seen by dental surgery and cleared for discharge on oral clindamycin for 5 days. We'll place him on oral pain medications also.  2. DVT multilpe - held coumadin, bridged with Hep gtt/ lovenox , will be discharged with Lovenox, request PCP to check INR on Monday, stop Lovenox once INR reaches 2.  3.HTN -  continue home regimen of ARB-HCTZ and beta blocker.  4.Smoking - counseled.   Discharge Condition: Stable  Follow UP  Follow-up Information    Follow up with Teena Dunk, DDS. Schedule an appointment as soon as possible for a visit on 10/30/2015.   Specialty:  Dentistry   Why:  For suture removal   Contact information:   [address] [place]  [address] [phone]       Follow up with [name], MD. Schedule an appointment as soon as possible for a visit in 3 days.   Specialty:  Internal Medicine   Why:  CBC, BMP, INR check   Contact information:   [address] [place] [address] [phone]        Consults obtained - Dentist  Diet and Activity recommendation: See Discharge Instructions below  Discharge Instructions       Discharge Instructions    Discharge instructions    Complete by:  As directed   Follow with Primary MD [name], MD in 3 days   Get CBC, BMP, INR,  checked  by Primary MD next visit.    Activity: As tolerated with Full fall precautions use walker/cane & assistance as needed   Disposition Home    Diet:  Soft.  For Heart failure patients - Check your Weight same time everyday, if you gain over 2 pounds, or you develop in leg swelling, experience more shortness of breath or chest pain, call your Primary MD immediately. Follow Cardiac Low Salt Diet and 1.5 lit/day fluid  restriction.   On your next visit with your primary care physician please Get Medicines reviewed and adjusted.   Please request your Prim.MD to go over all Hospital Tests and Procedure/Radiological results at the follow up, please get all Hospital records sent to your Prim MD by signing hospital release before you go home.   If you experience worsening of your admission symptoms, develop shortness of breath, life threatening emergency, suicidal or homicidal thoughts you must seek medical attention immediately by calling 911 or calling your MD immediately  if symptoms less severe.  You Must read complete instructions/literature along with all the possible adverse reactions/side effects for all the Medicines you take and that have been prescribed to you. Take any new Medicines after you have completely understood and accpet all the possible adverse reactions/side effects.   Do not drive, operating heavy machinery, perform activities at heights, swimming or participation in water activities or provide baby sitting services if your were admitted for syncope or siezures until you have seen by Primary MD or a Neurologist and advised to do so again.  Do not drive when taking Pain medications.    Do not take more than prescribed Pain, Sleep and Anxiety Medications  Special Instructions: If you have smoked or chewed Tobacco  in the last 2 yrs please stop smoking, stop any regular Alcohol  and or any Recreational drug use.  Wear Seat belts while driving.   Please note  You were cared for by a hospitalist during your hospital stay. If you have any questions about your discharge medications or the care you received while you were in the hospital after you are discharged, you can call the unit and asked to speak with the hospitalist on call if the hospitalist that took care of you is not available. Once you are discharged, your primary care physician will handle any further medical issues. Please note  that NO REFILLS for any discharge medications will be authorized once you are discharged, as it is imperative that you return to your primary care physician (or establish a relationship with a primary care physician if you do not have one) for your aftercare needs so that they can reassess your need for medications and monitor your lab values.       MOUTH CARE AFTER SURGERY  FACTS: Ice used in ice bag helps keep the  swelling down, and can help lessen the pain. It is easier to treat pain BEFORE it happens. Spitting disturbs the clot and may cause bleeding to start again, or to get worse. Smoking delays healing and can cause complications. Sharing prescriptions can be dangerous.  Do not take medications not recently prescribed for you. Antibiotics may stop birth control pills from working.  Use other means of birth control while on antibiotics. Warm salt water rinses after the first 24 hours will help lessen the swelling:  Use 1/2 teaspoonful of table salt per oz.of water.  DO NOT: Do not spit.  Do not drink through a straw. Strongly advised not to smoke, dip snuff or chew tobacco at least for 3 days. Do not eat sharp or crunchy foods.  Avoid the area of surgery when chewing. Do not stop your antibiotics before your instructions say to do so. Do not eat hot foods until bleeding has stopped.  If you need to, let your food cool down to room temperature.  EXPECT: Some swelling, especially first 2-3 days. Soreness or discomfort in varying degrees.  Follow your dentist's instructions about how to handle pain before it starts. Pinkish saliva or light blood in saliva, or on your pillow in the morning.  This can last around 24 hours. Bruising inside or outside the mouth.  This may not show up until 2-3 days after surgery.  Don't worry, it will go away in time. Pieces of "bone" may work themselves loose.  It's OK.  If they bother you, let us know.  WHAT TO DO IMMEDIATELY AFTER SURGERY: Bite on  the gauze with steady pressure for 1-2 hours.  Don't chew on the gauze. Do not lie down flat.  Raise your head support especially for the first 24 hours. Apply ice to your face on the side of the surgery.  You may apply it 20 minutes on and a few minutes off.  Ice for 8-12 hours.  You may use ice up to 24 hours. Before the numbness wears off, take a pain pill as instructed. Prescription pain medication is not always required.  SWELLING: Expect swelling for the first couple of days.  It should get better after that. If swelling increases 3 days or so after surgery; let us know as soon as possible.  FEVER: Take Tylenol every 4 hours if needed to lower your temperature, especially if it is at 100F or higher. Drink lots of fluids. If the fever does not go away, let us know.  BREATHING TROUBLE: Any unusual difficulty breathing means you have to have someone bring you to the emergency room ASAP  BLEEDING: Light oozing is expected for 24 hours or so. Prop head up with pillows Avoid spitting Do not confuse bright red fresh flowing blood with lots of saliva colored with a little bit of blood. If you notice some bleeding, place gauze or a tea bag where it is bleeding and apply CONSTANT pressure by biting down for 1 hour.  Avoid talking during this time.  Do not remove the gauze or tea bag during this hour to "check" the bleeding. If you notice bright RED bleeding FLOWING out of particular area, and filling the floor of your mouth, put a wad of gauze on that area, bite down firmly and constantly.  Call us immediately.  If we're closed, have someone bring you to the emergency room.  ORAL HYGIENE: Brush your teeth as usual after meals and before bedtime. Use a soft toothbrush around the area  of surgery. DO NOT AVOID BRUSHING.  Otherwise bacteria(germs) will grow and may delay healing or encourage infection. Since you cannot spit, just gently rinse and let the water flow out of your mouth. DO NOT  SWISH HARD.  EATING: Cool liquids are a good point to start.  Increase to soft foods as tolerated.  PRESCRIPTIONS: Follow the directions for your prescriptions exactly as written. If Dr. Enrique Sack gave you a narcotic pain medication, do not drive, operate machinery or drink alcohol when on that medication.  QUESTIONS: Call our office during office hours [phone] or call the Emergency Room at [phone].     Increase activity slowly    Complete by:  As directed              Discharge Medications       Medication List    TAKE these medications        clindamycin 300 MG capsule  Commonly known as:  CLEOCIN  Take 1 capsule (300 mg total) by mouth 4 (four) times daily.     DULoxetine 60 MG capsule  Commonly known as:  CYMBALTA  Take 120 mg by mouth daily.     enoxaparin 150 MG/ML injection  Commonly known as:  LOVENOX  Inject 0.68 mLs (100 mg total) into the skin every 12 (twelve) hours. Stop once INR reaches 2     glycopyrrolate 2 MG tablet  Commonly known as:  ROBINUL  Take 1 tablet (2 mg total) by mouth 2 (two) times daily.     metoprolol tartrate 25 MG tablet  Commonly known as:  LOPRESSOR  Take 25 mg by mouth 2 (two) times daily.     NIFEDICAL XL 60 MG 24 hr tablet  Generic drug:  NIFEdipine  Take 120 mg by mouth daily.     ondansetron 4 MG tablet  Commonly known as:  ZOFRAN  Take 1 tablet (4 mg total) by mouth every 8 (eight) hours as needed for nausea or vomiting.     oxyCODONE-acetaminophen 10-325 MG tablet  Commonly known as:  PERCOCET  Take 1 tablet by mouth every 4 (four) hours as needed for pain.     PROAIR HFA 108 (90 Base) MCG/ACT inhaler  Generic drug:  albuterol  Inhale 1 puff into the lungs every 6 (six) hours as needed for wheezing.     tiZANidine 4 MG tablet  Commonly known as:  ZANAFLEX  Take 4 mg by mouth every 6 (six) hours as needed for muscle spasms.     valsartan-hydrochlorothiazide 320-25 MG tablet  Commonly known as:   DIOVAN-HCT  Take 1 tablet by mouth daily.     VOLTAREN 1 % Gel  Generic drug:  diclofenac sodium  Apply 2 g topically daily as needed. For neck and shoulder pain     warfarin 5 MG tablet  Commonly known as:  COUMADIN  Take 7.5-10 mg by mouth daily. Take two of the 5mg  tablets (total of 10mg ) on mon and tuesdays fridays, take 7.5mg (Split 5mg  tab in half ) on wed and saturdays and Sunday per patient        Major procedures and Radiology Reports - PLEASE review detailed and final reports for all details, in brief -       Dg Orthopantogram  10/17/2015  CLINICAL DATA:  Dental abscess, left upper. Two days of pain and swelling. EXAM: ORTHOPANTOGRAM/PANORAMIC COMPARISON:  10/17/2015 FINDINGS: The dental cavities shown on today's CT scan are somewhat less conspicuous on today's Panorex. Tooth # 1  is absent. Tooth # 2 is severely decayed with most of the tooth (particularly the crown) missing ; this is the tooth with the periapical lucency on prior CT scan. This was referred to as tooth # 1 on CT but is thought actually be tooth # 2. The very large cavity of tooth # 12 is observed, with absent crown. Smaller cavities such as the posterior-labial cavity of tooth # 16 are visible but less conspicuous compared to the CT. IMPRESSION: 1. Very large cavities of teeth # 2 and # 12. The periapical lucency referred to on the prior CT is associated with the roots of tooth # 2. Tooth # 1 is absent. 2. Dental cavities. Electronically Signed   By: Van Clines M.D.   On: 10/17/2015 09:15   Ct Maxillofacial W/cm  10/17/2015  ADDENDUM REPORT: 10/17/2015 02:33 ADDENDUM: Additional reformations submitted with larger field of view. Re- demonstration of LEFT facial soft tissue swelling extending to the nasal labial old, no abscess on these additional images. Electronically Signed   By: Elon Alas M.D.   On: 10/17/2015 02:33  10/17/2015  CLINICAL DATA:  LEFT upper tooth pain, LEFT facial swelling with  spontaneous decompression. EXAM: CT MAXILLOFACIAL WITH CONTRAST TECHNIQUE: Multidetector CT imaging of the maxillofacial structures was performed with intravenous contrast. Multiplanar CT image reconstructions were also generated. A small metallic BB was placed on the right temple in order to reliably differentiate right from left. The patient was rescanned, as initial examination did not include lower face. CONTRAST:  49mL OMNIPAQUE IOHEXOL 300 MG/ML SOLN, 69mL OMNIPAQUE IOHEXOL 300 MG/ML SOLN, COMPARISON:  None. FINDINGS: Multiple bilateral dental caries with tooth 1 periapical abscess. Early suspected multiple additional periapical abscess. No facial fracture. No suspicious bony lesions. RIGHT maxillary mucosal retention cyst, mild paranasal sinus mucosal thickening without air-fluid levels. Nasal septum is midline. Bilateral concha bullosa. Ocular globes and orbital contents are normal. Included intracranial contents are normal. The included LEFT facial subcutaneous fat demonstrates stranding, mildly thickened LEFT platysma. No focal fluid collections, subcutaneous gas or radiopaque foreign bodies. IMPRESSION: LEFT facial cellulitis without drainable fluid collection; findings may be odontogenic as there multiple dental caries. Tooth 1 periapical lucency/abscess. Electronically Signed: By: Elon Alas M.D. On: 10/17/2015 02:05    Micro Results      Recent Results (from the past 240 hour(s))  Surgical pcr screen     Status: None   Collection Time: 10/19/15  5:55 AM  Result Value Ref Range Status   MRSA, PCR NEGATIVE NEGATIVE Final   Staphylococcus aureus NEGATIVE NEGATIVE Final    Comment:        The Xpert SA Assay (FDA approved for NASAL specimens in patients over 45 years of age), is one component of a comprehensive surveillance program.  Test performance has been validated by [facility] for patients greater than or equal to 21 year old. It is not intended to diagnose infection nor  to guide or monitor treatment.        Today   Subjective    [name] [name] today has no headache,no chest abdominal pain,no new weakness tingling or numbness, feels much better wants to go home today.     Objective   Blood pressure 158/78, pulse 73, temperature 98.7 F (37.1 C), temperature source Oral, resp. rate 16, height 6\' 4"  (1.93 m), weight 101.5 kg (223 lb 12.3 oz), SpO2 100 %.   Intake/Output Summary (Last 24 hours) at 10/20/15 1003 Last data filed at 10/20/15 [id number]  Gross  per 24 hour  Intake   2210 ml  Output    500 ml  Net   1710 ml    Exam Awake Alert, Oriented x 3, No new F.N deficits, Normal affect [place].AT,PERRAL, improved L facial swelling and L upper gum edema Supple Neck,No JVD, No cervical lymphadenopathy appriciated.  Symmetrical Chest wall movement, Good air movement bilaterally, CTAB RRR,No Gallops,Rubs or new Murmurs, No Parasternal Heave +ve B.Sounds, Abd Soft, Non tender, No organomegaly appriciated, No rebound -guarding or rigidity. No Cyanosis, Clubbing or edema, No new Rash or bruise   Data Review   CBC w Diff: Lab Results  Component Value Date   WBC 5.7 10/19/2015   WBC 4.6 04/04/2014   HGB 12.4* 10/19/2015   HGB 13.1 04/04/2014   HCT 37.4* 10/19/2015   HCT 41.7 04/04/2014   PLT 288 10/19/2015   PLT 210 04/04/2014   LYMPHOPCT 24 10/16/2015   LYMPHOPCT 32.3 04/04/2014   BANDSPCT 0 04/26/2010   MONOPCT 15 10/16/2015   MONOPCT 10.6 04/04/2014   EOSPCT 0 10/16/2015   EOSPCT 1.1 04/04/2014   BASOPCT 0 10/16/2015   BASOPCT 1.1 04/04/2014    CMP: Lab Results  Component Value Date   NA 138 10/20/2015   NA 143 04/04/2014   K 4.3 10/20/2015   K 3.7 04/04/2014   CL 101 10/20/2015   CO2 31 10/20/2015   CO2 30* 04/04/2014   BUN 7 10/20/2015   BUN 10.1 04/04/2014   CREATININE 0.89 10/20/2015   CREATININE 1.0 04/04/2014   PROT 6.8 10/16/2015   PROT 6.6 04/04/2014   ALBUMIN 3.6 10/16/2015   ALBUMIN 3.8 04/04/2014   BILITOT 0.6  10/16/2015   BILITOT 0.48 04/04/2014   ALKPHOS 42 10/16/2015   ALKPHOS 45 04/04/2014   AST 19 10/16/2015   AST 14 04/04/2014   ALT 14* 10/16/2015   ALT 10 04/04/2014  . Lab Results  Component Value Date   INR 1.27 10/20/2015   INR 1.24 10/19/2015   INR 1.67* 10/18/2015   PROTIME 21.6* 05/19/2015   PROTIME 24.0* 04/25/2015   PROTIME 13.2 04/17/2015    Total Time in preparing paper work, data evaluation and todays exam - 35 minutes  Thurnell Lose M.D on 10/20/2015 at 10:03 AM  [facility]   Office  [phone]

## 2015-10-20 NOTE — Progress Notes (Signed)
Pt discharged home with wife in stable condition 

## 2015-10-20 NOTE — Progress Notes (Signed)
Pt scheduled for discharge home this pm. Discharge instructions provided with no concerns voiced

## 2015-10-20 NOTE — Progress Notes (Signed)
POST OPERATIVE NOTE:  10/20/2015 [name] [name] [id number]  VITALS: BP 158/78 mmHg  Pulse 73  Temp(Src) 98.7 F (37.1 C) (Oral)  Resp 16  Ht 6\' 4"  (1.93 m)  Wt 223 lb 12.3 oz (101.5 kg)  BMI 27.25 kg/m2  SpO2 100%  LABS:  Lab Results  Component Value Date   WBC 5.7 10/19/2015   HGB 12.4* 10/19/2015   HCT 37.4* 10/19/2015   MCV 70.0* 10/19/2015   PLT 288 10/19/2015   BMET    Component Value Date/Time   NA 138 10/20/2015 0430   NA 143 04/04/2014 1116   K 4.3 10/20/2015 0430   K 3.7 04/04/2014 1116   CL 101 10/20/2015 0430   CO2 31 10/20/2015 0430   CO2 30* 04/04/2014 1116   GLUCOSE 96 10/20/2015 0430   GLUCOSE 89 04/04/2014 1116   BUN 7 10/20/2015 0430   BUN 10.1 04/04/2014 1116   CREATININE 0.89 10/20/2015 0430   CREATININE 1.0 04/04/2014 1116   CALCIUM 9.4 10/20/2015 0430   CALCIUM 9.3 04/04/2014 1116   GFRNONAA >60 10/20/2015 0430   GFRAA >60 10/20/2015 0430    Lab Results  Component Value Date   INR 1.27 10/20/2015   INR 1.24 10/19/2015   INR 1.67* 10/18/2015   PROTIME 21.6* 05/19/2015   PROTIME 24.0* 04/25/2015   PROTIME 13.2 04/17/2015   No results found for: PTT   [name] [name] is status post extraction of multiple teeth with alveoloplasty and pre-or general anesthesia 10/19/2015. Patient was informed of the extraction of tooth numbers 2, 12, and 13. Tooth #13 and was added to the extraction list secondary to extensive dental caries on the mesial. Tooth #16 was left as is for follow-up with an oral surgeon for extraction as indicated. Patient expressed understanding.  SUBJECTIVE: Patient with minimal discomfort. Patient denies having any active bleeding. Patient indicates that he has a loose suture involving the upper right quadrant.   EXAM: There is no sign of infection, heme, or ooze. Upper right suture is loose and hanging. Upper left sutures are intact. Clots are present.  PROCEDURE: Upper right quadrant suture that was dangling was  removed with suture scissors without complications. Patient tolerated the procedure well.   ASSESSMENT: Post operative course is consistent with dental procedures performed in the operating room.   PLAN: 1. Start gentle salt water rinses every 2 hours while awake to aid healing. 2. Advance diet as tolerated. 3. Return to dental medicine for suture removal in 7-10 days. 4. Follow-up with a primary dentist of his choice for exam, radiographs, and discussion of other dental treatment needs. 5. Okay for discharge from a dental standpoint. Continue oral antibiotic therapy per hospitalist for an additional 5 days. Pain medication per hospitalist as well.   Lenn Cal, DDS

## 2015-10-20 NOTE — Care Management (Addendum)
Patient discharging on Lovenox 150 mg/ml every 12 hours Qty 10  Patient states he has 3 syringes at home already and he thinks his co pay last time was $3.  Offered to call prescription in to his pharmacy and check co pay . Patient consent . His pharmacy is [facility] on [address]  [phone]. Called same spoke with Jan . Co pay will be $3.30 . Patient aware. Magdalen Spatz RN BSN [phone]

## 2015-10-30 ENCOUNTER — Encounter (HOSPITAL_COMMUNITY): Payer: Self-pay | Admitting: Dentistry

## 2015-10-30 ENCOUNTER — Encounter (INDEPENDENT_AMBULATORY_CARE_PROVIDER_SITE_OTHER): Payer: Self-pay

## 2015-10-30 ENCOUNTER — Ambulatory Visit (INDEPENDENT_AMBULATORY_CARE_PROVIDER_SITE_OTHER): Payer: Medicare Other | Admitting: Pharmacist

## 2015-10-30 ENCOUNTER — Ambulatory Visit (HOSPITAL_COMMUNITY): Payer: Self-pay | Admitting: Dentistry

## 2015-10-30 VITALS — BP 164/90 | HR 92 | Temp 97.5°F

## 2015-10-30 DIAGNOSIS — K08409 Partial loss of teeth, unspecified cause, unspecified class: Secondary | ICD-10-CM

## 2015-10-30 DIAGNOSIS — Z7901 Long term (current) use of anticoagulants: Secondary | ICD-10-CM | POA: Diagnosis not present

## 2015-10-30 DIAGNOSIS — R220 Localized swelling, mass and lump, head: Secondary | ICD-10-CM

## 2015-10-30 LAB — POCT INR: INR: 1.1

## 2015-10-30 NOTE — Progress Notes (Signed)
Reviewed Thanks DrG 

## 2015-10-30 NOTE — Progress Notes (Signed)
Anti-Coagulation Progress Note  [name] [name] is a 65 y.o. male who is currently on an anti-coagulation regimen.    RECENT RESULTS: Recent results are below, the most recent result is correlated with a dose of 60 mg. per week with concomitant Lovneox injection ONCE-DAILY (patient weighs 168kg--am providing him free syringes of the 150mg  strength syringe which is providing 1.34mg /kg) Lot [id number]. EXPIRES 09/2016. #5 syringes provided to patient for CONTINUED Lovenox Bridge until such time INR > 2.0: Lab Results  Component Value Date   INR 1.1 10/30/2015   INR 1.27 10/20/2015   INR 1.24 10/19/2015   PROTIME 21.6* 05/19/2015    ANTI-COAG DOSE: Anticoagulation Dose Instructions as of 10/30/2015      [name] [name] Tue Wed Thu Fri Sat   New Dose 10 mg 10 mg 10 mg 10 mg 10 mg 10 mg 10 mg    Description        Take 2 x 5mg  (10mg ) warfarin by mouth daily at 6PM. Inject yourself with Lovenox syringes you have. Continue ONCE-DAILY injection at same time you take your warfarin.        ANTICOAG SUMMARY: Anticoagulation Episode Summary    Current INR goal 2.0-3.0   Next INR check 11/06/2015   INR from last check 1.1! (10/30/2015)   Weekly max dose    Target end date    INR check location    Preferred lab    Send INR reminders to [facility]      Comments       Anticoagulation Care Providers    Provider Role Specialty Phone number   [name], MD Responsible Oncology [phone]      ANTICOAG TODAY: Anticoagulation Summary as of 10/30/2015    INR goal 2.0-3.0   Selected INR 1.1! (10/30/2015)   Next INR check 11/06/2015   Target end date     Anticoagulation Episode Summary    INR check location    Preferred lab    Send INR reminders to [facility] Providers    Provider Role Specialty Phone number   [name], MD Responsible Oncology [phone]      PATIENT INSTRUCTIONS: Patient Instructions  Patient  instructed to take medications as defined in the Anti-coagulation Track section of this encounter.  Patient instructed to take today's dose.  Patient instructed to CONTINUE ONCE-DAILY Lovenox shots at same time you take your warfarin.  Patient verbalized understanding of these instructions.       FOLLOW-UP Return in about 7 days (around 11/06/2015) for Follow up INR at 1130h.  Jorene Guest, III Pharm.D., CACP

## 2015-10-30 NOTE — Patient Instructions (Addendum)
PLAN: 1. Continue salt water rinses as needed aid healing. 2. Complete antibiotic therapy as prescribed. 3. Brush after meals and at bedtime. Floss at bedtime. 4. Patient will need to follow-up general dentist for an exam, radiographs, and discussion of other dental treatment needs. Patient is aware of the tooth #11 may require root canal therapy or extraction procedure if pain and discomfort persists.   Lenn Cal, DDS

## 2015-10-30 NOTE — Progress Notes (Signed)
POST OPERATIVE NOTE:  10/30/2015 [name] [name] [id number]  VITALS: BP 164/90 mmHg  Pulse 92  Temp(Src) 97.5 F (36.4 C) (Oral)  LABS:  Lab Results  Component Value Date   WBC 5.7 10/19/2015   HGB 12.4* 10/19/2015   HCT 37.4* 10/19/2015   MCV 70.0* 10/19/2015   PLT 288 10/19/2015   BMET    Component Value Date/Time   NA 138 10/20/2015 0430   NA 143 04/04/2014 1116   K 4.3 10/20/2015 0430   K 3.7 04/04/2014 1116   CL 101 10/20/2015 0430   CO2 31 10/20/2015 0430   CO2 30* 04/04/2014 1116   GLUCOSE 96 10/20/2015 0430   GLUCOSE 89 04/04/2014 1116   BUN 7 10/20/2015 0430   BUN 10.1 04/04/2014 1116   CREATININE 0.89 10/20/2015 0430   CREATININE 1.0 04/04/2014 1116   CALCIUM 9.4 10/20/2015 0430   CALCIUM 9.3 04/04/2014 1116   GFRNONAA >60 10/20/2015 0430   GFRAA >60 10/20/2015 0430    Lab Results  Component Value Date   INR 1.27 10/20/2015   INR 1.24 10/19/2015   INR 1.67* 10/18/2015   PROTIME 21.6* 05/19/2015   PROTIME 24.0* 04/25/2015   PROTIME 13.2 04/17/2015   No results found for: PTT   [name] [name] is status post extraction of tooth numbers 2, 12, 13 with alveoloplasty and gross debridement of remaining dentition in the OR with general anesthesia on 10/19/2015.  The patient now presents for evaluation of healing and suture removal. Patient is still taking his antibiotic therapy and was instructed to complete therapy as prescribed. Patient to follow-up with [name] today for evaluation and discussion of warfarin/Lovenox therapies.   SUBJECTIVE: Patient with some discomfort involving the upper left dental extraction site. Patient denies having any active bleeding. Patient is using salt water rinses as needed.   EXAM: No sign of infection, heme, or ooze. Extraction site #2 is healing in by secondary intention. Extraction site area #12-13 is healing in by generalized primary closure. No sutures remain. No evidence of intraoral abscess.  Tooth #11 is  slightly sensitive to percussion at this time.  PROCEDURE: The patient was given a chlorhexidine gluconate rinse for 30 seconds. No sutures needed to be removed.  ASSESSMENT: Post operative course is consistent with dental procedures performed in the OR.  PLAN: 1. Continue salt water rinses as needed aid healing. 2. Complete antibiotic therapy as prescribed. 3. Brush after meals and at bedtime. Floss at bedtime. 4. Patient will need to follow-up general dentist for an exam, radiographs, and discussion of other dental treatment needs. Patient is aware of the tooth #11 may require root canal therapy or extraction procedure if pain and discomfort persists.   Lenn Cal, DDS

## 2015-10-30 NOTE — Patient Instructions (Signed)
Patient instructed to take medications as defined in the Anti-coagulation Track section of this encounter.  Patient instructed to take today's dose.  Patient instructed to CONTINUE ONCE-DAILY Lovenox shots at same time you take your warfarin.  Patient verbalized understanding of these instructions.

## 2015-11-06 ENCOUNTER — Ambulatory Visit: Payer: Self-pay

## 2015-11-15 ENCOUNTER — Ambulatory Visit (INDEPENDENT_AMBULATORY_CARE_PROVIDER_SITE_OTHER): Payer: Medicare Other | Admitting: Pharmacist

## 2015-11-15 DIAGNOSIS — Z86718 Personal history of other venous thrombosis and embolism: Secondary | ICD-10-CM | POA: Diagnosis not present

## 2015-11-15 DIAGNOSIS — Z7901 Long term (current) use of anticoagulants: Secondary | ICD-10-CM

## 2015-11-15 DIAGNOSIS — I2699 Other pulmonary embolism without acute cor pulmonale: Secondary | ICD-10-CM | POA: Diagnosis not present

## 2015-11-15 LAB — POCT INR: INR: 1.2

## 2015-11-15 MED ORDER — WARFARIN SODIUM 5 MG PO TABS: 10.0000 mg | ORAL_TABLET | Freq: Every day | ORAL | Status: DC

## 2015-11-15 MED ORDER — ENOXAPARIN SODIUM 150 MG/ML ~~LOC~~ SOLN: 150.0000 mg | SUBCUTANEOUS | Status: DC

## 2015-11-15 NOTE — Progress Notes (Addendum)
Anticoagulation Management [name] [name] is a 65 y.o. male who reports to the clinic for monitoring of warfarin treatment.    Indication: DVT and PE Duration: indefinite  Anticoagulation Clinic Visit History: Patient does not report signs/symptoms of bleeding or thromboembolism  Other recent changes: Increased dietary vitamin K (cabbage). Reports potentially missing a dose or two of warfarin in the past week due to stress from daughters being ill. Also noted misunderstanding of warfarin regimen change from last visit as patient continued to take 7.5mg  doses. Anticoagulation Episode Summary    Current INR goal 2.0-3.0   Next INR check 11/20/2015   INR from last check 1.2! (11/15/2015)   Weekly max dose    Target end date    INR check location    Preferred lab    Send INR reminders to [facility]      Comments       Anticoagulation Care Providers    Provider Role Specialty Phone number   [name], MD Responsible Oncology [phone]     ASSESSMENT Recent Results: The most recent result is correlated with approximately 52.5mg  per week: Lab Results  Component Value Date   INR 1.2 11/15/2015   INR 1.1 10/30/2015   INR 1.27 10/20/2015   PROTIME 21.6* 05/19/2015    Anticoagulation Dosing: INR as of 11/15/2015 and Previous Dosing Information    INR Dt INR Goal Wkly Tot Sun Mon Tue Wed Thu Fri Sat   11/15/2015 1.2 2.0-3.0 70 mg 10 mg 10 mg 10 mg 10 mg 10 mg 10 mg 10 mg    Previous description        Take 2 x 5mg  (10mg ) warfarin by mouth daily at 6PM. Inject yourself with Lovenox syringes you have. Continue ONCE-DAILY injection at same time you take your warfarin.     Anticoagulation Dose Instructions as of 11/15/2015      Total Sun Mon Tue Wed Thu Fri Sat   New Dose 70 mg 10 mg 10 mg 10 mg 10 mg 10 mg 10 mg 10 mg     (5 mg x 2)  (5 mg x 2)  (5 mg x 2)  (5 mg x 2)  (5 mg x 2)  (5 mg x 2)  (5 mg x 2)                         Description        Take 2 x  5mg  (10mg ) warfarin by mouth daily at 6PM. Inject yourself with Lovenox syringes you have. Continue ONCE-DAILY injection at same time you take your warfarin.       INR today: Subtherapeutic  PLAN Weekly dose was unchanged. Pt reports recent increase in dietary vitamin K, 1-2 missed doses, and non-adherence to last regimen change. Will continue at most recent prescribed dose of 10mg  daily and bridge with lovenox 150mg  SQ daily.   Patient reported grieving and anxiety due to daughter passing away. He stated he is interested in non-pharm suggestions and that wife uses lavender at home. Aromatherapy sample has been provided to the patient and his consent was obtained.   Essential oil given: peace blend   Qty: 1 drop Exp.Date: March 2018  The patient has been instructed regarding safe and effective use of essential oils:  Gentle inhalation only  Do not swallow, apply to skin, or get near eyes  Avoid exposure to children, pets, or individuals who may be sensitive.  Patient  verbalized understanding by repeating back concepts discussed and signed consent sheet for verification.  Kim,Jennifer J CH[facility] Approved Aromatherapy Clinician 8:22 AM 11/16/2015   Patient Instructions  DoseResponse handout given.  Patient advised to contact clinic or seek medical attention if signs/symptoms of bleeding or thromboembolism occur.  Patient verbalized understanding by repeating back information and was advised to contact me if further medication-related questions arise. Patient was also provided an information handout.  Follow-up Return in 5 days (on 11/20/2015) for Follow up INR check.  Judieth Keens, PharmD. Clinical Pharmacy Resident  30 minutes spent face-to-face with the patient during the encounter. 80% of time spent on education. 20% of time was spent on INR assessment.

## 2015-11-15 NOTE — Progress Notes (Signed)
Reviewed Thanks DrG 

## 2015-11-15 NOTE — Patient Instructions (Signed)
DoseResponse handout given.

## 2015-11-16 ENCOUNTER — Other Ambulatory Visit: Payer: Self-pay | Admitting: Oncology

## 2015-11-16 DIAGNOSIS — Z86718 Personal history of other venous thrombosis and embolism: Secondary | ICD-10-CM

## 2015-11-16 NOTE — Telephone Encounter (Signed)
done

## 2015-11-16 NOTE — Telephone Encounter (Signed)
Pt requesting Lovenox to be filled.

## 2015-11-20 ENCOUNTER — Ambulatory Visit: Payer: Self-pay

## 2015-12-04 ENCOUNTER — Ambulatory Visit (INDEPENDENT_AMBULATORY_CARE_PROVIDER_SITE_OTHER): Payer: Medicare Other | Admitting: Pharmacist

## 2015-12-04 DIAGNOSIS — Z7901 Long term (current) use of anticoagulants: Secondary | ICD-10-CM

## 2015-12-04 LAB — POCT INR: INR: 3

## 2015-12-04 NOTE — Progress Notes (Signed)
Anti-Coagulation Progress Note  [name] [name] is a 65 y.o. male who is currently on an anti-coagulation regimen.    RECENT RESULTS: Recent results are below, the most recent result is correlated with a dose of 70 mg. per week with LMWH bridge therapy that ended Thursday 23-MAR-17.  Lab Results  Component Value Date   INR 3.00 12/04/2015   INR 1.2 11/15/2015   INR 1.1 10/30/2015   PROTIME 21.6* 05/19/2015    ANTI-COAG DOSE: Anticoagulation Dose Instructions as of 12/04/2015      Dorene Grebe Tue Wed Thu Fri Sat   New Dose 10 mg 7.5 mg 10 mg 10 mg 7.5 mg 10 mg 10 mg       ANTICOAG SUMMARY: Anticoagulation Episode Summary    Current INR goal 2.0-3.0   Next INR check 12/25/2015   INR from last check 3.00 (12/04/2015)   Weekly max dose    Target end date    INR check location    Preferred lab    Send INR reminders to [facility]      Comments       Anticoagulation Care Providers    Provider Role Specialty Phone number   [name], MD Responsible Oncology [phone]      ANTICOAG TODAY: Anticoagulation Summary as of 12/04/2015    INR goal 2.0-3.0   Selected INR 3.00 (12/04/2015)   Next INR check 12/25/2015   Target end date     Anticoagulation Episode Summary    INR check location    Preferred lab    Send INR reminders to [facility] Providers    Provider Role Specialty Phone number   [name], MD Responsible Oncology [phone]      PATIENT INSTRUCTIONS: Patient Instructions  Patient instructed to take medications as defined in the Anti-coagulation Track section of this encounter.  Patient instructed to take today's dose.  Patient verbalized understanding of these instructions.       FOLLOW-UP Return in 3 weeks (on 12/25/2015) for Follow up INR at [place], III Pharm.D., CACP

## 2015-12-04 NOTE — Patient Instructions (Signed)
Patient instructed to take medications as defined in the Anti-coagulation Track section of this encounter.  Patient instructed to take today's dose.  Patient verbalized understanding of these instructions.    

## 2015-12-06 NOTE — Progress Notes (Signed)
I have reviewed Dr. Gladstone Pih note.  Patient is on [facility] for VTE.  INR at goal, he is being bridged with lovenox.

## 2015-12-25 ENCOUNTER — Ambulatory Visit (INDEPENDENT_AMBULATORY_CARE_PROVIDER_SITE_OTHER): Payer: Medicare Other | Admitting: Pharmacist

## 2015-12-25 DIAGNOSIS — Z7901 Long term (current) use of anticoagulants: Secondary | ICD-10-CM

## 2015-12-25 DIAGNOSIS — Z86718 Personal history of other venous thrombosis and embolism: Secondary | ICD-10-CM

## 2015-12-25 LAB — POCT INR: INR: 2

## 2015-12-25 NOTE — Progress Notes (Signed)
Anti-Coagulation Progress Note  [name] [name] is a 66 y.o. male who is currently on an anti-coagulation regimen.    RECENT RESULTS: Recent results are below, the most recent result is correlated with a dose of 65 mg. per week: Lab Results  Component Value Date   INR 2.00 12/25/2015   INR 3.00 12/04/2015   INR 1.2 11/15/2015   PROTIME 21.6* 05/19/2015    ANTI-COAG DOSE: Anticoagulation Dose Instructions as of 12/25/2015      Dorene Grebe Tue Wed Thu Fri Sat   New Dose 10 mg 10 mg 10 mg 10 mg 7.5 mg 10 mg 10 mg       ANTICOAG SUMMARY: Anticoagulation Episode Summary    Current INR goal 2.0-3.0   Next INR check 01/22/2016   INR from last check 2.00 (12/25/2015)   Weekly max dose    Target end date    INR check location    Preferred lab    Send INR reminders to [facility]      Comments       Anticoagulation Care Providers    Provider Role Specialty Phone number   [name], MD Responsible Oncology [phone]      ANTICOAG TODAY: Anticoagulation Summary as of 12/25/2015    INR goal 2.0-3.0   Selected INR 2.00 (12/25/2015)   Next INR check 01/22/2016   Target end date     Anticoagulation Episode Summary    INR check location    Preferred lab    Send INR reminders to [facility] Providers    Provider Role Specialty Phone number   [name], MD Responsible Oncology [phone]      PATIENT INSTRUCTIONS: Patient Instructions  Patient instructed to take medications as defined in the Anti-coagulation Track section of this encounter.  Patient instructed to take today's dose.  Patient verbalized understanding of these instructions.       FOLLOW-UP Return in 4 weeks (on 01/22/2016) for Follow up INR at 2:45PM.  Jorene Guest, III Pharm.D., CACP

## 2015-12-25 NOTE — Progress Notes (Signed)
Reviewed thanks DrG 

## 2015-12-25 NOTE — Patient Instructions (Signed)
Patient instructed to take medications as defined in the Anti-coagulation Track section of this encounter.  Patient instructed to take today's dose.  Patient verbalized understanding of these instructions.    

## 2016-01-04 ENCOUNTER — Encounter: Payer: Self-pay | Admitting: Neurology

## 2016-01-04 ENCOUNTER — Ambulatory Visit (INDEPENDENT_AMBULATORY_CARE_PROVIDER_SITE_OTHER): Payer: Medicare Other | Admitting: Neurology

## 2016-01-04 VITALS — BP 142/78 | HR 104 | Ht 76.0 in | Wt 254.0 lb

## 2016-01-04 DIAGNOSIS — G959 Disease of spinal cord, unspecified: Secondary | ICD-10-CM | POA: Diagnosis not present

## 2016-01-04 DIAGNOSIS — M4802 Spinal stenosis, cervical region: Secondary | ICD-10-CM | POA: Diagnosis not present

## 2016-01-04 DIAGNOSIS — M792 Neuralgia and neuritis, unspecified: Secondary | ICD-10-CM

## 2016-01-04 NOTE — Patient Instructions (Signed)
1.  Follow-up with Dr. Louanne Skye 2.  Encouraged to stop smoking

## 2016-01-04 NOTE — Progress Notes (Signed)
[facility] Clinic Note - Initial Visit   Date: 01/04/2016  [name] [name] MRN: [id number] DOB: May 01, 1951   Dear Dr. Jonelle Sidle:  Thank you for your kind referral of [name] [name] for consultation of left arm pain. Although his history is well known to you, please allow [place] to reiterate it for the purpose of our medical record. The patient was accompanied to the clinic by wife who also provides collateral information.     History of Present Illness: [name] [name] is a 65 y.o. left-handed African American male with hypertension, gout, PE on anticoagulation, depression, anxiety, s/p sacral nerve stimulator, and current tobacco use presenting for evaluation of left chest and arm pain.    During the summer of 2016, he wake up one morning with blisters over his chest and later developed severe sharp pain over his anterior chest on the left and radiates under his arm and into his shoulder.  There is exacerbation of pain with light touch or pressure.  He was treated for post-herpetic neuralgia with a number of various oral medications and topical ointments without significant relief.  He has noticed that pouring rubbing alcohol over the skin provides the most relief.  Around the same time, he also began having severe sharp pain radiating down his forearm and into his hand.  He has numbness and tingling over the entire arm.  He is severely limited by his pain and is unable to do things such as wash his car, gardening, etc.  He has tried Lyrica, gabapentin, nortriptyline, amitriptyline, and other with no relief.  He takes Cymbalta 120mg  daily which helps. 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 Louanne Skye who recommended surgery.  Patient did not want surgery and opted for conservative therapies, so he was see by Pain Management.  Pain management recommended for physical therapy, but  he did not wish to do this and was referred for a second opinion here.  He denies any bowel/bladder incontinence.  He endorses severe shooting pain down his neck and back with neck flexion.   He is unable to get MRI because of sacral nerve stimulator.   Out-side paper records, electronic medical record, and images have been reviewed where available and summarized as:  Cervical myelogram 08/06/2011: There is significant central stenosis at C4-5 with indention of osteophytes into the right side of the cord.  Significant central stenosis at C6-7 secondary to a disc protrusion and osteophyte complex without cord compression.  Past Medical History  Diagnosis Date  . Hypertension   . History of blood clots   . Diverticulitis   . Hernia   . Diverticulosis   . Shingles   . Gout   . Back pain   . Neuropathy, peripheral ([place]) 05/15/2012  . DVT (deep venous thrombosis) ([place])   . Anxiety and depression   . Nausea and vomiting     Past Surgical History  Procedure Laterality Date  . Back surgery    . Hernia repair    . Hiatal hernia repair    . Appendectomy    . Cholecystectomy  2008  . Sacral nerve stimulator placement  2011  . Left heart catheterization with coronary angiogram N/A 08/02/2014    Procedure: LEFT HEART CATHETERIZATION WITH CORONARY ANGIOGRAM;  Surgeon: Laverda Page, MD;  Location: [facility] CATH LAB;  Service: Cardiovascular;  Laterality: N/A;  . Multiple extractions with alveoloplasty  N/A 10/19/2015    Procedure: Extraction of tooth #'s 2,12,13 with alveoloplasty;  Surgeon: Lenn Cal, DDS;  Location: [facility];  Service: Oral Surgery;  Laterality: N/A;     Medications:  Outpatient Encounter Prescriptions as of 01/04/2016  Medication Sig Note  . allopurinol (ZYLOPRIM) 300 MG tablet Take 300 mg by mouth daily.   . diazepam (VALIUM) 5 MG tablet Take 5 mg by mouth 3 (three) times daily.   . DULoxetine (CYMBALTA) 60 MG capsule Take 120 mg by mouth daily.    .  hydrochlorothiazide (HYDRODIURIL) 25 MG tablet Take 25 mg by mouth daily.   . metoprolol tartrate (LOPRESSOR) 25 MG tablet Take 25 mg by mouth 2 (two) times daily. 10/16/2015: -  . NIFEDICAL XL 60 MG 24 hr tablet Take 120 mg by mouth daily.  07/26/2014: -  . ondansetron (ZOFRAN) 4 MG tablet Take 1 tablet (4 mg total) by mouth every 8 (eight) hours as needed for nausea or vomiting.   Marland Kitchen oxycodone-acetaminophen (PERCOCET) 2.5-325 MG tablet Take 1 tablet by mouth 3 (three) times daily.   Marland Kitchen PROAIR HFA 108 (90 BASE) MCG/ACT inhaler Inhale 1 puff into the lungs every 6 (six) hours as needed for wheezing.  07/26/2014: -  . tiZANidine (ZANAFLEX) 4 MG tablet Take 4 mg by mouth every 6 (six) hours as needed for muscle spasms.  10/16/2015: -  . valsartan-hydrochlorothiazide (DIOVAN-HCT) 320-25 MG per tablet Take 1 tablet by mouth daily.   . VOLTAREN 1 % GEL Apply 2 g topically daily as needed. For neck and shoulder pain 07/26/2014: -  . warfarin (COUMADIN) 5 MG tablet Take 2 tablets (10 mg total) by mouth daily. Take two of the 5mg  tablets (total of 10mg ) daily   . [DISCONTINUED] clindamycin (CLEOCIN) 300 MG capsule Take 1 capsule (300 mg total) by mouth 4 (four) times daily.   . [DISCONTINUED] enoxaparin (LOVENOX) 150 MG/ML injection Inject 1 mL (150 mg total) into the skin daily. Stop once INR reaches 2   . [DISCONTINUED] glycopyrrolate (ROBINUL) 2 MG tablet Take 1 tablet (2 mg total) by mouth 2 (two) times daily.   . [DISCONTINUED] oxyCODONE-acetaminophen (PERCOCET) 10-325 MG tablet Take 1 tablet by mouth every 4 (four) hours as needed for pain.    No facility-administered encounter medications on file as of 01/04/2016.     Allergies:  Allergies  Allergen Reactions  . Aspirin Other (See Comments)    ulcers  . Celecoxib Other (See Comments)    Stomach irritation  . Ibuprofen Other (See Comments)    ulcers  . Lyrica [Pregabalin] Swelling  . Vicodin [Hydrocodone-Acetaminophen]     Makes me mean      Family History: Family History  Problem Relation Age of Onset  . Prostate cancer Father   . Heart disease Father   . Diabetes Mother   . Heart disease Mother   . Colon cancer Maternal Uncle     dx in his 55's  . Pancreatic cancer Paternal Uncle   . Prostate cancer Paternal Uncle   . Multiple sclerosis Daughter   . Prostate cancer Paternal Uncle     Social History: Social History  Substance Use Topics  . Smoking status: Current Every Day Smoker -- 0.50 packs/day for 40 years    Types: Cigarettes  . Smokeless tobacco: Never Used     Comment: sometimes less.  . Alcohol Use: 0.0 oz/week    0 Standard drinks or equivalent per week     Comment: Occasional  Social History   Social History Narrative   Patient lives at home with his family.   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    Caffeine use: 1-2 cups daily    Review of Systems:  CONSTITUTIONAL: No fevers, chills, night sweats, or weight loss.   EYES: No visual changes or eye pain ENT: No hearing changes.  No history of nose bleeds.   RESPIRATORY: No cough, wheezing and shortness of breath.   CARDIOVASCULAR: Negative for chest pain, and palpitations.   GI: Negative for abdominal discomfort, blood in stools or black stools.  No recent change in bowel habits.   GU:  No history of incontinence.   MUSCLOSKELETAL: +history of joint pain or swelling.  No myalgias.   SKIN: Negative for lesions, rash, and itching.   HEMATOLOGY/ONCOLOGY: Negative for prolonged bleeding, bruising easily, and swollen nodes.  No history of cancer.   ENDOCRINE: Negative for cold or heat intolerance, polydipsia or goiter.   PSYCH:  +depression or anxiety symptoms.   NEURO: As Above.   Vital Signs:  BP 142/78 mmHg  Pulse 104  Ht 6\' 4"  (1.93 m)  Wt 254 lb (115.214 kg)  BMI 30.93 kg/m2  SpO2 97%   General Medical Exam:   General:  Well appearing, comfortable.   Eyes/ENT: see cranial nerve examination.   Neck: No  masses appreciated.  Full range of motion without tenderness.  No carotid bruits.Negative Lhermitte's sign Respiratory:  Clear to auscultation, good air entry bilaterally.   Cardiac:  Regular rate and rhythm, no murmur.   Extremities:  No deformities, edema, or skin discoloration.  Skin:  No rashes or lesions.  Neurological Exam: MENTAL STATUS including orientation to time, place, person, recent and remote memory, attention span and concentration, language, and fund of knowledge is normal.  Speech is not dysarthric.  CRANIAL NERVES: II:  No visual field defects.  Unremarkable fundi.   III-IV-VI: Pupils equal round and reactive to light.  Normal conjugate, extra-ocular eye movements in all directions of gaze.  No nystagmus.  No ptosis.   V:  Normal facial sensation.     VII:  Normal facial symmetry and movements.   VIII:  Normal hearing and vestibular function.   IX-X:  Normal palatal movement.   XI:  Normal shoulder shrug and head rotation.   XII:  Normal tongue strength and range of motion, no deviation or fasciculation.  MOTOR:  No atrophy, fasciculations or abnormal movements.  No pronator drift.  Tone is normal.    Right Upper Extremity:    Left Upper Extremity:    Deltoid  5/5   Deltoid  5/5   Biceps  5/5   Biceps  5/5   Triceps  5/5   Triceps  5/5   Wrist extensors  5/5   Wrist extensors  5-/5   Wrist flexors  5/5   Wrist flexors  5-/5   Finger extensors  5/5   Finger extensors  5-/5   Finger flexors  5/5   Finger flexors  5-/5   Dorsal interossei  5/5   Dorsal interossei  5-/5   Abductor pollicis  5/5   Abductor pollicis  5-/5   Tone (Ashworth scale)  0  Tone (Ashworth scale)  0+   Right Lower Extremity:    Left Lower Extremity:    Hip flexors  5/5   Hip flexors  5/5   Hip extensors  5/5   Hip extensors  5/5   Knee  flexors  5/5   Knee flexors  5/5   Knee extensors  5/5   Knee extensors  5/5   Dorsiflexors  5/5   Dorsiflexors  5/5   Plantarflexors  5/5   Plantarflexors   5/5   Toe extensors  5/5   Toe extensors  5/5   Toe flexors  5/5   Toe flexors  5/5   Tone (Ashworth scale)  0  Tone (Ashworth scale)  0   MSRs:  Right                                                                 Left brachioradialis 3+  brachioradialis 3+  biceps 3+  biceps 3+  triceps 3+  triceps 3+  patellar 3+  patellar 3+  ankle jerk 2+  ankle jerk 2+  Hoffman no  Hoffman no  plantar response down  plantar response down   SENSORY:  Sensation to all modalities is reduced over the entire left upper extremity, there is no sensory level. Vibration reduced distally in the ankles.    COORDINATION/GAIT: Normal finger-to- nose-finge.  Intact rapid alternating movements bilaterally.  Gait is slow and stable.    IMPRESSION: 1.  Severe central canal stenosis causing myelopathy and worsening neck pain, paresthesias and weakness.  I reiterated management options which has been previously discussed by his providers and agree that surgery is indicated so he will f/u with Dr. Louanne Skye 2.  Post-herpetic neuralgia, he unfortunately has tried a number of medications without benefit.  He is currently taking Cymbalta 120mg  daily which provides some relief.  He does not wish to try anything else.  Consider carbamazepine going forward.  3.  Tobacco use.  Smoking cessation instruction/counseling given:  counseled patient on the dangers of tobacco use, advised patient to stop smoking, and reviewed strategies to maximize success  Return to clinic as needed  The duration of this appointment visit was 50 minutes of face-to-face time with the patient.  Greater than 50% of this time was spent in counseling, explanation of diagnosis, planning of further management, and coordination of care.   Thank you for allowing me to participate in patient's care.  If I can answer any additional questions, I would be pleased to do so.    Sincerely,    Kemauri Musa K. Posey Pronto, DO

## 2016-01-05 NOTE — Progress Notes (Signed)
Note routed

## 2016-01-22 ENCOUNTER — Ambulatory Visit (INDEPENDENT_AMBULATORY_CARE_PROVIDER_SITE_OTHER): Payer: Medicare Other | Admitting: Pharmacist

## 2016-01-22 DIAGNOSIS — Z7901 Long term (current) use of anticoagulants: Secondary | ICD-10-CM

## 2016-01-22 LAB — POCT INR: INR: 2

## 2016-01-22 NOTE — Progress Notes (Signed)
Anti-Coagulation Progress Note  [name] [name] is a 65 y.o. male who is currently on an anti-coagulation regimen.    RECENT RESULTS: Recent results are below, the most recent result is correlated with a dose of 67.5 mg. per week: Lab Results  Component Value Date   INR 2.00 01/22/2016   INR 2.00 12/25/2015   INR 3.00 12/04/2015   PROTIME 21.6* 05/19/2015    ANTI-COAG DOSE: Anticoagulation Dose Instructions as of 01/22/2016      Dorene Grebe Tue Wed Thu Fri Sat   New Dose 10 mg 12.5 mg 10 mg 10 mg 12.5 mg 10 mg 10 mg       ANTICOAG SUMMARY: Anticoagulation Episode Summary    Current INR goal 2.0-3.0   Next INR check 02/12/2016   INR from last check 2.00 (01/22/2016)   Weekly max dose    Target end date    INR check location    Preferred lab    Send INR reminders to [place]      Comments       Anticoagulation Care Providers    Provider Role Specialty Phone number   [name], MD Responsible Oncology [phone]      ANTICOAG TODAY: Anticoagulation Summary as of 01/22/2016    INR goal 2.0-3.0   Selected INR 2.00 (01/22/2016)   Next INR check 02/12/2016   Target end date     Anticoagulation Episode Summary    INR check location    Preferred lab    Send INR reminders to [place] Providers    Provider Role Specialty Phone number   [name], MD Responsible Oncology [phone]      PATIENT INSTRUCTIONS: Patient Instructions  Patient instructed to take medications as defined in the Anti-coagulation Track section of this encounter.  Patient instructed to take today's dose.  Patient verbalized understanding of these instructions.       FOLLOW-UP Return in 3 weeks (on 02/12/2016) for Follow up INR at 3:15PM.  Jorene Guest, III Pharm.D., CACP

## 2016-01-22 NOTE — Patient Instructions (Signed)
Patient instructed to take medications as defined in the Anti-coagulation Track section of this encounter.  Patient instructed to take today's dose.  Patient verbalized understanding of these instructions.    

## 2016-01-22 NOTE — Progress Notes (Signed)
Reviewed Thanks DrG 

## 2016-02-09 ENCOUNTER — Other Ambulatory Visit (HOSPITAL_COMMUNITY): Payer: Self-pay | Admitting: Specialist

## 2016-02-09 DIAGNOSIS — M25512 Pain in left shoulder: Secondary | ICD-10-CM

## 2016-02-12 ENCOUNTER — Ambulatory Visit: Payer: Self-pay

## 2016-02-14 ENCOUNTER — Other Ambulatory Visit: Payer: Self-pay | Admitting: Radiology

## 2016-02-19 ENCOUNTER — Ambulatory Visit (INDEPENDENT_AMBULATORY_CARE_PROVIDER_SITE_OTHER): Payer: Medicare Other | Admitting: Pharmacist

## 2016-02-19 ENCOUNTER — Other Ambulatory Visit: Payer: Self-pay | Admitting: Oncology

## 2016-02-19 DIAGNOSIS — Z7901 Long term (current) use of anticoagulants: Secondary | ICD-10-CM | POA: Diagnosis not present

## 2016-02-19 DIAGNOSIS — Z86718 Personal history of other venous thrombosis and embolism: Secondary | ICD-10-CM | POA: Diagnosis not present

## 2016-02-19 DIAGNOSIS — Z86711 Personal history of pulmonary embolism: Secondary | ICD-10-CM

## 2016-02-19 LAB — POCT INR: INR: 1.6

## 2016-02-19 NOTE — Progress Notes (Signed)
Anti-Coagulation Progress Note  [name] [name] is a 65 y.o. male who is currently on an anti-coagulation regimen.    RECENT RESULTS: Recent results are below, the most recent result is correlated with a dose of 75 mg. per week: Lab Results  Component Value Date   INR 1.60 02/19/2016   INR 2.00 01/22/2016   INR 2.00 12/25/2015   PROTIME 21.6* 05/19/2015    ANTI-COAG DOSE: Anticoagulation Dose Instructions as of 02/19/2016      Dorene Grebe Tue Wed Thu Fri Sat   New Dose 10 mg Hold Hold Hold Hold 10 mg 10 mg    Description        HOLD/OMIT doses of warfarin for 4 days beginning today Monday 12-June-17. Day AFTER your procedure, resume warfarin using dosing instructions provided from www.doseresponse.com (2x5mg  tablets on Mon/Wed/Fri; 2 and 1/2 tablets of your 5mg  strength warfarin on Sun/Tues/Thurs/Sat.        ANTICOAG SUMMARY: Anticoagulation Episode Summary    Current INR goal 2.0-3.0   Next INR check 03/04/2016   INR from last check 1.60! (02/19/2016)   Weekly max dose    Target end date    INR check location    Preferred lab    Send INR reminders to [place]      Comments       Anticoagulation Care Providers    Provider Role Specialty Phone number   [name], MD Responsible Oncology [phone]      ANTICOAG TODAY: Anticoagulation Summary as of 02/19/2016    INR goal 2.0-3.0   Selected INR 1.60! (02/19/2016)   Next INR check 03/04/2016   Target end date     Anticoagulation Episode Summary    INR check location    Preferred lab    Send INR reminders to [place] Providers    Provider Role Specialty Phone number   [name], MD Responsible Oncology [phone]      PATIENT INSTRUCTIONS: Patient Instructions  Patient instructed to take medications as defined in the Anti-coagulation Track section of this encounter.  Patient instructed to OMIT today's dose and doses for a total of 4  days--commencing today. RESUME warfarin based upon www.doseresponse instruction sheet the day following your shoulder procedure by Dr. Louanne Skye.  Patient verbalized understanding of these instructions.       FOLLOW-UP Return in 2 weeks (on 03/04/2016) for Follow up INR at 2:15PM.  Jorene Guest, III Pharm.D., CACP

## 2016-02-19 NOTE — Patient Instructions (Signed)
Patient instructed to take medications as defined in the Anti-coagulation Track section of this encounter.  Patient instructed to OMIT today's dose and doses for a total of 4 days--commencing today. RESUME warfarin based upon www.doseresponse instruction sheet the day following your shoulder procedure by Dr. Louanne Skye.  Patient verbalized understanding of these instructions.

## 2016-02-19 NOTE — Progress Notes (Signed)
INTERNAL MEDICINE TEACHING ATTENDING ADDENDUM - Aldine Contes M.D  Duration- indefinite, Indication- PE, DVT, INR- subtherapeutic. Agree with pharmacy recommendations as outlined in their note. Patient's warfarin on hold for surgical procedure at current time

## 2016-02-22 ENCOUNTER — Ambulatory Visit (HOSPITAL_COMMUNITY)
Admission: RE | Admit: 2016-02-22 | Discharge: 2016-02-22 | Disposition: A | Discharge status: Home / Self Care | Payer: Medicare Other | Source: Ambulatory Visit | Attending: Specialist | Admitting: Specialist

## 2016-02-22 DIAGNOSIS — M25512 Pain in left shoulder: Secondary | ICD-10-CM | POA: Diagnosis present

## 2016-02-22 LAB — PROTIME-INR
INR: 1.13 (ref 0.00–1.49)
Prothrombin Time: 14.6 seconds (ref 11.6–15.2)

## 2016-02-22 MED ORDER — LIDOCAINE HCL (PF) 1 % IJ SOLN
INTRAMUSCULAR | Status: AC
  Administered 2016-02-22: 10 mL via INTRADERMAL
  Filled 2016-02-22: qty 10

## 2016-02-22 MED ORDER — IOPAMIDOL (ISOVUE-M 200) INJECTION 41%
INTRAMUSCULAR | Status: AC
  Administered 2016-02-22: 15 mL via INTRA_ARTICULAR
  Filled 2016-02-22: qty 10

## 2016-02-22 MED ORDER — LIDOCAINE HCL (PF) 1 % IJ SOLN
10.0000 mL | Freq: Once | INTRAMUSCULAR | Status: AC
  Administered 2016-02-22: 10 mL via INTRADERMAL

## 2016-02-22 MED ORDER — IOPAMIDOL (ISOVUE-M 200) INJECTION 41%
20.0000 mL | Freq: Once | INTRAMUSCULAR | Status: AC
  Administered 2016-02-22: 15 mL via INTRA_ARTICULAR

## 2016-02-25 ENCOUNTER — Encounter (HOSPITAL_COMMUNITY): Payer: Self-pay

## 2016-02-25 ENCOUNTER — Emergency Department (HOSPITAL_BASED_OUTPATIENT_CLINIC_OR_DEPARTMENT_OTHER)
Admission: RE | Admit: 2016-02-25 | Discharge: 2016-02-25 | Disposition: A | Discharge status: Home / Self Care | Payer: Medicare Other | Source: Ambulatory Visit | Attending: Emergency Medicine | Admitting: Emergency Medicine

## 2016-02-25 ENCOUNTER — Emergency Department (HOSPITAL_COMMUNITY): Payer: Medicare Other

## 2016-02-25 ENCOUNTER — Emergency Department (HOSPITAL_COMMUNITY)
Admission: EM | Admit: 2016-02-25 | Discharge: 2016-02-25 | Disposition: A | Discharge status: Home / Self Care | Payer: Medicare Other | Attending: Emergency Medicine | Admitting: Emergency Medicine

## 2016-02-25 DIAGNOSIS — Z79899 Other long term (current) drug therapy: Secondary | ICD-10-CM | POA: Insufficient documentation

## 2016-02-25 DIAGNOSIS — I82401 Acute embolism and thrombosis of unspecified deep veins of right lower extremity: Secondary | ICD-10-CM | POA: Insufficient documentation

## 2016-02-25 DIAGNOSIS — F17210 Nicotine dependence, cigarettes, uncomplicated: Secondary | ICD-10-CM | POA: Insufficient documentation

## 2016-02-25 DIAGNOSIS — Z7901 Long term (current) use of anticoagulants: Secondary | ICD-10-CM | POA: Diagnosis not present

## 2016-02-25 DIAGNOSIS — I809 Phlebitis and thrombophlebitis of unspecified site: Secondary | ICD-10-CM

## 2016-02-25 DIAGNOSIS — I10 Essential (primary) hypertension: Secondary | ICD-10-CM | POA: Insufficient documentation

## 2016-02-25 DIAGNOSIS — M79604 Pain in right leg: Secondary | ICD-10-CM | POA: Diagnosis present

## 2016-02-25 DIAGNOSIS — M7989 Other specified soft tissue disorders: Secondary | ICD-10-CM | POA: Diagnosis not present

## 2016-02-25 DIAGNOSIS — M79609 Pain in unspecified limb: Secondary | ICD-10-CM

## 2016-02-25 LAB — CBC WITH DIFFERENTIAL/PLATELET
Basophils Absolute: 0 10*3/uL (ref 0.0–0.1)
Basophils Relative: 0 %
Eosinophils Absolute: 0 10*3/uL (ref 0.0–0.7)
Eosinophils Relative: 1 %
HCT: 39.6 % (ref 39.0–52.0)
Hemoglobin: 12.8 g/dL — ABNORMAL LOW (ref 13.0–17.0)
Lymphocytes Relative: 33 %
Lymphs Abs: 1.6 10*3/uL (ref 0.7–4.0)
MCH: 22.8 pg — ABNORMAL LOW (ref 26.0–34.0)
MCHC: 32.3 g/dL (ref 30.0–36.0)
MCV: 70.6 fL — ABNORMAL LOW (ref 78.0–100.0)
Monocytes Absolute: 0.4 10*3/uL (ref 0.1–1.0)
Monocytes Relative: 9 %
Neutro Abs: 2.7 10*3/uL (ref 1.7–7.7)
Neutrophils Relative %: 57 %
Platelets: 193 10*3/uL (ref 150–400)
RBC: 5.61 MIL/uL (ref 4.22–5.81)
RDW: 16.1 % — ABNORMAL HIGH (ref 11.5–15.5)
WBC: 4.7 10*3/uL (ref 4.0–10.5)

## 2016-02-25 LAB — BASIC METABOLIC PANEL
Anion gap: 5 (ref 5–15)
BUN: 9 mg/dL (ref 6–20)
CO2: 28 mmol/L (ref 22–32)
Calcium: 9.1 mg/dL (ref 8.9–10.3)
Chloride: 106 mmol/L (ref 101–111)
Creatinine, Ser: 1.01 mg/dL (ref 0.61–1.24)
GFR calc Af Amer: >60 mL/min (ref >60)
GFR calc non Af Amer: >60 mL/min (ref >60)
Glucose, Bld: 91 mg/dL (ref 65–99)
Potassium: 3.7 mmol/L (ref 3.5–5.1)
Sodium: 139 mmol/L (ref 135–145)

## 2016-02-25 LAB — PROTIME-INR
INR: 1.21 (ref 0.00–1.49)
Prothrombin Time: 15.5 seconds — ABNORMAL HIGH (ref 11.6–15.2)

## 2016-02-25 MED ORDER — OXYCODONE-ACETAMINOPHEN 5-325 MG PO TABS: 1.0000 | ORAL_TABLET | ORAL | Status: DC | PRN

## 2016-02-25 MED ORDER — HYDROMORPHONE HCL 1 MG/ML IJ SOLN
2.0000 mg | Freq: Once | INTRAMUSCULAR | Status: AC
  Administered 2016-02-25: 2 mg via INTRAMUSCULAR
  Filled 2016-02-25: qty 2

## 2016-02-25 MED ORDER — ENOXAPARIN SODIUM 120 MG/0.8ML ~~LOC~~ SOLN
120.0000 mg | Freq: Once | SUBCUTANEOUS | Status: AC
  Administered 2016-02-25: 120 mg via SUBCUTANEOUS
  Filled 2016-02-25 (×2): qty 0.8

## 2016-02-25 MED ORDER — ENOXAPARIN SODIUM 120 MG/0.8ML ~~LOC~~ SOLN: 120.0000 mg | Freq: Two times a day (BID) | SUBCUTANEOUS | Status: DC

## 2016-02-25 NOTE — Progress Notes (Signed)
*  PRELIMINARY RESULTS* Vascular Ultrasound Right lower extremity venous duplex has been completed.  Preliminary findings: Appears to be DVT in the right posterior tibial veins, throughout the entire calf. Superficial thrombosis is also noted in varicosities of the GSV, mid right calf.   Called results to [place], [place], [place], RVT  02/25/2016, 2:20 PM

## 2016-02-25 NOTE — Discharge Instructions (Signed)
Make an appointment to follow-up with your primary care doctor. Call the Coumadin clinic with hematology and let them know your INR is still low. Use the Lovenox twice a day as directed until instructed that the Coumadin levels are adequate. Return for any chest pain or shortness of breath. Continue the Percocet for the foot pain.

## 2016-02-25 NOTE — ED Provider Notes (Signed)
CSN: [id number]     Arrival date & time 02/25/16  1143 History   First MD Initiated Contact with Patient 02/25/16 1213     Chief Complaint  Patient presents with  . Leg Pain     (Consider location/radiation/quality/duration/timing/severity/associated sxs/prior Treatment) Patient is a 65 y.o. male presenting with leg pain. The history is provided by the patient.  Leg Pain Associated symptoms: no fever   The patient presents with 2 complaints one is swelling to the right lower leg. As well as ball of the foot pain on the right foot. This is been ongoing for 3 days. Patient has a history of DVTs. At his Coumadin halted for a orthopedics shoulder procedure. Patient resumed Coumadin 5 days ago. Patient denies any chest pain or shortness of breath. Patient has known history of bilateral lower extremity varicose veins. Patient with complaint of swelling to the right leg pain on the bottom of the foot.             Past Medical History  Diagnosis Date  . Hypertension   . History of blood clots   . Diverticulitis   . Hernia   . Diverticulosis   . Shingles   . Gout   . Back pain   . Neuropathy, peripheral ([place]) 05/15/2012  . DVT (deep venous thrombosis) ([place])   . Anxiety and depression   . Nausea and vomiting    Past Surgical History  Procedure Laterality Date  . Back surgery    . Hernia repair    . Hiatal hernia repair    . Appendectomy    . Cholecystectomy  2008  . Sacral nerve stimulator placement  2011  . Left heart catheterization with coronary angiogram N/A 08/02/2014    Procedure: LEFT HEART CATHETERIZATION WITH CORONARY ANGIOGRAM;  Surgeon: Laverda Page, MD;  Location: [facility] CATH LAB;  Service: Cardiovascular;  Laterality: N/A;  . Multiple extractions with alveoloplasty N/A 10/19/2015    Procedure: Extraction of tooth #'s 2,12,13 with alveoloplasty;  Surgeon: Lenn Cal, DDS;  Location: [facility] [place] [facility];  Service: Oral Surgery;  Laterality: N/A;   Family History  Problem  Relation Age of Onset  . Prostate cancer Father   . Heart disease Father   . Diabetes Mother   . Heart disease Mother   . Colon cancer Maternal Uncle     dx in his 54's  . Pancreatic cancer Paternal Uncle   . Prostate cancer Paternal Uncle   . Multiple sclerosis Daughter   . Prostate cancer Paternal Uncle    Social History  Substance Use Topics  . Smoking status: Current Every Day Smoker -- 0.50 packs/day for 40 years    Types: Cigarettes  . Smokeless tobacco: Never Used     Comment: sometimes less.  . Alcohol Use: 0.0 oz/week    0 Standard drinks or equivalent per week     Comment: Occasional    Review of Systems  Constitutional: Negative for fever.  HENT: Negative for congestion.   Eyes: Negative for visual disturbance.  Respiratory: Negative for shortness of breath.   Cardiovascular: Positive for leg swelling. Negative for chest pain.  Gastrointestinal: Negative for abdominal pain.  Musculoskeletal: Positive for joint swelling.  Skin: Negative for rash.  Neurological: Negative for headaches.  Hematological: Bruises/bleeds easily.  Psychiatric/Behavioral: Negative for confusion.      Allergies  Aspirin; Celecoxib; Ibuprofen; Lyrica; and Vicodin  Home Medications   Prior to Admission medications   Medication Sig Start Date End Date Taking?  Authorizing Provider  allopurinol (ZYLOPRIM) 300 MG tablet Take 300 mg by mouth daily.   Yes Historical Provider, MD  cyclobenzaprine (FLEXERIL) 10 MG tablet Take 10 mg by mouth 3 (three) times daily. 02/15/16  Yes Historical Provider, MD  diazepam (VALIUM) 5 MG tablet Take 5 mg by mouth 3 (three) times daily.   Yes Historical Provider, MD  DULoxetine (CYMBALTA) 60 MG capsule Take 120 mg by mouth daily.    Yes Historical Provider, MD  metoprolol tartrate (LOPRESSOR) 25 MG tablet Take 25 mg by mouth 2 (two) times daily. 07/29/14  Yes Historical Provider, MD  NIFEDICAL XL 60 MG 24 hr tablet Take 120 mg by mouth daily.  02/21/14  Yes  Historical Provider, MD  ondansetron (ZOFRAN) 4 MG tablet Take 1 tablet (4 mg total) by mouth every 8 (eight) hours as needed for nausea or vomiting. 03/17/15  Yes [name], MD  oxycodone-acetaminophen (PERCOCET) 2.5-325 MG tablet Take 1 tablet by mouth 3 (three) times daily.   Yes Historical Provider, MD  PROAIR HFA 108 (90 BASE) MCG/ACT inhaler Inhale 1 puff into the lungs every 6 (six) hours as needed for wheezing.  03/23/14  Yes Historical Provider, MD  tiZANidine (ZANAFLEX) 4 MG tablet Take 4 mg by mouth every 6 (six) hours as needed for muscle spasms.  07/27/14  Yes Historical Provider, MD  valsartan-hydrochlorothiazide (DIOVAN-HCT) 320-25 MG per tablet Take 1 tablet by mouth daily.   Yes Historical Provider, MD  VOLTAREN 1 % GEL Apply 2 g topically daily as needed. For neck and shoulder pain 02/23/14  Yes Historical Provider, MD  warfarin (COUMADIN) 5 MG tablet take 2 tablets by mouth once daily Patient taking differently: Take 10 mg on Sat / Mon / Wed / Fri ; Take 12.5 mg on Sun / Tue / Tues / Thurs 02/19/16  Yes [name], MD   BP 157/99 mmHg  Pulse 64  Temp(Src) 97.8 F (36.6 C) (Oral)  Resp 14  SpO2 96% Physical Exam  Constitutional: He is oriented to person, place, and time. He appears well-developed and well-nourished. No distress.  HENT:  Head: Normocephalic and atraumatic.  Eyes: Conjunctivae and EOM are normal. Pupils are equal, round, and reactive to light.  Neck: Normal range of motion.  Cardiovascular: Normal rate, regular rhythm and normal heart sounds.   Pulmonary/Chest: Effort normal and breath sounds normal. No respiratory distress.  Abdominal: Soft. Bowel sounds are normal. There is no tenderness.  Musculoskeletal: Normal range of motion. He exhibits edema and tenderness.  Normal except for right leg. Obvious varicose veins superficially with some erythema and tenderness. Consistent with thrombophlebitis. Patient also with tenderness to palpation on the  lateral aspect of the ball of the right foot. No vascular compromise. No sensory deficit.  Neurological: He is alert and oriented to person, place, and time. No cranial nerve deficit. He exhibits normal muscle tone. Coordination normal.  Skin: Skin is warm. There is erythema.    ED Course  Procedures (including critical care time) Labs Review Labs Reviewed  CBC WITH DIFFERENTIAL/PLATELET - Abnormal; Notable for the following:    Hemoglobin 12.8 (*)    MCV 70.6 (*)    MCH 22.8 (*)    RDW 16.1 (*)    All other components within normal limits  PROTIME-INR - Abnormal; Notable for the following:    Prothrombin Time 15.5 (*)    All other components within normal limits  BASIC METABOLIC PANEL   Results for orders placed or performed  during the hospital encounter of 02/25/16  CBC with Differential/Platelet  Result Value Ref Range   WBC 4.7 4.0 - 10.5 K/uL   RBC 5.61 4.22 - 5.81 MIL/uL   Hemoglobin 12.8 (L) 13.0 - 17.0 g/dL   HCT 39.6 39.0 - 52.0 %   MCV 70.6 (L) 78.0 - 100.0 fL   MCH 22.8 (L) 26.0 - 34.0 pg   MCHC 32.3 30.0 - 36.0 g/dL   RDW 16.1 (H) 11.5 - 15.5 %   Platelets 193 150 - 400 K/uL   Neutrophils Relative % 57 %   Neutro Abs 2.7 1.7 - 7.7 K/uL   Lymphocytes Relative 33 %   Lymphs Abs 1.6 0.7 - 4.0 K/uL   Monocytes Relative 9 %   Monocytes Absolute 0.4 0.1 - 1.0 K/uL   Eosinophils Relative 1 %   Eosinophils Absolute 0.0 0.0 - 0.7 K/uL   Basophils Relative 0 %   Basophils Absolute 0.0 0.0 - 0.1 K/uL  Basic metabolic panel  Result Value Ref Range   Sodium 139 135 - 145 mmol/L   Potassium 3.7 3.5 - 5.1 mmol/L   Chloride 106 101 - 111 mmol/L   CO2 28 22 - 32 mmol/L   Glucose, Bld 91 65 - 99 mg/dL   BUN 9 6 - 20 mg/dL   Creatinine, Ser 1.01 0.61 - 1.24 mg/dL   Calcium 9.1 8.9 - 10.3 mg/dL   GFR calc non Af Amer >60 >60 mL/min   GFR calc Af Amer >60 >60 mL/min   Anion gap 5 5 - 15  Protime-INR  Result Value Ref Range   Prothrombin Time 15.5 (H) 11.6 - 15.2  seconds   INR 1.21 0.00 - 1.49     Imaging Review Dg Foot Complete Right  02/25/2016  CLINICAL DATA:  Patient with severe right foot pain along the lateral side. No known injury. Initial encounter. EXAM: RIGHT FOOT COMPLETE - 3+ VIEW COMPARISON:  None. FINDINGS: Hallux valgus deformity. First MTP joint degenerative changes. Bipartite medial sesamoid bone. Midfoot degenerative changes. Plantar calcaneal spurring. Vascular calcifications. No acute osseous abnormality or evidence for acute fracture. IMPRESSION: No acute osseous abnormality. Electronically Signed   By: Lovey Newcomer M.D.   On: 02/25/2016 13:40   I have personally reviewed and evaluated these images and lab results as part of my medical decision-making.   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 Patient recently had his Coumadin stopped for a shoulder procedure periods been back on Coumadin for the past 5 days. He is followed by the hematology Coumadin clinic. Patient here with complaint of right leg swelling and tenderness in the right calf with the nodules that are really consistent with more of a superficial varicose vein thrombophlebitis. However Doppler study show evidence of a posterior tibial DVT. Patient's INR is still subtherapeutic. Will start him on Lovenox. He's been on this in the past and knows how to give it. Have him call the clinic tomorrow for follow-up and also notify his primary care doctor. Patient has no chest pain or shortness of breath. Patient is already on Percocet for pain control. He has had to take for the leg pain. One of his main complaints was pain to his right foot ball of the foot does lots of arthritic changes in that foot which may explain that pain. Would not be related to the DVT pain.  [name], MD 02/25/16 [phone]

## 2016-02-25 NOTE — ED Notes (Addendum)
Gave pt drink per Melissa-RN.

## 2016-02-25 NOTE — ED Notes (Signed)
Pt ambulates independently and with steady gait at time of discharge. Discharge instructions and follow up information reviewed with patient. No other questions or concerns voiced at this time.  

## 2016-02-25 NOTE — ED Notes (Addendum)
Patient here with righ lower leg pain and swelling with nodules to same x 3 days. Has been of coumadin for several days for a shoulder procedure and started back on coumadin Friday the knots on right leg are superficial and easily palpated and easily visualized.

## 2016-02-27 ENCOUNTER — Ambulatory Visit (INDEPENDENT_AMBULATORY_CARE_PROVIDER_SITE_OTHER): Payer: Medicare Other | Admitting: Pharmacist

## 2016-02-27 DIAGNOSIS — Z7901 Long term (current) use of anticoagulants: Secondary | ICD-10-CM | POA: Diagnosis not present

## 2016-02-27 DIAGNOSIS — I82401 Acute embolism and thrombosis of unspecified deep veins of right lower extremity: Secondary | ICD-10-CM

## 2016-02-27 LAB — POCT INR: INR: 2.1

## 2016-02-27 NOTE — Patient Instructions (Signed)
Patient instructed to take medications as defined in the Anti-coagulation Track section of this encounter.  Patient instructed to TAKE today's dose.  Patient will increase dose as shown above. Patient will continue Lovenox 120mg  subcutaneously administered at level of waistband, 2 inches from your belly button, rotating sites (left then right, etc.) until all syringes provided in your prescription by the emergency room provider have been used.  Patient verbalized understanding of these instructions.

## 2016-02-27 NOTE — Progress Notes (Signed)
Anti-Coagulation Progress Note  [name] [name] is a 65 y.o. male who is currently on an anti-coagulation regimen.    RECENT RESULTS: Recent results are below, the most recent result is correlated with a dose of 80 mg. per week with concomitant Lovenox 120mg  SQ q12h since Sunday February 25, 2016 as prescribed by ED physician for right lower extremity DVT: Lab Results  Component Value Date   INR 2.10 02/27/2016   INR 1.21 02/25/2016   INR 1.13 02/22/2016   PROTIME 21.6* 05/19/2015    ANTI-COAG DOSE: Anticoagulation Dose Instructions as of 02/27/2016      Sun Mon Tue Wed Thu Fri Sat   New Dose 12.5 mg 10 mg 12.5 mg 12.5 mg 12.5 mg 10 mg 12.5 mg    Description        Patient will increase dose as shown above. Patient will continue Lovenox 120mg  subcutaneously administered at level of waistband, 2 inches from your belly button, rotating sites (left then right, etc.) until all syringes provided in your prescription by the emergency room provider have been used.        ANTICOAG SUMMARY: Anticoagulation Episode Summary    Current INR goal 2.0-3.0   Next INR check 03/04/2016   INR from last check 2.10 (02/27/2016)   Weekly max dose    Target end date    INR check location    Preferred lab    Send INR reminders to [place]      Comments       Anticoagulation Care Providers    Provider Role Specialty Phone number   [name], MD Responsible Oncology [phone]      ANTICOAG TODAY: Anticoagulation Summary as of 02/27/2016    INR goal 2.0-3.0   Selected INR 2.10 (02/27/2016)   Next INR check 03/04/2016   Target end date     Anticoagulation Episode Summary    INR check location    Preferred lab    Send INR reminders to [place] Providers    Provider Role Specialty Phone number   [name], MD Responsible Oncology [phone]      PATIENT INSTRUCTIONS: Patient Instructions  Patient instructed to  take medications as defined in the Anti-coagulation Track section of this encounter.  Patient instructed to TAKE today's dose.  Patient will increase dose as shown above. Patient will continue Lovenox 120mg  subcutaneously administered at level of waistband, 2 inches from your belly button, rotating sites (left then right, etc.) until all syringes provided in your prescription by the emergency room provider have been used.  Patient verbalized understanding of these instructions.       FOLLOW-UP Return in 6 days (on 03/04/2016) for Follow up INR at 1115h.  Jorene Guest, III Pharm.D., CACP

## 2016-02-27 NOTE — Progress Notes (Signed)
Reviewed Thanks DrG 

## 2016-03-04 ENCOUNTER — Ambulatory Visit: Payer: Self-pay

## 2016-03-05 ENCOUNTER — Ambulatory Visit (INDEPENDENT_AMBULATORY_CARE_PROVIDER_SITE_OTHER): Payer: Medicare Other | Admitting: Pharmacist

## 2016-03-05 DIAGNOSIS — I82402 Acute embolism and thrombosis of unspecified deep veins of left lower extremity: Secondary | ICD-10-CM | POA: Diagnosis not present

## 2016-03-05 DIAGNOSIS — Z7901 Long term (current) use of anticoagulants: Secondary | ICD-10-CM | POA: Diagnosis not present

## 2016-03-05 LAB — POCT INR: INR: 3

## 2016-03-05 NOTE — Patient Instructions (Signed)
Patient instructed to take medications as defined in the Anti-coagulation Track section of this encounter.  Patient instructed to take today's dose.  Patient verbalized understanding of these instructions.    

## 2016-03-05 NOTE — Progress Notes (Signed)
Reviewed Thanks DrG   Agree w letting INR run upper normal in view of recent DVT

## 2016-03-05 NOTE — Progress Notes (Signed)
Anti-Coagulation Progress Note  [name] [name] is a 65 y.o. male who is currently on an anti-coagulation regimen.    RECENT RESULTS: Recent results are below, the most recent result is correlated with a dose of 82.5 mg. per week: Lab Results  Component Value Date   INR 3.0 03/05/2016   INR 2.10 02/27/2016   INR 1.21 02/25/2016   PROTIME 21.6* 05/19/2015    ANTI-COAG DOSE: Anticoagulation Dose Instructions as of 03/05/2016      Sun Mon Tue Wed Thu Fri Sat   New Dose 12.5 mg 10 mg 12.5 mg 10 mg 12.5 mg 10 mg 10 mg       ANTICOAG SUMMARY: Anticoagulation Episode Summary    Current INR goal 2.0-3.0   Next INR check 03/11/2016   INR from last check 3.0 (03/05/2016)   Weekly max dose    Target end date    INR check location    Preferred lab    Send INR reminders to [place]      Comments       Anticoagulation Care Providers    Provider Role Specialty Phone number   [name], MD Responsible Oncology [phone]      ANTICOAG TODAY: Anticoagulation Summary as of 03/05/2016    INR goal 2.0-3.0   Selected INR 3.0 (03/05/2016)   Next INR check 03/11/2016   Target end date     Anticoagulation Episode Summary    INR check location    Preferred lab    Send INR reminders to [place] Providers    Provider Role Specialty Phone number   [name], MD Responsible Oncology [phone]      PATIENT INSTRUCTIONS: Patient Instructions  Patient instructed to take medications as defined in the Anti-coagulation Track section of this encounter.  Patient instructed to take today's dose.  Patient verbalized understanding of these instructions.       FOLLOW-UP Return in about 6 days (around 03/11/2016) for Follow up INR at 1015h.  Jorene Guest, III Pharm.D., CACP

## 2016-03-11 ENCOUNTER — Ambulatory Visit (INDEPENDENT_AMBULATORY_CARE_PROVIDER_SITE_OTHER): Payer: Medicare Other | Admitting: Pharmacist

## 2016-03-11 DIAGNOSIS — Z7901 Long term (current) use of anticoagulants: Secondary | ICD-10-CM

## 2016-03-11 DIAGNOSIS — Z86718 Personal history of other venous thrombosis and embolism: Secondary | ICD-10-CM | POA: Diagnosis not present

## 2016-03-11 LAB — POCT INR: INR: 3

## 2016-03-11 NOTE — Progress Notes (Signed)
Anti-Coagulation Progress Note  AUN VERRICO is a 65 y.o. male who is currently on an anti-coagulation regimen.    RECENT RESULTS: Recent results are below, the most recent result is correlated with a dose of 77.5 mg. per week; reduced at this visit to 75mg /wk: Lab Results  Component Value Date   INR 3.00 03/11/2016   INR 3.0 03/05/2016   INR 2.10 02/27/2016   PROTIME 21.6* 05/19/2015    ANTI-COAG DOSE: Anticoagulation Dose Instructions as of 03/11/2016      Sun Mon Tue Wed Thu Fri Sat   New Dose 10 mg 10 mg 12.5 mg 10 mg 12.5 mg 10 mg 10 mg       ANTICOAG SUMMARY: Anticoagulation Episode Summary    Current INR goal 2.0-3.0   Next INR check 03/25/2016   INR from last check 3.00 (03/11/2016)   Weekly max dose    Target end date    INR check location    Preferred lab    Send INR reminders to [place]      Comments       Anticoagulation Care Providers    Provider Role Specialty Phone number   [name], MD Responsible Oncology [phone]      ANTICOAG TODAY: Anticoagulation Summary as of 03/11/2016    INR goal 2.0-3.0   Selected INR 3.00 (03/11/2016)   Next INR check 03/25/2016   Target end date     Anticoagulation Episode Summary    INR check location    Preferred lab    Send INR reminders to [place] Providers    Provider Role Specialty Phone number   [name], MD Responsible Oncology [phone]      PATIENT INSTRUCTIONS: Patient Instructions  Patient instructed to take medications as defined in the Anti-coagulation Track section of this encounter.  Patient instructed to take today's dose.  Patient verbalized understanding of these instructions.       FOLLOW-UP Return in 2 weeks (on 03/25/2016) for Follow up INR at 1030h.  Jorene Guest, III Pharm.D., CACP

## 2016-03-11 NOTE — Patient Instructions (Signed)
Patient instructed to take medications as defined in the Anti-coagulation Track section of this encounter.  Patient instructed to take today's dose.  Patient verbalized understanding of these instructions.    

## 2016-03-13 NOTE — Progress Notes (Signed)
INTERNAL MEDICINE TEACHING ATTENDING ADDENDUM - Matheau Orona M.D  Duration- indefinite, Indication- PE, DVT, INR- therapeutic. Agree with pharmacy recommendations as outlined in their note.      

## 2016-04-02 ENCOUNTER — Other Ambulatory Visit: Payer: Self-pay | Admitting: Oncology

## 2016-04-02 ENCOUNTER — Ambulatory Visit (INDEPENDENT_AMBULATORY_CARE_PROVIDER_SITE_OTHER): Payer: Medicare Other | Admitting: Oncology

## 2016-04-02 ENCOUNTER — Telehealth: Payer: Self-pay | Admitting: *Deleted

## 2016-04-02 ENCOUNTER — Encounter: Payer: Self-pay | Admitting: Oncology

## 2016-04-02 VITALS — BP 168/113 | HR 78 | Temp 98.1°F | Ht 76.0 in | Wt 258.9 lb

## 2016-04-02 DIAGNOSIS — I82409 Acute embolism and thrombosis of unspecified deep veins of unspecified lower extremity: Secondary | ICD-10-CM

## 2016-04-02 DIAGNOSIS — M10061 Idiopathic gout, right knee: Secondary | ICD-10-CM

## 2016-04-02 DIAGNOSIS — M109 Gout, unspecified: Secondary | ICD-10-CM | POA: Insufficient documentation

## 2016-04-02 LAB — CBC WITH DIFFERENTIAL/PLATELET
Basophils Absolute: 0 10*3/uL (ref 0.0–0.1)
Basophils Relative: 0 %
Eosinophils Absolute: 0.1 10*3/uL (ref 0.0–0.7)
Eosinophils Relative: 1 %
HCT: 40.5 % (ref 39.0–52.0)
Hemoglobin: 13.2 g/dL (ref 13.0–17.0)
Lymphocytes Relative: 41 %
Lymphs Abs: 1.9 10*3/uL (ref 0.7–4.0)
MCH: 22.9 pg — ABNORMAL LOW (ref 26.0–34.0)
MCHC: 32.6 g/dL (ref 30.0–36.0)
MCV: 70.3 fL — ABNORMAL LOW (ref 78.0–100.0)
Monocytes Absolute: 0.5 10*3/uL (ref 0.1–1.0)
Monocytes Relative: 11 %
Neutro Abs: 2.1 10*3/uL (ref 1.7–7.7)
Neutrophils Relative %: 47 %
Platelets: 244 10*3/uL (ref 150–400)
RBC: 5.76 MIL/uL (ref 4.22–5.81)
RDW: 16.4 % — ABNORMAL HIGH (ref 11.5–15.5)
WBC: 4.5 10*3/uL (ref 4.0–10.5)

## 2016-04-02 LAB — D-DIMER, QUANTITATIVE: D-Dimer, Quant: <0.27 ug/mL-FEU (ref 0.00–0.50)

## 2016-04-02 LAB — PROTIME-INR
INR: 2.55 — ABNORMAL HIGH (ref 0.00–1.49)
Prothrombin Time: 27.1 seconds — ABNORMAL HIGH (ref 11.6–15.2)

## 2016-04-02 MED ORDER — ACETAMINOPHEN-CODEINE #3 300-30 MG PO TABS: 1.0000 | ORAL_TABLET | Freq: Four times a day (QID) | ORAL | 0 refills | Status: DC | PRN

## 2016-04-02 MED ORDER — COLCHICINE 0.6 MG PO TABS: 0.6000 mg | ORAL_TABLET | Freq: Two times a day (BID) | ORAL | 2 refills | Status: DC

## 2016-04-02 NOTE — Patient Instructions (Addendum)
Start colchicine twice daily for gout attack Tylenol with codeine 1 every 6 hours as needed for pain  Stay on current dose of coumadin Return visit in 2 months Lab 1 week before visit

## 2016-04-02 NOTE — Addendum Note (Signed)
Addended by: Truddie Crumble on: 04/02/2016 02:54 PM   Modules accepted: Orders

## 2016-04-02 NOTE — Telephone Encounter (Signed)
Pt calls and states the leg that he just had problems w/ started swelling last week- middle of week, has progressively gotten worse, now it is swollen from the knee to the groin area, hot to touch and painful Spoke w/ dr Beryle Beams, add to his schedule this am- placed in 0945 slot and informed pt to have someone drive him to office, he is agreeable
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
## 2016-04-03 LAB — URIC ACID: Uric Acid: 8 mg/dL (ref 3.7–8.6)

## 2016-04-04 ENCOUNTER — Other Ambulatory Visit: Payer: Self-pay | Admitting: Oncology

## 2016-05-07 ENCOUNTER — Ambulatory Visit (INDEPENDENT_AMBULATORY_CARE_PROVIDER_SITE_OTHER): Payer: Medicare Other | Admitting: Pharmacist

## 2016-05-07 DIAGNOSIS — Z7901 Long term (current) use of anticoagulants: Secondary | ICD-10-CM

## 2016-05-07 LAB — POCT INR: INR: 4.2

## 2016-05-07 NOTE — Patient Instructions (Signed)
Patient instructed to take medications as defined in the Anti-coagulation Track section of this encounter.  Patient instructed to OMIT today's dose (Tuesday 29-AUG-17). Re-start warfarin tomorrow Wednesday 20-AUG-17 and take as shown in your instruction sheet provided from www.doseresponse.comPatient verbalized understanding of these instructions.

## 2016-05-07 NOTE — Progress Notes (Signed)
Reviewed Thanks DrG 

## 2016-05-07 NOTE — Progress Notes (Signed)
Anti-Coagulation Progress Note  [name] [name] is a 65 y.o. male who is currently on an anti-coagulation regimen.    RECENT RESULTS: Recent results are below, the most recent result is correlated with a dose of 75 mg. per week: Lab Results  Component Value Date   INR 4.2 05/07/2016   INR 2.55 (H) 04/02/2016   INR 3.00 03/11/2016   PROTIME 21.6 (H) 05/19/2015    ANTI-COAG DOSE: Anticoagulation Dose Instructions as of 05/07/2016      Dorene Grebe Tue Wed Thu Fri Sat   New Dose 10 mg 10 mg 10 mg 7.5 mg 10 mg 10 mg 10 mg    Description   Will OMIT today's dose (Tuesday 29-AUG-17). Re-start warfarin on Wednesday 30-AUG-17 and take as instructed from www.doseresponse.com dosing instructions provided.       ANTICOAG SUMMARY: Anticoagulation Episode Summary    Current INR goal:   2.0-3.0  TTR:   54.4 % (1.6 y)  Next INR check:   05/14/2016  INR from last check:   4.2! (05/07/2016)  Weekly max dose:     Target end date:     INR check location:     Preferred lab:     Send INR reminders to:   [facility]     Comments:         Anticoagulation Care Providers    Provider Role Specialty Phone number   [name], MD Responsible Oncology [phone]      ANTICOAG TODAY: Anticoagulation Summary  As of 05/07/2016   INR goal:   2.0-3.0  TTR:     Today's INR:   4.2!  Next INR check:   05/14/2016  Target end date:         Anticoagulation Episode Summary    INR check location:      Preferred lab:      Send INR reminders to:   [facility]   Comments:       Anticoagulation Care Providers    Provider Role Specialty Phone number   [name], MD Responsible Oncology [phone]      PATIENT INSTRUCTIONS: There are no Patient Instructions on file for this visit.   FOLLOW-UP Return in 7 days (on 05/14/2016) for Follow up INR at [place], III Pharm.D., CACP

## 2016-05-09 ENCOUNTER — Telehealth: Payer: Self-pay | Admitting: *Deleted

## 2016-05-09 NOTE — Telephone Encounter (Signed)
Called pt back - pt will be here tomorrow at 0930 AM for lab.

## 2016-05-09 NOTE — Telephone Encounter (Signed)
noted 

## 2016-05-09 NOTE — Telephone Encounter (Signed)
Talked to Dr Beryle Beams - need pt to come in and have PT/INR check.Marland Kitchen

## 2016-05-09 NOTE — Telephone Encounter (Signed)
Return pt's call - states he was in [place] visiting his daughter; his right leg starting hurting so he went to the ED and an U/S was done. They did an U/S from thigh to ankle, was told he has a blood clot. Was given something for pain and a compression stocking. And was told to f/u with his doctor when he gets home.

## 2016-05-10 ENCOUNTER — Encounter: Payer: Self-pay | Admitting: Oncology

## 2016-05-10 ENCOUNTER — Telehealth: Payer: Self-pay | Admitting: *Deleted

## 2016-05-10 ENCOUNTER — Other Ambulatory Visit: Payer: Self-pay | Admitting: Oncology

## 2016-05-10 ENCOUNTER — Other Ambulatory Visit (INDEPENDENT_AMBULATORY_CARE_PROVIDER_SITE_OTHER): Payer: Medicare Other

## 2016-05-10 ENCOUNTER — Ambulatory Visit: Payer: Self-pay | Admitting: Pharmacist

## 2016-05-10 DIAGNOSIS — I82401 Acute embolism and thrombosis of unspecified deep veins of right lower extremity: Secondary | ICD-10-CM

## 2016-05-10 DIAGNOSIS — M10061 Idiopathic gout, right knee: Secondary | ICD-10-CM

## 2016-05-10 DIAGNOSIS — I82409 Acute embolism and thrombosis of unspecified deep veins of unspecified lower extremity: Secondary | ICD-10-CM

## 2016-05-10 DIAGNOSIS — Z7901 Long term (current) use of anticoagulants: Secondary | ICD-10-CM

## 2016-05-10 LAB — POCT INR: INR: 1.9

## 2016-05-10 MED ORDER — RIVAROXABAN 20 MG PO TABS: 20.0000 mg | ORAL_TABLET | Freq: Every day | ORAL | 3 refills | Status: DC

## 2016-05-10 NOTE — Telephone Encounter (Signed)
-----   Message from [name], MD sent at 05/10/2016 11:51 AM EDT ----- As I suspected - he is subtherapeutic again!  He needs to go back on lovenox today. We need to have pharmacy work on getting him Xarelto or Eliquis since his INR are all over the map & he keeps getting recurrent clots when he gets subtherapeutic.

## 2016-05-10 NOTE — Telephone Encounter (Signed)
Pt was here at the center; was seen by Mannie Stabile. Dr Beryle Beams aware.

## 2016-05-10 NOTE — Addendum Note (Signed)
Addended by: Truddie Crumble on: 05/10/2016 11:35 AM   Modules accepted: Orders

## 2016-05-10 NOTE — Progress Notes (Signed)
Medication Samples have been provided to the patient.  Drug name: ALBUTEROL       Strength: 108 MCG/ACT        Qty: 1  LOT: [id number]  Exp.Date: 01-2018  Dosing instructions: INHALE 2 PUFFS EVERY 6 H PRN FOR WHEEZING  Drug name: XARELTO       Strength: 20 MG        Qty: 14 TAB  LOT: [id number]  Exp.Date: 05-2018  Dosing instructions: TAKE 1 TAB EVERY DAY WITH SUPPER  The patient has been instructed regarding the correct time, dose, and frequency of taking this medication, including desired effects and most common side effects.   Terald Sleeper 12:47 PM 05/10/2016

## 2016-05-10 NOTE — Progress Notes (Signed)
Patient called on 8/31 to say he has a recurrent DVT in his right leg extending to his thigh. He was evaluated at [facility] ED. He was told to call me. He was not able to come in yesterday. He is having pain along the femoral canal on the right. He denies any chest pain, shortness of breath, or palpitations. Lungs are clear. Regular cardiac rhythm. Tender on palpation of R calf & thigh. INR 1.9 Impression: Multiply recurrent DVTs/ previous PE S/P caval filter placement on chronic anticoagulatiopn.  Erratic coumadin levels with DVTs occurring every time he becomes subtherapeutic. Plan: Therapeutic lovenox given in ofice today. Stop coumadin Begin Xarelto. I may need to scan his abdomen pelvis to exclude other pathology.

## 2016-05-11 LAB — COMPREHENSIVE METABOLIC PANEL
ALT: 15 IU/L (ref 0–44)
AST: 16 IU/L (ref 0–40)
Albumin/Globulin Ratio: 1.9 (ref 1.2–2.2)
Albumin: 4.5 g/dL (ref 3.6–4.8)
Alkaline Phosphatase: 61 IU/L (ref 39–117)
BUN/Creatinine Ratio: 14 (ref 10–24)
BUN: 13 mg/dL (ref 8–27)
Bilirubin Total: 0.3 mg/dL (ref 0.0–1.2)
CO2: 24 mmol/L (ref 18–29)
Calcium: 10 mg/dL (ref 8.6–10.2)
Chloride: 98 mmol/L (ref 96–106)
Creatinine, Ser: 0.91 mg/dL (ref 0.76–1.27)
GFR calc Af Amer: 101 mL/min/{1.73_m2} (ref >59)
GFR calc non Af Amer: 88 mL/min/{1.73_m2} (ref >59)
Globulin, Total: 2.4 g/dL (ref 1.5–4.5)
Glucose: 94 mg/dL (ref 65–99)
Potassium: 3.9 mmol/L (ref 3.5–5.2)
Sodium: 142 mmol/L (ref 134–144)
Total Protein: 6.9 g/dL (ref 6.0–8.5)

## 2016-05-11 LAB — URIC ACID: Uric Acid: 7.2 mg/dL (ref 3.7–8.6)

## 2016-05-14 ENCOUNTER — Ambulatory Visit: Payer: Self-pay

## 2016-05-15 NOTE — Progress Notes (Signed)
CLINICAL PHARMACY ANTICOAGULATION TRANSITION NOTE [name] [name] is a 65 y.o. male who is currently on an anti-coagulation regimen.  RECENT RESULTS: Lab Results  Component Value Date   INR 1.9 05/10/2016   INR 4.2 05/07/2016   INR 2.55 (H) 04/02/2016   PROTIME 21.6 (H) 05/19/2015   ANTICOAG SUMMARY: Anticoagulation Episode Summary    Current INR goal:   2.0-3.0  TTR:   54.4 % (1.6 y)  Next INR check:   05/14/2016  INR from last check:   4.2! (05/07/2016)  Most recent INR:    1.9! (05/10/2016)  Weekly max dose:     Target end date:     INR check location:     Preferred lab:     Send INR reminders to:   [facility]     Comments:         Anticoagulation Care Providers    Provider Role Specialty Phone number   [name], MD Responsible Oncology [phone]     ASSESSMENT Indication(s): DVT, PE and history Duration: indefinite  Labs: Component Value Date/Time   AST 16 05/10/2016 0951   AST 14 04/04/2014 1116   ALT 15 05/10/2016 0951   ALT 10 04/04/2014 1116   NA 142 05/10/2016 0951   NA 143 04/04/2014 1116   K 3.9 05/10/2016 0951   K 3.7 04/04/2014 1116   CL 98 05/10/2016 0951   CO2 24 05/10/2016 0951   CO2 30 (H) 04/04/2014 1116   GLUCOSE 94 05/10/2016 0951   GLUCOSE 91 02/25/2016 1250   GLUCOSE 89 04/04/2014 1116   BUN 13 05/10/2016 0951   BUN 10.1 04/04/2014 1116   CREATININE 0.91 05/10/2016 0951   CREATININE 1.0 04/04/2014 1116   CALCIUM 10.0 05/10/2016 0951   CALCIUM 9.3 04/04/2014 1116   GFRAA 101 05/10/2016 0951   WBC 4.5 04/02/2016 1004   HGB 13.2 04/02/2016 1004   HGB 13.1 04/04/2014 1110   HCT 40.5 04/02/2016 1004   HCT 41.7 04/04/2014 1110   PLT 244 04/02/2016 1004   PLT 210 04/04/2014 1110   Recommendations:  D/c warfarin Enoxaparin 120 mg x 1 dose Medication Samples have been provided to the patient.  LOT: [id number] Exp.Date: 03/2018  Transition to new oral anticoagulant rivaroxaban (Xarelto) 20 mg daily.  PLAN Rivaroxaban  (Xarelto) samples given to patient as documented by pharmacy student, [name] [name].  [facility] Pharmacist 05/15/2016, 2:15 PM

## 2016-05-20 ENCOUNTER — Telehealth: Payer: Self-pay | Admitting: *Deleted

## 2016-05-20 NOTE — Telephone Encounter (Signed)
"  I have pain in my right leg.  Have a history of blood clots and need to talk with Dr, Beryle Beams.  I'm currently in [place] with my daughter."  Called patient to provide [facility] phone number but says he has talked with Dr Elie Confer.  Would not accept [facility] phone number.  call ended.

## 2016-05-27 NOTE — Progress Notes (Signed)
Will discuss with Dr. Kim--his candidacy and access to DOACs. Thanks

## 2016-05-30 ENCOUNTER — Ambulatory Visit (HOSPITAL_COMMUNITY)
Admission: EM | Admit: 2016-05-30 | Discharge: 2016-05-30 | Disposition: A | Discharge status: Home / Self Care | Payer: Medicare Other

## 2016-05-30 ENCOUNTER — Ambulatory Visit (INDEPENDENT_AMBULATORY_CARE_PROVIDER_SITE_OTHER): Payer: Medicare Other | Admitting: Pharmacist

## 2016-05-30 ENCOUNTER — Encounter (HOSPITAL_COMMUNITY): Payer: Self-pay | Admitting: *Deleted

## 2016-05-30 ENCOUNTER — Emergency Department (HOSPITAL_COMMUNITY)
Admission: EM | Admit: 2016-05-30 | Discharge: 2016-05-30 | Disposition: A | Discharge status: Home / Self Care | Payer: Medicare Other | Attending: Emergency Medicine | Admitting: Emergency Medicine

## 2016-05-30 DIAGNOSIS — Z79899 Other long term (current) drug therapy: Secondary | ICD-10-CM | POA: Insufficient documentation

## 2016-05-30 DIAGNOSIS — G8929 Other chronic pain: Secondary | ICD-10-CM | POA: Insufficient documentation

## 2016-05-30 DIAGNOSIS — M79604 Pain in right leg: Secondary | ICD-10-CM | POA: Insufficient documentation

## 2016-05-30 DIAGNOSIS — Z955 Presence of coronary angioplasty implant and graft: Secondary | ICD-10-CM | POA: Diagnosis not present

## 2016-05-30 DIAGNOSIS — I824Y1 Acute embolism and thrombosis of unspecified deep veins of right proximal lower extremity: Secondary | ICD-10-CM | POA: Diagnosis not present

## 2016-05-30 DIAGNOSIS — F17210 Nicotine dependence, cigarettes, uncomplicated: Secondary | ICD-10-CM | POA: Insufficient documentation

## 2016-05-30 DIAGNOSIS — I10 Essential (primary) hypertension: Secondary | ICD-10-CM | POA: Diagnosis not present

## 2016-05-30 DIAGNOSIS — Z7901 Long term (current) use of anticoagulants: Secondary | ICD-10-CM

## 2016-05-30 DIAGNOSIS — M79601 Pain in right arm: Secondary | ICD-10-CM

## 2016-05-30 MED ORDER — OXYCODONE-ACETAMINOPHEN 5-325 MG PO TABS: 2.0000 | ORAL_TABLET | ORAL | 0 refills | Status: DC | PRN

## 2016-05-30 MED ORDER — OXYCODONE-ACETAMINOPHEN 5-325 MG PO TABS
ORAL_TABLET | ORAL | Status: AC
  Administered 2016-05-30: 1
  Filled 2016-05-30: qty 1

## 2016-05-30 MED ORDER — OXYCODONE-ACETAMINOPHEN 5-325 MG PO TABS
1.0000 | ORAL_TABLET | ORAL | Status: DC | PRN
  Administered 2016-05-30: 1 via ORAL
  Filled 2016-05-30: qty 1

## 2016-05-30 NOTE — ED Notes (Signed)
Pt departed in NAD, refused use of wheelchair.  

## 2016-05-30 NOTE — Progress Notes (Signed)
Noted I am happy to see he is on Xarelto & off coumadin

## 2016-05-30 NOTE — ED Notes (Signed)
Attempted to provide pt with d/c instructions at this time. Pt states, "I need a shot in my hindparts for pain before I am discharged."  Will notify provider of pt's request.

## 2016-05-30 NOTE — ED Provider Notes (Signed)
[facility] DEPT Provider Note   CSN: [id number] Arrival date & time: 05/30/16  1504   History   Chief Complaint Chief Complaint  Patient presents with  . Leg Pain    HPI [name] is a 65 y.o. male who presents with right leg pain. 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 He states that he has had constant pain in the right calf and thigh which radiates to the groin since June. He states the pain is constant and worse when he lies down. He was on Warfarin however was having difficulty with controlling the levels therefore was switched to Xarelto in early Sept. He has been going back an forth to [place] since his step daughter lives there. Had a D-dimer on 7/25 which was negative. 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 A vascular surgery consult was placed who recommended compression stockings which he has been compliant with. See [name]. Denies chest pain, SOB. He is out of his pain medicine.  HPI  Past Medical History:  Diagnosis Date  . Anxiety and depression   . Back pain   . Diverticulitis   . Diverticulosis   . DVT (deep venous thrombosis) ([place])   . Gout   . Hernia   . History of blood clots   . Hypertension   . Nausea and vomiting   . Neuropathy, peripheral ([place]) 05/15/2012  . Shingles     Patient Active Problem List   Diagnosis Date Noted  . Gout attack 04/02/2016  . Angina pectoris ([place]) 08/01/2014  . Hemorrhage of rectum and anus 03/28/2014  . Neuropathy, peripheral ([place]) 05/15/2012  . Pulmonary embolism ([place]) 07/11/2011  . DVT (deep venous thrombosis) ([place]) 07/11/2011  . Abdominal pain, left lower quadrant 01/29/2011  . Long term (current) use of anticoagulants 01/29/2011  . Diarrhea 01/29/2011    Past Surgical History:  Procedure Laterality Date  . APPENDECTOMY    . BACK SURGERY    . CHOLECYSTECTOMY  2008  . HERNIA REPAIR    . HIATAL  HERNIA REPAIR    . LEFT HEART CATHETERIZATION WITH CORONARY ANGIOGRAM N/A 08/02/2014   Procedure: LEFT HEART CATHETERIZATION WITH CORONARY ANGIOGRAM;  Surgeon: Laverda Page, MD;  Location: [facility] CATH LAB;  Service: Cardiovascular;  Laterality: N/A;  . MULTIPLE EXTRACTIONS WITH ALVEOLOPLASTY N/A 10/19/2015   Procedure: Extraction of tooth #'s 2,12,13 with alveoloplasty;  Surgeon: Lenn Cal, DDS;  Location: [facility];  Service: Oral Surgery;  Laterality: N/A;  . SACRAL NERVE STIMULATOR PLACEMENT  2011       Home Medications    Prior to Admission medications   Medication Sig Start Date End Date Taking? Authorizing Provider  acetaminophen-codeine (TYLENOL #3) 300-30 MG tablet Take 1 tablet by mouth every 6 (six) hours as needed for moderate pain. 04/02/16 04/02/17  [name], MD  albuterol (PROVENTIL HFA;VENTOLIN HFA) 108 (90 Base) MCG/ACT inhaler Inhale into the lungs.    Historical Provider, MD  allopurinol (ZYLOPRIM) 300 MG tablet Take 300 mg by mouth daily.    Historical Provider, MD  colchicine 0.6 MG tablet Take 1 tablet (0.6 mg total) by mouth 2 (two) times daily. Take 2 tablets for first dose then 1 pill twice daily for gout attack 04/02/16 06/04/16  [name], MD  cyclobenzaprine (FLEXERIL) 10 MG tablet Take 10 mg by mouth 3 (three) times daily. 02/15/16  Historical Provider, MD  diazepam (VALIUM) 5 MG tablet Take 5 mg by mouth 3 (three) times daily.    Historical Provider, MD  DULoxetine (CYMBALTA) 60 MG capsule Take 120 mg by mouth daily.     Historical Provider, MD  enoxaparin (LOVENOX) 120 MG/0.8ML injection Inject 0.8 mLs (120 mg total) into the skin every 12 (twelve) hours. 02/25/16   [name], MD  metoprolol tartrate (LOPRESSOR) 25 MG tablet Take 25 mg by mouth 2 (two) times daily. 07/29/14   Historical Provider, MD  Multiple Vitamin (MULTI-VITAMINS) TABS Take by mouth.    Historical Provider, MD  NIFEDICAL XL 60 MG 24 hr tablet Take 120 mg by mouth  daily.  02/21/14   Historical Provider, MD  ondansetron (ZOFRAN) 4 MG tablet Take 1 tablet (4 mg total) by mouth every 8 (eight) hours as needed for nausea or vomiting. 03/17/15   [name], MD  PROAIR HFA 108 (90 BASE) MCG/ACT inhaler Inhale 1 puff into the lungs every 6 (six) hours as needed for wheezing.  03/23/14   Historical Provider, MD  rivaroxaban (XARELTO) 20 MG TABS tablet Take 1 tablet (20 mg total) by mouth daily with supper. 05/10/16   [name], MD  tiZANidine (ZANAFLEX) 4 MG tablet Take 4 mg by mouth every 6 (six) hours as needed for muscle spasms.  07/27/14   Historical Provider, MD  valsartan-hydrochlorothiazide (DIOVAN-HCT) 320-25 MG per tablet Take 1 tablet by mouth daily.    Historical Provider, MD  VOLTAREN 1 % GEL Apply 2 g topically daily as needed. For neck and shoulder pain 02/23/14   Historical Provider, MD    Family History Family History  Problem Relation Age of Onset  . Prostate cancer Father   . Heart disease Father   . Diabetes Mother   . Heart disease Mother   . Colon cancer Maternal Uncle     dx in his 92's  . Pancreatic cancer Paternal Uncle   . Prostate cancer Paternal Uncle   . Multiple sclerosis Daughter   . Prostate cancer Paternal Uncle     Social History Social History  Substance Use Topics  . Smoking status: Current Every Day Smoker    Packs/day: 0.30    Years: 40.00    Types: Cigarettes  . Smokeless tobacco: Never Used     Comment: sometimes less.  . Alcohol use 0.0 oz/week     Comment: Occasional     Allergies   Aspirin; Celecoxib; Ibuprofen; Lyrica [pregabalin]; and Vicodin [hydrocodone-acetaminophen]   Review of Systems Review of Systems  Respiratory: Negative for shortness of breath.   Cardiovascular: Negative for chest pain.  Musculoskeletal: Positive for arthralgias and joint swelling.  All other systems reviewed and are negative.    Physical Exam Updated Vital Signs BP (!) 166/107   Pulse 110   Temp 98.4  F (36.9 C)   Resp 20   Wt 115.2 kg   SpO2 96%   BMI 30.92 kg/m   Physical Exam  Constitutional: He is oriented to person, place, and time. He appears well-developed and well-nourished. No distress.  HENT:  Head: Normocephalic and atraumatic.  Eyes: Conjunctivae are normal. Pupils are equal, round, and reactive to light. Right eye exhibits no discharge. Left eye exhibits no discharge. No scleral icterus.  Neck: Normal range of motion. Neck supple.  Cardiovascular: Normal rate and regular rhythm.  Exam reveals no gallop and no friction rub.   No murmur heard. Pulmonary/Chest: Effort normal and breath sounds normal.  No respiratory distress. He has no wheezes. He has no rales. He exhibits no tenderness.  Abdominal: Soft. He exhibits no distension. There is no tenderness.  Musculoskeletal: He exhibits no edema.  Right lower extremity: No obvious swelling or deformity. Tenderness to palpation of calf, posterior thigh, and right groin. FROM. N/V intact.   Neurological: He is alert and oriented to person, place, and time.  Skin: Skin is warm and dry.  Psychiatric: He has a normal mood and affect.  Nursing note and vitals reviewed.    ED Treatments / Results  Labs (all labs ordered are listed, but only abnormal results are displayed) Labs Reviewed - No data to display  EKG  EKG Interpretation None       Radiology No results found.  Procedures Procedures (including critical care time)  Medications Ordered in ED Medications  oxyCODONE-acetaminophen (PERCOCET/ROXICET) 5-325 MG per tablet (1 tablet  Given 05/30/16 2056)     Initial Impression / Assessment and Plan / ED Course  I have reviewed the triage vital signs and the nursing notes.  Pertinent labs & imaging results that were available during my care of the patient were reviewed by me and considered in my medical decision making (see chart for details).  Clinical Course   65 year old male presents with chronic right  leg pain. Patient is afebrile, not tachycardic or tachypneic, and not hypoxic. He is hypertensive. Will d/c with pain medicine and advised continue wearing compression stockings and follow up with [name]. Will schedule him for outpatient [place] and have him follow up with [name]. Patient is NAD, non-toxic, with stable VS. Patient is informed of clinical course, understands medical decision making process, and agrees with plan. Opportunity for questions provided and all questions answered. Return precautions given.   Final Clinical Impressions(s) / ED Diagnoses   Final diagnoses:  Chronic leg pain, right    New Prescriptions Discharge Medication List as of 05/30/2016  8:10 PM    START taking these medications   Details  oxyCODONE-acetaminophen (PERCOCET/ROXICET) 5-325 MG tablet Take 2 tablets by mouth every 4 (four) hours as needed for severe pain., Starting Thu 05/30/2016, Print         [name], PA-C 05/31/16 [id number]    Carmin Muskrat, MD 06/07/16 1002

## 2016-05-30 NOTE — ED Triage Notes (Addendum)
Pt reports having ongoing right leg pain. Last week had vascular study done at [facility] and was + for blood clots. Reports had dosage change for his blood thinners but still having pain. Went to [facility] today and was told blood work was normal and no other recommendations made. No acute distress noted at triage. Has been taking percocet pta with no relief.

## 2016-05-30 NOTE — Discharge Instructions (Signed)
Please follow up with [name]

## 2016-05-30 NOTE — Progress Notes (Signed)
Periperative Anticoagulation Management Patient was referred to clinical pharmacist for procedural management of anticoagulant. Patient reports as a walk-in today for INR check. INR 1.3 today. Patient on rivaroxaban 20 mg daily. Awaiting call back from Dr. Alphonzo Severance [phone] for further details on the planned procedure.

## 2016-05-31 ENCOUNTER — Ambulatory Visit (HOSPITAL_COMMUNITY): Payer: Medicare Other

## 2016-06-03 ENCOUNTER — Ambulatory Visit (HOSPITAL_COMMUNITY)
Admission: RE | Admit: 2016-06-03 | Discharge: 2016-06-03 | Disposition: A | Discharge status: Home / Self Care | Payer: Medicare Other | Source: Ambulatory Visit | Attending: Medical | Admitting: Medical

## 2016-06-03 ENCOUNTER — Other Ambulatory Visit: Payer: Self-pay | Admitting: Oncology

## 2016-06-03 DIAGNOSIS — M79609 Pain in unspecified limb: Secondary | ICD-10-CM | POA: Diagnosis not present

## 2016-06-03 DIAGNOSIS — Z86718 Personal history of other venous thrombosis and embolism: Secondary | ICD-10-CM

## 2016-06-03 DIAGNOSIS — I82511 Chronic embolism and thrombosis of right femoral vein: Secondary | ICD-10-CM | POA: Insufficient documentation

## 2016-06-03 DIAGNOSIS — I825Z1 Chronic embolism and thrombosis of unspecified deep veins of right distal lower extremity: Secondary | ICD-10-CM | POA: Diagnosis not present

## 2016-06-03 NOTE — Progress Notes (Signed)
Preliminary results by tech - Right Lower Ext. Venous Duplex Completed. Chronic appearing deep vein thrombosis is still present in the femoral vein and calf veins from prior study dated 02/25/16. Oda Cogan, BS, RDMS, RVT

## 2016-06-17 ENCOUNTER — Emergency Department (HOSPITAL_COMMUNITY)
Admission: EM | Admit: 2016-06-17 | Discharge: 2016-06-17 | Disposition: A | Discharge status: Home / Self Care | Payer: Medicare Other | Attending: Emergency Medicine | Admitting: Emergency Medicine

## 2016-06-17 ENCOUNTER — Emergency Department (HOSPITAL_COMMUNITY): Payer: Medicare Other

## 2016-06-17 ENCOUNTER — Encounter (HOSPITAL_COMMUNITY): Payer: Self-pay

## 2016-06-17 ENCOUNTER — Telehealth: Payer: Self-pay | Admitting: Internal Medicine

## 2016-06-17 DIAGNOSIS — F17210 Nicotine dependence, cigarettes, uncomplicated: Secondary | ICD-10-CM | POA: Diagnosis not present

## 2016-06-17 DIAGNOSIS — I10 Essential (primary) hypertension: Secondary | ICD-10-CM | POA: Diagnosis not present

## 2016-06-17 DIAGNOSIS — Y929 Unspecified place or not applicable: Secondary | ICD-10-CM | POA: Insufficient documentation

## 2016-06-17 DIAGNOSIS — S92511A Displaced fracture of proximal phalanx of right lesser toe(s), initial encounter for closed fracture: Secondary | ICD-10-CM | POA: Insufficient documentation

## 2016-06-17 DIAGNOSIS — Y939 Activity, unspecified: Secondary | ICD-10-CM | POA: Insufficient documentation

## 2016-06-17 DIAGNOSIS — W2203XA Walked into furniture, initial encounter: Secondary | ICD-10-CM | POA: Diagnosis not present

## 2016-06-17 DIAGNOSIS — Z79899 Other long term (current) drug therapy: Secondary | ICD-10-CM | POA: Insufficient documentation

## 2016-06-17 DIAGNOSIS — Z7901 Long term (current) use of anticoagulants: Secondary | ICD-10-CM | POA: Diagnosis not present

## 2016-06-17 DIAGNOSIS — Y999 Unspecified external cause status: Secondary | ICD-10-CM | POA: Insufficient documentation

## 2016-06-17 DIAGNOSIS — S90934A Unspecified superficial injury of right lesser toe(s), initial encounter: Secondary | ICD-10-CM | POA: Diagnosis present

## 2016-06-17 DIAGNOSIS — S92501A Displaced unspecified fracture of right lesser toe(s), initial encounter for closed fracture: Secondary | ICD-10-CM

## 2016-06-17 DIAGNOSIS — R52 Pain, unspecified: Secondary | ICD-10-CM

## 2016-06-17 MED ORDER — FENTANYL CITRATE (PF) 100 MCG/2ML IJ SOLN
25.0000 ug | Freq: Once | INTRAMUSCULAR | Status: AC
  Administered 2016-06-17: 25 ug via INTRAMUSCULAR
  Filled 2016-06-17: qty 2

## 2016-06-17 NOTE — ED Notes (Signed)
Ortho Tech contacted to bring [employer] post OP shoe.

## 2016-06-17 NOTE — ED Provider Notes (Signed)
[facility] DEPT Provider Note   CSN: [id number] Arrival date & time: 06/17/16  1110     History   Chief Complaint Chief Complaint  Patient presents with  . Toe Pain    pt states that he was moving last week and stubbed his R great toe now feels his Great L toe is broken     HPI [name] [name] is a 65 y.o. male the past medical history of DVT who presents to the ED today complaining of toe pain. Patient states that one week ago he was moving furniture into a new home when he stubbed his right pinky toe on a dresser. Patient states he has been experiencing increased swelling and pain in that toe since the accident. He has tried soaking it in Epsom salts and warm water without relief. Patient is also been taking his home Percocet for pain with minimal relief. He is prescribed Percocet by his PCP for bone spurs and chronic pain.  Patient's PCP is [name] and he also has an orthopedic provider, [name]. Patient has PCP appointment tomorrow.  HPI  Past Medical History:  Diagnosis Date  . Anxiety and depression   . Back pain   . Diverticulitis   . Diverticulosis   . DVT (deep venous thrombosis) ([place])   . Gout   . Hernia   . History of blood clots   . Hypertension   . Nausea and vomiting   . Neuropathy, peripheral ([place]) 05/15/2012  . Shingles     Patient Active Problem List   Diagnosis Date Noted  . Gout attack 04/02/2016  . Angina pectoris ([place]) 08/01/2014  . Hemorrhage of rectum and anus 03/28/2014  . Neuropathy, peripheral ([place]) 05/15/2012  . Pulmonary embolism ([place]) 07/11/2011  . DVT (deep venous thrombosis) ([place]) 07/11/2011  . Abdominal pain, left lower quadrant 01/29/2011  . Long term (current) use of anticoagulants 01/29/2011  . Diarrhea 01/29/2011    Past Surgical History:  Procedure Laterality Date  . APPENDECTOMY    . BACK SURGERY    . CHOLECYSTECTOMY  2008  . HERNIA REPAIR    . HIATAL HERNIA REPAIR    . LEFT HEART CATHETERIZATION WITH CORONARY  ANGIOGRAM N/A 08/02/2014   Procedure: LEFT HEART CATHETERIZATION WITH CORONARY ANGIOGRAM;  Surgeon: Laverda Page, MD;  Location: [facility] CATH LAB;  Service: Cardiovascular;  Laterality: N/A;  . MULTIPLE EXTRACTIONS WITH ALVEOLOPLASTY N/A 10/19/2015   Procedure: Extraction of tooth #'s 2,12,13 with alveoloplasty;  Surgeon: Lenn Cal, DDS;  Location: [facility];  Service: Oral Surgery;  Laterality: N/A;  . SACRAL NERVE STIMULATOR PLACEMENT  2011       Home Medications    Prior to Admission medications   Medication Sig Start Date End Date Taking? Authorizing Provider  acetaminophen-codeine (TYLENOL #3) 300-30 MG tablet Take 1 tablet by mouth every 6 (six) hours as needed for moderate pain. 04/02/16 04/02/17  [name], MD  albuterol (PROVENTIL HFA;VENTOLIN HFA) 108 (90 Base) MCG/ACT inhaler Inhale into the lungs.    Historical Provider, MD  allopurinol (ZYLOPRIM) 300 MG tablet Take 300 mg by mouth daily.    Historical Provider, MD  colchicine 0.6 MG tablet Take 1 tablet (0.6 mg total) by mouth 2 (two) times daily. Take 2 tablets for first dose then 1 pill twice daily for gout attack 04/02/16 06/04/16  [name], MD  cyclobenzaprine (FLEXERIL) 10 MG tablet Take 10 mg by mouth 3 (three) times daily. 02/15/16   Historical Provider, MD  diazepam (VALIUM) 5 MG tablet Take 5 mg by mouth 3 (three) times daily.    Historical Provider, MD  DULoxetine (CYMBALTA) 60 MG capsule Take 120 mg by mouth daily.     Historical Provider, MD  enoxaparin (LOVENOX) 120 MG/0.8ML injection Inject 0.8 mLs (120 mg total) into the skin every 12 (twelve) hours. 02/25/16   [name], MD  metoprolol tartrate (LOPRESSOR) 25 MG tablet Take 25 mg by mouth 2 (two) times daily. 07/29/14   Historical Provider, MD  Multiple Vitamin (MULTI-VITAMINS) TABS Take by mouth.    Historical Provider, MD  NIFEDICAL XL 60 MG 24 hr tablet Take 120 mg by mouth daily.  02/21/14   Historical Provider, MD  ondansetron (ZOFRAN) 4  MG tablet Take 1 tablet (4 mg total) by mouth every 8 (eight) hours as needed for nausea or vomiting. 03/17/15   [name], MD  oxyCODONE-acetaminophen (PERCOCET/ROXICET) 5-325 MG tablet Take 2 tablets by mouth every 4 (four) hours as needed for severe pain. 05/30/16   [name], PA-C  PROAIR HFA 108 (90 BASE) MCG/ACT inhaler Inhale 1 puff into the lungs every 6 (six) hours as needed for wheezing.  03/23/14   Historical Provider, MD  rivaroxaban (XARELTO) 20 MG TABS tablet Take 1 tablet (20 mg total) by mouth daily with supper. 05/10/16   [name], MD  tiZANidine (ZANAFLEX) 4 MG tablet Take 4 mg by mouth every 6 (six) hours as needed for muscle spasms.  07/27/14   Historical Provider, MD  valsartan-hydrochlorothiazide (DIOVAN-HCT) 320-25 MG per tablet Take 1 tablet by mouth daily.    Historical Provider, MD  VOLTAREN 1 % GEL Apply 2 g topically daily as needed. For neck and shoulder pain 02/23/14   Historical Provider, MD    Family History Family History  Problem Relation Age of Onset  . Prostate cancer Father   . Heart disease Father   . Diabetes Mother   . Heart disease Mother   . Colon cancer Maternal Uncle     dx in his 24's  . Pancreatic cancer Paternal Uncle   . Prostate cancer Paternal Uncle   . Multiple sclerosis Daughter   . Prostate cancer Paternal Uncle     Social History Social History  Substance Use Topics  . Smoking status: Current Every Day Smoker    Packs/day: 0.30    Years: 40.00    Types: Cigarettes  . Smokeless tobacco: Never Used     Comment: sometimes less.  . Alcohol use 0.0 oz/week     Comment: Occasional     Allergies   Aspirin; Celecoxib; Ibuprofen; Lyrica [pregabalin]; and Vicodin [hydrocodone-acetaminophen]   Review of Systems Review of Systems  All other systems reviewed and are negative.    Physical Exam Updated Vital Signs BP 166/97 (BP Location: Right Arm)   Pulse 74   Temp 98.2 F (36.8 C) (Oral)   Resp 20   Ht  6\' 4"  (1.93 m)   Wt 114.6 kg   SpO2 99%   BMI 30.75 kg/m   Physical Exam  Constitutional: He is oriented to person, place, and time. He appears well-developed and well-nourished. No distress.  HENT:  Head: Normocephalic and atraumatic.  Eyes: Conjunctivae are normal. Right eye exhibits no discharge. Left eye exhibits no discharge. No scleral icterus.  Cardiovascular: Normal rate.   Pulmonary/Chest: Effort normal.  Musculoskeletal:  TTP of right fifth metatarsal without obvious bony deformity. Mild surrounding soft tissue swelling. Intact distal pulses. Sensation intact.  Neurological: He is alert and oriented to person, place, and time. Coordination normal.  Skin: Skin is warm and dry. No rash noted. He is not diaphoretic. No erythema. No pallor.  Psychiatric: He has a normal mood and affect. His behavior is normal.  Nursing note and vitals reviewed.    ED Treatments / Results  Labs (all labs ordered are listed, but only abnormal results are displayed) Labs Reviewed - No data to display  EKG  EKG Interpretation None       Radiology Dg Foot Complete Right  Result Date: 06/17/2016 CLINICAL DATA:  Swelling involving the right foot primarily involving the fifth digit for the past 3 days. No known injury. History of DVT involving the right lower extremity. EXAM: RIGHT FOOT COMPLETE - 3+ VIEW COMPARISON:  02/25/2016 FINDINGS: There is a minimally displaced fracture involving the proximal metaphysis of the fifth digit with minimal displacement but without definitive intra-articular extension. Expected minimal amount of adjacent soft tissue swelling. No radiopaque foreign body. No additional fractures identified. Re- demonstrated moderate hallux valgus deformity with associated mild degenerative change of the first MTP joint with joint space loss, subchondral sclerosis and osteophytosis, unchanged. Hammertoe deformities are again noted involving the second through fifth digits. Note is  again made of a bipartite medial sesamoid bone about the base of the first metatarsal head. Note is again made of an os tibialis externum. Small plantar calcaneal spur. Minimal enthesopathic change involving the Achilles tendon insertion site. IMPRESSION: 1. Minimally displaced fracture involving the proximal metaphysis of the fifth digit with minimal displacement but without definitive intra-articular extension. 2. Stable chronic findings as detailed above. These results will be called to the ordering clinician or representative by the Radiologist Assistant, and communication documented in the PACS or zVision Dashboard. Electronically Signed   By: Sandi Mariscal M.D.   On: 06/17/2016 12:15    Procedures Procedures (including critical care time)  Medications Ordered in ED Medications  fentaNYL (SUBLIMAZE) injection 25 mcg (25 mcg Intramuscular Given 06/17/16 1548)     Initial Impression / Assessment and Plan / ED Course  I have reviewed the triage vital signs and the nursing notes.  Pertinent labs & imaging results that were available during my care of the patient were reviewed by me and considered in my medical decision making (see chart for details).  Clinical Course    X-ray reveals minimally displaced fracture involving the proximal metaphysis of the fifth digit. No definitive intra-articular extension. Patient given postop shoe and recommend follow-up with orthopedics. He has a PCP appointment tomorrow. Patient also takes Percocet at home for pain. Recommend rice precautions. Return precautions outlined in patient discharge instructions.  Final Clinical Impressions(s) / ED Diagnoses   Final diagnoses:  Pain  Closed displaced fracture of phalanx of lesser toe of right foot, unspecified phalanx, initial encounter    New Prescriptions Discharge Medication List as of 06/17/2016  4:21 PM       Carlos Levering, PA-C 06/17/16 2153    Julianne Rice, MD 06/23/16 [phone]

## 2016-06-17 NOTE — ED Notes (Signed)
Fentanyl 55mcg wasted in sink with Laurence Aly.

## 2016-06-17 NOTE — Discharge Instructions (Signed)
Keep leg elevated as much as possible. Take home percocet as needed for pain. Follow up with orthopedic provider as needed for re-evaluation. Return to the ED if you experience severe worsening of your symptoms, increased swelling, chest pain or SOB.

## 2016-06-17 NOTE — Progress Notes (Signed)
Orthopedic Tech Progress Note Patient Details:  [name] [birth year] [id number]  Ortho Devices Type of Ortho Device: Postop shoe/boot Ortho Device/Splint Interventions: Application   [name] 06/17/2016, 4:03 PM

## 2016-06-17 NOTE — ED Triage Notes (Signed)
Pt c/o pain to L toe after moving furniture

## 2016-06-17 NOTE — Telephone Encounter (Signed)
APT. REMINDER CALL, LMTCB °

## 2016-06-18 ENCOUNTER — Other Ambulatory Visit: Payer: Self-pay

## 2016-06-20 ENCOUNTER — Telehealth: Payer: Self-pay | Admitting: Oncology

## 2016-06-20 NOTE — Telephone Encounter (Signed)
Talked to pt - informed him, he has refills at [name]. He did not take Xarelto yesterday b/c out of medication.  Called [facility] pharmacy - stated need prior authorization. Informed Leonia Corona, stated will call today.

## 2016-06-20 NOTE — Telephone Encounter (Signed)
Refill on rivaroxaban (XARELTO) 20 MG TABS tablet

## 2016-06-21 NOTE — Telephone Encounter (Signed)
[facility] stated the Xarelto was processed successfully and patient picked it up.  Thank you!

## 2016-06-24 ENCOUNTER — Encounter: Payer: Self-pay | Admitting: Oncology

## 2016-06-24 ENCOUNTER — Ambulatory Visit (INDEPENDENT_AMBULATORY_CARE_PROVIDER_SITE_OTHER): Payer: Medicare Other | Admitting: Oncology

## 2016-06-24 VITALS — BP 147/96 | HR 110 | Temp 98.5°F | Ht 76.0 in | Wt 259.5 lb

## 2016-06-24 DIAGNOSIS — D509 Iron deficiency anemia, unspecified: Secondary | ICD-10-CM

## 2016-06-24 DIAGNOSIS — Z886 Allergy status to analgesic agent status: Secondary | ICD-10-CM

## 2016-06-24 DIAGNOSIS — I82502 Chronic embolism and thrombosis of unspecified deep veins of left lower extremity: Secondary | ICD-10-CM

## 2016-06-24 DIAGNOSIS — I2782 Chronic pulmonary embolism: Secondary | ICD-10-CM

## 2016-06-24 DIAGNOSIS — Z7901 Long term (current) use of anticoagulants: Secondary | ICD-10-CM | POA: Diagnosis not present

## 2016-06-24 DIAGNOSIS — Z885 Allergy status to narcotic agent status: Secondary | ICD-10-CM

## 2016-06-24 DIAGNOSIS — I87003 Postthrombotic syndrome without complications of bilateral lower extremity: Secondary | ICD-10-CM | POA: Diagnosis not present

## 2016-06-24 DIAGNOSIS — Z86711 Personal history of pulmonary embolism: Secondary | ICD-10-CM

## 2016-06-24 DIAGNOSIS — I82503 Chronic embolism and thrombosis of unspecified deep veins of lower extremity, bilateral: Secondary | ICD-10-CM

## 2016-06-24 DIAGNOSIS — D688 Other specified coagulation defects: Secondary | ICD-10-CM

## 2016-06-24 DIAGNOSIS — Z888 Allergy status to other drugs, medicaments and biological substances status: Secondary | ICD-10-CM

## 2016-06-24 NOTE — Patient Instructions (Signed)
To lab today Return visit 4-6 months

## 2016-06-24 NOTE — Progress Notes (Signed)
Hematology and Oncology Follow Up Visit  FABRICE HORNBACK [id number] [birth year] 65 y.o. 06/24/2016 10:37 AM   Principle Diagnosis: Encounter Diagnoses  Name Primary?  . Long term current use of anticoagulant therapy Yes  . Other chronic pulmonary embolism without acute cor pulmonale ([place])   . Chronic deep vein thrombosis (DVT) of left lower extremity, unspecified vein (HCC)   . Hypochromic-microcytic anemia   Clinical summary: 65 year old man referred  in August of 2011 for advice on long-term anticoagulation. He has a complex clotting history. He had bilateral DVTs and bilateral pulmonary emboli in 1990. Not documented in our hospital records. 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 He had an additional right lower extremity DVT in 2010 about one month after a cholecystectomy. He developed a right lower extremity superficial thrombosis when he was off Coumadin just seven days back in July of 2012 in anticipation of hernia surgery. A hypercoagulation profile revealed none of the common abnormalities associated with clotting. However, in view of his clinical history, I advised long-term Coumadin anticoagulation. We have been managing his anticoagulation in our office. 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 He has had wide fluctuations in his INR levels. He has had multiple visits to the emergency department for acute flareup of right calf pain. Venous Doppler study done on 02/25/2016 showed a new, acute, DVT in the right posterior tibial veins. Chronic thrombus noted in the right common femoral vein. Superficial thrombosis noted in varicosities in the greater saphenous vein at the level of the mid right calf. This event occurred at a time when his Coumadin was subtherapeutic. He was bridged with Lovenox and his Coumadin dose was adjusted. He had another study done on 06/03/2016 to evaluate recurrent right calf pain. No  new acute abnormalities noted. Chronic appearing changes noted in the left femoral vein and left posterior tibial veins. Chronic changes on the right. Due to wide fluctuations in his INRs, I elected to stop Coumadin and start him on Xarelto beginning on 06/21/2016.  He has chronic spine problems. He has a implanted TENS unit. He  developed progressive peripheral neuropathy. He has burning dysesthesias of his feet radiating up his legs. At times he can't feel his feet on the gas pedal. He has been tried on a number of different medications including Lyrica and Neurontin but doesn't tolerate these medications do to what he believes is significant swelling when he takes them. He has been evaluated by [facility] from neurology. One doctor told him he thought he had gout and put him on allopurinol which also cause swelling and he stopped it. His only joint symptom is in a right shoulder joint where he had previous rotator cuff surgery and he was subsequently told he has bony spurs.  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  Interim History:   He was just seen in the emergency department on October 9 when he sustained a closed displaced fracture of the phalanx of the small toe of his right foot after he jammed his foot against a piece of furniture in his house when he was trying to get to the phone to answer her call. He is using ibuprofen for pain control and we reviewed that he needs to be careful since he is also on a blood thinner. I recommended Tylenol  or extra strength Tylenol as an alternative. He still gets occasional dyspnea. He uses an albuterol inhaler on an as needed basis. No ischemic quality chest pain. he continues to have left pectoral pain related to chronic postherpetic neuralgia. He denied any hematochezia, melena, or hematuria. No  change in bowel habit. Appetite is overall good and weight is stable. He has chronic discomfort of his lower extremities left greater than right related to chronic postphlebitic syndrome.   Medications: reviewed  Allergies:  Allergies  Allergen Reactions  . Aspirin Other (See Comments)    ulcers  . Celecoxib Other (See Comments)    Stomach irritation  . Ibuprofen Other (See Comments)    ulcers  . Lyrica [Pregabalin] Swelling  . Vicodin [Hydrocodone-Acetaminophen]     Makes me mean     Review of Systems: See interim history. Remaining ROS negative:   Physical Exam: Blood pressure (!) 147/96, pulse (!) 110, temperature 98.5 F (36.9 C), temperature source Oral, height 6\' 4"  (1.93 m), weight 259 lb 8 oz (117.7 kg), SpO2 100 %. Wt Readings from Last 3 Encounters:  06/24/16 259 lb 8 oz (117.7 kg)  06/17/16 252 lb 9.6 oz (114.6 kg)  05/30/16 254 lb (115.2 kg)     General appearance: Well-nourished African-American man 6 feet 4 inches tall [name]: Pharynx no erythema, exudate, mass, or ulcer. No thyromegaly or thyroid nodules Lymph nodes: No cervical, supraclavicular, or axillary lymphadenopathy Breasts: Chronic tenderness to palpation over the left pectoral region. No breast masses. Lungs: Clear to auscultation, resonant to percussion throughout Heart: Regular rhythm, no murmur, no gallop, no rub, no click, no edema Abdomen: Soft, nontender, normal bowel sounds, no mass, no organomegaly. Vertical midline scar status post previous hiatus hernia surgery. Extremities: No edema, no calf tenderness. Chronic asymmetric swelling right calf versus left. Right calf 40 cm, left 37. Right ankle 25 cm, left 24 cm. Musculoskeletal: no joint deformities. Recent fracture small toe right foot. GU:  Vascular: Carotid pulses 2+, no bruits,  Neurologic: Alert, oriented, PERRLA, arcus senilis, optic discs sharp and vessels normal, no hemorrhage or exudate, cranial nerves grossly normal, motor  strength 5 over 5, reflexes absent symmetric lower extremities, 1+ symmetric at the biceps, upper body coordination normal, gait normal, Skin: No rash or ecchymosis  Lab Results: CBC W/Diff    Component Value Date/Time   WBC 4.5 04/02/2016 1004   RBC 5.76 04/02/2016 1004   HGB 13.2 04/02/2016 1004   HGB 13.1 04/04/2014 1110   HCT 40.5 04/02/2016 1004   HCT 41.7 04/04/2014 1110   PLT 244 04/02/2016 1004   PLT 210 04/04/2014 1110   MCV 70.3 (L) 04/02/2016 1004   MCV 72.4 (L) 04/04/2014 1110   MCH 22.9 (L) 04/02/2016 1004   MCHC 32.6 04/02/2016 1004   RDW 16.4 (H) 04/02/2016 1004   RDW 16.5 (H) 04/04/2014 1110   LYMPHSABS 1.9 04/02/2016 1004   LYMPHSABS 1.5 04/04/2014 1110   MONOABS 0.5 04/02/2016 1004   MONOABS 0.5 04/04/2014 1110   EOSABS 0.1 04/02/2016 1004   EOSABS 0.0 04/04/2014 1110   BASOSABS 0.0 04/02/2016 1004   BASOSABS 0.1 04/04/2014 1110     Chemistry      Component Value Date/Time   NA 142 05/10/2016 0951   NA 143 04/04/2014 1116   K 3.9 05/10/2016 0951   K 3.7 04/04/2014 1116   CL 98 05/10/2016 0951   CO2 24 05/10/2016 0951   CO2 30 (H) 04/04/2014 1116   BUN 13 05/10/2016 0951  BUN 10.1 04/04/2014 1116   CREATININE 0.91 05/10/2016 0951   CREATININE 1.0 04/04/2014 1116      Component Value Date/Time   CALCIUM 10.0 05/10/2016 0951   CALCIUM 9.3 04/04/2014 1116   ALKPHOS 61 05/10/2016 0951   ALKPHOS 45 04/04/2014 1116   AST 16 05/10/2016 0951   AST 14 04/04/2014 1116   ALT 15 05/10/2016 0951   ALT 10 04/04/2014 1116   BILITOT 0.3 05/10/2016 0951   BILITOT 0.48 04/04/2014 1116       Radiological Studies: Dg Foot Complete Right  Result Date: 06/17/2016 CLINICAL DATA:  Swelling involving the right foot primarily involving the fifth digit for the past 3 days. No known injury. History of DVT involving the right lower extremity. EXAM: RIGHT FOOT COMPLETE - 3+ VIEW COMPARISON:  02/25/2016 FINDINGS: There is a minimally displaced fracture involving the  proximal metaphysis of the fifth digit with minimal displacement but without definitive intra-articular extension. Expected minimal amount of adjacent soft tissue swelling. No radiopaque foreign body. No additional fractures identified. Re- demonstrated moderate hallux valgus deformity with associated mild degenerative change of the first MTP joint with joint space loss, subchondral sclerosis and osteophytosis, unchanged. Hammertoe deformities are again noted involving the second through fifth digits. Note is again made of a bipartite medial sesamoid bone about the base of the first metatarsal head. Note is again made of an os tibialis externum. Small plantar calcaneal spur. Minimal enthesopathic change involving the Achilles tendon insertion site. IMPRESSION: 1. Minimally displaced fracture involving the proximal metaphysis of the fifth digit with minimal displacement but without definitive intra-articular extension. 2. Stable chronic findings as detailed above. These results will be called to the ordering clinician or representative by the Radiologist Assistant, and communication documented in the PACS or zVision Dashboard. Electronically Signed   By: Sandi Mariscal M.D.   On: 06/17/2016 12:15    Impression:  #1. 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 Status post placement of a caval filter. He is on chronic anticoagulation recently changed in October 2017 from warfarin to Xarelto. I have reevaluated my previous decision to keep him on warfarin in view of chronic symptoms and fluctuations in his INRs.  #2. Chronic lower extremity pain right greater than left secondary to a postphlebitic syndrome  #3. Chronic left pectoral pain secondary to postherpetic neuralgia  #4. Chronic back pain status post placement of a TENS stimulator.  #5. Chronic neuropathy of the lower extremities related to #4.  #6. Essential hypertension  #7.  Obstructive airway disease  #8. Gout  CC: Patient Care Team: Elwyn Reach, MD as PCP - General (Internal Medicine)   [name], MD 10/16/201710:37 AM

## 2016-06-24 NOTE — Telephone Encounter (Addendum)
Patient's Xarelto has been approved 06/20/2016 thru 09/08/2017 per fax from [facility].  Sander Nephew, RN 06/24/2016 4:49 PM.

## 2016-06-25 LAB — CBC WITH DIFFERENTIAL/PLATELET
Basophils Absolute: 0 10*3/uL (ref 0.0–0.2)
Basos: 1 %
EOS (ABSOLUTE): 0 10*3/uL (ref 0.0–0.4)
Eos: 0 %
Hematocrit: 43 % (ref 37.5–51.0)
Hemoglobin: 13.7 g/dL (ref 12.6–17.7)
Immature Grans (Abs): 0 10*3/uL (ref 0.0–0.1)
Immature Granulocytes: 0 %
Lymphocytes Absolute: 1.4 10*3/uL (ref 0.7–3.1)
Lymphs: 29 %
MCH: 23.1 pg — ABNORMAL LOW (ref 26.6–33.0)
MCHC: 31.9 g/dL (ref 31.5–35.7)
MCV: 73 fL — ABNORMAL LOW (ref 79–97)
Monocytes Absolute: 0.5 10*3/uL (ref 0.1–0.9)
Monocytes: 10 %
Neutrophils Absolute: 2.8 10*3/uL (ref 1.4–7.0)
Neutrophils: 60 %
Platelets: 273 10*3/uL (ref 150–379)
RBC: 5.92 x10E6/uL — ABNORMAL HIGH (ref 4.14–5.80)
RDW: 15.6 % — ABNORMAL HIGH (ref 12.3–15.4)
WBC: 4.7 10*3/uL (ref 3.4–10.8)

## 2016-06-25 LAB — FERRITIN: Ferritin: 61 ng/mL (ref 30–400)

## 2016-06-27 ENCOUNTER — Telehealth: Payer: Self-pay | Admitting: *Deleted

## 2016-06-27 NOTE — Telephone Encounter (Signed)
Pt called / informed "red blood count and iron levels OK right now " per Dr Beryle Beams. Voiced no questions.

## 2016-06-27 NOTE — Telephone Encounter (Signed)
-----   Message from [name], MD sent at 06/25/2016  5:01 PM EDT ----- Call pt: red blood count and iron levels OK right now

## 2016-07-03 ENCOUNTER — Other Ambulatory Visit: Payer: Self-pay | Admitting: Oncology

## 2016-07-04 NOTE — Telephone Encounter (Signed)
Per chart coumadin was discontinued and started on Xarelto. Is this correct? Thanks

## 2016-07-24 ENCOUNTER — Ambulatory Visit (INDEPENDENT_AMBULATORY_CARE_PROVIDER_SITE_OTHER): Payer: Medicare Other | Admitting: Orthopedic Surgery

## 2016-07-29 ENCOUNTER — Telehealth: Payer: Self-pay | Admitting: *Deleted

## 2016-07-29 NOTE — Telephone Encounter (Signed)
Need to know exactly what he wants! Also - need address for Dr Dorothyann Peng - no answer when I called Friday and no answering machine!

## 2016-07-29 NOTE — Telephone Encounter (Signed)
Returned pt's call - stated needs letter sent to Dr Catha Gosselin office re: DOT physical. 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 Spoke to Dr Elie Confer - he talked to someone this morning; informed them "pt can drive while taking Xarelto". But if there's other reasons why he cannot drive; Dr Elie Confer stated it's up to his doctors.

## 2016-07-31 NOTE — Telephone Encounter (Signed)
Talked to [name] [name], who has talked to pt also; she will be faxing info pt needs.

## 2016-07-31 NOTE — Telephone Encounter (Signed)
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  Stated this was done last year for him. Fax# [phone].

## 2016-08-02 ENCOUNTER — Other Ambulatory Visit: Payer: Self-pay | Admitting: Oncology

## 2016-08-06 ENCOUNTER — Ambulatory Visit (INDEPENDENT_AMBULATORY_CARE_PROVIDER_SITE_OTHER): Payer: Medicare Other | Admitting: Orthopedic Surgery

## 2016-08-06 ENCOUNTER — Encounter (INDEPENDENT_AMBULATORY_CARE_PROVIDER_SITE_OTHER): Payer: Self-pay | Admitting: Orthopedic Surgery

## 2016-08-06 DIAGNOSIS — M19012 Primary osteoarthritis, left shoulder: Secondary | ICD-10-CM

## 2016-08-06 DIAGNOSIS — M25512 Pain in left shoulder: Secondary | ICD-10-CM | POA: Diagnosis not present

## 2016-08-06 DIAGNOSIS — M19019 Primary osteoarthritis, unspecified shoulder: Secondary | ICD-10-CM | POA: Diagnosis not present

## 2016-08-06 DIAGNOSIS — M542 Cervicalgia: Secondary | ICD-10-CM

## 2016-08-06 MED ORDER — BUPIVACAINE HCL 0.25 % IJ SOLN
0.6600 mL | INTRAMUSCULAR | Status: AC | PRN
  Administered 2016-08-06: .66 mL via INTRA_ARTICULAR

## 2016-08-06 MED ORDER — TRIAMCINOLONE ACETONIDE 40 MG/ML IJ SUSP
20.0000 mg | INTRAMUSCULAR | Status: AC | PRN
  Administered 2016-08-06: 20 mg via INTRA_ARTICULAR

## 2016-08-06 MED ORDER — BUPIVACAINE HCL 0.5 % IJ SOLN
9.0000 mL | INTRAMUSCULAR | Status: AC | PRN
  Administered 2016-08-06: 9 mL via INTRA_ARTICULAR

## 2016-08-06 MED ORDER — METHYLPREDNISOLONE ACETATE 40 MG/ML IJ SUSP
40.0000 mg | INTRAMUSCULAR | Status: AC | PRN
  Administered 2016-08-06: 40 mg via INTRA_ARTICULAR

## 2016-08-06 MED ORDER — LIDOCAINE HCL 1 % IJ SOLN
3.0000 mL | INTRAMUSCULAR | Status: AC | PRN
  Administered 2016-08-06: 3 mL

## 2016-08-06 MED ORDER — LIDOCAINE HCL 1 % IJ SOLN
5.0000 mL | INTRAMUSCULAR | Status: AC | PRN
  Administered 2016-08-06: 5 mL

## 2016-08-06 NOTE — Progress Notes (Signed)
Office Visit Note   Patient: [name] [name]           Date of Birth: 01-17-1951           MRN: [id number] Visit Date: 08/06/2016 Requested by: Elwyn Reach, MD [place], [place] [id number] PCP: [name], MD  Subjective: Chief Complaint  Patient presents with  . Left Shoulder - Pain  . Neck - Pain    HPI [name] [name] is a 55 show patient with left shoulder pain.  [name] [name] is left-hand dominant.  Since I've seen him [name] [name] is had a CT scan which shows intact rotator cuff intact biceps tendon and acromioclavicular joint arthritis and os acromiale patient reports swelling in the left arm with some radiation down to the fingers.  [name] [name] reports some numbness as well as neck pain.  [name] [name] is currently off warfarin and the bones were also.  Localizes most of his pain to the superior anterior aspect of the shoulder.  [name] [name] does have complicating factors with his neck..              Review of Systems All systems reviewed are negative as they relate to the chief complaint within the history of present illness.  Patient denies  fevers or chills.   Assessment & Plan: Visit Diagnoses: No diagnosis found.  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  Biceps tendon may also be at play.  Plan today was for ultrasound-guided injection into the before meals joint as well as 9 mL of Marcaine was cc Depo-Medrol injection into the subacromial space.  Because [name] [name] is on xarelto and because [name] [name] is coming up on 6 months out from blood clot formation in the legs we could consider operative intervention.  [name] [name] is going to call me next week and we can talk about how [name] [name] feels after the injections.  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  Follow-Up Instructions: No Follow-up on file.   Orders:  No orders of the defined types were placed in this encounter.  No orders of the defined types were placed in this encounter.     Procedures: Large Joint Inj Date/Time: 08/06/2016 11:05 AM Performed by: Meredith Pel Authorized by: Meredith Pel   Consent Given by:  Patient Site marked: the procedure site was marked   Timeout: prior to procedure the correct patient, procedure, and site was verified   Indications:  Pain and diagnostic evaluation Location:  Shoulder Site:  L subacromial bursa Prep: patient was prepped and draped in usual sterile fashion   Needle Size:  18 G Needle Length:  1.5 inches Approach:  Posterior Ultrasound Guidance: No   Fluoroscopic Guidance: No   Arthrogram: No   Medications:  5 mL lidocaine 1 %; 9 mL bupivacaine 0.5 %; 40 mg methylPREDNISolone acetate 40 MG/ML Aspiration Attempted: No   Patient tolerance:  Patient tolerated the procedure well with no immediate complications Medium Joint Inj Date/Time: 08/06/2016 11:05 AM Performed by: Meredith Pel Authorized by: Meredith Pel   Consent Given by:  Patient Site marked: the procedure site was marked   Timeout: prior to procedure the correct patient, procedure, and site was verified   Indications:  Pain and diagnostic evaluation Location:  Shoulder Site:  L acromioclavicular Prep: patient was prepped and draped in usual sterile fashion   Needle Size:  25 G Needle Length:  1.5 inches  Approach:  Superior Ultrasound Guided: Yes   Fluoroscopic Guidance: No   Medications:  3 mL lidocaine 1 %; 20 mg triamcinolone acetonide 40 MG/ML; 0.66 mL bupivacaine 0.25 % Aspiration Attempted: No   Patient tolerance:  Patient tolerated the procedure well with no immediate complications       Clinical Data: No additional findings.  Objective: Vital Signs: There were no vitals taken for this visit.  Physical Exam  Constitutional: [name] [name] appears well-developed.  HENT:  Head: Normocephalic.  Eyes: EOM are normal.  Neck: Normal range of motion.  Cardiovascular: Normal rate.   Pulmonary/Chest: Effort normal.    Neurological: [name] [name] is alert.  Skin: Skin is warm.  Psychiatric: [name] [name] has a normal mood and affect.    Ortho Exam examination of the left shoulder demonstrates full active and passive range of motion with good rotator cuff strength.  Does have asymmetric acromioclavicular joint tenderness left versus right.  O'Brien's testing speed's testing negative on the left compared to the right.  Negative apprehension relocation testing left versus right.  No other masses lymph adenopathy or skin changes noted in the left shoulder girdle region.  Impingement signs positive on the left  Specialty Comments:  No specialty comments available.  Imaging: No results found.   PMFS History: Patient Active Problem List   Diagnosis Date Noted  . Gout attack 04/02/2016  . Angina pectoris ([place]) 08/01/2014  . Hemorrhage of rectum and anus 03/28/2014  . Neuropathy, peripheral ([place]) 05/15/2012  . Pulmonary embolism ([place]) 07/11/2011  . DVT (deep venous thrombosis) ([place]) 07/11/2011  . Abdominal pain, left lower quadrant 01/29/2011  . Long term current use of anticoagulant therapy 01/29/2011  . Diarrhea 01/29/2011   Past Medical History:  Diagnosis Date  . Anxiety and depression   . Back pain   . Diverticulitis   . Diverticulosis   . DVT (deep venous thrombosis) ([place])   . Gout   . Hernia   . History of blood clots   . Hypertension   . Nausea and vomiting   . Neuropathy, peripheral ([place]) 05/15/2012  . Shingles     Family History  Problem Relation Age of Onset  . Prostate cancer Father   . Heart disease Father   . Diabetes Mother   . Heart disease Mother   . Colon cancer Maternal Uncle     dx in his 45's  . Pancreatic cancer Paternal Uncle   . Prostate cancer Paternal Uncle   . Multiple sclerosis Daughter   . Prostate cancer Paternal Uncle     Past Surgical History:  Procedure Laterality Date  . APPENDECTOMY    . BACK SURGERY    . CHOLECYSTECTOMY  2008  . HERNIA REPAIR    . HIATAL HERNIA  REPAIR    . LEFT HEART CATHETERIZATION WITH CORONARY ANGIOGRAM N/A 08/02/2014   Procedure: LEFT HEART CATHETERIZATION WITH CORONARY ANGIOGRAM;  Surgeon: Laverda Page, MD;  Location: [facility] CATH LAB;  Service: Cardiovascular;  Laterality: N/A;  . MULTIPLE EXTRACTIONS WITH ALVEOLOPLASTY N/A 10/19/2015   Procedure: Extraction of tooth #'s 2,12,13 with alveoloplasty;  Surgeon: Lenn Cal, DDS;  Location: [facility];  Service: Oral Surgery;  Laterality: N/A;  . SACRAL NERVE STIMULATOR PLACEMENT  2011   Social History   Occupational History  . unemployed Other  .  Retired   Social History Main Topics  . Smoking status: Current Every Day Smoker    Packs/day: 0.50    Years: 40.00    Types: Cigarettes  .  Smokeless tobacco: Never Used     Comment: sometimes less.  . Alcohol use 0.0 oz/week     Comment: Occasional  . Drug use: No     Comment: Stopped about month ago.  Marland Kitchen Sexual activity: Not on file

## 2016-08-19 ENCOUNTER — Telehealth (INDEPENDENT_AMBULATORY_CARE_PROVIDER_SITE_OTHER): Payer: Self-pay | Admitting: Orthopedic Surgery

## 2016-08-19 NOTE — Telephone Encounter (Signed)
Patient called advised his shoulder is still hurting. Dr Beryle Beams asked that Dr Marlou Sa contact him. The number to contact [name] is [phone]. The number to contact patient is [phone]

## 2016-08-21 NOTE — Telephone Encounter (Signed)
I called lmom pls send back to me thx

## 2016-08-21 NOTE — Telephone Encounter (Signed)
Back to you

## 2016-08-21 NOTE — Telephone Encounter (Signed)
Please see message below

## 2016-08-22 NOTE — Telephone Encounter (Signed)
I talked to Dr. Jim Desanctis.  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  I talked with the patient about this as well including the risks and benefits of surgical treatment for his left shoulder.  Plan at this time would be for arthroscopy subacromial decompression and possible distal clavicle excision possible biceps tenodesis.  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  Patient understands about the surgery wishes to proceed we will get that set up in the near future

## 2016-08-22 NOTE — Telephone Encounter (Signed)
Surgery order given to [facility].

## 2016-08-28 ENCOUNTER — Telehealth (INDEPENDENT_AMBULATORY_CARE_PROVIDER_SITE_OTHER): Payer: Self-pay | Admitting: Orthopedic Surgery

## 2016-08-28 MED ORDER — OXYCODONE-ACETAMINOPHEN 5-325 MG PO TABS: ORAL_TABLET | ORAL | 0 refills | Status: DC

## 2016-08-28 NOTE — Telephone Encounter (Signed)
Ok to rf pls cla htx

## 2016-08-28 NOTE — Telephone Encounter (Signed)
Rx request 

## 2016-08-28 NOTE — Telephone Encounter (Signed)
Pt requesting rx for percocet. Pt having surgery the Monday after christmas. Pt is completely out of percocet. Pt number is [phone]

## 2016-08-28 NOTE — Telephone Encounter (Signed)
IC pt and advised Rx ready to pickup

## 2016-09-01 ENCOUNTER — Other Ambulatory Visit: Payer: Self-pay | Admitting: Oncology

## 2016-09-06 ENCOUNTER — Telehealth (INDEPENDENT_AMBULATORY_CARE_PROVIDER_SITE_OTHER): Payer: Self-pay | Admitting: Orthopedic Surgery

## 2016-09-06 ENCOUNTER — Other Ambulatory Visit (INDEPENDENT_AMBULATORY_CARE_PROVIDER_SITE_OTHER): Payer: Self-pay | Admitting: Family

## 2016-09-06 MED ORDER — OXYCODONE-ACETAMINOPHEN 5-325 MG PO TABS: ORAL_TABLET | ORAL | 0 refills | Status: DC

## 2016-09-06 NOTE — Telephone Encounter (Signed)
Pt requesting refill of percocet. Pt number is [phone]

## 2016-09-06 NOTE — Telephone Encounter (Signed)
Can you advise since Marlou Sa isn't here? Or wait for Marlou Sa next week?

## 2016-09-06 NOTE — Telephone Encounter (Signed)
Ok

## 2016-09-06 NOTE — Telephone Encounter (Signed)
Patient aware done

## 2016-09-16 DIAGNOSIS — M7552 Bursitis of left shoulder: Secondary | ICD-10-CM | POA: Diagnosis not present

## 2016-09-16 DIAGNOSIS — M19012 Primary osteoarthritis, left shoulder: Secondary | ICD-10-CM | POA: Diagnosis not present

## 2016-09-23 ENCOUNTER — Ambulatory Visit (INDEPENDENT_AMBULATORY_CARE_PROVIDER_SITE_OTHER): Payer: Medicare Other | Admitting: Orthopedic Surgery

## 2016-09-23 ENCOUNTER — Other Ambulatory Visit: Payer: Self-pay | Admitting: Oncology

## 2016-09-23 ENCOUNTER — Encounter (INDEPENDENT_AMBULATORY_CARE_PROVIDER_SITE_OTHER): Payer: Self-pay | Admitting: Orthopedic Surgery

## 2016-09-23 DIAGNOSIS — Z7901 Long term (current) use of anticoagulants: Secondary | ICD-10-CM

## 2016-09-23 DIAGNOSIS — I82401 Acute embolism and thrombosis of unspecified deep veins of right lower extremity: Secondary | ICD-10-CM

## 2016-09-23 DIAGNOSIS — M19019 Primary osteoarthritis, unspecified shoulder: Secondary | ICD-10-CM

## 2016-09-23 MED ORDER — OXYCODONE-ACETAMINOPHEN 5-325 MG PO TABS: 2.0000 | ORAL_TABLET | Freq: Four times a day (QID) | ORAL | 0 refills | Status: DC | PRN

## 2016-09-27 NOTE — Progress Notes (Signed)
Post-Op Visit Note   Patient: [name] [name]           Date of Birth: 08/23/1951           MRN: [id number] Visit Date: 09/23/2016 PCP: [name], MD   Assessment & Plan:  Chief Complaint:  Chief Complaint  Patient presents with  . Left Shoulder - Routine Post Op   Visit Diagnoses: No diagnosis found.  Plan: [name] is a 66 year old patient who is now a week out from left shoulder arthroscopy distal clavicle excision.  In doing reasonably well.  Taking Percocet which is helping him only marginally.  He has been on pain medicine for a long time.  On exam he actually has excellent range of motion.  Portal sutures are removed.  I'll send him to physical therapy to work on some strengthening in's and motion exercises.  The right and prescription for Brand name therapy for Percocet which is helped him more than non-brand-name Percocet.  We'll see him back in 4 weeks for clinical recheck  Follow-Up Instructions: Return in about 4 weeks (around 10/21/2016).   Orders:  No orders of the defined types were placed in this encounter.  Meds ordered this encounter  Medications  . oxyCODONE-acetaminophen (PERCOCET/ROXICET) 5-325 MG tablet    Sig: Take 2 tablets by mouth every 6 (six) hours as needed for severe pain (Brand name only please). Take 1 tablet as needed po q 8-12 hr prn severe pain.    Dispense:  50 tablet    Refill:  0    Imaging: No results found.  PMFS History: Patient Active Problem List   Diagnosis Date Noted  . Gout attack 04/02/2016  . Angina pectoris ([place]) 08/01/2014  . Hemorrhage of rectum and anus 03/28/2014  . Neuropathy, peripheral ([place]) 05/15/2012  . Pulmonary embolism ([place]) 07/11/2011  . DVT (deep venous thrombosis) ([place]) 07/11/2011  . Abdominal pain, left lower quadrant 01/29/2011  . Long term current use of anticoagulant therapy 01/29/2011  . Diarrhea 01/29/2011   Past Medical History:  Diagnosis Date  . Anxiety and depression   . Back pain   .  Diverticulitis   . Diverticulosis   . DVT (deep venous thrombosis) ([place])   . Gout   . Hernia   . History of blood clots   . Hypertension   . Nausea and vomiting   . Neuropathy, peripheral ([place]) 05/15/2012  . Shingles     Family History  Problem Relation Age of Onset  . Prostate cancer Father   . Heart disease Father   . Diabetes Mother   . Heart disease Mother   . Colon cancer Maternal Uncle     dx in his 42's  . Pancreatic cancer Paternal Uncle   . Prostate cancer Paternal Uncle   . Multiple sclerosis Daughter   . Prostate cancer Paternal Uncle     Past Surgical History:  Procedure Laterality Date  . APPENDECTOMY    . BACK SURGERY    . CHOLECYSTECTOMY  2008  . HERNIA REPAIR    . HIATAL HERNIA REPAIR    . LEFT HEART CATHETERIZATION WITH CORONARY ANGIOGRAM N/A 08/02/2014   Procedure: LEFT HEART CATHETERIZATION WITH CORONARY ANGIOGRAM;  Surgeon: Laverda Page, MD;  Location: [facility] CATH LAB;  Service: Cardiovascular;  Laterality: N/A;  . MULTIPLE EXTRACTIONS WITH ALVEOLOPLASTY N/A 10/19/2015   Procedure: Extraction of tooth #'s 2,12,13 with alveoloplasty;  Surgeon: Lenn Cal, DDS;  Location: [facility];  Service: Oral Surgery;  Laterality: N/A;  .  SACRAL NERVE STIMULATOR PLACEMENT  2011   Social History   Occupational History  . unemployed Other  .  Retired   Social History Main Topics  . Smoking status: Current Every Day Smoker    Packs/day: 0.50    Years: 40.00    Types: Cigarettes  . Smokeless tobacco: Never Used     Comment: sometimes less.  . Alcohol use 0.0 oz/week     Comment: Occasional  . Drug use: No     Comment: Stopped about month ago.  Marland Kitchen Sexual activity: Not on file

## 2016-10-01 ENCOUNTER — Other Ambulatory Visit: Payer: Self-pay | Admitting: Oncology

## 2016-10-08 ENCOUNTER — Telehealth (INDEPENDENT_AMBULATORY_CARE_PROVIDER_SITE_OTHER): Payer: Self-pay | Admitting: Orthopedic Surgery

## 2016-10-08 NOTE — Telephone Encounter (Signed)
PATIENT CALLED NEEDING A REFILL ON PERCOCET. Cb# [phone]

## 2016-10-09 MED ORDER — OXYCODONE-ACETAMINOPHEN 5-325 MG PO TABS: 1.0000 | ORAL_TABLET | Freq: Four times a day (QID) | ORAL | 0 refills | Status: DC | PRN

## 2016-10-09 NOTE — Telephone Encounter (Signed)
Ok to rf at lower frequency plds clal thx

## 2016-10-09 NOTE — Telephone Encounter (Signed)
IC patient and advised Rx ready

## 2016-10-09 NOTE — Addendum Note (Signed)
Addended byBrand Males on: 10/09/2016 01:31 PM   Modules accepted: Orders

## 2016-10-09 NOTE — Telephone Encounter (Signed)
Rx request 

## 2016-10-21 ENCOUNTER — Encounter (INDEPENDENT_AMBULATORY_CARE_PROVIDER_SITE_OTHER): Payer: Self-pay | Admitting: Orthopedic Surgery

## 2016-10-21 ENCOUNTER — Ambulatory Visit (INDEPENDENT_AMBULATORY_CARE_PROVIDER_SITE_OTHER): Payer: Medicare Other | Admitting: Orthopedic Surgery

## 2016-10-21 DIAGNOSIS — M75122 Complete rotator cuff tear or rupture of left shoulder, not specified as traumatic: Secondary | ICD-10-CM

## 2016-10-21 MED ORDER — OXYCODONE-ACETAMINOPHEN 10-325 MG PO TABS: 1.0000 | ORAL_TABLET | Freq: Three times a day (TID) | ORAL | 0 refills | Status: DC | PRN

## 2016-10-21 MED ORDER — DICLOFENAC SODIUM 2 % TD SOLN: 2.0000 | Freq: Two times a day (BID) | TRANSDERMAL | 1 refills | Status: DC

## 2016-10-21 NOTE — Progress Notes (Signed)
Post-Op Visit Note   Patient: [name] [name]           Date of Birth: 1951-05-18           MRN: [id number] Visit Date: 10/21/2016 PCP: [name] Merino, MD   Assessment & Plan:  Chief Complaint:  Chief Complaint  Patient presents with  . Left Shoulder - Routine Post Op   Visit Diagnoses:  1. Complete rotator cuff tear of left shoulder     Plan: [name] [name] is a 66 year old patient who is now about 4 weeks out left shoulder arthroscopy distal clavicle excision acromioplasty.  Having continued left shoulder pain which she describes going back to the shoulder blade as well as radiating down below his elbow.  He's taking oxycodone 5-25 without relief.  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  He tried lidocaine patch.  He feels like that over the past several day there is been a bee sting sensation in the left part of his neck.  On exam is Fairly reasonable passive range of motion.  Motor sensory function to the hand is intact.  Neck range of motion is painful.  Impression is that I think we've done about all we can do for the shoulder.  I suspect with the localization of pain to the shoulder blade and with radicular symptoms going down below the elbow into the wrist along with the stinging sensations in his neck that the residual pain that he has is likely from the neck may not all be related to the shoulder.  I'm in her start him in physical therapy prescribed for him Percocet 06/11/2024 and send him to Dr. Ernestina Patches for consult regarding epidural steroid injection into his neck.  He is on blood thinners and that will have to be a consideration.  I'll check back with him in about 2 months but I don't think there is anything more to be done in regards to the shoulder  Follow-Up Instructions: No Follow-up on file.   Orders:  No orders of the defined types were  placed in this encounter.  No orders of the defined types were placed in this encounter.   Imaging: No results found.  PMFS History: Patient Active Problem List   Diagnosis Date Noted  . Complete rotator cuff tear of left shoulder 10/21/2016  . Gout attack 04/02/2016  . Angina pectoris ([place]) 08/01/2014  . Hemorrhage of rectum and anus 03/28/2014  . Neuropathy, peripheral ([place]) 05/15/2012  . Pulmonary embolism ([place]) 07/11/2011  . DVT (deep venous thrombosis) ([place]) 07/11/2011  . Abdominal pain, left lower quadrant 01/29/2011  . Long term current use of anticoagulant therapy 01/29/2011  . Diarrhea 01/29/2011   Past Medical History:  Diagnosis Date  . Anxiety and depression   . Back pain   . Diverticulitis   . Diverticulosis   . DVT (deep venous thrombosis) ([place])   . Gout   . Hernia   . History of blood clots   . Hypertension   . Nausea and vomiting   . Neuropathy, peripheral ([place]) 05/15/2012  . Shingles     Family History  Problem Relation Age of Onset  . Prostate cancer Father   . Heart disease Father   . Diabetes Mother   . Heart disease Mother   . Colon cancer Maternal Uncle  dx in his 95's  . Pancreatic cancer Paternal Uncle   . Prostate cancer Paternal Uncle   . Multiple sclerosis Daughter   . Prostate cancer Paternal Uncle     Past Surgical History:  Procedure Laterality Date  . APPENDECTOMY    . BACK SURGERY    . CHOLECYSTECTOMY  2008  . HERNIA REPAIR    . HIATAL HERNIA REPAIR    . LEFT HEART CATHETERIZATION WITH CORONARY ANGIOGRAM N/A 08/02/2014   Procedure: LEFT HEART CATHETERIZATION WITH CORONARY ANGIOGRAM;  Surgeon: Laverda Page, MD;  Location: [facility] CATH LAB;  Service: Cardiovascular;  Laterality: N/A;  . MULTIPLE EXTRACTIONS WITH ALVEOLOPLASTY N/A 10/19/2015   Procedure: Extraction of tooth #'s 2,12,13 with alveoloplasty;  Surgeon: Lenn Cal, DDS;  Location: [facility];  Service: Oral Surgery;  Laterality: N/A;  . SACRAL NERVE STIMULATOR  PLACEMENT  2011   Social History   Occupational History  . unemployed Other  .  Retired   Social History Main Topics  . Smoking status: Current Every Day Smoker    Packs/day: 0.50    Years: 40.00    Types: Cigarettes  . Smokeless tobacco: Never Used     Comment: sometimes less.  . Alcohol use 0.0 oz/week     Comment: Occasional  . Drug use: No     Comment: Stopped about month ago.  Marland Kitchen Sexual activity: Not on file

## 2016-10-30 ENCOUNTER — Telehealth (INDEPENDENT_AMBULATORY_CARE_PROVIDER_SITE_OTHER): Payer: Self-pay | Admitting: Orthopedic Surgery

## 2016-10-30 NOTE — Telephone Encounter (Signed)
Patient called needing Rx for (pennsaid) called into his pharmacy.  Patient use [facility] on [address]. The number to contact patient is [phone]

## 2016-10-30 NOTE — Telephone Encounter (Signed)
IC pharm and had to leave info, they will check on this Rx that was sent and call me back. I will update pt once I s/w them again.

## 2016-10-31 ENCOUNTER — Other Ambulatory Visit: Payer: Self-pay | Admitting: Oncology

## 2016-10-31 NOTE — Telephone Encounter (Signed)
IC [place], [phone], s/w Tiffany, pt is ineligible for this Rx through them as he has medicare. I will call pt and discuss.

## 2016-10-31 NOTE — Telephone Encounter (Signed)
IC patient, discussed, he says pennsaid has helped him a lot, and asks for more samples, I will put samples up front and also get him a sample bottle from rep and call him next week when ready.

## 2016-11-07 ENCOUNTER — Encounter: Payer: Self-pay | Admitting: Pharmacist

## 2016-11-07 NOTE — Progress Notes (Signed)
Received fax from [employer] regarding narcotic drug utilization review program.  According the the document (see attached), prescription for oxycodone 10-325 mg written by Dr. Estanislado Pandy was filled on 09/16/16.  Reviewed patient's chart and noted this is not one of Dr. Arlean Hopping patient.  Patient is seen by [name] who also practices at [facility].  Discussed with Dr. Estanislado Pandy.  Spoke to the pharmacy and received a copy of the prescription.  Note prescription was written by [name].  I informed the pharmacist, Jan, of this information.    Elisabeth Most, Pharm.D., BCPS, CPP Clinical Pharmacist Pager: [phone] Phone: [phone] 11/07/2016 2:50 PM

## 2016-11-28 ENCOUNTER — Telehealth (INDEPENDENT_AMBULATORY_CARE_PROVIDER_SITE_OTHER): Payer: Self-pay | Admitting: Orthopedic Surgery

## 2016-11-28 DIAGNOSIS — M542 Cervicalgia: Secondary | ICD-10-CM

## 2016-11-28 NOTE — Telephone Encounter (Signed)
Please advise 

## 2016-11-28 NOTE — Telephone Encounter (Signed)
Patient called advised the Pennsaid is not working and asked if he can get something stronger for the pain. Patient said he can not lift his arm up or straighten it out all the way. The number to contact patient is [phone]

## 2016-11-30 ENCOUNTER — Other Ambulatory Visit: Payer: Self-pay | Admitting: Oncology

## 2016-12-02 MED ORDER — OXYCODONE-ACETAMINOPHEN 10-325 MG PO TABS: ORAL_TABLET | ORAL | 0 refills | Status: DC

## 2016-12-02 NOTE — Telephone Encounter (Signed)
I entered referral for patient to have repeat cervical injection. Advised patient not to d/c blood thinner until contacted by Dr Kennon Portela assistant.

## 2016-12-02 NOTE — Telephone Encounter (Signed)
Okay to refer to Dr. Ernestina Patches but he is going to have to work out the details on the blood thinner

## 2016-12-02 NOTE — Telephone Encounter (Signed)
Patient also requesting repeat injection in his neck with Dr Ernestina Patches. Ok to refer? Patient on blood thinners.   Spoke with Dr Marlou Sa. He ok'd rx.  Patient advised could pick up at front desk.

## 2016-12-02 NOTE — Addendum Note (Signed)
Addended byLaurann Montana on: 12/02/2016 03:42 PM   Modules accepted: Orders

## 2016-12-02 NOTE — Telephone Encounter (Signed)
Is he on and other pain meds from pain management for problem.

## 2016-12-02 NOTE — Addendum Note (Signed)
Addended byLaurann Montana on: 12/02/2016 02:24 PM   Modules accepted: Orders

## 2016-12-30 ENCOUNTER — Other Ambulatory Visit: Payer: Self-pay | Admitting: Oncology

## 2017-01-21 ENCOUNTER — Telehealth (INDEPENDENT_AMBULATORY_CARE_PROVIDER_SITE_OTHER): Payer: Self-pay

## 2017-01-21 ENCOUNTER — Telehealth (INDEPENDENT_AMBULATORY_CARE_PROVIDER_SITE_OTHER): Payer: Self-pay | Admitting: Orthopedic Surgery

## 2017-01-21 NOTE — Telephone Encounter (Signed)
-----   Message from [name] [name], RT sent at 01/21/2017 12:14 PM EDT ----- Patient calling states he is wanting to have injection. I see where previously spoke to wife and patient was out of town and he was to call to schedule. Please advise. Thanks.

## 2017-01-21 NOTE — Telephone Encounter (Signed)
Patient called asked if he can get some more samples of the cream he was given for his shoulders. Patient said he will pick up the samples.  Patient also asked if he can get an injection in his neck. The number to contact patient is [phone]

## 2017-01-21 NOTE — Telephone Encounter (Signed)
Called pt and scheduled him for consult on 01/28/17

## 2017-01-22 NOTE — Telephone Encounter (Signed)
IC s/w patient and advised could pick up samples from front desk and [facility] assistant would be calling about injection. I have sent them note.

## 2017-01-27 ENCOUNTER — Encounter (HOSPITAL_COMMUNITY): Payer: Self-pay | Admitting: Emergency Medicine

## 2017-01-27 ENCOUNTER — Emergency Department (HOSPITAL_COMMUNITY)
Admission: EM | Admit: 2017-01-27 | Discharge: 2017-01-27 | Disposition: A | Discharge status: Home / Self Care | Payer: Medicare Other | Attending: Emergency Medicine | Admitting: Emergency Medicine

## 2017-01-27 ENCOUNTER — Emergency Department (HOSPITAL_COMMUNITY): Payer: Medicare Other

## 2017-01-27 DIAGNOSIS — F17210 Nicotine dependence, cigarettes, uncomplicated: Secondary | ICD-10-CM | POA: Diagnosis not present

## 2017-01-27 DIAGNOSIS — I10 Essential (primary) hypertension: Secondary | ICD-10-CM | POA: Insufficient documentation

## 2017-01-27 DIAGNOSIS — Z7901 Long term (current) use of anticoagulants: Secondary | ICD-10-CM | POA: Insufficient documentation

## 2017-01-27 DIAGNOSIS — R05 Cough: Secondary | ICD-10-CM | POA: Diagnosis present

## 2017-01-27 DIAGNOSIS — Z79899 Other long term (current) drug therapy: Secondary | ICD-10-CM | POA: Insufficient documentation

## 2017-01-27 DIAGNOSIS — J441 Chronic obstructive pulmonary disease with (acute) exacerbation: Secondary | ICD-10-CM

## 2017-01-27 MED ORDER — PREDNISONE 20 MG PO TABS
60.0000 mg | ORAL_TABLET | Freq: Once | ORAL | Status: AC
  Administered 2017-01-27: 60 mg via ORAL
  Filled 2017-01-27: qty 3

## 2017-01-27 MED ORDER — DOXYCYCLINE HYCLATE 100 MG PO CAPS: 100.0000 mg | ORAL_CAPSULE | Freq: Two times a day (BID) | ORAL | 0 refills | Status: DC

## 2017-01-27 MED ORDER — DOXYCYCLINE HYCLATE 100 MG PO TABS
100.0000 mg | ORAL_TABLET | Freq: Once | ORAL | Status: AC
  Administered 2017-01-27: 100 mg via ORAL
  Filled 2017-01-27: qty 1

## 2017-01-27 MED ORDER — ALBUTEROL (5 MG/ML) CONTINUOUS INHALATION SOLN
10.0000 mg/h | INHALATION_SOLUTION | Freq: Once | RESPIRATORY_TRACT | Status: AC
  Administered 2017-01-27: 10 mg/h via RESPIRATORY_TRACT
  Filled 2017-01-27: qty 20

## 2017-01-27 MED ORDER — ALBUTEROL SULFATE HFA 108 (90 BASE) MCG/ACT IN AERS: 2.0000 | INHALATION_SPRAY | Freq: Four times a day (QID) | RESPIRATORY_TRACT | 2 refills | Status: DC | PRN

## 2017-01-27 MED ORDER — PREDNISONE 10 MG PO TABS: 60.0000 mg | ORAL_TABLET | Freq: Every day | ORAL | 0 refills | Status: DC

## 2017-01-27 MED ORDER — IPRATROPIUM BROMIDE 0.02 % IN SOLN
0.5000 mg | Freq: Once | RESPIRATORY_TRACT | Status: AC
  Administered 2017-01-27: 0.5 mg via RESPIRATORY_TRACT
  Filled 2017-01-27: qty 2.5

## 2017-01-27 NOTE — ED Notes (Signed)
Pt ambulated to room from waiting room, tolerated well. 

## 2017-01-27 NOTE — Discharge Instructions (Signed)
We saw you in the ER for your breathing related complains. We gave you some breathing treatments in the ER, and seems like your symptoms have improved. °Please take albuterol as needed every 4 hours. °Please take the medications prescribed. °Please refrain from smoking or smoke exposure. °Please see a primary care doctor in 1 week. °Return to the ER if your symptoms worsen. ° °

## 2017-01-27 NOTE — ED Provider Notes (Addendum)
[facility] DEPT Provider Note   CSN: [id number] Arrival date & time: 01/27/17  1314     History   Chief Complaint Chief Complaint  Patient presents with  . Cough    HPI [name] [name] is a 66 y.o. male.  HPI Pt with hx of DVT, HTN comes in with cc of dib. Pt reports that for the last 4-5 days he has been having dib and wheezing. He has not been diagnosed with COPD/ASthma. Pt has some congestion, productive cough with green phlegm -which is new for him and subjective fevers, malaise. No chest pain.   Past Medical History:  Diagnosis Date  . Anxiety and depression   . Back pain   . Diverticulitis   . Diverticulosis   . DVT (deep venous thrombosis) ([place])   . Gout   . Hernia   . History of blood clots   . Hypertension   . Nausea and vomiting   . Neuropathy, peripheral 05/15/2012  . Shingles     Patient Active Problem List   Diagnosis Date Noted  . Complete rotator cuff tear of left shoulder 10/21/2016  . Gout attack 04/02/2016  . Angina pectoris ([place]) 08/01/2014  . Hemorrhage of rectum and anus 03/28/2014  . Neuropathy, peripheral 05/15/2012  . Pulmonary embolism ([place]) 07/11/2011  . DVT (deep venous thrombosis) ([place]) 07/11/2011  . Abdominal pain, left lower quadrant 01/29/2011  . Long term current use of anticoagulant therapy 01/29/2011  . Diarrhea 01/29/2011    Past Surgical History:  Procedure Laterality Date  . APPENDECTOMY    . BACK SURGERY    . CHOLECYSTECTOMY  2008  . HERNIA REPAIR    . HIATAL HERNIA REPAIR    . LEFT HEART CATHETERIZATION WITH CORONARY ANGIOGRAM N/A 08/02/2014   Procedure: LEFT HEART CATHETERIZATION WITH CORONARY ANGIOGRAM;  Surgeon: Laverda Page, MD;  Location: [facility] CATH LAB;  Service: Cardiovascular;  Laterality: N/A;  . MULTIPLE EXTRACTIONS WITH ALVEOLOPLASTY N/A 10/19/2015   Procedure: Extraction of tooth #'s 2,12,13 with alveoloplasty;  Surgeon: Lenn Cal, DDS;  Location: [facility];  Service: Oral Surgery;  Laterality: N/A;   . SACRAL NERVE STIMULATOR PLACEMENT  2011       Home Medications    Prior to Admission medications   Medication Sig Start Date End Date Taking? Authorizing Provider  allopurinol (ZYLOPRIM) 300 MG tablet Take 300 mg by mouth daily.   Yes [provider]  colchicine 0.6 MG tablet Take 1 tablet (0.6 mg total) by mouth 2 (two) times daily. Take 2 tablets for first dose then 1 pill twice daily for gout attack 04/02/16 01/27/17 Yes [name], MD  cyclobenzaprine (FLEXERIL) 10 MG tablet Take 10 mg by mouth 3 (three) times daily. 02/15/16  Yes [provider]  diazepam (VALIUM) 5 MG tablet Take 5 mg by mouth 3 (three) times daily.   Yes [provider]  Diclofenac Sodium (PENNSAID) 2 % SOLN Place 2 Squirts onto the skin 2 (two) times daily. 10/21/16  Yes Meredith Pel, MD  DULoxetine (CYMBALTA) 60 MG capsule Take 120 mg by mouth daily.    Yes [provider]  metoprolol tartrate (LOPRESSOR) 25 MG tablet Take 25 mg by mouth 2 (two) times daily. 07/29/14  Yes [provider]  NIFEDICAL XL 60 MG 24 hr tablet Take 120 mg by mouth daily.  02/21/14  Yes [provider]  PROAIR HFA 108 (90 BASE) MCG/ACT inhaler Inhale 1 puff into the lungs every 6 (six) hours  as needed for wheezing.  03/23/14  Yes [provider]  tiZANidine (ZANAFLEX) 4 MG tablet Take 4 mg by mouth every 6 (six) hours as needed for muscle spasms.  07/27/14  Yes [provider]  valsartan-hydrochlorothiazide (DIOVAN-HCT) 320-25 MG per tablet Take 1 tablet by mouth daily.   Yes [provider]  XARELTO 20 MG TABS tablet take 1 tablet by mouth once daily WITH SUPPER. 09/27/16  Yes Granfortuna, Alyson Locket, MD  albuterol (PROVENTIL HFA;VENTOLIN HFA) 108 (90 Base) MCG/ACT inhaler Inhale 2 puffs into the lungs every 6 (six) hours as needed for wheezing or shortness of breath. 01/27/17   Varney Biles, MD  doxycycline (VIBRAMYCIN) 100 MG capsule Take 1 capsule  (100 mg total) by mouth 2 (two) times daily. 01/27/17   Varney Biles, MD  enoxaparin (LOVENOX) 120 MG/0.8ML injection Inject 0.8 mLs (120 mg total) into the skin every 12 (twelve) hours. Patient not taking: Reported on 01/27/2017 02/25/16   [name], MD  ondansetron (ZOFRAN) 4 MG tablet Take 1 tablet (4 mg total) by mouth every 8 (eight) hours as needed for nausea or vomiting. Patient not taking: Reported on 01/27/2017 03/17/15   [name], MD  predniSONE (DELTASONE) 10 MG tablet Take 6 tablets (60 mg total) by mouth daily. 01/27/17   Varney Biles, MD  VOLTAREN 1 % GEL Apply 2 g topically daily as needed. For neck and shoulder pain 02/23/14   [provider]    Family History Family History  Problem Relation Age of Onset  . Prostate cancer Father   . Heart disease Father   . Diabetes Mother   . Heart disease Mother   . Colon cancer Maternal Uncle        dx in his 68's  . Pancreatic cancer Paternal Uncle   . Prostate cancer Paternal Uncle   . Multiple sclerosis Daughter   . Prostate cancer Paternal Uncle     Social History Social History  Substance Use Topics  . Smoking status: Current Every Day Smoker    Packs/day: 0.50    Years: 40.00    Types: Cigarettes  . Smokeless tobacco: Never Used     Comment: sometimes less.  . Alcohol use 0.0 oz/week     Comment: Occasional     Allergies   Aspirin; Celecoxib; Ibuprofen; Lyrica [pregabalin]; and Vicodin [hydrocodone-acetaminophen]   Review of Systems Review of Systems  Constitutional: Negative for activity change and appetite change.  Respiratory: Positive for shortness of breath and wheezing. Negative for cough.   Cardiovascular: Negative for chest pain.  Gastrointestinal: Negative for abdominal pain.  Genitourinary: Negative for dysuria.     Physical Exam Updated Vital Signs BP (!) 157/84   Pulse (!) 103   Temp 99 F (37.2 C) (Oral)   Resp 18   SpO2 97%   Physical Exam  Constitutional: He  is oriented to person, place, and time. He appears well-developed.  HENT:  Head: Normocephalic and atraumatic.  Eyes: Conjunctivae and EOM are normal. Pupils are equal, round, and reactive to light.  Neck: Normal range of motion. Neck supple.  Cardiovascular: Normal rate and regular rhythm.   Pulmonary/Chest: Effort normal. He has wheezes. He has no rales.  Abdominal: Soft. Bowel sounds are normal. He exhibits no distension and no mass. There is no tenderness. There is no rebound and no guarding.  Musculoskeletal: He exhibits no deformity.  Lymphadenopathy:    He has cervical adenopathy.  Neurological: He is alert and oriented to person,  place, and time.  Skin: Skin is warm.  Nursing note and vitals reviewed.    ED Treatments / Results  Labs (all labs ordered are listed, but only abnormal results are displayed) Labs Reviewed - No data to display  EKG  EKG Interpretation None       Radiology Dg Chest 2 View  Result Date: 01/27/2017 CLINICAL DATA:  66 y/o M; cough, headache, and bilateral flank pain. EXAM: CHEST  2 VIEW COMPARISON:  08/04/2015 chest radiograph FINDINGS: Normal cardiac silhouette. Aortic atherosclerosis with calcification. Clear lungs. No pleural effusion or pneumothorax. Mild multilevel degenerative changes of the spine. Right shoulder rotator cuff repair. Spinal stimulator projecting over mid thoracic spine. IMPRESSION: No active cardiopulmonary disease. Electronically Signed   By: Kristine Garbe M.D.   On: 01/27/2017 14:38    Procedures Procedures (including critical care time)  Medications Ordered in ED Medications  albuterol (PROVENTIL,VENTOLIN) solution continuous neb (10 mg/hr Nebulization Given 01/27/17 1835)  ipratropium (ATROVENT) nebulizer solution 0.5 mg (0.5 mg Nebulization Given 01/27/17 1835)  predniSONE (DELTASONE) tablet 60 mg (60 mg Oral Given 01/27/17 1950)  doxycycline (VIBRA-TABS) tablet 100 mg (100 mg Oral Given 01/27/17 1950)      Initial Impression / Assessment and Plan / ED Course  I have reviewed the triage vital signs and the nursing notes.  Pertinent labs & imaging results that were available during my care of the patient were reviewed by me and considered in my medical decision making (see chart for details).  Clinical Course as of Jan 28 2104  Mon Jan 27, 2017  2058 Repeat exam reveals improvement of wheezing in all lung fields. Patient is not in any respiratory distress nor is there hypoxia. Strict ER return precautions have been discussed, and patient is agreeing with the plan and is comfortable with the workup done and the recommendations from the ER. Close PCP f/u advised.   [AN]    Clinical Course User Index [AN] Varney Biles, MD    Pt comes in with cc of dib. He is noted to have a URI like syndrome going on - viral, with some diffuse wheezing. Pt has a new cough, new phlegm. WE will start pt on nebs for the generalized wheezing. DDx is CAP, viral infection, COPD exacerbation, CHF. We dont think pt has PE given the predominance of infectious symptoms.  Smoking cessation instruction/counseling given:  counseled patient on the dangers of tobacco use, advised patient to stop smoking, and reviewed strategies to maximize success. Discussed 2-3 min.   Final Clinical Impressions(s) / ED Diagnoses   Final diagnoses:  Acute exacerbation of chronic obstructive pulmonary disease (COPD) (HCC)    New Prescriptions New Prescriptions   ALBUTEROL (PROVENTIL HFA;VENTOLIN HFA) 108 (90 BASE) MCG/ACT INHALER    Inhale 2 puffs into the lungs every 6 (six) hours as needed for wheezing or shortness of breath.   DOXYCYCLINE (VIBRAMYCIN) 100 MG CAPSULE    Take 1 capsule (100 mg total) by mouth 2 (two) times daily.   PREDNISONE (DELTASONE) 10 MG TABLET    Take 6 tablets (60 mg total) by mouth daily.     Varney Biles, MD 01/27/17 2105    Varney Biles, MD 01/27/17 2105

## 2017-01-27 NOTE — Progress Notes (Signed)
PT id by DOB MRN and name

## 2017-01-27 NOTE — ED Triage Notes (Signed)
Pt sts productive cough with yellow sputum and fever

## 2017-01-28 ENCOUNTER — Institutional Professional Consult (permissible substitution) (INDEPENDENT_AMBULATORY_CARE_PROVIDER_SITE_OTHER): Payer: Medicare Other | Admitting: Physical Medicine and Rehabilitation

## 2017-01-29 ENCOUNTER — Other Ambulatory Visit: Payer: Self-pay | Admitting: Oncology

## 2017-01-29 DIAGNOSIS — Z7901 Long term (current) use of anticoagulants: Secondary | ICD-10-CM

## 2017-01-29 DIAGNOSIS — I82401 Acute embolism and thrombosis of unspecified deep veins of right lower extremity: Secondary | ICD-10-CM

## 2017-02-28 ENCOUNTER — Other Ambulatory Visit: Payer: Self-pay | Admitting: Oncology

## 2017-03-03 NOTE — Telephone Encounter (Signed)
Call made to patient after receiving refill request from pharmacy for coumadin. Pt confirmed that he is taking xarelto and NOT coumadin.  Coumadin refill request was denied, pt states he has refills on his xarelto and is already scheduled to see DrGranfortuna on 07/09.  No further action needed. Phone call complete.Despina Hidden Cassady6/25/20189:54 AM

## 2017-03-17 ENCOUNTER — Encounter: Payer: Self-pay | Admitting: Oncology

## 2017-03-17 ENCOUNTER — Ambulatory Visit (INDEPENDENT_AMBULATORY_CARE_PROVIDER_SITE_OTHER): Payer: Medicare Other | Admitting: Oncology

## 2017-03-17 VITALS — BP 159/86 | HR 72 | Temp 98.2°F | Ht 76.0 in | Wt 275.6 lb

## 2017-03-17 DIAGNOSIS — G6289 Other specified polyneuropathies: Secondary | ICD-10-CM | POA: Diagnosis not present

## 2017-03-17 DIAGNOSIS — I2782 Chronic pulmonary embolism: Secondary | ICD-10-CM | POA: Diagnosis not present

## 2017-03-17 DIAGNOSIS — Z885 Allergy status to narcotic agent status: Secondary | ICD-10-CM | POA: Diagnosis not present

## 2017-03-17 DIAGNOSIS — I10 Essential (primary) hypertension: Secondary | ICD-10-CM | POA: Diagnosis not present

## 2017-03-17 DIAGNOSIS — I82502 Chronic embolism and thrombosis of unspecified deep veins of left lower extremity: Secondary | ICD-10-CM | POA: Diagnosis not present

## 2017-03-17 DIAGNOSIS — M549 Dorsalgia, unspecified: Secondary | ICD-10-CM

## 2017-03-17 DIAGNOSIS — Z886 Allergy status to analgesic agent status: Secondary | ICD-10-CM

## 2017-03-17 DIAGNOSIS — Z888 Allergy status to other drugs, medicaments and biological substances status: Secondary | ICD-10-CM

## 2017-03-17 DIAGNOSIS — Z8249 Family history of ischemic heart disease and other diseases of the circulatory system: Secondary | ICD-10-CM

## 2017-03-17 DIAGNOSIS — M109 Gout, unspecified: Secondary | ICD-10-CM

## 2017-03-17 DIAGNOSIS — Z7901 Long term (current) use of anticoagulants: Secondary | ICD-10-CM | POA: Diagnosis not present

## 2017-03-17 DIAGNOSIS — J988 Other specified respiratory disorders: Secondary | ICD-10-CM | POA: Diagnosis not present

## 2017-03-17 DIAGNOSIS — F17210 Nicotine dependence, cigarettes, uncomplicated: Secondary | ICD-10-CM

## 2017-03-17 DIAGNOSIS — G8929 Other chronic pain: Secondary | ICD-10-CM

## 2017-03-17 NOTE — Patient Instructions (Addendum)
To lab today Return visit 6 months 

## 2017-03-17 NOTE — Progress Notes (Signed)
Hematology and Oncology Follow Up Visit  [name] [name] [id number] Mar 11, 1951 66 y.o. 03/17/2017 3:59 PM   Principle Diagnosis: Encounter Diagnoses  Name Primary?  . Chronic deep vein thrombosis (DVT) of left lower extremity, unspecified vein (HCC) Yes  . Other chronic pulmonary embolism without acute cor pulmonale ([place])   . Long term current use of anticoagulant therapy   Clinical summary: 66 year old man referred in August of 2011 for advice on long-term anticoagulation. He has a complex clotting history. He had bilateral DVTs and bilateral pulmonary emboli in 1990. Not documented in our hospital records. 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 He had an additional right lower extremity DVT in 2010 about one month after a cholecystectomy. He developed a right lower extremity superficial thrombosis when he was off Coumadin just seven days back in July of 2012 in anticipation of hernia surgery. A hypercoagulation profile revealed none of the common abnormalities associated with clotting. However, in view of his clinical history, I advised long-term Coumadin anticoagulation. We have been managing his anticoagulation in our office. 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 He has had wide fluctuations in his INR levels. He has had multiple visits to the emergency department for acute flareup of right calf pain. Venous Doppler study done on 02/25/2016 showed a new, acute, DVT in the right posterior tibial veins. Chronic thrombus noted in the right common femoral vein. Superficial thrombosis noted in varicosities in the greater saphenous vein at the level of the mid right calf. This event occurred at a time when his Coumadin was subtherapeutic. He was bridged with Lovenox and his Coumadin dose was adjusted. He had another study done on 06/03/2016 to evaluate recurrent right calf pain. No new acute abnormalities noted. Chronic  appearing changes noted in the left femoral vein and left posterior tibial veins. Chronic changes on the right. Due to wide fluctuations in his INRs, I elected to stop Coumadin and start him on Xarelto beginning on 06/21/2016.  He has chronic spine problems. He has a implanted TENS unit. He developed progressive peripheral neuropathy. He has burning dysesthesias of his feet radiating up his legs. At times he can't feel his feet on the gas pedal. He has been tried on a number of different medications including Lyrica and Neurontin but doesn't tolerate these medications do to what he believes is significant swelling when he takes them. He has been evaluated by [facility] from neurology. One doctor told him he thought he had gout and put him on allopurinol which also cause swelling and he stopped it. His only joint symptom is in a right shoulder joint where he had previous rotator cuff surgery and he was subsequently told he has bony spurs.  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  Interim History:  He is tolerating the Xarelto well and has had no further episodes of thrombosis.  He still gets some chronic swelling of his right lower extremity. He had a recent severe bronchitis about 1 month ago requiring a doctor visit and a emergency room visit.  Symptoms have now resolved. He denies any dyspnea, chest pain, or tightness at this time. He had left rotator cuff shoulder surgery back in February.  He feels his pain is actually worse since the surgery.  Surgeon recommends ongoing physical therapy and a  trial of the steroid injection. He has a new job as a Optician, dispensing and has driven as far as [place].  He states he is taking his Xarelto as prescribed.  Medications: reviewed  Allergies:  Allergies  Allergen Reactions  . Aspirin  Other (See Comments)    ulcers  . Celecoxib Other (See Comments)    Stomach irritation  . Ibuprofen Other (See Comments)    ulcers  . Lyrica [Pregabalin] Swelling  . Vicodin [Hydrocodone-Acetaminophen]     Makes me mean     Review of Systems: See interim history Remaining ROS negative:   Physical Exam: Blood pressure (!) 159/86, pulse 72, temperature 98.2 F (36.8 C), temperature source Oral, height 6\' 4"  (1.93 m), weight 275 lb 9.6 oz (125 kg), SpO2 99 %. Wt Readings from Last 3 Encounters:  03/17/17 275 lb 9.6 oz (125 kg)  06/24/16 259 lb 8 oz (117.7 kg)  06/17/16 252 lb 9.6 oz (114.6 kg)     General appearance: Tall well-nourished African-American man [name]: Pharynx no erythema, exudate, mass, or ulcer. No thyromegaly or thyroid nodules Lymph nodes: No cervical, supraclavicular, or axillary lymphadenopathy Breasts: Lungs: Clear to auscultation, resonant to percussion throughout Heart: Regular rhythm, no murmur, no gallop, no rub, no click, no edema Abdomen: Soft, nontender, normal bowel sounds, no mass, no organomegaly Extremities: No edema, no calf tenderness Musculoskeletal: no joint deformities calf measurements: 39 cm on the left, 43 cm on the right GU:  Vascular: Carotid pulses 2+, no bruits,  Neurologic: Alert, oriented, PERRLA, left optic disc right not visualized sharp and vessels normal, no hemorrhage or exudate, Cranial nerves grossly normal, motor strength 5 over 5, reflexes 1+ symmetric at the biceps, absent symmetric at the knees, upper body coordination normal, gait normal, Skin: No rash or ecchymosis  Lab Results: CBC W/Diff    Component Value Date/Time   WBC 4.7 06/24/2016 1021   WBC 4.5 04/02/2016 1004   RBC 5.92 (H) 06/24/2016 1021   RBC 5.76 04/02/2016 1004   HGB 13.7 06/24/2016 1021   HGB 13.1 04/04/2014 1110   HCT 43.0 06/24/2016 1021   HCT 41.7 04/04/2014 1110   PLT 273 06/24/2016 1021   MCV 73 (L) 06/24/2016 1021   MCV 72.4 (L) 04/04/2014  1110   MCH 23.1 (L) 06/24/2016 1021   MCH 22.9 (L) 04/02/2016 1004   MCHC 31.9 06/24/2016 1021   MCHC 32.6 04/02/2016 1004   RDW 15.6 (H) 06/24/2016 1021   RDW 16.5 (H) 04/04/2014 1110   LYMPHSABS 1.4 06/24/2016 1021   LYMPHSABS 1.5 04/04/2014 1110   MONOABS 0.5 04/02/2016 1004   MONOABS 0.5 04/04/2014 1110   EOSABS 0.0 06/24/2016 1021   BASOSABS 0.0 06/24/2016 1021   BASOSABS 0.1 04/04/2014 1110     Chemistry      Component Value Date/Time   NA 142 05/10/2016 0951   NA 143 04/04/2014 1116   K 3.9 05/10/2016 0951   K 3.7 04/04/2014 1116   CL 98 05/10/2016 0951   CO2 24 05/10/2016 0951   CO2 30 (H) 04/04/2014 1116   BUN 13 05/10/2016 0951   BUN 10.1 04/04/2014 1116   CREATININE 0.91 05/10/2016 0951   CREATININE 1.0 04/04/2014 1116      Component Value Date/Time   CALCIUM 10.0 05/10/2016 0951   CALCIUM 9.3 04/04/2014 1116   ALKPHOS 61 05/10/2016 0951   ALKPHOS 45 04/04/2014 1116   AST 16 05/10/2016 0951   AST 14 04/04/2014 1116   ALT 15  05/10/2016 0951   ALT 10 04/04/2014 1116   BILITOT 0.3 05/10/2016 0951   BILITOT 0.48 04/04/2014 1116       Radiological Studies: No results found.  Impression:  #1. 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 Status post placement of a caval filter. He is on chronic anticoagulation  changed in October 2017 from warfarin to Xarelto.  #2. Chronic lower extremity pain right greater than left secondary to a postphlebitic syndrome  #3. Chronic left pectoral pain secondary to postherpetic neuralgia  #4. Chronic back pain status post placement of a TENS stimulator.  #5. Chronic neuropathy of the lower extremities related to #4.  #6. Essential hypertension  #7. Obstructive airway disease  #8. Gout  #9.  Status post left rotator cuff repair February 2018 with persistent left shoulder pain  CC: Patient Care Team: Elwyn Reach, MD as PCP -  General (Internal Medicine)   Murriel Hopper, MD, [place]  Hematology-Oncology/Internal Medicine     7/9/20183:59 PM

## 2017-03-18 LAB — COMPREHENSIVE METABOLIC PANEL
ALT: 14 IU/L (ref 0–44)
AST: 19 IU/L (ref 0–40)
Albumin/Globulin Ratio: 1.9 (ref 1.2–2.2)
Albumin: 4.1 g/dL (ref 3.6–4.8)
Alkaline Phosphatase: 50 IU/L (ref 39–117)
BUN/Creatinine Ratio: 11 (ref 10–24)
BUN: 11 mg/dL (ref 8–27)
Bilirubin Total: 0.3 mg/dL (ref 0.0–1.2)
CO2: 28 mmol/L (ref 20–29)
Calcium: 9.3 mg/dL (ref 8.6–10.2)
Chloride: 103 mmol/L (ref 96–106)
Creatinine, Ser: 0.97 mg/dL (ref 0.76–1.27)
GFR calc Af Amer: 94 mL/min/{1.73_m2} (ref >59)
GFR calc non Af Amer: 81 mL/min/{1.73_m2} (ref >59)
Globulin, Total: 2.2 g/dL (ref 1.5–4.5)
Glucose: 88 mg/dL (ref 65–99)
Potassium: 4.1 mmol/L (ref 3.5–5.2)
Sodium: 146 mmol/L — ABNORMAL HIGH (ref 134–144)
Total Protein: 6.3 g/dL (ref 6.0–8.5)

## 2017-03-18 LAB — CBC WITH DIFFERENTIAL/PLATELET
Basophils Absolute: 0 10*3/uL (ref 0.0–0.2)
Basos: 1 %
EOS (ABSOLUTE): 0.1 10*3/uL (ref 0.0–0.4)
Eos: 3 %
Hematocrit: 38.6 % (ref 37.5–51.0)
Hemoglobin: 12.2 g/dL — ABNORMAL LOW (ref 13.0–17.7)
Immature Grans (Abs): 0 10*3/uL (ref 0.0–0.1)
Immature Granulocytes: 0 %
Lymphocytes Absolute: 1.5 10*3/uL (ref 0.7–3.1)
Lymphs: 37 %
MCH: 22.8 pg — ABNORMAL LOW (ref 26.6–33.0)
MCHC: 31.6 g/dL (ref 31.5–35.7)
MCV: 72 fL — ABNORMAL LOW (ref 79–97)
Monocytes Absolute: 0.4 10*3/uL (ref 0.1–0.9)
Monocytes: 10 %
Neutrophils Absolute: 2 10*3/uL (ref 1.4–7.0)
Neutrophils: 49 %
Platelets: 279 10*3/uL (ref 150–379)
RBC: 5.36 x10E6/uL (ref 4.14–5.80)
RDW: 16.6 % — ABNORMAL HIGH (ref 12.3–15.4)
WBC: 4.1 10*3/uL (ref 3.4–10.8)

## 2017-03-19 ENCOUNTER — Other Ambulatory Visit: Payer: Self-pay | Admitting: Oncology

## 2017-03-19 DIAGNOSIS — D508 Other iron deficiency anemias: Secondary | ICD-10-CM

## 2017-03-20 ENCOUNTER — Telehealth: Payer: Self-pay | Admitting: *Deleted

## 2017-03-20 NOTE — Telephone Encounter (Signed)
Pt called / informed "lab overall good; starting to get anemic again. He should go back on one a day iron pills - can get OTC. Come in for lab check in 3 months" per Dr Beryle Beams. Pt voiced understanding. Lab appt scheduled Oct 9th @ 1045 AM.

## 2017-03-20 NOTE — Telephone Encounter (Signed)
-----   Message from [name], MD sent at 03/19/2017  9:13 AM EDT ----- Call pt: lab overall good. He is starting to get anemic again. He should go back on one a day iron pills - can get OTC. Come in for lab check in 3 months

## 2017-03-30 ENCOUNTER — Other Ambulatory Visit: Payer: Self-pay | Admitting: Oncology

## 2017-04-01 NOTE — Telephone Encounter (Signed)
He is on Xarelto so no warfarin refill indicated

## 2017-04-29 ENCOUNTER — Other Ambulatory Visit: Payer: Self-pay | Admitting: Oncology

## 2017-05-14 ENCOUNTER — Ambulatory Visit (INDEPENDENT_AMBULATORY_CARE_PROVIDER_SITE_OTHER): Payer: Self-pay

## 2017-05-14 ENCOUNTER — Ambulatory Visit (INDEPENDENT_AMBULATORY_CARE_PROVIDER_SITE_OTHER): Payer: Medicare Other | Admitting: Orthopedic Surgery

## 2017-05-14 ENCOUNTER — Encounter (INDEPENDENT_AMBULATORY_CARE_PROVIDER_SITE_OTHER): Payer: Self-pay | Admitting: Orthopedic Surgery

## 2017-05-14 DIAGNOSIS — M7552 Bursitis of left shoulder: Secondary | ICD-10-CM

## 2017-05-14 DIAGNOSIS — M25512 Pain in left shoulder: Secondary | ICD-10-CM

## 2017-05-14 DIAGNOSIS — G8929 Other chronic pain: Secondary | ICD-10-CM

## 2017-05-14 MED ORDER — LIDOCAINE HCL 1 % IJ SOLN
5.0000 mL | INTRAMUSCULAR | Status: AC | PRN
  Administered 2017-05-14: 5 mL

## 2017-05-14 MED ORDER — BUPIVACAINE HCL 0.5 % IJ SOLN
9.0000 mL | INTRAMUSCULAR | Status: AC | PRN
  Administered 2017-05-14: 9 mL via INTRA_ARTICULAR

## 2017-05-14 MED ORDER — METHYLPREDNISOLONE ACETATE 40 MG/ML IJ SUSP
40.0000 mg | INTRAMUSCULAR | Status: AC | PRN
  Administered 2017-05-14: 40 mg via INTRA_ARTICULAR

## 2017-05-14 NOTE — Progress Notes (Signed)
Office Visit Note   Patient: [name] [name]           Date of Birth: 05-Nov-1950           MRN: [id number] Visit Date: 05/14/2017 Requested by: [name] Reach, MD [place], [place] [id number] PCP: [name] Reach, MD  Subjective: Chief Complaint  Patient presents with  . Left Shoulder - Follow-up    HPI: [name] [name] is a 66 year old patient with left shoulder pain.  He has os acromiale and underwent distal clavicle excision earlier this year.  He's haome continued arm shoulder and scapular pain.  He is still working.  He is not taking any pain medicine.  He is left-hand dominant.  He works as a Geophysicist/field seismologist.  Takes Tylenol for pain.  He has tried different creams.  He states that compression sleeve on his forearm helps the pain affecting his whole arm. When I saw him last clinic visit I didn't think there was anything more to do shoulder wise to improve his symptoms.  He does have a long history of significant neck problems.              ROS: All systems reviewed are negative as they relate to the chief complaint within the history of present illness.  Patient denies  fevers or chills.   Assessment & Plan: Visit Diagnoses:  1. Chronic left shoulder pain     Plan: impression is left shoulder pain which may or may not be related to a component of bursitis present.  His os acromiale may be symptomatic butnot too much to do about that at this time.  Distal clavicle excision looks good on plain radiographs.  Plan is injection today in the subacromial se.  He does with that.  Follow-up with   Follow-Up Instructions: Return if symptoms worsen or fail to improve.   Orders:  Orders Placed This Encounter  Procedures  . XR Shoulder Left   No orders of the defined types were placed in this encounter.     Procedures: Large Joint Inj Date/Time: 05/14/2017 11:46 AM Performed by: [name] [name] Authorized by: [name] [name]   Consent Given by:  Patient Site marked: the  procedure site was marked   Timeout: prior to procedure the correct patient, procedure, and site was verified   Indications:  Pain and diagnostic evaluation Location:  Shoulder Site:  L subacromial bursa Prep: patient was prepped and draped in usual sterile fashion   Needle Size:  18 G Needle Length:  1.5 inches Approach:  Posterior Ultrasound Guidance: No   Fluoroscopic Guidance: No   Arthrogram: No   Medications:  5 mL lidocaine 1 %; 9 mL bupivacaine 0.5 %; 40 mg methylPREDNISolone acetate 40 MG/ML Aspiration Attempted: No   Patient tolerance:  Patient tolerated the procedure well with no immediate complications     Clinical Data: No additional findings.  Objective: Vital Signs: There were no vitals taken for this visit.  Physical Exam:   Constitutional: Patient appears well-developed HEENT:  Head: Normocephalic Eyes:EOM are normal Neck: Normal range of motion Cardiovascular: Normal rate Pulmonary/chest: Effort normal Neurologic: Patient is alert Skin: Skin is warm Psychiatric: Patient has normal mood and affect    Orthopedic exam demonstrates reasonable cervical spine range of motion.  Scapular dyskinesia not present.  Rotator cuff strength intact on the left.  No tenderness to palpation of the acromioclavicular joint.  Mild crepitus with internal and external rotation of the left arm.  Radial pulse  intact    ecialty Comments:  No specialty comments available.  Imaging: Xr Shoulder Left  Result Date: 05/14/2017 AP outlet and axillary left shoulder reviewed.  Distal clavicle resection has been performed.  Some spurring of the acromion is noted.  No glenohumeral arthritis present.  Visualized lung fields clear.  No other rotator cuff calcifications noted    PMFS History: Patient Active Problem List   Diagnosis Date Noted  . Complete rotator cuff tear of left shoulder 10/21/2016  . Gout attack 04/02/2016  . Angina pectoris ([place]) 08/01/2014  . Hemorrhage of  rectum and anus 03/28/2014  . Neuropathy, peripheral 05/15/2012  . Pulmonary embolism ([place]) 07/11/2011  . DVT (deep venous thrombosis) ([place]) 07/11/2011  . Abdominal pain, left lower quadrant 01/29/2011  . Long term current use of anticoagulant therapy 01/29/2011  . Diarrhea 01/29/2011   Past Medical History:  Diagnosis Date  . Anxiety and depression   . Back pain   . Diverticulitis   . Diverticulosis   . DVT (deep venous thrombosis) ([place])   . Gout   . Hernia   . History of blood clots   . Hypertension   . Nausea and vomiting   . Neuropathy, peripheral 05/15/2012  . Shingles     Family History  Problem Relation Age of Onset  . Prostate cancer Father   . Heart disease Father   . Diabetes Mother   . Heart disease Mother   . Colon cancer Maternal Uncle        dx in his 27's  . Pancreatic cancer Paternal Uncle   . Prostate cancer Paternal Uncle   . Multiple sclerosis Daughter   . Prostate cancer Paternal Uncle     Past Surgical History:  Procedure Laterality Date  . APPENDECTOMY    . BACK SURGERY    . CHOLECYSTECTOMY  2008  . HERNIA REPAIR    . HIATAL HERNIA REPAIR    . LEFT HEART CATHETERIZATION WITH CORONARY ANGIOGRAM N/A 08/02/2014   Procedure: LEFT HEART CATHETERIZATION WITH CORONARY ANGIOGRAM;  Surgeon: Laverda Page, MD;  Location: [facility] C[facility] LAB;  Service: Cardiovascular;  Laterality: N/A;  . MULTIPLE EXTRACTIONS WITH ALVEOLOPLASTY N/A 10/19/2015   Procedure: Extraction of tooth #'s 2,12,13 with alveoloplasty;  Surgeon: Lenn Cal, DDS;  Location: [place];  Service: Oral Surgery;  Laterality: N/A;  . SACRAL NERVE STIMULATOR PLACEMENT  2011   Social History   Occupational History  . unemployed Other  .  Retired   Social History Main Topics  . Smoking status: Current Every Day Smoker    Packs/day: 0.50    Years: 40.00    Types: Cigarettes  . Smokeless tobacco: Never Used     Comment: sometimes less.  . Alcohol use 0.0 oz/week     Comment: Occasional    . Drug use: No  . Sexual activity: Not on file

## 2017-05-29 ENCOUNTER — Other Ambulatory Visit: Payer: Self-pay | Admitting: Oncology

## 2017-06-17 ENCOUNTER — Telehealth: Payer: Self-pay | Admitting: *Deleted

## 2017-06-17 ENCOUNTER — Encounter (INDEPENDENT_AMBULATORY_CARE_PROVIDER_SITE_OTHER): Payer: Self-pay

## 2017-06-17 ENCOUNTER — Other Ambulatory Visit: Payer: Self-pay | Admitting: Oncology

## 2017-06-17 ENCOUNTER — Other Ambulatory Visit (INDEPENDENT_AMBULATORY_CARE_PROVIDER_SITE_OTHER): Payer: Medicare Other

## 2017-06-17 DIAGNOSIS — D508 Other iron deficiency anemias: Secondary | ICD-10-CM

## 2017-06-17 DIAGNOSIS — I82503 Chronic embolism and thrombosis of unspecified deep veins of lower extremity, bilateral: Secondary | ICD-10-CM

## 2017-06-17 DIAGNOSIS — I87009 Postthrombotic syndrome without complications of unspecified extremity: Secondary | ICD-10-CM

## 2017-06-17 NOTE — Telephone Encounter (Signed)
Called / informed pt of vascular surgery referral to eval varicose veins per Dr Beryle Beams. Informed pt he will receive a call about an appt but call back if he does not hear anything in a week. Voiced ok. Stated he saw Dr Jonelle Sidle - he did not say anything about swelling of leg; but order med for gout.

## 2017-06-17 NOTE — Telephone Encounter (Signed)
Pt's here for labs.States having problems with his right leg x 2 weeks. Right leg swollen, he's wearing compression stocking. Wants to know what to do about varicose veins; states stocking makes it worse. He saw his PCP, Dr Jonelle Sidle who ordered a type of x-ray last Tuesday. He's going back by his office this am for results. States sometimes right knee throbs. Took 3 ES Tylenol pills for the pain which did not help. Telephone# [phone]. Thanks

## 2017-06-17 NOTE — Telephone Encounter (Signed)
CAll pt: I will make referral to vascular surgery to evaluate varicose veins - he may be a candidate for laser surgery

## 2017-06-18 ENCOUNTER — Other Ambulatory Visit: Payer: Self-pay

## 2017-06-18 ENCOUNTER — Encounter: Payer: Self-pay | Admitting: Vascular Surgery

## 2017-06-18 DIAGNOSIS — M79605 Pain in left leg: Principal | ICD-10-CM

## 2017-06-18 DIAGNOSIS — M79604 Pain in right leg: Secondary | ICD-10-CM

## 2017-06-18 LAB — CBC WITH DIFFERENTIAL/PLATELET
Basophils Absolute: 0 10*3/uL (ref 0.0–0.2)
Basos: 1 %
EOS (ABSOLUTE): 0.1 10*3/uL (ref 0.0–0.4)
Eos: 1 %
Hematocrit: 43.3 % (ref 37.5–51.0)
Hemoglobin: 14.2 g/dL (ref 13.0–17.7)
Immature Grans (Abs): 0 10*3/uL (ref 0.0–0.1)
Immature Granulocytes: 0 %
Lymphocytes Absolute: 1.6 10*3/uL (ref 0.7–3.1)
Lymphs: 37 %
MCH: 23.9 pg — ABNORMAL LOW (ref 26.6–33.0)
MCHC: 32.8 g/dL (ref 31.5–35.7)
MCV: 73 fL — ABNORMAL LOW (ref 79–97)
Monocytes Absolute: 0.4 10*3/uL (ref 0.1–0.9)
Monocytes: 8 %
Neutrophils Absolute: 2.3 10*3/uL (ref 1.4–7.0)
Neutrophils: 53 %
Platelets: 254 10*3/uL (ref 150–379)
RBC: 5.94 x10E6/uL — ABNORMAL HIGH (ref 4.14–5.80)
RDW: 15.7 % — ABNORMAL HIGH (ref 12.3–15.4)
WBC: 4.3 10*3/uL (ref 3.4–10.8)

## 2017-06-18 LAB — FERRITIN: Ferritin: 56 ng/mL (ref 30–400)

## 2017-06-19 ENCOUNTER — Inpatient Hospital Stay (HOSPITAL_COMMUNITY): Admission: RE | Admit: 2017-06-19 | Payer: Self-pay | Source: Ambulatory Visit

## 2017-06-19 ENCOUNTER — Encounter: Payer: Medicare Other | Admitting: Vascular Surgery

## 2017-06-19 ENCOUNTER — Telehealth: Payer: Self-pay | Admitting: *Deleted

## 2017-06-19 NOTE — Telephone Encounter (Signed)
Noted thanks °

## 2017-06-19 NOTE — Telephone Encounter (Signed)
Pt called / informed "lab good - blood count back to normal." per Dr Beryle Beams, And awared of vascular referral.  Per [facility] -  pt has an appt today. Called pt back about appt - stated unware,did not receive a call. Given telephone #; he will call.

## 2017-06-19 NOTE — Telephone Encounter (Signed)
-----   Message from [name], MD sent at 06/18/2017  6:53 AM EDT ----- Call pt: lab good - blood count back to normal. I put in referral for vascular surg consult

## 2017-06-28 ENCOUNTER — Other Ambulatory Visit: Payer: Self-pay | Admitting: Oncology

## 2017-06-30 ENCOUNTER — Encounter (HOSPITAL_COMMUNITY): Payer: Self-pay

## 2017-06-30 ENCOUNTER — Emergency Department (HOSPITAL_COMMUNITY)
Admission: EM | Admit: 2017-06-30 | Discharge: 2017-06-30 | Disposition: A | Discharge status: Home / Self Care | Payer: Medicare Other | Attending: Emergency Medicine | Admitting: Emergency Medicine

## 2017-06-30 DIAGNOSIS — Z79899 Other long term (current) drug therapy: Secondary | ICD-10-CM | POA: Diagnosis not present

## 2017-06-30 DIAGNOSIS — Z7901 Long term (current) use of anticoagulants: Secondary | ICD-10-CM | POA: Diagnosis not present

## 2017-06-30 DIAGNOSIS — M25562 Pain in left knee: Secondary | ICD-10-CM | POA: Diagnosis present

## 2017-06-30 DIAGNOSIS — F329 Major depressive disorder, single episode, unspecified: Secondary | ICD-10-CM | POA: Diagnosis not present

## 2017-06-30 DIAGNOSIS — M25462 Effusion, left knee: Secondary | ICD-10-CM

## 2017-06-30 DIAGNOSIS — Z9049 Acquired absence of other specified parts of digestive tract: Secondary | ICD-10-CM | POA: Diagnosis not present

## 2017-06-30 DIAGNOSIS — F17210 Nicotine dependence, cigarettes, uncomplicated: Secondary | ICD-10-CM | POA: Insufficient documentation

## 2017-06-30 DIAGNOSIS — I10 Essential (primary) hypertension: Secondary | ICD-10-CM | POA: Insufficient documentation

## 2017-06-30 DIAGNOSIS — F419 Anxiety disorder, unspecified: Secondary | ICD-10-CM | POA: Insufficient documentation

## 2017-06-30 MED ORDER — OXYCODONE-ACETAMINOPHEN 5-325 MG PO TABS
1.0000 | ORAL_TABLET | Freq: Once | ORAL | Status: AC
  Administered 2017-06-30: 1 via ORAL
  Filled 2017-06-30: qty 1

## 2017-06-30 MED ORDER — LIDOCAINE-EPINEPHRINE (PF) 2 %-1:200000 IJ SOLN
10.0000 mL | Freq: Once | INTRAMUSCULAR | Status: AC
  Administered 2017-06-30: 10 mL
  Filled 2017-06-30: qty 20

## 2017-06-30 MED ORDER — OXYCODONE-ACETAMINOPHEN 5-325 MG PO TABS: 1.0000 | ORAL_TABLET | Freq: Four times a day (QID) | ORAL | 0 refills | Status: DC | PRN

## 2017-06-30 NOTE — ED Triage Notes (Signed)
Patient c/o right knee swelling and pain. Patient states started 2-3 weeks ago. Patient went to PCP and had an x-ray completed which showed arthritis. Patient also went to a vein specialist. Patient called his PCP today because he has had increased knee swelling. paient states he was told to come to the ED.

## 2017-06-30 NOTE — Discharge Instructions (Signed)
Ice and keep wrapped the rest of the today and then wrap daily when up and walking.  Use crutches as needed.

## 2017-06-30 NOTE — ED Provider Notes (Signed)
[facility] DEPT Provider Note   CSN: [id number] Arrival date & time: 06/30/17  1332     History   Chief Complaint Chief Complaint  Patient presents with  . Knee Pain  . Joint Swelling    HPI [name] [name] is a 66 y.o. male.  The history is provided by the patient and the spouse.  Knee Pain   This is a new problem. Episode onset: 3 weeks ago. The problem occurs constantly. The problem has been gradually worsening. The pain is present in the left knee. The quality of the pain is described as pounding and constant. The pain is at a severity of 10/10. The pain is severe. Associated symptoms include limited range of motion and stiffness. The symptoms are aggravated by activity and standing. He has tried rest, heat and OTC pain medications for the symptoms. The treatment provided no relief. There has been no history of extremity trauma. hx of arthritis    Past Medical History:  Diagnosis Date  . Anxiety and depression   . Back pain   . Diverticulitis   . Diverticulosis   . DVT (deep venous thrombosis) ([place])   . Gout   . Hernia   . History of blood clots   . Hypertension   . Nausea and vomiting   . Neuropathy, peripheral 05/15/2012  . Shingles     Patient Active Problem List   Diagnosis Date Noted  . Complete rotator cuff tear of left shoulder 10/21/2016  . Gout attack 04/02/2016  . Angina pectoris ([place]) 08/01/2014  . Hemorrhage of rectum and anus 03/28/2014  . Neuropathy, peripheral 05/15/2012  . Pulmonary embolism ([place]) 07/11/2011  . DVT (deep venous thrombosis) ([place]) 07/11/2011  . Abdominal pain, left lower quadrant 01/29/2011  . Long term current use of anticoagulant therapy 01/29/2011  . Diarrhea 01/29/2011    Past Surgical History:  Procedure Laterality Date  . APPENDECTOMY    . BACK SURGERY    . CHOLECYSTECTOMY  2008  . HERNIA REPAIR    . HIATAL HERNIA REPAIR    . LEFT HEART CATHETERIZATION WITH CORONARY ANGIOGRAM N/A  08/02/2014   Procedure: LEFT HEART CATHETERIZATION WITH CORONARY ANGIOGRAM;  Surgeon: Laverda Page, MD;  Location: [facility] CATH LAB;  Service: Cardiovascular;  Laterality: N/A;  . MULTIPLE EXTRACTIONS WITH ALVEOLOPLASTY N/A 10/19/2015   Procedure: Extraction of tooth #'s 2,12,13 with alveoloplasty;  Surgeon: Lenn Cal, DDS;  Location: [facility];  Service: Oral Surgery;  Laterality: N/A;  . SACRAL NERVE STIMULATOR PLACEMENT  2011       Home Medications    Prior to Admission medications   Medication Sig Start Date End Date Taking? Authorizing Provider  albuterol (PROVENTIL HFA;VENTOLIN HFA) 108 (90 Base) MCG/ACT inhaler Inhale 2 puffs into the lungs every 6 (six) hours as needed for wheezing or shortness of breath. 01/27/17  Yes Nanavati, Ankit, MD  colchicine 0.6 MG tablet Take 1 tablet (0.6 mg total) by mouth 2 (two) times daily. Take 2 tablets for first dose then 1 pill twice daily for gout attack 04/02/16 06/30/17 Yes [name], MD  cyclobenzaprine (FLEXERIL) 10 MG tablet Take 10 mg by mouth 3 (three) times daily as needed for muscle spasms.  02/15/16  Yes [provider]  Diclofenac Sodium (PENNSAID) 2 % SOLN Place 2 Squirts onto the skin 2 (two) times daily. 10/21/16  Yes Meredith Pel, MD  DULoxetine (CYMBALTA) 60 MG capsule Take 120 mg by mouth daily.  Yes [provider]  metoprolol tartrate (LOPRESSOR) 25 MG tablet Take 25 mg by mouth 2 (two) times daily. 07/29/14  Yes [provider]  NIFEDICAL XL 60 MG 24 hr tablet Take 120 mg by mouth daily.  02/21/14  Yes [provider]  valsartan-hydrochlorothiazide (DIOVAN-HCT) 320-25 MG per tablet Take 1 tablet by mouth daily.   Yes [provider]  VOLTAREN 1 % GEL Apply 2 g topically daily as needed (pain). For neck and shoulder pain 02/23/14  Yes [provider]  XARELTO 20 MG TABS tablet take 1 tablet by mouth once daily WITH SUPPER 01/30/17  Yes Granfortuna, Alyson Locket, MD    doxycycline (VIBRAMYCIN) 100 MG capsule Take 1 capsule (100 mg total) by mouth 2 (two) times daily. Patient not taking: Reported on 06/30/2017 01/27/17   Varney Biles, MD  enoxaparin (LOVENOX) 120 MG/0.8ML injection Inject 0.8 mLs (120 mg total) into the skin every 12 (twelve) hours. Patient not taking: Reported on 01/27/2017 02/25/16   [name], MD  ondansetron (ZOFRAN) 4 MG tablet Take 1 tablet (4 mg total) by mouth every 8 (eight) hours as needed for nausea or vomiting. Patient not taking: Reported on 01/27/2017 03/17/15   [name], MD  oxyCODONE-acetaminophen (PERCOCET/ROXICET) 5-325 MG tablet Take 1 tablet by mouth every 6 (six) hours as needed for severe pain. 06/30/17   Blanchie Dessert, MD  predniSONE (DELTASONE) 10 MG tablet Take 6 tablets (60 mg total) by mouth daily. Patient not taking: Reported on 06/30/2017 01/27/17   Varney Biles, MD  PROAIR HFA 108 (90 BASE) MCG/ACT inhaler Inhale 1 puff into the lungs every 6 (six) hours as needed for wheezing.  03/23/14   [provider]    Family History Family History  Problem Relation Age of Onset  . Prostate cancer Father   . Heart disease Father   . Diabetes Mother   . Heart disease Mother   . Colon cancer Maternal Uncle        dx in his 69's  . Pancreatic cancer Paternal Uncle   . Prostate cancer Paternal Uncle   . Multiple sclerosis Daughter   . Prostate cancer Paternal Uncle     Social History Social History  Substance Use Topics  . Smoking status: Current Every Day Smoker    Packs/day: 0.50    Years: 40.00    Types: Cigarettes  . Smokeless tobacco: Never Used     Comment: sometimes less.  . Alcohol use 0.0 oz/week     Comment: Occasional     Allergies   Aspirin; Celecoxib; Ibuprofen; Lyrica [pregabalin]; and Vicodin [hydrocodone-acetaminophen]   Review of Systems Review of Systems  Musculoskeletal: Positive for stiffness.  All other systems reviewed and are negative.    Physical  Exam Updated Vital Signs BP 132/87 (BP Location: Right Arm)   Pulse 77   Temp 98.5 F (36.9 C) (Oral)   Resp 18   Ht 6\' 4"  (1.93 m)   Wt 117.9 kg (260 lb)   SpO2 99%   BMI 31.65 kg/m   Physical Exam  Constitutional: He is oriented to person, place, and time. He appears well-developed and well-nourished. No distress.  HENT:  Head: Normocephalic and atraumatic.  Eyes: Pupils are equal, round, and reactive to light. EOM are normal.  Cardiovascular: Normal rate and intact distal pulses.   Pulmonary/Chest: Effort normal.  Musculoskeletal: He exhibits tenderness. He exhibits no edema.       Right knee: He exhibits decreased range of motion,  swelling and effusion. He exhibits no deformity and no erythema.       Legs: Neurological: He is alert and oriented to person, place, and time.  Skin: Skin is warm and dry. No rash noted. No erythema.  Psychiatric: He has a normal mood and affect. His behavior is normal.  Nursing note and vitals reviewed.    ED Treatments / Results  Labs (all labs ordered are listed, but only abnormal results are displayed) Labs Reviewed - No data to display  EKG  EKG Interpretation None       Radiology No results found.  Procedures .Joint Aspiration/Arthrocentesis Date/Time: 06/30/2017 3:50 PM Performed by: Blanchie Dessert Authorized by: Blanchie Dessert   Consent:    Consent obtained:  Verbal   Consent given by:  Patient   Risks discussed:  Bleeding, infection and pain   Alternatives discussed:  No treatment and referral Location:    Location:  Knee   Knee:  L knee Anesthesia (see MAR for exact dosages):    Anesthesia method:  Local infiltration   Local anesthetic:  Lidocaine 2% WITH epi Procedure details:    Preparation: Patient was prepped and draped in usual sterile fashion     Needle gauge:  18 G   Ultrasound guidance: no     Approach:  Lateral   Aspirate amount:  138mL   Aspirate characteristics:  Yellow and clear   Steroid  injected: no     Specimen collected: no   Post-procedure details:    Dressing:  Adhesive bandage   Patient tolerance of procedure:  Tolerated well, no immediate complications Comments:     Significant improvement in pt's pain   (including critical care time)  Medications Ordered in ED Medications  lidocaine-EPINEPHrine (XYLOCAINE W/EPI) 2 %-1:200000 (PF) injection 10 mL (10 mLs Infiltration Given 06/30/17 1442)  oxyCODONE-acetaminophen (PERCOCET/ROXICET) 5-325 MG per tablet 1 tablet (1 tablet Oral Given 06/30/17 1441)     Initial Impression / Assessment and Plan / ED Course  I have reviewed the triage vital signs and the nursing notes.  Pertinent labs & imaging results that were available during my care of the patient were reviewed by me and considered in my medical decision making (see chart for details).     Patient here with significant pain in his left knee that is worsened over the last 3 weeks.  Patient had imaging of the knee which showed arthritis but no other acute findings.   Low suspicion for septic joint or gout.  Patient does have varicose veins in his lower extremities and has seen a vascular and vein specialist for this.  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  He has had no new changes in his lower legs.  He had an ultrasound within the last few days that was negative for DVT.  His primary was going to set him up with follow-up with an orthopedist but his pain was so bad today he came here for further evaluation.  On exam patient appears to have a large knee effusion.  Joint aspiration performed with a yellow clear fluid but 110 mL's.  Patient felt significantly improved after removal of fluid.  Ace wrap was applied and ice.  Patient was given pain control PRN.  He will follow-up with orthopedics.  Final Clinical Impressions(s) / ED Diagnoses   Final diagnoses:  Effusion of left knee joint    New Prescriptions Discharge Medication List as of  06/30/2017  3:01 PM  START taking these medications   Details  oxyCODONE-acetaminophen (PERCOCET/ROXICET) 5-325 MG tablet Take 1 tablet by mouth every 6 (six) hours as needed for severe pain., Starting Mon 06/30/2017, Print         Blanchie Dessert, MD 06/30/17 [phone]

## 2017-07-08 ENCOUNTER — Ambulatory Visit (INDEPENDENT_AMBULATORY_CARE_PROVIDER_SITE_OTHER): Payer: Medicare Other

## 2017-07-08 ENCOUNTER — Encounter (INDEPENDENT_AMBULATORY_CARE_PROVIDER_SITE_OTHER): Payer: Self-pay | Admitting: Orthopaedic Surgery

## 2017-07-08 ENCOUNTER — Ambulatory Visit (INDEPENDENT_AMBULATORY_CARE_PROVIDER_SITE_OTHER): Payer: Medicare Other | Admitting: Orthopaedic Surgery

## 2017-07-08 DIAGNOSIS — M1711 Unilateral primary osteoarthritis, right knee: Secondary | ICD-10-CM | POA: Diagnosis not present

## 2017-07-08 MED ORDER — BUPIVACAINE HCL 0.5 % IJ SOLN
2.0000 mL | INTRAMUSCULAR | Status: AC | PRN
  Administered 2017-07-08: 2 mL via INTRA_ARTICULAR

## 2017-07-08 MED ORDER — METHYLPREDNISOLONE ACETATE 40 MG/ML IJ SUSP
40.0000 mg | INTRAMUSCULAR | Status: AC | PRN
  Administered 2017-07-08: 40 mg via INTRA_ARTICULAR

## 2017-07-08 MED ORDER — LIDOCAINE HCL 1 % IJ SOLN
2.0000 mL | INTRAMUSCULAR | Status: AC | PRN
  Administered 2017-07-08: 2 mL

## 2017-07-08 NOTE — Addendum Note (Signed)
Addended by: Precious Bard on: 07/08/2017 11:20 AM   Modules accepted: Orders

## 2017-07-08 NOTE — Progress Notes (Signed)
Office Visit Note   Patient: [name] [name]           Date of Birth: Mar 19, 1951           MRN: [id number] Visit Date: 07/08/2017              Requested by: Elwyn Reach, MD [place], [place] [address] PCP: Elwyn Reach, MD   Assessment & Plan: Visit Diagnoses:  1. Primary osteoarthritis of right knee     Plan: Impression is right knee end-stage arthritis with reactive effusion.  90 cc of effusion was aspirated and cortisone was then injected.  We did briefly discuss total knee replacement given his severe findings on x-ray.  Questions encouraged and answered.  Follow-up as needed.  Follow-Up Instructions: Return if symptoms worsen or fail to improve.   Orders:  Orders Placed This Encounter  Procedures  . XR KNEE 3 VIEW RIGHT   No orders of the defined types were placed in this encounter.     Procedures: Large Joint Inj Date/Time: 07/08/2017 9:24 AM Performed by: Leandrew Koyanagi Authorized by: Leandrew Koyanagi   Consent Given by:  Patient Timeout: prior to procedure the correct patient, procedure, and site was verified   Indications:  Pain Location:  Knee Site:  R knee Prep: patient was prepped and draped in usual sterile fashion   Needle Size:  22 G Approach:  Lateral Ultrasound Guidance: No   Fluoroscopic Guidance: No   Arthrogram: No   Medications:  2 mL bupivacaine 0.5 %; 40 mg methylPREDNISolone acetate 40 MG/ML; 2 mL lidocaine 1 % Aspirate amount (mL):  90 Patient tolerance:  Patient tolerated the procedure well with no immediate complications     Clinical Data: No additional findings.   Subjective: Chief Complaint  Patient presents with  . Right Knee - Pain    Patient is a 66 year old gentleman who comes in with right knee pain and effusion.  He presented to the ED a week ago and 110 cc of fluid was drained from the knee but the fluid was not sent to the lab nor was cortisone injected.  He comes in today for reaccumulation of  this fluid.  He has constant 10 out of 10 pain with difficulty sleeping and night pain.  He endorses numbness and tingling and burning pain.  The pain is worse with activity and walking.  He is ambulating with crutches.  Denies any injuries.    Review of Systems  Constitutional: Negative.   All other systems reviewed and are negative.    Objective: Vital Signs: There were no vitals taken for this visit.  Physical Exam  Constitutional: He is oriented to person, place, and time. He appears well-developed and well-nourished.  HENT:  Head: Normocephalic and atraumatic.  Eyes: Pupils are equal, round, and reactive to light.  Neck: Neck supple.  Pulmonary/Chest: Effort normal.  Abdominal: Soft.  Musculoskeletal: Normal range of motion.  Neurological: He is alert and oriented to person, place, and time.  Skin: Skin is warm.  Psychiatric: He has a normal mood and affect. His behavior is normal. Judgment and thought content normal.  Nursing note and vitals reviewed.   Ortho Exam Right knee exam shows a large effusion.  Collaterals and cruciates are stable. Specialty Comments:  No specialty comments available.  Imaging: Xr Knee 3 View Right  Result Date: 07/08/2017 End stage DJD    PMFS History: Patient Active Problem List   Diagnosis Date Noted  .  Primary osteoarthritis of right knee 07/08/2017  . Complete rotator cuff tear of left shoulder 10/21/2016  . Gout attack 04/02/2016  . Angina pectoris ([place]) 08/01/2014  . Hemorrhage of rectum and anus 03/28/2014  . Neuropathy, peripheral 05/15/2012  . Pulmonary embolism ([place]) 07/11/2011  . DVT (deep venous thrombosis) ([place]) 07/11/2011  . Abdominal pain, left lower quadrant 01/29/2011  . Long term current use of anticoagulant therapy 01/29/2011  . Diarrhea 01/29/2011   Past Medical History:  Diagnosis Date  . Anxiety and depression   . Back pain   . Diverticulitis   . Diverticulosis   . DVT (deep venous thrombosis) ([place])    . Gout   . Hernia   . History of blood clots   . Hypertension   . Nausea and vomiting   . Neuropathy, peripheral 05/15/2012  . Shingles     Family History  Problem Relation Age of Onset  . Prostate cancer Father   . Heart disease Father   . Diabetes Mother   . Heart disease Mother   . Colon cancer Maternal Uncle        dx in his 47's  . Pancreatic cancer Paternal Uncle   . Prostate cancer Paternal Uncle   . Multiple sclerosis Daughter   . Prostate cancer Paternal Uncle     Past Surgical History:  Procedure Laterality Date  . APPENDECTOMY    . BACK SURGERY    . CHOLECYSTECTOMY  2008  . HERNIA REPAIR    . HIATAL HERNIA REPAIR    . LEFT HEART CATHETERIZATION WITH CORONARY ANGIOGRAM N/A 08/02/2014   Procedure: LEFT HEART CATHETERIZATION WITH CORONARY ANGIOGRAM;  Surgeon: Laverda Page, MD;  Location: [facility] CATH LAB;  Service: Cardiovascular;  Laterality: N/A;  . MULTIPLE EXTRACTIONS WITH ALVEOLOPLASTY N/A 10/19/2015   Procedure: Extraction of tooth #'s 2,12,13 with alveoloplasty;  Surgeon: Lenn Cal, DDS;  Location: [facility];  Service: Oral Surgery;  Laterality: N/A;  . SACRAL NERVE STIMULATOR PLACEMENT  2011   Social History   Occupational History  . unemployed Other  .  Retired   Social History Main Topics  . Smoking status: Current Every Day Smoker    Packs/day: 0.50    Years: 40.00    Types: Cigarettes  . Smokeless tobacco: Never Used     Comment: sometimes less.  . Alcohol use 0.0 oz/week     Comment: Occasional  . Drug use: No  . Sexual activity: Not on file

## 2017-07-09 LAB — SYNOVIAL CELL COUNT + DIFF, W/ CRYSTALS
Basophils, %: 0 %
Eosinophils-Synovial: 0 % (ref 0–2)
Lymphocytes-Synovial Fld: 42 % (ref 0–74)
Monocyte/Macrophage: 45 % (ref 0–69)
Neutrophil, Synovial: 3 % (ref 0–24)
Synoviocytes, %: 10 % (ref 0–15)
WBC, Synovial: 225 cells/uL — ABNORMAL HIGH (ref <150)

## 2017-07-09 LAB — TIQ-NTM

## 2017-07-09 NOTE — Progress Notes (Signed)
Please let him know the fluid shows inflammation from arthritis

## 2017-07-28 ENCOUNTER — Other Ambulatory Visit: Payer: Self-pay | Admitting: Oncology

## 2017-07-29 ENCOUNTER — Ambulatory Visit (INDEPENDENT_AMBULATORY_CARE_PROVIDER_SITE_OTHER): Payer: Medicare Other | Admitting: Orthopaedic Surgery

## 2017-07-29 ENCOUNTER — Encounter (INDEPENDENT_AMBULATORY_CARE_PROVIDER_SITE_OTHER): Payer: Self-pay | Admitting: Orthopaedic Surgery

## 2017-07-29 DIAGNOSIS — M1711 Unilateral primary osteoarthritis, right knee: Secondary | ICD-10-CM

## 2017-07-29 MED ORDER — LIDOCAINE HCL 1 % IJ SOLN
2.0000 mL | INTRAMUSCULAR | Status: AC | PRN
  Administered 2017-07-29: 2 mL

## 2017-07-29 MED ORDER — BUPIVACAINE HCL 0.5 % IJ SOLN
2.0000 mL | INTRAMUSCULAR | Status: AC | PRN
  Administered 2017-07-29: 2 mL via INTRA_ARTICULAR

## 2017-07-29 MED ORDER — METHYLPREDNISOLONE ACETATE 40 MG/ML IJ SUSP
40.0000 mg | INTRAMUSCULAR | Status: AC | PRN
  Administered 2017-07-29: 40 mg via INTRA_ARTICULAR

## 2017-07-29 NOTE — Progress Notes (Signed)
Office Visit Note   Patient: [name] [name]           Date of Birth: Mar 25, 1951           MRN: [id number] Visit Date: 07/29/2017              Requested by: Elwyn Reach, MD [place], [place] [address] PCP: Elwyn Reach, MD   Assessment & Plan: Visit Diagnoses:  1. Primary osteoarthritis of right knee     Plan: Impression is advanced degenerative joint disease of the right knee.  Patient requested a repeat aspiration and injection today.  He does wish to undergo a total knee replacement sometime in January.  We will get  medical preoperative clearance from [name].  He is on Xarelto chronically for history of DVT.  Follow-Up Instructions: Return if symptoms worsen or fail to improve.   Orders:  No orders of the defined types were placed in this encounter.  No orders of the defined types were placed in this encounter.     Procedures: Large Joint Inj: R knee on 07/29/2017 9:11 AM Indications: pain Details: 22 G needle  Arthrogram: No  Medications: 40 mg methylPREDNISolone acetate 40 MG/ML; 2 mL lidocaine 1 %; 2 mL bupivacaine 0.5 % Aspirate: 35 mL Outcome: tolerated well, no immediate complications Consent was given by the patient. Patient was prepped and draped in the usual sterile fashion.       Clinical Data: No additional findings.   Subjective: Chief Complaint  Patient presents with  . Right Knee - Pain    [name] follows up for continued right knee pain.  He had partial relief from the previous aspiration injection.  He continues to have significant pain.    Review of Systems   Objective: Vital Signs: There were no vitals taken for this visit.  Physical Exam  Ortho Exam Right knee exam shows a small joint effusion.  No signs of infection. Specialty Comments:  No specialty comments available.  Imaging: No results found.   PMFS History: Patient Active Problem List   Diagnosis Date Noted  . Primary  osteoarthritis of right knee 07/08/2017  . Complete rotator cuff tear of left shoulder 10/21/2016  . Gout attack 04/02/2016  . Angina pectoris ([place]) 08/01/2014  . Hemorrhage of rectum and anus 03/28/2014  . Neuropathy, peripheral 05/15/2012  . Pulmonary embolism ([place]) 07/11/2011  . DVT (deep venous thrombosis) ([place]) 07/11/2011  . Abdominal pain, left lower quadrant 01/29/2011  . Long term current use of anticoagulant therapy 01/29/2011  . Diarrhea 01/29/2011   Past Medical History:  Diagnosis Date  . Anxiety and depression   . Back pain   . Diverticulitis   . Diverticulosis   . DVT (deep venous thrombosis) ([place])   . Gout   . Hernia   . History of blood clots   . Hypertension   . Nausea and vomiting   . Neuropathy, peripheral 05/15/2012  . Shingles     Family History  Problem Relation Age of Onset  . Prostate cancer Father   . Heart disease Father   . Diabetes Mother   . Heart disease Mother   . Colon cancer Maternal Uncle        dx in his 53's  . Pancreatic cancer Paternal Uncle   . Prostate cancer Paternal Uncle   . Multiple sclerosis Daughter   . Prostate cancer Paternal Uncle     Past Surgical History:  Procedure Laterality Date  .  APPENDECTOMY    . BACK SURGERY    . CHOLECYSTECTOMY  2008  . Extraction of tooth #'s 2,12,13 with alveoloplasty N/A 10/19/2015   Performed by Lenn Cal, DDS at [facility]  . HERNIA REPAIR    . HIATAL HERNIA REPAIR    . LEFT HEART CATHETERIZATION WITH CORONARY ANGIOGRAM N/A 08/02/2014   Performed by Adrian Prows, MD at [facility] CATH LAB  . SACRAL NERVE STIMULATOR PLACEMENT  2011   Social History   Occupational History  . Occupation: unemployed    Fish farm manager: OTHER    Employer: RETIRED  Tobacco Use  . Smoking status: Current Every Day Smoker    Packs/day: 0.50    Years: 40.00    Pack years: 20.00    Types: Cigarettes  . Smokeless tobacco: Never Used  . Tobacco comment: sometimes less.  Substance and Sexual Activity  . Alcohol use:  Yes    Alcohol/week: 0.0 oz    Comment: Occasional  . Drug use: No  . Sexual activity: Not on file

## 2017-07-30 ENCOUNTER — Emergency Department (HOSPITAL_COMMUNITY): Payer: Medicare Other

## 2017-07-30 ENCOUNTER — Encounter (HOSPITAL_COMMUNITY): Payer: Self-pay

## 2017-07-30 ENCOUNTER — Emergency Department (HOSPITAL_COMMUNITY)
Admission: EM | Admit: 2017-07-30 | Discharge: 2017-07-30 | Disposition: A | Discharge status: Home / Self Care | Payer: Medicare Other | Attending: Emergency Medicine | Admitting: Emergency Medicine

## 2017-07-30 ENCOUNTER — Other Ambulatory Visit: Payer: Self-pay

## 2017-07-30 DIAGNOSIS — I10 Essential (primary) hypertension: Secondary | ICD-10-CM | POA: Diagnosis not present

## 2017-07-30 DIAGNOSIS — J40 Bronchitis, not specified as acute or chronic: Secondary | ICD-10-CM

## 2017-07-30 DIAGNOSIS — F17210 Nicotine dependence, cigarettes, uncomplicated: Secondary | ICD-10-CM | POA: Insufficient documentation

## 2017-07-30 DIAGNOSIS — Z79899 Other long term (current) drug therapy: Secondary | ICD-10-CM | POA: Insufficient documentation

## 2017-07-30 DIAGNOSIS — J069 Acute upper respiratory infection, unspecified: Secondary | ICD-10-CM | POA: Diagnosis present

## 2017-07-30 DIAGNOSIS — J209 Acute bronchitis, unspecified: Secondary | ICD-10-CM | POA: Insufficient documentation

## 2017-07-30 MED ORDER — DM-GUAIFENESIN ER 30-600 MG PO TB12: 1.0000 | ORAL_TABLET | Freq: Two times a day (BID) | ORAL | 0 refills | Status: DC

## 2017-07-30 MED ORDER — IPRATROPIUM-ALBUTEROL 0.5-2.5 (3) MG/3ML IN SOLN
3.0000 mL | Freq: Once | RESPIRATORY_TRACT | Status: AC
  Administered 2017-07-30: 3 mL via RESPIRATORY_TRACT
  Filled 2017-07-30: qty 3

## 2017-07-30 MED ORDER — PREDNISONE 20 MG PO TABS: 60.0000 mg | ORAL_TABLET | Freq: Every day | ORAL | 0 refills | Status: AC

## 2017-07-30 MED ORDER — PREDNISONE 20 MG PO TABS
60.0000 mg | ORAL_TABLET | Freq: Once | ORAL | Status: AC
  Administered 2017-07-30: 60 mg via ORAL
  Filled 2017-07-30: qty 3

## 2017-07-30 NOTE — ED Provider Notes (Signed)
[facility] EMERGENCY DEPARTMENT Provider Note   CSN: [id number] Arrival date & time: 07/30/17  1041     History   Chief Complaint Chief Complaint  Patient presents with  . URI  . Fatigue    HPI [name] [name] is a 66 y.o. male.  HPI  66 y.o. male with a hx of HTN, presents to the Emergency Department today due to URI symptoms x 3 weeks. Seen by PCP and given two rounds of antibiotics, steroids, and pro air. Notes minimal to moderate relief. States that his main concern is the cough that does not seem to be going away. Denies hemoptysis. Denies fevers. Notes associated sinus congestion, rhinorrhea, and ear pressure. No N/V/D. No CP/ABD pain. Notes mild shortness of breath when he gets up and starts walking around, but resolves with rest. No headaches. No visual changes. No neck stiffness. Denies pain currently. No meds PTA. 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 Has been compliant with Xarelto ever since. No other symptoms noted   Past Medical History:  Diagnosis Date  . Anxiety and depression   . Back pain   . Diverticulitis   . Diverticulosis   . DVT (deep venous thrombosis) ([place])   . Gout   . Hernia   . History of blood clots   . Hypertension   . Nausea and vomiting   . Neuropathy, peripheral 05/15/2012  . Shingles     Patient Active Problem List   Diagnosis Date Noted  . Primary osteoarthritis of right knee 07/08/2017  . Complete rotator cuff tear of left shoulder 10/21/2016  . Gout attack 04/02/2016  . Angina pectoris ([place]) 08/01/2014  . Hemorrhage of rectum and anus 03/28/2014  . Neuropathy, peripheral 05/15/2012  . Pulmonary embolism ([place]) 07/11/2011  . DVT (deep venous thrombosis) ([place]) 07/11/2011  . Abdominal pain, left lower quadrant 01/29/2011  . Long term current use of anticoagulant therapy 01/29/2011  . Diarrhea 01/29/2011    Past Surgical History:  Procedure Laterality Date  . APPENDECTOMY    . BACK SURGERY    .  CHOLECYSTECTOMY  2008  . HERNIA REPAIR    . HIATAL HERNIA REPAIR    . LEFT HEART CATHETERIZATION WITH CORONARY ANGIOGRAM N/A 08/02/2014   Procedure: LEFT HEART CATHETERIZATION WITH CORONARY ANGIOGRAM;  Surgeon: Laverda Page, MD;  Location: [facility] CATH LAB;  Service: Cardiovascular;  Laterality: N/A;  . MULTIPLE EXTRACTIONS WITH ALVEOLOPLASTY N/A 10/19/2015   Procedure: Extraction of tooth #'s 2,12,13 with alveoloplasty;  Surgeon: Lenn Cal, DDS;  Location: [facility];  Service: Oral Surgery;  Laterality: N/A;  . SACRAL NERVE STIMULATOR PLACEMENT  2011       Home Medications    Prior to Admission medications   Medication Sig Start Date End Date Taking? Authorizing Provider  albuterol (PROVENTIL HFA;VENTOLIN HFA) 108 (90 Base) MCG/ACT inhaler Inhale 2 puffs into the lungs every 6 (six) hours as needed for wheezing or shortness of breath. 01/27/17   Varney Biles, MD  colchicine 0.6 MG tablet Take 1 tablet (0.6 mg total) by mouth 2 (two) times daily. Take 2 tablets for first dose then 1 pill twice daily for gout attack 04/02/16 06/30/17  [name], MD  cyclobenzaprine (FLEXERIL) 10 MG tablet Take 10 mg by mouth 3 (three) times daily as needed for muscle spasms.  02/15/16   [provider]  Diclofenac Sodium (PENNSAID) 2 % SOLN Place 2 Squirts onto the skin 2 (two) times daily. 10/21/16  Meredith Pel, MD  doxycycline (VIBRAMYCIN) 100 MG capsule Take 1 capsule (100 mg total) by mouth 2 (two) times daily. 01/27/17   Varney Biles, MD  DULoxetine (CYMBALTA) 60 MG capsule Take 120 mg by mouth daily.     [provider]  enoxaparin (LOVENOX) 120 MG/0.8ML injection Inject 0.8 mLs (120 mg total) into the skin every 12 (twelve) hours. 02/25/16   [name], MD  metoprolol tartrate (LOPRESSOR) 25 MG tablet Take 25 mg by mouth 2 (two) times daily. 07/29/14   [provider]  NIFEDICAL XL 60 MG 24 hr tablet Take 120 mg by mouth daily.  02/21/14   [provider]  ondansetron (ZOFRAN) 4 MG tablet Take 1 tablet (4 mg total) by mouth every 8 (eight) hours as needed for nausea or vomiting. 03/17/15   [name], MD  oxyCODONE-acetaminophen (PERCOCET/ROXICET) 5-325 MG tablet Take 1 tablet by mouth every 6 (six) hours as needed for severe pain. 06/30/17   Blanchie Dessert, MD  predniSONE (DELTASONE) 10 MG tablet Take 6 tablets (60 mg total) by mouth daily. 01/27/17   Varney Biles, MD  PROAIR HFA 108 (90 BASE) MCG/ACT inhaler Inhale 1 puff into the lungs every 6 (six) hours as needed for wheezing.  03/23/14   [provider]  valsartan-hydrochlorothiazide (DIOVAN-HCT) 320-25 MG per tablet Take 1 tablet by mouth daily.    [provider]  VOLTAREN 1 % GEL Apply 2 g topically daily as needed (pain). For neck and shoulder pain 02/23/14   [provider]  XARELTO 20 MG TABS tablet take 1 tablet by mouth once daily WITH SUPPER 01/30/17   [name], MD    Family History Family History  Problem Relation Age of Onset  . Prostate cancer Father   . Heart disease Father   . Diabetes Mother   . Heart disease Mother   . Colon cancer Maternal Uncle        dx in his 45's  . Pancreatic cancer Paternal Uncle   . Prostate cancer Paternal Uncle   . Multiple sclerosis Daughter   . Prostate cancer Paternal Uncle     Social History Social History   Tobacco Use  . Smoking status: Current Every Day Smoker    Packs/day: 0.50    Years: 40.00    Pack years: 20.00    Types: Cigarettes  . Smokeless tobacco: Never Used  . Tobacco comment: sometimes less.  Substance Use Topics  . Alcohol use: Yes    Alcohol/week: 0.0 oz    Comment: Occasional  . Drug use: No     Allergies   Aspirin; Celecoxib; Ibuprofen; Lyrica [pregabalin]; and Vicodin [hydrocodone-acetaminophen]   Review of Systems Review of Systems ROS reviewed and all are negative for acute change except as noted in the HPI.  Physical Exam Updated  Vital Signs BP (!) 142/92 (BP Location: Right Arm)   Pulse 81   Temp 98 F (36.7 C) (Oral)   Resp 18   SpO2 100%   Physical Exam  Constitutional: He is oriented to person, place, and time. Vital signs are normal. He appears well-developed and well-nourished. No distress.  HENT:  Head: Normocephalic and atraumatic.  Right Ear: Hearing, tympanic membrane, external ear and ear canal normal.  Left Ear: Hearing, tympanic membrane, external ear and ear canal normal.  Nose: Nose normal.  Mouth/Throat: Uvula is midline, oropharynx is clear and moist and mucous membranes are normal. No trismus in the jaw. No oropharyngeal exudate,  posterior oropharyngeal erythema or tonsillar abscesses.  Eyes: Conjunctivae and EOM are normal. Pupils are equal, round, and reactive to light.  Neck: Normal range of motion. Neck supple. No tracheal deviation present.  Cardiovascular: Normal rate, regular rhythm, S1 normal, S2 normal, normal heart sounds, intact distal pulses and normal pulses.  Pulmonary/Chest: Effort normal. No respiratory distress. He has no decreased breath sounds. He has wheezes in the right upper field, the right lower field, the left upper field and the left lower field. He has no rhonchi. He has no rales.  Abdominal: Normal appearance and bowel sounds are normal. There is no tenderness.  Musculoskeletal: Normal range of motion.  Neurological: He is alert and oriented to person, place, and time.  Skin: Skin is warm and dry.  Psychiatric: He has a normal mood and affect. His speech is normal and behavior is normal. Thought content normal.  Nursing note and vitals reviewed.    ED Treatments / Results  Labs (all labs ordered are listed, but only abnormal results are displayed) Labs Reviewed - No data to display  EKG  EKG Interpretation None       Radiology Dg Chest 2 View  Addendum Date: 07/30/2017   ADDENDUM REPORT: 07/30/2017 11:23 ADDENDUM: There is postoperative change in the  right shoulder, also present previously. Electronically Signed   By: Lowella Grip III M.D.   On: 07/30/2017 11:23   Result Date: 07/30/2017 CLINICAL DATA:  Shortness of breath and chest pain with cough EXAM: CHEST  2 VIEW COMPARISON:  Jan 27, 2017 FINDINGS: There is no edema or consolidation. The heart size and pulmonary vascularity are normal. No adenopathy. There is aortic atherosclerosis. Thoracic stimulator tips are in the lower thoracic region. No pneumothorax. There is mild degenerative change in thoracic spine. IMPRESSION: No edema or consolidation. Stimulator tips in lower thoracic region. Aortic atherosclerosis. No evident adenopathy. Aortic Atherosclerosis (ICD10-I70.0). Electronically Signed: By: Lowella Grip III M.D. On: 07/30/2017 11:19    Procedures Procedures (including critical care time)  Medications Ordered in ED Medications  predniSONE (DELTASONE) tablet 60 mg (not administered)  ipratropium-albuterol (DUONEB) 0.5-2.5 (3) MG/3ML nebulizer solution 3 mL (3 mLs Nebulization Given by Other 07/30/17 1326)     Initial Impression / Assessment and Plan / ED Course  I have reviewed the triage vital signs and the nursing notes.  Pertinent labs & imaging results that were available during my care of the patient were reviewed by me and considered in my medical decision making (see chart for details).  Final Clinical Impressions(s) / ED Diagnoses   {I have reviewed and evaluated the relevant imaging studies.  {I have reviewed the relevant previous healthcare records.  {I obtained HPI from historian. {Patient discussed with supervising physician.  ED Course:  Assessment: Pt is a 66 y.o. male with a hx of HTN, presents to the Emergency Department today due to URI symptoms x 3 weeks. Seen by PCP and given two rounds of antibiotics, steroids, and pro air. Notes minimal to moderate relief. States that his main concern is the cough that does not seem to be going away. Denies  fevers. Notes associated sinus congestion, rhinorrhea, and ear pressure. No N/V/D. No CP/ABD pain. Notes mild shortness of breath when he gets up and stats walking around, but resolves with rest. No headaches. No visual changes. No neck stiffness. Denies pain currently. No meds PTA. On exam, pt in NAD. Nontoxic/nonseptic appearing. VSS. Afebrile. Lungs with bilateral wheeze. Heart RRR. Abdomen nontender soft. CXR unremarkable.  Given neb Tx in ED. With improvement. Will restart on steroid burst and close follow up to PCP. Doubt PE or acute pathology as pt has been compliant with Xarelto since 1990s without incident. Plan is to DC home with follow up to PCP. At time of discharge, Patient is in no acute distress. Vital Signs are stable. Patient is able to ambulate. Patient able to tolerate PO.   Disposition/Plan:  DC Home Additional Verbal discharge instructions given and discussed with patient.  Pt Instructed to f/u with PCP in the next week for evaluation and treatment of symptoms. Return precautions given Pt acknowledges and agrees with plan  Supervising Physician Duffy Bruce, MD  Final diagnoses:  Upper respiratory tract infection, unspecified type  Bronchitis    ED Discharge Orders    None       Shary Decamp, PA-C 07/30/17 1350    Duffy Bruce, MD 07/30/17 2238

## 2017-07-30 NOTE — ED Notes (Signed)
Tyler PA at bedside   

## 2017-07-30 NOTE — ED Notes (Signed)
Patient understands discharge. Signature pad not working

## 2017-07-30 NOTE — Discharge Instructions (Signed)
Please read and follow all provided instructions.  Your diagnoses today include:  1. Upper respiratory tract infection, unspecified type   2. Bronchitis    Tests performed today include: Vital signs. See below for your results today.   Medications prescribed:  Take as prescribed   Home care instructions:  Follow any educational materials contained in this packet.  Follow-up instructions: Please follow-up with your primary care provider for further evaluation of symptoms and treatment   Return instructions:  Please return to the Emergency Department if you do not get better, if you get worse, or new symptoms OR  - Fever (temperature greater than 101.27F)  - Bleeding that does not stop with holding pressure to the area    -Severe pain (please note that you may be more sore the day after your accident)  - Chest Pain  - Difficulty breathing  - Severe nausea or vomiting  - Inability to tolerate food and liquids  - Passing out  - Skin becoming red around your wounds  - Change in mental status (confusion or lethargy)  - New numbness or weakness    Please return if you have any other emergent concerns.  Additional Information:  Your vital signs today were: BP 136/90    Pulse 72    Temp 98 F (36.7 C) (Oral)    Resp 18    SpO2 96%  If your blood pressure (BP) was elevated above 135/85 this visit, please have this repeated by your doctor within one month. ---------------

## 2017-07-30 NOTE — ED Triage Notes (Signed)
Pt states he has had URI X3 weeks. He states his PCP has given him medications without improvement. He reports generalized body aches, cough, fatigue.

## 2017-08-07 ENCOUNTER — Encounter: Payer: Medicare Other | Admitting: Vascular Surgery

## 2017-08-07 ENCOUNTER — Encounter (HOSPITAL_COMMUNITY): Payer: Self-pay

## 2017-08-21 ENCOUNTER — Encounter: Payer: Medicare Other | Admitting: Vascular Surgery

## 2017-08-21 ENCOUNTER — Encounter (HOSPITAL_COMMUNITY): Payer: Self-pay

## 2017-08-27 ENCOUNTER — Other Ambulatory Visit: Payer: Self-pay | Admitting: Oncology

## 2017-09-05 ENCOUNTER — Encounter (HOSPITAL_COMMUNITY): Payer: Self-pay | Admitting: Emergency Medicine

## 2017-09-05 ENCOUNTER — Ambulatory Visit (HOSPITAL_COMMUNITY)
Admission: EM | Admit: 2017-09-05 | Discharge: 2017-09-05 | Disposition: A | Discharge status: Home / Self Care | Payer: Medicare Other | Attending: Internal Medicine | Admitting: Internal Medicine

## 2017-09-05 DIAGNOSIS — K0889 Other specified disorders of teeth and supporting structures: Secondary | ICD-10-CM

## 2017-09-05 MED ORDER — AMOXICILLIN-POT CLAVULANATE 875-125 MG PO TABS: 1.0000 | ORAL_TABLET | Freq: Two times a day (BID) | ORAL | 0 refills | Status: AC

## 2017-09-05 MED ORDER — TRAMADOL HCL 50 MG PO TABS: 50.0000 mg | ORAL_TABLET | Freq: Four times a day (QID) | ORAL | 0 refills | Status: DC | PRN

## 2017-09-05 NOTE — ED Triage Notes (Signed)
PT reports dental pain that started yesterday.

## 2017-09-05 NOTE — Discharge Instructions (Signed)
Please call dentist as soon as possible to work with dentist and PCP as you are on a blood thinner to get definitive treatment for your dental pain.

## 2017-09-05 NOTE — ED Provider Notes (Signed)
[facility]    CSN: [id number] Arrival date & time: 09/05/17  1627     History   Chief Complaint Chief Complaint  Patient presents with  . Dental Pain    HPI [name] [name] is a 66 y.o. male.   [name] presents with complaints of right upper molar pain after his took cracked and "crumbled" which occurred 12/25. He has had surgery to tooth/jaw in 2017 due to an abscess but could not remember the name of the surgeon, had not called to set up an appointment yet. History of afib, is on a blood thinner. Pain is 10/10. Has tried many treatments at home which have not helped. Swelling present. Without fevers. Took tylenol which has not helped.   ROS per HPI.       Past Medical History:  Diagnosis Date  . Anxiety and depression   . Back pain   . Diverticulitis   . Diverticulosis   . DVT (deep venous thrombosis) ([place])   . Gout   . Hernia   . History of blood clots   . Hypertension   . Nausea and vomiting   . Neuropathy, peripheral 05/15/2012  . Shingles     Patient Active Problem List   Diagnosis Date Noted  . Primary osteoarthritis of right knee 07/08/2017  . Complete rotator cuff tear of left shoulder 10/21/2016  . Gout attack 04/02/2016  . Angina pectoris ([place]) 08/01/2014  . Hemorrhage of rectum and anus 03/28/2014  . Neuropathy, peripheral 05/15/2012  . Pulmonary embolism ([place]) 07/11/2011  . DVT (deep venous thrombosis) ([place]) 07/11/2011  . Abdominal pain, left lower quadrant 01/29/2011  . Long term current use of anticoagulant therapy 01/29/2011  . Diarrhea 01/29/2011    Past Surgical History:  Procedure Laterality Date  . APPENDECTOMY    . BACK SURGERY    . CHOLECYSTECTOMY  2008  . HERNIA REPAIR    . HIATAL HERNIA REPAIR    . LEFT HEART CATHETERIZATION WITH CORONARY ANGIOGRAM N/A 08/02/2014   Procedure: LEFT HEART CATHETERIZATION WITH CORONARY ANGIOGRAM;  Surgeon: Laverda Page, MD;  Location: [facility] CATH LAB;  Service: Cardiovascular;   Laterality: N/A;  . MULTIPLE EXTRACTIONS WITH ALVEOLOPLASTY N/A 10/19/2015   Procedure: Extraction of tooth #'s 2,12,13 with alveoloplasty;  Surgeon: Lenn Cal, DDS;  Location: [facility];  Service: Oral Surgery;  Laterality: N/A;  . SACRAL NERVE STIMULATOR PLACEMENT  2011       Home Medications    Prior to Admission medications   Medication Sig Start Date End Date Taking? Authorizing Provider  albuterol (PROVENTIL HFA;VENTOLIN HFA) 108 (90 Base) MCG/ACT inhaler Inhale 2 puffs into the lungs every 6 (six) hours as needed for wheezing or shortness of breath. 01/27/17   Varney Biles, MD  amoxicillin-clavulanate (AUGMENTIN) 875-125 MG tablet Take 1 tablet by mouth every 12 (twelve) hours for 10 days. 09/05/17 09/15/17  Augusto Gamble B, NP  colchicine 0.6 MG tablet Take 1 tablet (0.6 mg total) by mouth 2 (two) times daily. Take 2 tablets for first dose then 1 pill twice daily for gout attack 04/02/16 06/30/17  [name], MD  cyclobenzaprine (FLEXERIL) 10 MG tablet Take 10 mg by mouth 3 (three) times daily as needed for muscle spasms.  02/15/16   [provider]  dextromethorphan-guaiFENesin (MUCINEX DM) 30-600 MG 12hr tablet Take 1 tablet by mouth 2 (two) times daily. 07/30/17   Shary Decamp, PA-C  Diclofenac Sodium (PENNSAID) 2 % SOLN Place 2 Squirts onto the skin  2 (two) times daily. 10/21/16   Meredith Pel, MD  doxycycline (VIBRAMYCIN) 100 MG capsule Take 1 capsule (100 mg total) by mouth 2 (two) times daily. 01/27/17   Varney Biles, MD  DULoxetine (CYMBALTA) 60 MG capsule Take 120 mg by mouth daily.     [provider]  enoxaparin (LOVENOX) 120 MG/0.8ML injection Inject 0.8 mLs (120 mg total) into the skin every 12 (twelve) hours. 02/25/16   [name], MD  metoprolol tartrate (LOPRESSOR) 25 MG tablet Take 25 mg by mouth 2 (two) times daily. 07/29/14   [provider]  NIFEDICAL XL 60 MG 24 hr tablet Take 120 mg by mouth daily.  02/21/14    [provider]  ondansetron (ZOFRAN) 4 MG tablet Take 1 tablet (4 mg total) by mouth every 8 (eight) hours as needed for nausea or vomiting. 03/17/15   [name], MD  oxyCODONE-acetaminophen (PERCOCET/ROXICET) 5-325 MG tablet Take 1 tablet by mouth every 6 (six) hours as needed for severe pain. 06/30/17   Blanchie Dessert, MD  PROAIR HFA 108 (90 BASE) MCG/ACT inhaler Inhale 1 puff into the lungs every 6 (six) hours as needed for wheezing.  03/23/14   [provider]  traMADol (ULTRAM) 50 MG tablet Take 1 tablet (50 mg total) by mouth every 6 (six) hours as needed. 09/05/17   Zigmund Gottron, NP  valsartan-hydrochlorothiazide (DIOVAN-HCT) 320-25 MG per tablet Take 1 tablet by mouth daily.    [provider]  VOLTAREN 1 % GEL Apply 2 g topically daily as needed (pain). For neck and shoulder pain 02/23/14   [provider]  XARELTO 20 MG TABS tablet take 1 tablet by mouth once daily WITH SUPPER 01/30/17   [name], MD    Family History Family History  Problem Relation Age of Onset  . Prostate cancer Father   . Heart disease Father   . Diabetes Mother   . Heart disease Mother   . Colon cancer Maternal Uncle        dx in his 74's  . Pancreatic cancer Paternal Uncle   . Prostate cancer Paternal Uncle   . Multiple sclerosis Daughter   . Prostate cancer Paternal Uncle     Social History Social History   Tobacco Use  . Smoking status: Current Every Day Smoker    Packs/day: 0.50    Years: 40.00    Pack years: 20.00    Types: Cigarettes  . Smokeless tobacco: Never Used  . Tobacco comment: sometimes less.  Substance Use Topics  . Alcohol use: Yes    Alcohol/week: 0.0 oz    Comment: Occasional  . Drug use: No     Allergies   Aspirin; Celecoxib; Ibuprofen; Lyrica [pregabalin]; and Vicodin [hydrocodone-acetaminophen]   Review of Systems Review of Systems   Physical Exam Triage Vital Signs ED Triage Vitals  Enc Vitals Group       BP 09/05/17 1705 (!) 160/96     Pulse Rate 09/05/17 1704 (!) 115     Resp 09/05/17 1704 16     Temp 09/05/17 1704 98.9 F (37.2 C)     Temp Source 09/05/17 1704 Oral     SpO2 09/05/17 1704 100 %     Weight 09/05/17 1704 257 lb (116.6 kg)     Height 09/05/17 1704 6\' 1"  (1.854 m)     Head Circumference --      Peak Flow --      Pain Score 09/05/17 1705 10  Pain Loc --      Pain Edu? --      Excl. in [place]? --    No data found.  Updated Vital Signs BP (!) 160/96   Pulse (!) 115   Temp 98.9 F (37.2 C) (Oral)   Resp 16   Ht 6\' 1"  (1.854 m)   Wt 257 lb (116.6 kg)   SpO2 100%   BMI 33.91 kg/m   Visual Acuity Right Eye Distance:   Left Eye Distance:   Bilateral Distance:    Right Eye Near:   Left Eye Near:    Bilateral Near:     Physical Exam  Constitutional: He is oriented to person, place, and time. He appears well-developed and well-nourished.  HENT:  Mouth/Throat:    Broken right upper molar; right facial soft tissue swelling; tenderness with palpation right upper jaw; without apparent abscess visible  Cardiovascular: Tachycardia present.  Pulmonary/Chest: Effort normal and breath sounds normal.  Neurological: He is alert and oriented to person, place, and time.  Skin: Skin is warm and dry.     UC Treatments / Results  Labs (all labs ordered are listed, but only abnormal results are displayed) Labs Reviewed - No data to display  EKG  EKG Interpretation None       Radiology No results found.  Procedures Procedures (including critical care time)  Medications Ordered in UC Medications - No data to display   Initial Impression / Assessment and Plan / UC Course  I have reviewed the triage vital signs and the nursing notes.  Pertinent labs & imaging results that were available during my care of the patient were reviewed by me and considered in my medical decision making (see chart for details).     augmentin initiated; 15 tabs of tramadol  at this time for pain control. To follow up with dentist asap as may have to work with PCP due to blood thinner and afib history. Patient verbalized understanding and agreeable to plan.    Final Clinical Impressions(s) / UC Diagnoses   Final diagnoses:  Pain, dental    ED Discharge Orders        Ordered    amoxicillin-clavulanate (AUGMENTIN) 875-125 MG tablet  Every 12 hours     09/05/17 1736    traMADol (ULTRAM) 50 MG tablet  Every 6 hours PRN     09/05/17 1736       Controlled Substance Prescriptions [place] Controlled Substance Registry consulted? Yes, I have consulted the [place] Controlled Substances Registry for this patient, and feel the risk/benefit ratio today is favorable for proceeding with this prescription for a controlled substance.   Zigmund Gottron, NP 09/05/17 [phone]

## 2017-09-26 ENCOUNTER — Other Ambulatory Visit: Payer: Self-pay | Admitting: Oncology

## 2017-09-29 ENCOUNTER — Ambulatory Visit: Payer: Self-pay | Admitting: Oncology

## 2017-09-30 ENCOUNTER — Ambulatory Visit: Payer: Self-pay | Admitting: Oncology

## 2017-10-07 ENCOUNTER — Other Ambulatory Visit: Payer: Self-pay | Admitting: Oncology

## 2017-10-07 DIAGNOSIS — Z7901 Long term (current) use of anticoagulants: Secondary | ICD-10-CM

## 2017-10-07 DIAGNOSIS — I82401 Acute embolism and thrombosis of unspecified deep veins of right lower extremity: Secondary | ICD-10-CM

## 2017-10-07 NOTE — Telephone Encounter (Signed)
Message sent to [facility] S to re-schedule his appt .

## 2017-10-07 NOTE — Telephone Encounter (Signed)
Call pt: he missed appt with me -  I will only give him a 1 month refill on Xarelto until he re-schedules

## 2017-10-28 ENCOUNTER — Encounter: Payer: Self-pay | Admitting: Oncology

## 2017-11-04 ENCOUNTER — Other Ambulatory Visit: Payer: Self-pay

## 2017-11-04 ENCOUNTER — Ambulatory Visit (INDEPENDENT_AMBULATORY_CARE_PROVIDER_SITE_OTHER): Payer: Medicare Other | Admitting: Oncology

## 2017-11-04 ENCOUNTER — Encounter: Payer: Self-pay | Admitting: Oncology

## 2017-11-04 VITALS — BP 143/86 | HR 90 | Temp 98.7°F | Ht 76.0 in | Wt 263.0 lb

## 2017-11-04 DIAGNOSIS — J111 Influenza due to unidentified influenza virus with other respiratory manifestations: Secondary | ICD-10-CM | POA: Diagnosis not present

## 2017-11-04 DIAGNOSIS — G3189 Other specified degenerative diseases of nervous system: Secondary | ICD-10-CM | POA: Diagnosis not present

## 2017-11-04 DIAGNOSIS — Z7901 Long term (current) use of anticoagulants: Secondary | ICD-10-CM

## 2017-11-04 DIAGNOSIS — G8929 Other chronic pain: Secondary | ICD-10-CM

## 2017-11-04 DIAGNOSIS — M75102 Unspecified rotator cuff tear or rupture of left shoulder, not specified as traumatic: Secondary | ICD-10-CM

## 2017-11-04 DIAGNOSIS — Z9049 Acquired absence of other specified parts of digestive tract: Secondary | ICD-10-CM

## 2017-11-04 DIAGNOSIS — I2782 Chronic pulmonary embolism: Secondary | ICD-10-CM

## 2017-11-04 DIAGNOSIS — B0229 Other postherpetic nervous system involvement: Secondary | ICD-10-CM | POA: Diagnosis not present

## 2017-11-04 DIAGNOSIS — D509 Iron deficiency anemia, unspecified: Secondary | ICD-10-CM

## 2017-11-04 DIAGNOSIS — M549 Dorsalgia, unspecified: Secondary | ICD-10-CM | POA: Diagnosis not present

## 2017-11-04 DIAGNOSIS — I10 Essential (primary) hypertension: Secondary | ICD-10-CM

## 2017-11-04 DIAGNOSIS — Z86711 Personal history of pulmonary embolism: Secondary | ICD-10-CM | POA: Diagnosis not present

## 2017-11-04 DIAGNOSIS — Z9689 Presence of other specified functional implants: Secondary | ICD-10-CM

## 2017-11-04 DIAGNOSIS — Z885 Allergy status to narcotic agent status: Secondary | ICD-10-CM

## 2017-11-04 DIAGNOSIS — Z86718 Personal history of other venous thrombosis and embolism: Secondary | ICD-10-CM

## 2017-11-04 DIAGNOSIS — Z886 Allergy status to analgesic agent status: Secondary | ICD-10-CM

## 2017-11-04 DIAGNOSIS — J988 Other specified respiratory disorders: Secondary | ICD-10-CM

## 2017-11-04 DIAGNOSIS — G629 Polyneuropathy, unspecified: Secondary | ICD-10-CM

## 2017-11-04 DIAGNOSIS — Z95828 Presence of other vascular implants and grafts: Secondary | ICD-10-CM

## 2017-11-04 DIAGNOSIS — I82503 Chronic embolism and thrombosis of unspecified deep veins of lower extremity, bilateral: Secondary | ICD-10-CM

## 2017-11-04 DIAGNOSIS — M109 Gout, unspecified: Secondary | ICD-10-CM

## 2017-11-04 DIAGNOSIS — Z888 Allergy status to other drugs, medicaments and biological substances status: Secondary | ICD-10-CM

## 2017-11-04 DIAGNOSIS — I82401 Acute embolism and thrombosis of unspecified deep veins of right lower extremity: Secondary | ICD-10-CM

## 2017-11-04 MED ORDER — RIVAROXABAN 20 MG PO TABS: 20.0000 mg | ORAL_TABLET | Freq: Every day | ORAL | 1 refills | Status: DC

## 2017-11-04 NOTE — Patient Instructions (Signed)
Continue Xarelto 20 mg daily Return visit 6 months Lab 1 week before visit

## 2017-11-04 NOTE — Progress Notes (Signed)
Hematology and Oncology Follow Up Visit  [name] [name] [id number] 01-06-1951 67 y.o. 11/04/2017 3:49 PM   Principle Diagnosis: Encounter Diagnoses  Name Primary?  . Other chronic pulmonary embolism without acute cor pulmonale (HCC) Yes  . 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 Chronic anticoagulation   . Long term current use of anticoagulant therapy   . Acute deep vein thrombosis (DVT) of right lower extremity, unspecified vein (HCC)   Clinical summary: 67 year old man referred in August of 2011 for advice on long-term anticoagulation. He has a complex clotting history. He had bilateral DVTs and bilateral pulmonary emboli in 1990. Not documented in our hospital records. 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 He had an additional right lower extremity DVT in 2010 about one month after a cholecystectomy. He developed a right lower extremity superficial thrombosis when he was off Coumadin just seven days back in July of 2012 in anticipation of hernia surgery. A hypercoagulation profile revealed none of the common abnormalities associated with clotting. However, in view of his clinical history, I advised long-term Coumadin anticoagulation. We have been managing his anticoagulation in our office. 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 He has had wide fluctuations in his INR levels. He has had multiple visits to the emergency department for acute flareup of right calf pain. Venous Doppler study done on 02/25/2016 showed a new, acute, DVT in the right posterior tibial veins. Chronic thrombus noted in the right common femoral vein. Superficial thrombosis noted in varicosities in the greater saphenous vein at the level of the mid right calf. This event occurred at a time when his Coumadin was subtherapeutic. He was bridged with Lovenox and his Coumadin dose was adjusted. He  had another study done on 06/03/2016 to evaluate recurrent right calf pain. No new acute abnormalities noted. Chronic appearing changes noted in the left femoral vein and left posterior tibial veins. Chronic changes on the right. Due to wide fluctuations in his INRs, I elected to stop Coumadin and start him on Xarelto beginning on 06/21/2016.  He has chronic spine problems. He has a implanted TENS unit. He developed progressive peripheral neuropathy. He has burning dysesthesias of his feet radiating up his legs. At times he can't feel his feet on the gas pedal. He has been tried on a number of different medications including Lyrica and Neurontin but doesn't tolerate these medications do to what he believes is significant swelling when he takes them. He has been evaluated by Dr. Lonni Fix from neurology. One doctor told him he thought he had gout and put him on allopurinol which also caused swelling and he stopped it. His only joint symptom is in a right shoulder joint where he had previous rotator cuff surgery and he was subsequently told he has bony spurs.  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  Interim History:   He has the flu right now and has had a hacking cough and subjective fevers.  Everybody in the house also had similar symptoms.  No pain or dyspnea that could be attributed to recurrent thrombosis.  Chronic swelling of his right lower extremity. No bleeding issues on Xarelto and he denies epistaxis, gum bleeding, hematuria, hematochezia or melena. He is having a number of family issues.  His wife died unexpectedly 3 years ago.  1 of his daughters has MS and is living in the [place] area and had to be put in a nursing home this week.  Another daughter age 60 living in [place] is causing him a lot of grief with her erratic  behavior. 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  Medications: reviewed  Allergies:  Allergies  Allergen Reactions  . Aspirin Other (See Comments)    ulcers  . Celecoxib Other (See Comments)    Stomach irritation  . Ibuprofen Other (See Comments)    ulcers  . Lyrica [Pregabalin] Swelling  . Vicodin [Hydrocodone-Acetaminophen]     Makes me mean     Review of Systems: See interim history Remaining ROS negative:   Physical Exam: Blood pressure (!) 143/86, pulse 90, temperature 98.7 F (37.1 C), temperature source Oral, height 6\' 4"  (1.93 m), weight 263 lb (119.3 kg), SpO2 100 %. Wt Readings from Last 3 Encounters:  11/04/17 263 lb (119.3 kg)  09/05/17 257 lb (116.6 kg)  06/30/17 260 lb (117.9 kg)     General appearance: Tall well-nourished African-American man [name]: Pharynx no erythema, exudate, mass, or ulcer. No thyromegaly or thyroid nodules Lymph nodes: No cervical, supraclavicular, or axillary lymphadenopathy Breasts:  Lungs: Clear to auscultation, resonant to percussion throughout Heart: Regular rhythm, no murmur, no gallop, no rub, no click, no edema Abdomen: Soft, nontender, normal bowel sounds, no mass, no organomegaly Extremities: No edema, no calf tenderness.  Venous stasis changes lower extremities bilaterally right greater than left. Musculoskeletal: no joint deformities Calf measurements: 41 cm on the right, 37 cm on the left GU:  Vascular: Carotid pulses 2+, no bruits,  Neurologic: Alert, oriented, PERRLA, I was unable to visualize the optic discs  cranial nerves grossly normal, motor strength 5 over 5, reflexes 1+ symmetric at the biceps, absent symmetric at the knees, upper body coordination normal, gait normal, Skin: No rash or ecchymosis  Lab Results: CBC W/Diff    Component Value Date/Time   WBC 4.3 06/17/2017 1056   WBC 4.5 04/02/2016 1004   RBC 5.94 (H) 06/17/2017 1056   RBC 5.76 04/02/2016 1004   HGB 14.2  06/17/2017 1056   HGB 13.1 04/04/2014 1110   HCT 43.3 06/17/2017 1056   HCT 41.7 04/04/2014 1110   PLT 254 06/17/2017 1056   MCV 73 (L) 06/17/2017 1056   MCV 72.4 (L) 04/04/2014 1110   MCH 23.9 (L) 06/17/2017 1056   MCH 22.9 (L) 04/02/2016 1004   MCHC 32.8 06/17/2017 1056   MCHC 32.6 04/02/2016 1004   RDW 15.7 (H) 06/17/2017 1056   RDW 16.5 (H) 04/04/2014 1110   LYMPHSABS 1.6 06/17/2017 1056   LYMPHSABS 1.5 04/04/2014 1110   MONOABS 0.5 04/02/2016 1004   MONOABS 0.5 04/04/2014 1110   EOSABS 0.1 06/17/2017 1056   BASOSABS 0.0 06/17/2017 1056   BASOSABS 0.1 04/04/2014 1110     Chemistry      Component Value Date/Time   NA 146 (H) 03/17/2017 1134   NA 143 04/04/2014 1116   K 4.1 03/17/2017 1134   K 3.7 04/04/2014 1116   CL 103 03/17/2017 1134   CO2 28 03/17/2017 1134   CO2 30 (H) 04/04/2014 1116   BUN 11 03/17/2017 1134   BUN 10.1 04/04/2014 1116   CREATININE 0.97 03/17/2017 1134   CREATININE 1.0 04/04/2014 1116      Component Value Date/Time   CALCIUM 9.3 03/17/2017 1134  CALCIUM 9.3 04/04/2014 1116   ALKPHOS 50 03/17/2017 1134   ALKPHOS 45 04/04/2014 1116   AST 19 03/17/2017 1134   AST 14 04/04/2014 1116   ALT 14 03/17/2017 1134   ALT 10 04/04/2014 1116   BILITOT 0.3 03/17/2017 1134   BILITOT 0.48 04/04/2014 1116       Radiological Studies: No results found.  Impression:  1.  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  Status post caval filter placement.  He remains on chronic anticoagulation now with Xarelto which he will continue indefinitely.  2.  Chronic atypical chest pain likely sequela of previous herpes zoster infection  3.  Advanced degenerative arthritis with chronic back pain and distal lower extremity neuropathy  4.  Chronic left shoulder pain despite rotator cuff repair.  5.  Chronic microcytic anemia.  He always responds to iron replacement. Most recent CBC CBC with hemoglobin up from 12 g in July 2018 to 14 g on June 17, 2017.   Ferritin 56.  6.  Essential hypertension  7.  Obstructive airway disease  8.  Gout  CC: Patient Care Team: Elwyn Reach, MD as PCP - General (Internal Medicine)   Murriel Hopper, MD, [place]  Hematology-Oncology/Internal Medicine     2/26/20193:49 PM

## 2017-11-12 ENCOUNTER — Other Ambulatory Visit: Payer: Self-pay | Admitting: Oncology

## 2017-11-12 DIAGNOSIS — Z7901 Long term (current) use of anticoagulants: Secondary | ICD-10-CM

## 2017-11-12 DIAGNOSIS — I82401 Acute embolism and thrombosis of unspecified deep veins of right lower extremity: Secondary | ICD-10-CM

## 2018-02-03 ENCOUNTER — Encounter (HOSPITAL_COMMUNITY): Payer: Self-pay | Admitting: Emergency Medicine

## 2018-02-03 ENCOUNTER — Ambulatory Visit (INDEPENDENT_AMBULATORY_CARE_PROVIDER_SITE_OTHER): Payer: Medicare Other

## 2018-02-03 ENCOUNTER — Ambulatory Visit (HOSPITAL_COMMUNITY)
Admission: EM | Admit: 2018-02-03 | Discharge: 2018-02-03 | Disposition: A | Discharge status: Home / Self Care | Payer: Medicare Other | Attending: Internal Medicine | Admitting: Internal Medicine

## 2018-02-03 DIAGNOSIS — R05 Cough: Secondary | ICD-10-CM

## 2018-02-03 DIAGNOSIS — J019 Acute sinusitis, unspecified: Secondary | ICD-10-CM | POA: Diagnosis not present

## 2018-02-03 DIAGNOSIS — K0889 Other specified disorders of teeth and supporting structures: Secondary | ICD-10-CM | POA: Diagnosis not present

## 2018-02-03 DIAGNOSIS — R0781 Pleurodynia: Secondary | ICD-10-CM | POA: Diagnosis not present

## 2018-02-03 MED ORDER — IPRATROPIUM-ALBUTEROL 0.5-2.5 (3) MG/3ML IN SOLN
RESPIRATORY_TRACT | Status: AC
  Filled 2018-02-03: qty 3

## 2018-02-03 MED ORDER — PREDNISONE 50 MG PO TABS: 50.0000 mg | ORAL_TABLET | Freq: Every day | ORAL | 0 refills | Status: DC

## 2018-02-03 MED ORDER — IPRATROPIUM-ALBUTEROL 0.5-2.5 (3) MG/3ML IN SOLN
3.0000 mL | Freq: Once | RESPIRATORY_TRACT | Status: AC
  Administered 2018-02-03: 3 mL via RESPIRATORY_TRACT

## 2018-02-03 MED ORDER — AMOXICILLIN-POT CLAVULANATE 875-125 MG PO TABS: 1.0000 | ORAL_TABLET | Freq: Two times a day (BID) | ORAL | 0 refills | Status: AC

## 2018-02-03 NOTE — ED Triage Notes (Signed)
PT reports productive cough for over 1 week. PT reports generalized body aches, back pain, and blurred vision as well. PT reports blurred vision has been present for a week. PT also reports a headache. PT is hypertensive in triage. PT has a "bad tooth" as well.   PT was treated for a sinus infection 2 months ago that never got better.

## 2018-02-03 NOTE — Discharge Instructions (Addendum)
Anticipate gradual improvement in cough and pain with coughing over the next several days.  Cough may take a couple weeks to subside.  Chest xray was negative for pneumonia.  Prescriptions for amoxicillin/clavulanate (antibiotic) and prednisone (steroid, for cough/wheeze) were sent to the pharmacy.  Check in with primary care provider to discuss next steps for managing painful things.

## 2018-02-03 NOTE — ED Provider Notes (Signed)
[facility]    CSN: [id number] Arrival date & time: 02/03/18  1308     History   Chief Complaint Chief Complaint  Patient presents with  . Cough  . Generalized Body Aches    HPI [name] [name] is a 67 y.o. male.   He presents with more than a week's worth of productive cough, now with pleuritic chest/side/back discomfort.  Felt feverish in the last couple of days.  Also has a bad tooth in the right upper jaw, and persistent sinus discharge.   HPI  Past Medical History:  Diagnosis Date  . Anxiety and depression   . Back pain   . Diverticulitis   . Diverticulosis   . DVT (deep venous thrombosis) ([place])   . Gout   . Hernia   . History of blood clots   . Hypertension   . Nausea and vomiting   . Neuropathy, peripheral 05/15/2012  . Shingles     Patient Active Problem List   Diagnosis Date Noted  . Primary osteoarthritis of right knee 07/08/2017  . Complete rotator cuff tear of left shoulder 10/21/2016  . Gout attack 04/02/2016  . Angina pectoris ([place]) 08/01/2014  . Hemorrhage of rectum and anus 03/28/2014  . Neuropathy, peripheral 05/15/2012  . Pulmonary embolism ([place]) 07/11/2011  . DVT (deep venous thrombosis) ([place]) 07/11/2011  . Abdominal pain, left lower quadrant 01/29/2011  . Long term current use of anticoagulant therapy 01/29/2011  . Diarrhea 01/29/2011    Past Surgical History:  Procedure Laterality Date  . APPENDECTOMY    . BACK SURGERY    . CHOLECYSTECTOMY  2008  . HERNIA REPAIR    . HIATAL HERNIA REPAIR    . LEFT HEART CATHETERIZATION WITH CORONARY ANGIOGRAM N/A 08/02/2014   Procedure: LEFT HEART CATHETERIZATION WITH CORONARY ANGIOGRAM;  Surgeon: Laverda Page, MD;  Location: [facility] CATH LAB;  Service: Cardiovascular;  Laterality: N/A;  . MULTIPLE EXTRACTIONS WITH ALVEOLOPLASTY N/A 10/19/2015   Procedure: Extraction of tooth #'s 2,12,13 with alveoloplasty;  Surgeon: Lenn Cal, DDS;  Location: [facility];  Service: Oral Surgery;   Laterality: N/A;  . SACRAL NERVE STIMULATOR PLACEMENT  2011       Home Medications    Prior to Admission medications   Medication Sig Start Date End Date Taking? Authorizing Provider  albuterol (PROVENTIL HFA;VENTOLIN HFA) 108 (90 Base) MCG/ACT inhaler Inhale 2 puffs into the lungs every 6 (six) hours as needed for wheezing or shortness of breath. 01/27/17   Varney Biles, MD  amoxicillin-clavulanate (AUGMENTIN) 875-125 MG tablet Take 1 tablet by mouth 2 (two) times daily for 10 days. 02/03/18 02/13/18  Wynona Luna, MD  colchicine 0.6 MG tablet Take 1 tablet (0.6 mg total) by mouth 2 (two) times daily. Take 2 tablets for first dose then 1 pill twice daily for gout attack 04/02/16 06/30/17  [name], MD  cyclobenzaprine (FLEXERIL) 10 MG tablet Take 10 mg by mouth 3 (three) times daily as needed for muscle spasms.  02/15/16   [provider]  dextromethorphan-guaiFENesin (MUCINEX DM) 30-600 MG 12hr tablet Take 1 tablet by mouth 2 (two) times daily. 07/30/17   Shary Decamp, PA-C  Diclofenac Sodium (PENNSAID) 2 % SOLN Place 2 Squirts onto the skin 2 (two) times daily. 10/21/16   Meredith Pel, MD  doxycycline (VIBRAMYCIN) 100 MG capsule Take 1 capsule (100 mg total) by mouth 2 (two) times daily. 01/27/17   Varney Biles, MD  DULoxetine (CYMBALTA) 60 MG capsule Take  120 mg by mouth daily.     [provider]  enoxaparin (LOVENOX) 120 MG/0.8ML injection Inject 0.8 mLs (120 mg total) into the skin every 12 (twelve) hours. 02/25/16   [name], MD  metoprolol tartrate (LOPRESSOR) 25 MG tablet Take 25 mg by mouth 2 (two) times daily. 07/29/14   [provider]  NIFEDICAL XL 60 MG 24 hr tablet Take 120 mg by mouth daily.  02/21/14   [provider]  ondansetron (ZOFRAN) 4 MG tablet Take 1 tablet (4 mg total) by mouth every 8 (eight) hours as needed for nausea or vomiting. 03/17/15   [name], MD  oxyCODONE-acetaminophen  (PERCOCET/ROXICET) 5-325 MG tablet Take 1 tablet by mouth every 6 (six) hours as needed for severe pain. 06/30/17   Blanchie Dessert, MD  predniSONE (DELTASONE) 50 MG tablet Take 1 tablet (50 mg total) by mouth daily. 02/03/18   Wynona Luna, MD  PROAIR HFA 108 [phone] BASE) MCG/ACT inhaler Inhale 1 puff into the lungs every 6 (six) hours as needed for wheezing.  03/23/14   [provider]  rivaroxaban (XARELTO) 20 MG TABS tablet Take 1 tablet (20 mg total) by mouth daily with supper. 11/04/17   [name], MD  traMADol (ULTRAM) 50 MG tablet Take 1 tablet (50 mg total) by mouth every 6 (six) hours as needed. 09/05/17   Zigmund Gottron, NP  valsartan-hydrochlorothiazide (DIOVAN-HCT) 320-25 MG per tablet Take 1 tablet by mouth daily.    [provider]  VOLTAREN 1 % GEL Apply 2 g topically daily as needed (pain). For neck and shoulder pain 02/23/14   [provider]  isosorbide mononitrate (IMDUR) 60 MG 24 hr tablet Take 60 mg by mouth daily.  11/28/11  [provider]    Family History Family History  Problem Relation Age of Onset  . Prostate cancer Father   . Heart disease Father   . Diabetes Mother   . Heart disease Mother   . Colon cancer Maternal Uncle        dx in his 62's  . Pancreatic cancer Paternal Uncle   . Prostate cancer Paternal Uncle   . Multiple sclerosis Daughter   . Prostate cancer Paternal Uncle     Social History Social History   Tobacco Use  . Smoking status: Current Every Day Smoker    Packs/day: 0.50    Years: 40.00    Pack years: 20.00    Types: Cigarettes  . Smokeless tobacco: Never Used  . Tobacco comment: sometimes less.  Substance Use Topics  . Alcohol use: Yes    Alcohol/week: 0.0 oz    Comment: Occasional  . Drug use: No    Types: Marijuana     Allergies   Aspirin; Celecoxib; Ibuprofen; Lyrica [pregabalin]; and Vicodin [hydrocodone-acetaminophen]   Review of Systems Review of Systems  All  other systems reviewed and are negative.    Physical Exam Triage Vital Signs ED Triage Vitals  Enc Vitals Group     BP 02/03/18 1335 (!) 151/105     Pulse Rate 02/03/18 1335 91     Resp 02/03/18 1335 (!) 22     Temp 02/03/18 1335 99.4 F (37.4 C)     Temp Source 02/03/18 1335 Oral     SpO2 02/03/18 1335 100 %     Weight 02/03/18 1338 262 lb (118.8 kg)     Height --      Pain Score 02/03/18 1338 8  Pain Loc --    Updated Vital Signs BP (!) 151/105   Pulse 91   Temp 99.4 F (37.4 C) (Oral)   Resp (!) 22   Wt 262 lb (118.8 kg)   SpO2 100%   BMI 31.89 kg/m  Physical Exam  Constitutional: He is oriented to person, place, and time.  Alert, nicely groomed Reports feeling terrible  HENT:  Head: Atraumatic.  Bilateral TMs are dull, no erythema Moderate nasal congestion bilaterally Throat is injected with postnasal discharge evident  Eyes:  Conjugate gaze, no eye redness/drainage  Neck: Neck supple.  Cardiovascular: Normal rate and regular rhythm.  Pulmonary/Chest: No respiratory distress. He has no rales.  Rare scattered wheeze, good air excursion Posteriorly, left upper lobe area is slightly diminished  Abdominal: He exhibits no distension.  Musculoskeletal: Normal range of motion.  Neurological: He is alert and oriented to person, place, and time.  Skin: Skin is warm and dry.  No cyanosis  Nursing note and vitals reviewed.    UC Treatments / Results  Labs (all labs ordered are listed, but only abnormal results are displayed) Labs Reviewed - No data to display  EKG None  Radiology Dg Chest 2 View  Result Date: 02/03/2018 CLINICAL DATA:  Cough for 1 month EXAM: CHEST - 2 VIEW COMPARISON:  07/30/2017 FINDINGS: The heart size and mediastinal contours are within normal limits. Both lungs are clear. The visualized skeletal structures are unremarkable. Spinal stimulator is noted. IMPRESSION: No active cardiopulmonary disease. Electronically Signed   By: Kathreen Devoid   On: 02/03/2018 15:29    Procedures Procedures (including critical care time)  Medications Ordered in UC Medications  ipratropium-albuterol (DUONEB) 0.5-2.5 (3) MG/3ML nebulizer solution 3 mL (3 mLs Nebulization Given 02/03/18 1529)    Final Clinical Impressions(s) / UC Diagnoses   Final diagnoses:  Acute sinusitis with symptoms > 10 days  Pain, dental  Pleuritic chest pain     Discharge Instructions     Anticipate gradual improvement in cough and pain with coughing over the next several days.  Cough may take a couple weeks to subside.  Chest xray was negative for pneumonia.  Prescriptions for amoxicillin/clavulanate (antibiotic) and prednisone (steroid, for cough/wheeze) were sent to the pharmacy.  Check in with primary care provider to discuss next steps for managing painful things.     ED Prescriptions    Medication Sig Dispense Auth. Provider   amoxicillin-clavulanate (AUGMENTIN) 875-125 MG tablet Take 1 tablet by mouth 2 (two) times daily for 10 days. 20 tablet Wynona Luna, MD   predniSONE (DELTASONE) 50 MG tablet Take 1 tablet (50 mg total) by mouth daily. 3 tablet Wynona Luna, MD       Wynona Luna, MD 02/07/18 2221

## 2018-03-28 ENCOUNTER — Emergency Department (HOSPITAL_COMMUNITY): Payer: Medicare Other

## 2018-03-28 ENCOUNTER — Emergency Department (HOSPITAL_COMMUNITY)
Admission: EM | Admit: 2018-03-28 | Discharge: 2018-03-28 | Disposition: A | Discharge status: Home / Self Care | Payer: Medicare Other | Attending: Emergency Medicine | Admitting: Emergency Medicine

## 2018-03-28 ENCOUNTER — Other Ambulatory Visit: Payer: Self-pay

## 2018-03-28 ENCOUNTER — Encounter (HOSPITAL_COMMUNITY): Payer: Self-pay | Admitting: Emergency Medicine

## 2018-03-28 DIAGNOSIS — F17210 Nicotine dependence, cigarettes, uncomplicated: Secondary | ICD-10-CM | POA: Diagnosis not present

## 2018-03-28 DIAGNOSIS — R05 Cough: Secondary | ICD-10-CM | POA: Diagnosis not present

## 2018-03-28 DIAGNOSIS — R062 Wheezing: Secondary | ICD-10-CM | POA: Diagnosis not present

## 2018-03-28 DIAGNOSIS — R0789 Other chest pain: Secondary | ICD-10-CM | POA: Diagnosis not present

## 2018-03-28 DIAGNOSIS — Z86711 Personal history of pulmonary embolism: Secondary | ICD-10-CM | POA: Insufficient documentation

## 2018-03-28 DIAGNOSIS — I10 Essential (primary) hypertension: Secondary | ICD-10-CM | POA: Diagnosis not present

## 2018-03-28 DIAGNOSIS — Z79899 Other long term (current) drug therapy: Secondary | ICD-10-CM | POA: Insufficient documentation

## 2018-03-28 DIAGNOSIS — Z7901 Long term (current) use of anticoagulants: Secondary | ICD-10-CM | POA: Diagnosis not present

## 2018-03-28 LAB — BASIC METABOLIC PANEL
Anion gap: 10 (ref 5–15)
BUN: 7 mg/dL — ABNORMAL LOW (ref 8–23)
CO2: 28 mmol/L (ref 22–32)
Calcium: 9.6 mg/dL (ref 8.9–10.3)
Chloride: 103 mmol/L (ref 98–111)
Creatinine, Ser: 0.91 mg/dL (ref 0.61–1.24)
GFR calc Af Amer: >60 mL/min (ref >60)
GFR calc non Af Amer: >60 mL/min (ref >60)
Glucose, Bld: 96 mg/dL (ref 70–99)
Potassium: 3.6 mmol/L (ref 3.5–5.1)
Sodium: 141 mmol/L (ref 135–145)

## 2018-03-28 LAB — I-STAT TROPONIN, ED
Troponin i, poc: 0 ng/mL (ref 0.00–0.08)
Troponin i, poc: 0 ng/mL (ref 0.00–0.08)

## 2018-03-28 LAB — CBC
HCT: 42.8 % (ref 39.0–52.0)
Hemoglobin: 13.7 g/dL (ref 13.0–17.0)
MCH: 23 pg — ABNORMAL LOW (ref 26.0–34.0)
MCHC: 32 g/dL (ref 30.0–36.0)
MCV: 71.9 fL — ABNORMAL LOW (ref 78.0–100.0)
Platelets: 249 10*3/uL (ref 150–400)
RBC: 5.95 MIL/uL — ABNORMAL HIGH (ref 4.22–5.81)
RDW: 15.8 % — ABNORMAL HIGH (ref 11.5–15.5)
WBC: 4 10*3/uL (ref 4.0–10.5)

## 2018-03-28 MED ORDER — VALACYCLOVIR HCL 500 MG PO TABS
1000.0000 mg | ORAL_TABLET | Freq: Once | ORAL | Status: AC
  Administered 2018-03-28: 1000 mg via ORAL
  Filled 2018-03-28: qty 2

## 2018-03-28 MED ORDER — AEROCHAMBER PLUS FLO-VU MEDIUM MISC
1.0000 | Freq: Once | Status: AC
  Administered 2018-03-28: 1
  Filled 2018-03-28: qty 1

## 2018-03-28 MED ORDER — DOXYCYCLINE HYCLATE 100 MG PO CAPS: 100.0000 mg | ORAL_CAPSULE | Freq: Two times a day (BID) | ORAL | 0 refills | Status: AC

## 2018-03-28 MED ORDER — MORPHINE SULFATE (PF) 4 MG/ML IV SOLN
6.0000 mg | Freq: Once | INTRAVENOUS | Status: AC
  Administered 2018-03-28: 6 mg via INTRAVENOUS
  Filled 2018-03-28: qty 2

## 2018-03-28 MED ORDER — HYDROMORPHONE HCL 1 MG/ML IJ SOLN
1.0000 mg | Freq: Once | INTRAMUSCULAR | Status: AC
Start: 2018-03-28 — End: 2018-03-28
  Administered 2018-03-28: 1 mg via INTRAVENOUS
  Filled 2018-03-28: qty 1

## 2018-03-28 MED ORDER — PREDNISONE 20 MG PO TABS: 40.0000 mg | ORAL_TABLET | Freq: Once | ORAL | Status: DC

## 2018-03-28 MED ORDER — PREDNISONE 10 MG PO TABS: ORAL_TABLET | ORAL | 0 refills | Status: AC

## 2018-03-28 MED ORDER — IPRATROPIUM-ALBUTEROL 0.5-2.5 (3) MG/3ML IN SOLN
3.0000 mL | Freq: Once | RESPIRATORY_TRACT | Status: AC
  Administered 2018-03-28: 3 mL via RESPIRATORY_TRACT
  Filled 2018-03-28: qty 3

## 2018-03-28 MED ORDER — OXYCODONE HCL 5 MG PO TABS: 5.0000 mg | ORAL_TABLET | Freq: Four times a day (QID) | ORAL | 0 refills | Status: DC | PRN

## 2018-03-28 MED ORDER — DOXYCYCLINE HYCLATE 100 MG PO TABS
100.0000 mg | ORAL_TABLET | Freq: Once | ORAL | Status: AC
  Administered 2018-03-28: 100 mg via ORAL
  Filled 2018-03-28: qty 1

## 2018-03-28 MED ORDER — VALACYCLOVIR HCL 1 G PO TABS: 1000.0000 mg | ORAL_TABLET | Freq: Three times a day (TID) | ORAL | 0 refills | Status: AC

## 2018-03-28 MED ORDER — PREDNISONE 20 MG PO TABS
60.0000 mg | ORAL_TABLET | Freq: Once | ORAL | Status: AC
  Administered 2018-03-28: 60 mg via ORAL
  Filled 2018-03-28: qty 3

## 2018-03-28 MED ORDER — ALBUTEROL SULFATE HFA 108 (90 BASE) MCG/ACT IN AERS: 2.0000 | INHALATION_SPRAY | Freq: Four times a day (QID) | RESPIRATORY_TRACT | Status: DC

## 2018-03-28 NOTE — ED Notes (Signed)
Pt stable, ambulatory, and verbalizes understanding of d/c instructions.  

## 2018-03-28 NOTE — Discharge Instructions (Signed)
Please see the information and instructions below regarding your visit.  Your diagnoses today include:  1. Left-sided chest wall pain   2. Wheezing   3. Elevated blood pressure reading with diagnosis of hypertension    Your examination is suggestive of either chest wall pain versus herpes zoster infection that is not showing a rash.  Your lab work and particularly heart testing was normal today.  Tests performed today include: See side panel of your discharge paperwork for testing performed today. Vital signs are listed at the bottom of these instructions.   Medications prescribed:    Take any prescribed medications only as prescribed, and any over the counter medications only as directed on the packaging.  You are prescribed prednisone, a steroid. This is a medication to help reduce inflammation in the lungs.  You will take an 11-day taper of this medication.  Common side effects include upset stomach/nausea. You may take this medicine with food if this occurs. Other side effects include restlessness, difficulty sleeping, and increased sweating. Call your healthcare provider if these do not resolve after finishing the medication.  This medicine may increase your blood sugar so additional careful monitoring is needed of blood sugar if you have diabetes. Call your healthcare provider for any signs/symtpoms of high blood sugar such as confusion, feeling sleepy, more thirst, more hunger, passing urine more often, flushing, fast breathing, or breath that smells like fruit.  You have been prescribed oxycodone for pain.  You will take 5 mg on top of your typical dose of oxycodone for severe pain over the next couple of days.  This is just oxycodone without acetaminophen.  This is an opioid pain medication. You may take this medication every 4-6 hours as needed for pain. Only take this medication if you need it for breakthrough pain.   Do not combine this medication with Tylenol, as it may increase  the risk of liver problems.  Do not combine this medication with alcohol.  Please be advised to avoid driving or operating heavy machinery while taking this medication, as it may make you drowsy or impair judgment.   You are prescribed Valtrex, which is an antiviral that she will take it 3 times a day for a week.  Please schedule your inhaler treatments every 6 hours for the next 5 days.  Home care instructions:  Please follow any educational materials contained in this packet.   Follow-up instructions: Please follow-up with your primary care provider in 5-7 days for further evaluation of your symptoms if they are not completely improved.  I would also like you to help with your primary care provider to discuss the sensation of left breast enlargement to obtain further imaging.  I will send your primary care provider note in the chart.  Return instructions:  Please return to the Emergency Department if you experience worsening symptoms.  Please return to the emergency department for any worsening chest pain, shortness of breath, dizziness or lightheadedness refill you are going to pass out. Please return if you have any other emergent concerns.  Additional Information:   Your vital signs today were: BP (!) 142/82    Pulse 74    Temp 98.2 F (36.8 C) (Oral)    Resp 10    SpO2 94%  If your blood pressure (BP) was elevated on multiple readings during this visit above 130 for the top number or above 80 for the bottom number, please have this repeated by your primary care provider within one month. --------------  Thank you for allowing [place] to participate in your care today.

## 2018-03-28 NOTE — ED Provider Notes (Signed)
Medical screening examination/treatment/procedure(s) were conducted as a shared visit with non-physician practitioner(s) and myself.  I personally evaluated the patient during the encounter.  Patient here with left-sided chest pain.  Has had intermittent cough with as well.  States it feels similar to previous episodes of shingles but is much more intense. My exam patient has significant tenderness to palpation in his left breast.  He feels like it swollen but is not obviously noticeable however his nipple is at a different angle which could be related to swelling or position.  His lungs have significant diminished breath sounds and end expiratory wheezing.  Heart is normal. Could be bronchitis with a costochondritis or muscle tear.  Could be early shingles without a rash however it is less likely at this time.  Could be a cellulitis he does have the tenderness over his breast with possible swelling. Suggest treatment with Doxy, steroids, breathing treatments for most likely bronchitis would also cover for cellulitis and shingles.  To be followed.  Low suspicion for ACS or PE with clinical presentation.  Please see my separate note for my focused history, physical, medical decision making and applicable procedures for this shared patient encounter.    EKG Interpretation  Date/Time:  Saturday March 28 2018 11:04:12 EDT Ventricular Rate:  89 PR Interval:  140 QRS Duration: 100 QT Interval:  370 QTC Calculation: 450 R Axis:   -62 Text Interpretation:  Normal sinus rhythm Left anterior fascicular block Abnormal ECG No significant change since last tracing Confirmed by Merrily Pew [phone]) on 03/28/2018 3:20:09 PM          Lorea Kupfer, Corene Cornea, MD 03/29/18 1542

## 2018-03-28 NOTE — ED Triage Notes (Addendum)
Pt reports medial CP radiating from sternum to L axillary worsening since Wednesday, denies SOB, endorses nausea.  Pt reports hx of "shingles under the skin," reports has had shingles intermittently since last year.  L breast swollen.  Pt reports pain worse with deep inhalation, reports having a filter d/t hx blood clots.

## 2018-03-28 NOTE — ED Provider Notes (Signed)
Care assumed from [place], [place], at about 1700. Please see their note for H&P. Briefly, this is a 67 year old male presenting for atypical left-sided chest pain felt to be chest wall pain. Also with wheezing. He is being given valacyclovir as well asbronchodilators, steroids, and doxycycline. Plan to reassess and discharge after initial medications.  1900: Patient reassessed at bedside. Resting comfortably and requesting discharge. HPI briefly reviewed. EKG reviewed. Stable for discharge. Return precautions and follow up plan discussed.   Prescilla Sours, MD 03/28/18 Shelbie Hutching, MD 03/29/18 [phone]

## 2018-03-28 NOTE — ED Notes (Signed)
Got patient undress on the monitor did ekg shown to er doctor patient is resting with call bell in reach 

## 2018-03-28 NOTE — ED Notes (Signed)
Pt is a current smoker- 0.5 pack a day

## 2018-03-28 NOTE — ED Provider Notes (Signed)
[facility] EMERGENCY DEPARTMENT Provider Note   CSN: [id number] Arrival date & time: 03/28/18  1057     History   Chief Complaint Chief Complaint  Patient presents with  . Chest Pain    HPI [name] [name] is a 67 y.o. male.  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  Patient reports is been present for approximately 3 to 4 days.  Patient describes that it vaguely feels like his prior episodes of shingles, which he has "gotten shingles without a rash".  Patient reports it is more severe this time.  Patient reports any movement or touching of the left chest wall will cause severe pain.  Patient denies the pain is exertional, radiating to the back, jaw, or down the left arm.  Patient reports that he feels his left breast "swelling".  Patient denies any shortness of breath.  Patient denies syncope or presyncope.  Patient denies any history of ischemic  chest pain.  No fevers or chills.  Patient does report that he has had an increased cough of the past week.  Patient denies history of COPD.  Patient reports that his home pain regimen of oxycodone-acetaminophen is not improving his pain.  No family history of MI.  Patient has extensive history of DVT/PE, however has 2 filters in place, one IVC, and one in pulmonary arteries, and is also on Xarelto, which he denies missing any doses of.  Past Medical History:  Diagnosis Date  . Anxiety and depression   . Back pain   . Diverticulitis   . Diverticulosis   . DVT (deep venous thrombosis) ([place])   . Gout   . Hernia   . History of blood clots   . Hypertension   . Nausea and vomiting   . Neuropathy, peripheral 05/15/2012  . Shingles     Patient Active Problem List   Diagnosis Date Noted  . Primary osteoarthritis of right knee 07/08/2017  . Complete rotator cuff tear of left  shoulder 10/21/2016  . Gout attack 04/02/2016  . Angina pectoris ([place]) 08/01/2014  . Hemorrhage of rectum and anus 03/28/2014  . Neuropathy, peripheral 05/15/2012  . Pulmonary embolism ([place]) 07/11/2011  . DVT (deep venous thrombosis) ([place]) 07/11/2011  . Abdominal pain, left lower quadrant 01/29/2011  . Long term current use of anticoagulant therapy 01/29/2011  . Diarrhea 01/29/2011    Past Surgical History:  Procedure Laterality Date  . APPENDECTOMY    . BACK SURGERY    . CHOLECYSTECTOMY  2008  . HERNIA REPAIR    . HIATAL HERNIA REPAIR    . LEFT HEART CATHETERIZATION WITH CORONARY ANGIOGRAM N/A 08/02/2014   Procedure: LEFT HEART CATHETERIZATION WITH CORONARY ANGIOGRAM;  Surgeon: Laverda Page, MD;  Location: [facility] CATH LAB;  Service: Cardiovascular;  Laterality: N/A;  . MULTIPLE EXTRACTIONS WITH ALVEOLOPLASTY N/A 10/19/2015   Procedure: Extraction of tooth #'s 2,12,13 with alveoloplasty;  Surgeon: Lenn Cal, DDS;  Location: [facility];  Service: Oral Surgery;  Laterality: N/A;  . SACRAL NERVE STIMULATOR PLACEMENT  2011        Home Medications    Prior to Admission medications   Medication Sig Start Date End Date Taking? Authorizing Provider  albuterol (PROVENTIL HFA;VENTOLIN HFA) 108 (90 Base) MCG/ACT inhaler Inhale 2 puffs into the lungs every 6 (six) hours as needed for wheezing or shortness of  breath. 01/27/17  Yes Nanavati, Ankit, MD  amoxicillin-clavulanate (AUGMENTIN) 500-125 MG tablet Take 500 mg by mouth 2 (two) times daily. 02/28/18  Yes [provider]  cetirizine (ZYRTEC) 10 MG tablet Take 10 mg by mouth as needed. 02/05/18  Yes [provider]  colchicine 0.6 MG tablet Take 1 tablet (0.6 mg total) by mouth 2 (two) times daily. Take 2 tablets for first dose then 1 pill twice daily for gout attack Patient taking differently: Take 0.6 mg by mouth as needed. Take 2 tablets for first dose then 1 pill twice daily for gout attack  04/02/16 03/28/18 Yes  [name], MD  cyclobenzaprine (FLEXERIL) 10 MG tablet Take 10 mg by mouth 3 (three) times daily as needed for muscle spasms.  02/15/16  Yes [provider]  DULoxetine (CYMBALTA) 60 MG capsule Take 120 mg by mouth daily.    Yes [provider]  fluticasone (FLONASE) 50 MCG/ACT nasal spray Place 1 spray into both nostrils 2 (two) times daily. 01/25/18  Yes [provider]  metoprolol tartrate (LOPRESSOR) 25 MG tablet Take 50 mg by mouth 2 (two) times daily.  07/29/14  Yes [provider]  NIFEDICAL XL 60 MG 24 hr tablet Take 120 mg by mouth daily.  02/21/14  Yes [provider]  omeprazole (PRILOSEC) 20 MG capsule Take 20 mg by mouth daily. 02/05/18  Yes [provider]  oxyCODONE-acetaminophen (PERCOCET) 10-325 MG tablet Take 1 tablet by mouth 3 (three) times daily. 03/03/18  Yes [provider]  PROAIR HFA 108 (90 BASE) MCG/ACT inhaler Inhale 1 puff into the lungs every 6 (six) hours as needed for wheezing.  03/23/14  Yes [provider]  rivaroxaban (XARELTO) 20 MG TABS tablet Take 1 tablet (20 mg total) by mouth daily with supper. 11/04/17  Yes [name], MD  valsartan-hydrochlorothiazide (DIOVAN-HCT) 320-25 MG per tablet Take 1 tablet by mouth daily.   Yes [provider]  VOLTAREN 1 % GEL Apply 2 g topically daily as needed (pain). For neck and shoulder pain 02/23/14  Yes [provider]  dextromethorphan-guaiFENesin (MUCINEX DM) 30-600 MG 12hr tablet Take 1 tablet by mouth 2 (two) times daily. Patient not taking: Reported on 03/28/2018 07/30/17   Shary Decamp, PA-C  Diclofenac Sodium (PENNSAID) 2 % SOLN Place 2 Squirts onto the skin 2 (two) times daily. Patient not taking: Reported on 03/28/2018 10/21/16   Meredith Pel, MD  doxycycline (VIBRAMYCIN) 100 MG capsule Take 1 capsule (100 mg total) by mouth 2 (two) times daily. Patient not taking: Reported on 03/28/2018 01/27/17   Varney Biles, MD  enoxaparin (LOVENOX) 120 MG/0.8ML injection Inject 0.8 mLs (120 mg total) into the skin every 12 (twelve) hours. Patient not taking: Reported on 03/28/2018 02/25/16   [name], MD  ondansetron (ZOFRAN) 4 MG tablet Take 1 tablet (4 mg total) by mouth every 8 (eight) hours as needed for nausea or vomiting. Patient not taking: Reported on 03/28/2018 03/17/15   [name], MD  oxyCODONE-acetaminophen (PERCOCET/ROXICET) 5-325 MG tablet Take 1 tablet by mouth every 6 (six) hours as needed for severe pain. Patient not taking: Reported on 03/28/2018 06/30/17   Blanchie Dessert, MD  predniSONE (DELTASONE) 50 MG tablet Take 1 tablet (50 mg total) by mouth daily. Patient not taking: Reported on 03/28/2018 02/03/18   Wynona Luna, MD  traMADol (ULTRAM) 50 MG tablet Take 1 tablet (50 mg total) by mouth every 6 (six) hours as needed. Patient not  taking: Reported on 03/28/2018 09/05/17   Augusto Gamble B, NP  isosorbide mononitrate (IMDUR) 60 MG 24 hr tablet Take 60 mg by mouth daily.  11/28/11  [provider]    Family History Family History  Problem Relation Age of Onset  . Prostate cancer Father   . Heart disease Father   . Diabetes Mother   . Heart disease Mother   . Colon cancer Maternal Uncle        dx in his 71's  . Pancreatic cancer Paternal Uncle   . Prostate cancer Paternal Uncle   . Multiple sclerosis Daughter   . Prostate cancer Paternal Uncle     Social History Social History   Tobacco Use  . Smoking status: Current Every Day Smoker    Packs/day: 0.50    Years: 40.00    Pack years: 20.00    Types: Cigarettes  . Smokeless tobacco: Never Used  . Tobacco comment: sometimes less.  Substance Use Topics  . Alcohol use: Yes    Alcohol/week: 0.0 oz    Comment: Occasional  . Drug use: No    Types: Marijuana     Allergies   Aspirin; Celecoxib; Ibuprofen; Lyrica [pregabalin]; and Vicodin [hydrocodone-acetaminophen]   Review of Systems Review  of Systems  Constitutional: Negative for chills and fever.  HENT: Positive for congestion and rhinorrhea.   Respiratory: Positive for cough and chest tightness. Negative for shortness of breath.   Cardiovascular: Positive for chest pain. Negative for palpitations and leg swelling.  Gastrointestinal: Negative for abdominal distention, nausea and vomiting.  Musculoskeletal: Positive for arthralgias and myalgias.  Skin: Negative for color change and rash.  Neurological: Negative for dizziness, syncope and light-headedness.  All other systems reviewed and are negative.    Physical Exam Updated Vital Signs BP (!) 170/99   Pulse 68   Temp 98.2 F (36.8 C) (Oral)   Resp 10   SpO2 94%   Physical Exam  Constitutional: He appears well-developed and well-nourished. No distress.  HENT:  Head: Normocephalic and atraumatic.  Mouth/Throat: Oropharynx is clear and moist.  Eyes: Pupils are equal, round, and reactive to light. Conjunctivae and EOM are normal.  Neck: Normal range of motion. Neck supple.  Cardiovascular: Normal rate, regular rhythm, S1 normal and S2 normal.  No murmur heard. Pulses:      Radial pulses are 2+ on the right side, and 2+ on the left side.  DP pulses 2+, confirmed on Doppler. Patient is significant chest wall pain to palpation from the left sternal border, beneath the left nipple, and up to the left axilla.  No lumps or masses, or areas of fluctuance palpated on left breast.   Pulmonary/Chest: Effort normal. He has decreased breath sounds in the right lower field and the left lower field. He has wheezes. He has no rales.  Abdominal: Soft. He exhibits no distension. There is no tenderness. There is no guarding.  Musculoskeletal: Normal range of motion. He exhibits no edema or deformity.  Lymphadenopathy:    He has no cervical adenopathy.  Neurological: He is alert.  Cranial nerves grossly intact. Patient moves extremities symmetrically and with good coordination.    Skin: Skin is warm and dry. No rash noted. No erythema.  Psychiatric: He has a normal mood and affect. His behavior is normal. Judgment and thought content normal.  Nursing note and vitals reviewed.    ED Treatments / Results  Labs (all labs ordered are listed, but only abnormal results are displayed) Labs Reviewed  BASIC METABOLIC PANEL - Abnormal; Notable for the following components:      Result Value   BUN 7 (*)    All other components within normal limits  CBC - Abnormal; Notable for the following components:   RBC 5.95 (*)    MCV 71.9 (*)    MCH 23.0 (*)    RDW 15.8 (*)    All other components within normal limits  I-STAT TROPONIN, ED  I-STAT TROPONIN, ED    EKG EKG Interpretation  Date/Time:  Saturday March 28 2018 11:04:12 EDT Ventricular Rate:  89 PR Interval:  140 QRS Duration: 100 QT Interval:  370 QTC Calculation: 450 R Axis:   -62 Text Interpretation:  Normal sinus rhythm Left anterior fascicular block Abnormal ECG No significant change since last tracing Confirmed by Merrily Pew [phone]) on 03/28/2018 3:20:09 PM   Radiology Dg Chest 2 View  Result Date: 03/28/2018 CLINICAL DATA:  Chest pain radiating from the sternum to the left axilla for the past 3 days, worsening. Left breast swelling. Intermittent shingles. EXAM: CHEST - 2 VIEW COMPARISON:  02/03/2018. FINDINGS: Normal sized heart. Tortuous aorta. Clear lungs with normal vascularity. Neural stimulator lead tips in the spinal canal at the upper T9 level. Thoracic spine degenerative changes and right shoulder fixation anchors. Cholecystectomy clips. IMPRESSION: No acute abnormality. Electronically Signed   By: Claudie Revering M.D.   On: 03/28/2018 11:33    Procedures Procedures (including critical care time)  Medications Ordered in ED Medications  morphine 4 MG/ML injection 6 mg (6 mg Intravenous Given 03/28/18 1319)     Initial Impression / Assessment and Plan / ED Course  I have reviewed the triage  vital signs and the nursing notes.  Pertinent labs & imaging results that were available during my care of the patient were reviewed by me and considered in my medical decision making (see chart for details).  Clinical Course as of Mar 28 1741  Sat Mar 28, 2018  1733 I have reviewed the patient's information in the [facility] for the past 12 months and found them to have regular chronic pain regimen of oxycodone-acetaminphen 10-325 #90 every month. Instructed pt to take an additional 5 mg roxicodone on top of typical dose.  Opiates were prescribed for an acute, painful condition. The patient was given information on side effects and encouraged to use other, non-opiate pain medication primary, only using opiate medicine sparingly for severe pain.    [AM]    Clinical Course User Index [AM] Albesa Seen, PA-C    Patient is nontoxic-appearing, afebrile, and in no acute distress.  Differential diagnosis includes ACS, pulmonary embolism, thoracic aortic dissection, herpes zoster of the thorax, chest wall pain, costochondritis, bronchitis, COPD exacerbation.  Patient's pain is atypical for ACS, given its reproducibility, intermittent nature at rest.  Additionally, patient had clean coronary arteries in 2015 on coronary catheterization.  History is also typical for pulmonary embolism.  Patient does have extensive history of PE, how is appropriate anticoagulated, and has IVC filters in place.  Patient has equal pulses in all extremities, no wide mediastinum on x-ray, and pain is not radiating to the back, therefore doubt AAS. Vitals stable.  Patient delta troponin negative.  Patient stable hemoglobin 13.7 with low MCV.  EKG without significant changes from prior.  Examination is consistent with chest wall pain whether due to costochondritis versus herpes zoster abruption without rash.  In addition, patient has wheezes and increased coughing to suggest that while  he  does not have a diagnosis COPD, may be experiencing reactive airway disease.  Will treat for both etiologies with steroids, antibiotics, doxycycline, and scheduled albuterol treatments.    Patient waiting medication emergency department.  Patient clinically stable for discharge prior to this administration.  Care signed out to Dr. Donna Bernard to discharge patient after medication.   This is a shared visit with Dr. Merrily Pew. Patient was independently evaluated by this attending physician. Attending physician consulted in evaluation and discharge management.  Final Clinical Impressions(s) / ED Diagnoses   Final diagnoses:  Left-sided chest wall pain  Wheezing  Elevated blood pressure reading with diagnosis of hypertension    ED Discharge Orders        Ordered    valACYclovir (VALTREX) 1000 MG tablet  3 times daily     03/28/18 1734    doxycycline (VIBRAMYCIN) 100 MG capsule  2 times daily     03/28/18 1734    predniSONE (DELTASONE) 10 MG tablet     03/28/18 1734    oxyCODONE (OXY IR/ROXICODONE) 5 MG immediate release tablet  Every 6 hours PRN     03/28/18 1734       Tamala Julian 03/28/18 1759    Mesner, Corene Cornea, MD 03/29/18 1542

## 2018-04-23 ENCOUNTER — Emergency Department (HOSPITAL_COMMUNITY): Payer: Medicare Other

## 2018-04-23 ENCOUNTER — Emergency Department (HOSPITAL_COMMUNITY)
Admission: EM | Admit: 2018-04-23 | Discharge: 2018-04-23 | Disposition: A | Discharge status: Home / Self Care | Payer: Medicare Other | Attending: Emergency Medicine | Admitting: Emergency Medicine

## 2018-04-23 ENCOUNTER — Other Ambulatory Visit: Payer: Self-pay

## 2018-04-23 ENCOUNTER — Encounter (HOSPITAL_COMMUNITY): Payer: Self-pay | Admitting: *Deleted

## 2018-04-23 DIAGNOSIS — R0789 Other chest pain: Secondary | ICD-10-CM | POA: Diagnosis not present

## 2018-04-23 DIAGNOSIS — N451 Epididymitis: Secondary | ICD-10-CM | POA: Insufficient documentation

## 2018-04-23 DIAGNOSIS — F17210 Nicotine dependence, cigarettes, uncomplicated: Secondary | ICD-10-CM | POA: Diagnosis not present

## 2018-04-23 DIAGNOSIS — R1030 Lower abdominal pain, unspecified: Secondary | ICD-10-CM | POA: Insufficient documentation

## 2018-04-23 DIAGNOSIS — N50811 Right testicular pain: Secondary | ICD-10-CM | POA: Diagnosis present

## 2018-04-23 DIAGNOSIS — R300 Dysuria: Secondary | ICD-10-CM | POA: Insufficient documentation

## 2018-04-23 DIAGNOSIS — I10 Essential (primary) hypertension: Secondary | ICD-10-CM | POA: Insufficient documentation

## 2018-04-23 DIAGNOSIS — Z79899 Other long term (current) drug therapy: Secondary | ICD-10-CM | POA: Insufficient documentation

## 2018-04-23 DIAGNOSIS — N50812 Left testicular pain: Secondary | ICD-10-CM | POA: Insufficient documentation

## 2018-04-23 DIAGNOSIS — Z86718 Personal history of other venous thrombosis and embolism: Secondary | ICD-10-CM | POA: Diagnosis not present

## 2018-04-23 LAB — COMPREHENSIVE METABOLIC PANEL
ALT: 12 U/L (ref 0–44)
AST: 15 U/L (ref 15–41)
Albumin: 4.2 g/dL (ref 3.5–5.0)
Alkaline Phosphatase: 48 U/L (ref 38–126)
Anion gap: 8 (ref 5–15)
BUN: 9 mg/dL (ref 8–23)
CO2: 32 mmol/L (ref 22–32)
Calcium: 9.9 mg/dL (ref 8.9–10.3)
Chloride: 103 mmol/L (ref 98–111)
Creatinine, Ser: 1.14 mg/dL (ref 0.61–1.24)
GFR calc Af Amer: >60 mL/min (ref >60)
GFR calc non Af Amer: >60 mL/min (ref >60)
Glucose, Bld: 105 mg/dL — ABNORMAL HIGH (ref 70–99)
Potassium: 4.3 mmol/L (ref 3.5–5.1)
Sodium: 143 mmol/L (ref 135–145)
Total Bilirubin: 0.8 mg/dL (ref 0.3–1.2)
Total Protein: 7.2 g/dL (ref 6.5–8.1)

## 2018-04-23 LAB — CBC WITH DIFFERENTIAL/PLATELET
Basophils Absolute: 0 10*3/uL (ref 0.0–0.1)
Basophils Relative: 0 %
Eosinophils Absolute: 0.2 10*3/uL (ref 0.0–0.7)
Eosinophils Relative: 2 %
HCT: 41.6 % (ref 39.0–52.0)
Hemoglobin: 13.8 g/dL (ref 13.0–17.0)
Lymphocytes Relative: 29 %
Lymphs Abs: 2.2 10*3/uL (ref 0.7–4.0)
MCH: 23.5 pg — ABNORMAL LOW (ref 26.0–34.0)
MCHC: 33.2 g/dL (ref 30.0–36.0)
MCV: 70.9 fL — ABNORMAL LOW (ref 78.0–100.0)
Monocytes Absolute: 0.8 10*3/uL (ref 0.1–1.0)
Monocytes Relative: 10 %
Neutro Abs: 4.4 10*3/uL (ref 1.7–7.7)
Neutrophils Relative %: 59 %
Platelets: 307 10*3/uL (ref 150–400)
RBC: 5.87 MIL/uL — ABNORMAL HIGH (ref 4.22–5.81)
RDW: 15.6 % — ABNORMAL HIGH (ref 11.5–15.5)
WBC: 7.5 10*3/uL (ref 4.0–10.5)

## 2018-04-23 LAB — URINALYSIS, ROUTINE W REFLEX MICROSCOPIC
Bacteria, UA: NONE SEEN
Bilirubin Urine: NEGATIVE
Glucose, UA: NEGATIVE mg/dL
Ketones, ur: NEGATIVE mg/dL
Leukocytes, UA: NEGATIVE
Nitrite: NEGATIVE
Protein, ur: 30 mg/dL — AB
Specific Gravity, Urine: 1.008 (ref 1.005–1.030)
pH: 7 (ref 5.0–8.0)

## 2018-04-23 LAB — I-STAT TROPONIN, ED: Troponin i, poc: 0 ng/mL (ref 0.00–0.08)

## 2018-04-23 LAB — LIPASE, BLOOD: Lipase: 25 U/L (ref 11–51)

## 2018-04-23 MED ORDER — HYDROMORPHONE HCL 1 MG/ML IJ SOLN
1.0000 mg | Freq: Once | INTRAMUSCULAR | Status: AC
  Administered 2018-04-23: 1 mg via INTRAVENOUS
  Filled 2018-04-23: qty 1

## 2018-04-23 MED ORDER — OXYCODONE-ACETAMINOPHEN 5-325 MG PO TABS
1.0000 | ORAL_TABLET | Freq: Once | ORAL | Status: AC
  Administered 2018-04-23: 1 via ORAL
  Filled 2018-04-23: qty 1

## 2018-04-23 MED ORDER — HYDROCODONE-ACETAMINOPHEN 5-325 MG PO TABS: 2.0000 | ORAL_TABLET | Freq: Three times a day (TID) | ORAL | 0 refills | Status: DC | PRN

## 2018-04-23 MED ORDER — DOXYCYCLINE HYCLATE 100 MG PO CAPS: 100.0000 mg | ORAL_CAPSULE | Freq: Two times a day (BID) | ORAL | 0 refills | Status: DC

## 2018-04-23 MED ORDER — IOPAMIDOL (ISOVUE-300) INJECTION 61%
INTRAVENOUS | Status: AC
  Filled 2018-04-23: qty 100

## 2018-04-23 MED ORDER — IOPAMIDOL (ISOVUE-300) INJECTION 61%
100.0000 mL | Freq: Once | INTRAVENOUS | Status: AC | PRN
  Administered 2018-04-23: 100 mL via INTRAVENOUS

## 2018-04-23 NOTE — ED Notes (Signed)
Ultrasound at bedside

## 2018-04-23 NOTE — ED Triage Notes (Signed)
Pt reports "shingles under my skin that causes pain in my groin"  And swelling.  He reports bila testicular pain x 1.5 weeks with dyspareunia.  He reports he was given medicine when he was seen at [place] in July which helped "some but it's coming back to life."  He is A&Ox 4.  In NAD.  He reports taking a hot back without relief.  States Dr. Jonelle Sidle instructed him to come to the ED today.

## 2018-04-23 NOTE — Consult Note (Signed)
Consult:  Requested by: Dr. Lennice Sites  History of Present Illness: Consult for left testicle pain. Pt with 1.5 weeks of left testicle pain of gradual onset. He also has dysuria but no gross hematuria. Scrotal US showed a smaller right testicle and only peripheral flow and flow to the left testicle. I reviewed the [place] and discussed with radiologst. Pt also underwent CT A/p and there was no stone or mass -- normal GU tract. Pt recalls an epsiode in 2014 when the right testicle swelled and he was admitted for IV abx. Since then the right testicle has been smaller.   Past Medical History:  Diagnosis Date  . Anxiety and depression   . Back pain   . Diverticulitis   . Diverticulosis   . DVT (deep venous thrombosis) ([place])   . Gout   . Hernia   . History of blood clots   . Hypertension   . Nausea and vomiting   . Neuropathy, peripheral 05/15/2012  . Shingles    Past Surgical History:  Procedure Laterality Date  . APPENDECTOMY    . BACK SURGERY    . CHOLECYSTECTOMY  2008  . HERNIA REPAIR    . HIATAL HERNIA REPAIR    . LEFT HEART CATHETERIZATION WITH CORONARY ANGIOGRAM N/A 08/02/2014   Procedure: LEFT HEART CATHETERIZATION WITH CORONARY ANGIOGRAM;  Surgeon: Laverda Page, MD;  Location: [facility] CATH LAB;  Service: Cardiovascular;  Laterality: N/A;  . MULTIPLE EXTRACTIONS WITH ALVEOLOPLASTY N/A 10/19/2015   Procedure: Extraction of tooth #'s 2,12,13 with alveoloplasty;  Surgeon: Lenn Cal, DDS;  Location: [facility];  Service: Oral Surgery;  Laterality: N/A;  . SACRAL NERVE STIMULATOR PLACEMENT  2011    Home Medications:   (Not in a hospital admission) Allergies:  Allergies  Allergen Reactions  . Aspirin Other (See Comments)    ulcers  . Celecoxib Other (See Comments)    Stomach irritation  . Ibuprofen Other (See Comments)    ulcers  . Lyrica [Pregabalin] Swelling  . Nsaids   . Vicodin [Hydrocodone-Acetaminophen]     Makes me mean     Family History  Problem Relation Age of  Onset  . Prostate cancer Father   . Heart disease Father   . Diabetes Mother   . Heart disease Mother   . Colon cancer Maternal Uncle        dx in his 68's  . Pancreatic cancer Paternal Uncle   . Prostate cancer Paternal Uncle   . Multiple sclerosis Daughter   . Prostate cancer Paternal Uncle    Social History:  reports that he has been smoking cigarettes. He has a 20.00 pack-year smoking history. He has never used smokeless tobacco. He reports that he drinks alcohol. He reports that he does not use drugs.  ROS: A complete review of systems was performed.  All systems are negative except for pertinent findings as noted. Review of Systems  All other systems reviewed and are negative.    Physical Exam:  Vital signs in last 24 hours: Temp:  [98.3 F (36.8 C)] 98.3 F (36.8 C) (08/15 1500) Pulse Rate:  [72-94] 73 (08/15 2200) Resp:  [13-16] 14 (08/15 2200) BP: (158-173)/(96-117) 171/103 (08/15 2200) SpO2:  [98 %-100 %] 100 % (08/15 2200) General:  Alert and oriented, No acute distress HEENT: Normocephalic, atraumatic Neck: No JVD or lymphadenopathy Cardiovascular: Regular rate and rhythm Lungs: Regular rate and effort Abdomen: Soft, nontender, nondistended, no abdominal masses Back: No CVA tenderness Extremities: No edema Neurologic:  Grossly intact GU: penis circumcised and without mass or lesion. Scrotum normal. Right testicle is round and atrophic. No pain and no mass. Left testicle palpably normal without mass but tender to palpation.   Laboratory Data:  Results for orders placed or performed during the hospital encounter of 04/23/18 (from the past 24 hour(s))  Urinalysis, Routine w reflex microscopic     Status: Abnormal   Collection Time: 04/23/18  4:46 PM  Result Value Ref Range   Color, Urine YELLOW YELLOW   APPearance CLEAR CLEAR   Specific Gravity, Urine 1.008 1.005 - 1.030   pH 7.0 5.0 - 8.0   Glucose, UA NEGATIVE NEGATIVE mg/dL   Hgb urine dipstick SMALL (A)  NEGATIVE   Bilirubin Urine NEGATIVE NEGATIVE   Ketones, ur NEGATIVE NEGATIVE mg/dL   Protein, ur 30 (A) NEGATIVE mg/dL   Nitrite NEGATIVE NEGATIVE   Leukocytes, UA NEGATIVE NEGATIVE   RBC / HPF 6-10 0 - 5 RBC/hpf   WBC, UA 0-5 0 - 5 WBC/hpf   Bacteria, UA NONE SEEN NONE SEEN   Mucus PRESENT   CBC with Differential     Status: Abnormal   Collection Time: 04/23/18  5:34 PM  Result Value Ref Range   WBC 7.5 4.0 - 10.5 K/uL   RBC 5.87 (H) 4.22 - 5.81 MIL/uL   Hemoglobin 13.8 13.0 - 17.0 g/dL   HCT 41.6 39.0 - 52.0 %   MCV 70.9 (L) 78.0 - 100.0 fL   MCH 23.5 (L) 26.0 - 34.0 pg   MCHC 33.2 30.0 - 36.0 g/dL   RDW 15.6 (H) 11.5 - 15.5 %   Platelets 307 150 - 400 K/uL   Neutrophils Relative % 59 %   Neutro Abs 4.4 1.7 - 7.7 K/uL   Lymphocytes Relative 29 %   Lymphs Abs 2.2 0.7 - 4.0 K/uL   Monocytes Relative 10 %   Monocytes Absolute 0.8 0.1 - 1.0 K/uL   Eosinophils Relative 2 %   Eosinophils Absolute 0.2 0.0 - 0.7 K/uL   Basophils Relative 0 %   Basophils Absolute 0.0 0.0 - 0.1 K/uL  Comprehensive metabolic panel     Status: Abnormal   Collection Time: 04/23/18  5:34 PM  Result Value Ref Range   Sodium 143 135 - 145 mmol/L   Potassium 4.3 3.5 - 5.1 mmol/L   Chloride 103 98 - 111 mmol/L   CO2 32 22 - 32 mmol/L   Glucose, Bld 105 (H) 70 - 99 mg/dL   BUN 9 8 - 23 mg/dL   Creatinine, Ser 1.14 0.61 - 1.24 mg/dL   Calcium 9.9 8.9 - 10.3 mg/dL   Total Protein 7.2 6.5 - 8.1 g/dL   Albumin 4.2 3.5 - 5.0 g/dL   AST 15 15 - 41 U/L   ALT 12 0 - 44 U/L   Alkaline Phosphatase 48 38 - 126 U/L   Total Bilirubin 0.8 0.3 - 1.2 mg/dL   GFR calc non Af Amer >60 >60 mL/min   GFR calc Af Amer >60 >60 mL/min   Anion gap 8 5 - 15  Lipase, blood     Status: None   Collection Time: 04/23/18  5:34 PM  Result Value Ref Range   Lipase 25 11 - 51 U/L  I-Stat Troponin, ED (not at [facility])     Status: None   Collection Time: 04/23/18  5:42 PM  Result Value Ref Range   Troponin i, poc 0.00 0.00 - 0.08  ng/mL   Comment  3           No results found for this or any previous visit (from the past 240 hour(s)). Creatinine: Recent Labs    04/23/18 1734  CREATININE 1.14    Impression/Assessment/plan:  1) Left testicle pain -- I would treat as an epididymitis with po doxy and pain meds. His wbc and vitals are normal, so I wouldn't think he needs inpatient admission from a GU pt of view. The findings on [place] in the right testicle are from chronic changes. He has no pain on exam and the right testicle is noticeably smaller than the left.   2) Microscopic hematuria - he should follow-up to recheck urine and for cystoscopy.     Festus Aloe 04/23/2018, 10:45 PM

## 2018-04-23 NOTE — ED Notes (Signed)
Bed: [facility]PT3 Expected date:  Expected time:  Means of arrival:  Comments: 

## 2018-04-23 NOTE — ED Provider Notes (Signed)
[place] DEPT Provider Note   CSN: [id number] Arrival date & time: 04/23/18  1453     History   Chief Complaint Chief Complaint  Patient presents with  . Recurrent Testicle Pain    HPI [name] [name] is a 67 y.o. male.  HPI   Pt is a 67 y/o male with a h/o diverticulitis, DVT, hernia repair, shingles, HTN who presents to the emergency department today for multiple complaints.  Pt reports that he has had testicular pain that has been present for the last 1.5 weeks.  Reports pain started on the left testicle and he now has pain bilaterally.  He reports associated itching/irritation to scrotum (x65month), chills, dysuria, testicular pain, redness/swelling to testicles, bilat groin pain, suprapubic and LLQ abdominal pain. Denies NVD, constipation, blood in stool, hematuria, fevers, or penile discharge. Denies urinary incontinence or retention. Pt states he is in a monogamous relationship and he has not had intercourse in the last 6 months due to dyspareunia. Denies concern or a h/o STD.  States he was told to come to the ED by Dr. Jonelle Sidle and states Dr. Jonelle Sidle would like him to f/u with urology. States he thinks he has a h/o prostatitis or epididymitis.  Patient further reports that he has had left-sided chest wall pain that feels like something is grabbing him with claws.  Pain is constant and has worsened since onset.  He states he feels consistent when he has been diagnosed with shingles in the past.  Denies any rashes.  Denies associated shortness of breath, diaphoresis, palpitations or substernal chest pain.  Denies any exacerbating or alleviating factors.  Past Medical History:  Diagnosis Date  . Anxiety and depression   . Back pain   . Diverticulitis   . Diverticulosis   . DVT (deep venous thrombosis) ([place])   . Gout   . Hernia   . History of blood clots   . Hypertension   . Nausea and vomiting   . Neuropathy, peripheral 05/15/2012  . Shingles      Patient Active Problem List   Diagnosis Date Noted  . Primary osteoarthritis of right knee 07/08/2017  . Complete rotator cuff tear of left shoulder 10/21/2016  . Gout attack 04/02/2016  . Angina pectoris ([place]) 08/01/2014  . Hemorrhage of rectum and anus 03/28/2014  . Neuropathy, peripheral 05/15/2012  . Pulmonary embolism ([place]) 07/11/2011  . DVT (deep venous thrombosis) ([place]) 07/11/2011  . Abdominal pain, left lower quadrant 01/29/2011  . Long term current use of anticoagulant therapy 01/29/2011  . Diarrhea 01/29/2011    Past Surgical History:  Procedure Laterality Date  . APPENDECTOMY    . BACK SURGERY    . CHOLECYSTECTOMY  2008  . HERNIA REPAIR    . HIATAL HERNIA REPAIR    . LEFT HEART CATHETERIZATION WITH CORONARY ANGIOGRAM N/A 08/02/2014   Procedure: LEFT HEART CATHETERIZATION WITH CORONARY ANGIOGRAM;  Surgeon: Laverda Page, MD;  Location: [facility] C[facility] LAB;  Service: Cardiovascular;  Laterality: N/A;  . MULTIPLE EXTRACTIONS WITH ALVEOLOPLASTY N/A 10/19/2015   Procedure: Extraction of tooth #'s 2,12,13 with alveoloplasty;  Surgeon: Lenn Cal, DDS;  Location: [place];  Service: Oral Surgery;  Laterality: N/A;  . SACRAL NERVE STIMULATOR PLACEMENT  2011        Home Medications    Prior to Admission medications   Medication Sig Start Date End Date Taking? Authorizing Provider  albuterol (PROVENTIL HFA;VENTOLIN HFA) 108 (90 Base) MCG/ACT inhaler Inhale 2 puffs into the  lungs every 6 (six) hours as needed for wheezing or shortness of breath. 01/27/17  Yes Nanavati, Ankit, MD  amoxicillin-clavulanate (AUGMENTIN) 500-125 MG tablet Take 500 mg by mouth 2 (two) times daily. 02/28/18  Yes [provider]  cetirizine (ZYRTEC) 10 MG tablet Take 10 mg by mouth as needed. 02/05/18  Yes [provider]  colchicine 0.6 MG tablet Take 1 tablet (0.6 mg total) by mouth 2 (two) times daily. Take 2 tablets for first dose then 1 pill twice daily for gout attack Patient  taking differently: Take 0.6 mg by mouth as needed. Take 2 tablets for first dose then 1 pill twice daily for gout attack  04/02/16 04/23/18 Yes [name], MD  DULoxetine (CYMBALTA) 60 MG capsule Take 120 mg by mouth daily.    Yes [provider]  fluticasone (FLONASE) 50 MCG/ACT nasal spray Place 1 spray into both nostrils 2 (two) times daily. 01/25/18  Yes [provider]  metoprolol tartrate (LOPRESSOR) 25 MG tablet Take 50 mg by mouth 2 (two) times daily.  07/29/14  Yes [provider]  NIFEDICAL XL 60 MG 24 hr tablet Take 120 mg by mouth daily.  02/21/14  Yes [provider]  omeprazole (PRILOSEC) 20 MG capsule Take 20 mg by mouth daily. 02/05/18  Yes [provider]  oxyCODONE (OXY IR/ROXICODONE) 5 MG immediate release tablet Take 1 tablet (5 mg total) by mouth every 6 (six) hours as needed for severe pain. Add 5 mg (1 tablet) additional to your regular oxycodone dose for acute pain. 03/28/18  Yes Valere Dross, Alyssa B, PA-C  rivaroxaban (XARELTO) 20 MG TABS tablet Take 1 tablet (20 mg total) by mouth daily with supper. 11/04/17  Yes [name], MD  VOLTAREN 1 % GEL Apply 2 g topically daily as needed (pain). For neck and shoulder pain 02/23/14  Yes [provider]  dextromethorphan-guaiFENesin (MUCINEX DM) 30-600 MG 12hr tablet Take 1 tablet by mouth 2 (two) times daily. Patient not taking: Reported on 03/28/2018 07/30/17   Shary Decamp, PA-C  Diclofenac Sodium (PENNSAID) 2 % SOLN Place 2 Squirts onto the skin 2 (two) times daily. Patient not taking: Reported on 03/28/2018 10/21/16   Meredith Pel, MD  doxycycline (VIBRAMYCIN) 100 MG capsule Take 1 capsule (100 mg total) by mouth 2 (two) times daily. 04/23/18   Airabella Barley S, PA-C  enoxaparin (LOVENOX) 120 MG/0.8ML injection Inject 0.8 mLs (120 mg total) into the skin every 12 (twelve) hours. Patient not taking: Reported on 03/28/2018 02/25/16   [name], MD    HYDROcodone-acetaminophen (NORCO/VICODIN) 5-325 MG tablet Take 2 tablets by mouth every 8 (eight) hours as needed. 04/23/18   Johnita Palleschi S, PA-C  ondansetron (ZOFRAN) 4 MG tablet Take 1 tablet (4 mg total) by mouth every 8 (eight) hours as needed for nausea or vomiting. Patient not taking: Reported on 03/28/2018 03/17/15   [name], MD  oxyCODONE-acetaminophen (PERCOCET/ROXICET) 5-325 MG tablet Take 1 tablet by mouth every 6 (six) hours as needed for severe pain. Patient not taking: Reported on 03/28/2018 06/30/17   Blanchie Dessert, MD  traMADol (ULTRAM) 50 MG tablet Take 1 tablet (50 mg total) by mouth every 6 (six) hours as needed. Patient not taking: Reported on 03/28/2018 09/05/17   Augusto Gamble B, NP  isosorbide mononitrate (IMDUR) 60 MG 24 hr tablet Take 60 mg by mouth daily.  11/28/11  [provider]    Family History Family History  Problem Relation Age of Onset  .  Prostate cancer Father   . Heart disease Father   . Diabetes Mother   . Heart disease Mother   . Colon cancer Maternal Uncle        dx in his 40's  . Pancreatic cancer Paternal Uncle   . Prostate cancer Paternal Uncle   . Multiple sclerosis Daughter   . Prostate cancer Paternal Uncle     Social History Social History   Tobacco Use  . Smoking status: Current Every Day Smoker    Packs/day: 0.50    Years: 40.00    Pack years: 20.00    Types: Cigarettes  . Smokeless tobacco: Never Used  . Tobacco comment: sometimes less.  Substance Use Topics  . Alcohol use: Yes    Alcohol/week: 0.0 standard drinks    Comment: Occasional  . Drug use: No    Types: Marijuana     Allergies   Aspirin; Celecoxib; Ibuprofen; Lyrica [pregabalin]; Nsaids; and Vicodin [hydrocodone-acetaminophen]   Review of Systems Review of Systems  Constitutional: Positive for chills.  HENT: Negative for sore throat.   Eyes: Negative for visual disturbance.  Respiratory: Negative for shortness of breath.    Cardiovascular: Negative for chest pain.  Gastrointestinal: Negative for blood in stool, constipation, diarrhea, nausea and vomiting.       Groin pain  Genitourinary: Positive for dysuria, genital sores, penile pain and testicular pain. Negative for discharge, frequency, hematuria and urgency.  Musculoskeletal: Negative for back pain.  Skin: Positive for color change.  Neurological: Negative for headaches.   Physical Exam Updated Vital Signs BP (!) 171/103 (BP Location: Left Arm)   Pulse 73   Temp 98.3 F (36.8 C) (Oral)   Resp 14   SpO2 100%   Physical Exam  Constitutional: He appears well-developed and well-nourished.  Nontoxic. Mild distress.  HENT:  Head: Normocephalic and atraumatic.  Eyes: Conjunctivae are normal.  Neck: Neck supple.  Cardiovascular: Normal rate, regular rhythm and normal heart sounds.  No murmur heard. Left sided chest wall pain. No overlying skin changes.   Pulmonary/Chest: Effort normal and breath sounds normal. No stridor. No respiratory distress. He has no wheezes.  Abdominal: Soft. Bowel sounds are normal. He exhibits no mass. There is tenderness (LLQ). There is no guarding. No hernia.  Well healed midline surgical scar  Genitourinary:  Genitourinary Comments: Chaperone present. Scrotum and testicles appear normal. Uncircumcised penis. No lesions. Mild TTP to the testes and epididymis bilaterally. No perineal TTP. No skin changes, lesions or swelling noted. No crepitus. DRE also completed with TTP and firmness to the prostate.   Musculoskeletal: He exhibits no edema.  Neurological: He is alert.  Skin: Skin is warm and dry.  Psychiatric: He has a normal mood and affect.  Nursing note and vitals reviewed.  ED Treatments / Results  Labs (all labs ordered are listed, but only abnormal results are displayed) Labs Reviewed  URINALYSIS, ROUTINE W REFLEX MICROSCOPIC - Abnormal; Notable for the following components:      Result Value   Hgb urine  dipstick SMALL (*)    Protein, ur 30 (*)    All other components within normal limits  CBC WITH DIFFERENTIAL/PLATELET - Abnormal; Notable for the following components:   RBC 5.87 (*)    MCV 70.9 (*)    MCH 23.5 (*)    RDW 15.6 (*)    All other components within normal limits  COMPREHENSIVE METABOLIC PANEL - Abnormal; Notable for the following components:   Glucose, Bld 105 (*)  All other components within normal limits  URINE CULTURE  LIPASE, BLOOD  I-STAT TROPONIN, ED  GC/CHLAMYDIA PROBE AMP ([place]) NOT AT [facility]    EKG EKG Interpretation  Date/Time:  Thursday April 23 2018 18:49:21 EDT Ventricular Rate:  76 PR Interval:    QRS Duration: 101 QT Interval:  384 QTC Calculation: 432 R Axis:   -68 Text Interpretation:  Sinus rhythm Incomplete RBBB and LAFB RSR' in V1 or V2, right VCD or RVH Confirmed by Lennice Sites [phone]) on 04/23/2018 10:18:29 PM   Radiology Dg Chest 2 View  Result Date: 04/23/2018 CLINICAL DATA:  Testicular pain EXAM: CHEST - 2 VIEW COMPARISON:  03/28/2018 FINDINGS: Heart and mediastinal contours are within normal limits. No focal opacities or effusions. No acute bony abnormality. Stable position of spinal stimulator wires over the mid to lower thoracic spine. IMPRESSION: No active cardiopulmonary disease. Electronically Signed   By: Rolm Baptise M.D.   On: 04/23/2018 18:10   Ct Abdomen Pelvis W Contrast  Result Date: 04/23/2018 CLINICAL DATA:  Groin pain. EXAM: CT ABDOMEN AND PELVIS WITH CONTRAST TECHNIQUE: Multidetector CT imaging of the abdomen and pelvis was performed using the standard protocol following bolus administration of intravenous contrast. CONTRAST:  158mL ISOVUE-300 IOPAMIDOL (ISOVUE-300) INJECTION 61% COMPARISON:  10/17/2014 FINDINGS: Lower chest: Fat containing right posterior diaphragmatic hernia. Hepatobiliary: No focal liver abnormality is seen. Status post cholecystectomy. No biliary dilatation. Pancreas: Unremarkable. No  pancreatic ductal dilatation or surrounding inflammatory changes. Spleen: Normal in size without focal abnormality. Adrenals/Urinary Tract: Adrenal glands are unremarkable. Kidneys are normal, without renal calculi, focal lesion, or hydronephrosis. Bladder is unremarkable. Stomach/Bowel: Stomach is within normal limits. Post appendectomy. No evidence of bowel wall thickening, distention, or inflammatory changes. Scattered colonic diverticulosis. Vascular/Lymphatic: Aortic atherosclerosis. No enlarged abdominal or pelvic lymph nodes. Reproductive: Prostate is unremarkable. Other: No abdominal wall hernia or abnormality. No abdominopelvic ascites. IVC filter in place. Sacral nerve stimulator present. Musculoskeletal: Spondylosis of the visualized spine. IMPRESSION: No evidence of acute abnormalities within the abdomen or pelvis. Scattered colonic diverticulosis. Electronically Signed   By: Fidela Salisbury M.D.   On: 04/23/2018 20:12   US Scrotum W/doppler  Addendum Date: 04/23/2018   ADDENDUM REPORT: 04/23/2018 19:35 ADDENDUM: Infiltrating hypovascular neoplasm cannot be completely excluded within the right testicle. Electronically Signed   By: Rolm Baptise M.D.   On: 04/23/2018 19:35   Result Date: 04/23/2018 CLINICAL DATA:  Bilateral testicular pain, right greater than left EXAM: SCROTAL ULTRASOUND DOPPLER ULTRASOUND OF THE TESTICLES TECHNIQUE: Complete ultrasound examination of the testicles, epididymis, and other scrotal structures was performed. Color and spectral Doppler ultrasound were also utilized to evaluate blood flow to the testicles. COMPARISON:  None. FINDINGS: Right testicle Measurements: 3.8 x 2.2 x 2.4 cm. Small cyst within the right testicle, 7 mm. Markedly heterogeneous echotexture. Centrally, there is ill-defined hypoechoic area. Left testicle Measurements: 4.0 x 2.0 x 3.1 cm. 8 mm cyst in the left testicle. Heterogeneous echotexture, not as pronounced as on the right. Right epididymis:   Normal in size and appearance. Left epididymis:  Small cyst in the epididymal head, 5 mm. Hydrocele:  Small bilateral Varicocele:  None visualized. Pulsed Doppler interrogation of both testes demonstrates arterial and venous waveforms bilaterally. Only minimal peripheral flow visualized in the right testicle. IMPRESSION: Heterogeneous appearance of the right testicle with central irregular hypoechoic area. Arterial and venous waveforms are documented, but only noted peripherally in the right testicle. A concern this could reflect partial torsion or segmental infarct within  the hypoechoic area. No well-defined mass. Consider urologic consultation. Small bilateral hydroceles. Electronically Signed: By: Rolm Baptise M.D. On: 04/23/2018 19:32    Procedures Procedures (including critical care time)  Medications Ordered in ED Medications  iopamidol (ISOVUE-300) 61 % injection (has no administration in time range)  HYDROmorphone (DILAUDID) injection 1 mg (1 mg Intravenous Given 04/23/18 1909)  iopamidol (ISOVUE-300) 61 % injection 100 mL (100 mLs Intravenous Contrast Given 04/23/18 1939)  HYDROmorphone (DILAUDID) injection 1 mg (1 mg Intravenous Given 04/23/18 2117)  oxyCODONE-acetaminophen (PERCOCET/ROXICET) 5-325 MG per tablet 1 tablet (1 tablet Oral Given 04/23/18 2323)     Initial Impression / Assessment and Plan / ED Course  I have reviewed the triage vital signs and the nursing notes.  Pertinent labs & imaging results that were available during my care of the patient were reviewed by me and considered in my medical decision making (see chart for details).    Discussed pt presentation and exam findings with Dr. Ronnald Nian, who agrees with the current workup, advises to obtain gc/chlamydia. advises to tx chest pain like MSK pain.   9:56 PM CONSULT With Urology, Dr. Junious Silk who will review the ultrasound and call me back.   10:14 PM CONSULT with urology, Dr. Junious Silk who reviewed the studies with  radiology. Recommended doxycycline x7 days.   10:47 PM Dr. Junious Silk also evaluated the patient in the emergency department.  He continues to advise doxycycline x7 days to treat for a suspected left-sided epididymitis.  Would like the patient to follow-up in the office in 1 week for reevaluation.  Final Clinical Impressions(s) / ED Diagnoses   Final diagnoses:  Epididymitis  Chest wall pain   Pt presenting with multiple complaints. HTN noted, but otherwise hemodynamically stable.  With regard to testicular pain, will obtain UA and labs. Question prostatitis, epididymitis, or UTI.  Testicular exam with tenderness bilaterally but no erythema, swelling or evidence of Fournier's.  Mild tenderness noted and lower suspicion for torsion.  UA negative for urinary tract infection.  There is some hematuria and proteinuria.  Urine culture was sent.  GC chlamydia was also obtained.  Scrotal ultrasound showed heterogeneous appearance of the right testicle with central regular hypo-echoic area.  Arterial and venous waveforms are documented but only noted peripherally in the right testicle.  Ultrasound findings were discussed with urology who evaluated the patient in the ED and reviewed the ultrasound with radiology.  Urology feel symptoms are most likely related to epididymitis and recommended treatment with doxycycline twice daily and follow-up in the office.  Given his left lower quadrant abdominal tenderness and history of diverticulitis CT scan of the abdomen was also ordered and was negative for any evidence of diverticulitis or other intra-abdominal abnormality at this time.  His labs are grossly reassuring.  He has a no leukocytosis.  Normal liver and kidney function.  Normal lipase.  Suspect that his symptoms of left lower quadrant pain may be referred from his testicular pain.  With regard to his chest pain, symptoms are reproducible with palpation of the left chest wall.  There is no skin change to  suggest shingles at this time.  Chest x-ray negative for pneumonia.  Troponin negative.  EKG unchanged from prior.  Less likely cardiac or pulmonary etiology at this time.  Symptoms sound atypical for ACS especially given that they are reproducible on exam.  Have advised him to follow-up with his PCP and if he develops a rash that he can either return to the ER  or his PCP for reevaluation of his symptoms.  Findings and plan discussed with patient and his family at bedside who understand the plan and reasons to return to the ER.  All questions answered.    ED Discharge Orders         Ordered    doxycycline (VIBRAMYCIN) 100 MG capsule  2 times daily     04/23/18 2249    HYDROcodone-acetaminophen (NORCO/VICODIN) 5-325 MG tablet  Every 8 hours PRN     04/23/18 2249           Jakorian Marengo S, PA-C 04/23/18 2325    Lennice Sites, DO 04/24/18 1114

## 2018-04-23 NOTE — Discharge Instructions (Addendum)
Hematuria, Adult Hematuria is blood in your urine. It can be caused by a bladder infection, kidney infection, prostate infection, kidney stone, or cancer of your urinary tract. Infections can usually be treated with medicine, and a kidney stone usually will pass through your urine. If neither of these is the cause of your hematuria, further workup to find out the reason may be needed. It is very important that you tell your health care provider about any blood you see in your urine, even if the blood stops without treatment or happens without causing pain. Blood in your urine that happens and then stops and then happens again can be a symptom of a very serious condition. Also, pain is not a symptom in the initial stages of many urinary cancers.  Be sure to follow-up with Dr. Junious Silk, [facility] to recheck the urine.   Follow these instructions at home:  Drink lots of fluid, 3-4 quarts a day. If you have been diagnosed with an infection, cranberry juice is especially recommended, in addition to large amounts of water.  Avoid caffeine, tea, and carbonated beverages because they tend to irritate the bladder.  Avoid alcohol because it may irritate the prostate.  Take all medicines as directed by your health care provider.  If you were prescribed an antibiotic medicine, finish it all even if you start to feel better.  If you have been diagnosed with a kidney stone, follow your health care provider's instructions regarding straining your urine to catch the stone.  Empty your bladder often. Avoid holding urine for long periods of time.  After a bowel movement, women should cleanse front to back. Use each tissue only once.  Empty your bladder before and after sexual intercourse if you are a male. Contact a health care provider if:  You develop back pain.  You have a fever.  You have a feeling of sickness in your stomach (nausea) or vomiting.  Your symptoms are not better in 3 days.  Return sooner if you are getting worse. Get help right away if:  You develop severe vomiting and are unable to keep the medicine down.  You develop severe back or abdominal pain despite taking your medicines.  You begin passing a large amount of blood or clots in your urine.  You feel extremely weak or faint, or you pass out. This information is not intended to replace advice given to you by your health care provider. Make sure you discuss any questions you have with your health care provider. Document Released: 08/26/2005 Document Revised: 02/01/2016 Document Reviewed: 04/26/2013 Elsevier Interactive Patient Education  2017 [employer]  Epididymitis Epididymitis is swelling (inflammation) of the epididymis. The epididymis is a cord-like structure that is located along the top and back part of the testicle. It collects and stores sperm from the testicle. This condition can also cause pain and swelling of the testicle and scrotum. Symptoms usually start suddenly (acute epididymitis). Sometimes epididymitis starts gradually and lasts for a while (chronic epididymitis). This type may be harder to treat. What are the causes? In men 27 and younger, this condition is usually caused by a bacterial infection or sexually transmitted disease (STD), such as:  Gonorrhea.  Chlamydia.  In men 65 and older who do not have anal sex, this condition is usually caused by bacteria from a blockage or abnormalities in the urinary system. These can result from:  Having a tube placed into the bladder (urinary catheter).  Having an enlarged or inflamed prostate gland.  Having recent urinary tract surgery.  In men who have a condition that weakens the bodys defense system (immune system), such as HIV, this condition can be caused by:  Other bacteria, including tuberculosis and syphilis.  Viruses.  Fungi.  Sometimes this condition occurs without infection. That may happen if urine flows backward  into the epididymis after heavy lifting or straining. What increases the risk? This condition is more likely to develop in men:  Who have unprotected sex with more than one partner.  Who have anal sex.  Who have recently had surgery.  Who have a urinary catheter.  Who have urinary problems.  Who have a suppressed immune system.  What are the signs or symptoms? This condition usually begins suddenly with chills, fever, and pain behind the scrotum and in the testicle. Other symptoms include:  Swelling of the scrotum, testicle, or both.  Pain whenejaculatingor urinating.  Pain in the back or belly.  Nausea.  Itching and discharge from the penis.  Frequent need to pass urine.  Redness and tenderness of the scrotum.  How is this diagnosed? Your health care provider can diagnose this condition based on your symptoms and medical history. Your health care provider will also do a physical exam to ask about your symptoms and check your scrotum and testicle for swelling, pain, and redness. You may also have other tests, including:  Examination of discharge from the penis.  Urine tests for infections, such as STDs.  Your health care provider may test you for other STDs, including HIV. How is this treated? Treatment for this condition depends on the cause. If your condition is caused by a bacterial infection, oral antibiotic medicine may be prescribed. If the bacterial infection has spread to your blood, you may need to receive IV antibiotics. Nonbacterial epididymitis is treated with home care that includes bed rest and elevation of the scrotum. Surgery may be needed to treat:  Bacterial epididymitis that causes pus to build up in the scrotum (abscess).  Chronic epididymitis that has not responded to other treatments.  Follow these instructions at home: Medicines  Take over-the-counter and prescription medicines only as told by your health care provider.  If you were  prescribed an antibiotic medicine, take it as told by your health care provider. Do not stop taking the antibiotic even if your condition improves. Sexual Activity  If your epididymitis was caused by an STD, avoid sexual activity until your treatment is complete.  Inform your sexual partner or partners if you test positive for an STD. They may need to be treated.Do not engage in sexual activity with your partner or partners until their treatment is completed. General instructions  Return to your normal activities as told by your health care provider. Ask your health care provider what activities are safe for you.  Keep your scrotum elevated and supported while resting. Ask your health care provider if you should wear a scrotal support, such as a jockstrap. Wear it as told by your health care provider.  If directed, apply ice to the affected area: ? Put ice in a plastic bag. ? Place a towel between your skin and the bag. ? Leave the ice on for 20 minutes, 2-3 times per day.  Try taking a sitz bath to help with discomfort. This is a warm water bath that is taken while you are sitting down. The water should only come up to your hips and should cover your buttocks. Do this 3-4 times per day or as  told by your health care provider.  Keep all follow-up visits as told by your health care provider. This is important. Contact a health care provider if:  You have a fever.  Your pain medicine is not helping.  Your pain is getting worse.  Your symptoms do not improve within three days. This information is not intended to replace advice given to you by your health care provider. Make sure you discuss any questions you have with your health care provider. Document Released: 08/23/2000 Document Revised: 02/01/2016 Document Reviewed: 01/11/2015 Elsevier Interactive Patient Education  2018 [employer].

## 2018-04-25 LAB — URINE CULTURE: Culture: NO GROWTH

## 2018-04-28 ENCOUNTER — Other Ambulatory Visit: Payer: Self-pay

## 2018-05-05 ENCOUNTER — Ambulatory Visit: Payer: Self-pay | Admitting: Oncology

## 2018-05-26 ENCOUNTER — Other Ambulatory Visit: Payer: Self-pay | Admitting: Oncology

## 2018-05-26 DIAGNOSIS — Z7901 Long term (current) use of anticoagulants: Secondary | ICD-10-CM

## 2018-05-26 DIAGNOSIS — I82401 Acute embolism and thrombosis of unspecified deep veins of right lower extremity: Secondary | ICD-10-CM

## 2018-05-26 NOTE — Telephone Encounter (Signed)
Please tell him he needs to schedule a follow up visit here otherwise I can not continue to refill his prescriptioons

## 2018-05-26 NOTE — Telephone Encounter (Signed)
Appt scheduled for next Mon, 06/01/2018 at 1115. Hubbard Hartshorn, RN, BSN

## 2018-06-01 ENCOUNTER — Encounter: Payer: Self-pay | Admitting: Oncology

## 2018-06-01 ENCOUNTER — Ambulatory Visit (HOSPITAL_COMMUNITY)
Admission: RE | Admit: 2018-06-01 | Discharge: 2018-06-01 | Disposition: A | Discharge status: Home / Self Care | Payer: Medicare Other | Source: Ambulatory Visit | Attending: Oncology | Admitting: Oncology

## 2018-06-01 ENCOUNTER — Ambulatory Visit (INDEPENDENT_AMBULATORY_CARE_PROVIDER_SITE_OTHER): Payer: Medicare Other | Admitting: Oncology

## 2018-06-01 ENCOUNTER — Other Ambulatory Visit: Payer: Self-pay | Admitting: Oncology

## 2018-06-01 ENCOUNTER — Other Ambulatory Visit: Payer: Self-pay

## 2018-06-01 VITALS — BP 141/87 | HR 76 | Temp 98.5°F | Ht 76.0 in | Wt 264.1 lb

## 2018-06-01 DIAGNOSIS — I10 Essential (primary) hypertension: Secondary | ICD-10-CM

## 2018-06-01 DIAGNOSIS — R0789 Other chest pain: Secondary | ICD-10-CM

## 2018-06-01 DIAGNOSIS — Z7901 Long term (current) use of anticoagulants: Secondary | ICD-10-CM

## 2018-06-01 DIAGNOSIS — G629 Polyneuropathy, unspecified: Secondary | ICD-10-CM

## 2018-06-01 DIAGNOSIS — Z885 Allergy status to narcotic agent status: Secondary | ICD-10-CM

## 2018-06-01 DIAGNOSIS — G8929 Other chronic pain: Secondary | ICD-10-CM

## 2018-06-01 DIAGNOSIS — I82503 Chronic embolism and thrombosis of unspecified deep veins of lower extremity, bilateral: Secondary | ICD-10-CM

## 2018-06-01 DIAGNOSIS — M549 Dorsalgia, unspecified: Secondary | ICD-10-CM

## 2018-06-01 DIAGNOSIS — M109 Gout, unspecified: Secondary | ICD-10-CM

## 2018-06-01 DIAGNOSIS — Z86711 Personal history of pulmonary embolism: Secondary | ICD-10-CM

## 2018-06-01 DIAGNOSIS — Z9689 Presence of other specified functional implants: Secondary | ICD-10-CM

## 2018-06-01 DIAGNOSIS — I2782 Chronic pulmonary embolism: Secondary | ICD-10-CM

## 2018-06-01 DIAGNOSIS — R0781 Pleurodynia: Secondary | ICD-10-CM

## 2018-06-01 DIAGNOSIS — Z79899 Other long term (current) drug therapy: Secondary | ICD-10-CM

## 2018-06-01 DIAGNOSIS — J988 Other specified respiratory disorders: Secondary | ICD-10-CM

## 2018-06-01 DIAGNOSIS — K9049 Malabsorption due to intolerance, not elsewhere classified: Secondary | ICD-10-CM

## 2018-06-01 DIAGNOSIS — B0229 Other postherpetic nervous system involvement: Secondary | ICD-10-CM | POA: Diagnosis not present

## 2018-06-01 DIAGNOSIS — M5431 Sciatica, right side: Secondary | ICD-10-CM

## 2018-06-01 DIAGNOSIS — Z86718 Personal history of other venous thrombosis and embolism: Secondary | ICD-10-CM

## 2018-06-01 DIAGNOSIS — Z95828 Presence of other vascular implants and grafts: Secondary | ICD-10-CM

## 2018-06-01 DIAGNOSIS — Z886 Allergy status to analgesic agent status: Secondary | ICD-10-CM

## 2018-06-01 DIAGNOSIS — Z9049 Acquired absence of other specified parts of digestive tract: Secondary | ICD-10-CM

## 2018-06-01 DIAGNOSIS — Z8619 Personal history of other infectious and parasitic diseases: Secondary | ICD-10-CM

## 2018-06-01 DIAGNOSIS — Z888 Allergy status to other drugs, medicaments and biological substances status: Secondary | ICD-10-CM

## 2018-06-01 NOTE — Progress Notes (Signed)
Hematology and Oncology Follow Up Visit  [name] [name] [id number] 04-17-1951 67 y.o. 06/01/2018 11:46 AM   Principle Diagnosis: Encounter Diagnoses  Name Primary?  . 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 Long term current use of anticoagulant therapy   . Other chronic pulmonary embolism without acute cor pulmonale [facility])   Clinical Summary: 67 year old man referred in August of 2011 for advice on long-term anticoagulation. He has a complex clotting history. He had bilateral DVTs and bilateral pulmonary emboli in 1990. Not documented in our hospital records. 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 He had an additional right lower extremity DVT in 2010 about one month after a cholecystectomy. He developed a right lower extremity superficial thrombosis when he was off Coumadin just seven days back in July of 2012 in anticipation of hernia surgery. A hypercoagulation profile revealed none of the common abnormalities associated with clotting. However, in view of his clinical history, I advised long-term Coumadin anticoagulation. We have been managing his anticoagulation in our office. 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 He has had wide fluctuations in his INR levels. He has had multiple visits to the emergency department for acute flareup of right calf pain. Venous Doppler study done on 02/25/2016 showed a new, acute, DVT in the right posterior tibial veins. Chronic thrombus noted in the right common femoral vein. Superficial thrombosis noted in varicosities in the greater saphenous vein at the level of the mid right calf. This event occurred at a time when his Coumadin was subtherapeutic. He was bridged with Lovenox and his Coumadin dose was adjusted. He had another study done on 06/03/2016 to evaluate recurrent right calf pain. No new acute abnormalities noted.  Chronic appearing changes noted in the left femoral vein and left posterior tibial veins. Chronic changes on the right. Due to wide fluctuations in his INRs, I elected to stop Coumadin and start him on Xarelto beginning on 06/21/2016.  He has chronic spine problems. He has a implanted TENS unit. He developed progressive peripheral neuropathy. He has burning dysesthesias of his feet radiating up his legs. At times he can't feel his feet on the gas pedal. He has been tried on a number of different medications including Lyrica and Neurontin but doesn't tolerate these medications do to what he believes is significant swelling when he takes them. He has been evaluated by Dr. Lonni Fix from neurology. One doctor told him he thought he had gout and put him on allopurinol which also caused swelling and he stopped it. His only joint symptom is in a right shoulder joint where he had previous rotator cuff surgery and he was subsequently told he has bony spurs.  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   Interim History:   He is having a flareup of chronic right sciatic pain.  He thinks that his tens unit may have shifted.  Chest x-ray done August 15 did not show any displacement of the device or the leads. He denies any dyspnea or chest pain.  No progressive swelling or pain of his right calf.  He continues on chronic Xarelto anticoagulation. He has an area of point tenderness which on exam today is localized to a lower rib midclavicular line anteriorly.  No history of trauma. He  continues to drive a tour bus and despite his chronic back pain. Gout has been controlled recently with no flareups.  He is using topical Voltaren gel.  Still on colchicine.  Main symptoms in his right ankle and toes. He is becoming increasingly depressed over the medical  problems that his family has.  3 out of 3 daughters have developed MS.  1 has expired due to complications.  Another is now in a nursing home.  Another handicapped stepdaughter still at home.  She developed blindness felt secondary to neurosarcoidosis.  Medications: reviewed  Allergies:  Allergies  Allergen Reactions  . Aspirin Other (See Comments)    ulcers  . Celecoxib Other (See Comments)    Stomach irritation  . Ibuprofen Other (See Comments)    ulcers  . Lyrica [Pregabalin] Swelling  . Nsaids   . Vicodin [Hydrocodone-Acetaminophen]     Makes me mean     Review of Systems: See interim history Remaining ROS negative:   Physical Exam: Blood pressure (!) 141/87, pulse 76, temperature 98.5 F (36.9 C), temperature source Oral, height 6\' 4"  (1.93 m), weight 264 lb 1.6 oz (119.8 kg), SpO2 100 %. Wt Readings from Last 3 Encounters:  06/01/18 264 lb 1.6 oz (119.8 kg)  02/03/18 262 lb (118.8 kg)  11/04/17 263 lb (119.3 kg)     General appearance: Well-nourished African-American man [name]: Pharynx no erythema, exudate, mass, or ulcer. No thyromegaly or thyroid nodules Lymph nodes: No cervical, supraclavicular, or axillary lymphadenopathy Breasts:  Lungs: Clear to auscultation, resonant to percussion throughout Heart: Regular rhythm, no murmur, no gallop, no rub, no click, no edema Abdomen: Soft, nontender, normal bowel sounds, no mass, no organomegaly Extremities: No edema, no calf tenderness Musculoskeletal: no joint deformities.  Exquisite point tenderness lower right rib along mid sternal clavicular line. GU:  Vascular: Carotid pulses 2+, no bruits, Neurologic: Alert, oriented, PERRLA, cranial nerves grossly normal, motor strength 5 over 5, reflexes absent symmetric at the knees, 1+ symmetric at the biceps, upper body coordination normal, gait normal, Skin: No rash or ecchymosis  Lab Results: CBC W/Diff    Component Value Date/Time   WBC 7.5 04/23/2018 1734   RBC 5.87  (H) 04/23/2018 1734   HGB 13.8 04/23/2018 1734   HGB 14.2 06/17/2017 1056   HGB 13.1 04/04/2014 1110   HCT 41.6 04/23/2018 1734   HCT 43.3 06/17/2017 1056   HCT 41.7 04/04/2014 1110   PLT 307 04/23/2018 1734   PLT 254 06/17/2017 1056   MCV 70.9 (L) 04/23/2018 1734   MCV 73 (L) 06/17/2017 1056   MCV 72.4 (L) 04/04/2014 1110   MCH 23.5 (L) 04/23/2018 1734   MCHC 33.2 04/23/2018 1734   RDW 15.6 (H) 04/23/2018 1734   RDW 15.7 (H) 06/17/2017 1056   RDW 16.5 (H) 04/04/2014 1110   LYMPHSABS 2.2 04/23/2018 1734   LYMPHSABS 1.6 06/17/2017 1056   LYMPHSABS 1.5 04/04/2014 1110   MONOABS 0.8 04/23/2018 1734   MONOABS 0.5 04/04/2014 1110   EOSABS 0.2 04/23/2018 1734   EOSABS 0.1 06/17/2017 1056   BASOSABS 0.0 04/23/2018 1734   BASOSABS 0.0 06/17/2017 1056   BASOSABS 0.1 04/04/2014 1110     Chemistry      Component Value Date/Time   NA 143 04/23/2018 1734   NA 146 (H) 03/17/2017 1134   NA 143 04/04/2014 1116   K 4.3 04/23/2018 1734   K 3.7 04/04/2014 1116   CL 103 04/23/2018 1734   CO2 32 04/23/2018 1734  CO2 30 (H) 04/04/2014 1116   BUN 9 04/23/2018 1734   BUN 11 03/17/2017 1134   BUN 10.1 04/04/2014 1116   CREATININE 1.14 04/23/2018 1734   CREATININE 1.0 04/04/2014 1116      Component Value Date/Time   CALCIUM 9.9 04/23/2018 1734   CALCIUM 9.3 04/04/2014 1116   ALKPHOS 48 04/23/2018 1734   ALKPHOS 45 04/04/2014 1116   AST 15 04/23/2018 1734   AST 14 04/04/2014 1116   ALT 12 04/23/2018 1734   ALT 10 04/04/2014 1116   BILITOT 0.8 04/23/2018 1734   BILITOT 0.3 03/17/2017 1134   BILITOT 0.48 04/04/2014 1116       Radiological Studies: No results found.  Impression:  1.  Idiopathic coagulopathy status post PE status post bilateral DVTs status post caval filter placement.  Currently stable on long-term Xarelto anticoagulation. I told him I would be retiring next spring.  I am happy to field any problems that arise with respect to his anticoagulation until that time.   He would like to transition to another hematologist for his future needs.  2.  Iron malabsorption Current hemoglobin 13.8 with MCV 70.9 on April 23, 2018.  MCV low even when iron is replete so I suspect he has an alpha thalassemia trait as well. He responds promptly to as needed parenteral iron infusions.  3.  Chronic postherpetic neuralgia currently back on gabapentin.  4.  Advanced degenerative arthritis of the spine with associated chronic back pain, lower extremity peripheral neuropathy, intermittent right sciatic pain.  Status post TENS unit placement.  5.  Essential hypertension  6.  Obstructive airway disease  7.  Gout  8.  Point tenderness right lower ribs. I will get rib detail films today.  CC: Patient Care Team: Elwyn Reach, MD as PCP - General (Internal Medicine)   Murriel Hopper, MD, [place]  Hematology-Oncology/Internal Medicine     9/23/201911:46 AM

## 2018-06-01 NOTE — Patient Instructions (Addendum)
To Radiology today for rib X-Rays Return visit: as needed. You can call Dr Darnell Level for any blood clot problems until March, 2020.  Referral to [place] center for follow up Blood Specialist care will be made.

## 2018-06-01 NOTE — Progress Notes (Signed)
referal hematology

## 2018-06-02 ENCOUNTER — Telehealth: Payer: Self-pay | Admitting: *Deleted

## 2018-06-02 NOTE — Telephone Encounter (Signed)
Pt called / informed "no abnormalities found in his ribs to explain his pain " per Dr Beryle Beams. Stated "something is going on" and he will make an appt to see Dr Jonelle Sidle.

## 2018-06-02 NOTE — Telephone Encounter (Signed)
Noted thanks °

## 2018-06-02 NOTE — Telephone Encounter (Signed)
-----   Message from [name], MD sent at 06/01/2018  4:54 PM EDT ----- Call pt: no abnormalities found in his ribs to explain his pain

## 2018-07-07 ENCOUNTER — Telehealth: Payer: Self-pay | Admitting: Oncology

## 2018-07-07 NOTE — Telephone Encounter (Addendum)
Pt wanted to know the dentist's name who he saw about a yr ago; stated his PCP office did not know.  I do see anything- told him I will continue to check his chart.

## 2018-07-07 NOTE — Telephone Encounter (Signed)
Patient requesting a call back from a nurse.

## 2018-07-15 NOTE — Telephone Encounter (Signed)
Noted Thanks Do we need to make a referral? DrG

## 2018-07-15 NOTE — Telephone Encounter (Signed)
10/2015, found a note from dr Enrique Sack, called pt, he said he found that name also but was told he was an emergency dentist for [facility] and did not take pt's unless it was an emergent visit. Suggested [employer] or [employer]

## 2018-07-16 NOTE — Telephone Encounter (Signed)
OK - thanks

## 2018-07-16 NOTE — Telephone Encounter (Signed)
No need he will find his own

## 2018-08-29 ENCOUNTER — Other Ambulatory Visit: Payer: Self-pay | Admitting: Oncology

## 2018-08-29 DIAGNOSIS — I82401 Acute embolism and thrombosis of unspecified deep veins of right lower extremity: Secondary | ICD-10-CM

## 2018-08-29 DIAGNOSIS — Z7901 Long term (current) use of anticoagulants: Secondary | ICD-10-CM

## 2018-09-07 ENCOUNTER — Telehealth: Payer: Self-pay | Admitting: *Deleted

## 2018-09-07 NOTE — Telephone Encounter (Signed)
Call from pt - stated he's having back surgery in [facility] on 09/23/18 and needs to stopped blood thinner, Xartelto. I asked pt to have the doctor's office to send [place] the information; given out fax #.

## 2018-09-14 ENCOUNTER — Other Ambulatory Visit: Payer: Self-pay | Admitting: Oncology

## 2018-09-14 DIAGNOSIS — I82503 Chronic embolism and thrombosis of unspecified deep veins of lower extremity, bilateral: Secondary | ICD-10-CM

## 2018-09-14 DIAGNOSIS — Z7901 Long term (current) use of anticoagulants: Secondary | ICD-10-CM

## 2018-09-14 NOTE — Telephone Encounter (Signed)
Called pt - no answer;let message to call the office .

## 2018-09-14 NOTE — Telephone Encounter (Signed)
Please get him in this week for lab. Instruct him to hold Xarelto for 48 hours before surgery. Surgeon can decide when he resumes but usually 2 days post op if all goes well.

## 2018-09-16 NOTE — Telephone Encounter (Signed)
Noted thanks °

## 2018-09-16 NOTE — Telephone Encounter (Signed)
Pt stated he will come tomorrow morning @ 1000 AM for labs. Also told pt to "hold Xarelto for 48 hours before surgery.Surgeon cn decide when he resumes" per Dr Beryle Beams. Stated ok. Informed we have not received fax from surgeon about medical clearance.

## 2018-09-17 ENCOUNTER — Other Ambulatory Visit (INDEPENDENT_AMBULATORY_CARE_PROVIDER_SITE_OTHER): Payer: Medicare Other

## 2018-09-17 DIAGNOSIS — Z7901 Long term (current) use of anticoagulants: Secondary | ICD-10-CM

## 2018-09-17 DIAGNOSIS — I82503 Chronic embolism and thrombosis of unspecified deep veins of lower extremity, bilateral: Secondary | ICD-10-CM

## 2018-09-18 ENCOUNTER — Telehealth: Payer: Self-pay | Admitting: *Deleted

## 2018-09-18 LAB — CBC WITH DIFFERENTIAL/PLATELET
Basophils Absolute: 0 10*3/uL (ref 0.0–0.2)
Basos: 1 %
EOS (ABSOLUTE): 0.1 10*3/uL (ref 0.0–0.4)
Eos: 2 %
Hematocrit: 42.2 % (ref 37.5–51.0)
Hemoglobin: 13.5 g/dL (ref 13.0–17.7)
Immature Grans (Abs): 0 10*3/uL (ref 0.0–0.1)
Immature Granulocytes: 0 %
Lymphocytes Absolute: 1.2 10*3/uL (ref 0.7–3.1)
Lymphs: 29 %
MCH: 23.4 pg — ABNORMAL LOW (ref 26.6–33.0)
MCHC: 32 g/dL (ref 31.5–35.7)
MCV: 73 fL — ABNORMAL LOW (ref 79–97)
Monocytes Absolute: 0.4 10*3/uL (ref 0.1–0.9)
Monocytes: 10 %
Neutrophils Absolute: 2.4 10*3/uL (ref 1.4–7.0)
Neutrophils: 58 %
Platelets: 273 10*3/uL (ref 150–450)
RBC: 5.76 x10E6/uL (ref 4.14–5.80)
RDW: 15.4 % (ref 11.6–15.4)
WBC: 4.1 10*3/uL (ref 3.4–10.8)

## 2018-09-18 LAB — BASIC METABOLIC PANEL
BUN/Creatinine Ratio: 12 (ref 10–24)
BUN: 12 mg/dL (ref 8–27)
CO2: 25 mmol/L (ref 20–29)
Calcium: 9.6 mg/dL (ref 8.6–10.2)
Chloride: 101 mmol/L (ref 96–106)
Creatinine, Ser: 1.02 mg/dL (ref 0.76–1.27)
GFR calc Af Amer: 87 mL/min/{1.73_m2} (ref >59)
GFR calc non Af Amer: 75 mL/min/{1.73_m2} (ref >59)
Glucose: 95 mg/dL (ref 65–99)
Potassium: 4.1 mmol/L (ref 3.5–5.2)
Sodium: 143 mmol/L (ref 134–144)

## 2018-09-18 NOTE — Telephone Encounter (Signed)
Pt called / informed "lab good" per Dr Beryle Beams. Have not seen medical clearance form from his surgeon's office but remined pt to hold Xarelto 48 hrs before surgery and resume  per the surgeon. Voiced understanding.

## 2018-09-18 NOTE — Telephone Encounter (Signed)
-----   Message from [name], MD sent at 09/18/2018  6:33 AM EST ----- Call pt: lab good

## 2018-10-08 ENCOUNTER — Telehealth: Payer: Self-pay | Admitting: *Deleted

## 2018-10-08 NOTE — Telephone Encounter (Signed)
1. He needs to see whoever did surgery on his back if he is having leg pain. 2. He does not need to stop Xarelto to have a simple dental extraction. He can have dentist call me.

## 2018-10-08 NOTE — Telephone Encounter (Signed)
Call from pt - stated he had his back surgery on 1/15 (to replace battery in his stimulator) and now having bilateral leg pain. Wanting to know if he needs to come in for labs? Also c/o tootth pain; stated he can not get anyone to pull the tooth d/t being on Xarelto. Thanks

## 2018-10-08 NOTE — Telephone Encounter (Signed)
Pt called / informed of Dr Synthia Innocent response - he understands to call the surgeon about his leg pain; stated he wanted to be sure he does not have any clots. Also explained to have his dentist to call Dr Darnell Level if needed.

## 2018-11-09 ENCOUNTER — Encounter: Payer: Self-pay | Admitting: *Deleted

## 2018-12-07 ENCOUNTER — Other Ambulatory Visit: Payer: Self-pay | Admitting: Oncology

## 2018-12-07 DIAGNOSIS — I82401 Acute embolism and thrombosis of unspecified deep veins of right lower extremity: Secondary | ICD-10-CM

## 2018-12-07 DIAGNOSIS — Z7901 Long term (current) use of anticoagulants: Secondary | ICD-10-CM

## 2018-12-07 MED ORDER — RIVAROXABAN 20 MG PO TABS: ORAL_TABLET | ORAL | 0 refills | Status: DC

## 2018-12-07 NOTE — Telephone Encounter (Signed)
Please inform pt I will refill with 2 additional refills but he will need to get subsequent refills from his primary care MD.

## 2018-12-07 NOTE — Telephone Encounter (Signed)
Pt aware.  Rx signed off in separate encounter.Despina Hidden Cassady3/30/20203:26 PM

## 2018-12-10 ENCOUNTER — Encounter: Payer: Self-pay | Admitting: Hematology

## 2018-12-10 ENCOUNTER — Telehealth: Payer: Self-pay | Admitting: Hematology

## 2018-12-10 NOTE — Telephone Encounter (Signed)
A new hem appt has been scheduled for the pt to see Dr. Irene Limbo on 5/20 at 10am. Letter mailed.

## 2019-01-27 ENCOUNTER — Other Ambulatory Visit: Payer: Medicare Other

## 2019-01-27 ENCOUNTER — Encounter: Payer: Self-pay | Admitting: Hematology

## 2019-01-27 ENCOUNTER — Telehealth: Payer: Self-pay | Admitting: *Deleted

## 2019-01-27 NOTE — Telephone Encounter (Signed)
Late entry for 01/26/2019: Patient responsed to [facility] screener that he had following symptoms in past week - shortness of breath, cough and possible fever. Patient had been in touch with his PCP. Per Dr.Kale's instructions, contacted patient to inform him that due to his symptoms, Dr. Irene Limbo is rescheduling his appt for 6 weeks into future. Also advised him per Dr. Irene Limbo to f/u with his PCP if no better and/or go to ED if shortness of breath is severe. Patient verbalized understanding. Appt for 5/20 cancelled. Schedule msg sent.

## 2019-01-28 ENCOUNTER — Telehealth: Payer: Self-pay | Admitting: Hematology

## 2019-01-28 NOTE — Telephone Encounter (Signed)
Mr. [place] has been cld and rescheduled ot see Dr. Irene Limbo to 6/30 at 10am.

## 2019-01-29 ENCOUNTER — Emergency Department (HOSPITAL_COMMUNITY): Payer: Medicare Other

## 2019-01-29 ENCOUNTER — Emergency Department (HOSPITAL_COMMUNITY)
Admission: EM | Admit: 2019-01-29 | Discharge: 2019-01-29 | Disposition: A | Discharge status: Home Health | Payer: Medicare Other | Attending: Emergency Medicine | Admitting: Emergency Medicine

## 2019-01-29 ENCOUNTER — Other Ambulatory Visit: Payer: Self-pay

## 2019-01-29 DIAGNOSIS — F17210 Nicotine dependence, cigarettes, uncomplicated: Secondary | ICD-10-CM | POA: Diagnosis not present

## 2019-01-29 DIAGNOSIS — Z7901 Long term (current) use of anticoagulants: Secondary | ICD-10-CM | POA: Diagnosis not present

## 2019-01-29 DIAGNOSIS — R05 Cough: Secondary | ICD-10-CM | POA: Diagnosis not present

## 2019-01-29 DIAGNOSIS — I10 Essential (primary) hypertension: Secondary | ICD-10-CM | POA: Diagnosis not present

## 2019-01-29 DIAGNOSIS — R0602 Shortness of breath: Secondary | ICD-10-CM | POA: Diagnosis present

## 2019-01-29 DIAGNOSIS — Z20828 Contact with and (suspected) exposure to other viral communicable diseases: Secondary | ICD-10-CM | POA: Insufficient documentation

## 2019-01-29 DIAGNOSIS — J069 Acute upper respiratory infection, unspecified: Secondary | ICD-10-CM | POA: Diagnosis not present

## 2019-01-29 LAB — BASIC METABOLIC PANEL
Anion gap: 7 (ref 5–15)
BUN: 16 mg/dL (ref 8–23)
CO2: 31 mmol/L (ref 22–32)
Calcium: 9.3 mg/dL (ref 8.9–10.3)
Chloride: 100 mmol/L (ref 98–111)
Creatinine, Ser: 1.37 mg/dL — ABNORMAL HIGH (ref 0.61–1.24)
GFR calc Af Amer: >60 mL/min (ref >60)
GFR calc non Af Amer: 53 mL/min — ABNORMAL LOW (ref >60)
Glucose, Bld: 98 mg/dL (ref 70–99)
Potassium: 3.4 mmol/L — ABNORMAL LOW (ref 3.5–5.1)
Sodium: 138 mmol/L (ref 135–145)

## 2019-01-29 LAB — CBC WITH DIFFERENTIAL/PLATELET
Abs Immature Granulocytes: 0.01 10*3/uL (ref 0.00–0.07)
Basophils Absolute: 0 10*3/uL (ref 0.0–0.1)
Basophils Relative: 1 %
Eosinophils Absolute: 0.1 10*3/uL (ref 0.0–0.5)
Eosinophils Relative: 1 %
HCT: 44.6 % (ref 39.0–52.0)
Hemoglobin: 14.3 g/dL (ref 13.0–17.0)
Immature Granulocytes: 0 %
Lymphocytes Relative: 38 %
Lymphs Abs: 1.7 10*3/uL (ref 0.7–4.0)
MCH: 23.6 pg — ABNORMAL LOW (ref 26.0–34.0)
MCHC: 32.1 g/dL (ref 30.0–36.0)
MCV: 73.5 fL — ABNORMAL LOW (ref 80.0–100.0)
Monocytes Absolute: 0.4 10*3/uL (ref 0.1–1.0)
Monocytes Relative: 10 %
Neutro Abs: 2.2 10*3/uL (ref 1.7–7.7)
Neutrophils Relative %: 50 %
Platelets: 244 10*3/uL (ref 150–400)
RBC: 6.07 MIL/uL — ABNORMAL HIGH (ref 4.22–5.81)
RDW: 17.5 % — ABNORMAL HIGH (ref 11.5–15.5)
WBC: 4.5 10*3/uL (ref 4.0–10.5)
nRBC: 0 % (ref 0.0–0.2)

## 2019-01-29 LAB — BRAIN NATRIURETIC PEPTIDE: B Natriuretic Peptide: 20.6 pg/mL (ref 0.0–100.0)

## 2019-01-29 MED ORDER — ONDANSETRON HCL 4 MG/2ML IJ SOLN
INTRAMUSCULAR | Status: AC
  Administered 2019-01-29: 4 mg
  Filled 2019-01-29: qty 2

## 2019-01-29 MED ORDER — ONDANSETRON HCL 4 MG/2ML IJ SOLN: 4.0000 mg | Freq: Once | INTRAMUSCULAR | Status: DC

## 2019-01-29 MED ORDER — ACETAMINOPHEN 325 MG PO TABS
650.0000 mg | ORAL_TABLET | Freq: Once | ORAL | Status: DC
  Filled 2019-01-29: qty 2

## 2019-01-29 NOTE — ED Provider Notes (Signed)
[facility] DEPT Provider Note   CSN: [id number] Arrival date & time: 01/29/19  1810    History   Chief Complaint Chief Complaint  Patient presents with  . Shortness of Breath  . Cough    HPI [name] [name] is a 68 y.o. male who presents for evaluation of cough.  He he was sent in by his primary care physician and family.  He has had 2 to 3 weeks of productive cough, chest pain with cough, feeling more short of breath.  He denies any active chest pain at this time, orthopnea or PND.  He denies hemoptysis.  He said chills but denies fevers.  He denies loss of appetite, nausea vomiting or diarrhea.  Patient is concerned for new to the novel coronavirus.  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     HPI  Past Medical History:  Diagnosis Date  . Anxiety and depression   . Back pain   . Diverticulitis   . Diverticulosis   . DVT (deep venous thrombosis) ([place])   . Gout   . Hernia   . History of blood clots   . Hypertension   . Nausea and vomiting   . Neuropathy, peripheral 05/15/2012  . Shingles     Patient Active Problem List   Diagnosis Date Noted  . Primary osteoarthritis of right knee 07/08/2017  . Complete rotator cuff tear of left shoulder 10/21/2016  . Gout attack 04/02/2016  . Angina pectoris ([place]) 08/01/2014  . Hemorrhage of rectum and anus 03/28/2014  . Neuropathy, peripheral 05/15/2012  . Pulmonary embolism ([place]) 07/11/2011  . DVT (deep venous thrombosis) ([place]) 07/11/2011  . Abdominal pain, left lower quadrant 01/29/2011  . Long term current use of anticoagulant therapy 01/29/2011  . Diarrhea 01/29/2011    Past Surgical History:  Procedure Laterality Date  . APPENDECTOMY    . BACK SURGERY    . CHOLECYSTECTOMY  2008  . HERNIA REPAIR    . HIATAL HERNIA REPAIR    . LEFT HEART CATHETERIZATION WITH CORONARY ANGIOGRAM N/A 08/02/2014   Procedure: LEFT HEART CATHETERIZATION WITH CORONARY ANGIOGRAM;   Surgeon: Laverda Page, MD;  Location: [facility] CATH LAB;  Service: Cardiovascular;  Laterality: N/A;  . MULTIPLE EXTRACTIONS WITH ALVEOLOPLASTY N/A 10/19/2015   Procedure: Extraction of tooth #'s 2,12,13 with alveoloplasty;  Surgeon: Lenn Cal, DDS;  Location: [facility];  Service: Oral Surgery;  Laterality: N/A;  . SACRAL NERVE STIMULATOR PLACEMENT  2011        Home Medications    Prior to Admission medications   Medication Sig Start Date End Date Taking? Authorizing Provider  albuterol (PROVENTIL HFA;VENTOLIN HFA) 108 (90 Base) MCG/ACT inhaler Inhale 2 puffs into the lungs every 6 (six) hours as needed for wheezing or shortness of breath. 01/27/17   Varney Biles, MD  amoxicillin-clavulanate (AUGMENTIN) 500-125 MG tablet Take 500 mg by mouth 2 (two) times daily. 02/28/18   [provider]  cetirizine (ZYRTEC) 10 MG tablet Take 10 mg by mouth as needed. 02/05/18   [provider]  colchicine 0.6 MG tablet Take 1 tablet (0.6 mg total) by mouth 2 (two) times daily. Take 2 tablets for first dose then 1 pill twice daily for gout attack Patient taking differently: Take 0.6 mg by mouth as needed. Take 2 tablets for first dose then 1 pill twice daily for gout attack  04/02/16 04/23/18  [name], MD  dextromethorphan-guaiFENesin [facility] DM) 30-600  MG 12hr tablet Take 1 tablet by mouth 2 (two) times daily. Patient not taking: Reported on 03/28/2018 07/30/17   Shary Decamp, PA-C  Diclofenac Sodium (PENNSAID) 2 % SOLN Place 2 Squirts onto the skin 2 (two) times daily. Patient not taking: Reported on 03/28/2018 10/21/16   Meredith Pel, MD  doxycycline (VIBRAMYCIN) 100 MG capsule Take 1 capsule (100 mg total) by mouth 2 (two) times daily. 04/23/18   Couture, Cortni S, PA-C  DULoxetine (CYMBALTA) 60 MG capsule Take 120 mg by mouth daily.     [provider]  fluticasone (FLONASE) 50 MCG/ACT nasal spray Place 1 spray into both nostrils 2 (two) times daily. 01/25/18    [provider]  HYDROcodone-acetaminophen (NORCO/VICODIN) 5-325 MG tablet Take 2 tablets by mouth every 8 (eight) hours as needed. 04/23/18   Couture, Cortni S, PA-C  metoprolol tartrate (LOPRESSOR) 25 MG tablet Take 50 mg by mouth 2 (two) times daily.  07/29/14   [provider]  NIFEDICAL XL 60 MG 24 hr tablet Take 120 mg by mouth daily.  02/21/14   [provider]  omeprazole (PRILOSEC) 20 MG capsule Take 20 mg by mouth daily. 02/05/18   [provider]  ondansetron (ZOFRAN) 4 MG tablet Take 1 tablet (4 mg total) by mouth every 8 (eight) hours as needed for nausea or vomiting. Patient not taking: Reported on 03/28/2018 03/17/15   [name], MD  oxyCODONE (OXY IR/ROXICODONE) 5 MG immediate release tablet Take 1 tablet (5 mg total) by mouth every 6 (six) hours as needed for severe pain. Add 5 mg (1 tablet) additional to your regular oxycodone dose for acute pain. 03/28/18   Langston Masker B, PA-C  oxyCODONE-acetaminophen (PERCOCET/ROXICET) 5-325 MG tablet Take 1 tablet by mouth every 6 (six) hours as needed for severe pain. Patient not taking: Reported on 03/28/2018 06/30/17   Blanchie Dessert, MD  rivaroxaban (XARELTO) 20 MG TABS tablet TAKE 1 TABLET BY MOUTH ONCE DAILY WITH SUPPER 12/07/18   [name], MD  traMADol (ULTRAM) 50 MG tablet Take 1 tablet (50 mg total) by mouth every 6 (six) hours as needed. Patient not taking: Reported on 03/28/2018 09/05/17   Augusto Gamble B, NP  VOLTAREN 1 % GEL Apply 2 g topically daily as needed (pain). For neck and shoulder pain 02/23/14   [provider]  isosorbide mononitrate (IMDUR) 60 MG 24 hr tablet Take 60 mg by mouth daily.  11/28/11  [provider]    Family History Family History  Problem Relation Age of Onset  . Prostate cancer Father   . Heart disease Father   . Diabetes Mother   . Heart disease Mother   . Colon cancer Maternal Uncle        dx in his 77's  . Pancreatic cancer  Paternal Uncle   . Prostate cancer Paternal Uncle   . Multiple sclerosis Daughter   . Prostate cancer Paternal Uncle     Social History Social History   Tobacco Use  . Smoking status: Current Every Day Smoker    Packs/day: 0.50    Years: 40.00    Pack years: 20.00    Types: Cigarettes  . Smokeless tobacco: Never Used  Substance Use Topics  . Alcohol use: Yes    Alcohol/week: 0.0 standard drinks    Comment: Occasional  . Drug use: Yes    Types: Marijuana     Allergies   Aspirin; Celecoxib; Ibuprofen; Lyrica [pregabalin]; Nsaids; and Vicodin [hydrocodone-acetaminophen]  Review of Systems Review of Systems Ten systems reviewed and are negative for acute change, except as noted in the HPI.    Physical Exam Updated Vital Signs BP 135/86 (BP Location: Right Arm)   Pulse 87   Temp 98.7 F (37.1 C) (Oral)   Resp 16   SpO2 97%   Physical Exam  Physical Exam  Nursing note and vitals reviewed. Constitutional: He appears well-developed and well-nourished. No distress.  HENT:  Head: Normocephalic and atraumatic.  Eyes: Conjunctivae normal are normal. No scleral icterus.  Neck: Normal range of motion. Neck supple.  Cardiovascular: Normal rate, regular rhythm and normal heart sounds.   Pulmonary/Chest: Effort normal. No respiratory distress. ronchi that clear with cough Abdominal: Soft. There is no tenderness.  Musculoskeletal: He exhibits no edema.  Neurological: He is alert.  Skin: Skin is warm and dry. He is not diaphoretic.  Psychiatric: His behavior is normal.    ED Treatments / Results  Labs (all labs ordered are listed, but only abnormal results are displayed) Labs Reviewed  CBC WITH DIFFERENTIAL/PLATELET - Abnormal; Notable for the following components:      Result Value   RBC 6.07 (*)    MCV 73.5 (*)    MCH 23.6 (*)    RDW 17.5 (*)    All other components within normal limits  NOVEL CORONAVIRUS, NAA (HOSPITAL ORDER, SEND-OUT TO REF LAB)  BASIC  METABOLIC PANEL  BRAIN NATRIURETIC PEPTIDE    EKG None  Radiology Dg Chest 2 View  Result Date: 01/29/2019 CLINICAL DATA:  Shortness of breath EXAM: CHEST - 2 VIEW COMPARISON:  06/01/2018 FINDINGS: The heart size and mediastinal contours are within normal limits. Both lungs are clear. The visualized skeletal structures are unremarkable. Midthoracic spinal stimulator leads. IMPRESSION: Clear lungs. Electronically Signed   By: Ulyses Jarred M.D.   On: 01/29/2019 19:09    Procedures Procedures (including critical care time)  Medications Ordered in ED Medications - No data to display   Initial Impression / Assessment and Plan / ED Course  I have reviewed the triage vital signs and the nursing notes.  Pertinent labs & imaging results that were available during my care of the patient were reviewed by me and considered in my medical decision making (see chart for details).        [name] [name] VS: BP (!) 146/96 (BP Location: Right Arm)   Pulse 88   Temp 98.6 F (37 C) (Oral)   Resp 20   SpO2 99%  [name] [name] is gathered by patient  and emr. [name] [name] diagnosis for emergent cause of cough includes but is not limited to upper respiratory infection, lower respiratory infection, allergies, asthma, irritants, foreign body, medications such as ACE inhibitors, reflux, asthma, CHF, lung cancer, interstitial lung disease, psychiatric causes, postnasal drip and postinfectious bronchospasm. The emergent differential diagnosis for shortness of breath includes, but is not limited to, Pulmonary edema, bronchoconstriction, Pneumonia, Pulmonary embolism, Pneumotherax/ Hemothorax, Dysrythmia, ACS.  Labs: I reviewed the labs which show slight elevation in Cr up from 4 mos ago without AKI. micorcytosis of hgb without anemia. Normal bnp  Imaging: I personally reviewed the images (2 c chest ) which show(s) no acute abnormalities including edema or infiltrates. EKG:  EKG Interpretation  Date/Time:   Friday Jan 29 2019 18:22:57 EDT Ventricular Rate:  88 PR Interval:    QRS Duration: 100 QT Interval:  381 QTC Calculation: 461 R Axis:   -103 Text Interpretation:  Sinus rhythm LAD, consider left anterior fascicular  block Baseline wander in lead(s) V5 since last tracing no significant change Confirmed by Malvin Johns [phone]) on 01/29/2019 9:36:52 PM Also confirmed by Malvin Johns [phone]), editor Hattie Perch ([id number])  on 01/30/2019 1:22:44 PM      LOV:FIEPPIR sxs consistent with allergies/ URI. No clinical suggestion of CHF, PE. Patient is afebrile and hds without active cp. Patient Sars-CoV-2 test pending. Appears appropriate for d/c with home isolation. Patient disposition:discharge Patient condition: good. The patient appears reasonably screened and/or stabilized for discharge and I doubt any other medical condition or other [facility] requiring further screening, evaluation, or treatment in the ED at this time prior to discharge. I have discussed lab and/or imaging findings with the patient and answered all questions/concerns to the best of my ability. I have discussed return precautions and OP follow up.    JAQUANN GUARISCO was evaluated in Emergency Department on 02/01/2019 for the symptoms described in the history of present illness. He was evaluated in the context of the global COVID-19 pandemic, which necessitated consideration that the patient might be at risk for infection with the SARS-CoV-2 virus that causes COVID-19. Institutional protocols and algorithms that pertain to the evaluation of patients at risk for COVID-19 are in a state of rapid change based on information released by regulatory bodies including the CDC and federal and state organizations. These policies and algorithms were followed during the patient's care in the ED.   Final Clinical Impressions(s) / ED Diagnoses   Final diagnoses:  None    ED Discharge Orders    None       Margarita Mail, PA-C 02/01/19  1339    Malvin Johns, MD 02/01/19 1655

## 2019-01-29 NOTE — ED Triage Notes (Addendum)
Pt reports for 2-3 weeks had cough with yellow-brownish phlegm, SOB and chest pains with coughing. Pt also had eye drainage and muscle spasms. Reports his work wants him tested for Covid since he has had symptoms. 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

## 2019-01-29 NOTE — Discharge Instructions (Addendum)
Your coronavirus test is pending and should return in the next 24-36 hours.  Please follow up with your primary care doctor. Follow home isolation techniques until your test returns.    Person Under Monitoring Name: [name] [name]  Location: [place] [address] [place] [address]   Infection Prevention Recommendations for Individuals Confirmed to have, or Being Evaluated for, 2019 Novel Coronavirus (COVID-19) Infection Who Receive Care at Home  Individuals who are confirmed to have, or are being evaluated for, COVID-19 should follow the prevention steps below until a healthcare provider or local or state health department says they can return to normal activities.  Stay home except to get medical care You should restrict activities outside your home, except for getting medical care. Do not go to work, school, or public areas, and do not use public transportation or taxis.  Call ahead before visiting your doctor Before your medical appointment, call the healthcare provider and tell them that you have, or are being evaluated for, COVID-19 infection. This will help the healthcare providers office take steps to keep other people from getting infected. Ask your healthcare provider to call the local or state health department.  Monitor your symptoms Seek prompt medical attention if your illness is worsening (e.g., difficulty breathing). Before going to your medical appointment, call the healthcare provider and tell them that you have, or are being evaluated for, COVID-19 infection. Ask your healthcare provider to call the local or state health department.  Wear a facemask You should wear a facemask that covers your nose and mouth when you are in the same room with other people and when you visit a healthcare provider. People who live with or visit you should also wear a facemask while they are in the same room with you.  Separate yourself from other people in your home As  much as possible, you should stay in a different room from other people in your home. Also, you should use a separate bathroom, if available.  Avoid sharing household items You should not share dishes, drinking glasses, cups, eating utensils, towels, bedding, or other items with other people in your home. After using these items, you should wash them thoroughly with soap and water.  Cover your coughs and sneezes Cover your mouth and nose with a tissue when you cough or sneeze, or you can cough or sneeze into your sleeve. Throw used tissues in a lined trash can, and immediately wash your hands with soap and water for at least 20 seconds or use an alcohol-based hand rub.  Wash your [facility] your hands often and thoroughly with soap and water for at least 20 seconds. You can use an alcohol-based hand sanitizer if soap and water are not available and if your hands are not visibly dirty. Avoid touching your eyes, nose, and mouth with unwashed hands.   Prevention Steps for Caregivers and Household Members of Individuals Confirmed to have, or Being Evaluated for, COVID-19 Infection Being Cared for in the Home  If you live with, or provide care at home for, a person confirmed to have, or being evaluated for, COVID-19 infection please follow these guidelines to prevent infection:  Follow healthcare providers instructions Make sure that you understand and can help the patient follow any healthcare provider instructions for all care.  Provide for the patients basic needs You should help the patient with basic needs in the home and provide support for getting groceries, prescriptions, and other personal needs.  Monitor the patients  symptoms If they are getting sicker, call his or her medical provider and tell them that the patient has, or is being evaluated for, COVID-19 infection. This will help the healthcare providers office take steps to keep other people from getting infected. Ask  the healthcare provider to call the local or state health department.  Limit the number of people who have contact with the patient If possible, have only one caregiver for the patient. Other household members should stay in another home or place of residence. If this is not possible, they should stay in another room, or be separated from the patient as much as possible. Use a separate bathroom, if available. Restrict visitors who do not have an essential need to be in the home.  Keep older adults, very young children, and other sick people away from the patient Keep older adults, very young children, and those who have compromised immune systems or chronic health conditions away from the patient. This includes people with chronic heart, lung, or kidney conditions, diabetes, and cancer.  Ensure good ventilation Make sure that shared spaces in the home have good air flow, such as from an air conditioner or an opened window, weather permitting.  Wash your hands often Wash your hands often and thoroughly with soap and water for at least 20 seconds. You can use an alcohol based hand sanitizer if soap and water are not available and if your hands are not visibly dirty. Avoid touching your eyes, nose, and mouth with unwashed hands. Use disposable paper towels to dry your hands. If not available, use dedicated cloth towels and replace them when they become wet.  Wear a facemask and gloves Wear a disposable facemask at all times in the room and gloves when you touch or have contact with the patients blood, body fluids, and/or secretions or excretions, such as sweat, saliva, sputum, nasal mucus, vomit, urine, or feces.  Ensure the mask fits over your nose and mouth tightly, and do not touch it during use. Throw out disposable facemasks and gloves after using them. Do not reuse. Wash your hands immediately after removing your facemask and gloves. If your personal clothing becomes contaminated,  carefully remove clothing and launder. Wash your hands after handling contaminated clothing. Place all used disposable facemasks, gloves, and other waste in a lined container before disposing them with other household waste. Remove gloves and wash your hands immediately after handling these items.  Do not share dishes, glasses, or other household items with the patient Avoid sharing household items. You should not share dishes, drinking glasses, cups, eating utensils, towels, bedding, or other items with a patient who is confirmed to have, or being evaluated for, COVID-19 infection. After the person uses these items, you should wash them thoroughly with soap and water.  Wash laundry thoroughly Immediately remove and wash clothes or bedding that have blood, body fluids, and/or secretions or excretions, such as sweat, saliva, sputum, nasal mucus, vomit, urine, or feces, on them. Wear gloves when handling laundry from the patient. Read and follow directions on labels of laundry or clothing items and detergent. In general, wash and dry with the warmest temperatures recommended on the label.  Clean all areas the individual has used often Clean all touchable surfaces, such as counters, tabletops, doorknobs, bathroom fixtures, toilets, phones, keyboards, tablets, and bedside tables, every day. Also, clean any surfaces that may have blood, body fluids, and/or secretions or excretions on them. Wear gloves when cleaning surfaces the patient has come in contact  with. Use a diluted bleach solution (e.g., dilute bleach with 1 part bleach and 10 parts water) or a household disinfectant with a label that says EPA-registered for coronaviruses. To make a bleach solution at home, add 1 tablespoon of bleach to 1 quart (4 cups) of water. For a larger supply, add  cup of bleach to 1 gallon (16 cups) of water. Read labels of cleaning products and follow recommendations provided on product labels. Labels contain  instructions for safe and effective use of the cleaning product including precautions you should take when applying the product, such as wearing gloves or eye protection and making sure you have good ventilation during use of the product. Remove gloves and wash hands immediately after cleaning.  Monitor yourself for signs and symptoms of illness Caregivers and household members are considered close contacts, should monitor their health, and will be asked to limit movement outside of the home to the extent possible. Follow the monitoring steps for close contacts listed on the symptom monitoring form.   ? If you have additional questions, contact your local health department or call the epidemiologist on call at [phone] (available 24/7). ? This guidance is subject to change. For the most up-to-date guidance from [facility], please refer to their website: YouBlogs.pl

## 2019-01-29 NOTE — ED Notes (Signed)
Patient transported to X-ray 

## 2019-01-30 LAB — NOVEL CORONAVIRUS, NAA (HOSP ORDER, SEND-OUT TO REF LAB; TAT 18-24 HRS): SARS-CoV-2, NAA: NOT DETECTED

## 2019-03-08 ENCOUNTER — Telehealth: Payer: Self-pay

## 2019-03-08 NOTE — Progress Notes (Signed)
HEMATOLOGY/ONCOLOGY CONSULTATION NOTE  Date of Service: 03/09/2019  Patient Care Team: Elwyn Reach, MD as PCP - General (Internal Medicine)  CHIEF COMPLAINTS/PURPOSE OF CONSULTATION:  Idiopathic Coagulopathy  HISTORY OF PRESENTING ILLNESS:   [name] [name] is a wonderful 68 y.o. male who has been referred to [place] by Dr. 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 The pt reports that he is doing well overall.   The pt has had multiple blood clots. 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 Subsequently had a right lower extremity DVT in 12/09/08 one month after a cholecystectomy. He then held coumadin for 7 days prior to a hernia surgery and developed a RLE superficial thrombosis. 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 He was then switched from Coumadin to Xarelto as of June 21, 2016. He has had a hypercoagulable profile which was unremarkable.  The pt reports that his first clots were very significant, and was bitten by one fire ant prior to his first clot while on delivery in [place], [place]. He notes that he was not able to come back to [facility] for three years after this.   The pt has one IVC filter in his chest, and one in his abdomen. He notes that after his chest IVC filter was placed he had a clot develop between this filter and his heart.   The pt notes that he has not had additional clots subsequent to switching to Xarelto in October 2017. He still has both of his IVC filters today. The pt denies any concerns for bleeding.  The pt notes that his right leg continues to become uncomfortable when he walks but denies leg swelling. He has worn knee high compression socks but has had some difficulty finding socks that don't cut off his  circulation too much.   The pt works as a Medical illustrator.  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 He also has chronic back pains and uses a tens unit.  Most recent lab results (01/29/19) of CBC w/diff and BMP is as follows: all values are WNL except for RBC at 6.07, MCV at 73.5, MCH at 23.6, RDW at 17.5, Potassium at 3.4, Creatinine at 1.37.  On review of systems, pt reports right leg discomfort when walking, stable energy levels, and denies concerns for infections, present abdominal pains, leg swelling, and any other symptoms.   On PMHx the pt reports multiple DVTs and Pulmonary emboli. On Social Hx the pt reports working as a Medical illustrator. On Family Hx the pt reports three daughters with Multiple sclerosis, one who died in 12-10-2011.   MEDICAL HISTORY:  Past Medical History:  Diagnosis Date  . Anxiety and depression   . Back pain   . Diverticulitis   . Diverticulosis   . DVT (deep venous thrombosis) ([place])   . Gout   . Hernia   . History of blood clots   . Hypertension   . Nausea and vomiting   . Neuropathy, peripheral 05/15/2012  . Shingles     SURGICAL HISTORY: Past Surgical History:  Procedure Laterality Date  . APPENDECTOMY    . BACK SURGERY    . CHOLECYSTECTOMY  12-10-06  . HERNIA REPAIR    .  HIATAL HERNIA REPAIR    . LEFT HEART CATHETERIZATION WITH CORONARY ANGIOGRAM N/A 08/02/2014   Procedure: LEFT HEART CATHETERIZATION WITH CORONARY ANGIOGRAM;  Surgeon: Laverda Page, MD;  Location: [facility] CATH LAB;  Service: Cardiovascular;  Laterality: N/A;  . MULTIPLE EXTRACTIONS WITH ALVEOLOPLASTY N/A 10/19/2015   Procedure: Extraction of tooth #'s 2,12,13 with alveoloplasty;  Surgeon: Lenn Cal, DDS;  Location: [place];  Service: Oral Surgery;  Laterality: N/A;  . SACRAL NERVE STIMULATOR PLACEMENT  2011    SOCIAL HISTORY: Social History   Socioeconomic History  . Marital status: Married    Spouse name: Media planner  . Number of children: 3  . Years of education: 12th  . Highest education level: Not on file  Occupational History  . Occupation: unemployed    Fish farm manager: OTHER    Employer: RETIRED  Social Needs  . Financial resource strain: Not on file  . Food insecurity    Worry: Not on file    Inability: Not on file  . Transportation needs    Medical: Not on file    Non-medical: Not on file  Tobacco Use  . Smoking status: Current Every Day Smoker    Packs/day: 0.50    Years: 40.00    Pack years: 20.00    Types: Cigarettes  . Smokeless tobacco: Never Used  Substance and Sexual Activity  . Alcohol use: Yes    Alcohol/week: 0.0 standard drinks    Comment: Occasional  . Drug use: Yes    Types: Marijuana  . Sexual activity: Not on file  Lifestyle  . Physical activity    Days per week: Not on file    Minutes per session: Not on file  . Stress: Not on file  Relationships  . Social Herbalist on phone: Not on file    Gets together: Not on file    Attends religious service: Not on file    Active member of club or organization: Not on file    Attends meetings of clubs or organizations: Not on file    Relationship status: Not on file  . Intimate partner violence    Fear of current or ex partner: Not on file    Emotionally abused: Not on file    Physically abused: Not on file    Forced sexual activity: Not on file  Other Topics Concern  . Not on file  Social History Narrative   Patient lives at home with his family.   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    Caffeine use: 1-2 cups daily    FAMILY HISTORY: Family History  Problem Relation Age of Onset  . Prostate cancer Father   . Heart disease Father   . Diabetes Mother   . Heart disease Mother   . Colon cancer Maternal Uncle        dx in his 36's  . Pancreatic cancer Paternal Uncle   . Prostate cancer Paternal Uncle   . Multiple sclerosis Daughter   . Prostate cancer Paternal Uncle      ALLERGIES:  is allergic to aspirin; celecoxib; ibuprofen; lyrica [pregabalin]; nsaids; and vicodin [hydrocodone-acetaminophen].  MEDICATIONS:  Current Outpatient Medications  Medication Sig Dispense Refill  . albuterol (PROVENTIL HFA;VENTOLIN HFA) 108 (90 Base) MCG/ACT inhaler Inhale 2 puffs into the lungs every 6 (six) hours as needed for wheezing or shortness of breath. 1 Inhaler 2  . amoxicillin-clavulanate (AUGMENTIN) 500-125 MG tablet Take 500 mg by  mouth 2 (two) times daily.  0  . cetirizine (ZYRTEC) 10 MG tablet Take 10 mg by mouth as needed.  3  . colchicine 0.6 MG tablet Take 1 tablet (0.6 mg total) by mouth 2 (two) times daily. Take 2 tablets for first dose then 1 pill twice daily for gout attack (Patient taking differently: Take 0.6 mg by mouth as needed. Take 2 tablets for first dose then 1 pill twice daily for gout attack ) 61 tablet 2  . dextromethorphan-guaiFENesin (MUCINEX DM) 30-600 MG 12hr tablet Take 1 tablet by mouth 2 (two) times daily. (Patient not taking: Reported on 03/28/2018) 30 tablet 0  . Diclofenac Sodium (PENNSAID) 2 % SOLN Place 2 Squirts onto the skin 2 (two) times daily. (Patient not taking: Reported on 03/28/2018) 1 Bottle 1  . doxycycline (VIBRAMYCIN) 100 MG capsule Take 1 capsule (100 mg total) by mouth 2 (two) times daily. 20 capsule 0  . DULoxetine (CYMBALTA) 60 MG capsule Take 120 mg by mouth daily.     . fluticasone (FLONASE) 50 MCG/ACT nasal spray Place 1 spray into both nostrils 2 (two) times daily.  0  . HYDROcodone-acetaminophen (NORCO/VICODIN) 5-325 MG tablet Take 2 tablets by mouth every 8 (eight) hours as needed. 9 tablet 0  . metoprolol tartrate (LOPRESSOR) 25 MG tablet Take 50 mg by mouth 2 (two) times daily.   0  . NIFEDICAL XL 60 MG 24 hr tablet Take 120 mg by mouth daily.     Marland Kitchen omeprazole (PRILOSEC) 20 MG capsule Take 20 mg by mouth daily.  3  . ondansetron (ZOFRAN) 4 MG tablet Take 1 tablet (4 mg total) by mouth every 8 (eight) hours as  needed for nausea or vomiting. (Patient not taking: Reported on 03/28/2018) 30 tablet 1  . oxyCODONE (OXY IR/ROXICODONE) 5 MG immediate release tablet Take 1 tablet (5 mg total) by mouth every 6 (six) hours as needed for severe pain. Add 5 mg (1 tablet) additional to your regular oxycodone dose for acute pain. 12 tablet 0  . oxyCODONE-acetaminophen (PERCOCET/ROXICET) 5-325 MG tablet Take 1 tablet by mouth every 6 (six) hours as needed for severe pain. (Patient not taking: Reported on 03/28/2018) 10 tablet 0  . rivaroxaban (XARELTO) 20 MG TABS tablet TAKE 1 TABLET BY MOUTH ONCE DAILY WITH SUPPER 90 tablet 0  . traMADol (ULTRAM) 50 MG tablet Take 1 tablet (50 mg total) by mouth every 6 (six) hours as needed. (Patient not taking: Reported on 03/28/2018) 15 tablet 0  . VOLTAREN 1 % GEL Apply 2 g topically daily as needed (pain). For neck and shoulder pain     No current facility-administered medications for this visit.     REVIEW OF SYSTEMS:    10 Point review of Systems was done is negative except as noted above.  PHYSICAL EXAMINATION  . Vitals:   03/09/19 1101  BP: (!) 147/86  Pulse: 75  Resp: 19  Temp: 98.2 F (36.8 C)  SpO2: 98%   Filed Weights   03/09/19 1101  Weight: 258 lb 4.8 oz (117.2 kg)   .Body mass index is 31.44 kg/m.  GENERAL:alert, in no acute distress and comfortable SKIN: no acute rashes, no significant lesions EYES: conjunctiva are pink and non-injected, sclera anicteric OROPHARYNX: MMM, no exudates, no oropharyngeal erythema or ulceration NECK: supple, no JVD LYMPH:  no palpable lymphadenopathy in the cervical, axillary or inguinal regions LUNGS: clear to auscultation b/l with normal respiratory effort HEART: regular rate & rhythm ABDOMEN:  normoactive bowel  sounds , non tender, not distended. Extremity: no pedal edema PSYCH: alert & oriented x 3 with fluent speech NEURO: no focal motor/sensory deficits  LABORATORY DATA:  I have reviewed the data as listed   . CBC Latest Ref Rng & Units 01/29/2019 09/17/2018 04/23/2018  WBC 4.0 - 10.5 K/uL 4.5 4.1 7.5  Hemoglobin 13.0 - 17.0 g/dL 14.3 13.5 13.8  Hematocrit 39.0 - 52.0 % 44.6 42.2 41.6  Platelets 150 - 400 K/uL 244 273 307   . CBC    Component Value Date/Time   WBC 4.5 01/29/2019 1931   RBC 6.07 (H) 01/29/2019 1931   HGB 14.3 01/29/2019 1931   HGB 13.5 09/17/2018 1057   HGB 13.1 04/04/2014 1110   HCT 44.6 01/29/2019 1931   HCT 42.2 09/17/2018 1057   HCT 41.7 04/04/2014 1110   PLT 244 01/29/2019 1931   PLT 273 09/17/2018 1057   MCV 73.5 (L) 01/29/2019 1931   MCV 73 (L) 09/17/2018 1057   MCV 72.4 (L) 04/04/2014 1110   MCH 23.6 (L) 01/29/2019 1931   MCHC 32.1 01/29/2019 1931   RDW 17.5 (H) 01/29/2019 1931   RDW 15.4 09/17/2018 1057   RDW 16.5 (H) 04/04/2014 1110   LYMPHSABS 1.7 01/29/2019 1931   LYMPHSABS 1.2 09/17/2018 1057   LYMPHSABS 1.5 04/04/2014 1110   MONOABS 0.4 01/29/2019 1931   MONOABS 0.5 04/04/2014 1110   EOSABS 0.1 01/29/2019 1931   EOSABS 0.1 09/17/2018 1057   BASOSABS 0.0 01/29/2019 1931   BASOSABS 0.0 09/17/2018 1057   BASOSABS 0.1 04/04/2014 1110    . CMP Latest Ref Rng & Units 01/29/2019 09/17/2018 04/23/2018  Glucose 70 - 99 mg/dL 98 95 105(H)  BUN 8 - 23 mg/dL 16 12 9   Creatinine 0.61 - 1.24 mg/dL 1.37(H) 1.02 1.14  Sodium 135 - 145 mmol/L 138 143 143  Potassium 3.5 - 5.1 mmol/L 3.4(L) 4.1 4.3  Chloride 98 - 111 mmol/L 100 101 103  CO2 22 - 32 mmol/L 31 25 32  Calcium 8.9 - 10.3 mg/dL 9.3 9.6 9.9  Total Protein 6.5 - 8.1 g/dL - - 7.2  Total Bilirubin 0.3 - 1.2 mg/dL - - 0.8  Alkaline Phos 38 - 126 U/L - - 48  AST 15 - 41 U/L - - 15  ALT 0 - 44 U/L - - 12     RADIOGRAPHIC STUDIES: I have personally reviewed the radiological images as listed and agreed with the findings in the report. No results found.  ASSESSMENT & PLAN:  68 y.o. male with  1. 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 No anemia. Normal WBC and PLT. -Continue lifelong blood thinners. Continue 20mg  Xarelto with PCP. -Recommended wearing compression socks regularly. -Recommend staying well hydrated and ambulating periodically while on long distance travel. -Will be happy to see this pt back for questions of holding his blood thinners peri-operatively in the future as necessary -Will see the pt back in one year   RTC with Dr Irene Limbo in 12 months with labs   All of the patients questions were answered with apparent satisfaction. The patient knows to call the clinic with any problems, questions or concerns.  The total time spent in the appt was 35 minutes and more than 50% was on counseling and direct patient cares.    Sullivan Lone MD [place] AAHIVMS [facility] Hematology/Oncology Physician  [place]  (Office):       [phone] (Work cell):  [phone] (Fax):           [phone]  03/09/2019 12:00 PM  I, Baldwin Jamaica, am acting as a scribe for Dr. Sullivan Lone.   .I have reviewed the above documentation for accuracy and completeness, and I agree with the above. Brunetta Genera MD

## 2019-03-08 NOTE — Telephone Encounter (Signed)
Called and left voicemail regarding pre-screening questions for appt on 6/30 

## 2019-03-09 ENCOUNTER — Other Ambulatory Visit: Payer: Self-pay

## 2019-03-09 ENCOUNTER — Inpatient Hospital Stay: Payer: Medicare Other | Attending: Hematology | Admitting: Hematology

## 2019-03-09 ENCOUNTER — Telehealth: Payer: Self-pay | Admitting: Hematology

## 2019-03-09 ENCOUNTER — Inpatient Hospital Stay: Payer: Medicare Other

## 2019-03-09 VITALS — BP 147/86 | HR 75 | Temp 98.2°F | Resp 19 | Ht 76.0 in | Wt 258.3 lb

## 2019-03-09 DIAGNOSIS — I82503 Chronic embolism and thrombosis of unspecified deep veins of lower extremity, bilateral: Secondary | ICD-10-CM

## 2019-03-09 DIAGNOSIS — I2782 Chronic pulmonary embolism: Secondary | ICD-10-CM

## 2019-03-09 DIAGNOSIS — G8929 Other chronic pain: Secondary | ICD-10-CM

## 2019-03-09 DIAGNOSIS — D689 Coagulation defect, unspecified: Secondary | ICD-10-CM | POA: Diagnosis not present

## 2019-03-09 DIAGNOSIS — Z7901 Long term (current) use of anticoagulants: Secondary | ICD-10-CM | POA: Insufficient documentation

## 2019-03-09 DIAGNOSIS — Z86718 Personal history of other venous thrombosis and embolism: Secondary | ICD-10-CM | POA: Diagnosis not present

## 2019-03-09 DIAGNOSIS — M549 Dorsalgia, unspecified: Secondary | ICD-10-CM

## 2019-03-09 DIAGNOSIS — Z86711 Personal history of pulmonary embolism: Secondary | ICD-10-CM

## 2019-03-09 DIAGNOSIS — F17210 Nicotine dependence, cigarettes, uncomplicated: Secondary | ICD-10-CM | POA: Insufficient documentation

## 2019-03-09 DIAGNOSIS — B0229 Other postherpetic nervous system involvement: Secondary | ICD-10-CM | POA: Insufficient documentation

## 2019-03-09 NOTE — Telephone Encounter (Signed)
Scheduled appt per 6/30 los. Spoke with patient and patient aware of appt date and time.  Rescheduled patient lab appt that was scheduled for 6/30.  Patient aware of new lab appt date and time.

## 2019-03-10 ENCOUNTER — Inpatient Hospital Stay: Payer: Medicare Other

## 2019-03-10 ENCOUNTER — Other Ambulatory Visit: Payer: Self-pay

## 2019-04-27 ENCOUNTER — Other Ambulatory Visit: Payer: Self-pay | Admitting: Hematology

## 2019-04-27 DIAGNOSIS — I82401 Acute embolism and thrombosis of unspecified deep veins of right lower extremity: Secondary | ICD-10-CM

## 2019-04-27 DIAGNOSIS — Z7901 Long term (current) use of anticoagulants: Secondary | ICD-10-CM

## 2019-04-28 ENCOUNTER — Telehealth: Payer: Self-pay | Admitting: *Deleted

## 2019-04-28 ENCOUNTER — Encounter: Payer: Self-pay | Admitting: Gastroenterology

## 2019-04-28 NOTE — Telephone Encounter (Signed)
  Patient called for refill of Xarelto. States he is out of Xarelto. Past pt of [name]. His appt w/Dr. Irene Limbo was 6/30, to RTC to see Dr. Irene Limbo  in 12 months. In OV note, he is to f/u with PCP for Xarelto and copy of progress note was cc'd to PCP, Dr. Jonelle Sidle.  Contacted patient with Dr. Grier Mitts response: Would recommend follow-up with PCP for continued long-term Xarelto. If PCP is not contactableand he is running out of medications -refill one time for 1 month.  Patient states he does not currently have an appt with his PCP for f/u but will make one as soon as possible. Patient states he does need a month's supply of Xarelto. Refill sent to pharmacy.

## 2019-05-27 ENCOUNTER — Other Ambulatory Visit: Payer: Self-pay | Admitting: Hematology

## 2019-05-27 DIAGNOSIS — I82401 Acute embolism and thrombosis of unspecified deep veins of right lower extremity: Secondary | ICD-10-CM

## 2019-05-27 DIAGNOSIS — Z7901 Long term (current) use of anticoagulants: Secondary | ICD-10-CM

## 2019-06-03 ENCOUNTER — Ambulatory Visit (INDEPENDENT_AMBULATORY_CARE_PROVIDER_SITE_OTHER): Payer: Medicare Other | Admitting: Nurse Practitioner

## 2019-06-03 ENCOUNTER — Encounter: Payer: Self-pay | Admitting: Nurse Practitioner

## 2019-06-03 ENCOUNTER — Telehealth: Payer: Self-pay

## 2019-06-03 ENCOUNTER — Other Ambulatory Visit (INDEPENDENT_AMBULATORY_CARE_PROVIDER_SITE_OTHER): Payer: Medicare Other

## 2019-06-03 VITALS — BP 122/84 | HR 70 | Temp 97.7°F | Ht 76.0 in | Wt 249.2 lb

## 2019-06-03 DIAGNOSIS — R109 Unspecified abdominal pain: Secondary | ICD-10-CM | POA: Diagnosis not present

## 2019-06-03 DIAGNOSIS — R718 Other abnormality of red blood cells: Secondary | ICD-10-CM

## 2019-06-03 DIAGNOSIS — Z7901 Long term (current) use of anticoagulants: Secondary | ICD-10-CM

## 2019-06-03 DIAGNOSIS — K529 Noninfective gastroenteritis and colitis, unspecified: Secondary | ICD-10-CM | POA: Diagnosis not present

## 2019-06-03 DIAGNOSIS — G8929 Other chronic pain: Secondary | ICD-10-CM

## 2019-06-03 DIAGNOSIS — Z860100 Personal history of colon polyps, unspecified: Secondary | ICD-10-CM

## 2019-06-03 LAB — IBC + FERRITIN
Ferritin: 23.4 ng/mL (ref 22.0–322.0)
Iron: 97 ug/dL (ref 42–165)
Saturation Ratios: 26 % (ref 20.0–50.0)
Transferrin: 266 mg/dL (ref 212.0–360.0)

## 2019-06-03 MED ORDER — NA SULFATE-K SULFATE-MG SULF 17.5-3.13-1.6 GM/177ML PO SOLN: ORAL | 0 refills | Status: DC

## 2019-06-03 NOTE — Progress Notes (Signed)
ASSESSMENT / PLAN:   43.  68 year old male with one year history of diarrhea and intermittent painless rectal bleeding.  -Polyp surveillance colonoscopy due in 2 years but in light of these bowel changes / bleeding will proceed earlier. The risks and benefits of colonoscopy with possible polypectomy / biopsies were discussed and the patient agrees to proceed.   2. Chronic abdominal pain dating back nearly 15 years. Pain mainly postprandial. Extensive workup by Dr. Deatra Ina  a few years ago was unrevealing.  3.  Hx of colon polyps, surveillance colonoscopy due Sept 2022 (Dr. Loletha Carrow) but proceeding earlier due to #1    4. Microcytosis in absence of anemia.  -check ferritin, TIBC  5. Hx of DVT, on Xarelto -Hold Xarelto for 2 days before procedure - will instruct when and how to resume after procedure. Patient understands that there is a low but real risk of cardiovascular event such as heart attack, stroke, or embolism /  thrombosis while off blood thinner. The patient consents to proceed. Will communicate by phone or EMR with patient's prescribing provider to confirm that holding Xarelto is reasonable in this case.    HPI:    Referring Provider:   Gala Romney, MD    Reason for referral:   Stomach pain and blood in bowels  Chief Complaint:   Abdominal pain and rectal bleeding  Patient is a 67 year old male with history of DVT on Xarelto, gout, diverticulitis, hiatal hernia repair, cholecystectomy, chronic abdominal pain and colon polyps.  He has chronic (12-15 years) postprandial abdominal pain evaluated by Dr. Deatra Ina a few years ago .  Pain seems to start in the RUQ, radiate across abdomen down into left lower quadrant.  Pain is nonradiating, he describes it as stabbing in nature and usually occurs after eating. He has had CT scan, both upper and lower endoscopies and CT angio which was negative for any significant vascular disease.  Gastric emptying study normal. 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  He has sensitive "spots" in right upper abdomen and back.  No one is ever been able to tell him what these spots are but they get very sore at times  [place] has had 3 episodes of painless rectal bleeding over this last year.  Last episode of bleeding was approximately 1 month ago.  Years ago he struggled with constipation but over the last year his stools have been loose. He averages anywhere from 1-3 loose bowel movements a day, seems like the only time he has a solid stool is after taking Imodium.    Past Medical History:  Diagnosis Date   Anxiety and depression    Back pain    Diverticulitis    Diverticulosis    DVT (deep venous thrombosis) ([facility])    Gout    Hernia    History of blood clots    Hypertension    Nausea and vomiting    Neuropathy, peripheral 05/15/2012   Shingles      Past Surgical History:  Procedure Laterality Date   APPENDECTOMY     BACK SURGERY     CHOLECYSTECTOMY  2008   HERNIA REPAIR     HIATAL HERNIA REPAIR     LEFT HEART CATHETERIZATION WITH CORONARY ANGIOGRAM N/A 08/02/2014   Procedure: LEFT HEART CATHETERIZATION WITH CORONARY ANGIOGRAM;  Surgeon: Laverda Page, MD;  Location: [facility] CATH LAB;  Service: Cardiovascular;  Laterality: N/A;  MULTIPLE EXTRACTIONS WITH ALVEOLOPLASTY N/A 10/19/2015   Procedure: Extraction of tooth #'s 2,12,13 with alveoloplasty;  Surgeon: Lenn Cal, DDS;  Location: [place];  Service: Oral Surgery;  Laterality: N/A;   SACRAL NERVE STIMULATOR PLACEMENT  2011   Family History  Problem Relation Age of Onset   Prostate cancer Father    Heart disease Father    Diabetes Mother    Heart disease Mother    Colon cancer Maternal Uncle        dx in his 8's   Pancreatic cancer Paternal Uncle    Prostate cancer Paternal Uncle    Multiple sclerosis Daughter    Prostate cancer Paternal Uncle    Social History   Tobacco Use   Smoking status: Current Every Day Smoker     Packs/day: 0.50    Years: 40.00    Pack years: 20.00    Types: Cigarettes   Smokeless tobacco: Never Used  Substance Use Topics   Alcohol use: Yes    Alcohol/week: 0.0 standard drinks    Comment: Occasional   Drug use: Yes    Types: Marijuana   Current Outpatient Medications  Medication Sig Dispense Refill   albuterol (PROVENTIL HFA;VENTOLIN HFA) 108 (90 Base) MCG/ACT inhaler Inhale 2 puffs into the lungs every 6 (six) hours as needed for wheezing or shortness of breath. 1 Inhaler 2   cetirizine (ZYRTEC) 10 MG tablet Take 10 mg by mouth as needed.  3   doxycycline (VIBRAMYCIN) 100 MG capsule Take 1 capsule (100 mg total) by mouth 2 (two) times daily. 20 capsule 0   DULoxetine (CYMBALTA) 60 MG capsule Take 120 mg by mouth daily.      fluticasone (FLONASE) 50 MCG/ACT nasal spray Place 1 spray into both nostrils 2 (two) times daily.  0   metoprolol tartrate (LOPRESSOR) 25 MG tablet Take 50 mg by mouth 2 (two) times daily.   0   NIFEDICAL XL 60 MG 24 hr tablet Take 120 mg by mouth daily.      omeprazole (PRILOSEC) 20 MG capsule Take 20 mg by mouth daily.  3   ondansetron (ZOFRAN) 4 MG tablet Take 1 tablet (4 mg total) by mouth every 8 (eight) hours as needed for nausea or vomiting. 30 tablet 1   oxyCODONE (OXY IR/ROXICODONE) 5 MG immediate release tablet Take 1 tablet (5 mg total) by mouth every 6 (six) hours as needed for severe pain. Add 5 mg (1 tablet) additional to your regular oxycodone dose for acute pain. 12 tablet 0   VOLTAREN 1 % GEL Apply 2 g topically daily as needed (pain). For neck and shoulder pain     XARELTO 20 MG TABS tablet TAKE 1 TABLET BY MOUTH EVERY DAY WITH SUPPER 30 tablet 0   Na Sulfate-K Sulfate-Mg Sulf 17.5-3.13-1.6 GM/177ML SOLN Suprep-Use as directed 354 mL 0   No current facility-administered medications for this visit.    Allergies  Allergen Reactions   Aspirin Other (See Comments)    ulcers   Celecoxib Other (See Comments)    Stomach  irritation   Ibuprofen Other (See Comments)    ulcers   Lyrica [Pregabalin] Swelling   Nsaids    Vicodin [Hydrocodone-Acetaminophen]     Makes me mean      Review of Systems: All systems reviewed and negative except where noted in HPI.   Physical Exam:    Wt Readings from Last 3 Encounters:  06/03/19 249 lb 4 oz (113.1 kg)  03/09/19 258 lb  4.8 oz (117.2 kg)  06/01/18 264 lb 1.6 oz (119.8 kg)    BP 122/84 (BP Location: Left Arm, Patient Position: Sitting, Cuff Size: Normal)    Pulse 70    Temp 97.7 F (36.5 C) (Other (Comment))    Ht 6\' 4"  (1.93 m)    Wt 249 lb 4 oz (113.1 kg)    BMI 30.34 kg/m  Constitutional:  Pleasant male in no acute distress. Psychiatric: Normal mood and affect. Behavior is normal. EENT: Pupils normal.  Conjunctivae are normal. No scleral icterus. Neck supple.  Cardiovascular: Normal rate, regular rhythm. No edema Pulmonary/chest: Effort normal and breath sounds normal. No wheezing, rales or rhonchi. Abdominal: Soft, nondistended, nontender. Bowel sounds active throughout. There are no masses palpable. No hepatomegaly. Neurological: Alert and oriented to person place and time. Skin: Skin is warm and dry. No rashes noted.  Tye Savoy, NP  06/03/2019, 4:27 PM  Cc: Elwyn Reach, MD

## 2019-06-03 NOTE — Telephone Encounter (Signed)
[facility] [address] [place], [place]  [address] Phone:  [phone]   Fax:  [phone]   06/03/2019   RE:      [name] [name] DOB:   04-18-1951 MRN:   [id number]   Dear Dr. Jonelle Sidle,    We have scheduled the above patient for an endoscopic procedure. Our records show that he is on anticoagulation therapy.   Please advise as to whether the patient may come off his therapy of Xarelto two days prior to the colonoscopy procedure, which is scheduled for 06/29/19.  Please fax this back to [place], [place] at [phone].   Sincerely,    Thurmon Fair, RMA

## 2019-06-03 NOTE — Progress Notes (Signed)
____________________________________________________________  Attending physician addendum:  Thank you for sending this case to me. I have reviewed the entire note, and the outlined plan seems appropriate.  Kyrielle Urbanski Danis, MD  ____________________________________________________________  

## 2019-06-03 NOTE — Patient Instructions (Signed)
If you are age 69 or older, your body mass index should be between 23-30. Your Body mass index is 30.34 kg/m. If this is out of the aforementioned range listed, please consider follow up with your Primary Care Provider.  If you are age 46 or younger, your body mass index should be between 19-25. Your Body mass index is 30.34 kg/m. If this is out of the aformentioned range listed, please consider follow up with your Primary Care Provider.   You have been scheduled for a colonoscopy. Please follow written instructions given to you at your visit today.  Please pick up your prep supplies at the pharmacy within the next 1-3 days. If you use inhalers (even only as needed), please bring them with you on the day of your procedure. Your physician has requested that you go to www.startemmi.com and enter the access code given to you at your visit today. This web site gives a general overview about your procedure. However, you should still follow specific instructions given to you by our office regarding your preparation for the procedure.  We have sent the following medications to your pharmacy for you to pick up at your convenience: [place] provider has requested that you go to the basement level for lab work before leaving today. Press "B" on the elevator. The lab is located at the first door on the left as you exit the elevator.  You will be contacted by our office prior to your procedure for directions on holding your Xarelto.  If you do not hear from our office 1 week prior to your scheduled procedure, please call [phone] to discuss.   Thank you for choosing me and [facility].   Tye Savoy, NP

## 2019-06-17 ENCOUNTER — Telehealth: Payer: Self-pay | Admitting: Gastroenterology

## 2019-06-23 NOTE — Telephone Encounter (Signed)
Called patient to let him know that per Dr. Jonelle Sidle, okay to hold Xarelto two days prior to colonoscopy.  Patient verbalized understanding.  Letter from Dr. Jonelle Sidle scanned to chart.

## 2019-06-24 ENCOUNTER — Encounter: Payer: Self-pay | Admitting: Gastroenterology

## 2019-06-28 ENCOUNTER — Telehealth: Payer: Self-pay

## 2019-06-28 NOTE — Telephone Encounter (Signed)
Covid-19 screening questions   Do you now or have you had a fever in the last 14 days?  Do you have any respiratory symptoms of shortness of breath or cough now or in the last 14 days?  Do you have any family members or close contacts with diagnosed or suspected Covid-19 in the past 14 days?  Have you been tested for Covid-19 and found to be positive?       

## 2019-06-29 ENCOUNTER — Other Ambulatory Visit: Payer: Self-pay

## 2019-06-29 ENCOUNTER — Ambulatory Visit (AMBULATORY_SURGERY_CENTER): Payer: Medicare Other | Admitting: Gastroenterology

## 2019-06-29 ENCOUNTER — Encounter: Payer: Self-pay | Admitting: Gastroenterology

## 2019-06-29 VITALS — BP 161/88 | HR 72 | Temp 98.0°F | Resp 21 | Ht 76.0 in | Wt 249.0 lb

## 2019-06-29 DIAGNOSIS — K625 Hemorrhage of anus and rectum: Secondary | ICD-10-CM | POA: Diagnosis not present

## 2019-06-29 DIAGNOSIS — K529 Noninfective gastroenteritis and colitis, unspecified: Secondary | ICD-10-CM | POA: Diagnosis not present

## 2019-06-29 DIAGNOSIS — D120 Benign neoplasm of cecum: Secondary | ICD-10-CM

## 2019-06-29 MED ORDER — SODIUM CHLORIDE 0.9 % IV SOLN: 500.0000 mL | Freq: Once | INTRAVENOUS | Status: DC

## 2019-06-29 NOTE — Patient Instructions (Signed)
YOU HAD AN ENDOSCOPIC PROCEDURE TODAY AT [facility]:   Refer to the procedure report that was given to you for any specific questions about what was found during the examination.  If the procedure report does not answer your questions, please call your gastroenterologist to clarify.  If you requested that your care partner not be given the details of your procedure findings, then the procedure report has been included in a sealed envelope for you to review at your convenience later.  YOU SHOULD EXPECT: Some feelings of bloating in the abdomen. Passage of more gas than usual.  Walking can help get rid of the air that was put into your GI tract during the procedure and reduce the bloating. If you had a lower endoscopy (such as a colonoscopy or flexible sigmoidoscopy) you may notice spotting of blood in your stool or on the toilet paper. If you underwent a bowel prep for your procedure, you may not have a normal bowel movement for a few days.  Please Note:  You might notice some irritation and congestion in your nose or some drainage.  This is from the oxygen used during your procedure.  There is no need for concern and it should clear up in a day or so.  SYMPTOMS TO REPORT IMMEDIATELY:   Following lower endoscopy (colonoscopy or flexible sigmoidoscopy):  Excessive amounts of blood in the stool  Significant tenderness or worsening of abdominal pains  Swelling of the abdomen that is new, acute  Fever of 100F or higher  For urgent or emergent issues, a gastroenterologist can be reached at any hour by calling [phone].   DIET:  We do recommend a small meal at first, but then you may proceed to your regular diet.  Drink plenty of fluids but you should avoid alcoholic beverages for 24 hours.  ACTIVITY:  You should plan to take it easy for the rest of today and you should NOT DRIVE or use heavy machinery until tomorrow (because of the sedation medicines used during the test).     FOLLOW UP: Our staff will call the number listed on your records 48-72 hours following your procedure to check on you and address any questions or concerns that you may have regarding the information given to you following your procedure. If we do not reach you, we will leave a message.  We will attempt to reach you two times.  During this call, we will ask if you have developed any symptoms of COVID 19. If you develop any symptoms (ie: fever, flu-like symptoms, shortness of breath, cough etc.) before then, please call [phone].  If you test positive for Covid 19 in the 2 weeks post procedure, please call and report this information to [place].    If any biopsies were taken you will be contacted by phone or by letter within the next 1-3 weeks.  Please call us at [phone] if you have not heard about the biopsies in 3 weeks.    SIGNATURES/CONFIDENTIALITY: You and/or your care partner have signed paperwork which will be entered into your electronic medical record.  These signatures attest to the fact that that the information above on your After Visit Summary has been reviewed and is understood.  Full responsibility of the confidentiality of this discharge information lies with you and/or your care-partner.     Handout was given to you on polyps. Resume your XARELTO at prior dose in 2 days. (Thursday). You may resume your other current medications  today. Await biopsy results. Please call if any questions or concerns.

## 2019-06-29 NOTE — Progress Notes (Signed)
Temp check by JB. Vital check by [place].  Reviewed and updated medical and surgical history with patient.

## 2019-06-29 NOTE — Progress Notes (Signed)
A/ox3, pleased with MAC, report to RN 

## 2019-06-29 NOTE — Op Note (Addendum)
[facility] [place] Patient Name: [name] [name] Procedure Date: 06/29/2019 2:07 PM MRN: [id number] Endoscopist: Mallie Mussel L. Loletha Carrow , MD Age: 68 Referring MD:  Date of Birth: 01-07-1951 Gender: Male Account #: [id number] Procedure:                Colonoscopy Indications:              Rectal bleeding, Chronic diarrhea Medicines:                Monitored Anesthesia Care Procedure:                Pre-Anesthesia Assessment:                           - Prior to the procedure, a History and Physical                            was performed, and patient medications and                            allergies were reviewed. The patient's tolerance of                            previous anesthesia was also reviewed. The risks                            and benefits of the procedure and the sedation                            options and risks were discussed with the patient.                            All questions were answered, and informed consent                            was obtained. Prior Anticoagulants: The patient has                            taken Xarelto (rivaroxaban), last dose was 2 days                            prior to procedure. ASA Grade Assessment: III - A                            patient with severe systemic disease. After                            reviewing the risks and benefits, the patient was                            deemed in satisfactory condition to undergo the                            procedure.  After obtaining informed consent, the colonoscope                            was passed under direct vision. Throughout the                            procedure, the patient's blood pressure, pulse, and                            oxygen saturations were monitored continuously. The                            Colonoscope was introduced through the anus and                            advanced to the the cecum, identified by        appendiceal orifice and ileocecal valve (the                            terminal ileum could not be intubated due to scope                            looping). The colonoscopy was performed without                            difficulty. The patient tolerated the procedure                            well. The quality of the bowel preparation was                            fair. The ileocecal valve, appendiceal orifice, and                            rectum were photographed. The bowel preparation                            used was SUPREP. Scope In: 2:12:16 PM Scope Out: 2:31:45 PM Scope Withdrawal Time: 0 hours 15 minutes 15 seconds  Total Procedure Duration: 0 hours 19 minutes 29 seconds  Findings:                 The perianal and digital rectal examinations were                            normal.                           Normal mucosa was found in the entire colon.                            Biopsies for histology were taken with a cold                            forceps from  the right colon and left colon for                            evaluation of microscopic colitis.                           An 8-10 mm polyp was found in the cecum. The polyp                            was sessile. The polyp was removed with a piecemeal                            technique using a cold snare. Resection and                            retrieval were complete.                           The exam was otherwise without abnormality on                            direct and retroflexion views. Complications:            No immediate complications. Estimated Blood Loss:     Estimated blood loss was minimal. Impression:               - Preparation of the colon was fair.                           - Normal mucosa in the entire examined colon.                            Biopsied.                           - One 8 mm polyp in the cecum, removed piecemeal                            using a cold snare.  Resected and retrieved.                           - The examination was otherwise normal on direct                            and retroflexion views.                           Benign anal bleeding exacerbated by oral                            anticoagulation. Recommendation:           - Patient has a contact number available for                            emergencies. The signs and symptoms of potential  delayed complications were discussed with the                            patient. Return to normal activities tomorrow.                            Written discharge instructions were provided to the                            patient.                           - Resume previous diet.                           - Continue present medications.                           - Await pathology results.                           - Repeat colonoscopy is recommended for                            surveillance. The colonoscopy date will be                            determined after pathology results from today's                            exam become available for review. Consume                            additional water with bowel prep for next procedure                            (to improve quality of preparation).                           - Resume Xarelto (rivaroxaban) at prior dose in 2                            days. Henry L. Loletha Carrow, MD 06/29/2019 2:37:37 PM This report has been signed electronically.

## 2019-06-29 NOTE — Progress Notes (Signed)
Called to room to assist during endoscopic procedure.  Patient ID and intended procedure confirmed with present staff. Received instructions for my participation in the procedure from the performing physician.  

## 2019-06-29 NOTE — Progress Notes (Signed)
No problems noted in the recovery room. maw 

## 2019-07-01 ENCOUNTER — Telehealth: Payer: Self-pay

## 2019-07-01 NOTE — Telephone Encounter (Signed)
2nd follow up call attempted.  NALM

## 2019-07-01 NOTE — Telephone Encounter (Signed)
First post procedure follow up call, no answer 

## 2019-07-02 ENCOUNTER — Other Ambulatory Visit: Payer: Self-pay | Admitting: Hematology

## 2019-07-02 DIAGNOSIS — Z7901 Long term (current) use of anticoagulants: Secondary | ICD-10-CM

## 2019-07-02 DIAGNOSIS — I82401 Acute embolism and thrombosis of unspecified deep veins of right lower extremity: Secondary | ICD-10-CM

## 2019-07-05 ENCOUNTER — Encounter: Payer: Self-pay | Admitting: Gastroenterology

## 2019-07-06 ENCOUNTER — Other Ambulatory Visit: Payer: Self-pay | Admitting: *Deleted

## 2019-07-06 MED ORDER — DICYCLOMINE HCL 10 MG PO CAPS: 10.0000 mg | ORAL_CAPSULE | Freq: Three times a day (TID) | ORAL | 1 refills | Status: DC | PRN

## 2019-07-20 ENCOUNTER — Ambulatory Visit: Payer: Medicare Other | Admitting: Nurse Practitioner

## 2019-07-22 ENCOUNTER — Ambulatory Visit: Payer: Medicare Other | Admitting: Physician Assistant

## 2019-08-03 ENCOUNTER — Encounter: Payer: Self-pay | Admitting: Physician Assistant

## 2019-08-03 ENCOUNTER — Ambulatory Visit: Payer: Medicare Other | Admitting: Physician Assistant

## 2019-08-03 ENCOUNTER — Other Ambulatory Visit: Payer: Self-pay

## 2019-08-03 VITALS — BP 122/82 | HR 86 | Temp 98.2°F | Ht 76.0 in | Wt 242.0 lb

## 2019-08-03 DIAGNOSIS — R229 Localized swelling, mass and lump, unspecified: Secondary | ICD-10-CM | POA: Diagnosis not present

## 2019-08-03 DIAGNOSIS — R1032 Left lower quadrant pain: Secondary | ICD-10-CM | POA: Diagnosis not present

## 2019-08-03 MED ORDER — METRONIDAZOLE 250 MG PO TABS: 250.0000 mg | ORAL_TABLET | Freq: Three times a day (TID) | ORAL | 0 refills | Status: DC

## 2019-08-03 MED ORDER — CIPROFLOXACIN HCL 500 MG PO TABS: 500.0000 mg | ORAL_TABLET | Freq: Two times a day (BID) | ORAL | 0 refills | Status: AC

## 2019-08-03 MED ORDER — CIPROFLOXACIN HCL 500 MG PO TABS: 500.0000 mg | ORAL_TABLET | Freq: Two times a day (BID) | ORAL | 0 refills | Status: DC

## 2019-08-03 MED ORDER — METRONIDAZOLE 250 MG PO TABS: 250.0000 mg | ORAL_TABLET | Freq: Three times a day (TID) | ORAL | 0 refills | Status: AC

## 2019-08-03 NOTE — Patient Instructions (Addendum)
If you are age 69 or older, your body mass index should be between 23-30. Your Body mass index is 29.46 kg/m. If this is out of the aforementioned range listed, please consider follow up with your Primary Care Provider.  If you are age 29 or younger, your body mass index should be between 19-25. Your Body mass index is 29.46 kg/m. If this is out of the aformentioned range listed, please consider follow up with your Primary Care Provider.   We have sent the following medications to your pharmacy for you to pick up at your convenience:  1. Cipro 500 mg twice daily for 7 days.   2. Flagyl 500 mg three times daily for 7 days.    Referral placed to dermatology.  Handout given for a Low Fiber/Low Residue Diet.

## 2019-08-03 NOTE — Progress Notes (Signed)
____________________________________________________________  Attending physician addendum:  Thank you for sending this case to me. I have reviewed the entire note, and the outlined plan seems appropriate.  No diverticulosis noted on recent colonoscopy.  I believe this patient has functional abdominal pain.  Wilfrid Lund, MD  ____________________________________________________________

## 2019-08-03 NOTE — Progress Notes (Signed)
Chief Complaint: Follow-up colonoscopy with abdominal pain  HPI:    [name] [name] is a 68 year old African-American male with a past medical history as listed below, known to Dr. Rachael Darby for recent colonoscopy, who presents to clinic today for follow-up after colonoscopy with some abdominal pain.      06/29/2019 colonoscopy completed for rectal bleeding and chronic diarrhea, the preparation of the colon was fair, normal mucosa in the entire examined colon, one 8 mm polyp in the cecum and otherwise normal exam.  It was thought that benign anal bleeding was exacerbated by oral anticoagulation.  Pathology showed adenomatous polyps.  Repeat call recommended in 3 years.  There are also no microscopic colitis.  Patient was started on dicyclomine 10 mg 1 tablet 2-3 times a day as needed for diarrhea.  Is recommended he use this instead of and not in addition to Imodium.    Today, the patient tells me that about once a month he will get left lower quadrant abdominal pain.  He tells me it is when he is having bad sinus problems because it looks like his stool becomes mucousy.   Pain is a 10 /10 and located just in the left lower quadrant with no radiation.  It is sometimes worse with a bowel movement/shortly after.  Just recently the patient took 3 leftover Penicillin he had from a dental procedure and feels like this helps the pain.  Tells me that eating salad seems to make this pain worse.  Denies any blood in his stool.  Currently using Dicyclomine 10 mg 4 times daily as needed which is helping.    Vague description of hospitalization for this pain as above when he was out of town, apparently treated him for diverticulitis with IV antibiotics and he tells me that he "felt like a new man for a long time".    Also describes today that he has some painful "nodules under my skin", this is over his epigastrium.  Tells me it sends a sharp pain "right to my soul" when he presses on them.  Apparently he told his PCP about  this but they have not offered him a solution.    Denies fever, chills, weight loss, anorexia, nausea, vomiting or symptoms that awaken him from sleep.     Past Medical History:  Diagnosis Date  . Anxiety and depression   . Back pain   . Diverticulitis   . Diverticulosis   . DVT (deep venous thrombosis) ([place])   . Gout   . Hernia   . History of blood clots   . Hypertension   . Nausea and vomiting   . Neuropathy, peripheral 05/15/2012   patient denies  . Shingles     Past Surgical History:  Procedure Laterality Date  . APPENDECTOMY    . BACK SURGERY    . CHOLECYSTECTOMY  2008  . HERNIA REPAIR    . HIATAL HERNIA REPAIR    . LEFT HEART CATHETERIZATION WITH CORONARY ANGIOGRAM N/A 08/02/2014   Procedure: LEFT HEART CATHETERIZATION WITH CORONARY ANGIOGRAM;  Surgeon: Laverda Page, MD;  Location: [facility] CATH LAB;  Service: Cardiovascular;  Laterality: N/A;  . MULTIPLE EXTRACTIONS WITH ALVEOLOPLASTY N/A 10/19/2015   Procedure: Extraction of tooth #'s 2,12,13 with alveoloplasty;  Surgeon: Lenn Cal, DDS;  Location: [facility];  Service: Oral Surgery;  Laterality: N/A;  . SACRAL NERVE STIMULATOR PLACEMENT  2011    Current Outpatient Medications  Medication Sig Dispense Refill  . albuterol (PROVENTIL HFA;VENTOLIN HFA) 108 (  90 Base) MCG/ACT inhaler Inhale 2 puffs into the lungs every 6 (six) hours as needed for wheezing or shortness of breath. 1 Inhaler 2  . cetirizine (ZYRTEC) 10 MG tablet Take 10 mg by mouth as needed.  3  . dicyclomine (BENTYL) 10 MG capsule Take 1 capsule (10 mg total) by mouth 3 (three) times daily as needed for spasms. 60 capsule 1  . doxycycline (VIBRAMYCIN) 100 MG capsule Take 1 capsule (100 mg total) by mouth 2 (two) times daily. (Patient not taking: Reported on 06/29/2019) 20 capsule 0  . DULoxetine (CYMBALTA) 60 MG capsule Take 120 mg by mouth daily.     . fluticasone (FLONASE) 50 MCG/ACT nasal spray Place 1 spray into both nostrils 2 (two) times daily.  0  .  metoprolol tartrate (LOPRESSOR) 25 MG tablet Take 50 mg by mouth 2 (two) times daily.   0  . NIFEDICAL XL 60 MG 24 hr tablet Take 120 mg by mouth daily.     Marland Kitchen omeprazole (PRILOSEC) 20 MG capsule Take 20 mg by mouth daily.  3  . ondansetron (ZOFRAN) 4 MG tablet Take 1 tablet (4 mg total) by mouth every 8 (eight) hours as needed for nausea or vomiting. (Patient not taking: Reported on 06/29/2019) 30 tablet 1  . oxyCODONE (OXY IR/ROXICODONE) 5 MG immediate release tablet Take 1 tablet (5 mg total) by mouth every 6 (six) hours as needed for severe pain. Add 5 mg (1 tablet) additional to your regular oxycodone dose for acute pain. 12 tablet 0  . VOLTAREN 1 % GEL Apply 2 g topically daily as needed (pain). For neck and shoulder pain    . XARELTO 20 MG TABS tablet TAKE 1 TABLET BY MOUTH EVERY DAY WITH SUPPER 30 tablet 0   No current facility-administered medications for this visit.     Allergies as of 08/03/2019 - Review Complete 06/29/2019  Allergen Reaction Noted  . Aspirin Other (See Comments) 01/29/2011  . Celecoxib Other (See Comments) 11/28/2011  . Ibuprofen Other (See Comments) 01/29/2011  . Lyrica [pregabalin] Swelling 01/29/2011  . Nsaids  08/22/2011  . Vicodin [hydrocodone-acetaminophen]  10/16/2015    Family History  Problem Relation Age of Onset  . Prostate cancer Father   . Heart disease Father   . Diabetes Mother   . Heart disease Mother   . Colon cancer Maternal Uncle        dx in his 56's  . Pancreatic cancer Paternal Uncle   . Prostate cancer Paternal Uncle   . Multiple sclerosis Daughter   . Prostate cancer Paternal Uncle   . Esophageal cancer Neg Hx   . Rectal cancer Neg Hx   . Stomach cancer Neg Hx     Social History   Socioeconomic History  . Marital status: Married    Spouse name: Water quality scientist  . Number of children: 3  . Years of education: 12th  . Highest education level: Not on file  Occupational History  . Occupation: unemployed    Fish farm manager: OTHER     Employer: RETIRED  Social Needs  . Financial resource strain: Not on file  . Food insecurity    Worry: Not on file    Inability: Not on file  . Transportation needs    Medical: Not on file    Non-medical: Not on file  Tobacco Use  . Smoking status: Current Every Day Smoker    Packs/day: 0.50    Years: 40.00    Pack years: 20.00  Types: Cigarettes  . Smokeless tobacco: Never Used  Substance and Sexual Activity  . Alcohol use: Yes    Alcohol/week: 0.0 standard drinks    Comment: Occasional  . Drug use: Yes    Types: Marijuana    Comment: for nausea  . Sexual activity: Not on file  Lifestyle  . Physical activity    Days per week: Not on file    Minutes per session: Not on file  . Stress: Not on file  Relationships  . Social Herbalist on phone: Not on file    Gets together: Not on file    Attends religious service: Not on file    Active member of club or organization: Not on file    Attends meetings of clubs or organizations: Not on file    Relationship status: Not on file  . Intimate partner violence    Fear of current or ex partner: Not on file    Emotionally abused: Not on file    Physically abused: Not on file    Forced sexual activity: Not on file  Other Topics Concern  . Not on file  Social History Narrative   Patient lives at home with his family.   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    Caffeine use: 1-2 cups daily    Review of Systems:    Constitutional: No weight loss, fever or chills Cardiovascular: No chest pain  Respiratory: No SOB  Gastrointestinal: See HPI and otherwise negative   Physical Exam:  Vital signs: BP 122/82 (BP Location: Left Arm, Patient Position: Sitting)   Pulse 86   Temp 98.2 F (36.8 C)   Ht 6\' 4"  (1.93 m)   Wt 242 lb (109.8 kg)   SpO2 98%   BMI 29.46 kg/m   Constitutional:   Pleasant AA male appears to be in NAD, Well developed, Well nourished, alert and cooperative  Respiratory: Respirations even and unlabored. Lungs clear to auscultation bilaterally.   No wheezes, crackles, or rhonchi.  Cardiovascular: Normal S1, S2. No MRG. Regular rate and rhythm. No peripheral edema, cyanosis or pallor.  Gastrointestinal:  Soft, nondistended, marked LLQ ttp to deep palpation. No rebound or guarding. Normal bowel sounds. No appreciable masses or hepatomegaly. Skin: sub q nodule over area of epigastrum which is tender to touch, no erythema or discharge Psychiatric: Demonstrates good judgement and reason without abnormal affect or behaviors.  No recent labs or imaging.  Assessment: 1.  Left lower quadrant abdominal pain: Off-and-on about every month for the patient, apparently hospitalized with treatment for diverticulitis in the past, recent colonoscopy with no signs of diverticula that I can see with pictures provided or with notes, but we will treat him empirically for this to see if it helps; alternative is IBS 2.  Tender subcu nodules: Over area of epigastrium, apparently been there for some time but patient has shooting pain with palpation of these nodules, will refer him to dermatology  Plan: 1.  Review of recent colonoscopy does not show evidence of diverticulosis.  It sounds as though the patient was treated for diverticulitis previously with good results.  We will start him on empiric antibiotics. 2.  Prescribed Flagyl 500 mg 3 times daily for 7 days #21 3.  Prescribed Ciprofloxacin 500 mg twice daily x7 days #14 4.  Recommend the patient remain on a low fiber/ low residue diet during time of treatment and as long as he continues with left lower  quadrant pain.  Provided him with a handout. 5.  Continue dicyclomine as needed 6.  Referred patient to a dermatologist for tender subcu nodules on exam today 6.  Patient to follow in clinic with [place] as needed in the future.  If he has another episode of this extreme pain would recommend he call our clinic.  At that time I  recommend a CT abdomen pelvis for further eval and documentation of possible diverticulitis.  Ellouise Newer, PA-C [facility] 08/03/2019, 9:14 AM  Cc: Elwyn Reach, MD

## 2019-08-04 ENCOUNTER — Other Ambulatory Visit: Payer: Self-pay | Admitting: Hematology

## 2019-08-04 DIAGNOSIS — I82401 Acute embolism and thrombosis of unspecified deep veins of right lower extremity: Secondary | ICD-10-CM

## 2019-08-04 DIAGNOSIS — Z7901 Long term (current) use of anticoagulants: Secondary | ICD-10-CM

## 2019-09-07 ENCOUNTER — Other Ambulatory Visit: Payer: Self-pay | Admitting: Hematology

## 2019-09-07 DIAGNOSIS — Z7901 Long term (current) use of anticoagulants: Secondary | ICD-10-CM

## 2019-09-07 DIAGNOSIS — I82401 Acute embolism and thrombosis of unspecified deep veins of right lower extremity: Secondary | ICD-10-CM

## 2019-09-14 ENCOUNTER — Other Ambulatory Visit: Payer: Self-pay | Admitting: Hematology

## 2019-09-14 DIAGNOSIS — Z7901 Long term (current) use of anticoagulants: Secondary | ICD-10-CM

## 2019-09-14 DIAGNOSIS — I82401 Acute embolism and thrombosis of unspecified deep veins of right lower extremity: Secondary | ICD-10-CM

## 2019-09-14 NOTE — Telephone Encounter (Signed)
Patient to followup with PCP for continued refills of lifetime Xarelto.

## 2019-09-23 ENCOUNTER — Telehealth: Payer: Self-pay | Admitting: Physician Assistant

## 2019-09-23 NOTE — Telephone Encounter (Signed)
Please see note below from pt, it looks like referral was placed at the time of office visit.

## 2019-09-23 NOTE — Telephone Encounter (Signed)
Referred patient again dermatology to [facility]. Appointment for Monday 09/27/19 @ 2 pm with Dr. Pearline Cables. Patient and patient's wife informed of appointment time and date.

## 2019-09-24 ENCOUNTER — Encounter: Payer: Self-pay | Admitting: Hematology

## 2019-10-13 ENCOUNTER — Other Ambulatory Visit: Payer: Self-pay | Admitting: Hematology

## 2019-10-13 DIAGNOSIS — I82401 Acute embolism and thrombosis of unspecified deep veins of right lower extremity: Secondary | ICD-10-CM

## 2019-10-13 DIAGNOSIS — Z7901 Long term (current) use of anticoagulants: Secondary | ICD-10-CM

## 2019-10-19 ENCOUNTER — Telehealth: Payer: Self-pay | Admitting: *Deleted

## 2019-10-19 ENCOUNTER — Other Ambulatory Visit: Payer: Self-pay | Admitting: Hematology

## 2019-10-19 DIAGNOSIS — I82401 Acute embolism and thrombosis of unspecified deep veins of right lower extremity: Secondary | ICD-10-CM

## 2019-10-19 DIAGNOSIS — Z7901 Long term (current) use of anticoagulants: Secondary | ICD-10-CM

## 2019-10-19 MED ORDER — RIVAROXABAN 20 MG PO TABS: 20.0000 mg | ORAL_TABLET | Freq: Every day | ORAL | 0 refills | Status: DC

## 2019-10-19 NOTE — Telephone Encounter (Signed)
Refill requested for Xarelto. Per DR. Irene Limbo can refill one month only. Patient needs to f/u with PCP for future refills.  Contacted patient - no answer - LVM with this information

## 2019-12-01 ENCOUNTER — Telehealth: Payer: Self-pay | Admitting: Physician Assistant

## 2019-12-01 NOTE — Telephone Encounter (Signed)
Spoke to patient to see if he had received his lab sheet and had his labs done. He said he had received the lab sheet and was palnning to have them doen this Friday. He is to call after he has them done.

## 2019-12-03 ENCOUNTER — Other Ambulatory Visit: Payer: Self-pay | Admitting: Physician Assistant

## 2019-12-06 LAB — ALDOLASE: Aldolase: 9.2 U/L — ABNORMAL HIGH (ref <=8.1)

## 2019-12-06 LAB — SEDIMENTATION RATE: Sed Rate: 2 mm/h (ref 0–20)

## 2019-12-06 LAB — CK: Total CK: 601 U/L — ABNORMAL HIGH (ref 44–196)

## 2019-12-10 ENCOUNTER — Encounter (HOSPITAL_COMMUNITY): Payer: Self-pay

## 2019-12-10 ENCOUNTER — Emergency Department (HOSPITAL_COMMUNITY)
Admission: EM | Admit: 2019-12-10 | Discharge: 2019-12-10 | Disposition: A | Discharge status: Home / Self Care | Payer: Medicare Other | Attending: Emergency Medicine | Admitting: Emergency Medicine

## 2019-12-10 ENCOUNTER — Emergency Department (HOSPITAL_COMMUNITY): Payer: Medicare Other

## 2019-12-10 ENCOUNTER — Other Ambulatory Visit: Payer: Self-pay

## 2019-12-10 DIAGNOSIS — Y9389 Activity, other specified: Secondary | ICD-10-CM | POA: Diagnosis not present

## 2019-12-10 DIAGNOSIS — M25512 Pain in left shoulder: Secondary | ICD-10-CM | POA: Diagnosis not present

## 2019-12-10 DIAGNOSIS — F17210 Nicotine dependence, cigarettes, uncomplicated: Secondary | ICD-10-CM | POA: Diagnosis not present

## 2019-12-10 DIAGNOSIS — Y999 Unspecified external cause status: Secondary | ICD-10-CM | POA: Diagnosis not present

## 2019-12-10 DIAGNOSIS — R42 Dizziness and giddiness: Secondary | ICD-10-CM | POA: Diagnosis not present

## 2019-12-10 DIAGNOSIS — Z86711 Personal history of pulmonary embolism: Secondary | ICD-10-CM | POA: Diagnosis not present

## 2019-12-10 DIAGNOSIS — Y92410 Unspecified street and highway as the place of occurrence of the external cause: Secondary | ICD-10-CM | POA: Insufficient documentation

## 2019-12-10 DIAGNOSIS — Z7901 Long term (current) use of anticoagulants: Secondary | ICD-10-CM | POA: Diagnosis not present

## 2019-12-10 DIAGNOSIS — M542 Cervicalgia: Secondary | ICD-10-CM | POA: Insufficient documentation

## 2019-12-10 DIAGNOSIS — M545 Low back pain: Secondary | ICD-10-CM | POA: Insufficient documentation

## 2019-12-10 DIAGNOSIS — I720 Aneurysm of carotid artery: Secondary | ICD-10-CM | POA: Insufficient documentation

## 2019-12-10 DIAGNOSIS — R519 Headache, unspecified: Secondary | ICD-10-CM | POA: Diagnosis present

## 2019-12-10 DIAGNOSIS — I671 Cerebral aneurysm, nonruptured: Secondary | ICD-10-CM

## 2019-12-10 LAB — CBC WITH DIFFERENTIAL/PLATELET
Abs Immature Granulocytes: 0.01 10*3/uL (ref 0.00–0.07)
Basophils Absolute: 0 10*3/uL (ref 0.0–0.1)
Basophils Relative: 1 %
Eosinophils Absolute: 0.1 10*3/uL (ref 0.0–0.5)
Eosinophils Relative: 3 %
HCT: 40.4 % (ref 39.0–52.0)
Hemoglobin: 12.8 g/dL — ABNORMAL LOW (ref 13.0–17.0)
Immature Granulocytes: 0 %
Lymphocytes Relative: 32 %
Lymphs Abs: 1.6 10*3/uL (ref 0.7–4.0)
MCH: 23.5 pg — ABNORMAL LOW (ref 26.0–34.0)
MCHC: 31.7 g/dL (ref 30.0–36.0)
MCV: 74.3 fL — ABNORMAL LOW (ref 80.0–100.0)
Monocytes Absolute: 0.5 10*3/uL (ref 0.1–1.0)
Monocytes Relative: 10 %
Neutro Abs: 2.6 10*3/uL (ref 1.7–7.7)
Neutrophils Relative %: 54 %
Platelets: 304 10*3/uL (ref 150–400)
RBC: 5.44 MIL/uL (ref 4.22–5.81)
RDW: 15.5 % (ref 11.5–15.5)
WBC: 4.8 10*3/uL (ref 4.0–10.5)
nRBC: 0 % (ref 0.0–0.2)

## 2019-12-10 LAB — BASIC METABOLIC PANEL
Anion gap: 11 (ref 5–15)
BUN: 12 mg/dL (ref 8–23)
CO2: 30 mmol/L (ref 22–32)
Calcium: 9.3 mg/dL (ref 8.9–10.3)
Chloride: 102 mmol/L (ref 98–111)
Creatinine, Ser: 1.2 mg/dL (ref 0.61–1.24)
GFR calc Af Amer: >60 mL/min (ref >60)
GFR calc non Af Amer: >60 mL/min (ref >60)
Glucose, Bld: 95 mg/dL (ref 70–99)
Potassium: 3.7 mmol/L (ref 3.5–5.1)
Sodium: 143 mmol/L (ref 135–145)

## 2019-12-10 MED ORDER — SODIUM CHLORIDE 0.9 % IV BOLUS
1000.0000 mL | Freq: Once | INTRAVENOUS | Status: AC
  Administered 2019-12-10: 1000 mL via INTRAVENOUS

## 2019-12-10 MED ORDER — IOHEXOL 350 MG/ML SOLN
100.0000 mL | Freq: Once | INTRAVENOUS | Status: AC | PRN
  Administered 2019-12-10: 14:00:00 100 mL via INTRAVENOUS

## 2019-12-10 MED ORDER — SODIUM CHLORIDE (PF) 0.9 % IJ SOLN
INTRAMUSCULAR | Status: AC
  Filled 2019-12-10: qty 50

## 2019-12-10 MED ORDER — FENTANYL CITRATE (PF) 100 MCG/2ML IJ SOLN
100.0000 ug | Freq: Once | INTRAMUSCULAR | Status: AC
  Administered 2019-12-10: 100 ug via INTRAVENOUS
  Filled 2019-12-10: qty 2

## 2019-12-10 MED ORDER — MECLIZINE HCL 25 MG PO TABS
25.0000 mg | ORAL_TABLET | Freq: Once | ORAL | Status: AC
  Administered 2019-12-10: 25 mg via ORAL
  Filled 2019-12-10 (×2): qty 1
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
## 2019-12-10 NOTE — Discharge Instructions (Signed)
Your CT scans showed 3 small aneurysms in your brain.  These do not appear to be ruptured or causing any acute symptoms.  However you need to have these followed up by neurosurgeon specialist and have been referred to one.   If you develop continued, recurrent, or worsening headache, fever, neck stiffness, vomiting, blurry or double vision, weakness or numbness in your arms or legs, trouble speaking, or any other new/concerning symptoms then return to the ER for evaluation.

## 2019-12-10 NOTE — ED Notes (Signed)
Pt ambulatory to RR w/out assistance. 
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
## 2019-12-10 NOTE — ED Notes (Signed)
Patient to xray.

## 2019-12-21 ENCOUNTER — Telehealth: Payer: Self-pay | Admitting: Physician Assistant
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
## 2019-12-22 ENCOUNTER — Emergency Department (HOSPITAL_COMMUNITY)
Admission: EM | Admit: 2019-12-22 | Discharge: 2019-12-22 | Disposition: A | Discharge status: Home / Self Care | Payer: Medicare Other | Attending: Emergency Medicine | Admitting: Emergency Medicine

## 2019-12-22 ENCOUNTER — Emergency Department (HOSPITAL_COMMUNITY): Payer: Medicare Other

## 2019-12-22 ENCOUNTER — Encounter (HOSPITAL_COMMUNITY): Payer: Self-pay | Admitting: Emergency Medicine

## 2019-12-22 ENCOUNTER — Other Ambulatory Visit: Payer: Self-pay

## 2019-12-22 DIAGNOSIS — Z79899 Other long term (current) drug therapy: Secondary | ICD-10-CM | POA: Insufficient documentation

## 2019-12-22 DIAGNOSIS — F17210 Nicotine dependence, cigarettes, uncomplicated: Secondary | ICD-10-CM | POA: Diagnosis not present

## 2019-12-22 DIAGNOSIS — F1210 Cannabis abuse, uncomplicated: Secondary | ICD-10-CM | POA: Diagnosis not present

## 2019-12-22 DIAGNOSIS — Z7901 Long term (current) use of anticoagulants: Secondary | ICD-10-CM | POA: Insufficient documentation

## 2019-12-22 DIAGNOSIS — M954 Acquired deformity of chest and rib: Secondary | ICD-10-CM | POA: Diagnosis present

## 2019-12-22 DIAGNOSIS — I10 Essential (primary) hypertension: Secondary | ICD-10-CM | POA: Diagnosis not present

## 2019-12-22 DIAGNOSIS — Z86718 Personal history of other venous thrombosis and embolism: Secondary | ICD-10-CM | POA: Diagnosis not present

## 2019-12-22 MED ORDER — OXYCODONE-ACETAMINOPHEN 5-325 MG PO TABS
2.0000 | ORAL_TABLET | Freq: Once | ORAL | Status: AC
  Administered 2019-12-22: 2 via ORAL
  Filled 2019-12-22: qty 2

## 2019-12-22 MED ORDER — OXYCODONE-ACETAMINOPHEN 5-325 MG PO TABS: 1.0000 | ORAL_TABLET | Freq: Once | ORAL | Status: DC
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
## 2019-12-22 NOTE — ED Notes (Signed)
Called for V/S recheck x1 No answer 

## 2019-12-22 NOTE — Discharge Instructions (Addendum)
Please follow-up with your primary care provider, tomorrow, as scheduled.  You will need to have your pain medications refilled or be referred to outpatient pain management.  Please drink plenty fluids given your recent laboratory value that suggested muscle breakdown.  You need to continue following up with your outpatient specialty clinics, as needed.  Please return to the ED or seek immediate medical attention for any new or worsening symptoms.
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
## 2020-01-06 ENCOUNTER — Ambulatory Visit: Payer: Medicare Other | Admitting: Physician Assistant

## 2020-01-11 ENCOUNTER — Other Ambulatory Visit: Payer: Self-pay | Admitting: Neurosurgery

## 2020-01-11 DIAGNOSIS — I671 Cerebral aneurysm, nonruptured: Secondary | ICD-10-CM

## 2020-01-14 ENCOUNTER — Other Ambulatory Visit (HOSPITAL_COMMUNITY)
Admission: RE | Admit: 2020-01-14 | Discharge: 2020-01-14 | Disposition: A | Discharge status: Home / Self Care | Payer: Medicare Other | Source: Ambulatory Visit | Attending: Neurosurgery | Admitting: Neurosurgery

## 2020-01-14 DIAGNOSIS — Z20822 Contact with and (suspected) exposure to covid-19: Secondary | ICD-10-CM | POA: Insufficient documentation

## 2020-01-14 DIAGNOSIS — Z01812 Encounter for preprocedural laboratory examination: Secondary | ICD-10-CM | POA: Diagnosis present

## 2020-01-14 LAB — SARS CORONAVIRUS 2 (TAT 6-24 HRS): SARS Coronavirus 2: NEGATIVE
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
## 2020-01-18 ENCOUNTER — Ambulatory Visit (HOSPITAL_COMMUNITY)
Admission: RE | Admit: 2020-01-18 | Discharge: 2020-01-18 | Disposition: A | Discharge status: Home / Self Care | Payer: Medicare Other | Source: Ambulatory Visit | Attending: Neurosurgery | Admitting: Neurosurgery

## 2020-01-18 ENCOUNTER — Other Ambulatory Visit: Payer: Self-pay

## 2020-01-18 ENCOUNTER — Other Ambulatory Visit: Payer: Self-pay | Admitting: Neurosurgery

## 2020-01-18 DIAGNOSIS — I671 Cerebral aneurysm, nonruptured: Secondary | ICD-10-CM

## 2020-01-18 DIAGNOSIS — M1711 Unilateral primary osteoarthritis, right knee: Secondary | ICD-10-CM | POA: Diagnosis not present

## 2020-01-18 DIAGNOSIS — Z86718 Personal history of other venous thrombosis and embolism: Secondary | ICD-10-CM | POA: Diagnosis not present

## 2020-01-18 DIAGNOSIS — F329 Major depressive disorder, single episode, unspecified: Secondary | ICD-10-CM | POA: Diagnosis not present

## 2020-01-18 DIAGNOSIS — M109 Gout, unspecified: Secondary | ICD-10-CM | POA: Diagnosis not present

## 2020-01-18 DIAGNOSIS — I10 Essential (primary) hypertension: Secondary | ICD-10-CM | POA: Diagnosis not present

## 2020-01-18 DIAGNOSIS — G629 Polyneuropathy, unspecified: Secondary | ICD-10-CM | POA: Diagnosis not present

## 2020-01-18 DIAGNOSIS — Z86711 Personal history of pulmonary embolism: Secondary | ICD-10-CM | POA: Diagnosis not present

## 2020-01-18 DIAGNOSIS — Z886 Allergy status to analgesic agent status: Secondary | ICD-10-CM | POA: Insufficient documentation

## 2020-01-18 DIAGNOSIS — F17210 Nicotine dependence, cigarettes, uncomplicated: Secondary | ICD-10-CM | POA: Insufficient documentation

## 2020-01-18 DIAGNOSIS — Z7901 Long term (current) use of anticoagulants: Secondary | ICD-10-CM | POA: Diagnosis not present

## 2020-01-18 DIAGNOSIS — Z79899 Other long term (current) drug therapy: Secondary | ICD-10-CM | POA: Insufficient documentation

## 2020-01-18 DIAGNOSIS — F419 Anxiety disorder, unspecified: Secondary | ICD-10-CM | POA: Diagnosis not present

## 2020-01-18 HISTORY — PX: IR ANGIO INTRA EXTRACRAN SEL INTERNAL CAROTID BILAT MOD SED: IMG5363

## 2020-01-18 HISTORY — PX: IR ANGIO VERTEBRAL SEL VERTEBRAL UNI L MOD SED: IMG5367

## 2020-01-18 HISTORY — PX: IR US GUIDE VASC ACCESS RIGHT: IMG2390

## 2020-01-18 HISTORY — PX: IR ANGIO VERTEBRAL SEL VERTEBRAL BILAT MOD SED: IMG5369

## 2020-01-18 LAB — BASIC METABOLIC PANEL
Anion gap: 11 (ref 5–15)
BUN: 12 mg/dL (ref 8–23)
CO2: 26 mmol/L (ref 22–32)
Calcium: 9.5 mg/dL (ref 8.9–10.3)
Chloride: 101 mmol/L (ref 98–111)
Creatinine, Ser: 0.86 mg/dL (ref 0.61–1.24)
GFR calc Af Amer: >60 mL/min (ref >60)
GFR calc non Af Amer: >60 mL/min (ref >60)
Glucose, Bld: 105 mg/dL — ABNORMAL HIGH (ref 70–99)
Potassium: 3 mmol/L — ABNORMAL LOW (ref 3.5–5.1)
Sodium: 138 mmol/L (ref 135–145)

## 2020-01-18 LAB — CBC WITH DIFFERENTIAL/PLATELET
Abs Immature Granulocytes: 0 10*3/uL (ref 0.00–0.07)
Basophils Absolute: 0 10*3/uL (ref 0.0–0.1)
Basophils Relative: 1 %
Eosinophils Absolute: 0 10*3/uL (ref 0.0–0.5)
Eosinophils Relative: 1 %
HCT: 43 % (ref 39.0–52.0)
Hemoglobin: 14 g/dL (ref 13.0–17.0)
Immature Granulocytes: 0 %
Lymphocytes Relative: 31 %
Lymphs Abs: 1.2 10*3/uL (ref 0.7–4.0)
MCH: 23 pg — ABNORMAL LOW (ref 26.0–34.0)
MCHC: 32.6 g/dL (ref 30.0–36.0)
MCV: 70.7 fL — ABNORMAL LOW (ref 80.0–100.0)
Monocytes Absolute: 0.5 10*3/uL (ref 0.1–1.0)
Monocytes Relative: 11 %
Neutro Abs: 2.3 10*3/uL (ref 1.7–7.7)
Neutrophils Relative %: 56 %
Platelets: 263 10*3/uL (ref 150–400)
RBC: 6.08 MIL/uL — ABNORMAL HIGH (ref 4.22–5.81)
RDW: 15.5 % (ref 11.5–15.5)
WBC: 4 10*3/uL (ref 4.0–10.5)
nRBC: 0 % (ref 0.0–0.2)

## 2020-01-18 LAB — URINALYSIS, ROUTINE W REFLEX MICROSCOPIC
Bilirubin Urine: NEGATIVE
Glucose, UA: NEGATIVE mg/dL
Hgb urine dipstick: NEGATIVE
Ketones, ur: 5 mg/dL — AB
Leukocytes,Ua: NEGATIVE
Nitrite: NEGATIVE
Protein, ur: NEGATIVE mg/dL
Specific Gravity, Urine: 1.023 (ref 1.005–1.030)
pH: 5 (ref 5.0–8.0)

## 2020-01-18 LAB — PROTIME-INR
INR: 1.1 (ref 0.8–1.2)
Prothrombin Time: 14.1 seconds (ref 11.4–15.2)

## 2020-01-18 LAB — APTT: aPTT: 34 seconds (ref 24–36)

## 2020-01-18 MED ORDER — LABETALOL HCL 5 MG/ML IV SOLN
INTRAVENOUS | Status: AC | PRN
  Administered 2020-01-18: 10 mg via INTRAVENOUS

## 2020-01-18 MED ORDER — FENTANYL CITRATE (PF) 100 MCG/2ML IJ SOLN
INTRAMUSCULAR | Status: AC | PRN
  Administered 2020-01-18: 25 ug via INTRAVENOUS

## 2020-01-18 MED ORDER — IOHEXOL 300 MG/ML  SOLN
150.0000 mL | Freq: Once | INTRAMUSCULAR | Status: AC | PRN
  Administered 2020-01-18: 65 mL via INTRA_ARTERIAL

## 2020-01-18 MED ORDER — FENTANYL CITRATE (PF) 100 MCG/2ML IJ SOLN
INTRAMUSCULAR | Status: AC
  Filled 2020-01-18: qty 2

## 2020-01-18 MED ORDER — SODIUM CHLORIDE 0.9 % IV SOLN: INTRAVENOUS | Status: DC

## 2020-01-18 MED ORDER — CHLORHEXIDINE GLUCONATE CLOTH 2 % EX PADS: 6.0000 | MEDICATED_PAD | Freq: Once | CUTANEOUS | Status: DC

## 2020-01-18 MED ORDER — LABETALOL HCL 5 MG/ML IV SOLN
INTRAVENOUS | Status: AC
  Filled 2020-01-18: qty 4

## 2020-01-18 MED ORDER — CEFAZOLIN SODIUM-DEXTROSE 2-4 GM/100ML-% IV SOLN: 2.0000 g | INTRAVENOUS | Status: DC

## 2020-01-18 MED ORDER — LIDOCAINE HCL (PF) 1 % IJ SOLN
INTRAMUSCULAR | Status: AC | PRN
  Administered 2020-01-18: 10 mL

## 2020-01-18 MED ORDER — NITROGLYCERIN 1 MG/10 ML FOR IR/CATH LAB
INTRA_ARTERIAL | Status: AC
  Filled 2020-01-18: qty 10

## 2020-01-18 MED ORDER — LIDOCAINE HCL 1 % IJ SOLN
INTRAMUSCULAR | Status: AC
  Filled 2020-01-18: qty 20

## 2020-01-18 MED ORDER — MIDAZOLAM HCL 2 MG/2ML IJ SOLN
INTRAMUSCULAR | Status: AC
  Filled 2020-01-18: qty 2

## 2020-01-18 MED ORDER — VERAPAMIL HCL 2.5 MG/ML IV SOLN
INTRAVENOUS | Status: AC
  Filled 2020-01-18: qty 2

## 2020-01-18 MED ORDER — HEPARIN SODIUM (PORCINE) 1000 UNIT/ML IJ SOLN
INTRAMUSCULAR | Status: AC
  Filled 2020-01-18: qty 1

## 2020-01-18 MED ORDER — MIDAZOLAM HCL 2 MG/2ML IJ SOLN
INTRAMUSCULAR | Status: AC | PRN
  Administered 2020-01-18: 1 mg via INTRAVENOUS

## 2020-01-18 MED ORDER — HEPARIN SODIUM (PORCINE) 1000 UNIT/ML IJ SOLN
INTRAMUSCULAR | Status: AC | PRN
  Administered 2020-01-18: 2000 [IU] via INTRAVENOUS

## 2020-01-18 NOTE — Progress Notes (Signed)
  NEUROSURGERY BRIEF OPERATIVE  NOTE   PREOP DX: Cerebral Aneurysms  POSTOP DX: Same  PROCEDURE: Diagnostic cerebral angiogram  SURGEON: Dr. Consuella Lose, MD  ANESTHESIA: IV Sedation with Local  EBL: Minimal  SPECIMENS: None  COMPLICATIONS: None  CONDITION: Stable to recovery  FINDINGS (Full report in [facility]): 1. No aneurysms, AVM, or fistulas seen. 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

## 2020-01-18 NOTE — Discharge Instructions (Addendum)
MAY RESUME XARELTO TOMORROW  Cerebral Angiogram, Care After This sheet gives you information about how to care for yourself after your procedure. Your health care provider may also give you more specific instructions. If you have problems or questions, contact your health care provider. What can I expect after the procedure? After the procedure, it is common to have:  Bruising and tenderness at the catheter insertion site.  A mild headache. Follow these instructions at home: Insertion site care  Follow instructions from your health care provider about how to take care of the insertion site. Make sure you: ? Wash your hands with soap and water before and after you change your bandage (dressing). If soap and water are not available, use hand sanitizer. ? Change your dressing as told by your health care provider.  Do not take baths, swim, or use a hot tub until your health care provider approves. You may shower 24-48 hours after the procedure, or as told by your health care provider.  To clean your insertion site: ? Gently wash the site with plain soap and water. ? Pat the area dry with a clean towel. ? Do not rub the site. This may cause bleeding.  Do not apply powder or lotion to the site. Keep the site clean and dry. Infection signs Check your incision area every day for signs of infection. Check for:  Redness, swelling, or pain.  Fluid or blood.  Warmth.  Pus or a bad smell.  Activity  Do not drive for 24 hours if you were given a sedative during your procedure.  Rest as told by your health care provider.  Do not lift anything that is heavier than 10 lb (4.5 kg), or the limit that you are told, until your health care provider says that it is safe.  Return to your normal activities as told by your health care provider, usually in about a week. Ask your health care provider what activities are safe for you. General instructions   If your insertion site starts to bleed,  lie flat and put pressure on the site. If the bleeding does not stop, get help right away. This is a medical emergency.  Do not use any products that contain nicotine or tobacco, such as cigarettes, e-cigarettes, and chewing tobacco. If you need help quitting, ask your health care provider.  Take over-the-counter and prescription medicines only as told by your health care provider.  Drink enough fluid to keep your urine pale yellow. This helps flush the contrast dye from your body.  Keep all follow-up visits as directed by your health care provider. This is important. Contact a health care provider if:  You have a fever or chills.  You have redness, swelling, or pain around your insertion site.  You have fluid or blood coming from your insertion site.  The insertion site feels warm to the touch.  You have pus or a bad smell coming from your insertion site.  You notice blood collecting in the tissue around the insertion site (hematoma). The hematoma may be painful to the touch. Get help right away if:  You have chest pain or trouble breathing.  You have severe pain or swelling at the insertion site.  The insertion area bleeds, and bleeding continues after 30 minutes of holding steady pressure on the site.  The arm or leg where the catheter was inserted is pale, cold, numb, tingling, or weak.  You have a rash.  You have any symptoms of a stroke. "  BE FAST" is an easy way to remember the main warning signs of a stroke: ? B - Balance. Signs are dizziness, sudden trouble walking, or loss of balance. ? E - Eyes. Signs are trouble seeing or a sudden change in vision. ? F - Face. Signs are sudden weakness or numbness of the face, or the face or eyelid drooping on one side. ? A - Arms. Signs are weakness or numbness in an arm. This happens suddenly and usually on one side of the body. ? S - Speech. Signs are sudden trouble speaking, slurred speech, or trouble understanding what people  say. ? T - Time. Time to call emergency services. Write down what time symptoms started.  You have other signs of a stroke, such as: ? A sudden, severe headache with no known cause. ? Nausea or vomiting. ? Seizure. These symptoms may represent a serious problem that is an emergency. Do not wait to see if the symptoms will go away. Get medical help right away. Call your local emergency services (911 in the [place] Do not drive yourself to the hospital. Summary  Bruising and tenderness at the insertion site are common.  Follow your health care provider's instructions about caring for your insertion site. Change dressing and clean the area as instructed.  If your insertion site bleeds, apply direct pressure until bleeding stops.  Return to your normal activities as told by your health care provider. Ask what activities are safe.  Rest and drink plenty of fluids. This information is not intended to replace advice given to you by your health care provider. Make sure you discuss any questions you have with your health care provider. Document Revised: 03/16/2019 Document Reviewed: 03/16/2019 Elsevier Patient Education  [place]. Moderate Conscious Sedation, Adult Sedation is the use of medicines to promote relaxation and relieve discomfort and anxiety. Moderate conscious sedation is a type of sedation. Under moderate conscious sedation, you are less alert than normal, but you are still able to respond to instructions, touch, or both. Moderate conscious sedation is used during short medical and dental procedures. It is milder than deep sedation, which is a type of sedation under which you cannot be easily woken up. It is also milder than general anesthesia, which is the use of medicines to make you unconscious. Moderate conscious sedation allows you to return to your regular activities sooner. Tell a health care provider about:  Any allergies you have.  All medicines you are taking,  including vitamins, herbs, eye drops, creams, and over-the-counter medicines.  Use of steroids (by mouth or creams).  Any problems you or family members have had with sedatives and anesthetic medicines.  Any blood disorders you have.  Any surgeries you have had.  Any medical conditions you have, such as sleep apnea.  Whether you are pregnant or may be pregnant.  Any use of cigarettes, alcohol, marijuana, or street drugs. What are the risks? Generally, this is a safe procedure. However, problems may occur, including:  Getting too much medicine (oversedation).  Nausea.  Allergic reaction to medicines.  Trouble breathing. If this happens, a breathing tube may be used to help with breathing. It will be removed when you are awake and breathing on your own.  Heart trouble.  Lung trouble. What happens before the procedure? Staying hydrated Follow instructions from your health care provider about hydration, which may include:  Up to 2 hours before the procedure - you may continue to drink clear liquids, such as water, clear  fruit juice, black coffee, and plain tea. Eating and drinking restrictions Follow instructions from your health care provider about eating and drinking, which may include:  8 hours before the procedure - stop eating heavy meals or foods such as meat, fried foods, or fatty foods.  6 hours before the procedure - stop eating light meals or foods, such as toast or cereal.  6 hours before the procedure - stop drinking milk or drinks that contain milk.  2 hours before the procedure - stop drinking clear liquids. Medicine Ask your health care provider about:  Changing or stopping your regular medicines. This is especially important if you are taking diabetes medicines or blood thinners.  Taking medicines such as aspirin and ibuprofen. These medicines can thin your blood. Do not take these medicines before your procedure if your health care provider instructs you  not to.  Tests and exams  You will have a physical exam.  You may have blood tests done to show: ? How well your kidneys and liver are working. ? How well your blood can clot. General instructions  Plan to have someone take you home from the hospital or clinic.  If you will be going home right after the procedure, plan to have someone with you for 24 hours. What happens during the procedure?  An IV tube will be inserted into one of your veins.  Medicine to help you relax (sedative) will be given through the IV tube.  The medical or dental procedure will be performed. What happens after the procedure?  Your blood pressure, heart rate, breathing rate, and blood oxygen level will be monitored often until the medicines you were given have worn off.  Do not drive for 24 hours. This information is not intended to replace advice given to you by your health care provider. Make sure you discuss any questions you have with your health care provider. Document Revised: 08/08/2017 Document Reviewed: 12/16/2015 Elsevier Patient Education  2020 [employer].

## 2020-01-18 NOTE — Progress Notes (Signed)
Per Dr Kathyrn Sheriff client may resume xarelto tomorrow

## 2020-01-19 ENCOUNTER — Other Ambulatory Visit: Payer: Self-pay | Admitting: Neurosurgery

## 2020-01-19 ENCOUNTER — Encounter (HOSPITAL_COMMUNITY): Payer: Self-pay

## 2020-01-19 DIAGNOSIS — I671 Cerebral aneurysm, nonruptured: Secondary | ICD-10-CM

## 2020-02-15 ENCOUNTER — Telehealth: Payer: Self-pay | Admitting: Physician Assistant

## 2020-02-15 NOTE — Telephone Encounter (Signed)
See patient note

## 2020-02-15 NOTE — Telephone Encounter (Signed)
Patient's wife, Corleone Biegler, would like to leave a message for [facility] ([place]). Tomie China states that she left a message for Dr. Consuella Lose, the neurologist at [place] had been trying to get in touch with him. The phone number to the place is [phone]. This is concerning the knots on patient's body. Tomie China states that [employer] had requested this information.

## 2020-02-16 ENCOUNTER — Telehealth: Payer: Self-pay | Admitting: *Deleted

## 2020-02-16 NOTE — Telephone Encounter (Signed)
[name] [name] with Dr. Cleotilde Neer office called on behalf of Dr. Kathyrn Sheriff to ask what the specifics or concerns are about this patient, what is the reason for call? When returning call to [phone], Gloris Manchester states to have person answering phone to "come and get her."

## 2020-02-17 NOTE — Telephone Encounter (Signed)
Spoke to [place] at Dr. Cleotilde Neer office. 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 He may need muscle biopsy to confirm diagnosis. Wanting to be sure they are comfortable working with this or not. Could also see Rheum but it may take a long time to get visit. She will speak to Dr and let me know. No further action needed at this time.

## 2020-02-23 NOTE — Telephone Encounter (Signed)
Phone call to patient to inform him that [place] spoke with [facility] @ Dr. Cleotilde Neer office and no further treatment is needed at this time.  Voicemail left with this information on patient's voicemail.

## 2020-02-25 ENCOUNTER — Telehealth: Payer: Self-pay | Admitting: Physician Assistant

## 2020-02-25 NOTE — Telephone Encounter (Signed)
error 

## 2020-02-25 NOTE — Telephone Encounter (Signed)
Patient's wife is calling to say that [name] [name] and she have spoken to [employer] primary care doctor, Gala Romney, MD, about seeing a local neurologist.  Dr. Jonelle Sidle told [name] [name] that he wanted records from  His last visit here.  Verbal permission was given to [facility] at [facility] to fax records to Dr. Jonelle Sidle.  Patient's only visit was 11/04/2019.  Patient would like for [name] [name] to confer with Dr. Jonelle Sidle abut finding local neurologist.  (Chart # [id number])

## 2020-03-03 ENCOUNTER — Encounter: Payer: Self-pay | Admitting: Orthopaedic Surgery

## 2020-03-03 ENCOUNTER — Ambulatory Visit: Payer: Self-pay

## 2020-03-03 ENCOUNTER — Ambulatory Visit (INDEPENDENT_AMBULATORY_CARE_PROVIDER_SITE_OTHER): Payer: Medicare Other | Admitting: Orthopaedic Surgery

## 2020-03-03 VITALS — Ht 76.0 in | Wt 242.0 lb

## 2020-03-03 DIAGNOSIS — M25561 Pain in right knee: Secondary | ICD-10-CM | POA: Diagnosis not present

## 2020-03-03 MED ORDER — METHYLPREDNISOLONE ACETATE 40 MG/ML IJ SUSP
40.0000 mg | INTRAMUSCULAR | Status: AC | PRN
  Administered 2020-03-03: 40 mg via INTRA_ARTICULAR

## 2020-03-03 MED ORDER — LIDOCAINE HCL 1 % IJ SOLN
2.0000 mL | INTRAMUSCULAR | Status: AC | PRN
  Administered 2020-03-03: 2 mL

## 2020-03-03 MED ORDER — BUPIVACAINE HCL 0.5 % IJ SOLN
2.0000 mL | INTRAMUSCULAR | Status: AC | PRN
  Administered 2020-03-03: 2 mL via INTRA_ARTICULAR
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
## 2020-03-08 ENCOUNTER — Other Ambulatory Visit: Payer: Self-pay | Admitting: *Deleted

## 2020-03-08 ENCOUNTER — Other Ambulatory Visit: Payer: Self-pay

## 2020-03-08 ENCOUNTER — Inpatient Hospital Stay: Payer: Medicare Other | Attending: Hematology

## 2020-03-08 ENCOUNTER — Inpatient Hospital Stay (HOSPITAL_BASED_OUTPATIENT_CLINIC_OR_DEPARTMENT_OTHER): Payer: Medicare Other | Admitting: Hematology

## 2020-03-08 VITALS — BP 136/101 | HR 73 | Temp 97.7°F | Resp 18 | Ht 76.0 in | Wt 233.8 lb

## 2020-03-08 DIAGNOSIS — I2782 Chronic pulmonary embolism: Secondary | ICD-10-CM

## 2020-03-08 DIAGNOSIS — I10 Essential (primary) hypertension: Secondary | ICD-10-CM | POA: Insufficient documentation

## 2020-03-08 DIAGNOSIS — Z86718 Personal history of other venous thrombosis and embolism: Secondary | ICD-10-CM | POA: Insufficient documentation

## 2020-03-08 DIAGNOSIS — I82503 Chronic embolism and thrombosis of unspecified deep veins of lower extremity, bilateral: Secondary | ICD-10-CM

## 2020-03-08 DIAGNOSIS — Z86711 Personal history of pulmonary embolism: Secondary | ICD-10-CM | POA: Insufficient documentation

## 2020-03-08 DIAGNOSIS — D689 Coagulation defect, unspecified: Secondary | ICD-10-CM

## 2020-03-08 DIAGNOSIS — F17210 Nicotine dependence, cigarettes, uncomplicated: Secondary | ICD-10-CM | POA: Insufficient documentation

## 2020-03-08 LAB — CMP (CANCER CENTER ONLY)
ALT: 32 U/L (ref 0–44)
AST: 15 U/L (ref 15–41)
Albumin: 3.9 g/dL (ref 3.5–5.0)
Alkaline Phosphatase: 60 U/L (ref 38–126)
Anion gap: 10 (ref 5–15)
BUN: 10 mg/dL (ref 8–23)
CO2: 29 mmol/L (ref 22–32)
Calcium: 9.4 mg/dL (ref 8.9–10.3)
Chloride: 102 mmol/L (ref 98–111)
Creatinine: 1.12 mg/dL (ref 0.61–1.24)
GFR, Est AFR Am: >60 mL/min (ref >60)
GFR, Estimated: >60 mL/min (ref >60)
Glucose, Bld: 155 mg/dL — ABNORMAL HIGH (ref 70–99)
Potassium: 3.8 mmol/L (ref 3.5–5.1)
Sodium: 141 mmol/L (ref 135–145)
Total Bilirubin: 0.3 mg/dL (ref 0.3–1.2)
Total Protein: 6.7 g/dL (ref 6.5–8.1)

## 2020-03-08 LAB — CBC WITH DIFFERENTIAL (CANCER CENTER ONLY)
Abs Immature Granulocytes: 0.02 10*3/uL (ref 0.00–0.07)
Basophils Absolute: 0 10*3/uL (ref 0.0–0.1)
Basophils Relative: 1 %
Eosinophils Absolute: 0.1 10*3/uL (ref 0.0–0.5)
Eosinophils Relative: 1 %
HCT: 42 % (ref 39.0–52.0)
Hemoglobin: 13.2 g/dL (ref 13.0–17.0)
Immature Granulocytes: 0 %
Lymphocytes Relative: 36 %
Lymphs Abs: 2 10*3/uL (ref 0.7–4.0)
MCH: 22.7 pg — ABNORMAL LOW (ref 26.0–34.0)
MCHC: 31.4 g/dL (ref 30.0–36.0)
MCV: 72.3 fL — ABNORMAL LOW (ref 80.0–100.0)
Monocytes Absolute: 0.5 10*3/uL (ref 0.1–1.0)
Monocytes Relative: 9 %
Neutro Abs: 2.9 10*3/uL (ref 1.7–7.7)
Neutrophils Relative %: 53 %
Platelet Count: 272 10*3/uL (ref 150–400)
RBC: 5.81 MIL/uL (ref 4.22–5.81)
RDW: 16.3 % — ABNORMAL HIGH (ref 11.5–15.5)
WBC Count: 5.4 10*3/uL (ref 4.0–10.5)
nRBC: 0 % (ref 0.0–0.2)
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
## 2020-03-10 ENCOUNTER — Ambulatory Visit: Payer: Medicare Other | Admitting: Orthopaedic Surgery

## 2020-03-22 NOTE — Telephone Encounter (Signed)
See note

## 2020-03-27 ENCOUNTER — Telehealth: Payer: Self-pay | Admitting: Physician Assistant

## 2020-03-27 NOTE — Telephone Encounter (Signed)
Patient is calling to say that he would like for Arlyss Gandy, [facility] ([facility]) to contact his primary care physician, Gala Romney, MD.  Patient says that he takes pain medication and muscle relaxers due to pain from lumps in his skin.  Patient feels as though Dr Jonelle Sidle is not wanting to help him..  Patient wants JCB to contact Dr, Jonelle Sidle to explain his situation to Dr. Jonelle Sidle that he needs help.  Patient is so very frustrated at this time.   (Chart # [id number])

## 2020-03-29 ENCOUNTER — Telehealth: Payer: Self-pay | Admitting: Physician Assistant

## 2020-03-29 ENCOUNTER — Encounter: Payer: Self-pay | Admitting: Orthopaedic Surgery

## 2020-03-29 ENCOUNTER — Ambulatory Visit (INDEPENDENT_AMBULATORY_CARE_PROVIDER_SITE_OTHER): Payer: Medicare Other | Admitting: Orthopaedic Surgery

## 2020-03-29 ENCOUNTER — Telehealth: Payer: Self-pay

## 2020-03-29 VITALS — Ht 74.0 in | Wt 232.2 lb

## 2020-03-29 DIAGNOSIS — M1712 Unilateral primary osteoarthritis, left knee: Secondary | ICD-10-CM

## 2020-03-29 DIAGNOSIS — M1711 Unilateral primary osteoarthritis, right knee: Secondary | ICD-10-CM

## 2020-03-29 NOTE — Telephone Encounter (Signed)
Please get approval for Bilateral knee Gel injections-Dr. Erlinda Hong pt

## 2020-03-29 NOTE — Telephone Encounter (Signed)
Patient's wife calling to remind JCB to call Dr Jonelle Sidle (primary care) about getting patient a neurologist about lumps under skin.

## 2020-03-29 NOTE — Progress Notes (Signed)
Office Visit Note   Patient: [name] [name]           Date of Birth: 02/28/1951           MRN: [id number] Visit Date: 03/29/2020              Requested by: Elwyn Reach, MD [place],  [place] [id number] PCP: Elwyn Reach, MD   Assessment & Plan: Visit Diagnoses:  1. Primary osteoarthritis of right knee   2. Primary osteoarthritis of left knee     Plan: Impression is bilateral knee osteoarthritis.  After consideration of his options he would like to try Visco injections.  This patient is diagnosed with osteoarthritis of the knee(s).    Radiographs show evidence of joint space narrowing, osteophytes, subchondral sclerosis and/or subchondral cysts.  This patient has knee pain which interferes with functional and activities of daily living.    This patient has experienced inadequate response, adverse effects and/or intolerance with conservative treatments such as acetaminophen, NSAIDS, topical creams, physical therapy or regular exercise, knee bracing and/or weight loss.   This patient has experienced inadequate response or has a contraindication to intra articular steroid injections for at least 3 months.   This patient is not scheduled to have a total knee replacement within 6 months of starting treatment with viscosupplementation.  Follow-Up Instructions: Return if symptoms worsen or fail to improve.   Orders:  No orders of the defined types were placed in this encounter.  No orders of the defined types were placed in this encounter.     Procedures: No procedures performed   Clinical Data: No additional findings.   Subjective: Chief Complaint  Patient presents with  . Left Knee - Pain  . Right Knee - Pain    [name] [name] returns today for bilateral knee pain.  He did not feel significant improvement from the cortisone injections.   Review of Systems   Objective: Vital Signs: Ht 6\' 2"  (1.88 m)   Wt 232 lb 3.2 oz (105.3 kg)   BMI 29.81  kg/m   Physical Exam  Ortho Exam Exams are unchanged. Specialty Comments:  No specialty comments available.  Imaging: No results found.   PMFS History: Patient Active Problem List   Diagnosis Date Noted  . Primary osteoarthritis of left knee 03/29/2020  . Primary osteoarthritis of right knee 07/08/2017  . Complete rotator cuff tear of left shoulder 10/21/2016  . Gout attack 04/02/2016  . Angina pectoris ([place]) 08/01/2014  . Hemorrhage of rectum and anus 03/28/2014  . Neuropathy, peripheral 05/15/2012  . Pulmonary embolism ([place]) 07/11/2011  . DVT (deep venous thrombosis) ([place]) 07/11/2011  . Abdominal pain, left lower quadrant 01/29/2011  . Long term current use of anticoagulant therapy 01/29/2011  . Diarrhea 01/29/2011   Past Medical History:  Diagnosis Date  . Anxiety and depression   . Back pain   . Diverticulitis   . Diverticulosis   . DVT (deep venous thrombosis) ([place])   . Gout   . Hernia   . History of blood clots   . Hypertension   . Nausea and vomiting   . Neuropathy, peripheral 05/15/2012   patient denies  . Shingles     Family History  Problem Relation Age of Onset  . Prostate cancer Father   . Heart disease Father   . Diabetes Mother   . Heart disease Mother   . Colon cancer Maternal Uncle        dx in his  70's  . Pancreatic cancer Paternal Uncle   . Prostate cancer Paternal Uncle   . Multiple sclerosis Daughter   . Prostate cancer Paternal Uncle   . Esophageal cancer Neg Hx   . Rectal cancer Neg Hx   . Stomach cancer Neg Hx     Past Surgical History:  Procedure Laterality Date  . APPENDECTOMY    . BACK SURGERY    . CHOLECYSTECTOMY  2008  . HERNIA REPAIR    . HIATAL HERNIA REPAIR    . IR ANGIO INTRA EXTRACRAN SEL INTERNAL CAROTID BILAT MOD SED  01/18/2020  . IR ANGIO VERTEBRAL SEL VERTEBRAL BILAT MOD SED  01/18/2020  . IR US GUIDE VASC ACCESS RIGHT  01/18/2020  . LEFT HEART CATHETERIZATION WITH CORONARY ANGIOGRAM N/A 08/02/2014   Procedure:  LEFT HEART CATHETERIZATION WITH CORONARY ANGIOGRAM;  Surgeon: Laverda Page, MD;  Location: [facility] CATH LAB;  Service: Cardiovascular;  Laterality: N/A;  . MULTIPLE EXTRACTIONS WITH ALVEOLOPLASTY N/A 10/19/2015   Procedure: Extraction of tooth #'s 2,12,13 with alveoloplasty;  Surgeon: Lenn Cal, DDS;  Location: [facility];  Service: Oral Surgery;  Laterality: N/A;  . SACRAL NERVE STIMULATOR PLACEMENT  2011   Social History   Occupational History  . Occupation: unemployed    Fish farm manager: OTHER    Employer: RETIRED  Tobacco Use  . Smoking status: Current Every Day Smoker    Packs/day: 0.50    Years: 40.00    Pack years: 20.00    Types: Cigarettes  . Smokeless tobacco: Never Used  Vaping Use  . Vaping Use: Never used  Substance and Sexual Activity  . Alcohol use: Yes    Alcohol/week: 0.0 standard drinks    Comment: Occasional  . Drug use: Yes    Types: Marijuana    Comment: for nausea  . Sexual activity: Not on file

## 2020-03-29 NOTE — Telephone Encounter (Signed)
Phone call to patient to let him know that we have tried multiple times to get in touch with Dr. Jonelle Sidle we sent him a message via Mychart so I told patient that I will speak to [name] [name] to see if she has heard from him today. We will call patient back with an update.

## 2020-03-30 NOTE — Telephone Encounter (Signed)
Noted  

## 2020-04-06 ENCOUNTER — Telehealth: Payer: Self-pay | Admitting: *Deleted

## 2020-04-06 NOTE — Telephone Encounter (Signed)
Phone call to patient to let him know that we are having trouble getting in touch with Dr. Jonelle Sidle. He is going to try to contact him. I also called his office at [phone] and got no answer. I left message for them to call back with a contact for Dr. Jonelle Sidle.

## 2020-04-07 ENCOUNTER — Telehealth: Payer: Self-pay

## 2020-04-07 ENCOUNTER — Telehealth: Payer: Self-pay | Admitting: Physician Assistant

## 2020-04-07 NOTE — Telephone Encounter (Signed)
Submitted VOB, Durolane, bilateral knee. 

## 2020-04-07 NOTE — Telephone Encounter (Signed)
Gala Romney, MD left message on office voice mail that he was returning Arlyss Gandy, PAC's phone call.  (Chart [phone])

## 2020-04-10 ENCOUNTER — Telehealth: Payer: Self-pay

## 2020-04-10 NOTE — Telephone Encounter (Signed)
Approved for Durolane-Bilateral knee Dr. Frederik Pear and [facility] No copay 20% OOP No prior auth required

## 2020-04-10 NOTE — Telephone Encounter (Signed)
Called and scheduled pt

## 2020-04-12 NOTE — Telephone Encounter (Signed)
My chart message sent  Arlyss Gandy, PA-C  You 3 minutes ago (11:09 AM)  Roseanna Rainbow to Dr. Jonelle Sidle on 12/10/19. He will try to get patient to a neuromuscular center that can do the muscle biopsy if they feel it is necessary and then send him to rheumatology from there if needed. No more needs to be done on our end at this times.      Documentation

## 2020-04-12 NOTE — Telephone Encounter (Signed)
Spoke to Dr. Jonelle Sidle on 12/10/19. He will try to get patient to a neuromuscular center that can do the muscle biopsy if they feel it is necessary and then send him to rheumatology from there if needed. No more needs to be done on our end at this times.

## 2020-04-18 ENCOUNTER — Ambulatory Visit (INDEPENDENT_AMBULATORY_CARE_PROVIDER_SITE_OTHER): Payer: Medicare Other | Admitting: Orthopaedic Surgery

## 2020-04-18 DIAGNOSIS — M1712 Unilateral primary osteoarthritis, left knee: Secondary | ICD-10-CM

## 2020-04-18 DIAGNOSIS — M1711 Unilateral primary osteoarthritis, right knee: Secondary | ICD-10-CM

## 2020-04-18 MED ORDER — BUPIVACAINE HCL 0.5 % IJ SOLN
2.0000 mL | INTRAMUSCULAR | Status: AC | PRN
  Administered 2020-04-18: 2 mL via INTRA_ARTICULAR

## 2020-04-18 MED ORDER — LIDOCAINE HCL 1 % IJ SOLN
2.0000 mL | INTRAMUSCULAR | Status: AC | PRN
  Administered 2020-04-18: 2 mL

## 2020-04-18 MED ORDER — SODIUM HYALURONATE 60 MG/3ML IX PRSY
60.0000 mg | PREFILLED_SYRINGE | INTRA_ARTICULAR | Status: AC | PRN
  Administered 2020-04-18: 60 mg via INTRA_ARTICULAR

## 2020-04-18 NOTE — Progress Notes (Signed)
   Procedure Note  Patient: [name] [name]             Date of Birth: Jan 31, 1951           MRN: [id number]             Visit Date: 04/18/2020  Procedures: Visit Diagnoses:  1. Primary osteoarthritis of right knee   2. Primary osteoarthritis of left knee     Large Joint Inj: bilateral knee on 04/18/2020 2:38 PM Indications: pain Details: 22 G needle  Arthrogram: No  Medications (Right): 2 mL lidocaine 1 %; 2 mL bupivacaine 0.5 %; 60 mg Sodium Hyaluronate 60 MG/3ML Medications (Left): 2 mL lidocaine 1 %; 2 mL bupivacaine 0.5 %; 60 mg Sodium Hyaluronate 60 MG/3ML Outcome: tolerated well, no immediate complications Patient was prepped and draped in the usual sterile fashion.

## 2020-05-01 ENCOUNTER — Telehealth: Payer: Self-pay | Admitting: Physician Assistant

## 2020-05-01 NOTE — Telephone Encounter (Signed)
Patient is calling to speak with [name] [name], PAC (J[facility]) about finding a new primary care physician.  Patient says that he has left several messages for Dr. Jonelle Sidle to return his call and to date has not received a return call.  Patient respects JCB's opinion and thinks she could help with this process.

## 2020-05-03 ENCOUNTER — Telehealth: Payer: Self-pay | Admitting: *Deleted

## 2020-05-03 ENCOUNTER — Encounter: Payer: Self-pay | Admitting: *Deleted

## 2020-05-03 NOTE — Telephone Encounter (Signed)
They change so often and we never know who is taking new patients and who is not. I wish I could make a recommendation but I cannot.

## 2020-05-03 NOTE — Telephone Encounter (Signed)
Informed patient [name] [name] bruning didn't know any Primary Care Doctors that are taking new patients

## 2020-05-03 NOTE — Telephone Encounter (Signed)
Tried calling patient, left message telling him that Lars Pinks Bruning didn't know of a Primary Care Doctor.  Told to call back if he needed [place].

## 2020-05-08 ENCOUNTER — Other Ambulatory Visit: Payer: Self-pay

## 2020-05-08 ENCOUNTER — Ambulatory Visit: Payer: Medicare Other | Attending: Neurosurgery | Admitting: Physical Therapy

## 2020-05-08 DIAGNOSIS — M545 Low back pain, unspecified: Secondary | ICD-10-CM

## 2020-05-08 DIAGNOSIS — G8929 Other chronic pain: Secondary | ICD-10-CM | POA: Diagnosis present

## 2020-05-08 DIAGNOSIS — R42 Dizziness and giddiness: Secondary | ICD-10-CM | POA: Diagnosis present

## 2020-05-08 DIAGNOSIS — R2689 Other abnormalities of gait and mobility: Secondary | ICD-10-CM | POA: Insufficient documentation

## 2020-05-08 DIAGNOSIS — M542 Cervicalgia: Secondary | ICD-10-CM

## 2020-05-08 DIAGNOSIS — Z9181 History of falling: Secondary | ICD-10-CM | POA: Diagnosis present

## 2020-05-08 DIAGNOSIS — R2681 Unsteadiness on feet: Secondary | ICD-10-CM | POA: Insufficient documentation

## 2020-05-08 DIAGNOSIS — M6281 Muscle weakness (generalized): Secondary | ICD-10-CM | POA: Insufficient documentation

## 2020-05-09 ENCOUNTER — Telehealth: Payer: Self-pay | Admitting: Physical Therapy
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
## 2020-05-09 NOTE — Addendum Note (Signed)
Addended by: Arliss Journey on: 05/09/2020 06:34 PM   Modules accepted: Orders
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
## 2020-05-11 ENCOUNTER — Ambulatory Visit: Payer: Medicare Other | Attending: Neurosurgery | Admitting: Physical Therapy

## 2020-05-11 DIAGNOSIS — M542 Cervicalgia: Secondary | ICD-10-CM | POA: Insufficient documentation

## 2020-05-11 DIAGNOSIS — M6281 Muscle weakness (generalized): Secondary | ICD-10-CM | POA: Insufficient documentation

## 2020-05-11 DIAGNOSIS — Z9181 History of falling: Secondary | ICD-10-CM | POA: Insufficient documentation

## 2020-05-11 DIAGNOSIS — R2681 Unsteadiness on feet: Secondary | ICD-10-CM | POA: Insufficient documentation

## 2020-05-11 DIAGNOSIS — R2689 Other abnormalities of gait and mobility: Secondary | ICD-10-CM | POA: Insufficient documentation

## 2020-05-11 DIAGNOSIS — R42 Dizziness and giddiness: Secondary | ICD-10-CM | POA: Insufficient documentation

## 2020-05-16 ENCOUNTER — Other Ambulatory Visit: Payer: Self-pay

## 2020-05-16 ENCOUNTER — Ambulatory Visit: Payer: Medicare Other

## 2020-05-16 DIAGNOSIS — R2689 Other abnormalities of gait and mobility: Secondary | ICD-10-CM

## 2020-05-16 DIAGNOSIS — M542 Cervicalgia: Secondary | ICD-10-CM

## 2020-05-16 DIAGNOSIS — R2681 Unsteadiness on feet: Secondary | ICD-10-CM | POA: Diagnosis present

## 2020-05-16 DIAGNOSIS — M6281 Muscle weakness (generalized): Secondary | ICD-10-CM | POA: Diagnosis present

## 2020-05-16 DIAGNOSIS — Z9181 History of falling: Secondary | ICD-10-CM | POA: Diagnosis present

## 2020-05-16 DIAGNOSIS — R42 Dizziness and giddiness: Secondary | ICD-10-CM

## 2020-05-16 NOTE — Patient Instructions (Signed)
Access Code: [email or web address] URL: [email or web address] Date: 05/16/2020 Prepared by: Baldomero Lamy  Exercises Seated Assisted Cervical Rotation with Towel - 2 x daily - 7 x weekly - 1 sets - 10 reps - 10-15 seconds hold
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
## 2020-05-22 ENCOUNTER — Ambulatory Visit: Payer: Medicare Other

## 2020-05-22 ENCOUNTER — Other Ambulatory Visit: Payer: Self-pay

## 2020-05-22 DIAGNOSIS — R2681 Unsteadiness on feet: Secondary | ICD-10-CM | POA: Diagnosis not present

## 2020-05-22 DIAGNOSIS — R42 Dizziness and giddiness: Secondary | ICD-10-CM

## 2020-05-22 DIAGNOSIS — M6281 Muscle weakness (generalized): Secondary | ICD-10-CM

## 2020-05-22 DIAGNOSIS — Z9181 History of falling: Secondary | ICD-10-CM

## 2020-05-22 DIAGNOSIS — M542 Cervicalgia: Secondary | ICD-10-CM
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
## 2020-05-22 NOTE — Patient Instructions (Signed)
Access Code: [email or web address] URL: [email or web address] Date: 05/22/2020 Prepared by: Baldomero Lamy  Exercises Seated Assisted Cervical Rotation with Towel - 2 x daily - 7 x weekly - 1 sets - 10 reps - 10-15 seconds hold Seated Cervical Sidebending Stretch - 1 x daily - 5 x weekly - 1 sets - 3 reps - 30 hold Seated Levator Scapulae Stretch - 1 x daily - 5 x weekly - 1 sets - 3 reps - 30 hold Seated Gaze Stabilization with Head Rotation - 1 x daily - 5 x weekly - 1 sets - 3 reps - 45 seconds hold

## 2020-05-24 ENCOUNTER — Ambulatory Visit: Payer: Medicare Other

## 2020-05-24 ENCOUNTER — Other Ambulatory Visit: Payer: Self-pay

## 2020-05-24 DIAGNOSIS — R2681 Unsteadiness on feet: Secondary | ICD-10-CM

## 2020-05-24 DIAGNOSIS — M542 Cervicalgia: Secondary | ICD-10-CM

## 2020-05-24 DIAGNOSIS — M6281 Muscle weakness (generalized): Secondary | ICD-10-CM

## 2020-05-24 DIAGNOSIS — R42 Dizziness and giddiness: Secondary | ICD-10-CM

## 2020-05-24 DIAGNOSIS — R2689 Other abnormalities of gait and mobility: Secondary | ICD-10-CM
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
## 2020-05-29 ENCOUNTER — Ambulatory Visit: Payer: Medicare Other

## 2020-05-31 ENCOUNTER — Telehealth: Payer: Self-pay

## 2020-05-31 ENCOUNTER — Ambulatory Visit: Payer: Medicare Other

## 2020-05-31 NOTE — Telephone Encounter (Signed)
PT returned patient's phone call. Patient reporting that he was able to see opthalmology and was diagnosed with glaucoma. Will be following up with specialist in a few weeks. Patient reporting that he is also going to see PCP regarding headache. Scheduled PT visits cancelled this week due to increased headaches. PT educating patient on next scheduled visit.    Guillermina [place], PT, [place]

## 2020-06-03 ENCOUNTER — Encounter: Payer: Self-pay | Admitting: Physical Therapy

## 2020-06-07 ENCOUNTER — Ambulatory Visit: Payer: Medicare Other

## 2020-06-12 ENCOUNTER — Ambulatory Visit: Payer: Medicare Other | Attending: Neurosurgery | Admitting: Physical Therapy

## 2020-06-12 ENCOUNTER — Encounter: Payer: Self-pay | Admitting: Physical Therapy

## 2020-06-12 ENCOUNTER — Other Ambulatory Visit: Payer: Self-pay

## 2020-06-12 DIAGNOSIS — R42 Dizziness and giddiness: Secondary | ICD-10-CM | POA: Diagnosis present

## 2020-06-12 DIAGNOSIS — M545 Low back pain, unspecified: Secondary | ICD-10-CM | POA: Insufficient documentation

## 2020-06-12 DIAGNOSIS — M542 Cervicalgia: Secondary | ICD-10-CM | POA: Diagnosis present

## 2020-06-12 DIAGNOSIS — Z9181 History of falling: Secondary | ICD-10-CM | POA: Insufficient documentation

## 2020-06-12 DIAGNOSIS — M6281 Muscle weakness (generalized): Secondary | ICD-10-CM | POA: Insufficient documentation

## 2020-06-12 DIAGNOSIS — R2689 Other abnormalities of gait and mobility: Secondary | ICD-10-CM | POA: Diagnosis present

## 2020-06-12 DIAGNOSIS — R2681 Unsteadiness on feet: Secondary | ICD-10-CM | POA: Diagnosis not present

## 2020-06-12 DIAGNOSIS — G8929 Other chronic pain: Secondary | ICD-10-CM | POA: Diagnosis present
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
## 2020-06-14 ENCOUNTER — Other Ambulatory Visit: Payer: Self-pay

## 2020-06-14 ENCOUNTER — Ambulatory Visit: Payer: Medicare Other

## 2020-06-14 DIAGNOSIS — R2681 Unsteadiness on feet: Secondary | ICD-10-CM | POA: Diagnosis not present

## 2020-06-14 DIAGNOSIS — M6281 Muscle weakness (generalized): Secondary | ICD-10-CM

## 2020-06-14 DIAGNOSIS — R2689 Other abnormalities of gait and mobility: Secondary | ICD-10-CM

## 2020-06-14 DIAGNOSIS — M542 Cervicalgia: Secondary | ICD-10-CM

## 2020-06-14 DIAGNOSIS — R42 Dizziness and giddiness: Secondary | ICD-10-CM
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
## 2020-06-14 NOTE — Patient Instructions (Signed)
Access Code: [email or web address] URL: [email or web address] Date: 06/14/2020 Prepared by: Baldomero Lamy  Exercises Seated Assisted Cervical Rotation with Towel - 2 x daily - 7 x weekly - 1 sets - 10 reps - 10-15 seconds hold Seated Cervical Sidebending Stretch - 1 x daily - 5 x weekly - 1 sets - 3 reps - 30 hold Seated Levator Scapulae Stretch - 1 x daily - 5 x weekly - 1 sets - 3 reps - 30 hold Seated Gaze Stabilization with Head Rotation - 1 x daily - 5 x weekly - 1 sets - 3 reps - 45 seconds hold Romberg Stance on Foam Pad with Head Rotation - 1 x daily - 5 x weekly - 2 sets - 10 reps Romberg Stance Eyes Closed on Foam Pad - 1 x daily - 5 x weekly - 1 sets - 3 reps - 30 hold Tandem Stance - 1 x daily - 5 x weekly - 1 sets - 3 reps - 30 hold

## 2020-06-19 ENCOUNTER — Other Ambulatory Visit: Payer: Self-pay

## 2020-06-19 ENCOUNTER — Encounter: Payer: Self-pay | Admitting: Physical Therapy

## 2020-06-19 ENCOUNTER — Ambulatory Visit: Payer: Medicare Other | Admitting: Physical Therapy

## 2020-06-19 DIAGNOSIS — M542 Cervicalgia: Secondary | ICD-10-CM

## 2020-06-19 DIAGNOSIS — R2689 Other abnormalities of gait and mobility: Secondary | ICD-10-CM

## 2020-06-19 DIAGNOSIS — R2681 Unsteadiness on feet: Secondary | ICD-10-CM

## 2020-06-19 DIAGNOSIS — M6281 Muscle weakness (generalized): Secondary | ICD-10-CM

## 2020-06-19 DIAGNOSIS — R42 Dizziness and giddiness: Secondary | ICD-10-CM
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
## 2020-06-21 ENCOUNTER — Ambulatory Visit: Payer: Medicare Other

## 2020-06-28 ENCOUNTER — Ambulatory Visit: Payer: Medicare Other

## 2020-06-30 ENCOUNTER — Ambulatory Visit: Payer: Medicare Other

## 2020-07-03 ENCOUNTER — Ambulatory Visit: Payer: Medicare Other | Admitting: Physical Therapy

## 2020-07-05 ENCOUNTER — Ambulatory Visit: Payer: Medicare Other

## 2020-08-01 ENCOUNTER — Other Ambulatory Visit: Payer: Self-pay

## 2020-08-01 ENCOUNTER — Ambulatory Visit (INDEPENDENT_AMBULATORY_CARE_PROVIDER_SITE_OTHER): Payer: Medicare Other | Admitting: Orthopaedic Surgery

## 2020-08-01 DIAGNOSIS — I2782 Chronic pulmonary embolism: Secondary | ICD-10-CM

## 2020-08-01 DIAGNOSIS — M1711 Unilateral primary osteoarthritis, right knee: Secondary | ICD-10-CM | POA: Diagnosis not present

## 2020-08-01 DIAGNOSIS — I82503 Chronic embolism and thrombosis of unspecified deep veins of lower extremity, bilateral: Secondary | ICD-10-CM

## 2020-08-01 MED ORDER — TRAMADOL HCL 50 MG PO TABS: 50.0000 mg | ORAL_TABLET | Freq: Every day | ORAL | 0 refills | Status: DC | PRN

## 2020-08-01 NOTE — Progress Notes (Signed)
Office Visit Note   Patient: [name] [name]           Date of Birth: 05-Jun-1951           MRN: [id number] Visit Date: 08/01/2020              Requested by: Elwyn Reach, MD [place],  [place] [address] PCP: Elwyn Reach, MD   Assessment & Plan: Visit Diagnoses:  1. Primary osteoarthritis of right knee   2. Chronic deep vein thrombosis (DVT) of both lower extremities, unspecified vein (HCC)   3. Other chronic pulmonary embolism without acute cor pulmonale (HCC)     Plan: Impression is end-stage right knee DJD.  Unfortunately [name] [name] has not been able to find significant relief through nonsurgical treatments.  We had a discussion on total knee replacement and the associated risk benefits rehab recovery.  He will think about this and let us know if he is interested in undergoing the surgery.  He is on Xarelto chronically for history of bilateral PEs.  He would need preoperative clearance from PCP Dr. Jonelle Sidle if he decides to have surgery.  Tramadol prescribed today.  Questions encouraged and answered.  Follow-Up Instructions: No follow-ups on file.   Orders:  No orders of the defined types were placed in this encounter.  Meds ordered this encounter  Medications  . traMADol (ULTRAM) 50 MG tablet    Sig: Take 1-2 tablets (50-100 mg total) by mouth daily as needed.    Dispense:  20 tablet    Refill:  0      Procedures: No procedures performed   Clinical Data: No additional findings.   Subjective: No chief complaint on file.   [name] [name] returns today for follow-up of his chronic right knee pain.  He states that the Durolane injection helped only partially.  He continues to have pain and fear of falling and giving way.  He has been on Xarelto since the 90s for bilateral PEs.  Denies any changes in medical history recently.  He feels that he has no quality of life due to his knee pain.   Review of Systems   Objective: Vital Signs: There were no  vitals taken for this visit.  Physical Exam  Ortho Exam Right knee shows a trace joint effusion.  2+ patellofemoral crepitus.  Collaterals and cruciates are stable. Specialty Comments:  No specialty comments available.  Imaging: No results found.   PMFS History: Patient Active Problem List   Diagnosis Date Noted  . Primary osteoarthritis of left knee 03/29/2020  . Primary osteoarthritis of right knee 07/08/2017  . Complete rotator cuff tear of left shoulder 10/21/2016  . Gout attack 04/02/2016  . Angina pectoris ([place]) 08/01/2014  . Hemorrhage of rectum and anus 03/28/2014  . Neuropathy, peripheral 05/15/2012  . Pulmonary embolism ([place]) 07/11/2011  . DVT (deep venous thrombosis) ([facility] [place]) 07/11/2011  . Abdominal pain, left lower quadrant 01/29/2011  . Long term current use of anticoagulant therapy 01/29/2011  . Diarrhea 01/29/2011   Past Medical History:  Diagnosis Date  . Anxiety and depression   . Back pain   . Diverticulitis   . Diverticulosis   . DVT (deep venous thrombosis) ([place])   . Gout   . Hernia   . History of blood clots   . Hypertension   . Nausea and vomiting   . Neuropathy, peripheral 05/15/2012   patient denies  . Shingles     Family History  Problem  Relation Age of Onset  . Prostate cancer Father   . Heart disease Father   . Diabetes Mother   . Heart disease Mother   . Colon cancer Maternal Uncle        dx in his 18's  . Pancreatic cancer Paternal Uncle   . Prostate cancer Paternal Uncle   . Multiple sclerosis Daughter   . Prostate cancer Paternal Uncle   . Esophageal cancer Neg Hx   . Rectal cancer Neg Hx   . Stomach cancer Neg Hx     Past Surgical History:  Procedure Laterality Date  . APPENDECTOMY    . BACK SURGERY    . CHOLECYSTECTOMY  2008  . HERNIA REPAIR    . HIATAL HERNIA REPAIR    . IR ANGIO INTRA EXTRACRAN SEL INTERNAL CAROTID BILAT MOD SED  01/18/2020  . IR ANGIO VERTEBRAL SEL VERTEBRAL BILAT MOD SED  01/18/2020  . IR US GUIDE  VASC ACCESS RIGHT  01/18/2020  . LEFT HEART CATHETERIZATION WITH CORONARY ANGIOGRAM N/A 08/02/2014   Procedure: LEFT HEART CATHETERIZATION WITH CORONARY ANGIOGRAM;  Surgeon: Laverda Page, MD;  Location: [facility] CATH LAB;  Service: Cardiovascular;  Laterality: N/A;  . MULTIPLE EXTRACTIONS WITH ALVEOLOPLASTY N/A 10/19/2015   Procedure: Extraction of tooth #'s 2,12,13 with alveoloplasty;  Surgeon: Lenn Cal, DDS;  Location: [facility];  Service: Oral Surgery;  Laterality: N/A;  . SACRAL NERVE STIMULATOR PLACEMENT  2011   Social History   Occupational History  . Occupation: unemployed    Fish farm manager: OTHER    Employer: RETIRED  Tobacco Use  . Smoking status: Current Every Day Smoker    Packs/day: 0.50    Years: 40.00    Pack years: 20.00    Types: Cigarettes  . Smokeless tobacco: Never Used  Vaping Use  . Vaping Use: Never used  Substance and Sexual Activity  . Alcohol use: Yes    Alcohol/week: 0.0 standard drinks    Comment: Occasional  . Drug use: Yes    Types: Marijuana    Comment: for nausea  . Sexual activity: Not on file

## 2020-08-10 ENCOUNTER — Other Ambulatory Visit: Payer: Self-pay | Admitting: Orthopaedic Surgery

## 2020-08-10 NOTE — Telephone Encounter (Signed)
Can you send in?

## 2020-08-11 NOTE — Telephone Encounter (Signed)
Thank you :)

## 2020-08-11 NOTE — Telephone Encounter (Signed)
Called into pharm  

## 2020-09-15 NOTE — Therapy (Signed)
[facility] [address] [place], [place], [address] Phone: [phone]   Fax:  [phone]  Patient Details  Name: [name] [name] MRN: [id number] Date of Birth: Mar 12, 1951 Referring Provider:  No ref. provider found  Encounter Date: 09/15/2020  PHYSICAL THERAPY DISCHARGE SUMMARY/NON VISIT DISCHARGE  Visits from Start of Care: 7  Current functional level related to goals / functional outcomes: Patient discharged from Neuro OPPT services at patient's request to personal matters. At last visit patient continued to present with pain, increased visual symptoms, decreased strength, postural dysfunction, and decreased activity tolerance due to Concussion. Patient had meet/partially met all STG, LTG had not been assessed.    Remaining deficits: See above   Education / Equipment: Patient had HEP upon discharge.   Plan: Patient agrees to discharge.  Patient goals were not met. Patient is being discharged due to not returning since the last visit.  Patient had meet/partially met all STG, LTG had not been assessed.???       Jones Bales, PT, DPT 09/15/2020, 8:40 AM  [facility] [address] [place], [place], [address] Phone: [phone]   Fax:  [phone]

## 2020-09-26 ENCOUNTER — Other Ambulatory Visit: Payer: Self-pay | Admitting: Orthopaedic Surgery

## 2020-09-26 NOTE — Telephone Encounter (Signed)
Please advise 

## 2020-09-26 NOTE — Telephone Encounter (Signed)
Ok to refill with same previous instructions

## 2020-10-11 ENCOUNTER — Ambulatory Visit (INDEPENDENT_AMBULATORY_CARE_PROVIDER_SITE_OTHER): Payer: Medicare Other | Admitting: Physician Assistant

## 2020-10-11 ENCOUNTER — Encounter: Payer: Self-pay | Admitting: Physician Assistant

## 2020-10-11 ENCOUNTER — Other Ambulatory Visit: Payer: Self-pay

## 2020-10-11 VITALS — BP 128/70 | HR 66 | Ht 73.0 in | Wt 225.2 lb

## 2020-10-11 DIAGNOSIS — R194 Change in bowel habit: Secondary | ICD-10-CM | POA: Diagnosis not present

## 2020-10-11 DIAGNOSIS — K219 Gastro-esophageal reflux disease without esophagitis: Secondary | ICD-10-CM

## 2020-10-11 DIAGNOSIS — R1084 Generalized abdominal pain: Secondary | ICD-10-CM | POA: Diagnosis not present

## 2020-10-11 MED ORDER — PANTOPRAZOLE SODIUM 40 MG PO TBEC: 40.0000 mg | DELAYED_RELEASE_TABLET | Freq: Two times a day (BID) | ORAL | 5 refills | Status: DC
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
## 2020-10-11 NOTE — Patient Instructions (Signed)
If you are age 71 or older, your body mass index should be between 23-30. Your Body mass index is 29.72 kg/m. If this is out of the aforementioned range listed, please consider follow up with your Primary Care Provider.  If you are age 97 or younger, your body mass index should be between 19-25. Your Body mass index is 29.72 kg/m. If this is out of the aformentioned range listed, please consider follow up with your Primary Care Provider.   We have sent the following medications to your pharmacy for you to pick up at your convenience: Pantoprazole 40 mg   Stop Omeprazole   Stop Marijuana use  Thank you for choosing me and [facility].  Ellouise Newer, PA-C

## 2020-10-12 NOTE — Progress Notes (Signed)
____________________________________________________________  Attending physician addendum:  Thank you for sending this case to me. I have reviewed the entire note and agree with the plan.   Aneka Fagerstrom Danis, MD  ____________________________________________________________  

## 2020-11-07 ENCOUNTER — Other Ambulatory Visit (INDEPENDENT_AMBULATORY_CARE_PROVIDER_SITE_OTHER): Payer: Self-pay | Admitting: Physician Assistant

## 2020-11-23 ENCOUNTER — Other Ambulatory Visit: Payer: Self-pay

## 2020-11-23 ENCOUNTER — Encounter: Payer: Self-pay | Admitting: Plastic Surgery

## 2020-11-23 ENCOUNTER — Ambulatory Visit (INDEPENDENT_AMBULATORY_CARE_PROVIDER_SITE_OTHER): Payer: Medicare Other | Admitting: Plastic Surgery

## 2020-11-23 VITALS — BP 140/82 | HR 80 | Ht 76.0 in | Wt 225.0 lb

## 2020-11-23 DIAGNOSIS — D171 Benign lipomatous neoplasm of skin and subcutaneous tissue of trunk: Secondary | ICD-10-CM

## 2020-11-23 NOTE — Progress Notes (Signed)
Referring Provider Elwyn Reach, [place],  [place] [address]   CC:  Chief Complaint  Patient presents with  . consult      [name] [name] is an 70 y.o. male.  HPI: Patient presents with several palpable and painful soft tissue masses.  He has 1 in his left chest.  In his bilateral anterior abdominal wall.  He also claims he has some in his mid and upper back.  He says they fluctuate in size and are painful to the touch.  He wants to see if they can be removed.  Allergies  Allergen Reactions  . Aspirin Other (See Comments)    ulcers  . Celecoxib Other (See Comments)    Stomach irritation  . Ibuprofen Other (See Comments)    ulcers  . Lyrica [Pregabalin] Swelling  . Nsaids   . Vicodin [Hydrocodone-Acetaminophen]     Makes me mean     Outpatient Encounter Medications as of 11/23/2020  Medication Sig  . albuterol (PROVENTIL HFA;VENTOLIN HFA) 108 (90 Base) MCG/ACT inhaler Inhale 2 puffs into the lungs every 6 (six) hours as needed for wheezing or shortness of breath.  Marland Kitchen amoxicillin-clavulanate (AUGMENTIN) 875-125 MG tablet Take 1 tablet by mouth 2 (two) times daily.  . cetirizine (ZYRTEC) 10 MG tablet Take 10 mg by mouth as needed.  . dicyclomine (BENTYL) 10 MG capsule Take 1 capsule (10 mg total) by mouth 3 (three) times daily as needed for spasms.  Marland Kitchen doxycycline (VIBRAMYCIN) 100 MG capsule Take 1 capsule (100 mg total) by mouth 2 (two) times daily.  . DULoxetine (CYMBALTA) 60 MG capsule Take 120 mg by mouth daily.   . fluticasone (FLONASE) 50 MCG/ACT nasal spray Place 1 spray into both nostrils 2 (two) times daily.  . metoprolol tartrate (LOPRESSOR) 25 MG tablet Take 50 mg by mouth 2 (two) times daily.   Marland Kitchen NIFEDICAL XL 60 MG 24 hr tablet Take 120 mg by mouth daily.   . pantoprazole (PROTONIX) 40 MG tablet Take 1 tablet (40 mg total) by mouth 2 (two) times daily.  . rivaroxaban (XARELTO) 20 MG TABS tablet Take 1 tablet (20 mg total) by mouth daily with  supper. Please follow up with Primary Care MD for future refills  . traMADol (ULTRAM) 50 MG tablet TAKE 1 TO 2 TABLET BY MOUTH EVERY DAY AS NEEDED FOR PAIN  . VOLTAREN 1 % GEL Apply 2 g topically daily as needed (pain). For neck and shoulder pain  . [DISCONTINUED] isosorbide mononitrate (IMDUR) 60 MG 24 hr tablet Take 60 mg by mouth daily.   No facility-administered encounter medications on file as of 11/23/2020.     Past Medical History:  Diagnosis Date  . Anxiety and depression   . Back pain   . Diverticulitis   . Diverticulosis   . DVT (deep venous thrombosis) ([place])   . Gout   . Hernia   . History of blood clots   . Hypertension   . Nausea and vomiting   . Neuropathy, peripheral 05/15/2012   patient denies  . Shingles     Past Surgical History:  Procedure Laterality Date  . APPENDECTOMY    . BACK SURGERY    . CHOLECYSTECTOMY  2008  . HERNIA REPAIR    . HIATAL HERNIA REPAIR    . IR ANGIO INTRA EXTRACRAN SEL INTERNAL CAROTID BILAT MOD SED  01/18/2020  . IR ANGIO VERTEBRAL SEL VERTEBRAL BILAT MOD SED  01/18/2020  . IR US GUIDE  VASC ACCESS RIGHT  01/18/2020  . LEFT HEART CATHETERIZATION WITH CORONARY ANGIOGRAM N/A 08/02/2014   Procedure: LEFT HEART CATHETERIZATION WITH CORONARY ANGIOGRAM;  Surgeon: Laverda Page, MD;  Location: [facility] C[facility] LAB;  Service: Cardiovascular;  Laterality: N/A;  . MULTIPLE EXTRACTIONS WITH ALVEOLOPLASTY N/A 10/19/2015   Procedure: Extraction of tooth #'s 2,12,13 with alveoloplasty;  Surgeon: Lenn Cal, DDS;  Location: [facility];  Service: Oral Surgery;  Laterality: N/A;  . SACRAL NERVE STIMULATOR PLACEMENT  2011    Family History  Problem Relation Age of Onset  . Prostate cancer Father   . Heart disease Father   . Diabetes Mother   . Heart disease Mother   . Colon cancer Maternal Uncle        dx in his 22's  . Pancreatic cancer Paternal Uncle   . Prostate cancer Paternal Uncle   . Multiple sclerosis Daughter   . Prostate cancer Paternal Uncle    . Esophageal cancer Neg Hx   . Rectal cancer Neg Hx   . Stomach cancer Neg Hx     Social History   Social History Narrative   Patient lives at home with his family.   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    Caffeine use: 1-2 cups daily     Review of Systems General: Denies fevers, chills, weight loss CV: Denies chest pain, shortness of breath, palpitations  Physical Exam Vitals with BMI 11/23/2020 10/11/2020 03/29/2020  Height 6\' 4"  6\' 1"  6\' 2"   Weight 225 lbs 225 lbs 4 oz 232 lbs 3 oz  BMI 27.4 59.45 85.9  Systolic 292 446 -  Diastolic 82 70 -  Pulse 80 66 -    General:  No acute distress,  Alert and oriented, Non-Toxic, Normal speech and affect Examination shows small 1 to 1.5 cm subcutaneous masses in the left chest and bilateral anterior abdominal wall.  It feels like there is several small masses in the abdominal areas.  I have a difficult time feeling anything obvious on his upper and mid back but he definitely is tender in these locations.  I do not see any overlying skin changes in any of the locations.  Assessment/Plan Due to the symptoms in the fact that I can palpate and locate them the chest and abdominal wall masses I think are good candidates for excision.  I discussed the risks include bleeding, infection, damage to surrounding structures and need for additional procedures.  All of his questions were answered we will plan to move forward with removing these.  Regarding the backup plan to send for ultrasound for evaluation.  I cannot readily appreciate them although the patient claims that they do increase in size and can become much more obvious at those times.  We will see if the ultrasound can help with the diagnosis.  [name] 11/23/2020, 12:24 PM

## 2020-11-28 ENCOUNTER — Other Ambulatory Visit: Payer: Self-pay | Admitting: Plastic Surgery

## 2020-11-28 DIAGNOSIS — D171 Benign lipomatous neoplasm of skin and subcutaneous tissue of trunk: Secondary | ICD-10-CM

## 2020-12-08 ENCOUNTER — Ambulatory Visit: Payer: Medicare Other | Admitting: Gastroenterology

## 2020-12-13 ENCOUNTER — Other Ambulatory Visit: Payer: Self-pay | Admitting: Plastic Surgery

## 2020-12-13 DIAGNOSIS — D171 Benign lipomatous neoplasm of skin and subcutaneous tissue of trunk: Secondary | ICD-10-CM

## 2020-12-25 ENCOUNTER — Ambulatory Visit (HOSPITAL_COMMUNITY): Payer: Medicare Other

## 2021-01-04 ENCOUNTER — Ambulatory Visit: Payer: Medicare Other | Admitting: Plastic Surgery

## 2021-01-04 ENCOUNTER — Other Ambulatory Visit (HOSPITAL_COMMUNITY)
Admission: RE | Admit: 2021-01-04 | Discharge: 2021-01-04 | Disposition: A | Discharge status: Home / Self Care | Payer: Medicare Other | Source: Ambulatory Visit | Attending: Plastic Surgery | Admitting: Plastic Surgery

## 2021-01-04 ENCOUNTER — Other Ambulatory Visit: Payer: Self-pay

## 2021-01-04 ENCOUNTER — Ambulatory Visit (INDEPENDENT_AMBULATORY_CARE_PROVIDER_SITE_OTHER): Payer: Medicare Other | Admitting: Plastic Surgery

## 2021-01-04 DIAGNOSIS — D171 Benign lipomatous neoplasm of skin and subcutaneous tissue of trunk: Secondary | ICD-10-CM | POA: Diagnosis not present

## 2021-01-04 NOTE — Progress Notes (Signed)
Operative Note   DATE OF OPERATION: 01/04/2021  LOCATION:    SURGICAL DEPARTMENT: Plastic Surgery  PREOPERATIVE DIAGNOSES: Abdominal wall lipomas  POSTOPERATIVE DIAGNOSES:  same  PROCEDURE:  1. Excision of 3 abdominal wall lipomas measuring 2.5 cm each 2. Complex closure measuring 7.5 cm  SURGEON: Talmadge Coventry, MD  ANESTHESIA:  Local  COMPLICATIONS: None.   INDICATIONS FOR PROCEDURE:  The patient, [name] [name] is a 70 y.o. male born on [birth year], is here for treatment of painful abdominal wall lipomas MRN: [id number]  CONSENT:  Informed consent was obtained directly from the patient. Risks, benefits and alternatives were fully discussed. Specific risks including but not limited to bleeding, infection, hematoma, seroma, scarring, pain, infection, wound healing problems, and need for further surgery were all discussed. The patient did have an ample opportunity to have questions answered to satisfaction.   DESCRIPTION OF PROCEDURE:  Local anesthesia was administered. The patient's operative site was prepped and draped in a sterile fashion. A time out was performed and all information was confirmed to be correct.  The lesions were excised with a 15 blade and tenotomy dissection.  They all appear to be subcutaneous angiolipomas.  Hemostasis was obtained.  Circumferential undermining was performed and the skin was advanced and closed in layers with interrupted buried Monocryl sutures and 5-0 fast gut for the skin.    The patient tolerated the procedure well.  There were no complications.

## 2021-01-08 LAB — SURGICAL PATHOLOGY

## 2021-01-12 ENCOUNTER — Ambulatory Visit (HOSPITAL_COMMUNITY): Payer: Medicare Other

## 2021-01-12 ENCOUNTER — Encounter (HOSPITAL_COMMUNITY): Payer: Self-pay

## 2021-01-23 ENCOUNTER — Other Ambulatory Visit: Payer: Self-pay

## 2021-01-23 ENCOUNTER — Ambulatory Visit (HOSPITAL_COMMUNITY)
Admission: RE | Admit: 2021-01-23 | Discharge: 2021-01-23 | Disposition: A | Discharge status: Home / Self Care | Payer: Medicare Other | Source: Ambulatory Visit | Attending: Plastic Surgery | Admitting: Plastic Surgery

## 2021-01-23 DIAGNOSIS — D171 Benign lipomatous neoplasm of skin and subcutaneous tissue of trunk: Secondary | ICD-10-CM | POA: Insufficient documentation

## 2021-01-24 ENCOUNTER — Telehealth: Payer: Self-pay

## 2021-01-24 ENCOUNTER — Encounter: Payer: Self-pay | Admitting: Plastic Surgery

## 2021-01-24 NOTE — Telephone Encounter (Signed)
Patient called to say that his right side is hurting where Dr. Claudia Desanctis went in and it's red and swollen.  Patient said that yesterday he had a temperature of 101 and he still feels warm today.  Patient said that he has been taking Tylenol, 3-4 pills per dose since his procedure, but it doesn't seem to be helping.  Please call.

## 2021-01-24 NOTE — Telephone Encounter (Signed)
Called and LMOM asking the patient to give the office a call back to schedule an appointment today to see [facility] regarding the message below.//AB/CMA

## 2021-02-13 ENCOUNTER — Other Ambulatory Visit: Payer: Self-pay

## 2021-02-13 ENCOUNTER — Emergency Department (HOSPITAL_COMMUNITY)
Admission: EM | Admit: 2021-02-13 | Discharge: 2021-02-13 | Disposition: A | Discharge status: Home / Self Care | Payer: Medicare Other | Attending: Emergency Medicine | Admitting: Emergency Medicine

## 2021-02-13 ENCOUNTER — Emergency Department (HOSPITAL_COMMUNITY): Payer: Medicare Other

## 2021-02-13 DIAGNOSIS — R110 Nausea: Secondary | ICD-10-CM | POA: Diagnosis not present

## 2021-02-13 DIAGNOSIS — Z79899 Other long term (current) drug therapy: Secondary | ICD-10-CM | POA: Insufficient documentation

## 2021-02-13 DIAGNOSIS — R109 Unspecified abdominal pain: Secondary | ICD-10-CM | POA: Diagnosis present

## 2021-02-13 DIAGNOSIS — R300 Dysuria: Secondary | ICD-10-CM | POA: Insufficient documentation

## 2021-02-13 DIAGNOSIS — Z7901 Long term (current) use of anticoagulants: Secondary | ICD-10-CM | POA: Diagnosis not present

## 2021-02-13 DIAGNOSIS — F17210 Nicotine dependence, cigarettes, uncomplicated: Secondary | ICD-10-CM | POA: Diagnosis not present

## 2021-02-13 DIAGNOSIS — I10 Essential (primary) hypertension: Secondary | ICD-10-CM | POA: Diagnosis not present

## 2021-02-13 LAB — CBC WITH DIFFERENTIAL/PLATELET
Abs Immature Granulocytes: 0.01 10*3/uL (ref 0.00–0.07)
Basophils Absolute: 0 10*3/uL (ref 0.0–0.1)
Basophils Relative: 0 %
Eosinophils Absolute: 0.1 10*3/uL (ref 0.0–0.5)
Eosinophils Relative: 1 %
HCT: 42.3 % (ref 39.0–52.0)
Hemoglobin: 13.6 g/dL (ref 13.0–17.0)
Immature Granulocytes: 0 %
Lymphocytes Relative: 30 %
Lymphs Abs: 1.9 10*3/uL (ref 0.7–4.0)
MCH: 23.6 pg — ABNORMAL LOW (ref 26.0–34.0)
MCHC: 32.2 g/dL (ref 30.0–36.0)
MCV: 73.4 fL — ABNORMAL LOW (ref 80.0–100.0)
Monocytes Absolute: 0.6 10*3/uL (ref 0.1–1.0)
Monocytes Relative: 9 %
Neutro Abs: 3.8 10*3/uL (ref 1.7–7.7)
Neutrophils Relative %: 60 %
Platelets: 277 10*3/uL (ref 150–400)
RBC: 5.76 MIL/uL (ref 4.22–5.81)
RDW: 16.5 % — ABNORMAL HIGH (ref 11.5–15.5)
WBC: 6.4 10*3/uL (ref 4.0–10.5)
nRBC: 0 % (ref 0.0–0.2)

## 2021-02-13 LAB — COMPREHENSIVE METABOLIC PANEL
ALT: 16 U/L (ref 0–44)
AST: 18 U/L (ref 15–41)
Albumin: 3.9 g/dL (ref 3.5–5.0)
Alkaline Phosphatase: 37 U/L — ABNORMAL LOW (ref 38–126)
Anion gap: 9 (ref 5–15)
BUN: 12 mg/dL (ref 8–23)
CO2: 29 mmol/L (ref 22–32)
Calcium: 9.3 mg/dL (ref 8.9–10.3)
Chloride: 100 mmol/L (ref 98–111)
Creatinine, Ser: 1.11 mg/dL (ref 0.61–1.24)
GFR, Estimated: >60 mL/min (ref >60)
Glucose, Bld: 103 mg/dL — ABNORMAL HIGH (ref 70–99)
Potassium: 3.3 mmol/L — ABNORMAL LOW (ref 3.5–5.1)
Sodium: 138 mmol/L (ref 135–145)
Total Bilirubin: 0.8 mg/dL (ref 0.3–1.2)
Total Protein: 6.2 g/dL — ABNORMAL LOW (ref 6.5–8.1)

## 2021-02-13 LAB — URINALYSIS, ROUTINE W REFLEX MICROSCOPIC
Bilirubin Urine: NEGATIVE
Glucose, UA: NEGATIVE mg/dL
Hgb urine dipstick: NEGATIVE
Ketones, ur: NEGATIVE mg/dL
Leukocytes,Ua: NEGATIVE
Nitrite: NEGATIVE
Protein, ur: NEGATIVE mg/dL
Specific Gravity, Urine: >1.046 — ABNORMAL HIGH (ref 1.005–1.030)
pH: 6 (ref 5.0–8.0)

## 2021-02-13 LAB — LIPASE, BLOOD: Lipase: 28 U/L (ref 11–51)

## 2021-02-13 MED ORDER — OXYCODONE-ACETAMINOPHEN 5-325 MG PO TABS: 1.0000 | ORAL_TABLET | Freq: Four times a day (QID) | ORAL | 0 refills | Status: DC | PRN

## 2021-02-13 MED ORDER — FENTANYL CITRATE (PF) 100 MCG/2ML IJ SOLN
50.0000 ug | Freq: Once | INTRAMUSCULAR | Status: AC
Start: 2021-02-13 — End: 2021-02-13
  Administered 2021-02-13: 50 ug via INTRAVENOUS
  Filled 2021-02-13: qty 2

## 2021-02-13 MED ORDER — OXYCODONE-ACETAMINOPHEN 5-325 MG PO TABS
1.0000 | ORAL_TABLET | Freq: Once | ORAL | Status: AC
  Administered 2021-02-13: 1 via ORAL
  Filled 2021-02-13: qty 1

## 2021-02-13 MED ORDER — CEPHALEXIN 500 MG PO CAPS: 500.0000 mg | ORAL_CAPSULE | Freq: Four times a day (QID) | ORAL | 0 refills | Status: AC

## 2021-02-13 MED ORDER — ONDANSETRON HCL 4 MG/2ML IJ SOLN
4.0000 mg | Freq: Once | INTRAMUSCULAR | Status: AC
  Administered 2021-02-13: 4 mg via INTRAVENOUS
  Filled 2021-02-13: qty 2

## 2021-02-13 MED ORDER — IOHEXOL 350 MG/ML SOLN
100.0000 mL | Freq: Once | INTRAVENOUS | Status: AC | PRN
  Administered 2021-02-13: 100 mL via INTRAVENOUS

## 2021-02-13 NOTE — ED Notes (Signed)
Messaged attending provider per pt request, pt requests "another dose of pain medication". Rates pain 7/10 on 0-10 pain scale.

## 2021-02-13 NOTE — ED Provider Notes (Signed)
[facility] EMERGENCY DEPARTMENT Provider Note   CSN: [id number] Arrival date & time: 02/13/21  0932     History Chief Complaint  Patient presents with  . Flank Pain    [name] [name] is a 70 y.o. male presenting for evaluation of right-sided flank pain.  Patient states of the past week, he has had gradually worsening pain.  It is on his right side, starting in his right flank and extending to his right lower abdomen.  He has associated dysuria and abnormal urinary smell.  He has associated nausea, no vomiting. Associated chills/ He states pain sometimes goes to his right shoulder and into his right leg.  He denies history of kidney stones.  No chest pain, no cough.  No abnormal bowel movements.  He has taken tramadol and Tylenol without improvement symptoms, has not taken anything else.  Status post cholecystectomy and appendectomy.  Additional history taken chart review.  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 Anxiety and depression   . Back pain   . Diverticulitis   . Diverticulosis   . DVT (deep venous thrombosis) ([facility])   . Gout   . Hernia   . History of blood clots   . Hypertension   . Nausea and vomiting   . Neuropathy, peripheral 05/15/2012   patient denies  . Shingles     Patient Active Problem List   Diagnosis Date Noted  . Primary osteoarthritis of left knee 03/29/2020  . Primary osteoarthritis of right knee 07/08/2017  . Complete rotator cuff tear of left shoulder 10/21/2016  . Gout attack 04/02/2016  . Angina pectoris ([facility]) 08/01/2014  . Hemorrhage of rectum and anus 03/28/2014  . Neuropathy, peripheral 05/15/2012  . Pulmonary embolism ([facility]) 07/11/2011  . DVT (deep venous thrombosis) ([place]) 07/11/2011  . Abdominal pain, left lower quadrant 01/29/2011  . Long term current use of anticoagulant therapy 01/29/2011  . Diarrhea 01/29/2011     Past Surgical History:  Procedure Laterality Date  . APPENDECTOMY    . BACK SURGERY    . CHOLECYSTECTOMY  2008  . HERNIA REPAIR    . HIATAL HERNIA REPAIR    . IR ANGIO INTRA EXTRACRAN SEL INTERNAL CAROTID BILAT MOD SED  01/18/2020  . IR ANGIO VERTEBRAL SEL VERTEBRAL BILAT MOD SED  01/18/2020  . IR US GUIDE VASC ACCESS RIGHT  01/18/2020  . LEFT HEART CATHETERIZATION WITH CORONARY ANGIOGRAM N/A 08/02/2014   Procedure: LEFT HEART CATHETERIZATION WITH CORONARY ANGIOGRAM;  Surgeon: Laverda Page, MD;  Location: [facility] CATH LAB;  Service: Cardiovascular;  Laterality: N/A;  . MULTIPLE EXTRACTIONS WITH ALVEOLOPLASTY N/A 10/19/2015   Procedure: Extraction of tooth #'s 2,12,13 with alveoloplasty;  Surgeon: Lenn Cal, DDS;  Location: [facility];  Service: Oral Surgery;  Laterality: N/A;  . SACRAL NERVE STIMULATOR PLACEMENT  2011       Family History  Problem Relation Age of Onset  . Prostate cancer Father   . Heart disease Father   . Diabetes Mother   . Heart disease Mother   . Colon cancer Maternal Uncle        dx in his 83's  . Pancreatic cancer Paternal Uncle   . Prostate cancer Paternal Uncle   . Multiple sclerosis Daughter   . Prostate cancer Paternal Uncle   . Esophageal cancer Neg Hx   . Rectal cancer Neg Hx   .  Stomach cancer Neg Hx     Social History   Tobacco Use  . Smoking status: Current Every Day Smoker    Packs/day: 0.50    Years: 40.00    Pack years: 20.00    Types: Cigarettes  . Smokeless tobacco: Never Used  Vaping Use  . Vaping Use: Never used  Substance Use Topics  . Alcohol use: Yes    Alcohol/week: 0.0 standard drinks    Comment: Occasional  . Drug use: Yes    Types: Marijuana    Comment: for nausea    Home Medications Prior to Admission medications   Medication Sig Start Date End Date Taking? Authorizing Provider  cephALEXin (KEFLEX) 500 MG capsule Take 1 capsule (500 mg total) by mouth 4 (four) times daily for 7 days. 02/13/21 02/20/21 Yes  Emily Forse, PA-C  oxyCODONE-acetaminophen (PERCOCET/ROXICET) 5-325 MG tablet Take 1 tablet by mouth every 6 (six) hours as needed for severe pain. 02/13/21  Yes Shareen Capwell, PA-C  albuterol (PROVENTIL HFA;VENTOLIN HFA) 108 (90 Base) MCG/ACT inhaler Inhale 2 puffs into the lungs every 6 (six) hours as needed for wheezing or shortness of breath. 01/27/17   Varney Biles, MD  amoxicillin-clavulanate (AUGMENTIN) 875-125 MG tablet Take 1 tablet by mouth 2 (two) times daily. 10/05/20   [provider]  cetirizine (ZYRTEC) 10 MG tablet Take 10 mg by mouth as needed. 02/05/18   [provider]  dicyclomine (BENTYL) 10 MG capsule Take 1 capsule (10 mg total) by mouth 3 (three) times daily as needed for spasms. 07/06/19   Doran Stabler, MD  doxycycline (VIBRAMYCIN) 100 MG capsule Take 1 capsule (100 mg total) by mouth 2 (two) times daily. 04/23/18   Couture, Cortni S, PA-C  DULoxetine (CYMBALTA) 60 MG capsule Take 120 mg by mouth daily.     [provider]  fluticasone (FLONASE) 50 MCG/ACT nasal spray Place 1 spray into both nostrils 2 (two) times daily. 01/25/18   [provider]  metoprolol tartrate (LOPRESSOR) 25 MG tablet Take 50 mg by mouth 2 (two) times daily.  07/29/14   [provider]  NIFEDICAL XL 60 MG 24 hr tablet Take 120 mg by mouth daily.  02/21/14   [provider]  pantoprazole (PROTONIX) 40 MG tablet Take 1 tablet (40 mg total) by mouth 2 (two) times daily. 10/11/20   Levin Erp, PA  rivaroxaban (XARELTO) 20 MG TABS tablet Take 1 tablet (20 mg total) by mouth daily with supper. Please follow up with Primary Care MD for future refills 10/19/19   Brunetta Genera, MD  traMADol (ULTRAM) 50 MG tablet TAKE 1 TO 2 TABLET BY MOUTH EVERY DAY AS NEEDED FOR PAIN 09/26/20   Aundra Dubin, PA-C  VOLTAREN 1 % GEL Apply 2 g topically daily as needed (pain). For neck and shoulder pain 02/23/14   [provider]   isosorbide mononitrate (IMDUR) 60 MG 24 hr tablet Take 60 mg by mouth daily.  11/28/11  [provider]    Allergies    Aspirin, Celecoxib, Ibuprofen, Lyrica [pregabalin], Nsaids, and Vicodin [hydrocodone-acetaminophen]  Review of Systems   Review of Systems  Gastrointestinal: Positive for abdominal pain and nausea.  Genitourinary: Positive for dysuria and flank pain.  All other systems reviewed and are negative.   Physical Exam Updated Vital Signs BP (!) 191/105   Pulse 82   Temp 98.6 F (37 C) (Oral)   Resp 18   Ht 6\' 4"  (1.93 m)  Wt 104 kg   SpO2 98%   BMI 27.91 kg/m   Physical Exam Vitals and nursing note reviewed.  Constitutional:      General: He is not in acute distress.    Appearance: He is well-developed.     Comments: Pt appears uncomfortable due to pain. Not in acute distress  HENT:     Head: Normocephalic and atraumatic.  Eyes:     Extraocular Movements: Extraocular movements intact.     Conjunctiva/sclera: Conjunctivae normal.     Pupils: Pupils are equal, round, and reactive to light.  Cardiovascular:     Rate and Rhythm: Normal rate and regular rhythm.     Pulses: Normal pulses.  Pulmonary:     Effort: Pulmonary effort is normal. No respiratory distress.     Breath sounds: Normal breath sounds. No wheezing.  Abdominal:     General: There is no distension.     Palpations: Abdomen is soft. There is no mass.     Tenderness: There is abdominal tenderness. There is right CVA tenderness. There is no guarding or rebound.     Comments: Tenderness palpation of right flank and right sided abdomen.  No rigidity or distention.  Musculoskeletal:        General: Normal range of motion.     Cervical back: Normal range of motion and neck supple.  Skin:    General: Skin is warm and dry.     Capillary Refill: Capillary refill takes less than 2 seconds.  Neurological:     Mental Status: He is alert and oriented to person, place, and time.     ED  Results / Procedures / Treatments   Labs (all labs ordered are listed, but only abnormal results are displayed) Labs Reviewed  CBC WITH DIFFERENTIAL/PLATELET - Abnormal; Notable for the following components:      Result Value   MCV 73.4 (*)    MCH 23.6 (*)    RDW 16.5 (*)    All other components within normal limits  COMPREHENSIVE METABOLIC PANEL - Abnormal; Notable for the following components:   Potassium 3.3 (*)    Glucose, Bld 103 (*)    Total Protein 6.2 (*)    Alkaline Phosphatase 37 (*)    All other components within normal limits  URINALYSIS, ROUTINE W REFLEX MICROSCOPIC - Abnormal; Notable for the following components:   Specific Gravity, Urine >1.046 (*)    All other components within normal limits  URINE CULTURE  LIPASE, BLOOD    EKG None  Radiology CT Angio Chest/Abd/Pel for Dissection W and/or Wo Contrast  Result Date: 02/13/2021 CLINICAL DATA:  Abdominal pain, aortic dissection suspected Patient reports right flank pain and burning with urination. Nausea. EXAM: CT ANGIOGRAPHY CHEST, ABDOMEN AND PELVIS TECHNIQUE: Non-contrast CT of the chest was initially obtained. Multidetector CT imaging through the chest, abdomen and pelvis was performed using the standard protocol during bolus administration of intravenous contrast. Multiplanar reconstructed images and MIPs were obtained and reviewed to evaluate the vascular anatomy. CONTRAST:  121mL OMNIPAQUE IOHEXOL 350 MG/ML SOLN COMPARISON:  Abdominopelvic CT 04/23/2018, additional priors reviewed FINDINGS: CTA CHEST FINDINGS Cardiovascular: Calcified plaque throughout the thoracic aorta without dissection, aneurysm, vasculitis or evidence of acute aortic abnormality. No hematoma on noncontrast exam. Mild descending aortic tortuosity. Conventional branching pattern from the aortic arch with patent branch vessels. The central pulmonary arteries are well opacified without pulmonary embolus to the segmental level. Upper normal heart  size. No pericardial effusion. Occasional coronary artery calcifications. Mediastinum/Nodes:  No enlarged mediastinal or hilar lymph nodes. No esophageal wall thickening. No visualized thyroid nodule. Lungs/Pleura: Mild emphysema. Bronchial thickening with mild heterogeneous pulmonary parenchyma. No acute or focal airspace disease. There is linear atelectasis or scarring in the lingula, right middle lobe, and right greater than left lower lobes. 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 No pleural fluid. No findings of pulmonary edema. Musculoskeletal: Spinal stimulator device in place with lead posterior to T9. Thoracic spondylosis. There are no acute or suspicious osseous abnormalities. Review of the MIP images confirms the above findings. CTA ABDOMEN AND PELVIS FINDINGS VASCULAR Aorta: Moderate calcified plaque. Normal in caliber without aneurysm, dissection, vasculitis, or significant stenosis. Celiac: Mild plaque at the origin but no evidence of significant stenosis. No aneurysm, dissection, or vasculitis. There is chronic edema and stranding adjacent to the celiac axis that is been stable dating back to 2014. SMA: Patent without evidence of aneurysm, dissection, vasculitis or significant stenosis. Renals: Single right renal artery which is patent. Accessory lower pole left renal artery arises from the distal aorta. Both left renal arteries are patent. IMA: Patent, slightly small in caliber. Inflow: Moderate atherosclerosis. No aneurysm, dissection, vasculitis, or significant stenosis. Veins: Venous filter in the right common iliac vein just proximal to the IVC junction. No obvious venous abnormality on this arterial phase exam. Review of the MIP images confirms the above findings. NON-VASCULAR Hepatobiliary: Normal appearance of the liver on this arterial phase exam. Cholecystectomy without biliary dilatation. Pancreas: No ductal dilatation or inflammation. Spleen: Normal in size and arterial  enhancement. Adrenals/Urinary Tract: Thickening of the left adrenal gland without dominant nodule. Normal right adrenal gland. There is no hydronephrosis or perinephric edema. No evidence of focal renal lesion on this arterial phase exam. Minimal cortical scarring in the lower right kidney. Minimally distended urinary bladder. No bladder wall thickening. Stomach/Bowel: Stomach is within normal limits. No evidence of bowel wall thickening, distention, or inflammatory changes. Appendix not visualized, appendectomy per history. Mild descending and sigmoid colonic diverticulosis without diverticulitis. Lymphatic: No enlarged lymph nodes in the abdomen or pelvis. Reproductive: Prostate is unremarkable. Other: No ascites or free air. Multiple phleboliths in the right spermatic cord. Left inguinal hernia repair. Soft tissue density in the anterior abdominal wall scalp cutaneous tissues is typical of medication injection site. Musculoskeletal: Spinal stimulator in place with battery pack on the right. Lumbar spondylosis with degenerative disc disease and facet hypertrophy. There are no acute or suspicious osseous abnormalities. Review of the MIP images confirms the above findings. IMPRESSION: 1. Thoracoabdominal aortic atherosclerosis without dissection or acute findings. 2. No acute abnormality in the chest, abdomen or pelvis. 3. Emphysema with mild bronchial thickening. 4. Colonic diverticulosis without diverticulitis. Aortic Atherosclerosis (ICD10-I70.0) and Emphysema (ICD10-J43.9). Electronically Signed   By: Keith Rake M.D.   On: 02/13/2021 17:02    Procedures Procedures   Medications Ordered in ED Medications  ondansetron (ZOFRAN) injection 4 mg (4 mg Intravenous Given 02/13/21 1556)  fentaNYL (SUBLIMAZE) injection 50 mcg (50 mcg Intravenous Given 02/13/21 1556)  iohexol (OMNIPAQUE) 350 MG/ML injection 100 mL (100 mLs Intravenous Contrast Given 02/13/21 1634)  oxyCODONE-acetaminophen (PERCOCET/ROXICET)  5-325 MG per tablet 1 tablet (1 tablet Oral Given 02/13/21 1729)    ED Course  I have reviewed the triage vital signs and the nursing notes.  Pertinent labs & imaging results that were available during my care of the patient were reviewed by me and considered in my medical decision making (see chart for details).    MDM  Rules/Calculators/A&P                          Patient presented for evaluation of right-sided flank pain and urinary symptoms.  On exam, patient appears uncomfortable due to pain, but not in acute distress.  This is going on for about a week.  Most likely UTI versus kidney stone versus [place].  However as patient does report pain goes into his right shoulder and his right leg, consider dissection, though less likely.  Labs obtained from triage interpreted by me, overall reassuring.  No leukocytosis.  Kidney function is normal.  Urine is pending.  Developed dissection will obtain CTA.  CTA negative for acute findings. UA negative for infection, cx sent in the setting of symtpoms. As pt had contrast with the scan, consider kidney stone that was not visualized. Will tx for uti and have pt f/u closely with PCP.  Discussed findings and plan with patient, who is agreeable.  At this time, patient appears safe for discharge.  Return precautions given.  Patient states he understands and agrees to plan.   Final Clinical Impression(s) / ED Diagnoses Final diagnoses:  Right flank pain  Dysuria    Rx / DC Orders ED Discharge Orders         Ordered    cephALEXin (KEFLEX) 500 MG capsule  4 times daily        02/13/21 1913    oxyCODONE-acetaminophen (PERCOCET/ROXICET) 5-325 MG tablet  Every 6 hours PRN        02/13/21 1913           Franchot Heidelberg, PA-C 02/13/21 2128    Lucrezia Starch, MD 02/15/21 1801

## 2021-02-13 NOTE — ED Triage Notes (Signed)
Pt reports R flank pain, burning with urination, nausea without vomiting x 1 week. Seen for same by PCP, given muscle relaxer and steroids without relief.

## 2021-02-13 NOTE — ED Provider Notes (Signed)
Emergency Medicine Provider Triage Evaluation Note  [name] [name] , a 70 y.o. male  was evaluated in triage.  Pt complains of right back and right flank pain x 1 week. Constant, worse with movement. No history of kidney stones. No trauma to the back. Dysuria without hematuria. No fevers. No saddle anesthesia, no urinary incontinence   Review of Systems  Positive: Back pain, right flank pain, dysuria, nausea  Negative: Vomiting, bloody stool, weakness   Physical Exam  BP (!) 165/93 (BP Location: Right Arm)   Pulse 78   Temp 98.6 F (37 C) (Oral)   Resp 20   SpO2 98%  Gen:   Awake, no distress   Resp:  Normal effort  MSK:   Moves extremities without difficulty  Other:  + right cvat. No bony tenderness, diffuse abdominal tenderness   Medical Decision Making  Medically screening exam initiated at 9:50 AM.  Appropriate orders placed.  Adria Devon was informed that the remainder of the evaluation will be completed by another provider, this initial triage assessment does not replace that evaluation, and the importance of remaining in the ED until their evaluation is complete.  Labs ordered.    Sherrill Raring, PA-C 02/13/21 9924    Charlesetta Shanks, MD 02/20/21 2125

## 2021-02-13 NOTE — Discharge Instructions (Addendum)
Take antibiotics as prescribed. Take the entire course, even if symptoms improve.  Use oxycodone as needed for severe or breakthrough pain. Have caution, this may make you tired or groggy.  Follow up with your primary care doctor for recheck of your symptoms. Return to the ER if you develop any new, worsening, or concerning symptoms.

## 2021-02-13 NOTE — ED Notes (Signed)
Patient is outside

## 2021-02-15 LAB — URINE CULTURE: Culture: <10000 — AB

## 2021-03-02 ENCOUNTER — Telehealth: Payer: Self-pay | Admitting: Plastic Surgery

## 2021-03-02 NOTE — Telephone Encounter (Signed)
Patient called to report soreness where a lipoma was removed in April 2022. Please call to advise [phone]. Thank you.

## 2021-03-02 NOTE — Telephone Encounter (Signed)
Called and [facility] @ 11:46am.//AB/CMA

## 2021-03-05 ENCOUNTER — Telehealth: Payer: Self-pay | Admitting: Plastic Surgery

## 2021-03-05 NOTE — Telephone Encounter (Signed)
Called and spoke with the patient's wife.  She stated that the patient is having swelling and tenderness on the right side were a lipoma was removed on (01/04/21).   Asked the wife if the patient has tried Tylenol for the pain.  She stated that he tried the Tylenol, but it does not help.  She also stated that he has been doing warm and cold heat.  Asked if the patient could take Ibuprofen, and she stated no he could not take Ibuprofen.      Informed the patient's wife that I will speak with Dr. Claudia Desanctis to see what he says.  The wife verbalized understanding and agreed.//AB/CMA

## 2021-03-07 ENCOUNTER — Other Ambulatory Visit: Payer: Self-pay | Admitting: Orthopaedic Surgery

## 2021-03-07 DIAGNOSIS — M4316 Spondylolisthesis, lumbar region: Secondary | ICD-10-CM

## 2021-03-08 ENCOUNTER — Encounter: Payer: Self-pay | Admitting: Orthopaedic Surgery

## 2021-03-08 ENCOUNTER — Ambulatory Visit (INDEPENDENT_AMBULATORY_CARE_PROVIDER_SITE_OTHER): Payer: Medicare Other | Admitting: Orthopaedic Surgery

## 2021-03-08 ENCOUNTER — Ambulatory Visit: Payer: Self-pay

## 2021-03-08 VITALS — Ht 75.0 in | Wt 232.0 lb

## 2021-03-08 DIAGNOSIS — G8929 Other chronic pain: Secondary | ICD-10-CM | POA: Diagnosis not present

## 2021-03-08 DIAGNOSIS — M25511 Pain in right shoulder: Secondary | ICD-10-CM

## 2021-03-08 MED ORDER — OXYCODONE-ACETAMINOPHEN 5-325 MG PO TABS: 1.0000 | ORAL_TABLET | Freq: Every day | ORAL | 0 refills | Status: DC | PRN

## 2021-03-08 NOTE — Progress Notes (Signed)
Office Visit Note   Patient: [name] [name]           Date of Birth: 23-Mar-1951           MRN: [id number] Visit Date: 03/08/2021              Requested by: No referring provider defined for this encounter. PCP: Pcp, No   Assessment & Plan: Visit Diagnoses:  1. Chronic right shoulder pain     Plan: Impression is right shoulder rotator cuff arthropathy and degenerative arthritis.  Based on treatment options patient is very interested in a glenohumeral injection.  I gave him a small supply of Percocet for severe pain.  He will follow-up with [place] as needed.  Follow-Up Instructions: Return if symptoms worsen or fail to improve.   Orders:  Orders Placed This Encounter  Procedures   XR Shoulder Right   Meds ordered this encounter  Medications   oxyCODONE-acetaminophen (PERCOCET) 5-325 MG tablet    Sig: Take 1 tablet by mouth daily as needed for severe pain.    Dispense:  10 tablet    Refill:  0      Procedures: No procedures performed   Clinical Data: No additional findings.   Subjective: Chief Complaint  Patient presents with   Right Shoulder - Pain    [name] [name] is a 70 year old gentleman who have seen in the past for other problems who comes in for severe right shoulder pain.  Denies any injuries.  He works as a Production designer, theatre/television/film.  He is left-handed and he states that he has had a lot of trouble and pain with reaching above his head.  He has pain throughout the shoulder that radiates into the shoulder blade.  Denies any radiculopathy.  He feels a lot of grinding with shoulder range of motion.  He has remote history of rotator cuff repair.   Review of Systems  Constitutional: Negative.   All other systems reviewed and are negative.   Objective: Vital Signs: Ht 6\' 3"  (1.905 m)   Wt 232 lb (105.2 kg)   BMI 29.00 kg/m   Physical Exam Vitals and nursing note reviewed.  Constitutional:      Appearance: He is well-developed.  Pulmonary:     Effort: Pulmonary  effort is normal.  Abdominal:     Palpations: Abdomen is soft.  Skin:    General: Skin is warm.  Neurological:     Mental Status: He is alert and oriented to person, place, and time.  Psychiatric:        Behavior: Behavior normal.        Thought Content: Thought content normal.        Judgment: Judgment normal.    Ortho Exam Right shoulder reveals significant crepitus with range of motion of the glenohumeral joint.  Manual muscle testing of the rotator cuff is mildly weak secondary to pain.  He has decreased strength with reaching above his head with slight shrugging. Specialty Comments:  No specialty comments available.  Imaging: XR Shoulder Right  Result Date: 03/08/2021 X-rays demonstrate 3 anchors within the humeral head from prior rotator cuff repair.  Acromiohumeral distance is decreased consistent with rotator cuff arthropathy.    PMFS History: Patient Active Problem List   Diagnosis Date Noted   Primary osteoarthritis of left knee 03/29/2020   Primary osteoarthritis of right knee 07/08/2017   Complete rotator cuff tear of left shoulder 10/21/2016   Gout attack 04/02/2016   Angina pectoris ([place]) 08/01/2014  Hemorrhage of rectum and anus 03/28/2014   Neuropathy, peripheral 05/15/2012   Pulmonary embolism ([place]) 07/11/2011   DVT (deep venous thrombosis) ([place]) 07/11/2011   Abdominal pain, left lower quadrant 01/29/2011   Long term current use of anticoagulant therapy 01/29/2011   Diarrhea 01/29/2011   Past Medical History:  Diagnosis Date   Anxiety and depression    Back pain    Diverticulitis    Diverticulosis    DVT (deep venous thrombosis) ([facility])    Gout    Hernia    History of blood clots    Hypertension    Nausea and vomiting    Neuropathy, peripheral 05/15/2012   patient denies   Shingles     Family History  Problem Relation Age of Onset   Prostate cancer Father    Heart disease Father    Diabetes Mother    Heart disease Mother    Colon cancer  Maternal Uncle        dx in his 69's   Pancreatic cancer Paternal Uncle    Prostate cancer Paternal Uncle    Multiple sclerosis Daughter    Prostate cancer Paternal Uncle    Esophageal cancer Neg Hx    Rectal cancer Neg Hx    Stomach cancer Neg Hx     Past Surgical History:  Procedure Laterality Date   APPENDECTOMY     BACK SURGERY     CHOLECYSTECTOMY  2008   HERNIA REPAIR     HIATAL HERNIA REPAIR     IR ANGIO INTRA EXTRACRAN SEL INTERNAL CAROTID BILAT MOD SED  01/18/2020   IR ANGIO VERTEBRAL SEL VERTEBRAL BILAT MOD SED  01/18/2020   IR US GUIDE VASC ACCESS RIGHT  01/18/2020   LEFT HEART CATHETERIZATION WITH CORONARY ANGIOGRAM N/A 08/02/2014   Procedure: LEFT HEART CATHETERIZATION WITH CORONARY ANGIOGRAM;  Surgeon: Laverda Page, MD;  Location: [facility] CATH LAB;  Service: Cardiovascular;  Laterality: N/A;   MULTIPLE EXTRACTIONS WITH ALVEOLOPLASTY N/A 10/19/2015   Procedure: Extraction of tooth #'s 2,12,13 with alveoloplasty;  Surgeon: Lenn Cal, DDS;  Location: [place];  Service: Oral Surgery;  Laterality: N/A;   SACRAL NERVE STIMULATOR PLACEMENT  2011   Social History   Occupational History   Occupation: unemployed    Fish farm manager: OTHER    Employer: RETIRED  Tobacco Use   Smoking status: Every Day    Packs/day: 0.50    Years: 40.00    Pack years: 20.00    Types: Cigarettes   Smokeless tobacco: Never  Vaping Use   Vaping Use: Never used  Substance and Sexual Activity   Alcohol use: Yes    Alcohol/week: 0.0 standard drinks    Comment: Occasional   Drug use: Yes    Types: Marijuana    Comment: for nausea   Sexual activity: Not on file

## 2021-03-09 NOTE — Telephone Encounter (Signed)
Spoke with the patient on (03/06/21) regarding the message below.  Informed the patient that I spoke with Dr. Claudia Desanctis and he state that the pain should not be coming from where he removed the lipoma.  It has been 2 months since they were removed.    Patient verbalized understanding and agreed.    Informed the patient that he may need to reach out to his Primary Care provider and let them know about the pain he's having.  Patient stated that he did and they treated him for his side pain and checked his urine.  They placed him on an antibiotic in case he may had a UTI.    He stated that he's still having pain.    Informed the patient that he may want to give his Primary Care provider a call back and let them know that he is still having the pain.  Patient verbalized understanding and agreed.//AB/CMA

## 2021-03-14 ENCOUNTER — Encounter: Payer: Self-pay | Admitting: Family Medicine

## 2021-03-14 ENCOUNTER — Ambulatory Visit (INDEPENDENT_AMBULATORY_CARE_PROVIDER_SITE_OTHER): Payer: Medicare Other | Admitting: Family Medicine

## 2021-03-14 ENCOUNTER — Ambulatory Visit: Payer: Self-pay

## 2021-03-14 ENCOUNTER — Other Ambulatory Visit: Payer: Self-pay

## 2021-03-14 DIAGNOSIS — G8929 Other chronic pain: Secondary | ICD-10-CM | POA: Diagnosis not present

## 2021-03-14 DIAGNOSIS — M25511 Pain in right shoulder: Secondary | ICD-10-CM

## 2021-03-14 NOTE — Progress Notes (Signed)
Subjective: Patient is here for ultrasound-guided intra-articular right glenohumeral injection.    Objective:  Pain with overhead and behind the back reach.  Procedure: Ultrasound guided injection is preferred based studies that show increased duration, increased effect, greater accuracy, decreased procedural pain, increased response rate, and decreased cost with ultrasound guided versus blind injection.   Verbal informed consent obtained.  Time-out conducted.  Noted no overlying erythema, induration, or other signs of local infection. Ultrasound-guided right glenohumeral injection: After sterile prep with Betadine, injected 4 cc 0.25% bupivocaine without epinephrine and 6 mg betamethasone using a 22-gauge spinal needle, passing the needle from posterior approach into the glenohumeral joint.  Injectate seen filling joint capsule.

## 2021-03-28 ENCOUNTER — Ambulatory Visit
Admission: RE | Admit: 2021-03-28 | Discharge: 2021-03-28 | Disposition: A | Discharge status: Home / Self Care | Payer: Medicare Other | Source: Ambulatory Visit | Attending: Orthopaedic Surgery | Admitting: Orthopaedic Surgery

## 2021-03-28 ENCOUNTER — Other Ambulatory Visit: Payer: Self-pay

## 2021-03-28 DIAGNOSIS — M4316 Spondylolisthesis, lumbar region: Secondary | ICD-10-CM

## 2021-03-29 ENCOUNTER — Other Ambulatory Visit: Payer: Medicare Other

## 2021-04-25 ENCOUNTER — Other Ambulatory Visit: Payer: Self-pay

## 2021-04-25 ENCOUNTER — Emergency Department (HOSPITAL_COMMUNITY)
Admission: EM | Admit: 2021-04-25 | Discharge: 2021-04-26 | Disposition: A | Discharge status: Home / Self Care | Payer: Medicare Other | Attending: Emergency Medicine | Admitting: Emergency Medicine

## 2021-04-25 ENCOUNTER — Encounter (HOSPITAL_COMMUNITY): Payer: Self-pay | Admitting: Emergency Medicine

## 2021-04-25 DIAGNOSIS — R1031 Right lower quadrant pain: Secondary | ICD-10-CM | POA: Insufficient documentation

## 2021-04-25 DIAGNOSIS — I10 Essential (primary) hypertension: Secondary | ICD-10-CM | POA: Diagnosis not present

## 2021-04-25 DIAGNOSIS — R109 Unspecified abdominal pain: Secondary | ICD-10-CM | POA: Diagnosis present

## 2021-04-25 DIAGNOSIS — F17210 Nicotine dependence, cigarettes, uncomplicated: Secondary | ICD-10-CM | POA: Insufficient documentation

## 2021-04-25 DIAGNOSIS — Z79899 Other long term (current) drug therapy: Secondary | ICD-10-CM | POA: Insufficient documentation

## 2021-04-25 LAB — URINALYSIS, ROUTINE W REFLEX MICROSCOPIC
Bilirubin Urine: NEGATIVE
Glucose, UA: NEGATIVE mg/dL
Hgb urine dipstick: NEGATIVE
Ketones, ur: NEGATIVE mg/dL
Leukocytes,Ua: NEGATIVE
Nitrite: NEGATIVE
Protein, ur: NEGATIVE mg/dL
Specific Gravity, Urine: 1.015 (ref 1.005–1.030)
pH: 5 (ref 5.0–8.0)

## 2021-04-25 LAB — CBC WITH DIFFERENTIAL/PLATELET
Abs Immature Granulocytes: 0 10*3/uL (ref 0.00–0.07)
Basophils Absolute: 0 10*3/uL (ref 0.0–0.1)
Basophils Relative: 1 %
Eosinophils Absolute: 0.1 10*3/uL (ref 0.0–0.5)
Eosinophils Relative: 2 %
HCT: 37.6 % — ABNORMAL LOW (ref 39.0–52.0)
Hemoglobin: 12 g/dL — ABNORMAL LOW (ref 13.0–17.0)
Immature Granulocytes: 0 %
Lymphocytes Relative: 37 %
Lymphs Abs: 1.9 10*3/uL (ref 0.7–4.0)
MCH: 23.4 pg — ABNORMAL LOW (ref 26.0–34.0)
MCHC: 31.9 g/dL (ref 30.0–36.0)
MCV: 73.4 fL — ABNORMAL LOW (ref 80.0–100.0)
Monocytes Absolute: 0.5 10*3/uL (ref 0.1–1.0)
Monocytes Relative: 11 %
Neutro Abs: 2.6 10*3/uL (ref 1.7–7.7)
Neutrophils Relative %: 49 %
Platelets: 256 10*3/uL (ref 150–400)
RBC: 5.12 MIL/uL (ref 4.22–5.81)
RDW: 15.9 % — ABNORMAL HIGH (ref 11.5–15.5)
WBC: 5.1 10*3/uL (ref 4.0–10.5)
nRBC: 0 % (ref 0.0–0.2)

## 2021-04-25 LAB — COMPREHENSIVE METABOLIC PANEL
ALT: 16 U/L (ref 0–44)
AST: 22 U/L (ref 15–41)
Albumin: 3.8 g/dL (ref 3.5–5.0)
Alkaline Phosphatase: 46 U/L (ref 38–126)
Anion gap: 7 (ref 5–15)
BUN: 8 mg/dL (ref 8–23)
CO2: 28 mmol/L (ref 22–32)
Calcium: 9.4 mg/dL (ref 8.9–10.3)
Chloride: 103 mmol/L (ref 98–111)
Creatinine, Ser: 1.21 mg/dL (ref 0.61–1.24)
GFR, Estimated: >60 mL/min (ref >60)
Glucose, Bld: 92 mg/dL (ref 70–99)
Potassium: 3.3 mmol/L — ABNORMAL LOW (ref 3.5–5.1)
Sodium: 138 mmol/L (ref 135–145)
Total Bilirubin: 0.7 mg/dL (ref 0.3–1.2)
Total Protein: 6.4 g/dL — ABNORMAL LOW (ref 6.5–8.1)

## 2021-04-25 LAB — LIPASE, BLOOD: Lipase: 86 U/L — ABNORMAL HIGH (ref 11–51)

## 2021-04-25 NOTE — ED Provider Notes (Signed)
Emergency Medicine Provider Triage Evaluation Note  [name] [name] , a 70 y.o. male  was evaluated in triage.  Pt complains of right-sided flank pain radiating to groin.  Symptoms got worse today.  Minimal improvement noted with Tylenol.  No vomiting but reports nausea. No hx kidney stones  Review of Systems  Positive: Flank pain, nausea Negative: Vomiting  Physical Exam  BP (!) 164/88 (BP Location: Right Arm)   Pulse 89   Temp 98.2 F (36.8 C)   Resp 20   Ht '6\' 4"'$  (1.93 m)   Wt 104.3 kg   SpO2 99%   BMI 28.00 kg/m  Gen:   Awake, no distress   Resp:  Normal effort  MSK:   Moves extremities without difficulty  Other:  Right-sided middle abdominal tenderness  Medical Decision Making  Medically screening exam initiated at 9:11 PM.  Appropriate orders placed.  [name] [name] was informed that the remainder of the evaluation will be completed by another provider, this initial triage assessment does not replace that evaluation, and the importance of remaining in the ED until their evaluation is complete.  Lab work ordered   [name] [name], [name] [name] 04/25/21 2112    [name] Bo, MD 04/25/21 [phone]

## 2021-04-25 NOTE — ED Triage Notes (Signed)
Pt reports right sided abdominal pain for 4-5 months however today it is much worse.  Pt has tried OTC medications however has had no relief.

## 2021-04-26 ENCOUNTER — Emergency Department (HOSPITAL_COMMUNITY): Payer: Medicare Other

## 2021-04-26 DIAGNOSIS — R1031 Right lower quadrant pain: Secondary | ICD-10-CM | POA: Diagnosis not present

## 2021-04-26 LAB — PROTIME-INR
INR: 1.1 (ref 0.8–1.2)
Prothrombin Time: 14 seconds (ref 11.4–15.2)

## 2021-04-26 LAB — POC OCCULT BLOOD, ED: Fecal Occult Bld: NEGATIVE

## 2021-04-26 MED ORDER — LEVOFLOXACIN 500 MG PO TABS: 500.0000 mg | ORAL_TABLET | Freq: Every day | ORAL | 0 refills | Status: AC

## 2021-04-26 MED ORDER — IOHEXOL 350 MG/ML SOLN
80.0000 mL | Freq: Once | INTRAVENOUS | Status: AC | PRN
  Administered 2021-04-26: 80 mL via INTRAVENOUS

## 2021-04-26 MED ORDER — POTASSIUM CHLORIDE 20 MEQ PO PACK
40.0000 meq | PACK | Freq: Two times a day (BID) | ORAL | Status: DC
  Administered 2021-04-26: 40 meq via ORAL
  Filled 2021-04-26: qty 2

## 2021-04-26 MED ORDER — METHOCARBAMOL 500 MG PO TABS
500.0000 mg | ORAL_TABLET | Freq: Once | ORAL | Status: AC
  Administered 2021-04-26: 500 mg via ORAL
  Filled 2021-04-26: qty 1

## 2021-04-26 MED ORDER — MAGNESIUM OXIDE -MG SUPPLEMENT 400 (240 MG) MG PO TABS
800.0000 mg | ORAL_TABLET | Freq: Once | ORAL | Status: AC
  Administered 2021-04-26: 800 mg via ORAL
  Filled 2021-04-26: qty 2

## 2021-04-26 MED ORDER — LIDOCAINE 5 % EX PTCH
1.0000 | MEDICATED_PATCH | CUTANEOUS | Status: DC
  Administered 2021-04-26: 1 via TRANSDERMAL
  Filled 2021-04-26: qty 1

## 2021-04-26 NOTE — Discharge Instructions (Addendum)
Follow-up with your GI doctor regarding your recent dark stools.  Today, no blood was seen on testing of your stool, however, you have had a slight drop in your hemoglobin since her last visit.  Talk to your GI doctor about this.  There is a prescription for antibiotics attached to treat chronic epididymitis.  Take as prescribed.

## 2021-04-26 NOTE — ED Notes (Signed)
Pt repositioned, arm blankets applied. Guest service is transporting visitor to cafeteria. [place] train [place] and they came straight to ED. She has not had anything to eat since yesterday. Visitor declined a [place] sandwich. Water given per her request

## 2021-04-26 NOTE — ED Provider Notes (Signed)
[facility] EMERGENCY DEPARTMENT Provider Note   CSN: [id number] Arrival date & time: 04/25/21  2101     History Chief Complaint  Patient presents with   Abdominal Pain    [name] [name] is a 70 y.o. male.   Abdominal Pain Associated symptoms: dysuria   Associated symptoms: no chest pain, no chills, no cough, no diarrhea, no fatigue, no fever, no hematuria, no nausea, no shortness of breath, no sore throat and no vomiting   Patient presents for chronic right-sided flank pain.  Pain has been present for several months, however recently worsened.  He describes the pain as located in the right lower quadrant, radiating into his right testicle.  Pain is worsened with palpation and moving.  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  He has been seen in the ED before for same symptoms 2 months ago.  Work-up was reassuring at that time.  Patient states that he has had epididymal infections going back 50 years.  He states that they would recur approximately every 10 years, during which time they were treated with antibiotics.  He denies any high risk sexual encounters recently.  He states that the pain he experiences in his right testicle and groin is similar to previous episodes of epididymitis.  He has had some dysuria that he describes as located in his prostatic area.  He has had discomfort with defecation.  Patient states that he is on Coumadin and does endorse some dark stools recently (he is actually on Xarelto).  Attempted analgesia at home have been with Tylenol and oxycodone.  Patient avoids NSAIDs due to sensitive stomach and use of blood thinner.  Additional history notable for appendectomy.    Past Medical History:  Diagnosis Date   Anxiety and depression    Back pain    Diverticulitis    Diverticulosis    DVT (deep venous thrombosis) (HCC)    Gout    Hernia    History of blood clots     Hypertension    Nausea and vomiting    Neuropathy, peripheral 05/15/2012   patient denies   Shingles     Patient Active Problem List   Diagnosis Date Noted   Primary osteoarthritis of left knee 03/29/2020   Primary osteoarthritis of right knee 07/08/2017   Complete rotator cuff tear of left shoulder 10/21/2016   Gout attack 04/02/2016   Angina pectoris ([place]) 08/01/2014   Hemorrhage of rectum and anus 03/28/2014   Neuropathy, peripheral 05/15/2012   Pulmonary embolism ([place]) 07/11/2011   DVT (deep venous thrombosis) ([place]) 07/11/2011   Abdominal pain, left lower quadrant 01/29/2011   Long term current use of anticoagulant therapy 01/29/2011   Diarrhea 01/29/2011    Past Surgical History:  Procedure Laterality Date   APPENDECTOMY     BACK SURGERY     CHOLECYSTECTOMY  2008   HERNIA REPAIR     HIATAL HERNIA REPAIR     IR ANGIO INTRA EXTRACRAN SEL INTERNAL CAROTID BILAT MOD SED  01/18/2020   IR ANGIO VERTEBRAL SEL VERTEBRAL BILAT MOD SED  01/18/2020   IR US GUIDE VASC ACCESS RIGHT  01/18/2020   LEFT HEART CATHETERIZATION WITH CORONARY ANGIOGRAM N/A 08/02/2014   Procedure: LEFT HEART CATHETERIZATION WITH CORONARY ANGIOGRAM;  Surgeon: Laverda Page, MD;  Location: [facility] CATH LAB;  Service: Cardiovascular;  Laterality: N/A;   MULTIPLE EXTRACTIONS WITH ALVEOLOPLASTY N/A 10/19/2015  Procedure: Extraction of tooth #'s 2,12,13 with alveoloplasty;  Surgeon: Lenn Cal, DDS;  Location: [place];  Service: Oral Surgery;  Laterality: N/A;   SACRAL NERVE STIMULATOR PLACEMENT  2011       Family History  Problem Relation Age of Onset   Prostate cancer Father    Heart disease Father    Diabetes Mother    Heart disease Mother    Colon cancer Maternal Uncle        dx in his 67's   Pancreatic cancer Paternal Uncle    Prostate cancer Paternal Uncle    Multiple sclerosis Daughter    Prostate cancer Paternal Uncle    Esophageal cancer Neg Hx    Rectal cancer Neg Hx    Stomach cancer Neg Hx      Social History   Tobacco Use   Smoking status: Every Day    Packs/day: 0.50    Years: 40.00    Pack years: 20.00    Types: Cigarettes   Smokeless tobacco: Never  Vaping Use   Vaping Use: Never used  Substance Use Topics   Alcohol use: Yes    Alcohol/week: 0.0 standard drinks    Comment: Occasional   Drug use: Yes    Types: Marijuana    Comment: for nausea    Home Medications Prior to Admission medications   Medication Sig Start Date End Date Taking? Authorizing Provider  levofloxacin (LEVAQUIN) 500 MG tablet Take 1 tablet (500 mg total) by mouth daily for 10 days. 04/26/21 05/06/21 Yes Godfrey Pick, MD  albuterol (PROVENTIL HFA;VENTOLIN HFA) 108 (90 Base) MCG/ACT inhaler Inhale 2 puffs into the lungs every 6 (six) hours as needed for wheezing or shortness of breath. 01/27/17   Varney Biles, MD  amoxicillin-clavulanate (AUGMENTIN) 875-125 MG tablet Take 1 tablet by mouth 2 (two) times daily. 10/05/20   [provider]  cetirizine (ZYRTEC) 10 MG tablet Take 10 mg by mouth as needed. 02/05/18   [provider]  dicyclomine (BENTYL) 10 MG capsule Take 1 capsule (10 mg total) by mouth 3 (three) times daily as needed for spasms. 07/06/19   Doran Stabler, MD  doxycycline (VIBRAMYCIN) 100 MG capsule Take 1 capsule (100 mg total) by mouth 2 (two) times daily. 04/23/18   Couture, Cortni S, PA-C  DULoxetine (CYMBALTA) 60 MG capsule Take 120 mg by mouth daily.     [provider]  fluticasone (FLONASE) 50 MCG/ACT nasal spray Place 1 spray into both nostrils 2 (two) times daily. 01/25/18   [provider]  metoprolol tartrate (LOPRESSOR) 25 MG tablet Take 50 mg by mouth 2 (two) times daily.  07/29/14   [provider]  NIFEDICAL XL 60 MG 24 hr tablet Take 120 mg by mouth daily.  02/21/14   [provider]  oxyCODONE-acetaminophen (PERCOCET) 5-325 MG tablet Take 1 tablet by mouth daily as needed for severe pain. 03/08/21   Leandrew Koyanagi,  MD  oxyCODONE-acetaminophen (PERCOCET/ROXICET) 5-325 MG tablet Take 1 tablet by mouth every 6 (six) hours as needed for severe pain. 02/13/21   Caccavale, Sophia, PA-C  pantoprazole (PROTONIX) 40 MG tablet Take 1 tablet (40 mg total) by mouth 2 (two) times daily. 10/11/20   Levin Erp, PA  rivaroxaban (XARELTO) 20 MG TABS tablet Take 1 tablet (20 mg total) by mouth daily with supper. Please follow up with Primary Care MD for future refills 10/19/19   Brunetta Genera, MD  traMADol (ULTRAM) 50 MG tablet TAKE  1 TO 2 TABLET BY MOUTH EVERY DAY AS NEEDED FOR PAIN 09/26/20   Aundra Dubin, PA-C  VOLTAREN 1 % GEL Apply 2 g topically daily as needed (pain). For neck and shoulder pain 02/23/14   [provider]  isosorbide mononitrate (IMDUR) 60 MG 24 hr tablet Take 60 mg by mouth daily.  11/28/11  [provider]    Allergies    Aspirin, Celecoxib, Ibuprofen, Lyrica [pregabalin], Nsaids, and Vicodin [hydrocodone-acetaminophen]  Review of Systems   Review of Systems  Constitutional:  Negative for activity change, chills, fatigue and fever.  HENT:  Negative for ear pain and sore throat.   Eyes:  Negative for pain and visual disturbance.  Respiratory:  Negative for cough, chest tightness and shortness of breath.   Cardiovascular:  Negative for chest pain and palpitations.  Gastrointestinal:  Positive for abdominal pain (RLQ). Negative for abdominal distention, diarrhea, nausea and vomiting.       Dark stools  Genitourinary:  Positive for dysuria and testicular pain. Negative for genital sores, hematuria, penile swelling, scrotal swelling and urgency.  Musculoskeletal:  Negative for arthralgias, back pain, joint swelling, myalgias and neck pain.  Skin:  Negative for color change and rash.  Neurological:  Negative for dizziness, seizures, syncope, weakness, light-headedness, numbness and headaches.  Hematological:  Bruises/bleeds easily (On Xarelto).  All other systems  reviewed and are negative.  Physical Exam Updated Vital Signs BP (!) 160/143   Pulse 85   Temp 98.3 F (36.8 C) (Oral)   Resp 15   Ht '6\' 4"'$  (1.93 m)   Wt 104.3 kg   SpO2 100%   BMI 28.00 kg/m   Physical Exam Vitals and nursing note reviewed.  Constitutional:      General: He is not in acute distress.    Appearance: He is well-developed. He is not ill-appearing, toxic-appearing or diaphoretic.  HENT:     Head: Normocephalic and atraumatic.     Mouth/Throat:     Mouth: Mucous membranes are moist.     Pharynx: Oropharynx is clear.  Eyes:     Conjunctiva/sclera: Conjunctivae normal.  Cardiovascular:     Rate and Rhythm: Normal rate and regular rhythm.     Heart sounds: No murmur heard. Pulmonary:     Effort: Pulmonary effort is normal. No respiratory distress.     Breath sounds: Normal breath sounds.  Abdominal:     Palpations: Abdomen is soft. There is no mass.     Tenderness: There is abdominal tenderness in the right lower quadrant. There is no right CVA tenderness, left CVA tenderness or guarding.  Genitourinary:    Testes:        Right: Tenderness present. Mass or swelling not present.        Left: Mass, tenderness or swelling not present.     Rectum: Guaiac result negative.  Musculoskeletal:     Cervical back: Neck supple.  Skin:    General: Skin is warm and dry.  Neurological:     General: No focal deficit present.     Mental Status: He is alert and oriented to person, place, and time.     Cranial Nerves: No cranial nerve deficit.     Motor: No weakness.  Psychiatric:        Mood and Affect: Mood normal.        Behavior: Behavior normal.    ED Results / Procedures / Treatments   Labs (all labs ordered are listed, but only abnormal results are  displayed) Labs Reviewed  COMPREHENSIVE METABOLIC PANEL - Abnormal; Notable for the following components:      Result Value   Potassium 3.3 (*)    Total Protein 6.4 (*)    All other components within normal  limits  CBC WITH DIFFERENTIAL/PLATELET - Abnormal; Notable for the following components:   Hemoglobin 12.0 (*)    HCT 37.6 (*)    MCV 73.4 (*)    MCH 23.4 (*)    RDW 15.9 (*)    All other components within normal limits  LIPASE, BLOOD - Abnormal; Notable for the following components:   Lipase 86 (*)    All other components within normal limits  URINALYSIS, ROUTINE W REFLEX MICROSCOPIC  PROTIME-INR  POC OCCULT BLOOD, ED    EKG None  Radiology CT ABDOMEN PELVIS W CONTRAST  Result Date: 04/26/2021 CLINICAL DATA:  Right lower quadrant abdominal pain. EXAM: CT ABDOMEN AND PELVIS WITH CONTRAST TECHNIQUE: Multidetector CT imaging of the abdomen and pelvis was performed using the standard protocol following bolus administration of intravenous contrast. CONTRAST:  65m OMNIPAQUE IOHEXOL 350 MG/ML SOLN COMPARISON:  February 13, 2021 FINDINGS: Lower chest: No acute abnormality. Hepatobiliary: No focal liver abnormality is seen. Status post cholecystectomy. No biliary dilatation. Pancreas: A trace amount of inflammatory fat stranding is seen along the posterior aspect of the pancreatic body. This is stable in appearance when compared to the prior studies. No pancreatic ductal dilatation or surrounding inflammatory changes. Spleen: Normal in size without focal abnormality. Adrenals/Urinary Tract: Mild diffuse bilateral adrenal gland enlargement is seen. Kidneys are normal, without renal calculi, focal lesion, or hydronephrosis. Bladder is unremarkable. Stomach/Bowel: Stomach is within normal limits. The appendix is surgically absent. No evidence of bowel wall thickening, distention, or inflammatory changes. Noninflamed diverticula are seen throughout the sigmoid colon Vascular/Lymphatic: Aortic atherosclerosis. An inferior vena cava filter is present. No enlarged abdominal or pelvic lymph nodes. Reproductive: Prostate is unremarkable. Other: No abdominal wall hernia or abnormality. No abdominopelvic ascites.  Musculoskeletal: No acute or significant osseous findings. IMPRESSION: 1. No acute or active process within the abdomen or pelvis. 2. Evidence of prior cholecystectomy. 3. Sigmoid diverticulosis. 4. IVC filter in place. Electronically Signed   By: TVirgina NorfolkM.D.   On: 04/26/2021 04:27    Procedures Procedures   Medications Ordered in ED Medications  iohexol (OMNIPAQUE) 350 MG/ML injection 80 mL (80 mLs Intravenous Contrast Given 04/26/21 0414)  magnesium oxide (MAG-OX) tablet 800 mg (800 mg Oral Given 04/26/21 0956)  methocarbamol (ROBAXIN) tablet 500 mg (500 mg Oral Given 04/26/21 [id number]    ED Course  I have reviewed the triage vital signs and the nursing notes.  Pertinent labs & imaging results that were available during my care of the patient were reviewed by me and considered in my medical decision making (see chart for details).    MDM Rules/Calculators/A&P                           Patient is a 70year old male who presents for worsening of chronic right lower quadrant abdominal pain.  Pain also radiates into groin and right testicle.  On exam, he does endorse tenderness to the area of his right lower quadrant as well as right epididymal tenderness.  He states that he feels a knot in the right lower quadrant but I am unable to appreciate this on palpation.  Prior to being bedded in the ED, patient was able to undergo diagnostic work-up.  Results of CT scan, blood work, and urinalysis were all without any results to identify etiology.  Hemoglobin was: 12.0, a 1.6 g/dL drop since June.  Patient initially stated that he is on Coumadin.  Will check for melena/Hemoccult blood and will check INR.  INR found to be normal.  When questioned again, patient states that he takes his Xarelto.  I did perform a DRE which was negative for occult blood, but which also did not have any visible stool from rectum.  Patient does state that he has a follow-up appointment scheduled with gastroenterology.   He was advised to speak with them about his recent dark stools in the setting of blood thinner use.  Regarding his pain, etiology is uncertain at this time.  It could be chronic change/allodynia from his past appendectomy.  Lidocaine patch was applied.  Robaxin was ordered for empiric treatment of a muscular etiology.  Patient denies any recent constipation.  Given the also has testicular pain and tenderness, abdominal pain could be referred pain from a testicular etiology.  I spoke with the patient about treating what could be chronic epididymitis.  Patient was in favor of a course of antibiotics.  Levaquin was prescribed.  Patient to follow-up with his outpatient providers for further diagnostic studies and management.  Patient was discharged in good condition.  Final Clinical Impression(s) / ED Diagnoses Final diagnoses:  Right lower quadrant abdominal pain    Rx / DC Orders ED Discharge Orders          Ordered    levofloxacin (LEVAQUIN) 500 MG tablet  Daily        04/26/21 1104             Godfrey Pick, MD 04/26/21 1701

## 2021-04-30 ENCOUNTER — Telehealth: Payer: Self-pay | Admitting: Physician Assistant

## 2021-04-30 NOTE — Telephone Encounter (Signed)
Pt's fiance Giovenne called looking for sooner appt that 05/23/21. Pt was recently at the ED and was told that he needs to be seen sooner. She states that he is in a lot of pain. Pls call her at [phone]

## 2021-04-30 NOTE — Telephone Encounter (Signed)
Spoke with patient's fiance, Givonne. She states that patient has RLQ pain, pt reports that it feels like he has a grapefruit in his stomach. He does have some abdominal swelling. Pt has tried heating pad and back brace with no relief. Denies any knowledge of a hernia. Patient was prescribed Levofloxacin 500 mg daily at the time of discharge for a UTI.Pt reports that his last BM was today and he seemed to empty completely. Pt is requesting a sooner appt. Patient's appt has been moved to tomorrow, 04/30/21 at 10 am with Alonza Bogus, PA-C. Tomie China had no concerns at the end of the call.

## 2021-05-01 ENCOUNTER — Encounter: Payer: Self-pay | Admitting: Gastroenterology

## 2021-05-01 ENCOUNTER — Ambulatory Visit (INDEPENDENT_AMBULATORY_CARE_PROVIDER_SITE_OTHER): Payer: Medicare Other | Admitting: Gastroenterology

## 2021-05-01 VITALS — BP 130/70 | HR 70 | Ht 76.0 in | Wt 231.5 lb

## 2021-05-01 DIAGNOSIS — K529 Noninfective gastroenteritis and colitis, unspecified: Secondary | ICD-10-CM | POA: Diagnosis not present

## 2021-05-01 DIAGNOSIS — M549 Dorsalgia, unspecified: Secondary | ICD-10-CM

## 2021-05-01 DIAGNOSIS — R109 Unspecified abdominal pain: Secondary | ICD-10-CM

## 2021-05-01 DIAGNOSIS — G8929 Other chronic pain: Secondary | ICD-10-CM

## 2021-05-01 MED ORDER — CHOLESTYRAMINE 4 G PO PACK: 4.0000 g | PACK | Freq: Every day | ORAL | 3 refills | Status: DC

## 2021-05-01 NOTE — Patient Instructions (Addendum)
It was my pleasure to provide care to you today. Based on our discussion, I am providing you with my recommendations below:  RECOMMENDATION(S):   Please call back to schedule your colonoscopy when you are able Please ensure you follow up urgently with Dr. Towanda Malkin office  PRESCRIPTION MEDICATION(S):   We have sent the following medication(s) to your pharmacy:  Questran  NOTE: If your medication(s) requires a PRIOR AUTHORIZATION, we will receive notification from your pharmacy. Once received, the process to submit for approval may take up to 7-10 business days. You will be contacted about any denials we have received from your insurance company as well as alternatives recommended by your provider.  FOLLOW UP:  I would like for you to follow up with me as needed. Please call the office at [phone] to schedule your appointment.  BMI:  If you are age 30 or older, your body mass index should be between 23-30. Your There is no height or weight on file to calculate BMI. If this is out of the aforementioned range listed, please consider follow up with your Primary Care Provider.  MY CHART:  The [facility] providers would like to encourage you to use [facility] to communicate with providers for non-urgent requests or questions.  Due to long hold times on the telephone, sending your provider a message by [facility] may be a faster and more efficient way to get a response.  Please allow 48 business hours for a response.  Please remember that this is for non-urgent requests.   Thank you for trusting me with your gastrointestinal care!    Alonza Bogus PA-C

## 2021-05-01 NOTE — Progress Notes (Signed)
05/01/2021 ELIYOHU BESHARA [id number] [birth year]   HISTORY OF PRESENT ILLNESS: This is a 70 year old male who is a patient of Dr. Corena Pilgrim.  He is here today for evaluation of what is being called abdominal pain and diarrhea.  In regards to the diarrhea, that has been present for at least the past couple of years.  Colonoscopy October 2020 was negative for microscopic colitis.  He has dicyclomine that he uses.  He does not have a gallbladder.  More pressing and more acute, he has had this right-sided abdominal pain for the past few weeks.  He says that it comes around from his back into his right side and into the right side of his abdomen.  He also describes it radiating down into his groin and his leg.  He says that he cannot bend or barely move to either side because it hurts so bad, even taking deep breaths causes it to hurt.  CT scan of the abdomen and pelvis with contrast last week in the ER was unremarkable for any intra-abdominal source.  He has severe back issues.  He follows with Dr. Patrice Paradise.  He has an nerve/spinal stimulator in place and it apparently was recently manipulated or turned back on or something.   Past Medical History:  Diagnosis Date   Anxiety and depression    Back pain    Diverticulitis    Diverticulosis    DVT (deep venous thrombosis) (HCC)    Gout    Hernia    History of blood clots    Hypertension    Nausea and vomiting    Neuropathy, peripheral 05/15/2012   patient denies   Shingles    Past Surgical History:  Procedure Laterality Date   APPENDECTOMY     BACK SURGERY     CHOLECYSTECTOMY  2008   HERNIA REPAIR     HIATAL HERNIA REPAIR     IR ANGIO INTRA EXTRACRAN SEL INTERNAL CAROTID BILAT MOD SED  01/18/2020   IR ANGIO VERTEBRAL SEL VERTEBRAL BILAT MOD SED  01/18/2020   IR US GUIDE VASC ACCESS RIGHT  01/18/2020   LEFT HEART CATHETERIZATION WITH CORONARY ANGIOGRAM N/A 08/02/2014   Procedure: LEFT HEART CATHETERIZATION WITH CORONARY ANGIOGRAM;  Surgeon:  Laverda Page, MD;  Location[facility] C[facility] LAB;  Service: Cardiovascular;  Laterality: N/A;   MULTIPLE EXTRACTIONS WITH ALVEOLOPLASTY N/A 10/19/2015   Procedure: Extraction of tooth #'s 2,12,13 with alveoloplasty;  Surgeon: Lenn Cal, DDS;  Location: [place];  Service: Oral Surgery;  Laterality: N/A;   SACRAL NERVE STIMULATOR PLACEMENT  2011    reports that he has been smoking cigarettes. He has a 20.00 pack-year smoking history. He has never used smokeless tobacco. He reports current alcohol use. He reports current drug use. Drug: Marijuana. family history includes Colon cancer in his maternal uncle; Diabetes in his mother; Heart disease in his father and mother; Multiple sclerosis in his daughter; Pancreatic cancer in his paternal uncle; Prostate cancer in his father, paternal uncle, and paternal uncle. Allergies  Allergen Reactions   Aspirin Other (See Comments)    ulcers   Celecoxib Other (See Comments)    Stomach irritation   Ibuprofen Other (See Comments)    ulcers   Lyrica [Pregabalin] Swelling   Nsaids    Vicodin [Hydrocodone-Acetaminophen]     Makes me mean       Outpatient Encounter Medications as of 05/01/2021  Medication Sig   NIFEdipine (PROCARDIA XL/NIFEDICAL XL) 60 MG 24 hr tablet  Take 2 tablets by mouth daily.   albuterol (PROVENTIL HFA;VENTOLIN HFA) 108 (90 Base) MCG/ACT inhaler Inhale 2 puffs into the lungs every 6 (six) hours as needed for wheezing or shortness of breath.   amoxicillin-clavulanate (AUGMENTIN) 875-125 MG tablet Take 1 tablet by mouth 2 (two) times daily.   atorvastatin (LIPITOR) 10 MG tablet Take 10 mg by mouth daily.   cetirizine (ZYRTEC) 10 MG tablet Take 10 mg by mouth as needed.   dicyclomine (BENTYL) 10 MG capsule Take 1 capsule (10 mg total) by mouth 3 (three) times daily as needed for spasms.   DULoxetine (CYMBALTA) 20 MG capsule Take 20 mg by mouth daily.   DULoxetine (CYMBALTA) 60 MG capsule Take 120 mg by mouth daily.    fluticasone  (FLONASE) 50 MCG/ACT nasal spray Place 1 spray into both nostrils 2 (two) times daily.   levofloxacin (LEVAQUIN) 500 MG tablet Take 1 tablet (500 mg total) by mouth daily for 10 days.   metoprolol tartrate (LOPRESSOR) 25 MG tablet Take 50 mg by mouth 2 (two) times daily.    montelukast (SINGULAIR) 10 MG tablet Take 10 mg by mouth daily.   NIFEDICAL XL 60 MG 24 hr tablet Take 120 mg by mouth daily.    pantoprazole (PROTONIX) 40 MG tablet Take 1 tablet (40 mg total) by mouth 2 (two) times daily.   rivaroxaban (XARELTO) 20 MG TABS tablet Take 1 tablet (20 mg total) by mouth daily with supper. Please follow up with Primary Care MD for future refills   VOLTAREN 1 % GEL Apply 2 g topically daily as needed (pain). For neck and shoulder pain   [DISCONTINUED] doxycycline (VIBRAMYCIN) 100 MG capsule Take 1 capsule (100 mg total) by mouth 2 (two) times daily.   [DISCONTINUED] isosorbide mononitrate (IMDUR) 60 MG 24 hr tablet Take 60 mg by mouth daily.   [DISCONTINUED] oxyCODONE-acetaminophen (PERCOCET) 5-325 MG tablet Take 1 tablet by mouth daily as needed for severe pain.   [DISCONTINUED] oxyCODONE-acetaminophen (PERCOCET/ROXICET) 5-325 MG tablet Take 1 tablet by mouth every 6 (six) hours as needed for severe pain.   [DISCONTINUED] traMADol (ULTRAM) 50 MG tablet TAKE 1 TO 2 TABLET BY MOUTH EVERY DAY AS NEEDED FOR PAIN   No facility-administered encounter medications on file as of 05/01/2021.     REVIEW OF SYSTEMS  : All other systems reviewed and negative except where noted in the History of Present Illness.   PHYSICAL EXAM: BP 130/70   Pulse 70   Ht '6\' 4"'$  (1.93 m)   Wt 231 lb 8 oz (105 kg)   BMI 28.18 kg/m  General: Well developed AA male in no acute distress Head: Normocephalic and atraumatic Eyes:  Sclerae anicteric, conjunctiva pink. Ears: Normal auditory acuity Lungs: Clear throughout to auscultation; no W/R/R. Heart: Regular rate and rhythm; no M/R/G. Abdomen: Soft, non-distended.  BS  present.  Non-tender. Musculoskeletal: Symmetrical with no gross deformities  Skin: No lesions on visible extremities Extremities: No edema  Neurological: Alert oriented x 4, grossly non-focal Psychological:  Alert and cooperative. Normal mood and affect  ASSESSMENT AND PLAN: *Right flank and right sided back pain: This is not abdominal pain.  CT scan abdomen and pelvis was negative and this pain is associated with movement.  He has severe back problems and actually has a spinal stimulator in place that was apparently manipulated recently.  He has an appointment with Dr. Patrice Paradise next week.  I have asked them to contact his office today to see if he could  be seen sooner since he is in such significant pain. *Chronic diarrhea: This has been present for at least the past couple of years.  Colonoscopy in 2020 was negative for microscopic colitis.  Suspect that this may be a component of bile salt related diarrhea since he does not have his gallbladder.  We will try Questran, 1 packet daily.  Prescription sent to pharmacy.   CC:  No ref. provider found

## 2021-05-03 NOTE — Progress Notes (Signed)
____________________________________________________________  Attending physician addendum:  Thank you for sending this case to me. I have reviewed the entire note and agree with the plan.  Brief trial of Lucrezia Starch is reasonable, as long as he is aware to take it at least 2 hours away from other medicines.  Pharmacist will typically review that with the patient.  Wilfrid Lund, MD  ____________________________________________________________

## 2021-05-23 ENCOUNTER — Ambulatory Visit: Payer: Medicare Other | Admitting: Physician Assistant

## 2021-05-31 ENCOUNTER — Ambulatory Visit (INDEPENDENT_AMBULATORY_CARE_PROVIDER_SITE_OTHER): Payer: Medicare Other | Admitting: Orthopaedic Surgery

## 2021-05-31 ENCOUNTER — Other Ambulatory Visit: Payer: Self-pay

## 2021-05-31 ENCOUNTER — Ambulatory Visit: Payer: Self-pay

## 2021-05-31 ENCOUNTER — Encounter: Payer: Self-pay | Admitting: Orthopaedic Surgery

## 2021-05-31 DIAGNOSIS — M1711 Unilateral primary osteoarthritis, right knee: Secondary | ICD-10-CM | POA: Diagnosis not present

## 2021-05-31 DIAGNOSIS — M25551 Pain in right hip: Secondary | ICD-10-CM

## 2021-05-31 DIAGNOSIS — M79641 Pain in right hand: Secondary | ICD-10-CM | POA: Diagnosis not present

## 2021-05-31 MED ORDER — METHYLPREDNISOLONE ACETATE 40 MG/ML IJ SUSP
40.0000 mg | INTRAMUSCULAR | Status: AC | PRN
  Administered 2021-05-31: 40 mg via INTRA_ARTICULAR

## 2021-05-31 MED ORDER — BUPIVACAINE HCL 0.25 % IJ SOLN
2.0000 mL | INTRAMUSCULAR | Status: AC | PRN
Start: 2021-05-31 — End: 2021-05-31
  Administered 2021-05-31: 2 mL via INTRA_ARTICULAR

## 2021-05-31 MED ORDER — TRAMADOL HCL 50 MG PO TABS: 50.0000 mg | ORAL_TABLET | Freq: Three times a day (TID) | ORAL | 2 refills | Status: DC | PRN

## 2021-05-31 MED ORDER — LIDOCAINE HCL 1 % IJ SOLN
2.0000 mL | INTRAMUSCULAR | Status: AC | PRN
  Administered 2021-05-31: 2 mL

## 2021-05-31 NOTE — Progress Notes (Signed)
Office Visit Note   Patient: [name] [name]           Date of Birth: [birth year]           MRN: [id number] Visit Date: 05/31/2021              Requested by: No referring provider defined for this encounter. PCP: Pcp, No   Assessment & Plan: Visit Diagnoses:  1. Unilateral primary osteoarthritis, right knee   2. Pain in right hand   3. Pain in right hip     Plan: Impression is advanced degenerative joint disease right knee, right lateral hip contusion and right long finger MCP joint avulsion fracture.  In regards to the knee, we have discussed repeat cortisone injection today as he has previously had 1 to 2 months relief in the past.  In regards to the hip, I recommended ice.  In regards to the hand, we have buddy taped the long and ring fingers.  He will follow-up with [place] in 3 weeks time for repeat evaluation and x-rays of the right hand.  Call with concerns or questions in the meantime.  Follow-Up Instructions: Return in about 3 weeks (around 06/21/2021).   Orders:  Orders Placed This Encounter  Procedures   Large Joint Inj: R knee   XR HIP UNILAT W OR W/O PELVIS 2-3 VIEWS RIGHT   XR Hand Complete Right   Meds ordered this encounter  Medications   traMADol (ULTRAM) 50 MG tablet    Sig: Take 1 tablet (50 mg total) by mouth 3 (three) times daily as needed.    Dispense:  30 tablet    Refill:  2       Procedures: Large Joint Inj: R knee on 05/31/2021 9:16 AM Indications: pain Details: 22 G needle, anterolateral approach Medications: 2 mL lidocaine 1 %; 2 mL bupivacaine 0.25 %; 40 mg methylPREDNISolone acetate 40 MG/ML     Clinical Data: No additional findings.   Subjective: Chief Complaint  Patient presents with   Right Knee - Pain   Right Hand - Injury    DOI 05/27/2021   Right Hip - Injury    HPI: Patient is a pleasant 70 year old gentleman who comes in today with recurrent right knee pain in addition to new onset right hand and right lateral hip pain after  sustaining a fall this past weekend.  In regards to his knee, he has a history of advanced degenerative joint disease and has had cortisone and viscosupplementation injections in the past without relief of symptoms.  Total knee arthroplasty has previously been discussed but has been avoided due to the patient being on chronic Xarelto for a history of multiple bilateral PEs.  In regards to his hand and hip, he fell onto his right side after tripping over his dog this past weekend.  He has had pain to the MCP joint of the long finger in addition to the right lateral hip.  He has been using ice and taking Tylenol for the hand.  He does have increased pain with movement of the long finger.  In regards to his hip, the pain he has is primarily when sleeping on the right side and occasionally when walking.  No groin pain.  Review of Systems as detailed in HPI.  All others reviewed and are negative.   Objective: Vital Signs: There were no vitals taken for this visit.  Physical Exam well-developed well-nourished gentleman in no acute distress.  Alert and oriented x3.  Ortho  Exam right knee exam shows no effusion.  Range of motion 0 to 115 degrees.  Medial joint line tenderness.  Moderate patellofemoral crepitus.  He is neurovascular intact distally.  Right hand exam shows moderate tenderness, swelling and ecchymosis to the MCP joint of the long finger.  Increased pain with movement of the collaterals as well as with flexion extension.  Right hip exam shows negative logroll negative FADIR.  Negative Stinchfield.  He does have moderate tenderness to the greater trochanter.  He is neurovascular intact distally.  Specialty Comments:  No specialty comments available.  Imaging: XR HIP UNILAT W OR W/O PELVIS 2-3 VIEWS RIGHT  Result Date: 05/31/2021 No acute or structural abnormalities  XR Hand Complete Right  Result Date: 05/31/2021 Rays demonstrate small avulsion fracture off the MCP joint of the long finger     PMFS History: Patient Active Problem List   Diagnosis Date Noted   Right flank pain 05/01/2021   Chronic right-sided back pain 05/01/2021   Chronic diarrhea 05/01/2021   Primary osteoarthritis of left knee 03/29/2020   Primary osteoarthritis of right knee 07/08/2017   Complete rotator cuff tear of left shoulder 10/21/2016   Gout attack 04/02/2016   Angina pectoris ([place]) 08/01/2014   Hemorrhage of rectum and anus 03/28/2014   Neuropathy, peripheral 05/15/2012   Pulmonary embolism ([place]) 07/11/2011   DVT (deep venous thrombosis) ([place]) 07/11/2011   Abdominal pain, left lower quadrant 01/29/2011   Long term current use of anticoagulant therapy 01/29/2011   Diarrhea 01/29/2011   Past Medical History:  Diagnosis Date   Anxiety and depression    Back pain    Diverticulitis    Diverticulosis    DVT (deep venous thrombosis) (HCC)    Gout    Hernia    History of blood clots    Hypertension    Nausea and vomiting    Neuropathy, peripheral 05/15/2012   patient denies   Shingles     Family History  Problem Relation Age of Onset   Prostate cancer Father    Heart disease Father    Diabetes Mother    Heart disease Mother    Colon cancer Maternal Uncle        dx in his 109's   Pancreatic cancer Paternal Uncle    Prostate cancer Paternal Uncle    Multiple sclerosis Daughter    Prostate cancer Paternal Uncle    Esophageal cancer Neg Hx    Rectal cancer Neg Hx    Stomach cancer Neg Hx     Past Surgical History:  Procedure Laterality Date   APPENDECTOMY     BACK SURGERY     CHOLECYSTECTOMY  2008   HERNIA REPAIR     HIATAL HERNIA REPAIR     IR ANGIO INTRA EXTRACRAN SEL INTERNAL CAROTID BILAT MOD SED  01/18/2020   IR ANGIO VERTEBRAL SEL VERTEBRAL BILAT MOD SED  01/18/2020   IR US GUIDE VASC ACCESS RIGHT  01/18/2020   LEFT HEART CATHETERIZATION WITH CORONARY ANGIOGRAM N/A 08/02/2014   Procedure: LEFT HEART CATHETERIZATION WITH CORONARY ANGIOGRAM;  Surgeon: Laverda Page, MD;   Location: [facility] CATH LAB;  Service: Cardiovascular;  Laterality: N/A;   MULTIPLE EXTRACTIONS WITH ALVEOLOPLASTY N/A 10/19/2015   Procedure: Extraction of tooth #'s 2,12,13 with alveoloplasty;  Surgeon: Lenn Cal, DDS;  Location: [place];  Service: Oral Surgery;  Laterality: N/A;   SACRAL NERVE STIMULATOR PLACEMENT  2011   Social History   Occupational History   Occupation: unemployed  Employer: OTHER    Employer: RETIRED  Tobacco Use   Smoking status: Every Day    Packs/day: 0.50    Years: 40.00    Pack years: 20.00    Types: Cigarettes   Smokeless tobacco: Never  Vaping Use   Vaping Use: Never used  Substance and Sexual Activity   Alcohol use: Yes    Alcohol/week: 0.0 standard drinks    Comment: Occasional   Drug use: Yes    Types: Marijuana    Comment: for nausea   Sexual activity: Not on file

## 2021-08-16 ENCOUNTER — Telehealth: Payer: Self-pay

## 2021-08-16 NOTE — Telephone Encounter (Signed)
Patient's wife, Tomie China, called to say that Krista Blue, PA-C, spoke with patient yesterday and he was confused about what to do.  Also, she said that the patient was supposed to have a prescription called into the pharmacy and he has not seen it yet.  Please call.

## 2021-08-22 NOTE — Telephone Encounter (Signed)
Called and spoke with the patient's wife on (08/16/21) regarding the message below.  The wife stated that the patient spoke with Loney Loh on yesterday and they are confused with what to do.    Informed the patient's wife that I spoke with Garrett,PA-C and also looked into the chart, and Garrett,PA-C stated that he has not spoken with the patient.  It has been 16 months ago since he spoke with the patient and it was when Galen Manila was working in [employer] ER.    The wife stated that they received the call on yesterday,and it was Garret,PA-C.  Informed the patient's wife that it was not Garret,PA-C or anyone else from our office.   The wife stated that someone most be using Garrett,PA-C phone number.  Because they said it was [place].    Informed the patient's wife that I spoke with Donna Christen again and he wanted me to let her know that he will give them a call back.  The wife verbalized understanding and agreed.//AB/CMA

## 2021-09-05 ENCOUNTER — Other Ambulatory Visit: Payer: Self-pay | Admitting: Gastroenterology

## 2021-09-05 DIAGNOSIS — K529 Noninfective gastroenteritis and colitis, unspecified: Secondary | ICD-10-CM

## 2021-09-05 DIAGNOSIS — R109 Unspecified abdominal pain: Secondary | ICD-10-CM

## 2021-09-05 DIAGNOSIS — G8929 Other chronic pain: Secondary | ICD-10-CM

## 2021-10-03 ENCOUNTER — Ambulatory Visit: Payer: Medicare Other | Admitting: Physician Assistant

## 2021-10-04 ENCOUNTER — Ambulatory Visit (INDEPENDENT_AMBULATORY_CARE_PROVIDER_SITE_OTHER): Payer: Medicare Other | Admitting: Physician Assistant

## 2021-10-04 ENCOUNTER — Encounter: Payer: Self-pay | Admitting: Physician Assistant

## 2021-10-04 VITALS — BP 140/76 | HR 68 | Ht 76.0 in | Wt 227.8 lb

## 2021-10-04 DIAGNOSIS — K219 Gastro-esophageal reflux disease without esophagitis: Secondary | ICD-10-CM

## 2021-10-04 DIAGNOSIS — R1084 Generalized abdominal pain: Secondary | ICD-10-CM | POA: Diagnosis not present

## 2021-10-04 DIAGNOSIS — K529 Noninfective gastroenteritis and colitis, unspecified: Secondary | ICD-10-CM

## 2021-10-04 DIAGNOSIS — M549 Dorsalgia, unspecified: Secondary | ICD-10-CM | POA: Diagnosis not present

## 2021-10-04 DIAGNOSIS — R110 Nausea: Secondary | ICD-10-CM

## 2021-10-04 DIAGNOSIS — G8929 Other chronic pain: Secondary | ICD-10-CM

## 2021-10-04 MED ORDER — CHOLESTYRAMINE 4 G PO PACK: 4.0000 g | PACK | Freq: Every day | ORAL | 3 refills | Status: DC

## 2021-10-04 MED ORDER — ONDANSETRON HCL 4 MG PO TABS: 4.0000 mg | ORAL_TABLET | ORAL | 2 refills | Status: DC | PRN

## 2021-10-04 MED ORDER — DICYCLOMINE HCL 10 MG PO CAPS: 10.0000 mg | ORAL_CAPSULE | Freq: Three times a day (TID) | ORAL | 3 refills | Status: DC

## 2021-10-04 MED ORDER — PANTOPRAZOLE SODIUM 40 MG PO TBEC: 40.0000 mg | DELAYED_RELEASE_TABLET | Freq: Two times a day (BID) | ORAL | 5 refills | Status: DC

## 2021-10-04 NOTE — Progress Notes (Signed)
Chief Complaint: Abdominal pain  HPI:    [name] is a 71 year old African-American male with past medical history of DVT on Xarelto and multiple others, known to Dr. Loletha Carrow, who presents clinic today with a complaint of abdominal pain.    October 2020 colonoscopy negative for microscopic colitis.    05/01/2021 office visit with Alonza Bogus and discussed abdominal pain and diarrhea.  At that time had right-sided abdominal pain for the past few weeks.  He had CT scan of the abdomen and pelvis with contrast which did not show any intra-abdominal source.  Discussed severe back issues with nerve/spinal stimulator in place.  At that time discussed this right flank/right-sided back pain was not abdominal pain.  We discussed this is likely related to his spinal stimulator which had recently been manipulated.  Discussed chronic diarrhea which was present for the past couple of years and her suspected component of bile salt related.  Tried on Questran 1 packet daily.    Today, the patient presents to clinic accompanied by his family member and describes that he really does not feel well at all.  He feels nauseous even with the smell of some foods and describes a generalized abdominal pain and spasms along with continued diarrhea.  He tells me admittedly he has not been taking any of his GI medications.  Apparently he never tried the Cholestyramine and is not using his Bentyl 10 mg 4 times daily or his Pantoprazole 40 mg twice daily.  He is not really sure how long he has been off of all of these things.  He just tells me that he is miserable and he is also having back spasms.  His family member tells me that he is having greasy food and that his diet is likely contributing.    Denies fever, chills, blood in his stool or symptoms that awaken him from sleep.  Past Medical History:  Diagnosis Date   Anxiety and depression    Back pain    Diverticulitis    Diverticulosis    DVT (deep venous thrombosis) (HCC)     Gout    Hernia    History of blood clots    Hypertension    Nausea and vomiting    Neuropathy, peripheral 05/15/2012   patient denies   Shingles     Past Surgical History:  Procedure Laterality Date   APPENDECTOMY     BACK SURGERY     CHOLECYSTECTOMY  2008   HERNIA REPAIR     HIATAL HERNIA REPAIR     IR ANGIO INTRA EXTRACRAN SEL INTERNAL CAROTID BILAT MOD SED  01/18/2020   IR ANGIO VERTEBRAL SEL VERTEBRAL BILAT MOD SED  01/18/2020   IR US GUIDE VASC ACCESS RIGHT  01/18/2020   LEFT HEART CATHETERIZATION WITH CORONARY ANGIOGRAM N/A 08/02/2014   Procedure: LEFT HEART CATHETERIZATION WITH CORONARY ANGIOGRAM;  Surgeon: Laverda Page, MD;  Location: [facility] CATH LAB;  Service: Cardiovascular;  Laterality: N/A;   MULTIPLE EXTRACTIONS WITH ALVEOLOPLASTY N/A 10/19/2015   Procedure: Extraction of tooth #'s 2,12,13 with alveoloplasty;  Surgeon: Lenn Cal, DDS;  Location: [place];  Service: Oral Surgery;  Laterality: N/A;   SACRAL NERVE STIMULATOR PLACEMENT  2011    Current Outpatient Medications  Medication Sig Dispense Refill   albuterol (PROVENTIL HFA;VENTOLIN HFA) 108 (90 Base) MCG/ACT inhaler Inhale 2 puffs into the lungs every 6 (six) hours as needed for wheezing or shortness of breath. 1 Inhaler 2   amoxicillin-clavulanate (AUGMENTIN) 875-125 MG  tablet Take 1 tablet by mouth 2 (two) times daily.     atorvastatin (LIPITOR) 10 MG tablet Take 10 mg by mouth daily.     cetirizine (ZYRTEC) 10 MG tablet Take 10 mg by mouth as needed.  3   cholestyramine (QUESTRAN) 4 g packet MIX AND DRINK 1 PACKET(4 GRAMS) BY MOUTH DAILY WITH LUNCH 30 each 3   dicyclomine (BENTYL) 10 MG capsule Take 1 capsule (10 mg total) by mouth 3 (three) times daily as needed for spasms. 60 capsule 1   DULoxetine (CYMBALTA) 20 MG capsule Take 20 mg by mouth daily.     DULoxetine (CYMBALTA) 60 MG capsule Take 120 mg by mouth daily.      fluticasone (FLONASE) 50 MCG/ACT nasal spray Place 1 spray into both nostrils 2  (two) times daily.  0   metoprolol tartrate (LOPRESSOR) 25 MG tablet Take 50 mg by mouth 2 (two) times daily.   0   montelukast (SINGULAIR) 10 MG tablet Take 10 mg by mouth daily.     NIFEDICAL XL 60 MG 24 hr tablet Take 120 mg by mouth daily.      NIFEdipine (PROCARDIA XL/NIFEDICAL XL) 60 MG 24 hr tablet Take 2 tablets by mouth daily.     pantoprazole (PROTONIX) 40 MG tablet Take 1 tablet (40 mg total) by mouth 2 (two) times daily. 60 tablet 5   rivaroxaban (XARELTO) 20 MG TABS tablet Take 1 tablet (20 mg total) by mouth daily with supper. Please follow up with Primary Care MD for future refills 30 tablet 0   traMADol (ULTRAM) 50 MG tablet Take 1 tablet (50 mg total) by mouth 3 (three) times daily as needed. 30 tablet 2   VOLTAREN 1 % GEL Apply 2 g topically daily as needed (pain). For neck and shoulder pain     No current facility-administered medications for this visit.    Allergies as of 10/04/2021 - Review Complete 05/31/2021  Allergen Reaction Noted   Aspirin Other (See Comments) 01/29/2011   Celecoxib Other (See Comments) 11/28/2011   Ibuprofen Other (See Comments) 01/29/2011   Lyrica [pregabalin] Swelling 01/29/2011   Nsaids  08/22/2011   Vicodin [hydrocodone-acetaminophen]  10/16/2015    Family History  Problem Relation Age of Onset   Prostate cancer Father    Heart disease Father    Diabetes Mother    Heart disease Mother    Colon cancer Maternal Uncle        dx in his 29's   Pancreatic cancer Paternal Uncle    Prostate cancer Paternal Uncle    Multiple sclerosis Daughter    Prostate cancer Paternal Uncle    Esophageal cancer Neg Hx    Rectal cancer Neg Hx    Stomach cancer Neg Hx     Social History   Socioeconomic History   Marital status: Significant Other    Spouse name: Water quality scientist   Number of children: 3   Years of education: 12th   Highest education level: Not on file  Occupational History   Occupation: unemployed    Fish farm manager: OTHER    Employer:  RETIRED  Tobacco Use   Smoking status: Every Day    Packs/day: 0.50    Years: 40.00    Pack years: 20.00    Types: Cigarettes   Smokeless tobacco: Never  Vaping Use   Vaping Use: Never used  Substance and Sexual Activity   Alcohol use: Yes    Alcohol/week: 0.0 standard drinks    Comment: Occasional  Drug use: Yes    Types: Marijuana    Comment: for nausea   Sexual activity: Not on file  Other Topics Concern   Not on file  Social History Narrative   Patient lives at home with his family.   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    Caffeine use: 1-2 cups daily   Social Determinants of Health   Financial Resource Strain: Not on file  Food Insecurity: Not on file  Transportation Needs: Not on file  Physical Activity: Not on file  Stress: Not on file  Social Connections: Not on file  Intimate Partner Violence: Not on file    Review of Systems:    Constitutional: No weight loss, fever or chills Cardiovascular: No chest pain Respiratory: No SOB  Gastrointestinal: See HPI and otherwise negative   Physical Exam:  Vital signs: BP 140/76    Pulse 68    Ht 6\' 4"  (1.93 m)    Wt 227 lb 12.8 oz (103.3 kg)    SpO2 98%    BMI 27.73 kg/m    Constitutional:   Pleasant AA male appears to be in NAD, Well developed, Well nourished, alert and cooperative Respiratory: Respirations even and unlabored. Lungs clear to auscultation bilaterally.   No wheezes, crackles, or rhonchi.  Cardiovascular: Normal S1, S2. No MRG. Regular rate and rhythm. No peripheral edema, cyanosis or pallor.  Gastrointestinal:  Soft, nondistended, moderate generalized abdominal TTP with some involuntary guarding, normal bowel sounds. No appreciable masses or hepatomegaly. Rectal:  Not performed.  Msk:  Symmetrical without gross deformities. Without edema, no deformity or joint abnormality. + With multiple obvious back spasms during his visit Psychiatric: Oriented to person, place and time.  Demonstrates good judgement and reason without abnormal affect or behaviors.  RELEVANT LABS AND IMAGING: CBC    Component Value Date/Time   WBC 5.1 04/25/2021 2116   RBC 5.12 04/25/2021 2116   HGB 12.0 (L) 04/25/2021 2116   HGB 13.2 03/08/2020 1117   HGB 13.5 09/17/2018 1057   HGB 13.1 04/04/2014 1110   HCT 37.6 (L) 04/25/2021 2116   HCT 42.2 09/17/2018 1057   HCT 41.7 04/04/2014 1110   PLT 256 04/25/2021 2116   PLT 272 03/08/2020 1117   PLT 273 09/17/2018 1057   MCV 73.4 (L) 04/25/2021 2116   MCV 73 (L) 09/17/2018 1057   MCV 72.4 (L) 04/04/2014 1110   MCH 23.4 (L) 04/25/2021 2116   MCHC 31.9 04/25/2021 2116   RDW 15.9 (H) 04/25/2021 2116   RDW 15.4 09/17/2018 1057   RDW 16.5 (H) 04/04/2014 1110   LYMPHSABS 1.9 04/25/2021 2116   LYMPHSABS 1.2 09/17/2018 1057   LYMPHSABS 1.5 04/04/2014 1110   MONOABS 0.5 04/25/2021 2116   MONOABS 0.5 04/04/2014 1110   EOSABS 0.1 04/25/2021 2116   EOSABS 0.1 09/17/2018 1057   BASOSABS 0.0 04/25/2021 2116   BASOSABS 0.0 09/17/2018 1057   BASOSABS 0.1 04/04/2014 1110    CMP     Component Value Date/Time   NA 138 04/25/2021 2116   NA 143 09/17/2018 1057   NA 143 04/04/2014 1116   K 3.3 (L) 04/25/2021 2116   K 3.7 04/04/2014 1116   CL 103 04/25/2021 2116   CO2 28 04/25/2021 2116   CO2 30 (H) 04/04/2014 1116   GLUCOSE 92 04/25/2021 2116   GLUCOSE 89 04/04/2014 1116   BUN 8 04/25/2021 2116   BUN 12 09/17/2018 1057   BUN 10.1 04/04/2014 1116  CREATININE 1.21 04/25/2021 2116   CREATININE 1.12 03/08/2020 1117   CREATININE 1.0 04/04/2014 1116   CALCIUM 9.4 04/25/2021 2116   CALCIUM 9.3 04/04/2014 1116   PROT 6.4 (L) 04/25/2021 2116   PROT 6.3 03/17/2017 1134   PROT 6.6 04/04/2014 1116   ALBUMIN 3.8 04/25/2021 2116   ALBUMIN 4.1 03/17/2017 1134   ALBUMIN 3.8 04/04/2014 1116   AST 22 04/25/2021 2116   AST 15 03/08/2020 1117   AST 14 04/04/2014 1116   ALT 16 04/25/2021 2116   ALT 32 03/08/2020 1117   ALT 10 04/04/2014 1116    ALKPHOS 46 04/25/2021 2116   ALKPHOS 45 04/04/2014 1116   BILITOT 0.7 04/25/2021 2116   BILITOT 0.3 03/08/2020 1117   BILITOT 0.48 04/04/2014 1116   GFRNONAA >60 04/25/2021 2116   GFRNONAA >60 03/08/2020 1117   GFRAA >60 03/08/2020 1117    Assessment: 1.  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  GERD: With nausea, currently not on medicine 3.  Chronic diarrhea: This has been evaluated extensively in the past, patient was to trial Cholestyramine but never did he is postcholecystectomy there could be an element of bile salt diarrhea  Plan: 1.  Discussed with patient that I feel like he is in so much pain because he is not on any of his regular medicines which controlled his chronic GI symptoms. 2.  Refilled Pantoprazole 40 twice daily 3.  Refilled Dicyclomine 10 mg 4 times daily 4.  Refilled Cholestyramine once daily.  Discussed usage of this.  Discussed that this should help him with his diarrhea. 5.  Discussed antireflux diet.  Provided handout. 6.  Patient follow in clinic with me in 4 to 6 weeks.  We will need to discuss possible repeat EGD/colon at that time.  Ellouise Newer, PA-C [facility] 10/04/2021, 2:30 PM  Cc: No ref. provider found

## 2021-10-04 NOTE — Progress Notes (Signed)
____________________________________________________________  Attending physician addendum:  Thank you for sending this case to me. I have reviewed the entire note and agree with the plan.  He has had these symptoms of a functional bowel disorder for at least a decade, with previous evaluation by Dr. Deatra Ina and then colonoscopy with me in 2020.  Choice of medicines to treat made challenging by his bowel habits that tend to alternate at times and his willingness to take treatment.  Wilfrid Lund, MD  ____________________________________________________________

## 2021-10-04 NOTE — Patient Instructions (Addendum)
We have sent the following medications to your pharmacy for you to pick up at your convenience: Pantoprazole 40 mg twice daily 30-60 minutes before breakfast and dinner. Cholestyramine 4 g packet daily with lunch. Dicyclomine 10 mg four times daily before meals and at bedtime. Zofran 4 mg every 4-6 hours as needed.  If you are age 72 or older, your body mass index should be between 23-30. Your Body mass index is 27.73 kg/m. If this is out of the aforementioned range listed, please consider follow up with your Primary Care Provider.  If you are age 38 or younger, your body mass index should be between 19-25. Your Body mass index is 27.73 kg/m. If this is out of the aformentioned range listed, please consider follow up with your Primary Care Provider.   ________________________________________________________  The [facility] providers would like to encourage you to use [facility] to communicate with providers for non-urgent requests or questions.  Due to long hold times on the telephone, sending your provider a message by [facility] may be a faster and more efficient way to get a response.  Please allow 48 business hours for a response.  Please remember that this is for non-urgent requests.  _______________________________________________________

## 2021-10-08 ENCOUNTER — Ambulatory Visit: Payer: Medicare Other | Admitting: Physician Assistant

## 2021-10-17 ENCOUNTER — Ambulatory Visit: Payer: Medicare Other | Admitting: Physician Assistant

## 2021-11-01 ENCOUNTER — Ambulatory Visit: Payer: Medicare Other | Admitting: Physician Assistant

## 2022-01-02 ENCOUNTER — Emergency Department (HOSPITAL_BASED_OUTPATIENT_CLINIC_OR_DEPARTMENT_OTHER): Payer: Medicare Other

## 2022-01-02 ENCOUNTER — Other Ambulatory Visit: Payer: Self-pay

## 2022-01-02 ENCOUNTER — Encounter (HOSPITAL_COMMUNITY): Payer: Self-pay

## 2022-01-02 ENCOUNTER — Emergency Department (HOSPITAL_COMMUNITY)
Admission: EM | Admit: 2022-01-02 | Discharge: 2022-01-02 | Discharge status: Against Medical Advice | Payer: Medicare Other | Attending: Emergency Medicine | Admitting: Emergency Medicine

## 2022-01-02 DIAGNOSIS — I82401 Acute embolism and thrombosis of unspecified deep veins of right lower extremity: Secondary | ICD-10-CM | POA: Diagnosis not present

## 2022-01-02 DIAGNOSIS — M7989 Other specified soft tissue disorders: Secondary | ICD-10-CM

## 2022-01-02 DIAGNOSIS — M545 Low back pain, unspecified: Secondary | ICD-10-CM | POA: Diagnosis not present

## 2022-01-02 DIAGNOSIS — Z79899 Other long term (current) drug therapy: Secondary | ICD-10-CM | POA: Insufficient documentation

## 2022-01-02 DIAGNOSIS — F17200 Nicotine dependence, unspecified, uncomplicated: Secondary | ICD-10-CM | POA: Insufficient documentation

## 2022-01-02 DIAGNOSIS — Z7901 Long term (current) use of anticoagulants: Secondary | ICD-10-CM | POA: Insufficient documentation

## 2022-01-02 DIAGNOSIS — I10 Essential (primary) hypertension: Secondary | ICD-10-CM | POA: Diagnosis not present

## 2022-01-02 DIAGNOSIS — R109 Unspecified abdominal pain: Secondary | ICD-10-CM | POA: Diagnosis not present

## 2022-01-02 DIAGNOSIS — Z5321 Procedure and treatment not carried out due to patient leaving prior to being seen by health care provider: Secondary | ICD-10-CM | POA: Diagnosis not present

## 2022-01-02 DIAGNOSIS — I824Y1 Acute embolism and thrombosis of unspecified deep veins of right proximal lower extremity: Secondary | ICD-10-CM | POA: Insufficient documentation

## 2022-01-02 DIAGNOSIS — M79604 Pain in right leg: Secondary | ICD-10-CM

## 2022-01-02 LAB — CBC WITH DIFFERENTIAL/PLATELET
Abs Immature Granulocytes: 0.01 10*3/uL (ref 0.00–0.07)
Basophils Absolute: 0 10*3/uL (ref 0.0–0.1)
Basophils Relative: 1 %
Eosinophils Absolute: 0.1 10*3/uL (ref 0.0–0.5)
Eosinophils Relative: 1 %
HCT: 39 % (ref 39.0–52.0)
Hemoglobin: 12.6 g/dL — ABNORMAL LOW (ref 13.0–17.0)
Immature Granulocytes: 0 %
Lymphocytes Relative: 30 %
Lymphs Abs: 1.6 10*3/uL (ref 0.7–4.0)
MCH: 23.5 pg — ABNORMAL LOW (ref 26.0–34.0)
MCHC: 32.3 g/dL (ref 30.0–36.0)
MCV: 72.8 fL — ABNORMAL LOW (ref 80.0–100.0)
Monocytes Absolute: 0.4 10*3/uL (ref 0.1–1.0)
Monocytes Relative: 8 %
Neutro Abs: 3.1 10*3/uL (ref 1.7–7.7)
Neutrophils Relative %: 60 %
Platelets: UNDETERMINED 10*3/uL (ref 150–400)
RBC: 5.36 MIL/uL (ref 4.22–5.81)
RDW: 16.6 % — ABNORMAL HIGH (ref 11.5–15.5)
WBC: 5.2 10*3/uL (ref 4.0–10.5)
nRBC: 0 % (ref 0.0–0.2)

## 2022-01-02 LAB — BASIC METABOLIC PANEL
Anion gap: 10 (ref 5–15)
BUN: 9 mg/dL (ref 8–23)
CO2: 26 mmol/L (ref 22–32)
Calcium: 9.2 mg/dL (ref 8.9–10.3)
Chloride: 100 mmol/L (ref 98–111)
Creatinine, Ser: 0.78 mg/dL (ref 0.61–1.24)
GFR, Estimated: >60 mL/min (ref >60)
Glucose, Bld: 102 mg/dL — ABNORMAL HIGH (ref 70–99)
Potassium: 3.2 mmol/L — ABNORMAL LOW (ref 3.5–5.1)
Sodium: 136 mmol/L (ref 135–145)

## 2022-01-02 NOTE — ED Provider Triage Note (Signed)
Emergency Medicine Provider Triage Evaluation Note ? ?[name] [name] , a 70 y.o. male  was evaluated in triage.  Pt complains of swelling and tenderness to right leg.  Patient reports that he had back surgery on 3/22.  Since the surgery he has had progressively worsening swelling and tenderness to right leg.  Patient was advised to come to the emergency department by his PCP to have evaluation for DVT.  Patient reports that he is on Xarelto.  Missed 3 days of Xarelto around his surgery.  Restarted Xarelto on 3/24.  Has not missed any doses since then. ? ?Denies any chest pain, shortness of breath, hemoptysis. ? ?Review of Systems  ?Positive: Right leg swelling and tenderness ?Negative: See above ? ?Physical Exam  ?BP 128/82 (BP Location: Left Arm)   Pulse 92   Temp 98.6 ?F (37 ?C) (Oral)   Resp 18   Ht '6\' 4"'$  (1.93 m)   Wt 103 kg   SpO2 96%   BMI 27.63 kg/m?  ?Gen:   Awake, no distress   ?Resp:  Normal effort  ?MSK:   Moves extremities without difficulty  ?Other:  Swelling to right calf.  Tenderness to right thigh.  +1 right DP pulse.  Sensation intact to digits of right foot. ? ?Medical Decision Making  ?Medically screening exam initiated at 1:29 PM.  Appropriate orders placed.  [name] [name] was informed that the remainder of the evaluation will be completed by another provider, this initial triage assessment does not replace that evaluation, and the importance of remaining in the ED until their evaluation is complete. ? ? ?  ?Loni Beckwith, PA-C ?01/02/22 1330 ? ?

## 2022-01-02 NOTE — Progress Notes (Signed)
Lower extremity venous RT study completed. ? ?Preliminary results relayed to [place], [place] via secure chat. ? ?See CV Proc for preliminary results report.  ? ?Darlin Coco, RDMS, RVT ? ?

## 2022-01-02 NOTE — Consult Note (Signed)
? ?[facility] ? ?Patient name: [name] [name] MRN: [id number] DOB: 03/15/1951 Sex: male ? ? ?REQUESTING PROVIDER:  ? ? ER ? ? ?REASON FOR CONSULT:  ?  ?Right leg DVT ? ?HISTORY OF PRESENT ILLNESS:  ? ?[name] [name] is a 71 y.o. male, who presented to the emergency department with a 2-week history of right leg pain.  The patient underwent back surgery about a month ago.  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  He has been on Coumadin and Xarelto.  His Xarelto was stopped 3 days prior to his back surgery.  He did not get Lovenox bridging.  He states that he has chronic right leg swelling and does wear compression socks.  He states that the swelling is not any better or worse than it usually is. ? ?The patient is medically managed for hypertension.  He is a current smoker.  He does have some numbness in his right leg as well as some skin discoloration. ? ?PAST MEDICAL HISTORY  ? ? ?Past Medical History:  ?Diagnosis Date  ? Anxiety and depression   ? Back pain   ? Diverticulitis   ? Diverticulosis   ? DVT (deep venous thrombosis) ([place])   ? Gout   ? Hernia   ? History of blood clots   ? Hypertension   ? Nausea and vomiting   ? Neuropathy, peripheral 05/15/2012  ? patient denies  ? Shingles   ? ? ? ?FAMILY HISTORY  ? ?Family History  ?Problem Relation Age of Onset  ? Prostate cancer Father   ? Heart disease Father   ? Diabetes Mother   ? Heart disease Mother   ? Colon cancer Maternal Uncle   ?     dx in his 66's  ? Pancreatic cancer Paternal Uncle   ? Prostate cancer Paternal Uncle   ? Multiple sclerosis Daughter   ? Prostate cancer Paternal Uncle   ? Esophageal cancer Neg Hx   ? Rectal cancer Neg Hx   ? Stomach cancer Neg Hx   ? ? ?SOCIAL HISTORY:  ? ?Social History  ? ?Socioeconomic History  ? Marital status: Significant Other  ?  Spouse name: Nyra Jabs  ? Number of children: 3   ? Years of education: 12th  ? Highest education level: Not on file  ?Occupational History  ? Occupation: unemployed  ?  Employer: OTHER  ?  Employer: RETIRED  ?Tobacco Use  ? Smoking status: Every Day  ?  Packs/day: 0.50  ?  Years: 40.00  ?  Pack years: 20.00  ?  Types: Cigarettes  ? Smokeless tobacco: Never  ?Vaping Use  ? Vaping Use: Never used  ?Substance and Sexual Activity  ? Alcohol use: Yes  ?  Alcohol/week: 0.0 standard drinks  ?  Comment: Occasional  ? Drug use: Yes  ?  Types: Marijuana  ?  Comment: for nausea  ? Sexual activity: Not on file  ?Other Topics Concern  ? Not on file  ?Social History Narrative  ? Patient lives at home with his family.  ? 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   ? Caffeine use: 1-2 cups daily  ? ?Social Determinants of Health  ? ?Financial Resource Strain: Not on file  ?Food Insecurity: Not on file  ?Transportation Needs: Not on file  ?Physical Activity: Not on file  ?Stress: Not  on file  ?Social Connections: Not on file  ?Intimate Partner Violence: Not on file  ? ? ?ALLERGIES:  ? ? ?Allergies  ?Allergen Reactions  ? Aspirin Other (See Comments)  ?  ulcers  ? Celecoxib Other (See Comments)  ?  Stomach irritation  ? Ibuprofen Other (See Comments)  ?  ulcers  ? Lyrica [Pregabalin] Swelling  ? Nsaids   ? Vicodin [Hydrocodone-Acetaminophen]   ?  Makes me mean   ? ? ?CURRENT MEDICATIONS:  ? ? ?No current facility-administered medications for this encounter.  ? ?Current Outpatient Medications  ?Medication Sig Dispense Refill  ? albuterol (PROVENTIL HFA;VENTOLIN HFA) 108 (90 Base) MCG/ACT inhaler Inhale 2 puffs into the lungs every 6 (six) hours as needed for wheezing or shortness of breath. 1 Inhaler 2  ? atorvastatin (LIPITOR) 10 MG tablet Take 10 mg by mouth daily.    ? cholestyramine (QUESTRAN) 4 g packet Take 1 packet (4 g total) by mouth daily before lunch. 30 each 3  ? dicyclomine (BENTYL) 10 MG capsule Take 1 capsule (10 mg total) by mouth 4 (four)  times daily -  before meals and at bedtime. 120 capsule 3  ? DULoxetine (CYMBALTA) 20 MG capsule Take 20 mg by mouth daily.    ? DULoxetine (CYMBALTA) 60 MG capsule Take 120 mg by mouth daily.     ? fluticasone (FLONASE) 50 MCG/ACT nasal spray Place 1 spray into both nostrils 2 (two) times daily.  0  ? metoprolol tartrate (LOPRESSOR) 25 MG tablet Take 50 mg by mouth 2 (two) times daily.   0  ? montelukast (SINGULAIR) 10 MG tablet Take 10 mg by mouth daily.    ? NIFEDICAL XL 60 MG 24 hr tablet Take 120 mg by mouth daily.     ? NIFEdipine (PROCARDIA XL/NIFEDICAL XL) 60 MG 24 hr tablet Take 2 tablets by mouth daily.    ? ondansetron (ZOFRAN) 4 MG tablet Take 1 tablet (4 mg total) by mouth every 4 (four) hours as needed for nausea or vomiting. 30 tablet 2  ? pantoprazole (PROTONIX) 40 MG tablet Take 1 tablet (40 mg total) by mouth 2 (two) times daily. 60 tablet 5  ? rivaroxaban (XARELTO) 20 MG TABS tablet Take 1 tablet (20 mg total) by mouth daily with supper. Please follow up with Primary Care MD for future refills 30 tablet 0  ? VOLTAREN 1 % GEL Apply 2 g topically daily as needed (pain). For neck and shoulder pain    ? ? ?REVIEW OF SYSTEMS:  ? ?'[X]'$  denotes positive finding, '[ ]'$  denotes negative finding ?Cardiac  Comments:  ?Chest pain or chest pressure:    ?Shortness of breath upon exertion:    ?Short of breath when lying flat:    ?Irregular heart rhythm:    ?    ?Vascular    ?Pain in calf, thigh, or hip brought on by ambulation:    ?Pain in feet at night that wakes you up from your sleep:     ?Blood clot in your veins:    ?Leg swelling:  x   ?    ?Pulmonary    ?Oxygen at home:    ?Productive cough:     ?Wheezing:     ?    ?Neurologic    ?Sudden weakness in arms or legs:     ?Sudden numbness in arms or legs:     ?Sudden onset of difficulty speaking or slurred speech:    ?Temporary loss of vision in one  eye:     ?Problems with dizziness:     ?    ?Gastrointestinal    ?Blood in stool:     ? ?Vomited blood:     ?     ?Genitourinary    ?Burning when urinating:     ?Blood in urine:    ?    ?Psychiatric    ?Major depression:     ?    ?Hematologic    ?Bleeding problems:    ?Problems with blood clotting too easily:    ?    ?Skin    ?Rashes or ulcers:    ?    ?Constitutional    ?Fever or chills:    ? ?PHYSICAL EXAM:  ? ?Vitals:  ? 01/02/22 1317 01/02/22 1319  ?BP:  128/82  ?Pulse:  92  ?Resp:  18  ?Temp:  98.6 ?F (37 ?C)  ?TempSrc:  Oral  ?SpO2:  96%  ?Weight: 103 kg   ?Height: '6\' 4"'$  (1.93 m)   ? ? ?GENERAL: The patient is a well-nourished male, in no acute distress. The vital signs are documented above. ?CARDIAC: There is a regular rate and rhythm.  ?VASCULAR: Hyperpigmentation of the right lower leg with pitting edema.  No active ulcers. ?PULMONARY: Nonlabored respirations ?ABDOMEN: Soft and non-tender with normal pitched bowel sounds.  ?MUSCULOSKELETAL: There are no major deformities or cyanosis. ?NEUROLOGIC: No focal weakness or paresthesias are detected. ?SKIN: There are no ulcers or rashes noted. ?PSYCHIATRIC: The patient has a normal affect. ? ?STUDIES:  ? ?I have reviewed the following: ? ?RIGHT:  ?- Findings consistent with partial, acute deep vein thrombosis involving  ?the right common femoral vein.  ?- Common femoral vein obstruction doesn't appear to extend above inguinal  ?ligament.  ?   ?- Findings consistent with chronic deep vein thrombosis involving a single  ?duplicated right femoral vein.  ?- No cystic structure found in the popliteal fossa.  ?   ?-Prolonged reflux times noted at the distal posterior tibial vein and  ?adjoining perforator vein.  ?   ?LEFT:  ?- No evidence of common femoral vein obstruction.  ?   ? ?Assessment and Plan: ? 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  By ultrasound he appears to have a new right common femoral vein DVT which is nonocclusive.  The clot does not appear to extend above the inguinal ligament.  I suspect that this is related to him coming off of his anticoagulation, rather than a  DOAC failure.  His complaint is mainly that he has pain in his right thigh.  He does not have any appreciable change in the amount of swelling in his right leg.  I discussed with the patient that I do not think

## 2022-01-02 NOTE — ED Provider Notes (Signed)
?[name] ?Provider [name] ? ? ?CSN: [id number] ?Arrival date & time: 01/02/22  1300 ? ?  ? ?History ? ?Chief Complaint  ?Patient presents with  ? Leg Pain  ? ? ?[name] [name] is a 71 y.o. male. ? ?HPI ?Patient here for evaluation of right upper leg pain and right leg swelling, for couple weeks following back surgery, 4 weeks ago.  He held Xarelto during the perioperative period but now has restarted it.  He has been on Xarelto since 11/30/2021.  He states that earlier this week he is having more pain walking but today he is able to ambulate better, with somewhat less pain in his right upper leg with walking.  No recent trauma. ?  ? ?Home Medications ?Prior to Admission medications   ?Medication Sig Start Date End Date Taking? Authorizing Provider  ?albuterol (PROVENTIL HFA;VENTOLIN HFA) 108 (90 Base) MCG/ACT inhaler Inhale 2 puffs into the lungs every 6 (six) hours as needed for wheezing or shortness of breath. 01/27/17   Varney Biles, MD  ?atorvastatin (LIPITOR) 10 MG tablet Take 10 mg by mouth daily. 02/21/21   [provider]  ?cholestyramine Lucrezia Starch) 4 g packet Take 1 packet (4 g total) by mouth daily before lunch. 10/04/21   Levin Erp, PA  ?dicyclomine (BENTYL) 10 MG capsule Take 1 capsule (10 mg total) by mouth 4 (four) times daily -  before meals and at bedtime. 10/04/21   Levin Erp, PA  ?DULoxetine (CYMBALTA) 20 MG capsule Take 20 mg by mouth daily. 02/23/21   [provider]  ?DULoxetine (CYMBALTA) 60 MG capsule Take 120 mg by mouth daily.     [provider]  ?fluticasone (FLONASE) 50 MCG/ACT nasal spray Place 1 spray into both nostrils 2 (two) times daily. 01/25/18   [provider]  ?metoprolol tartrate (LOPRESSOR) 25 MG tablet Take 50 mg by mouth 2 (two) times daily.  07/29/14   [provider]  ?montelukast (SINGULAIR) 10 MG tablet Take 10 mg by mouth daily. 02/21/21   [provider]   ?NIFEDICAL XL 60 MG 24 hr tablet Take 120 mg by mouth daily.  02/21/14   [provider]  ?NIFEdipine (PROCARDIA XL/NIFEDICAL XL) 60 MG 24 hr tablet Take 2 tablets by mouth daily. 03/13/20   [provider]  ?ondansetron (ZOFRAN) 4 MG tablet Take 1 tablet (4 mg total) by mouth every 4 (four) hours as needed for nausea or vomiting. 10/04/21   Levin Erp, PA  ?pantoprazole (PROTONIX) 40 MG tablet Take 1 tablet (40 mg total) by mouth 2 (two) times daily. 10/04/21   Levin Erp, PA  ?rivaroxaban (XARELTO) 20 MG TABS tablet Take 1 tablet (20 mg total) by mouth daily with supper. Please follow up with Primary Care MD for future refills 10/19/19   Brunetta Genera, MD  ?VOLTAREN 1 % GEL Apply 2 g topically daily as needed (pain). For neck and shoulder pain 02/23/14   [provider]  ?   ? ?Allergies    ?Aspirin, Celecoxib, Ibuprofen, Lyrica [pregabalin], Nsaids, and Vicodin [hydrocodone-acetaminophen]   ? ?Review of Systems   ?Review of Systems ? ?Physical Exam ?Updated Vital Signs ?BP 132/80   Pulse 88   Temp 98.6 ?F (37 ?C) (Oral)   Resp 18   Ht '6\' 4"'$  (1.93 m)   Wt 103 kg   SpO2 96%   BMI 27.63 kg/m?  ?Physical Exam ?Vitals and nursing [name] reviewed.  ?Constitutional:   ?  General: He is not in acute distress. ?   Appearance: He is well-developed. He is not ill-appearing or diaphoretic.  ?HENT:  ?   Head: Normocephalic and atraumatic.  ?   Right Ear: External ear normal.  ?   Left Ear: External ear normal.  ?Eyes:  ?   Conjunctiva/sclera: Conjunctivae normal.  ?   Pupils: Pupils are equal, round, and reactive to light.  ?Neck:  ?   Trachea: Phonation normal.  ?Cardiovascular:  ?   Rate and Rhythm: Normal rate.  ?Pulmonary:  ?   Effort: Pulmonary effort is normal.  ?Abdominal:  ?   General: There is no distension.  ?   Tenderness: There is abdominal tenderness.  ?Musculoskeletal:     ?   General: Normal range of motion.  ?   Cervical back: Normal range of motion and  neck supple.  ?   Comments: He is wearing a lumbar brace.  He has mild pain in the right lumbar region with ambulation.  He walks with a slight limp, of the right leg.  Right thigh is tender to palpation with mild swelling.  Right lower leg, nontender without palpable swelling  ?Skin: ?   General: Skin is warm and dry.  ?Neurological:  ?   Mental Status: He is alert and oriented to person, place, and time.  ?   Cranial Nerves: No cranial nerve deficit.  ?   Sensory: No sensory deficit.  ?   Motor: No abnormal muscle tone.  ?   Coordination: Coordination normal.  ?Psychiatric:     ?   Mood and Affect: Mood normal.     ?   Behavior: Behavior normal.     ?   Thought Content: Thought content normal.     ?   Judgment: Judgment normal.  ? ? ?ED Results / Procedures / Treatments   ?Labs ?(all labs ordered are listed, but only abnormal results are displayed) ?Labs Reviewed  ?BASIC METABOLIC PANEL - Abnormal; Notable for the following components:  ?    Result Value  ? Potassium 3.2 (*)   ? Glucose, Bld 102 (*)   ? All other components within normal limits  ?CBC WITH DIFFERENTIAL/PLATELET - Abnormal; Notable for the following components:  ? Hemoglobin 12.6 (*)   ? MCV 72.8 (*)   ? MCH 23.5 (*)   ? RDW 16.6 (*)   ? All other components within normal limits  ? ? ?EKG ?None ? ?Radiology ?VAS [place] LOWER EXTREMITY VENOUS (DVT) (ONLY [facility] & [facility]) ? ?Result Date: 01/02/2022 ? Lower Venous DVT Study Patient Name:  [name] [name]  Date of Exam:   01/02/2022 Medical Rec #: [id number]       Accession #:    [id number] Date of Birth: [birth year]       Patient Gender: M Patient Age:   59 years Exam Location:  [facility] Procedure:      VAS [place] LOWER EXTREMITY VENOUS (DVT) Referring Phys: Debbe Mounts --------------------------------------------------------------------------------  Indications: Pain and intermittent swelling, s/p surgery L4-5 TLIF.  Anticoagulation: Xarelto - paused for surgery. Comparison Study: 06-03-2016 Prior right  lower extremity venous was positive for                   chronic DVT involving the right femoral vein and posterior                   tibial veins Performing Technologist: Darlin Coco RDMS, RVT  Examination Guidelines: A  complete evaluation includes B-mode imaging, spectral Doppler, color Doppler, and power Doppler as needed of all accessible portions of each vessel. Bilateral testing is considered an integral part of a complete examination. Limited examinations for reoccurring indications may be performed as noted. The reflux portion of the exam is performed with the patient in reverse Trendelenburg.  +---------+---------------+---------+-----------+----------+-------------------+ RIGHT    CompressibilityPhasicitySpontaneityPropertiesThrombus Aging      +---------+---------------+---------+-----------+----------+-------------------+ CFV      Partial        Yes      Yes                  Acute               +---------+---------------+---------+-----------+----------+-------------------+ SFJ      Full                                                             +---------+---------------+---------+-----------+----------+-------------------+ FV Prox  Full           Yes      Yes                                      +---------+---------------+---------+-----------+----------+-------------------+ FV Mid   Partial                                      Duplicated- one                                                           chronic, one patent +---------+---------------+---------+-----------+----------+-------------------+ FV DistalPartial                                      Duplicated- one                                                           chronic, one patent +---------+---------------+---------+-----------+----------+-------------------+ PFV      Full           Yes      Yes                                       +---------+---------------+---------+-----------+----------+-------------------+ POP      Full           Yes      Yes                                      +---------+---------------+---------+-----------+----------+-------------------+ PTV      Full                                                             +----

## 2022-01-02 NOTE — ED Notes (Signed)
Pt was informed that he was up for discharge and this RN was waiting on compression stockings to apply before pt left. Pt agreed to wait for stockings. RN went to update pt and pt had already left. MD made aware ?

## 2022-01-02 NOTE — ED Triage Notes (Signed)
Patient recently had back surgery has a hx of blood clots and is having right leg pain and swelling and PCP told him to come in and be check for DVT Denies CP SOB ?
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
## 2022-01-29 ENCOUNTER — Other Ambulatory Visit: Payer: Self-pay | Admitting: Orthopaedic Surgery

## 2022-01-29 DIAGNOSIS — M4326 Fusion of spine, lumbar region: Secondary | ICD-10-CM

## 2022-01-29 DIAGNOSIS — R2241 Localized swelling, mass and lump, right lower limb: Secondary | ICD-10-CM

## 2022-01-30 ENCOUNTER — Ambulatory Visit
Admission: RE | Admit: 2022-01-30 | Discharge: 2022-01-30 | Disposition: A | Discharge status: Home / Self Care | Payer: Medicare Other | Source: Ambulatory Visit | Attending: Orthopaedic Surgery | Admitting: Orthopaedic Surgery

## 2022-01-30 ENCOUNTER — Other Ambulatory Visit: Payer: Self-pay | Admitting: Orthopaedic Surgery

## 2022-01-30 DIAGNOSIS — M4326 Fusion of spine, lumbar region: Secondary | ICD-10-CM

## 2022-01-30 DIAGNOSIS — R2241 Localized swelling, mass and lump, right lower limb: Secondary | ICD-10-CM

## 2022-02-22 ENCOUNTER — Other Ambulatory Visit: Payer: Self-pay | Admitting: Family

## 2022-02-22 DIAGNOSIS — F17210 Nicotine dependence, cigarettes, uncomplicated: Secondary | ICD-10-CM

## 2022-03-21 ENCOUNTER — Ambulatory Visit
Admission: RE | Admit: 2022-03-21 | Discharge: 2022-03-21 | Disposition: A | Discharge status: Home / Self Care | Payer: Medicare Other | Source: Ambulatory Visit | Attending: Family | Admitting: Family

## 2022-03-21 DIAGNOSIS — F17210 Nicotine dependence, cigarettes, uncomplicated: Secondary | ICD-10-CM

## 2022-04-12 ENCOUNTER — Other Ambulatory Visit: Payer: Self-pay | Admitting: Orthopaedic Surgery

## 2022-04-12 DIAGNOSIS — M5416 Radiculopathy, lumbar region: Secondary | ICD-10-CM

## 2022-04-12 DIAGNOSIS — M4326 Fusion of spine, lumbar region: Secondary | ICD-10-CM

## 2022-04-20 ENCOUNTER — Other Ambulatory Visit: Payer: Medicare Other

## 2022-05-04 ENCOUNTER — Other Ambulatory Visit: Payer: Medicare Other

## 2022-05-20 ENCOUNTER — Ambulatory Visit
Admission: RE | Admit: 2022-05-20 | Discharge: 2022-05-20 | Disposition: A | Discharge status: Home / Self Care | Payer: Medicare Other | Source: Ambulatory Visit | Attending: Orthopaedic Surgery | Admitting: Orthopaedic Surgery

## 2022-05-20 DIAGNOSIS — M5416 Radiculopathy, lumbar region: Secondary | ICD-10-CM

## 2022-05-20 DIAGNOSIS — M4326 Fusion of spine, lumbar region: Secondary | ICD-10-CM

## 2022-05-24 ENCOUNTER — Ambulatory Visit: Payer: Medicare Other | Admitting: Orthopaedic Surgery

## 2022-05-31 ENCOUNTER — Ambulatory Visit (INDEPENDENT_AMBULATORY_CARE_PROVIDER_SITE_OTHER): Payer: Medicare Other | Admitting: Orthopaedic Surgery

## 2022-05-31 ENCOUNTER — Encounter: Payer: Self-pay | Admitting: Orthopaedic Surgery

## 2022-05-31 DIAGNOSIS — M12811 Other specific arthropathies, not elsewhere classified, right shoulder: Secondary | ICD-10-CM

## 2022-05-31 NOTE — Progress Notes (Signed)
Office Visit Note   Patient: [name] [name]           Date of Birth: March 29, 1951           MRN: [id number] Visit Date: 05/31/2022              Requested by: No referring provider defined for this encounter. PCP: Pcp, No   Assessment & Plan: Visit Diagnoses:  1. Rotator cuff arthropathy of right shoulder     Plan: Impression is right shoulder rotator cuff arthropathy.  Based on previous results from intra-articular cortisone injection we have discussed repeat injection today for which she would like to proceed.  We have referred him to Dr. Rolena Infante to have this performed under ultrasound.  He will follow-up as needed.  Call with concerns or questions.  Follow-Up Instructions: Return if symptoms worsen or fail to improve.   Orders:  No orders of the defined types were placed in this encounter.  No orders of the defined types were placed in this encounter.     Procedures: No procedures performed   Clinical Data: No additional findings.   Subjective: Chief Complaint  Patient presents with   Right Shoulder - Pain    HPI patient is a pleasant 71 year old gentleman who comes in today with recurrent right shoulder pain.  History of right shoulder rotator cuff arthropathy.  He was seen in our office in June 2022 where he was referred to Dr. Junius Roads for intra-articular cortisone injection.  He notes significant improvement of symptoms which lasted about 6 to 7 months.  His symptoms have gradually returned.  Pain he has is to the entire right shoulder and is constant.  Any movement of the shoulder worsens his symptoms.  He is on chronic Percocet and has been using Salonpas without much relief.  Review of Systems as detailed in HPI.  All others reviewed are negative.   Objective: Vital Signs: There were no vitals taken for this visit.  Physical Exam well-developed well-nourished gentleman in no acute distress.  Alert and oriented x3.  Ortho Exam right shoulder exam reveals forward  flexion to approximately 120 degrees.  External rotation 45 degrees internal rotation to his back pocket.  He is neurovascular intact distally.  Specialty Comments:  No specialty comments available.  Imaging: No new imaging   PMFS History: Patient Active Problem List   Diagnosis Date Noted   Right flank pain 05/01/2021   Chronic right-sided back pain 05/01/2021   Chronic diarrhea 05/01/2021   Primary osteoarthritis of left knee 03/29/2020   Primary osteoarthritis of right knee 07/08/2017   Complete rotator cuff tear of left shoulder 10/21/2016   Gout attack 04/02/2016   Angina pectoris ([place]) 08/01/2014   Hemorrhage of rectum and anus 03/28/2014   Neuropathy, peripheral 05/15/2012   Pulmonary embolism ([place]) 07/11/2011   DVT (deep venous thrombosis) ([place]) 07/11/2011   Abdominal pain, left lower quadrant 01/29/2011   Long term current use of anticoagulant therapy 01/29/2011   Diarrhea 01/29/2011   Past Medical History:  Diagnosis Date   Anxiety and depression    Back pain    Diverticulitis    Diverticulosis    DVT (deep venous thrombosis) ([facility])    Gout    Hernia    History of blood clots    Hypertension    Nausea and vomiting    Neuropathy, peripheral 05/15/2012   patient denies   Shingles     Family History  Problem Relation Age of Onset  Prostate cancer Father    Heart disease Father    Diabetes Mother    Heart disease Mother    Colon cancer Maternal Uncle        dx in his 70's   Pancreatic cancer Paternal Uncle    Prostate cancer Paternal Uncle    Multiple sclerosis Daughter    Prostate cancer Paternal Uncle    Esophageal cancer Neg Hx    Rectal cancer Neg Hx    Stomach cancer Neg Hx     Past Surgical History:  Procedure Laterality Date   APPENDECTOMY     BACK SURGERY     CHOLECYSTECTOMY  2008   HERNIA REPAIR     HIATAL HERNIA REPAIR     IR ANGIO INTRA EXTRACRAN SEL INTERNAL CAROTID BILAT MOD SED  01/18/2020   IR ANGIO VERTEBRAL SEL VERTEBRAL BILAT  MOD SED  01/18/2020   IR US GUIDE VASC ACCESS RIGHT  01/18/2020   LEFT HEART CATHETERIZATION WITH CORONARY ANGIOGRAM N/A 08/02/2014   Procedure: LEFT HEART CATHETERIZATION WITH CORONARY ANGIOGRAM;  Surgeon: Laverda Page, MD;  Location: [facility] CATH LAB;  Service: Cardiovascular;  Laterality: N/A;   MULTIPLE EXTRACTIONS WITH ALVEOLOPLASTY N/A 10/19/2015   Procedure: Extraction of tooth #'s 2,12,13 with alveoloplasty;  Surgeon: Lenn Cal, DDS;  Location: [place];  Service: Oral Surgery;  Laterality: N/A;   SACRAL NERVE STIMULATOR PLACEMENT  2011   Social History   Occupational History   Occupation: unemployed    Fish farm manager: OTHER    Employer: RETIRED  Tobacco Use   Smoking status: Every Day    Packs/day: 0.50    Years: 40.00    Total pack years: 20.00    Types: Cigarettes   Smokeless tobacco: Never  Vaping Use   Vaping Use: Never used  Substance and Sexual Activity   Alcohol use: Yes    Alcohol/week: 0.0 standard drinks of alcohol    Comment: Occasional   Drug use: Yes    Types: Marijuana    Comment: for nausea   Sexual activity: Not on file

## 2022-06-03 ENCOUNTER — Encounter: Payer: Self-pay | Admitting: Sports Medicine

## 2022-06-03 ENCOUNTER — Ambulatory Visit (INDEPENDENT_AMBULATORY_CARE_PROVIDER_SITE_OTHER): Payer: Medicare Other | Admitting: Sports Medicine

## 2022-06-03 ENCOUNTER — Ambulatory Visit: Payer: Self-pay

## 2022-06-03 VITALS — BP 135/76 | HR 101

## 2022-06-03 DIAGNOSIS — G8929 Other chronic pain: Secondary | ICD-10-CM | POA: Diagnosis not present

## 2022-06-03 DIAGNOSIS — M12811 Other specific arthropathies, not elsewhere classified, right shoulder: Secondary | ICD-10-CM

## 2022-06-03 DIAGNOSIS — M25511 Pain in right shoulder: Secondary | ICD-10-CM

## 2022-06-03 MED ORDER — METHYLPREDNISOLONE ACETATE 40 MG/ML IJ SUSP: 80.0000 mg | Freq: Once | INTRAMUSCULAR | Status: DC

## 2022-06-03 NOTE — Progress Notes (Signed)
   Procedure Note  Patient: [name] [name]             Date of Birth: 06-Dec-1950           MRN: [id number]             Visit Date: 06/03/2022  Procedures: Visit Diagnoses:  1. Rotator cuff arthropathy of right shoulder    US-guided glenohumeral joint injection, right shoulder After discussion on risks/benefits/indications, informed verbal consent was obtained. A timeout was then performed. The patient was positioned lying lateral recumbent on examination table. The patient's shoulder was prepped with betadine and multiple alcohol swabs and utilizing ultrasound guidance, the patient's glenohumeral joint was identified on ultrasound. Using ultrasound guidance a 22-gauge, 3.5 inch needle with a mixture of 2:2:2 cc's lidocaine:bupivicaine:depomedrol was directed from a lateral to medial direction via in-plane technique into the glenohumeral joint with visualization of appropriate spread of injectate into the joint. Patient tolerated the procedure well without immediate complications.   - I evaluated the patient about 10 minutes post-injection and they had improvement in pain and range of motion - follow-up with Dr. Erlinda Hong as indicated; I am happy to see them as needed -I did provide him and go through rotator cuff exercises with the band with him today.  He is to do this once daily -Hopefully this provides some benefit, however if it does not, recommend he follow-up in 3 weeks and we can consider subacromial joint injection  Elba Barman, DO [place]  This note was dictated using Dragon naturally speaking software and may contain errors in syntax, spelling, or content which have not been identified prior to signing this note.

## 2022-06-03 NOTE — Addendum Note (Signed)
Addended by: Otelia Sergeant on: 06/03/2022 03:08 PM   Modules accepted: Orders

## 2022-06-24 ENCOUNTER — Other Ambulatory Visit: Payer: Self-pay | Admitting: Family

## 2022-06-24 DIAGNOSIS — D6869 Other thrombophilia: Secondary | ICD-10-CM

## 2022-06-24 DIAGNOSIS — I82501 Chronic embolism and thrombosis of unspecified deep veins of right lower extremity: Secondary | ICD-10-CM

## 2022-06-28 ENCOUNTER — Ambulatory Visit
Admission: RE | Admit: 2022-06-28 | Discharge: 2022-06-28 | Disposition: A | Discharge status: Home / Self Care | Payer: Medicare Other | Source: Ambulatory Visit | Attending: Family | Admitting: Family

## 2022-06-28 DIAGNOSIS — I82501 Chronic embolism and thrombosis of unspecified deep veins of right lower extremity: Secondary | ICD-10-CM

## 2022-06-28 DIAGNOSIS — D6869 Other thrombophilia: Secondary | ICD-10-CM

## 2022-07-01 ENCOUNTER — Ambulatory Visit
Admission: RE | Admit: 2022-07-01 | Discharge: 2022-07-01 | Disposition: A | Discharge status: Home / Self Care | Payer: Medicare Other | Source: Ambulatory Visit | Attending: Family | Admitting: Family

## 2022-07-01 DIAGNOSIS — I82501 Chronic embolism and thrombosis of unspecified deep veins of right lower extremity: Secondary | ICD-10-CM

## 2022-07-01 DIAGNOSIS — D6869 Other thrombophilia: Secondary | ICD-10-CM

## 2022-07-02 ENCOUNTER — Encounter: Payer: Self-pay | Admitting: Gastroenterology

## 2022-07-02 ENCOUNTER — Other Ambulatory Visit (HOSPITAL_BASED_OUTPATIENT_CLINIC_OR_DEPARTMENT_OTHER): Payer: Self-pay | Admitting: Family

## 2022-07-02 DIAGNOSIS — N289 Disorder of kidney and ureter, unspecified: Secondary | ICD-10-CM

## 2022-07-25 ENCOUNTER — Ambulatory Visit (HOSPITAL_BASED_OUTPATIENT_CLINIC_OR_DEPARTMENT_OTHER): Payer: Medicare Other | Attending: Family

## 2022-08-20 ENCOUNTER — Encounter (HOSPITAL_BASED_OUTPATIENT_CLINIC_OR_DEPARTMENT_OTHER): Payer: Self-pay

## 2022-08-22 ENCOUNTER — Ambulatory Visit (HOSPITAL_BASED_OUTPATIENT_CLINIC_OR_DEPARTMENT_OTHER): Admission: RE | Admit: 2022-08-22 | Payer: Medicare Other | Source: Ambulatory Visit

## 2022-11-12 ENCOUNTER — Encounter: Payer: Self-pay | Admitting: Physician Assistant

## 2022-11-12 ENCOUNTER — Ambulatory Visit (INDEPENDENT_AMBULATORY_CARE_PROVIDER_SITE_OTHER): Payer: 59 | Admitting: Physician Assistant

## 2022-11-12 ENCOUNTER — Ambulatory Visit (INDEPENDENT_AMBULATORY_CARE_PROVIDER_SITE_OTHER)
Admission: RE | Admit: 2022-11-12 | Discharge: 2022-11-12 | Disposition: A | Discharge status: Home / Self Care | Payer: 59 | Source: Ambulatory Visit | Attending: Physician Assistant | Admitting: Physician Assistant

## 2022-11-12 ENCOUNTER — Other Ambulatory Visit (INDEPENDENT_AMBULATORY_CARE_PROVIDER_SITE_OTHER): Payer: 59

## 2022-11-12 VITALS — BP 130/82 | HR 104 | Ht 75.0 in | Wt 197.0 lb

## 2022-11-12 DIAGNOSIS — R634 Abnormal weight loss: Secondary | ICD-10-CM | POA: Diagnosis not present

## 2022-11-12 DIAGNOSIS — R109 Unspecified abdominal pain: Secondary | ICD-10-CM

## 2022-11-12 DIAGNOSIS — R110 Nausea: Secondary | ICD-10-CM | POA: Diagnosis not present

## 2022-11-12 DIAGNOSIS — I2782 Chronic pulmonary embolism: Secondary | ICD-10-CM

## 2022-11-12 DIAGNOSIS — R1030 Lower abdominal pain, unspecified: Secondary | ICD-10-CM

## 2022-11-12 LAB — CORTISOL: Cortisol, Plasma: 11.9 ug/dL

## 2022-11-12 LAB — COMPREHENSIVE METABOLIC PANEL
ALT: 10 U/L (ref 0–53)
AST: 13 U/L (ref 0–37)
Albumin: 4.2 g/dL (ref 3.5–5.2)
Alkaline Phosphatase: 58 U/L (ref 39–117)
BUN: 11 mg/dL (ref 6–23)
CO2: 27 mEq/L (ref 19–32)
Calcium: 9.7 mg/dL (ref 8.4–10.5)
Chloride: 103 mEq/L (ref 96–112)
Creatinine, Ser: 1.07 mg/dL (ref 0.40–1.50)
GFR: 69.31 mL/min (ref >60.00)
Glucose, Bld: 95 mg/dL (ref 70–99)
Potassium: 3.6 mEq/L (ref 3.5–5.1)
Sodium: 139 mEq/L (ref 135–145)
Total Bilirubin: 0.6 mg/dL (ref 0.2–1.2)
Total Protein: 6.9 g/dL (ref 6.0–8.3)

## 2022-11-12 LAB — CBC WITH DIFFERENTIAL/PLATELET
Basophils Absolute: 0 10*3/uL (ref 0.0–0.1)
Basophils Relative: 0.9 % (ref 0.0–3.0)
Eosinophils Absolute: 0 10*3/uL (ref 0.0–0.7)
Eosinophils Relative: 0.7 % (ref 0.0–5.0)
HCT: 42.1 % (ref 39.0–52.0)
Hemoglobin: 13.6 g/dL (ref 13.0–17.0)
Lymphocytes Relative: 31.3 % (ref 12.0–46.0)
Lymphs Abs: 1.4 10*3/uL (ref 0.7–4.0)
MCHC: 32.4 g/dL (ref 30.0–36.0)
MCV: 72.5 fl — ABNORMAL LOW (ref 78.0–100.0)
Monocytes Absolute: 0.4 10*3/uL (ref 0.1–1.0)
Monocytes Relative: 8.2 % (ref 3.0–12.0)
Neutro Abs: 2.7 10*3/uL (ref 1.4–7.7)
Neutrophils Relative %: 58.9 % (ref 43.0–77.0)
Platelets: 316 10*3/uL (ref 150.0–400.0)
RBC: 5.8 Mil/uL (ref 4.22–5.81)
RDW: 15 % (ref 11.5–15.5)
WBC: 4.6 10*3/uL (ref 4.0–10.5)

## 2022-11-12 LAB — SEDIMENTATION RATE: Sed Rate: 15 mm/hr (ref 0–20)

## 2022-11-12 LAB — TSH: TSH: 0.95 u[IU]/mL (ref 0.35–5.50)

## 2022-11-12 NOTE — Patient Instructions (Addendum)
Your provider has requested that you have an abdominal x ray before leaving today. Please go to the basement floor to our Radiology department for the test.  Your provider has requested that you go to the basement level for lab work before leaving today. Press "B" on the elevator. The lab is located at the first door on the left as you exit the elevator.  You have been scheduled for a CT scan of the abdomen and pelvis at [facility], 1st floor Radiology. You are scheduled on 11/20/2022 at 3:30pm. You should arrive at 1:15pm   Please follow the written instructions below on the day of your exam:   1) Do not eat anything after 11:30am (4 hours prior to your test)   You may take any medications as prescribed with a small amount of water, if necessary. If you take any of the following medications: METFORMIN, GLUCOPHAGE, GLUCOVANCE, AVANDAMET, RIOMET, FORTAMET, [place] MET, JANUMET, GLUMETZA or METAGLIP, you MAY be asked to HOLD this medication 48 hours AFTER the exam.   The purpose of you drinking the oral contrast is to aid in the visualization of your intestinal tract. The contrast solution may cause some diarrhea. Depending on your individual set of symptoms, you may also receive an intravenous injection of x-ray contrast/dye. Plan on being at [facility] for 45 minutes or longer, depending on the type of exam you are having performed.   If you have any questions regarding your exam or if you need to reschedule, you may call [facility] at [phone] between the hours of 8:00 am and 5:00 pm, Monday-Friday.      Gastroparesis Please do small frequent meals like 4-6 meals a day.  Eat and drink liquids at separate times.  Avoid high fiber foods, cook your vegetables, avoid high fat food.  Suggest spreading protein throughout the day (greek yogurt, glucerna, soft meat, milk, eggs) Choose soft foods that you can mash with a fork When you are more symptomatic, change to pureed  foods foods and liquids.  Consider reading "Living well with Gastroparesis" by Lambert Keto Gastroparesis is a condition in which food takes longer than normal to empty from the stomach. This condition is also known as delayed gastric emptying. It is usually a long-term (chronic) condition. There is no cure, but there are treatments and things that you can do at home to help relieve symptoms. Treating the underlying condition that causes gastroparesis can also help relieve symptoms What are the causes? In many cases, the cause of this condition is not known. Possible causes include: A hormone (endocrine) disorder, such as hypothyroidism or diabetes. A nervous system disease, such as Parkinson's disease or multiple sclerosis. Cancer, infection, or surgery that affects the stomach or vagus nerve. The vagus nerve runs from your chest, through your neck, and to the lower part of your brain. A connective tissue disorder, such as scleroderma. Certain medicines. What increases the risk? You are more likely to develop this condition if: You have certain disorders or diseases. These may include: An endocrine disorder. An eating disorder. Amyloidosis. Scleroderma. Parkinson's disease. Multiple sclerosis. Cancer or infection of the stomach or the vagus nerve. You have had surgery on your stomach or vagus nerve. You take certain medicines. You are male. What are the signs or symptoms? Symptoms of this condition include: Feeling full after eating very little or a loss of appetite. Nausea, vomiting, or heartburn. Bloating of your abdomen. Inconsistent blood sugar (glucose) levels on blood tests. Unexplained  weight loss. Acid from the stomach coming up into the esophagus (gastroesophageal reflux). Sudden tightening (spasm) of the stomach, which can be painful. Symptoms may come and go. Some people may not notice any symptoms. How is this diagnosed? This condition is diagnosed with tests,  such as: Tests that check how long it takes food to move through the stomach and intestines. These tests include: Upper gastrointestinal (GI) series. For this test, you drink a liquid that shows up well on X-rays, and then X-rays are taken of your intestines. Gastric emptying scintigraphy. For this test, you eat food that contains a small amount of radioactive material, and then scans are taken. Wireless capsule GI monitoring system. For this test, you swallow a pill (capsule) that records information about how foods and fluid move through your stomach. Gastric manometry. For this test, a tube is passed down your throat and into your stomach to measure electrical and muscular activity. Endoscopy. For this test, a long, thin tube with a camera and light on the end is passed down your throat and into your stomach to check for problems in your stomach lining. Ultrasound. This test uses sound waves to create images of the inside of your body. This can help rule out gallbladder disease or pancreatitis as a cause of your symptoms. How is this treated? There is no cure for this condition, but treatment and home care may relieve symptoms. Treatment may include: Treating the underlying cause. Managing your symptoms by making changes to your diet and exercise habits. Taking medicines to control nausea and vomiting and to stimulate stomach muscles. Getting food through a feeding tube in the hospital. This may be done in severe cases. Having surgery to insert a device called a gastric electrical stimulator into your body. This device helps improve stomach emptying and control nausea and vomiting. Follow these instructions at home: Take over-the-counter and prescription medicines only as told by your health care provider. Follow instructions from your health care provider about eating or drinking restrictions. Your health care provider may recommend that you: Eat smaller meals more often. Eat low-fat  foods. Eat low-fiber forms of high-fiber foods. For example, eat cooked vegetables instead of raw vegetables. Have only liquid foods instead of solid foods. Liquid foods are easier to digest. Drink enough fluid to keep your urine pale yellow. Exercise as often as told by your health care provider. Keep all follow-up visits. This is important. Contact a health care provider if you: Notice that your symptoms do not improve with treatment. Have new symptoms. Get help right away if you: Have severe pain in your abdomen that does not improve with treatment. Have nausea that is severe or does not go away. Vomit every time you drink fluids. Summary Gastroparesis is a long-term (chronic) condition in which food takes longer than normal to empty from the stomach. Symptoms include nausea, vomiting, heartburn, bloating of your abdomen, and loss of appetite. Eating smaller portions, low-fat foods, and low-fiber forms of high-fiber foods may help you manage your symptoms. Get help right away if you have severe pain in your abdomen. This information is not intended to replace advice given to you by your health care provider. Make sure you discuss any questions you have with your health care provider. Document Revised: 01/03/2020 Document Reviewed: 01/03/2020 Elsevier Patient Education  2021 [employer].

## 2022-11-12 NOTE — Progress Notes (Unsigned)
11/12/2022 [name] [name] [id number] 03-28-1951  Referring provider: No ref. provider found Primary GI doctor: Dr. Loletha Carrow  ASSESSMENT AND PLAN:   AB pain with mixed constipation/diarrhea, nausea without vomiting Normal colon 2020, negative microscopic colitis Patient is on oxycodone and questran, only BM once or twice a week with mucus/loose stools. I'm suspicious this is more constipation with overflow diarrhea/fecal impaction.  Will get Xray to evaluate, will consider stopping questran and adding linzess for Ab pain.  Advised to stop marijuana Try lidocaine patches  Weight loss Check Xray, has had increase mucus Check labs including TSH, cortisol, cortisol and inflammatory labs Check CT AB and pelvis with contrast Last colon was 2020, has never had EGD, no dysphagia.  If CT negative, can schedule for EGD with or without colonoscopy, will discuss with Dr. Loletha Carrow, close follow up.  Had DVT off xarelto for days for back surgery, would possibly need DVT bridge-  Consider GES ? Need to adjust cymbalta since high dose, or add on remeron, can be contributing to symptoms.   Had previous aldolase and CPK elevated in 11/2019, may want to consider repeating this with PCP/ortho  Other chronic pulmonary embolism without acute cor pulmonale (HCC) On xarelto Recent back surgery last year with holding of xarelto with subsquent DVT while off medication If he needs endoscopic evaluation, consider bridge with lovenox, would get permission from PCP/hematology.    Patient Care Team: Pcp, No as PCP - General  HISTORY OF PRESENT ILLNESS: 72 y.o. male with a past medical history of DVT on Xarelto (recurrent off xarelto for back surgery 2023), status post cholecystectomy, back pain and others listed below presents for evaluation of AB pain.   October 2020 colonoscopy negative for microscopic colitis.  2022 CT abdomen pelvis with contrast for abdominal discomfort, diarrhea and right-sided  abdominal pain showed no intra-abdominal source.  Discussed likely radicular pain. 10/04/2021 office visit Ellouise Newer for abdominal pain pantoprazole 40 mg twice daily, dicyclomine, Colesytramine tried.    Here for follow-up 4 to 6 weeks discussed repeat EGD colonoscopy. 11/27/2021 surgery with Dr. Louann Sjogren at [facility] for removal of spinal stimulator, L4-L5 TLIF and L3-L4 laminectomy Xarelto was held for perioperative period without a bridge.  01/02/2022 ER with right upper leg and right leg swelling.  Found to have new right common femoral vein DVT likely developed while DOAC was held.  He states he has constant left lower AB pain that can radiate into his groin and radiate across.  Was occurring every day for a while. Pain is worse with veggies.  He has constipation and diarrhea, will go back and forth, can have BM every few days but can take up to a week without BM, can have mucus in the stool, can have diarrhea 3-4 x a week.  He is on [place] once a day and stool softner. Marland Kitchen  He can only chew on one side of his mouth, he has no teeth on left side.  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  He has nausea, worse with certain smells. Can have vomiting with it. Can happen twice a month, no bloody emesis, appears to be more mucus.  He does have fullness.  He has lost about 30 lbs in a year without trying.  CT chest negative 03/21/2022, normal PSA.  Has had night sweats for about a year.   States he is also stressed, he is  on Cymbalta '120mg'$  daily  No NSAIDS, rare wine.  He does smoke marijuana daily for his appetite, has done this for 2 years.    He  reports that he has been smoking cigarettes. He has a 20.00 pack-year smoking history. He has never used smokeless tobacco. He reports current alcohol use. He reports current drug use. Drug: Marijuana.  Wt Readings from Last 4 Encounters:  11/12/22 197 lb (89.4 kg)  01/02/22 227 lb  (103 kg)  10/04/21 227 lb 12.8 oz (103.3 kg)  05/01/21 231 lb 8 oz (105 kg)     RELEVANT LABS AND IMAGING: CBC    Component Value Date/Time   WBC 5.2 01/02/2022 1339   RBC 5.36 01/02/2022 1339   HGB 12.6 (L) 01/02/2022 1339   HGB 13.2 03/08/2020 1117   HGB 13.5 09/17/2018 1057   HGB 13.1 04/04/2014 1110   HCT 39.0 01/02/2022 1339   HCT 42.2 09/17/2018 1057   HCT 41.7 04/04/2014 1110   PLT PLATELET CLUMPS NOTED ON SMEAR, UNABLE TO ESTIMATE 01/02/2022 1339   PLT 272 03/08/2020 1117   PLT 273 09/17/2018 1057   MCV 72.8 (L) 01/02/2022 1339   MCV 73 (L) 09/17/2018 1057   MCV 72.4 (L) 04/04/2014 1110   MCH 23.5 (L) 01/02/2022 1339   MCHC 32.3 01/02/2022 1339   RDW 16.6 (H) 01/02/2022 1339   RDW 15.4 09/17/2018 1057   RDW 16.5 (H) 04/04/2014 1110   LYMPHSABS 1.6 01/02/2022 1339   LYMPHSABS 1.2 09/17/2018 1057   LYMPHSABS 1.5 04/04/2014 1110   MONOABS 0.4 01/02/2022 1339   MONOABS 0.5 04/04/2014 1110   EOSABS 0.1 01/02/2022 1339   EOSABS 0.1 09/17/2018 1057   BASOSABS 0.0 01/02/2022 1339   BASOSABS 0.0 09/17/2018 1057   BASOSABS 0.1 04/04/2014 1110   Recent Labs    01/02/22 1339  HGB 12.6*     CMP     Component Value Date/Time   NA 136 01/02/2022 1339   NA 143 09/17/2018 1057   NA 143 04/04/2014 1116   K 3.2 (L) 01/02/2022 1339   K 3.7 04/04/2014 1116   CL 100 01/02/2022 1339   CO2 26 01/02/2022 1339   CO2 30 (H) 04/04/2014 1116   GLUCOSE 102 (H) 01/02/2022 1339   GLUCOSE 89 04/04/2014 1116   BUN 9 01/02/2022 1339   BUN 12 09/17/2018 1057   BUN 10.1 04/04/2014 1116   CREATININE 0.78 01/02/2022 1339   CREATININE 1.12 03/08/2020 1117   CREATININE 1.0 04/04/2014 1116   CALCIUM 9.2 01/02/2022 1339   CALCIUM 9.3 04/04/2014 1116   PROT 6.4 (L) 04/25/2021 2116   PROT 6.3 03/17/2017 1134   PROT 6.6 04/04/2014 1116   ALBUMIN 3.8 04/25/2021 2116   ALBUMIN 4.1 03/17/2017 1134   ALBUMIN 3.8 04/04/2014 1116   AST 22 04/25/2021 2116   AST 15 03/08/2020 1117    AST 14 04/04/2014 1116   ALT 16 04/25/2021 2116   ALT 32 03/08/2020 1117   ALT 10 04/04/2014 1116   ALKPHOS 46 04/25/2021 2116   ALKPHOS 45 04/04/2014 1116   BILITOT 0.7 04/25/2021 2116   BILITOT 0.3 03/08/2020 1117   BILITOT 0.48 04/04/2014 1116   GFRNONAA >60 01/02/2022 1339   GFRNONAA >60 03/08/2020 1117   GFRAA >60 03/08/2020 1117      Latest Ref Rng & Units 04/25/2021    9:16 PM 02/13/2021   10:00 AM 03/08/2020   11:17 AM  Hepatic Function  Total Protein 6.5 - 8.1 g/dL 6.4  6.2  6.7   Albumin 3.5 - 5.0 g/dL 3.8  3.9  3.9   AST 15 - 41 U/L '22  18  15   '$ ALT 0 - 44 U/L 16  16  32   Alk Phosphatase 38 - 126 U/L 46  37  60   Total Bilirubin 0.3 - 1.2 mg/dL 0.7  0.8  0.3       Current Medications:    Current Outpatient Medications (Cardiovascular):    atorvastatin (LIPITOR) 10 MG tablet, Take 10 mg by mouth daily.   cholestyramine (QUESTRAN) 4 g packet, Take 1 packet (4 g total) by mouth daily before lunch.   metoprolol tartrate (LOPRESSOR) 25 MG tablet, Take 50 mg by mouth 2 (two) times daily.    NIFEDICAL XL 60 MG 24 hr tablet, Take 120 mg by mouth daily.    NIFEdipine (PROCARDIA XL/NIFEDICAL XL) 60 MG 24 hr tablet, Take 2 tablets by mouth daily.  Current Outpatient Medications (Respiratory):    albuterol (PROVENTIL HFA;VENTOLIN HFA) 108 (90 Base) MCG/ACT inhaler, Inhale 2 puffs into the lungs every 6 (six) hours as needed for wheezing or shortness of breath.   fluticasone (FLONASE) 50 MCG/ACT nasal spray, Place 1 spray into both nostrils 2 (two) times daily.   montelukast (SINGULAIR) 10 MG tablet, Take 10 mg by mouth daily.   Current Outpatient Medications (Hematological):    rivaroxaban (XARELTO) 20 MG TABS tablet, Take 1 tablet (20 mg total) by mouth daily with supper. Please follow up with Primary Care MD for future refills  Current Outpatient Medications (Other):    dicyclomine (BENTYL) 10 MG capsule, Take 1 capsule (10 mg total) by mouth 4 (four) times daily -   before meals and at bedtime.   DULoxetine (CYMBALTA) 20 MG capsule, Take 20 mg by mouth daily.   DULoxetine (CYMBALTA) 60 MG capsule, Take 120 mg by mouth daily.    ondansetron (ZOFRAN) 4 MG tablet, Take 1 tablet (4 mg total) by mouth every 4 (four) hours as needed for nausea or vomiting.   pantoprazole (PROTONIX) 40 MG tablet, Take 1 tablet (40 mg total) by mouth 2 (two) times daily.   VOLTAREN 1 % GEL, Apply 2 g topically daily as needed (pain). For neck and shoulder pain   Medical History:  Past Medical History:  Diagnosis Date   Anxiety and depression    Back pain    Diverticulitis    Diverticulosis    DVT (deep venous thrombosis) (HCC)    Gout    Hernia    History of blood clots    Hypertension    Nausea and vomiting    Neuropathy, peripheral 05/15/2012   patient denies   Shingles    Allergies:  Allergies  Allergen Reactions   Aspirin Other (See Comments)    ulcers   Celecoxib Other (See Comments)    Stomach irritation   Ibuprofen Other (See Comments)    ulcers   Lyrica [Pregabalin] Swelling   Nsaids    Vicodin [Hydrocodone-Acetaminophen]     Makes me mean      Surgical History:  He  has a past surgical history that includes Back surgery; Hernia repair; Hiatal hernia repair; Appendectomy; Cholecystectomy (09/09/2006); Sacral nerve stimulator placement (09/09/2009); left heart catheterization with coronary angiogram (N/A, 08/02/2014); Multiple extractions with alveoloplasty (N/A, 10/19/2015); IR ANGIO INTRA EXTRACRAN SEL INTERNAL CAROTID BILAT MOD SED (01/18/2020); IR US Guide Vasc Access Right (01/18/2020); and IR ANGIO VERTEBRAL SEL VERTEBRAL BILAT MOD SED (01/18/2020). Family History:  His family history includes Colon cancer in his maternal uncle; Diabetes in his mother; Heart disease in his father and mother; Multiple sclerosis in his daughter; Pancreatic cancer in his paternal uncle; Prostate cancer in his father, paternal uncle, and paternal uncle.  REVIEW OF  SYSTEMS  : All other systems reviewed and negative except where noted in the History of Present Illness.  PHYSICAL EXAM: BP 130/82   Pulse (!) 104   Ht '6\' 3"'$  (1.905 m)   Wt 197 lb (89.4 kg)   BMI 24.62 kg/m  General Appearance: Well nourished, in no apparent distress. Head:   Normocephalic and atraumatic. Eyes:  sclerae anicteric,conjunctive pink  Respiratory: Respiratory effort normal, BS equal bilaterally without rales, rhonchi, wheezing. Cardio: RRR with no MRGs. Peripheral pulses intact.  Abdomen: Soft,  Obese ,active bowel sounds. mild tenderness in the LLQ. With guarding and Without rebound. No masses. Rectal: Not evaluated Musculoskeletal: Full ROM, Antalgic gait. With edema. Skin:  Dry and intact without significant lesions or rashes Neuro: Alert and  oriented x4;  No focal deficits. Psych:  Cooperative. Normal mood and affect.    Vladimir Crofts, PA-C 3:50 PM

## 2022-11-13 ENCOUNTER — Other Ambulatory Visit: Payer: Self-pay

## 2022-11-13 ENCOUNTER — Other Ambulatory Visit (INDEPENDENT_AMBULATORY_CARE_PROVIDER_SITE_OTHER): Payer: 59

## 2022-11-13 DIAGNOSIS — R718 Other abnormality of red blood cells: Secondary | ICD-10-CM

## 2022-11-13 LAB — IRON: Iron: 125 ug/dL (ref 42–165)

## 2022-11-13 LAB — FERRITIN: Ferritin: 40.6 ng/mL (ref 22.0–322.0)

## 2022-11-13 LAB — HIV ANTIBODY (ROUTINE TESTING W REFLEX): HIV 1&2 Ab, 4th Generation: NONREACTIVE

## 2022-11-13 NOTE — Progress Notes (Signed)
____________________________________________________________  Attending physician addendum:  Thank you for sending this case to me. I have reviewed the entire note and agree with the plan.  Suspect some medication-related side effects with altered bowel habits and pain.  Difficult to determine which med/s.  Wilfrid Lund, MD  ____________________________________________________________

## 2022-11-20 ENCOUNTER — Ambulatory Visit (HOSPITAL_COMMUNITY): Payer: 59

## 2022-11-26 ENCOUNTER — Other Ambulatory Visit: Payer: Self-pay

## 2022-11-26 MED ORDER — LINACLOTIDE 72 MCG PO CAPS: 72.0000 ug | ORAL_CAPSULE | Freq: Every day | ORAL | 3 refills | Status: DC

## 2022-12-05 ENCOUNTER — Encounter (HOSPITAL_COMMUNITY): Payer: Self-pay

## 2022-12-05 ENCOUNTER — Ambulatory Visit (HOSPITAL_COMMUNITY): Payer: 59

## 2023-01-02 ENCOUNTER — Ambulatory Visit (INDEPENDENT_AMBULATORY_CARE_PROVIDER_SITE_OTHER): Payer: 59 | Admitting: Family Medicine

## 2023-01-02 ENCOUNTER — Encounter: Payer: Self-pay | Admitting: Family Medicine

## 2023-01-02 VITALS — BP 120/80 | HR 80 | Temp 98.0°F | Ht 73.5 in | Wt 198.0 lb

## 2023-01-02 DIAGNOSIS — N289 Disorder of kidney and ureter, unspecified: Secondary | ICD-10-CM | POA: Diagnosis not present

## 2023-01-02 DIAGNOSIS — W57XXXS Bitten or stung by nonvenomous insect and other nonvenomous arthropods, sequela: Secondary | ICD-10-CM

## 2023-01-02 DIAGNOSIS — M25569 Pain in unspecified knee: Secondary | ICD-10-CM | POA: Diagnosis not present

## 2023-01-02 DIAGNOSIS — W57XXXA Bitten or stung by nonvenomous insect and other nonvenomous arthropods, initial encounter: Secondary | ICD-10-CM

## 2023-01-02 DIAGNOSIS — F331 Major depressive disorder, recurrent, moderate: Secondary | ICD-10-CM

## 2023-01-02 DIAGNOSIS — S70361S Insect bite (nonvenomous), right thigh, sequela: Secondary | ICD-10-CM

## 2023-01-02 MED ORDER — ARIPIPRAZOLE 5 MG PO TABS: ORAL_TABLET | ORAL | 2 refills | Status: DC

## 2023-01-02 MED ORDER — DULOXETINE HCL 20 MG PO CPEP: 20.0000 mg | ORAL_CAPSULE | Freq: Every day | ORAL | 3 refills | Status: DC

## 2023-01-02 NOTE — Patient Instructions (Signed)
Miralax 1 scoop daily  Fiber -- at least 30 grams of fiber per day -- metamucil packets  Probiotics -- at least once a day, or twice a day if needed.

## 2023-01-02 NOTE — Progress Notes (Signed)
New Patient Office Visit  Subjective    Patient ID: [name] [name], male    DOB: 1951/03/17  Age: 72 y.o. MRN: [id number]  CC:  Chief Complaint  Patient presents with   Establish Care    HPI KONNAR BEN presents to establish care  Patient was previously being seen Dr. Katrinka Blazing at [facility]. He reports that he has had a lot of testing recently, states that he had back surgery in April of 2023 and continues to follow with a pain management physician.   States that they found some "spots" on his kidneys at the time on his MRI. States that he did go see the urologist, Dr. Sabino Gasser. I have reviewed the note from Dr. Sabino Gasser but he did not mention the "spots" on his kidney. States that he would like to be referred to a different urologist. He reports that his urine is very golden in color, sometimes very odorous. Also reports he has low T. Not currently on replacement at this time.  States that he has been losing weight, states that he is not eating well, states that he does not really have an appetite. States that there is a family history of bipolar disorder in his daughters and his wife reports he doesn't like to go places or leave his home. PHQ was reviewed. States he has been to therapy but it didn't help. States he has never seen a psychiatrist before. There was some confusion over his cymbalta dosage. He reports that he was out of the 20 mg dosage but he is supposed to be on 80 mg daily (60 mg plus 20 mg).   Patient reports that he was bitten by a tick on his right thigh last year. He states that he saw his PCP about it but he did not "do anything" about it. He is worried about lyme disease and would like to be tested.  Patient has a long history of chronic constipation, sees the GI physician. I have reviewed the note. He used to be on [place] packets but these were stopped. He had an abd film recently which showed a normal colonic stool burden, no acute findings.   I have reviewed all  aspects of the patient's medical history including social, family, and surgical history.   Current Outpatient Medications  Medication Instructions   albuterol (PROVENTIL HFA;VENTOLIN HFA) 108 (90 Base) MCG/ACT inhaler 2 puffs, Inhalation, Every 6 hours PRN   ARIPiprazole (ABILIFY) 5 MG tablet Start with 1/2 tablet daily at bedtime for 7-10 days, then increase to 1 tablet daily at bedtime.   baclofen (LIORESAL) 10 mg, Oral, 3 times daily   DULoxetine (CYMBALTA) 120 mg, Oral, Daily   DULoxetine (CYMBALTA) 20 mg, Oral, Daily   metoprolol tartrate (LOPRESSOR) 50 mg, Oral, 2 times daily   Nifedical XL 120 mg, Oral, Daily   NIFEdipine (PROCARDIA XL/NIFEDICAL XL) 60 MG 24 hr tablet 2 tablets, Oral, Daily   rivaroxaban (XARELTO) 20 mg, Oral, Daily with supper, Please follow up with Primary Care MD for future refills   Voltaren 2 g, Topical, Daily PRN, For neck and shoulder pain    Past Medical History:  Diagnosis Date   Anxiety and depression    Back pain    Diverticulitis    Diverticulosis    DVT (deep venous thrombosis) (HCC)    Gout    Hernia    History of blood clots    Hypertension    Lesion of right native kidney 01/03/2023   Nausea  and vomiting    Neuropathy, peripheral 05/15/2012   patient denies   Shingles     Past Surgical History:  Procedure Laterality Date   APPENDECTOMY     BACK SURGERY     CHOLECYSTECTOMY  09/09/2006   HERNIA REPAIR     HIATAL HERNIA REPAIR     IR ANGIO INTRA EXTRACRAN SEL INTERNAL CAROTID BILAT MOD SED  01/18/2020   IR ANGIO VERTEBRAL SEL VERTEBRAL BILAT MOD SED  01/18/2020   IR US GUIDE VASC ACCESS RIGHT  01/18/2020   LEFT HEART CATHETERIZATION WITH CORONARY ANGIOGRAM N/A 08/02/2014   Procedure: LEFT HEART CATHETERIZATION WITH CORONARY ANGIOGRAM;  Surgeon: Pamella Pert, MD;  Location[facility] CATH LAB;  Service: Cardiovascular;  Laterality: N/A;   MULTIPLE EXTRACTIONS WITH ALVEOLOPLASTY N/A 10/19/2015   Procedure: Extraction of tooth #'s 2,12,13  with alveoloplasty;  Surgeon: Charlynne Pander, DDS;  Location: [facility] OR;  Service: Oral Surgery;  Laterality: N/A;   SACRAL NERVE STIMULATOR PLACEMENT  09/09/2009   Medtronic Restore[employer]VANCED [phone] MRI info-[email or web address]    Family History  Problem Relation Age of Onset   Prostate cancer Father    Heart disease Father    Diabetes Mother    Heart disease Mother    Colon cancer Maternal Uncle        dx in his 98's   Pancreatic cancer Paternal Uncle    Prostate cancer Paternal Uncle    Multiple sclerosis Daughter    Prostate cancer Paternal Uncle    Esophageal cancer Neg Hx    Rectal cancer Neg Hx    Stomach cancer Neg Hx     Social History   Socioeconomic History   Marital status: Significant Other    Spouse name: Agricultural engineer   Number of children: 3   Years of education: 12th   Highest education level: Not on file  Occupational History   Occupation: unemployed    Associate Professor: OTHER    Employer: RETIRED  Tobacco Use   Smoking status: Every Day    Packs/day: 0.50    Years: 40.00    Additional pack years: 0.00    Total pack years: 20.00    Types: Cigarettes   Smokeless tobacco: Never  Vaping Use   Vaping Use: Never used  Substance and Sexual Activity   Alcohol use: Yes    Alcohol/week: 0.0 standard drinks of alcohol    Comment: Occasional   Drug use: Yes    Types: Marijuana    Comment: for nausea   Sexual activity: Not on file  Other Topics Concern   Not on file  Social History Narrative   Patient lives at home with his family.   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    Caffeine use: 1-2 cups daily   Social Determinants of Health   Financial Resource Strain: Not on file  Food Insecurity: Not on file  Transportation Needs: Not on file  Physical Activity: Not on file  Stress: Not on file  Social Connections: Not on file  Intimate Partner Violence: Not on file     Review of Systems  All other systems reviewed and are negative.       Objective    BP 120/80 (BP Location: Left Arm, Patient Position: Sitting, Cuff Size: Large)   Pulse 80   Temp 98 F (36.7 C) (Oral)   Ht 6' 1.5" (1.867 m)   Wt 198 lb (89.8 kg)   SpO2 98%   BMI 25.77 kg/m  Physical Exam Vitals reviewed.  Constitutional:      Appearance: Normal appearance. He is well-groomed and normal weight.  Eyes:     Extraocular Movements: Extraocular movements intact.     Conjunctiva/sclera: Conjunctivae normal.  Neck:     Thyroid: No thyromegaly.  Cardiovascular:     Rate and Rhythm: Normal rate and regular rhythm.     Heart sounds: S1 normal and S2 normal. No murmur heard. Pulmonary:     Effort: Pulmonary effort is normal.     Breath sounds: Normal breath sounds and air entry. No rales.  Abdominal:     General: Abdomen is flat. Bowel sounds are normal.  Musculoskeletal:     Right lower leg: No edema.     Left lower leg: No edema.  Neurological:     General: No focal deficit present.     Mental Status: He is alert and oriented to person, place, and time.     Gait: Gait is intact.  Psychiatric:        Mood and Affect: Mood and affect normal.    Last CBC Lab Results  Component Value Date   WBC 4.6 11/12/2022   HGB 13.6 11/12/2022   HCT 42.1 11/12/2022   MCV 72.5 (L) 11/12/2022   MCH 23.5 (L) 01/02/2022   RDW 15.0 11/12/2022   PLT 316.0 11/12/2022   Last metabolic panel Lab Results  Component Value Date   GLUCOSE 95 11/12/2022   NA 139 11/12/2022   K 3.6 11/12/2022   CL 103 11/12/2022   CO2 27 11/12/2022   BUN 11 11/12/2022   CREATININE 1.07 11/12/2022   GFRNONAA >60 01/02/2022   CALCIUM 9.7 11/12/2022   PHOS 4.1 12/23/2008   PROT 6.9 11/12/2022   ALBUMIN 4.2 11/12/2022   LABGLOB 2.2 03/17/2017   AGRATIO 1.9 03/17/2017   BILITOT 0.6 11/12/2022   ALKPHOS 58 11/12/2022   AST 13 11/12/2022   ALT 10 11/12/2022   ANIONGAP 10 01/02/2022         Assessment & Plan:  Lesion of right native kidney Assessment & Plan: Patient suffers from chronic left sided abdominal pain and was concerned that the pain was coming from his kidney. I reassured patient that it is not likely the cause, I believe that it might be coming from his back rather than the kidney. The patient has a long history of chronic back pain, surgery and he continues to see the pain management specialist for this issue. I reviewed the MRI which showed the "spots" and I will place a new referral to a urologist for evaluation and recommendations.   Orders: -     Ambulatory referral to Urology  Tick bite of right thigh, initial encounter Assessment & Plan: No current rash but pt reports that ever since the tick bite he has had knee pain. I will order lymes titers.   Orders: -     Lyme Disease Serology w/Reflex  Moderate episode of recurrent major depressive disorder (HCC) Assessment & Plan: Pt is on 80 mg daily of cymbalta and continues to have anorexia, sleep disturbances, depression, anhedonia, irritability, etc. We discussed medication and he reports adverse reaction to seroquel in the past. I recommended trying abilify once daily at bedtime. I will see him in 6-8 weeks to follow up on his symptoms. Continue cymbalta 80 mg daily as prescribed.   Orders: -     ARIPiprazole; Start with 1/2 tablet daily at bedtime for 7-10 days, then increase to 1 tablet daily  at bedtime.  Dispense: 30 tablet; Refill: 2 -     DULoxetine HCl; Take 1 capsule (20 mg total) by mouth daily.  Dispense: 90 capsule; Refill: 3  I spent 45 minutes today with patient reviewing specialists notes, labs, gathering history from him and his wife. Reviewed imaging also.   Return in about 6 weeks (around 02/13/2023) for follow up on abilify.   [name], MD

## 2023-01-03 ENCOUNTER — Encounter: Payer: Self-pay | Admitting: Family Medicine

## 2023-01-03 DIAGNOSIS — N289 Disorder of kidney and ureter, unspecified: Secondary | ICD-10-CM

## 2023-01-03 DIAGNOSIS — W57XXXA Bitten or stung by nonvenomous insect and other nonvenomous arthropods, initial encounter: Secondary | ICD-10-CM | POA: Insufficient documentation

## 2023-01-03 DIAGNOSIS — F331 Major depressive disorder, recurrent, moderate: Secondary | ICD-10-CM | POA: Insufficient documentation

## 2023-01-03 HISTORY — DX: Disorder of kidney and ureter, unspecified: N28.9

## 2023-01-03 LAB — LYME DISEASE SEROLOGY W/REFLEX: Lyme Total Antibody EIA: NEGATIVE

## 2023-01-03 NOTE — Assessment & Plan Note (Signed)
Patient suffers from chronic left sided abdominal pain and was concerned that the pain was coming from his kidney. I reassured patient that it is not likely the cause, I believe that it might be coming from his back rather than the kidney. The patient has a long history of chronic back pain, surgery and he continues to see the pain management specialist for this issue. I reviewed the MRI which showed the "spots" and I will place a new referral to a urologist for evaluation and recommendations.

## 2023-01-03 NOTE — Assessment & Plan Note (Signed)
No current rash but pt reports that ever since the tick bite he has had knee pain. I will order lymes titers.

## 2023-01-03 NOTE — Assessment & Plan Note (Signed)
Pt is on 80 mg daily of cymbalta and continues to have anorexia, sleep disturbances, depression, anhedonia, irritability, etc. We discussed medication and he reports adverse reaction to seroquel in the past. I recommended trying abilify once daily at bedtime. I will see him in 6-8 weeks to follow up on his symptoms. Continue cymbalta 80 mg daily as prescribed.

## 2023-01-08 ENCOUNTER — Encounter: Payer: Self-pay | Admitting: Urology

## 2023-01-08 ENCOUNTER — Ambulatory Visit (INDEPENDENT_AMBULATORY_CARE_PROVIDER_SITE_OTHER): Payer: 59 | Admitting: Urology

## 2023-01-08 VITALS — BP 138/85 | HR 80 | Ht 75.0 in | Wt 198.0 lb

## 2023-01-08 DIAGNOSIS — N2889 Other specified disorders of kidney and ureter: Secondary | ICD-10-CM

## 2023-01-08 DIAGNOSIS — N529 Male erectile dysfunction, unspecified: Secondary | ICD-10-CM | POA: Insufficient documentation

## 2023-01-08 DIAGNOSIS — R109 Unspecified abdominal pain: Secondary | ICD-10-CM | POA: Diagnosis not present

## 2023-01-08 DIAGNOSIS — N411 Chronic prostatitis: Secondary | ICD-10-CM

## 2023-01-08 DIAGNOSIS — R300 Dysuria: Secondary | ICD-10-CM

## 2023-01-08 DIAGNOSIS — N401 Enlarged prostate with lower urinary tract symptoms: Secondary | ICD-10-CM

## 2023-01-08 DIAGNOSIS — N138 Other obstructive and reflux uropathy: Secondary | ICD-10-CM

## 2023-01-08 LAB — MICROSCOPIC EXAMINATION
Crystal Type: NONE SEEN
Crystals: NONE SEEN
RBC, Urine: NONE SEEN /hpf (ref 0–2)
Trichomonas, UA: NONE SEEN
Yeast, UA: NONE SEEN

## 2023-01-08 LAB — URINALYSIS, ROUTINE W REFLEX MICROSCOPIC
Bilirubin, UA: NEGATIVE
Glucose, UA: NEGATIVE
Leukocytes,UA: NEGATIVE
Nitrite, UA: NEGATIVE
RBC, UA: NEGATIVE
Specific Gravity, UA: 1.025 (ref 1.005–1.030)
Urobilinogen, Ur: 1 mg/dL (ref 0.2–1.0)
pH, UA: 5.5 (ref 5.0–7.5)

## 2023-01-08 MED ORDER — SULFAMETHOXAZOLE-TRIMETHOPRIM 800-160 MG PO TABS
1.0000 | ORAL_TABLET | Freq: Two times a day (BID) | ORAL | 0 refills | Status: AC
Start: 2023-01-08 — End: 2023-01-29

## 2023-01-08 MED ORDER — TAMSULOSIN HCL 0.4 MG PO CAPS
0.4000 mg | ORAL_CAPSULE | Freq: Every day | ORAL | 11 refills | Status: AC
Start: 2023-01-08 — End: ?

## 2023-01-08 NOTE — Progress Notes (Signed)
Assessment: 1. Renal mass; right   2. Organic impotence   3. BPH with obstruction/lower urinary tract symptoms   4. Right flank pain   5. Dysuria   6. Chronic prostatitis     Plan: I personally reviewed the patient's chart including provider notes, lab and imaging results. I reviewed the patient's records from [facility]. Recommend further evaluation of the right renal lesions with a MRI with and without contrast. Trial of tamsulosin 0.4 mg daily for lower urinary tract symptoms. Bactrim DS twice daily x 21 days for prostatitis. Return to office in 1 month.  Chief Complaint:  Chief Complaint  Patient presents with   kidney lesion    History of Present Illness:  [name] [name] is a 72 y.o. male who is seen in consultation from [facility] for evaluation of right renal mass, flank pain, and dysuria.  He has a history of right-sided flank pain for approximately 1 year.  He underwent back surgery in April 2023.  He has continued to have right-sided flank pain. MRI of the lumbar spine from 9/23 showed 2 indeterminate right renal lesions possibly reflecting hemorrhagic cysts.  No additional imaging has been performed since that time.  Prior CT study from 8/22 did not show any obvious renal lesions.  He has seen Dr. Sabino Gasser at [facility] in February 2024 for evaluation of prostatitis, BPH with lower urinary tract symptoms, and erectile dysfunction.  He has a history of prostatitis requiring 1 hospitalization in the past.  In February 2024, he was having symptoms of dysuria, frequency, urgency, nocturia, and incomplete emptying.  He also had some right flank pain.  He was diagnosed with prostatitis and treated with Cipro x 10 days. He also has a history of erectile dysfunction.  Sildenafil has been ineffective for him.  He was given a prescription for tadalafil 20 mg daily. PSA 2/24:  0.34  He has continued to have lower urinary tract symptoms including  urgency, nocturia x 4, and dysuria.  He has pain during and after urination.  The burning lasts for approximately 20 minutes and resolves.  No gross hematuria.  No recent UTIs.  He did not see any improvement in his symptoms with the Cipro. IPSS = 21 today. He reports a 50 lb weight loss in the past 6 months.   Past Medical History:  Past Medical History:  Diagnosis Date   Anxiety and depression    Back pain    Diverticulitis    Diverticulosis    DVT (deep venous thrombosis) (HCC)    Gout    Hernia    History of blood clots    Hypertension    Lesion of right native kidney 01/03/2023   Nausea and vomiting    Neuropathy, peripheral 05/15/2012   patient denies   Shingles     Past Surgical History:  Past Surgical History:  Procedure Laterality Date   APPENDECTOMY     BACK SURGERY     CHOLECYSTECTOMY  09/09/2006   HERNIA REPAIR     HIATAL HERNIA REPAIR     IR ANGIO INTRA EXTRACRAN SEL INTERNAL CAROTID BILAT MOD SED  01/18/2020   IR ANGIO VERTEBRAL SEL VERTEBRAL BILAT MOD SED  01/18/2020   IR US GUIDE VASC ACCESS RIGHT  01/18/2020   LEFT HEART CATHETERIZATION WITH CORONARY ANGIOGRAM N/A 08/02/2014   Procedure: LEFT HEART CATHETERIZATION WITH CORONARY ANGIOGRAM;  Surgeon: Pamella Pert, MD;  Location: [facility] CATH LAB;  Service: Cardiovascular;  Laterality: N/A;  MULTIPLE EXTRACTIONS WITH ALVEOLOPLASTY N/A 10/19/2015   Procedure: Extraction of tooth #'s 2,12,13 with alveoloplasty;  Surgeon: Charlynne Pander, DDS;  Location: [facility] OR;  Service: Oral Surgery;  Laterality: N/A;   SACRAL NERVE STIMULATOR PLACEMENT  09/09/2009   [employer] [phone] MRI info-[email or web address]    Allergies:  Allergies  Allergen Reactions   Aspirin Other (See Comments)    ulcers   Celecoxib Other (See Comments)    Stomach irritation   Ibuprofen Other (See Comments)    ulcers   Lyrica [Pregabalin] Swelling   Nsaids    Vicodin  [Hydrocodone-Acetaminophen]     Makes me mean     Family History:  Family History  Problem Relation Age of Onset   Prostate cancer Father    Heart disease Father    Diabetes Mother    Heart disease Mother    Colon cancer Maternal Uncle        dx in his 70's   Pancreatic cancer Paternal Uncle    Prostate cancer Paternal Uncle    Multiple sclerosis Daughter    Prostate cancer Paternal Uncle    Esophageal cancer Neg Hx    Rectal cancer Neg Hx    Stomach cancer Neg Hx     Social History:  Social History   Tobacco Use   Smoking status: Every Day    Packs/day: 0.50    Years: 40.00    Additional pack years: 0.00    Total pack years: 20.00    Types: Cigarettes   Smokeless tobacco: Never  Vaping Use   Vaping Use: Never used  Substance Use Topics   Alcohol use: Yes    Alcohol/week: 0.0 standard drinks of alcohol    Comment: Occasional   Drug use: Yes    Types: Marijuana    Comment: for nausea    Review of symptoms:  Constitutional:  Negative for unexplained weight loss, night sweats, fever, chills ENT:  Negative for nose bleeds, sinus pain, painful swallowing CV:  Negative for chest pain, shortness of breath, exercise intolerance, palpitations, loss of consciousness Resp:  Negative for cough, wheezing, shortness of breath GI:  Negative for nausea, vomiting, diarrhea, bloody stools GU:  Positives noted in HPI; otherwise negative for gross hematuria, urinary incontinence Neuro:  Negative for seizures, poor balance, limb weakness, slurred speech Psych:  Negative for lack of energy, depression, anxiety Endocrine:  Negative for polydipsia, polyuria, symptoms of hypoglycemia (dizziness, hunger, sweating) Hematologic:  Negative for anemia, purpura, petechia, prolonged or excessive bleeding, use of anticoagulants  Allergic:  Negative for difficulty breathing or choking as a result of exposure to anything; no shellfish allergy; no allergic response (rash/itch) to materials,  foods  Physical exam: BP 138/85   Pulse 80   Ht 6\' 3"  (1.905 m)   Wt 198 lb (89.8 kg)   BMI 24.75 kg/m  GENERAL APPEARANCE:  Well appearing, well developed, well nourished, NAD HEENT: Atraumatic, Normocephalic, oropharynx clear. NECK: Supple without lymphadenopathy or thyromegaly. LUNGS: Clear to auscultation bilaterally. HEART: Regular Rate and Rhythm without murmurs, gallops, or rubs. ABDOMEN: Soft, non-tender, No Masses. EXTREMITIES: Moves all extremities well.  Without clubbing, cyanosis, or edema. NEUROLOGIC:  Alert and oriented x 3, normal gait, CN II-XII grossly intact.  MENTAL STATUS:  Appropriate. BACK:  Non-tender to palpation.  No CVAT SKIN:  Warm, dry and intact.   GU: Penis:  uncircumcised Meatus: Normal Scrotum: No erythema or edema Testis: Right testicle atrophic; left normal Epididymis: normal Prostate: 40 g, tender to palpation, no  nodules Rectum: Normal tone,  no masses or tenderness   Results: U/A:  0-5 WBC, many bacteria

## 2023-01-22 ENCOUNTER — Ambulatory Visit (HOSPITAL_BASED_OUTPATIENT_CLINIC_OR_DEPARTMENT_OTHER): Payer: 59 | Attending: Urology

## 2023-02-10 ENCOUNTER — Ambulatory Visit (INDEPENDENT_AMBULATORY_CARE_PROVIDER_SITE_OTHER): Payer: 59 | Admitting: Urology

## 2023-02-10 ENCOUNTER — Encounter: Payer: Self-pay | Admitting: Urology

## 2023-02-10 VITALS — BP 153/102 | HR 106 | Ht 76.0 in | Wt 198.0 lb

## 2023-02-10 DIAGNOSIS — N138 Other obstructive and reflux uropathy: Secondary | ICD-10-CM

## 2023-02-10 DIAGNOSIS — N401 Enlarged prostate with lower urinary tract symptoms: Secondary | ICD-10-CM

## 2023-02-10 DIAGNOSIS — R8290 Unspecified abnormal findings in urine: Secondary | ICD-10-CM | POA: Diagnosis not present

## 2023-02-10 DIAGNOSIS — N2889 Other specified disorders of kidney and ureter: Secondary | ICD-10-CM | POA: Diagnosis not present

## 2023-02-10 DIAGNOSIS — N529 Male erectile dysfunction, unspecified: Secondary | ICD-10-CM | POA: Diagnosis not present

## 2023-02-10 LAB — URINALYSIS, ROUTINE W REFLEX MICROSCOPIC
Bilirubin, UA: NEGATIVE
Glucose, UA: NEGATIVE
Leukocytes,UA: NEGATIVE
Nitrite, UA: NEGATIVE
RBC, UA: NEGATIVE
Specific Gravity, UA: 1.015 (ref 1.005–1.030)
Urobilinogen, Ur: 0.2 mg/dL (ref 0.2–1.0)
pH, UA: 6 (ref 5.0–7.5)

## 2023-02-10 LAB — MICROSCOPIC EXAMINATION
Crystal Type: NONE SEEN
Crystals: NONE SEEN
RBC, Urine: NONE SEEN /hpf (ref 0–2)
Trichomonas, UA: NONE SEEN
Yeast, UA: NONE SEEN

## 2023-02-10 LAB — BLADDER SCAN AMB NON-IMAGING

## 2023-02-10 NOTE — Progress Notes (Signed)
Assessment: 1. Renal mass; right   2. BPH with obstruction/lower urinary tract symptoms   3. Organic impotence   4. Abnormal urine findings     Plan: Urine culture sent today Cancel MRI CT abdomen with and without IV contrast for evaluation of renal masses Continue tamsulosin Will call with results and to arrange next visit  Chief Complaint:  Chief Complaint  Patient presents with   Benign Prostatic Hypertrophy    History of Present Illness:  [name] [name] is a 72 y.o. male who is seen for further evaluation of right renal mass, flank pain, and dysuria.  He has a history of right-sided flank pain for approximately 1 year.  He underwent back surgery in April 2023.  He continued to have right-sided flank pain. MRI of the lumbar spine from 9/23 showed 2 indeterminate right renal lesions possibly reflecting hemorrhagic cysts.  No additional imaging has been performed since that time.  Prior CT study from 8/22 did not show any obvious renal lesions.  He was seen by Dr. Sabino Gasser at [facility] in February 2024 for evaluation of prostatitis, BPH with lower urinary tract symptoms, and erectile dysfunction.  He has a history of prostatitis requiring 1 hospitalization in the past.  In February 2024, he was having symptoms of dysuria, frequency, urgency, nocturia, and incomplete emptying.  He also had some right flank pain.  He was diagnosed with prostatitis and treated with Cipro x 10 days. He also has a history of erectile dysfunction.  Sildenafil has been ineffective for him.  He was given a prescription for tadalafil 20 mg daily. PSA 2/24:  0.34  He continued to have lower urinary tract symptoms including urgency, nocturia x 4, and dysuria.  He had pain during and after urination.  The burning lasts for approximately 20 minutes and resolves.  No gross hematuria.  No recent UTIs.  He did not see any improvement in his symptoms with the Cipro. IPSS = 21. He reports a 50 lb weight  loss in the past 6 months.  He was diagnosed with prostatitis and treated with Bactrim DS x 21 days.  He was also started on tamsulosin 0.4 mg daily.  He has not had his MRI due to concerns about his ability to tolerate study given his prior experience. He completed the antibiotics and he continues on tamsulosin.  He continues to have urgency, nocturia 5-6 times, and dysuria.  He has not seen any significant improvement with current treatment.     Portions of the above documentation were copied from a prior visit for review purposes only.  Past Medical History:  Past Medical History:  Diagnosis Date   Anxiety and depression    Back pain    Diverticulitis    Diverticulosis    DVT (deep venous thrombosis) (HCC)    Gout    Hernia    History of blood clots    Hypertension    Lesion of right native kidney 01/03/2023   Nausea and vomiting    Neuropathy, peripheral 05/15/2012   patient denies   Shingles     Past Surgical History:  Past Surgical History:  Procedure Laterality Date   APPENDECTOMY     BACK SURGERY     CHOLECYSTECTOMY  09/09/2006   HERNIA REPAIR     HIATAL HERNIA REPAIR     IR ANGIO INTRA EXTRACRAN SEL INTERNAL CAROTID BILAT MOD SED  01/18/2020   IR ANGIO VERTEBRAL SEL VERTEBRAL BILAT MOD SED  01/18/2020  IR US GUIDE VASC ACCESS RIGHT  01/18/2020   LEFT HEART CATHETERIZATION WITH CORONARY ANGIOGRAM N/A 08/02/2014   Procedure: LEFT HEART CATHETERIZATION WITH CORONARY ANGIOGRAM;  Surgeon: Pamella Pert, MD;  Location: [facility] CATH LAB;  Service: Cardiovascular;  Laterality: N/A;   MULTIPLE EXTRACTIONS WITH ALVEOLOPLASTY N/A 10/19/2015   Procedure: Extraction of tooth #'s 2,12,13 with alveoloplasty;  Surgeon: Charlynne Pander, DDS;  Location: [facility] OR;  Service: Oral Surgery;  Laterality: N/A;   SACRAL NERVE STIMULATOR PLACEMENT  09/09/2009   [employer] Restore[employer]VANCED [phone] MRI info-[email or web address]     Allergies:  Allergies  Allergen Reactions   Aspirin Other (See Comments)    ulcers   Celecoxib Other (See Comments)    Stomach irritation   Ibuprofen Other (See Comments)    ulcers   Lyrica [Pregabalin] Swelling   Nsaids    Vicodin [Hydrocodone-Acetaminophen]     Makes me mean     Family History:  Family History  Problem Relation Age of Onset   Prostate cancer Father    Heart disease Father    Diabetes Mother    Heart disease Mother    Colon cancer Maternal Uncle        dx in his 17's   Pancreatic cancer Paternal Uncle    Prostate cancer Paternal Uncle    Multiple sclerosis Daughter    Prostate cancer Paternal Uncle    Esophageal cancer Neg Hx    Rectal cancer Neg Hx    Stomach cancer Neg Hx     Social History:  Social History   Tobacco Use   Smoking status: Every Day    Packs/day: 0.50    Years: 40.00    Additional pack years: 0.00    Total pack years: 20.00    Types: Cigarettes   Smokeless tobacco: Never  Vaping Use   Vaping Use: Never used  Substance Use Topics   Alcohol use: Yes    Alcohol/week: 0.0 standard drinks of alcohol    Comment: Occasional   Drug use: Yes    Types: Marijuana    Comment: for nausea    ROS: Constitutional:  Negative for fever, chills, weight loss CV: Negative for chest pain, previous MI, hypertension Respiratory:  Negative for shortness of breath, wheezing, sleep apnea, frequent cough GI:  Negative for nausea, vomiting, bloody stool, GERD  Physical exam: BP (!) 153/102   Pulse (!) 106   Ht 6\' 4"  (1.93 m)   Wt 198 lb (89.8 kg)   BMI 24.10 kg/m  GENERAL APPEARANCE:  Well appearing, well developed, well nourished, NAD HEENT:  Atraumatic, normocephalic, oropharynx clear NECK:  Supple without lymphadenopathy or thyromegaly ABDOMEN:  Soft, non-tender, no masses EXTREMITIES:  Moves all extremities well, without clubbing, cyanosis, or edema NEUROLOGIC:  Alert and oriented x 3, normal gait, CN II-XII grossly  intact MENTAL STATUS:  appropriate BACK:  Non-tender to palpation, No CVAT SKIN:  Warm, dry, and intact   Results: U/A:  0-5 WBC, 0 RBC, many bacteria  PVR:  0 ml

## 2023-02-13 ENCOUNTER — Telehealth: Payer: Self-pay

## 2023-02-13 ENCOUNTER — Ambulatory Visit: Payer: 59 | Admitting: Family Medicine

## 2023-02-13 LAB — URINE CULTURE: Organism ID, Bacteria: NO GROWTH

## 2023-02-13 NOTE — Telephone Encounter (Signed)
Pt notified, expressed understanding.

## 2023-02-13 NOTE — Telephone Encounter (Signed)
-----   Message from Milderd Meager, MD sent at 02/13/2023 10:59 AM EDT ----- Please notify patient of negative urine culture

## 2023-02-15 ENCOUNTER — Ambulatory Visit (HOSPITAL_BASED_OUTPATIENT_CLINIC_OR_DEPARTMENT_OTHER)
Admission: RE | Admit: 2023-02-15 | Discharge: 2023-02-15 | Disposition: A | Discharge status: Home / Self Care | Payer: 59 | Source: Ambulatory Visit | Attending: Urology | Admitting: Urology

## 2023-02-15 DIAGNOSIS — N2889 Other specified disorders of kidney and ureter: Secondary | ICD-10-CM | POA: Diagnosis present

## 2023-02-15 MED ORDER — IOHEXOL 300 MG/ML  SOLN
100.0000 mL | Freq: Once | INTRAMUSCULAR | Status: AC | PRN
  Administered 2023-02-15: 100 mL via INTRAVENOUS

## 2023-02-17 ENCOUNTER — Ambulatory Visit: Payer: 59 | Admitting: Family Medicine

## 2023-02-25 ENCOUNTER — Encounter: Payer: Self-pay | Admitting: Urology

## 2023-02-27 ENCOUNTER — Observation Stay (HOSPITAL_COMMUNITY)
Admission: EM | Admit: 2023-02-27 | Discharge: 2023-03-01 | Disposition: A | Discharge status: Home Health | Payer: 59 | Attending: Family Medicine | Admitting: Family Medicine

## 2023-02-27 ENCOUNTER — Other Ambulatory Visit: Payer: Self-pay

## 2023-02-27 ENCOUNTER — Emergency Department (HOSPITAL_COMMUNITY): Payer: 59

## 2023-02-27 ENCOUNTER — Encounter (HOSPITAL_COMMUNITY): Payer: Self-pay

## 2023-02-27 ENCOUNTER — Ambulatory Visit: Payer: 59 | Admitting: Family Medicine

## 2023-02-27 DIAGNOSIS — I10 Essential (primary) hypertension: Secondary | ICD-10-CM | POA: Diagnosis not present

## 2023-02-27 DIAGNOSIS — N138 Other obstructive and reflux uropathy: Secondary | ICD-10-CM | POA: Diagnosis present

## 2023-02-27 DIAGNOSIS — F17210 Nicotine dependence, cigarettes, uncomplicated: Secondary | ICD-10-CM | POA: Insufficient documentation

## 2023-02-27 DIAGNOSIS — Z86718 Personal history of other venous thrombosis and embolism: Secondary | ICD-10-CM | POA: Diagnosis not present

## 2023-02-27 DIAGNOSIS — Z7901 Long term (current) use of anticoagulants: Secondary | ICD-10-CM | POA: Insufficient documentation

## 2023-02-27 DIAGNOSIS — M545 Low back pain, unspecified: Secondary | ICD-10-CM | POA: Diagnosis present

## 2023-02-27 DIAGNOSIS — M48061 Spinal stenosis, lumbar region without neurogenic claudication: Secondary | ICD-10-CM | POA: Diagnosis present

## 2023-02-27 DIAGNOSIS — M48062 Spinal stenosis, lumbar region with neurogenic claudication: Principal | ICD-10-CM | POA: Insufficient documentation

## 2023-02-27 DIAGNOSIS — Z79899 Other long term (current) drug therapy: Secondary | ICD-10-CM | POA: Diagnosis not present

## 2023-02-27 DIAGNOSIS — E785 Hyperlipidemia, unspecified: Secondary | ICD-10-CM | POA: Diagnosis not present

## 2023-02-27 DIAGNOSIS — D509 Iron deficiency anemia, unspecified: Secondary | ICD-10-CM | POA: Diagnosis not present

## 2023-02-27 DIAGNOSIS — F418 Other specified anxiety disorders: Secondary | ICD-10-CM | POA: Diagnosis present

## 2023-02-27 LAB — CBC WITH DIFFERENTIAL/PLATELET
Abs Immature Granulocytes: 0.01 10*3/uL (ref 0.00–0.07)
Basophils Absolute: 0 10*3/uL (ref 0.0–0.1)
Basophils Relative: 1 %
Eosinophils Absolute: 0.1 10*3/uL (ref 0.0–0.5)
Eosinophils Relative: 2 %
HCT: 40.5 % (ref 39.0–52.0)
Hemoglobin: 12.9 g/dL — ABNORMAL LOW (ref 13.0–17.0)
Immature Granulocytes: 0 %
Lymphocytes Relative: 36 %
Lymphs Abs: 1.2 10*3/uL (ref 0.7–4.0)
MCH: 23.1 pg — ABNORMAL LOW (ref 26.0–34.0)
MCHC: 31.9 g/dL (ref 30.0–36.0)
MCV: 72.5 fL — ABNORMAL LOW (ref 80.0–100.0)
Monocytes Absolute: 0.2 10*3/uL (ref 0.1–1.0)
Monocytes Relative: 7 %
Neutro Abs: 1.7 10*3/uL (ref 1.7–7.7)
Neutrophils Relative %: 54 %
Platelets: 240 10*3/uL (ref 150–400)
RBC: 5.59 MIL/uL (ref 4.22–5.81)
RDW: 16 % — ABNORMAL HIGH (ref 11.5–15.5)
WBC: 3.2 10*3/uL — ABNORMAL LOW (ref 4.0–10.5)
nRBC: 0 % (ref 0.0–0.2)

## 2023-02-27 LAB — BASIC METABOLIC PANEL
Anion gap: 8 (ref 5–15)
BUN: 10 mg/dL (ref 8–23)
CO2: 29 mmol/L (ref 22–32)
Calcium: 9.3 mg/dL (ref 8.9–10.3)
Chloride: 100 mmol/L (ref 98–111)
Creatinine, Ser: 0.95 mg/dL (ref 0.61–1.24)
GFR, Estimated: >60 mL/min (ref >60)
Glucose, Bld: 92 mg/dL (ref 70–99)
Potassium: 3.7 mmol/L (ref 3.5–5.1)
Sodium: 137 mmol/L (ref 135–145)

## 2023-02-27 MED ORDER — METHYLPREDNISOLONE 4 MG PO TBPK: ORAL_TABLET | ORAL | 0 refills | Status: DC

## 2023-02-27 MED ORDER — DEXAMETHASONE SODIUM PHOSPHATE 10 MG/ML IJ SOLN
10.0000 mg | Freq: Once | INTRAMUSCULAR | Status: AC
  Administered 2023-02-27: 10 mg via INTRAVENOUS
  Filled 2023-02-27: qty 1

## 2023-02-27 MED ORDER — ACETAMINOPHEN 500 MG PO TABS
1000.0000 mg | ORAL_TABLET | Freq: Once | ORAL | Status: AC
  Administered 2023-02-27: 1000 mg via ORAL
  Filled 2023-02-27: qty 2

## 2023-02-27 MED ORDER — DIAZEPAM 2 MG PO TABS
2.0000 mg | ORAL_TABLET | Freq: Once | ORAL | Status: AC
  Administered 2023-02-27: 2 mg via ORAL
  Filled 2023-02-27: qty 1

## 2023-02-27 MED ORDER — GADOBUTROL 1 MMOL/ML IV SOLN
8.5000 mL | Freq: Once | INTRAVENOUS | Status: AC | PRN
  Administered 2023-02-27: 8.5 mL via INTRAVENOUS

## 2023-02-27 MED ORDER — KETOROLAC TROMETHAMINE 15 MG/ML IJ SOLN
15.0000 mg | Freq: Once | INTRAMUSCULAR | Status: AC
  Administered 2023-02-27: 15 mg via INTRAMUSCULAR
  Filled 2023-02-27: qty 1

## 2023-02-27 MED ORDER — OXYCODONE HCL 5 MG PO TABS
5.0000 mg | ORAL_TABLET | Freq: Once | ORAL | Status: AC
  Administered 2023-02-27: 5 mg via ORAL
  Filled 2023-02-27: qty 1

## 2023-02-27 NOTE — ED Provider Notes (Signed)
[facility]ERG[facility]Y [facility]MENT AT [facility] Provider Note   CSN: [id number] Arrival date & time: 02/27/23  1539     History  Chief Complaint  Patient presents with   Back Pain   Weakness    [name] [name] is a 72 y.o. male.  72 yo M with a chief complaint of low back pain and left leg weakness.  This has been going on for almost a week now.  He tells me that he bent over to pick something up and then when he sat up had sudden pain to his back.  Has had difficulty getting around since.  He feels like his legs are just getting give out from under him especially his left leg.  Left leg is also painful.  He thinks he is fallen about 3 times.  Has landed on his knees a few times.  He denies any loss of bladder or bowel.  He thought about 4 days ago he had diminished sensation to his perirectal area.  No fevers.  No obvious trauma to the back.   Back Pain Associated symptoms: weakness   Weakness      Home Medications Prior to Admission medications   Medication Sig Start Date End Date Taking? Authorizing Provider  methylPREDNISolone (MEDROL DOSEPAK) 4 MG TBPK tablet Day 1: 8mg  before breakfast, 4 mg after lunch, 4 mg after supper, and 8 mg at bedtime Day 2: 4 mg before breakfast, 4 mg after lunch, 4 mg  after supper, and 8 mg  at bedtime Day 3:  4 mg  before breakfast, 4 mg  after lunch, 4 mg after supper, and 4 mg  at bedtime Day 4: 4 mg  before breakfast, 4 mg  after lunch, and 4 mg at bedtime Day 5: 4 mg  before breakfast and 4 mg at bedtime Day 6: 4 mg  before breakfast 02/27/23  Yes Melene Plan, DO  albuterol (PROVENTIL HFA;VENTOLIN HFA) 108 (90 Base) MCG/ACT inhaler Inhale 2 puffs into the lungs every 6 (six) hours as needed for wheezing or shortness of breath. 01/27/17   Derwood Kaplan, MD  ARIPiprazole (ABILIFY) 5 MG tablet Start with 1/2 tablet daily at bedtime for 7-10 days, then increase to 1 tablet daily at bedtime. 01/02/23   [name], MD  atorvastatin  (LIPITOR) 20 MG tablet Take by mouth. 10/18/21   [provider]  baclofen (LIORESAL) 10 MG tablet Take 10 mg by mouth 3 (three) times daily.    [provider]  DULoxetine (CYMBALTA) 20 MG capsule Take 1 capsule (20 mg total) by mouth daily. 01/02/23   [name], MD  DULoxetine (CYMBALTA) 60 MG capsule Take 120 mg by mouth daily.     [provider]  metoprolol tartrate (LOPRESSOR) 25 MG tablet Take 50 mg by mouth 2 (two) times daily.  07/29/14   [provider]  NIFEDICAL XL 60 MG 24 hr tablet Take 120 mg by mouth daily.  02/21/14   [provider]  NIFEdipine (PROCARDIA XL/NIFEDICAL XL) 60 MG 24 hr tablet Take 2 tablets by mouth daily. 03/13/20   [provider]  oxyCODONE-acetaminophen (PERCOCET) 10-325 MG tablet Take 1 tablet by mouth every 4 (four) hours as needed.    [provider]  rivaroxaban (XARELTO) 20 MG TABS tablet Take 1 tablet (20 mg total) by mouth daily with supper. Please follow up with Primary Care MD for future refills 10/19/19   Johney Maine, MD  [facility] 160-4.5 MCG/ACT  inhaler INHALE 2 PUFFS BY MOUTH IN THE MORNING AND IN THE EVENING AND 12 HOURS APART 02/03/22   [provider]  tamsulosin (FLOMAX) 0.4 MG CAPS capsule Take 1 capsule (0.4 mg total) by mouth daily. 01/08/23   Stoneking, Danford Bad., MD  VOLTAREN 1 % GEL Apply 2 g topically daily as needed (pain). For neck and shoulder pain 02/23/14   [provider]      Allergies    Aspirin, Celecoxib, Ibuprofen, Lyrica [pregabalin], Nsaids, and Vicodin [hydrocodone-acetaminophen]    Review of Systems   Review of Systems  Musculoskeletal:  Positive for back pain.  Neurological:  Positive for weakness.    Physical Exam Updated Vital Signs BP 136/86   Pulse 76   Temp 98.1 F (36.7 C) (Oral)   Resp 16   Ht 6\' 4"  (1.93 m)   Wt 89.8 kg   SpO2 97%   BMI 24.10 kg/m  Physical Exam Vitals and nursing note reviewed.   Constitutional:      Appearance: He is well-developed.  HENT:     Head: Normocephalic and atraumatic.  Eyes:     Pupils: Pupils are equal, round, and reactive to light.  Neck:     Vascular: No JVD.  Cardiovascular:     Rate and Rhythm: Normal rate and regular rhythm.     Heart sounds: No murmur heard.    No friction rub. No gallop.  Pulmonary:     Effort: No respiratory distress.     Breath sounds: No wheezing.  Abdominal:     General: There is no distension.     Tenderness: There is no abdominal tenderness. There is no guarding or rebound.  Musculoskeletal:        General: Normal range of motion.     Cervical back: Normal range of motion and neck supple.     Comments: Positive straight leg raise test on the left.  No obvious palpable pulses bilaterally.  His feet are warm and appear well-perfused.  Cap refill less than 2 seconds.  Subjective decrease sensation to the left foot but motor and sensation intact to light touch.  No clonus reflexes 2+ and equal.  Skin:    Coloration: Skin is not pale.     Findings: No rash.  Neurological:     Mental Status: He is alert and oriented to person, place, and time.  Psychiatric:        Behavior: Behavior normal.     ED Results / Procedures / Treatments   Labs (all labs ordered are listed, but only abnormal results are displayed) Labs Reviewed  CBC WITH DIFFERENTIAL/PLATELET - Abnormal; Notable for the following components:      Result Value   WBC 3.2 (*)    Hemoglobin 12.9 (*)    MCV 72.5 (*)    MCH 23.1 (*)    RDW 16.0 (*)    All other components within normal limits  BASIC METABOLIC PANEL    EKG None  Radiology MR Lumbar Spine W Wo Contrast  Result Date: 02/27/2023 CLINICAL DATA:  Low back pain EXAM: MRI LUMBAR SPINE WITHOUT AND WITH CONTRAST TECHNIQUE: Multiplanar and multiecho pulse sequences of the lumbar spine were obtained without and with intravenous contrast. CONTRAST:  8.54mL GADAVIST GADOBUTROL 1 MMOL/ML IV SOLN  COMPARISON:  05/20/2022 FINDINGS: Segmentation:  Standard. Alignment:  Grade 1 retrolisthesis at L3-4 Vertebrae: L4-5 posterior fusion. No acute fracture. L1 hemangioma. Conus medullaris and cauda equina: Conus extends to the L2 level. Buckling of  the cauda equina above the L3-4 level. Paraspinal and other soft tissues: Negative Disc levels: L1-L2: Normal disc space and facet joints. No spinal canal stenosis. No neural foraminal stenosis. L2-L3: Small disc bulge with moderate facet hypertrophy. No spinal canal stenosis. No neural foraminal stenosis. L3-L4: Small disc bulge. Slightly worsened severe spinal canal stenosis. Mild bilateral neural foraminal stenosis. L4-L5: Postfusion changes. No spinal canal stenosis. No neural foraminal stenosis. L5-S1: Normal disc space and facet joints. No spinal canal stenosis. No neural foraminal stenosis. Visualized sacrum: Normal. IMPRESSION: 1. Slightly worsened severe spinal canal stenosis at L3-L4 with cauda equina impingement. 2. L4-5 posterior fusion without residual spinal canal or neural foraminal stenosis. Electronically Signed   By: Deatra Robinson M.D.   On: 02/27/2023 20:58   DG Lumbar Spine Complete  Result Date: 02/27/2023 CLINICAL DATA:  Low back pain.  Fall on Saturday. EXAM: LUMBAR SPINE - COMPLETE 4+ VIEW COMPARISON:  Radiograph 11/28/2021 my MRI 05/20/2022 FINDINGS: Five non-rib-bearing lumbar vertebra. Posterior rod with intrapedicular screw fusion and interbody spacer at L4-L5. Hardware is intact. Unchanged trace anterolisthesis of L4 on L5. Alignment is otherwise normal. No evidence of fracture or compression deformity. Multilevel degenerative disc disease and facet hypertrophy. IVC filter is partially included in the field of view. IMPRESSION: 1. No acute fracture or subluxation of the lumbar spine. 2. Posterior fusion at L4-L5 without hardware complication. 3. Multilevel degenerative change. Electronically Signed   By: Narda Rutherford M.D.   On:  02/27/2023 17:34   DG Knee Complete 4 Views Left  Result Date: 02/27/2023 CLINICAL DATA:  Leg pain. EXAM: LEFT KNEE - COMPLETE 4+ VIEW; RIGHT KNEE - COMPLETE 4 VIEW COMPARISON:  None Available. FINDINGS: Left knee: No fracture or dislocation. Slight joint space loss of the patellofemoral joint with moderate osteophytes. Small osteophytes of the lateral compartment. No joint effusion on lateral view. Mild hyperostosis along the patella. Chondrocalcinosis. Right knee: Osteopenia. Small osteophytes seen of all 3 compartments, greatest of the patellofemoral joint and lateral compartment. Slight joint space loss of the medial compartment. No joint effusion. Hyperostosis along the patella. Slight chondrocalcinosis. IMPRESSION: Mild-to-moderate bilateral degenerative changes.  No joint effusion. Chondrocalcinosis. Electronically Signed   By: Karen Kays M.D.   On: 02/27/2023 17:34   DG Knee Complete 4 Views Right  Result Date: 02/27/2023 CLINICAL DATA:  Leg pain. EXAM: LEFT KNEE - COMPLETE 4+ VIEW; RIGHT KNEE - COMPLETE 4 VIEW COMPARISON:  None Available. FINDINGS: Left knee: No fracture or dislocation. Slight joint space loss of the patellofemoral joint with moderate osteophytes. Small osteophytes of the lateral compartment. No joint effusion on lateral view. Mild hyperostosis along the patella. Chondrocalcinosis. Right knee: Osteopenia. Small osteophytes seen of all 3 compartments, greatest of the patellofemoral joint and lateral compartment. Slight joint space loss of the medial compartment. No joint effusion. Hyperostosis along the patella. Slight chondrocalcinosis. IMPRESSION: Mild-to-moderate bilateral degenerative changes.  No joint effusion. Chondrocalcinosis. Electronically Signed   By: Karen Kays M.D.   On: 02/27/2023 17:34    Procedures Procedures    Medications Ordered in ED Medications  acetaminophen (TYLENOL) tablet 1,000 mg (1,000 mg Oral Given 02/27/23 1733)  ketorolac (TORADOL) 15 MG/ML  injection 15 mg (15 mg Intramuscular Given 02/27/23 1734)  oxyCODONE (Oxy IR/ROXICODONE) immediate release tablet 5 mg (5 mg Oral Given 02/27/23 1734)  diazepam (VALIUM) tablet 2 mg (2 mg Oral Given 02/27/23 1734)  gadobutrol (GADAVIST) 1 MMOL/ML injection 8.5 mL (8.5 mLs Intravenous Contrast Given 02/27/23 2013)  dexamethasone (DECADRON) injection  10 mg (10 mg Intravenous Given 02/27/23 2246)    ED Course/ Medical Decision Making/ A&P                             Medical Decision Making Amount and/or Complexity of Data Reviewed Labs: ordered. Radiology: ordered.  Risk OTC drugs. Prescription drug management.   59 yoM with a chief complaints of low back pain and left leg weakness.  Going on for the better part of the week.  Patient has a known history of spinal stenosis and sees spinal surgeon for this in a different hospital system.  He has suffered a few falls afterwards.  Will obtain plain film followed by an MRI.  Plain films of bilateral knees as well as complaining of bilateral knee pain.  Treat pain.  Reassess.  Plain film of L-spine independently interpreted by me without obvious fracture.  Plain film of the knee bilaterally independently interpreted by me without obvious fracture or dislocation.  No leukocytosis, no significant anemia.  No significant electrolyte abnormalities.  MRI of the L-spine is read as severe spinal stenosis with cauda equina impingement.  Will discuss with neurosurgery.  I discussed with Megan from neurosurgery, felt to be unlikely consistent with cauda equina based on my history and exam.  Recommended a dose of Decadron here and then a steroid Dosepak.  Recommended following up with his primary spinal surgeon.  I discussed this with the patient who agrees.   11:08 PM:  I have discussed the diagnosis/risks/treatment options with the patient.  Evaluation and diagnostic testing in the emergency department does not suggest an emergent condition requiring  admission or immediate intervention beyond what has been performed at this time.  They will follow up with Spinal surgery. We also discussed returning to the ED immediately if new or worsening sx occur. We discussed the sx which are most concerning (e.g., sudden worsening pain, fever, inability to tolerate by mouth) that necessitate immediate return. Medications administered to the patient during their visit and any new prescriptions provided to the patient are listed below.  Medications given during this visit Medications  acetaminophen (TYLENOL) tablet 1,000 mg (1,000 mg Oral Given 02/27/23 1733)  ketorolac (TORADOL) 15 MG/ML injection 15 mg (15 mg Intramuscular Given 02/27/23 1734)  oxyCODONE (Oxy IR/ROXICODONE) immediate release tablet 5 mg (5 mg Oral Given 02/27/23 1734)  diazepam (VALIUM) tablet 2 mg (2 mg Oral Given 02/27/23 1734)  gadobutrol (GADAVIST) 1 MMOL/ML injection 8.5 mL (8.5 mLs Intravenous Contrast Given 02/27/23 2013)  dexamethasone (DECADRON) injection 10 mg (10 mg Intravenous Given 02/27/23 2246)     The patient appears reasonably screen and/or stabilized for discharge and I doubt any other medical condition or other [facility] requiring further screening, evaluation, or treatment in the ED at this time prior to discharge.           Final Clinical Impression(s) / ED Diagnoses Final diagnoses:  Spinal stenosis of lumbar region with neurogenic claudication    Rx / DC Orders ED Discharge Orders          Ordered    methylPREDNISolone (MEDROL DOSEPAK) 4 MG TBPK tablet        02/27/23 2301              Melene Plan, DO 02/27/23 2308

## 2023-02-27 NOTE — Discharge Instructions (Signed)
Call your neurosurgeon tomorrow and let them know about your visit to the ED.  Take the steroids as prescribed.  Also take tylenol 1000mg (2 extra strength) four times a day.

## 2023-02-27 NOTE — ED Notes (Addendum)
When this nurse administered IVP decadron to patient, he asked "is this medicine going to be able to make me walk again?" Physician notified of concerns that pt unable to ambulate at home, road test attempted and upon first attempt to stand patient he reported severe sharp pain in his back radiating downward. Second attempt to stand and ambulate pt exhibits severe difficulty to lift right foot and slide forward. Pt had x2 staff assist with this road test. Dr. Adela Lank updated on failed road test.

## 2023-02-27 NOTE — ED Triage Notes (Signed)
BIBA lower back pain and pain shooting down legs bilaterally. Back surgery in 2022 to correct scoliosis. Epidural scheduled 03/04/2023, however pt had fall onto knees on Saturday. Denies any injuries, no LOC, or did not hit his head. Weakness in both legs. Unable to stand on his own. Degenerative disc disease. Pt takes blood thinners.   BP 150/82 HR 80 98% RA

## 2023-02-28 DIAGNOSIS — E785 Hyperlipidemia, unspecified: Secondary | ICD-10-CM | POA: Diagnosis present

## 2023-02-28 DIAGNOSIS — D509 Iron deficiency anemia, unspecified: Secondary | ICD-10-CM | POA: Diagnosis not present

## 2023-02-28 DIAGNOSIS — I10 Essential (primary) hypertension: Secondary | ICD-10-CM | POA: Diagnosis present

## 2023-02-28 DIAGNOSIS — Z86718 Personal history of other venous thrombosis and embolism: Secondary | ICD-10-CM

## 2023-02-28 DIAGNOSIS — M48062 Spinal stenosis, lumbar region with neurogenic claudication: Secondary | ICD-10-CM | POA: Diagnosis not present

## 2023-02-28 DIAGNOSIS — F418 Other specified anxiety disorders: Secondary | ICD-10-CM | POA: Diagnosis present

## 2023-02-28 DIAGNOSIS — M48061 Spinal stenosis, lumbar region without neurogenic claudication: Secondary | ICD-10-CM | POA: Diagnosis present

## 2023-02-28 LAB — BASIC METABOLIC PANEL
Anion gap: 7 (ref 5–15)
BUN: 12 mg/dL (ref 8–23)
CO2: 28 mmol/L (ref 22–32)
Calcium: 9 mg/dL (ref 8.9–10.3)
Chloride: 101 mmol/L (ref 98–111)
Creatinine, Ser: 0.84 mg/dL (ref 0.61–1.24)
GFR, Estimated: >60 mL/min (ref >60)
Glucose, Bld: 178 mg/dL — ABNORMAL HIGH (ref 70–99)
Potassium: 3.9 mmol/L (ref 3.5–5.1)
Sodium: 136 mmol/L (ref 135–145)

## 2023-02-28 LAB — CBC
HCT: 39.5 % (ref 39.0–52.0)
Hemoglobin: 12.8 g/dL — ABNORMAL LOW (ref 13.0–17.0)
MCH: 23.3 pg — ABNORMAL LOW (ref 26.0–34.0)
MCHC: 32.4 g/dL (ref 30.0–36.0)
MCV: 71.9 fL — ABNORMAL LOW (ref 80.0–100.0)
Platelets: 249 10*3/uL (ref 150–400)
RBC: 5.49 MIL/uL (ref 4.22–5.81)
RDW: 15.7 % — ABNORMAL HIGH (ref 11.5–15.5)
WBC: 4.5 10*3/uL (ref 4.0–10.5)
nRBC: 0 % (ref 0.0–0.2)

## 2023-02-28 LAB — IRON AND TIBC
Iron: 90 ug/dL (ref 45–182)
Saturation Ratios: 26 % (ref 17.9–39.5)
TIBC: 347 ug/dL (ref 250–450)
UIBC: 257 ug/dL

## 2023-02-28 LAB — FERRITIN: Ferritin: 22 ng/mL — ABNORMAL LOW (ref 24–336)

## 2023-02-28 MED ORDER — SENNOSIDES-DOCUSATE SODIUM 8.6-50 MG PO TABS: 1.0000 | ORAL_TABLET | Freq: Every evening | ORAL | Status: DC | PRN

## 2023-02-28 MED ORDER — NICOTINE 14 MG/24HR TD PT24
14.0000 mg | MEDICATED_PATCH | Freq: Every day | TRANSDERMAL | Status: DC
  Administered 2023-02-28 – 2023-03-01 (×2): 14 mg via TRANSDERMAL
  Filled 2023-02-28 (×2): qty 1

## 2023-02-28 MED ORDER — ALBUTEROL SULFATE (2.5 MG/3ML) 0.083% IN NEBU: 2.5000 mg | INHALATION_SOLUTION | Freq: Four times a day (QID) | RESPIRATORY_TRACT | Status: DC | PRN

## 2023-02-28 MED ORDER — ACETAMINOPHEN 650 MG RE SUPP: 650.0000 mg | Freq: Four times a day (QID) | RECTAL | Status: DC | PRN

## 2023-02-28 MED ORDER — OXYCODONE HCL 5 MG PO TABS
5.0000 mg | ORAL_TABLET | Freq: Once | ORAL | Status: AC
  Administered 2023-02-28: 5 mg via ORAL
  Filled 2023-02-28: qty 1

## 2023-02-28 MED ORDER — DEXAMETHASONE SODIUM PHOSPHATE 4 MG/ML IJ SOLN
4.0000 mg | Freq: Four times a day (QID) | INTRAMUSCULAR | Status: DC
  Administered 2023-02-28 – 2023-03-01 (×6): 4 mg via INTRAVENOUS
  Filled 2023-02-28 (×6): qty 1

## 2023-02-28 MED ORDER — METHOCARBAMOL 1000 MG/10ML IJ SOLN: 500.0000 mg | Freq: Four times a day (QID) | INTRAVENOUS | Status: DC | PRN

## 2023-02-28 MED ORDER — OXYCODONE HCL 5 MG PO TABS
5.0000 mg | ORAL_TABLET | ORAL | Status: DC | PRN
  Administered 2023-02-28 – 2023-03-01 (×4): 5 mg via ORAL
  Filled 2023-02-28 (×4): qty 1

## 2023-02-28 MED ORDER — ALBUTEROL SULFATE HFA 108 (90 BASE) MCG/ACT IN AERS: 2.0000 | INHALATION_SPRAY | Freq: Four times a day (QID) | RESPIRATORY_TRACT | Status: DC | PRN

## 2023-02-28 MED ORDER — METOPROLOL SUCCINATE ER 25 MG PO TB24
25.0000 mg | ORAL_TABLET | Freq: Every day | ORAL | Status: DC
  Administered 2023-02-28 – 2023-03-01 (×2): 25 mg via ORAL
  Filled 2023-02-28 (×2): qty 1

## 2023-02-28 MED ORDER — OXYCODONE-ACETAMINOPHEN 10-325 MG PO TABS: 1.0000 | ORAL_TABLET | ORAL | Status: DC | PRN

## 2023-02-28 MED ORDER — ONDANSETRON HCL 4 MG PO TABS: 4.0000 mg | ORAL_TABLET | Freq: Four times a day (QID) | ORAL | Status: DC | PRN

## 2023-02-28 MED ORDER — DULOXETINE HCL 60 MG PO CPEP
60.0000 mg | ORAL_CAPSULE | Freq: Every day | ORAL | Status: DC
  Administered 2023-02-28 – 2023-03-01 (×2): 60 mg via ORAL
  Filled 2023-02-28 (×2): qty 1

## 2023-02-28 MED ORDER — ATORVASTATIN CALCIUM 10 MG PO TABS: 20.0000 mg | ORAL_TABLET | Freq: Every day | ORAL | Status: DC

## 2023-02-28 MED ORDER — METHOCARBAMOL 500 MG PO TABS: 500.0000 mg | ORAL_TABLET | Freq: Three times a day (TID) | ORAL | Status: DC | PRN

## 2023-02-28 MED ORDER — RIVAROXABAN 20 MG PO TABS
20.0000 mg | ORAL_TABLET | Freq: Every day | ORAL | Status: DC
  Administered 2023-02-28: 20 mg via ORAL
  Filled 2023-02-28 (×2): qty 1

## 2023-02-28 MED ORDER — OXYCODONE-ACETAMINOPHEN 5-325 MG PO TABS
1.0000 | ORAL_TABLET | ORAL | Status: DC | PRN
  Administered 2023-02-28 – 2023-03-01 (×6): 1 via ORAL
  Filled 2023-02-28 (×6): qty 1

## 2023-02-28 MED ORDER — METHOCARBAMOL 500 MG PO TABS
500.0000 mg | ORAL_TABLET | Freq: Four times a day (QID) | ORAL | Status: DC | PRN
  Administered 2023-02-28 – 2023-03-01 (×3): 500 mg via ORAL
  Filled 2023-02-28 (×3): qty 1

## 2023-02-28 MED ORDER — DULOXETINE HCL 20 MG PO CPEP
20.0000 mg | ORAL_CAPSULE | Freq: Every day | ORAL | Status: DC
  Administered 2023-02-28 – 2023-03-01 (×2): 20 mg via ORAL
  Filled 2023-02-28 (×2): qty 1

## 2023-02-28 MED ORDER — TAMSULOSIN HCL 0.4 MG PO CAPS
0.4000 mg | ORAL_CAPSULE | Freq: Every day | ORAL | Status: DC
  Administered 2023-02-28 – 2023-03-01 (×2): 0.4 mg via ORAL
  Filled 2023-02-28 (×2): qty 1

## 2023-02-28 MED ORDER — MORPHINE SULFATE (PF) 2 MG/ML IV SOLN
1.0000 mg | INTRAVENOUS | Status: AC | PRN
  Administered 2023-02-28 (×2): 1 mg via INTRAVENOUS
  Filled 2023-02-28 (×2): qty 1

## 2023-02-28 MED ORDER — TRAZODONE HCL 50 MG PO TABS
50.0000 mg | ORAL_TABLET | Freq: Every evening | ORAL | Status: DC | PRN
  Administered 2023-02-28: 50 mg via ORAL
  Filled 2023-02-28: qty 1

## 2023-02-28 MED ORDER — ONDANSETRON HCL 4 MG/2ML IJ SOLN: 4.0000 mg | Freq: Four times a day (QID) | INTRAMUSCULAR | Status: DC | PRN

## 2023-02-28 MED ORDER — ACETAMINOPHEN 325 MG PO TABS
650.0000 mg | ORAL_TABLET | Freq: Four times a day (QID) | ORAL | Status: DC | PRN
  Administered 2023-02-28 – 2023-03-01 (×2): 650 mg via ORAL
  Filled 2023-02-28 (×2): qty 2

## 2023-02-28 MED ORDER — MORPHINE SULFATE (PF) 2 MG/ML IV SOLN
1.0000 mg | INTRAVENOUS | Status: AC | PRN
  Administered 2023-02-28 – 2023-03-01 (×2): 1 mg via INTRAVENOUS
  Filled 2023-02-28 (×2): qty 1

## 2023-02-28 NOTE — ED Provider Notes (Signed)
Blood pressure 138/79, pulse 66, temperature 98.1 F (36.7 C), temperature source Oral, resp. rate 16, height 6\' 4"  (1.93 m), weight 89.8 kg, SpO2 100 %.  Assuming care from Dr. Julian Reil.  In short, [name] [name] is a 72 y.o. male with a chief complaint of Back Pain and Weakness .  Refer to the original H&P for additional details.  The current plan of care is to follow with NSG.  12:32 AM  Symptoms not consistent with cauda equina syndrome. Imaging reviewed and discussed with NSG team at [facility]. They are unable to accept patient in transfer due to capacity. NSG team here will consult in the AM. Recommend pain mgmt, steroid, and PT/OT. No plan for emergent surgery.   Discussed patient's case with [facility] to request admission. Patient and family (if present) updated with plan.   I reviewed all nursing notes, vitals, pertinent old records, EKGs, labs, imaging (as available).    Maia Plan, MD 02/28/23 [phone]

## 2023-02-28 NOTE — H&P (Signed)
History and Physical    WILBERTH DAMON NWG:[id number] DOB: 1951-01-04 DOA: 02/27/2023  PCP: [name], MD  Patient coming from: Home  I have personally briefly reviewed patient's old medical records in [facility]  Chief Complaint: Low back pain, difficulty walking  HPI: [name] [name] is a 72 y.o. male with medical history significant for HTN, HLD, history of DVT on Xarelto, BPH, chronic back pain with prior L4-5 TLIF who presented to the ED for evaluation of lower back pain and difficulty walking.  Patient reports chronic low back pain.  Over the last week he has been having worsening lower back pain.  He says 1 week ago when he was working in his garage he bent to pick something up when he developed sudden pain to his lower back.  He has had difficulty getting around since then.  He feels like his legs have been weaker than usual and have been giving out under him.  This has felt worse in his left leg.  He has also had shooting pains down both of his legs.  He denies any loss of bowel or bladder control.  He reports good urine output without dysuria.  Patient does note seeing occasional red blood mixed in with his stools, last time was about 1 week ago.  He has been on Xarelto for prior history of DVT.  He also notes unintentional weight loss over the last year from 270 pounds down to around 190 pounds.  ED Course  Labs/Imaging on admission: I have personally reviewed following labs and imaging studies.  Initial vitals showed BP 137/81, pulse 68, RR 18, temp 98.0 F, SpO2 98% on room air.  Labs show WBC 3.2, hemoglobin 12.9, platelets 240,000, sodium 137, potassium 3.7, bicarb 29, BUN 10, creatinine 0.95, serum glucose 92.  MRI L-spine with and without contrast showed slightly worsened severe spinal canal stenosis at L3-L4 with cauda equina impingement.  L4-5 posterior fusion without residual spinal canal or neural foraminal stenosis reported.  Left and right knee x-ray  showed mild to moderate bilateral degenerative changes.  No joint effusion.  EDP discussed with NP on-call for neurosurgery Val Eagle.  Per their conversation neurosurgery team did not feel this was cauda equina syndrome.  Recommendation was for steroids with no immediate plans for surgical intervention.  They will team will see in AM.  The hospitalist service was consulted to admit due to persistent back pain and difficulty safely ambulating.  Review of Systems: All systems reviewed and are negative except as documented in history of present illness above.   Past Medical History:  Diagnosis Date   Anxiety and depression    Back pain    Diverticulitis    Diverticulosis    DVT (deep venous thrombosis) (HCC)    Gout    Hernia    History of blood clots    Hypertension    Lesion of right native kidney 01/03/2023   Nausea and vomiting    Neuropathy, peripheral 05/15/2012   patient denies   Shingles     Past Surgical History:  Procedure Laterality Date   APPENDECTOMY     BACK SURGERY     CHOLECYSTECTOMY  09/09/2006   HERNIA REPAIR     HIATAL HERNIA REPAIR     IR ANGIO INTRA EXTRACRAN SEL INTERNAL CAROTID BILAT MOD SED  01/18/2020   IR ANGIO VERTEBRAL SEL VERTEBRAL BILAT MOD SED  01/18/2020   IR US GUIDE VASC ACCESS RIGHT  01/18/2020  LEFT HEART CATHETERIZATION WITH CORONARY ANGIOGRAM N/A 08/02/2014   Procedure: LEFT HEART CATHETERIZATION WITH CORONARY ANGIOGRAM;  Surgeon: Pamella Pert, MD;  Location: [facility] CATH LAB;  Service: Cardiovascular;  Laterality: N/A;   MULTIPLE EXTRACTIONS WITH ALVEOLOPLASTY N/A 10/19/2015   Procedure: Extraction of tooth #'s 2,12,13 with alveoloplasty;  Surgeon: Charlynne Pander, DDS;  Location: [facility];  Service: Oral Surgery;  Laterality: N/A;   SACRAL NERVE STIMULATOR PLACEMENT  09/09/2009   [employer]ore[employer]ED [phone] MRI info-[email or web address]    Social History:  reports that he  has been smoking cigarettes. He has a 20.00 pack-year smoking history. He has never used smokeless tobacco. He reports current alcohol use. He reports current drug use. Drug: Marijuana.  Allergies  Allergen Reactions   Aspirin Other (See Comments)    ulcers   Celecoxib Other (See Comments)    Stomach irritation   Ibuprofen Other (See Comments)    ulcers   Lyrica [Pregabalin] Swelling   Nsaids    Vicodin [Hydrocodone-Acetaminophen]     Makes me mean     Family History  Problem Relation Age of Onset   Prostate cancer Father    Heart disease Father    Diabetes Mother    Heart disease Mother    Colon cancer Maternal Uncle        dx in his 23's   Pancreatic cancer Paternal Uncle    Prostate cancer Paternal Uncle    Multiple sclerosis Daughter    Prostate cancer Paternal Uncle    Esophageal cancer Neg Hx    Rectal cancer Neg Hx    Stomach cancer Neg Hx      Prior to Admission medications   Medication Sig Start Date End Date Taking? Authorizing Provider  methylPREDNISolone (MEDROL DOSEPAK) 4 MG TBPK tablet Day 1: 8mg  before breakfast, 4 mg after lunch, 4 mg after supper, and 8 mg at bedtime Day 2: 4 mg before breakfast, 4 mg after lunch, 4 mg  after supper, and 8 mg  at bedtime Day 3:  4 mg  before breakfast, 4 mg  after lunch, 4 mg after supper, and 4 mg  at bedtime Day 4: 4 mg  before breakfast, 4 mg  after lunch, and 4 mg at bedtime Day 5: 4 mg  before breakfast and 4 mg at bedtime Day 6: 4 mg  before breakfast 02/27/23  Yes Melene Plan, DO  albuterol (PROVENTIL HFA;VENTOLIN HFA) 108 (90 Base) MCG/ACT inhaler Inhale 2 puffs into the lungs every 6 (six) hours as needed for wheezing or shortness of breath. 01/27/17   Derwood Kaplan, MD  ARIPiprazole (ABILIFY) 5 MG tablet Start with 1/2 tablet daily at bedtime for 7-10 days, then increase to 1 tablet daily at bedtime. 01/02/23   [name], MD  atorvastatin (LIPITOR) 20 MG tablet Take by mouth. 10/18/21   [provider]   baclofen (LIORESAL) 10 MG tablet Take 10 mg by mouth 3 (three) times daily.    [provider]  DULoxetine (CYMBALTA) 20 MG capsule Take 1 capsule (20 mg total) by mouth daily. 01/02/23   [name], MD  DULoxetine (CYMBALTA) 60 MG capsule Take 120 mg by mouth daily.     [provider]  metoprolol tartrate (LOPRESSOR) 25 MG tablet Take 50 mg by mouth 2 (two) times daily.  07/29/14   [provider]  NIFEDICAL XL 60 MG 24 hr tablet Take 120 mg by mouth daily.  02/21/14   [provider]  NIFEdipine (PROCARDIA  XL/NIFEDICAL XL) 60 MG 24 hr tablet Take 2 tablets by mouth daily. 03/13/20   [provider]  oxyCODONE-acetaminophen (PERCOCET) 10-325 MG tablet Take 1 tablet by mouth every 4 (four) hours as needed.    [provider]  rivaroxaban (XARELTO) 20 MG TABS tablet Take 1 tablet (20 mg total) by mouth daily with supper. Please follow up with Primary Care MD for future refills 10/19/19   Johney Maine, MD  SYMBICORT 160-4.5 MCG/ACT inhaler INHALE 2 PUFFS BY MOUTH IN THE MORNING AND IN THE EVENING AND 12 HOURS APART 02/03/22   [provider]  tamsulosin (FLOMAX) 0.4 MG CAPS capsule Take 1 capsule (0.4 mg total) by mouth daily. 01/08/23   Stoneking, Danford Bad., MD  VOLTAREN 1 % GEL Apply 2 g topically daily as needed (pain). For neck and shoulder pain 02/23/14   [provider]    Physical Exam: Vitals:   02/27/23 2300 02/27/23 2335 02/28/23 0000 02/28/23 0127  BP: (!) 137/110 (!) 149/93 138/79   Pulse:   66   Resp:   16   Temp:    97.6 F (36.4 C)  TempSrc:    Oral  SpO2:   100%   Weight:      Height:       Constitutional: Resting in bed, NAD, calm, comfortable Eyes: EOMI, lids and conjunctivae normal ENMT: Mucous membranes are moist. Posterior pharynx clear of any exudate or lesions.Normal dentition.  Neck: normal, supple, no masses. Respiratory: clear to auscultation bilaterally, no wheezing, no crackles.  Normal respiratory effort. No accessory muscle use.  Cardiovascular: Regular rate and rhythm, no murmurs / rubs / gallops. No extremity edema. 2+ pedal pulses. Abdomen: no tenderness, no masses palpated.  Musculoskeletal: no clubbing / cyanosis. No joint deformity upper and lower extremities. No contractures. Normal muscle tone.  Skin: no rashes, lesions, ulcers. No induration Neurologic: CN 2-12 grossly intact. Sensation intact. Strength 5/5 both upper extremities.  Strength largely 5/5 both lower extremities although ROM limited due to lower back pain. Psychiatric: Alert and oriented x 3. Normal mood.   EKG: Not performed.  Assessment/Plan Principal Problem:   Spinal stenosis of lumbar region Active Problems:   Microcytic anemia   Essential hypertension   History of DVT (deep vein thrombosis)   BPH with obstruction/lower urinary tract symptoms   Hyperlipidemia   Depression with anxiety   EMMET MESSER is a 71 y.o. male with medical history significant for HTN, HLD, history of DVT on Xarelto, BPH, chronic back pain with prior L4-5 TLIF who is admitted with severe spinal stenosis L3-4 resulting in uncontrolled pain and difficulty ambulating.  Neurosurgery to consult.  Assessment and Plan: * Spinal stenosis of lumbar region Presenting with 1 week worsening lower back pain and difficulty ambulating.  MRI L-spine shows severe spinal canal stenosis at L3-L4 with cauda equina impingement.  EDP discussed with NP on-call for neurosurgery who felt presentation was not consistent with cauda equina syndrome.  Recommendation was for pain control, steroids, and they will see in consultation here in the morning. -Continue Decadron -Continue analgesics as needed -PT/OT eval -Further recommendations per neurosurgery  Microcytic anemia Chronic microcytic anemia with hemoglobin stable at 12.9.  Patient does report intermittent blood in his stool.  He has been on chronic anticoagulation with Xarelto.   He reports unintentional weight loss of about 80 pounds over the last year.  Last colonoscopy 06/2019 with Dr. Myrtie Neither showed 1 cecal polyp which was resected.  Pathology showed tubular  adenoma.  Felt to have benign bleeding at that time due to oral anticoagulation. -Check FOBT, ferritin, iron/TIBC -Monitor for recurrent bleeding while on Xarelto  History of DVT (deep vein thrombosis) Continue Xarelto.  Essential hypertension BP stable.  Resume home meds pending medication reconciliation.  Depression with anxiety Resume home meds pending medication reconciliation.  Hyperlipidemia Continue atorvastatin.  BPH with obstruction/lower urinary tract symptoms Continue Flomax.  DVT prophylaxis:  rivaroxaban (XARELTO) tablet 20 mg  Code Status: Full code, confirmed with patient on admission Family Communication: Discussed with patient, he has discussed with family Disposition Plan: From home, dispo pending clinical progress Consults called: Neurosurgery Severity of Illness: The appropriate patient status for this patient is OBSERVATION. Observation status is judged to be reasonable and necessary in order to provide the required intensity of service to ensure the patient's safety. The patient's presenting symptoms, physical exam findings, and initial radiographic and laboratory data in the context of their medical condition is felt to place them at decreased risk for further clinical deterioration. Furthermore, it is anticipated that the patient will be medically stable for discharge from the hospital within 2 midnights of admission.   Darreld Mclean MD [facility]  If 7PM-7AM, please contact night-coverage [email or web address]  02/28/2023, 1:39 AM
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
## 2023-02-28 NOTE — Assessment & Plan Note (Signed)
Presenting with 1 week worsening lower back pain and difficulty ambulating.  MRI L-spine shows severe spinal canal stenosis at L3-L4 with cauda equina impingement.  EDP discussed with NP on-call for neurosurgery who felt presentation was not consistent with cauda equina syndrome.  Recommendation was for pain control, steroids, and they will see in consultation here in the morning. -Continue Decadron -Continue analgesics as needed -PT/OT eval -Further recommendations per neurosurgery

## 2023-02-28 NOTE — Plan of Care (Signed)
  Problem: Education: Goal: Knowledge of General Education information will improve Description: Including pain rating scale, medication(s)/side effects and non-pharmacologic comfort measures Outcome: Progressing   Problem: Health Behavior/Discharge Planning: Goal: Ability to manage health-related needs will improve Outcome: Progressing   Problem: Clinical Measurements: Goal: Ability to maintain clinical measurements within normal limits will improve Outcome: Progressing Goal: Will remain free from infection Outcome: Progressing Goal: Diagnostic test results will improve Outcome: Progressing   Problem: Activity: Goal: Risk for activity intolerance will decrease Outcome: Progressing   Problem: Nutrition: Goal: Adequate nutrition will be maintained Outcome: Progressing   Problem: Elimination: Goal: Will not experience complications related to bowel motility Outcome: Progressing   Problem: Safety: Goal: Ability to remain free from injury will improve Outcome: Progressing   Problem: Skin Integrity: Goal: Risk for impaired skin integrity will decrease Outcome: Progressing   

## 2023-02-28 NOTE — Progress Notes (Signed)
Progress Note   Patient: [name] [name] ZOX:[id number] DOB: [birth year] DOA: 02/27/2023     0 DOS: the patient was seen and examined on 02/28/2023   Brief hospital course: 72 year old male PMH spinal stenosis, status post L4-5 TLIF, presented with low back pain and difficulty walking, acute on chronic back pain over the last week.  Imaging of the lumbar spine showed slightly worsening severe canal stenosis L4-5 with cauda equina impingement.  Neurosurgery reviewed chart and recommended outpatient evaluation.  Admitted for pain control.  Consultants Neurosurgery  Procedures None   Assessment and Plan: * Spinal stenosis of lumbar region Presenting with 1 week worsening lower back pain and difficulty ambulating.  MRI L-spine shows severe spinal canal stenosis at L3-L4 with cauda equina impingement.   Neurosurgery put a note in the chart recommending outpatient evaluation, no signs or symptoms of cauda equina at that point. I discussed the case with Dr. Lovell Sheehan today in detail.  No concern for cauda equina syndrome.  Did recommend checking postvoid residual and follow-up with Dr. Noel Gerold as an outpatient.  Microcytic anemia Chronic microcytic anemia with hemoglobin stable at 12.9.   Last colonoscopy 06/2019 with Dr. Myrtie Neither showed 1 cecal polyp which was resected.  Pathology showed tubular adenoma.  Felt to have benign bleeding at that time due to oral anticoagulation. Hemoglobin stable.  Follow-up as an outpatient.  Could consider IV iron as an outpatient.  History of DVT (deep vein thrombosis) Continue Xarelto.  Essential hypertension BP stable.  Depression with anxiety  Hyperlipidemia Continue atorvastatin.  BPH with obstruction/lower urinary tract symptoms Continue Flomax.     Subjective:  Back and leg pain worse today Very painful to move Reports pain/numbness buttocks, L>R, left upper leg. Urinary urge/frequency but no loss of control, no bowel incontinence  Physical  Exam: Vitals:   02/28/23 0303 02/28/23 0639 02/28/23 0932 02/28/23 1411  BP: (!) 150/87 (!) 161/90 (!) 170/97 (!) 166/90  Pulse: 63 69 69 71  Resp: 18 18 16 18   Temp: 97.7 F (36.5 C) 98.6 F (37 C) 97.8 F (36.6 C) 98.3 F (36.8 C)  TempSrc:      SpO2: 100% 99% 100% 97%  Weight:      Height:       Physical Exam Vitals reviewed.  Constitutional:      General: He is not in acute distress.    Appearance: He is not ill-appearing or toxic-appearing.  Cardiovascular:     Rate and Rhythm: Normal rate and regular rhythm.     Heart sounds: No murmur heard. Pulmonary:     Effort: Pulmonary effort is normal. No respiratory distress.     Breath sounds: No wheezing, rhonchi or rales.  Musculoskeletal:     Right lower leg: No edema.     Left lower leg: No edema.     Comments: Able to lift both legs off bed, right > left  Neurological:     Mental Status: He is alert.  Psychiatric:        Mood and Affect: Mood normal.        Behavior: Behavior normal.     Data Reviewed: AFVSS BMP, CBC noted X-rays both knees NAD X-ray lumbar spine NAD MR lumbar spine: Slightly worsened severe spinal canal stenosis at L3-L4 with cauda equina impingement. 2. L4-5 posterior fusion without residual spinal canal or neural foraminal stenosis.  Family Communication: none  Disposition: Status is: Observation   Planned Discharge Destination:  TBD    Time spent: 35 minutes  Author: Brendia Sacks, MD 02/28/2023 3:10 PM  For on call review [email or web address].

## 2023-02-28 NOTE — ED Notes (Signed)
Attempted to perform bladder scan x4 attempts with this nurse unsuccessful at this time. Dr. Adela Lank notified.

## 2023-02-28 NOTE — Assessment & Plan Note (Signed)
Continue atorvastatin
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
## 2023-02-28 NOTE — Assessment & Plan Note (Addendum)
Chronic microcytic anemia with hemoglobin stable at 12.9.  Patient does report intermittent blood in his stool.  He has been on chronic anticoagulation with Xarelto.  He reports unintentional weight loss of about 80 pounds over the last year.  Last colonoscopy 06/2019 with Dr. Myrtie Neither showed 1 cecal polyp which was resected.  Pathology showed tubular adenoma.  Felt to have benign bleeding at that time due to oral anticoagulation. -Check FOBT, ferritin, iron/TIBC -Monitor for recurrent bleeding while on Xarelto

## 2023-02-28 NOTE — Assessment & Plan Note (Signed)
Continue Xarelto 

## 2023-02-28 NOTE — Assessment & Plan Note (Signed)
BP stable.  Resume home meds pending medication reconciliation.

## 2023-02-28 NOTE — Assessment & Plan Note (Signed)
Resume home meds pending medication reconciliation. 
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
## 2023-02-28 NOTE — Hospital Course (Addendum)
72 year old male PMH spinal stenosis, status post L4-5 TLIF, presented with low back pain and difficulty walking, acute on chronic back pain over the last week.  Imaging of the lumbar spine showed slightly worsening severe canal stenosis L4-5 with cauda equina impingement.  Neurosurgery reviewed chart and recommended outpatient evaluation.  Admitted for pain control.  Consultants Neurosurgery  Procedures None

## 2023-02-28 NOTE — TOC Initial Note (Signed)
Transition of Care [facility]) - Initial/Assessment Note   Patient Details  Name: [name] [name] MRN: [id number] Date of Birth: November 09, 1950  [facility]) CM/SW Contact:    Ewing Schlein, LCSW Phone Number: 02/28/2023, 11:58 AM  Clinical Narrative: SDOH screening indicated food insecurity and patient is agreeable to having food pantry resources added to AVS. CSW added resources to AVS. TOC awaiting PT evaluation.  Expected Discharge Plan: Home w Home Health Services Barriers to Discharge: Continued Medical Work up  Patient Goals and CMS Choice Patient states their goals for this hospitalization and ongoing recovery are:: Get assistance with food insecurity  Expected Discharge Plan and Services In-house Referral: Clinical Social Work Living arrangements for the past 2 months: Apartment  Prior Living Arrangements/Services Living arrangements for the past 2 months: Apartment Lives with:: Spouse Patient language and need for interpreter reviewed:: Yes Do you feel safe going back to the place where you live?: Yes      Need for Family Participation in Patient Care: No (Comment) Care giver support system in place?: Yes (comment) Criminal Activity/Legal Involvement Pertinent to Current Situation/Hospitalization: No - Comment as needed  Activities of Daily Living Home Assistive Devices/Equipment: Eyeglasses (cane) ADL Screening (condition at time of admission) Patient's cognitive ability adequate to safely complete daily activities?: Yes Is the patient deaf or have difficulty hearing?: No Does the patient have difficulty seeing, even when wearing glasses/contacts?: No Does the patient have difficulty concentrating, remembering, or making decisions?: No Patient able to express need for assistance with ADLs?: Yes Does the patient have difficulty dressing or bathing?: No Independently performs ADLs?: No Communication: Independent Dressing (OT): Independent Grooming:  Independent Feeding: Independent Bathing: Needs assistance Is this a change from baseline?: Pre-admission baseline Toileting: Needs assistance Is this a change from baseline?: Pre-admission baseline In/Out Bed: Needs assistance Is this a change from baseline?: Pre-admission baseline Walks in Home: Independent with device (comment) Does the patient have difficulty walking or climbing stairs?: No Weakness of Legs: Both Weakness of Arms/Hands: None  Emotional Assessment Appearance:: Appears stated age Attitude/Demeanor/Rapport: Engaged Affect (typically observed): Accepting, Pleasant Orientation: : Oriented to Self, Oriented to Place, Oriented to  Time, Oriented to Situation Alcohol / Substance Use: Not Applicable Psych Involvement: No (comment)  Admission diagnosis:  Spinal stenosis of lumbar region [M48.061] Spinal stenosis of lumbar region with neurogenic claudication [M48.062] Patient Active Problem List   Diagnosis Date Noted   Spinal stenosis of lumbar region 02/28/2023   Essential hypertension 02/28/2023   History of DVT (deep vein thrombosis) 02/28/2023   Hyperlipidemia 02/28/2023   Depression with anxiety 02/28/2023   Microcytic anemia 02/28/2023   Renal mass; right 01/08/2023   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   BPH with obstruction/lower urinary tract symptoms 01/08/2023   Chronic prostatitis 01/08/2023   Lesion of right native kidney 01/03/2023   Tick bite of right thigh 01/03/2023   Moderate episode of recurrent major depressive disorder (HCC) 01/03/2023   Right flank pain 05/01/2021   Chronic right-sided back pain 05/01/2021   Chronic diarrhea 05/01/2021   Primary osteoarthritis of left knee 03/29/2020   Primary osteoarthritis of right knee 07/08/2017   Complete rotator cuff tear of left shoulder 10/21/2016   Gout attack 04/02/2016   Angina pectoris (HCC) 08/01/2014   Hemorrhage of rectum and anus 03/28/2014   Neuropathy, peripheral 05/15/2012   Pulmonary  embolism (HCC) 07/11/2011   DVT (deep venous thrombosis) (HCC) 07/11/2011   Abdominal pain, left lower quadrant 01/29/2011   Long term current use  of anticoagulant therapy 01/29/2011   Diarrhea 01/29/2011   PCP:  [name], MD Pharmacy:   [facility] [address] - [place], [place] - [address] [place] [address] Phone: [phone] Fax: [phone]  [facility] [phone] [place], [place] - [address] AT [facility] [address] [place] [address] Phone: [phone] Fax: [phone]  [id number] [place], [place] - [address] AT [facility] & [address] [place] [address] Phone: [phone] Fax: [phone]  Social Determinants of Health (SDOH) Social History: SDOH Screenings   Food Insecurity: Food Insecurity Present (02/28/2023)  Housing: Medium Risk (02/28/2023)  Transportation Needs: No Transportation Needs (02/28/2023)  Utilities: Not At Risk (02/28/2023)  Alcohol Screen: Low Risk  (02/26/2023)  Depression (PHQ2-9): High Risk (01/02/2023)  Financial Resource Strain: Low Risk  (02/26/2023)  Social Connections: Moderately Isolated (02/26/2023)  Tobacco Use: High Risk (02/27/2023)   SDOH Interventions: Food Insecurity Interventions: Inpatient TOC, Other (Comment) (Food pantry information added to AVS.)  Readmission Risk Interventions     No data to display

## 2023-02-28 NOTE — Assessment & Plan Note (Signed)
-   Continue Flomax 

## 2023-03-01 DIAGNOSIS — M48062 Spinal stenosis, lumbar region with neurogenic claudication: Secondary | ICD-10-CM | POA: Diagnosis not present

## 2023-03-01 DIAGNOSIS — D509 Iron deficiency anemia, unspecified: Secondary | ICD-10-CM | POA: Diagnosis not present

## 2023-03-01 MED ORDER — METHYLPREDNISOLONE 4 MG PO TBPK: ORAL_TABLET | ORAL | 0 refills | Status: DC
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
## 2023-03-01 NOTE — Discharge Summary (Signed)
Physician Discharge Summary   Patient: [name] [name] MRN: [id number] DOB: November 13, 1950  Admit date:     02/27/2023  Discharge date: 03/01/23  Discharge Physician: Brendia Sacks   PCP: [name], MD   Recommendations at discharge:   * Spinal stenosis of lumbar region Presented with 1 week worsening lower back pain and difficulty ambulating.  MRI L-spine shows severe spinal canal stenosis at L3-L4 with cauda equina impingement.   Follow-up with Dr. Noel Gerold as an outpatient.  Patient will make arrangements next week.  The office was closed on Friday. Home health arranged   Microcytic anemia Hemoglobin stable.  Follow-up as an outpatient.  Could consider IV iron as an outpatient.  Discharge Diagnoses: Principal Problem:   Spinal stenosis of lumbar region Active Problems:   Microcytic anemia   Essential hypertension   History of DVT (deep vein thrombosis)   BPH with obstruction/lower urinary tract symptoms   Hyperlipidemia   Depression with anxiety  Resolved Problems:   * No resolved hospital problems. *  Hospital Course: 72 year old male PMH spinal stenosis, status post L4-5 TLIF, presented with low back pain and difficulty walking, acute on chronic back pain over the last week.  Imaging of the lumbar spine showed slightly worsening severe canal stenosis L4-5 with cauda equina impingement.  Neurosurgery reviewed chart and recommended outpatient evaluation.  Admitted for pain control.  Condition improved spontaneously and patient was discharged home in good condition.  Consultants Neurosurgery  Procedures None   * Spinal stenosis of lumbar region Presented with 1 week worsening lower back pain and difficulty ambulating.  MRI L-spine shows severe spinal canal stenosis at L3-L4 with cauda equina impingement.   Neurosurgery put a note in the chart recommending outpatient evaluation, no signs or symptoms of cauda equina at that point. I discussed the case with Dr. Lovell Sheehan  6/21.  No concern for cauda equina syndrome.  Did recommend checking postvoid residual which was unremarkable and follow-up with Dr. Noel Gerold as an outpatient. 6/22 patient better, ambulating better.  Pain controlled.  Seen by therapy with recommendation for home health.   Discharged home in good condition.   Microcytic anemia Chronic microcytic anemia with hemoglobin stable at 12.9.   Last colonoscopy 06/2019 with Dr. Myrtie Neither showed 1 cecal polyp which was resected.  Pathology showed tubular adenoma.  Felt to have benign bleeding at that time due to oral anticoagulation. Hemoglobin stable.  Follow-up as an outpatient.  Could consider IV iron as an outpatient.   History of DVT (deep vein thrombosis) Continue Xarelto.   Essential hypertension BP stable.   Depression with anxiety   Hyperlipidemia Continue atorvastatin.   BPH with obstruction/lower urinary tract symptoms Continue Flomax.  Consultants: Neurosurgery Procedures performed: None  Disposition: Home health Diet recommendation:  Regular diet DISCHARGE MEDICATION: Allergies as of 03/01/2023       Reactions   Aspirin Other (See Comments)   ulcers   Celecoxib Other (See Comments)   Stomach irritation   Ibuprofen Other (See Comments)   ulcers   Lyrica [pregabalin] Swelling   Nsaids    Vicodin [hydrocodone-acetaminophen]    Makes me mean         Medication List     STOP taking these medications    ARIPiprazole 5 MG tablet Commonly known as: Abilify       TAKE these medications    albuterol 108 (90 Base) MCG/ACT inhaler Commonly known as: VENTOLIN HFA Inhale 2 puffs into the lungs every 6 (six) hours as  needed for wheezing or shortness of breath.   albuterol (2.5 MG/3ML) 0.083% nebulizer solution Commonly known as: PROVENTIL Take 2.5 mg by nebulization every 6 (six) hours as needed for wheezing or shortness of breath.   DULoxetine 20 MG capsule Commonly known as: CYMBALTA Take 1 capsule (20 mg total) by  mouth daily.   DULoxetine 60 MG capsule Commonly known as: CYMBALTA Take 60 mg by mouth daily.   methylPREDNISolone 4 MG Tbpk tablet Commonly known as: MEDROL DOSEPAK Day 1: 8mg  before breakfast, 4 mg after lunch, 4 mg after supper, and 8 mg at bedtime Day 2: 4 mg before breakfast, 4 mg after lunch, 4 mg  after supper, and 8 mg  at bedtime Day 3:  4 mg  before breakfast, 4 mg  after lunch, 4 mg after supper, and 4 mg  at bedtime Day 4: 4 mg  before breakfast, 4 mg  after lunch, and 4 mg at bedtime Day 5: 4 mg  before breakfast and 4 mg at bedtime Day 6: 4 mg  before breakfast   metoprolol succinate 25 MG 24 hr tablet Commonly known as: TOPROL-XL Take 25 mg by mouth daily.   oxyCODONE-acetaminophen 10-325 MG tablet Commonly known as: PERCOCET Take 1 tablet by mouth every 6 (six) hours as needed for pain.   rivaroxaban 20 MG Tabs tablet Commonly known as: Xarelto Take 1 tablet (20 mg total) by mouth daily with supper. Please follow up with Primary Care MD for future refills   tamsulosin 0.4 MG Caps capsule Commonly known as: FLOMAX Take 1 capsule (0.4 mg total) by mouth daily.               Durable Medical Equipment  (From admission, onward)           Start     Ordered   02/28/23 1805  For home use only DME Walker rolling  Once       Question Answer Comment  Walker: With 5 Inch Wheels   Patient needs a walker to treat with the following condition Difficulty in walking, not elsewhere classified      02/28/23 1804            Follow-up Information     Patricia Nettle, MD. Call in 1 day.   Specialty: Orthopedic Surgery Why: discuss your visit, see when they want to see you in the office Contact information: [address] [place] [address] [phone]         [facility] Follow up.   Contact information: [address] [phone]        [facility] Follow up.   Contact information: [employer] [address] [phone]        [facility] Follow up.   Contact information: [address] [employer] [phone]        [facility] Follow up.   Contact information: [address] [phone]        [facility] Follow up.   Contact information: [address] [place], [place] [address] [phone]        [facility] Follow up.   Why: to provide home physical therapy visits Contact information: [address] [place] [address] [phone]                Feels better Less pain No numbness  No bowel/bladder dysfunction  Discharge Exam: Filed Weights   02/27/23 1734 02/28/23 0252  Weight: 89.8 kg 87.9 kg   Physical Exam Vitals reviewed.  Constitutional:      General: He is not in acute distress.    Appearance: He is not ill-appearing or toxic-appearing.  Cardiovascular:     Rate and Rhythm: Normal rate and regular rhythm.     Heart sounds: No murmur heard. Pulmonary:     Effort: Pulmonary effort is normal. No respiratory distress.     Breath sounds: No wheezing, rhonchi or rales.  Musculoskeletal:     Comments: Stood without assistance.  Neurological:     Mental Status: He is alert.  Psychiatric:        Mood and Affect: Mood normal.        Behavior: Behavior normal.      Condition at discharge: good  The results of significant diagnostics from this hospitalization (including imaging, microbiology, ancillary and laboratory) are listed below for reference.   Imaging Studies: MR Lumbar Spine W Wo Contrast  Result Date: 02/27/2023 CLINICAL DATA:  Low back pain EXAM: MRI LUMBAR SPINE WITHOUT AND WITH CONTRAST TECHNIQUE: Multiplanar and multiecho pulse sequences of the lumbar spine were obtained without and with intravenous contrast. CONTRAST:  8.3mL GADAVIST GADOBUTROL 1 MMOL/ML IV SOLN COMPARISON:  05/20/2022 FINDINGS:  Segmentation:  Standard. Alignment:  Grade 1 retrolisthesis at L3-4 Vertebrae: L4-5 posterior fusion. No acute fracture. L1 hemangioma. Conus medullaris and cauda equina: Conus extends to the L2 level. Buckling of the cauda equina above the L3-4 level. Paraspinal and other soft tissues: Negative Disc levels: L1-L2: Normal disc space and facet joints. No spinal canal stenosis. No neural foraminal stenosis. L2-L3: Small disc bulge with moderate facet hypertrophy. No spinal canal stenosis. No neural foraminal stenosis. L3-L4: Small disc bulge. Slightly worsened severe spinal canal stenosis. Mild bilateral neural foraminal stenosis. L4-L5: Postfusion changes. No spinal canal stenosis. No neural foraminal stenosis. L5-S1: Normal disc space and facet joints. No spinal canal stenosis. No neural foraminal stenosis. Visualized sacrum: Normal. IMPRESSION: 1. Slightly worsened severe spinal canal stenosis at L3-L4 with cauda equina impingement. 2. L4-5 posterior fusion without residual spinal canal or neural foraminal stenosis. Electronically Signed   By: Deatra Robinson M.D.   On: 02/27/2023 20:58   DG Lumbar Spine Complete  Result Date: 02/27/2023 CLINICAL DATA:  Low back pain.  Fall on Saturday. EXAM: LUMBAR SPINE - COMPLETE 4+ VIEW COMPARISON:  Radiograph 11/28/2021 my MRI 05/20/2022 FINDINGS: Five non-rib-bearing lumbar vertebra. Posterior rod with intrapedicular screw fusion and interbody spacer at L4-L5. Hardware is intact. Unchanged trace anterolisthesis of L4 on L5. Alignment is otherwise normal. No evidence of fracture or compression deformity. Multilevel degenerative disc disease and facet hypertrophy. IVC filter is partially included in the field of view. IMPRESSION: 1. No acute fracture or subluxation of the lumbar spine. 2. Posterior fusion at L4-L5 without hardware complication. 3. Multilevel degenerative change. Electronically Signed   By: Narda Rutherford M.D.   On: 02/27/2023 17:34   DG Knee Complete 4  Views Left  Result Date: 02/27/2023 CLINICAL DATA:  Leg pain. EXAM: LEFT KNEE - COMPLETE 4+ VIEW; RIGHT KNEE - COMPLETE 4 VIEW COMPARISON:  None Available. FINDINGS: Left knee: No fracture or dislocation. Slight joint space loss of the patellofemoral joint with moderate osteophytes. Small osteophytes of the lateral compartment. No joint effusion on lateral view. Mild hyperostosis along the patella. Chondrocalcinosis. Right knee: Osteopenia. Small osteophytes seen of all 3 compartments, greatest of  the patellofemoral joint and lateral compartment. Slight joint space loss of the medial compartment. No joint effusion. Hyperostosis along the patella. Slight chondrocalcinosis. IMPRESSION: Mild-to-moderate bilateral degenerative changes.  No joint effusion. Chondrocalcinosis. Electronically Signed   By: Karen Kays M.D.   On: 02/27/2023 17:34   DG Knee Complete 4 Views Right  Result Date: 02/27/2023 CLINICAL DATA:  Leg pain. EXAM: LEFT KNEE - COMPLETE 4+ VIEW; RIGHT KNEE - COMPLETE 4 VIEW COMPARISON:  None Available. FINDINGS: Left knee: No fracture or dislocation. Slight joint space loss of the patellofemoral joint with moderate osteophytes. Small osteophytes of the lateral compartment. No joint effusion on lateral view. Mild hyperostosis along the patella. Chondrocalcinosis. Right knee: Osteopenia. Small osteophytes seen of all 3 compartments, greatest of the patellofemoral joint and lateral compartment. Slight joint space loss of the medial compartment. No joint effusion. Hyperostosis along the patella. Slight chondrocalcinosis. IMPRESSION: Mild-to-moderate bilateral degenerative changes.  No joint effusion. Chondrocalcinosis. Electronically Signed   By: Karen Kays M.D.   On: 02/27/2023 17:34   CT RENAL ABDOMEN W WO CONTRAST  Result Date: 02/21/2023 CLINICAL DATA:  Right renal mass, weight loss EXAM: CT ABDOMEN WITHOUT AND WITH CONTRAST TECHNIQUE: Multidetector CT imaging of the abdomen was performed  following the standard protocol before and following the bolus administration of intravenous contrast. RADIATION DOSE REDUCTION: This exam was performed according to the departmental dose-optimization program which includes automated exposure control, adjustment of the mA and/or kV according to patient size and/or use of iterative reconstruction technique. CONTRAST:  OMNIPAQUE IOHEXOL 300 MG/ML  SOLN COMPARISON:  CT abdomen/pelvis dated 04/26/2021 FINDINGS: Lower chest: Lung bases are essentially clear. Hepatobiliary: Liver is within normal limits. Status post cholecystectomy. No intrahepatic or extrahepatic duct dilatation. Pancreas: Within normal limits. Spleen: 16 mm central hypoenhancing lesion in the upper spleen (series 6/image 18), incompletely characterized and possibly new from the prior. However, in the absence of known malignancy, splenic lesions are typically benign. Adrenals/Urinary Tract: Mild thickening of the bilateral adrenal glands without discrete nodule/mass. 9 mm left upper pole renal cyst (series 11/image 13), benign (Bosniak I). 13 mm hyperdense lesion in the anteromedial right lower kidney (series 2/image 42), measuring greater than 70 HUs, compatible with a benign hemorrhagic cyst (Bosniak II). No follow-up is recommended. No renal calculi or hydronephrosis. Stomach/Bowel: Stomach is within normal limits. Visualized bowel is grossly unremarkable. Vascular/Lymphatic: No evidence of abdominal aortic aneurysm. Atherosclerotic calcifications of the abdominal aorta and branch vessels. IVC filter, incompletely visualized. No suspicious abdominal lymphadenopathy. Other: No abdominal ascites. Musculoskeletal: L4-5 lumbar spine fixation hardware, incompletely visualized. Degenerative changes of the visualized thoracolumbar spine. IMPRESSION: Bilateral renal cysts, as described above, benign (Bosniak I-II). No follow-up is recommended. 16 mm hypoenhancing splenic lesion, possibly new, but likely  benign in the absence of known malignancy. Given the patient's weight loss, consider follow-up CT or MR in 6 months to confirm stability. Additional ancillary findings as above. Electronically Signed   By: Charline Bills M.D.   On: 02/21/2023 19:49    Microbiology: Results for orders placed or performed in visit on 02/10/23  Microscopic Examination     Status: Abnormal   Collection Time: 02/10/23 11:16 AM   Urine  Result Value Ref Range Status   WBC, UA 0-5 0 - 5 /hpf Final   RBC, Urine None seen 0 - 2 /hpf Final   Epithelial Cells (non renal) 0-10 0 - 10 /hpf Final   Renal Epithel, UA Present (A) None seen /hpf Final  Casts Present (A) None seen /lpf Final   Cast Type Cellular casts (A) N/A Final    Comment: Hyaline casts Granular casts    Crystals None seen N/A Final   Crystal Type None seen N/A Final   Mucus, UA Present (A) Not Estab. Final   Bacteria, UA Many (A) None seen/Few Final   Yeast, UA None seen None seen Final   Trichomonas, UA None seen None seen Final  Urine culture     Status: None   Collection Time: 02/10/23  1:23 PM   Specimen: Urine   BL  Result Value Ref Range Status   Urine Culture, Routine Final report  Final   Organism ID, Bacteria No growth  Final   *Note: Due to a large number of results and/or encounters for the requested time period, some results have not been displayed. A complete set of results can be found in Results Review.    Labs: CBC: Recent Labs  Lab 02/27/23 1745 02/28/23 0357  WBC 3.2* 4.5  NEUTROABS 1.7  --   HGB 12.9* 12.8*  HCT 40.5 39.5  MCV 72.5* 71.9*  PLT 240 249   Basic Metabolic Panel: Recent Labs  Lab 02/27/23 1745 02/28/23 0357  NA 137 136  K 3.7 3.9  CL 100 101  CO2 29 28  GLUCOSE 92 178*  BUN 10 12  CREATININE 0.95 0.84  CALCIUM 9.3 9.0   Liver Function Tests: No results for input(s): "AST", "ALT", "ALKPHOS", "BILITOT", "PROT", "ALBUMIN" in the last 168 hours. CBG: No results for input(s): "GLUCAP"  in the last 168 hours.  Discharge time spent: less than 30 minutes.  Signed: Brendia Sacks, MD [facility] 03/01/2023

## 2023-03-01 NOTE — TOC Transition Note (Signed)
Transition of Care [facility]) - CM/SW Discharge Note   Patient Details  Name: [name] [name] MRN: [id number] Date of Birth: 1951-03-07  Transition of Care [facility]) CM/SW Contact:  Amada Jupiter, LCSW Phone Number: 03/01/2023, 11:19 AM   Clinical Narrative:     Met with pt who is aware and agreeable with PT recommendations for rolling walker and HHPT - no agency preferences.  Order for RW placed with [employer] [facility] for delivery to room today.  HHPT set up with [facility] and contact info placed on AVS.  No further TOC needs.   Final next level of care: Home w Home Health Services Barriers to Discharge: Barriers Resolved   Patient Goals and CMS Choice      Discharge Placement                         Discharge Plan and Services Additional resources added to the After Visit Summary for   In-house Referral: Clinical Social Work              DME Arranged: Dan Humphreys rolling DME Agency: [facility] Date DME Agency Contacted: 03/01/23 Time DME Agency Contacted: 1000 Representative spoke with at DME Agency: Leavy Cella HH Arranged: PT HH Agency: [facility] Date [facility] Agency Contacted: 03/01/23   Representative spoke with at [facility] Agency: Elnita Maxwell  Social Determinants of Health (SDOH) Interventions SDOH Screenings   Food Insecurity: Food Insecurity Present (02/28/2023)  Housing: Medium Risk (02/28/2023)  Transportation Needs: No Transportation Needs (02/28/2023)  Utilities: Not At Risk (02/28/2023)  Alcohol Screen: Low Risk  (02/26/2023)  Depression (PHQ2-9): High Risk (01/02/2023)  Financial Resource Strain: Low Risk  (02/26/2023)  Social Connections: Moderately Isolated (02/26/2023)  Tobacco Use: High Risk (02/27/2023)     Readmission Risk Interventions     No data to display

## 2023-03-01 NOTE — Plan of Care (Signed)

## 2023-03-03 ENCOUNTER — Telehealth: Payer: Self-pay

## 2023-03-03 NOTE — Transitions of Care (Post Inpatient/ED Visit) (Signed)
03/03/2023  Name: [name] [name] MRN: [id number] DOB: August 21, 1951  Today's TOC FU Call Status: Today's TOC FU Call Status:: Successful TOC FU Call Competed TOC FU Call Complete Date: 03/03/23  Transition Care Management Follow-up Telephone Call Date of Discharge: 03/01/23 Discharge Facility: [name]) Type of Discharge: Inpatient Admission Primary Inpatient Discharge Diagnosis:: Spinal stenosis of lumbar region How have you been since you were released from the hospital?: Better Any questions or concerns?: No  Items Reviewed: Did you receive and understand the discharge instructions provided?: Yes Medications obtained,verified, and reconciled?: Yes (Medications Reviewed) Any new allergies since your discharge?: No Dietary orders reviewed?: Yes Do you have support at home?: Yes  Medications Reviewed Today: Medications Reviewed Today     Reviewed by Merleen Nicely, LPN (Licensed Practical Nurse) on 03/03/23 at 1038  Med List Status: <None>   Medication Order Taking? Sig Documenting Provider Last Dose Status Informant  albuterol (PROVENTIL HFA;VENTOLIN HFA) 108 (90 Base) MCG/ACT inhaler [id number] Yes Inhale 2 puffs into the lungs every 6 (six) hours as needed for wheezing or shortness of breath. Derwood Kaplan, MD Taking Active Self  albuterol (PROVENTIL) (2.5 MG/3ML) 0.083% nebulizer solution [id number] Yes Take 2.5 mg by nebulization every 6 (six) hours as needed for wheezing or shortness of breath. [provider] Taking Active Self  DULoxetine (CYMBALTA) 20 MG capsule [id number] Yes Take 1 capsule (20 mg total) by mouth daily. [name], MD Taking Active Self           Med Note Cyndie Chime, [facility] I   Fri Feb 28, 2023  2:12 AM) Take along with 60 mg   DULoxetine (CYMBALTA) 60 MG capsule [id number] Yes Take 60 mg by mouth daily. [provider] Taking Active Self           Med Note Cyndie Chime, [facility] I   Fri Feb 28, 2023  2:13 AM) Take along  with 20 mg  methylPREDNISolone (MEDROL DOSEPAK) 4 MG TBPK tablet [id number] Yes Day 1: 8mg  before breakfast, 4 mg after lunch, 4 mg after supper, and 8 mg at bedtime Day 2: 4 mg before breakfast, 4 mg after lunch, 4 mg  after supper, and 8 mg  at bedtime Day 3:  4 mg  before breakfast, 4 mg  after lunch, 4 mg after supper, and 4 mg  at bedtime Day 4: 4 mg  before breakfast, 4 mg  after lunch, and 4 mg at bedtime Day 5: 4 mg  before breakfast and 4 mg at bedtime Day 6: 4 mg  before breakfast Standley Brooking, MD Taking Active   metoprolol succinate (TOPROL-XL) 25 MG 24 hr tablet [id number] Yes Take 25 mg by mouth daily. [provider] Taking Active Self  oxyCODONE-acetaminophen (PERCOCET) 10-325 MG tablet [id number] Yes Take 1 tablet by mouth every 6 (six) hours as needed for pain. [provider] Taking Active Self  rivaroxaban (XARELTO) 20 MG TABS tablet [id number] Yes Take 1 tablet (20 mg total) by mouth daily with supper. Please follow up with Primary Care MD for future refills Johney Maine, MD Taking Active Self  tamsulosin [facility]) 0.4 MG CAPS capsule [id number] Yes Take 1 capsule (0.4 mg total) by mouth daily. Stoneking, Danford Bad., MD Taking Active Self            Home Care and Equipment/Supplies: Were Home Health Services Ordered?: No Any new equipment or medical supplies ordered?: Yes Name of Medical supply agency?: hospital - [facility] Were you able  to get the equipment/medical supplies?: Yes Do you have any questions related to the use of the equipment/supplies?: No  Functional Questionnaire: Do you need assistance with bathing/showering or dressing?: Yes (family assists) Do you need assistance with meal preparation?: Yes (family assists) Do you need assistance with eating?: No Do you have difficulty maintaining continence: No Do you need assistance with getting out of bed/getting out of a chair/moving?: Yes (walker) Do you have difficulty managing or  taking your medications?: Yes  Follow up appointments reviewed: PCP Follow-up appointment confirmed?: No MD Provider Line Number:[phone] Given: Yes Specialist Hospital Follow-up appointment confirmed?: Yes Date of Specialist follow-up appointment?: 03/03/23 Follow-Up Specialty Provider:: Dr Noel Gerold Do you need transportation to your follow-up appointment?: No Do you understand care options if your condition(s) worsen?: Yes-patient verbalized understanding    SIGNATURE  Woodfin Ganja LPN [facility] Nurse Health Advisor Direct Dial [phone]

## 2023-03-11 ENCOUNTER — Encounter: Payer: Self-pay | Admitting: Family Medicine

## 2023-03-11 ENCOUNTER — Ambulatory Visit (INDEPENDENT_AMBULATORY_CARE_PROVIDER_SITE_OTHER): Payer: 59 | Admitting: Family Medicine

## 2023-03-11 VITALS — BP 170/80 | HR 88 | Temp 99.0°F | Ht 76.0 in

## 2023-03-11 DIAGNOSIS — F331 Major depressive disorder, recurrent, moderate: Secondary | ICD-10-CM | POA: Diagnosis not present

## 2023-03-11 DIAGNOSIS — M5416 Radiculopathy, lumbar region: Secondary | ICD-10-CM | POA: Diagnosis not present

## 2023-03-11 DIAGNOSIS — Z7901 Long term (current) use of anticoagulants: Secondary | ICD-10-CM

## 2023-03-11 DIAGNOSIS — R3589 Other polyuria: Secondary | ICD-10-CM

## 2023-03-11 LAB — POCT GLYCOSYLATED HEMOGLOBIN (HGB A1C): Hemoglobin A1C: 5.5 % (ref 4.0–5.6)

## 2023-03-11 MED ORDER — CYCLOBENZAPRINE HCL 10 MG PO TABS
10.0000 mg | ORAL_TABLET | Freq: Three times a day (TID) | ORAL | 0 refills | Status: DC | PRN
Start: 2023-03-11 — End: 2023-10-16

## 2023-03-11 MED ORDER — ARIPIPRAZOLE 10 MG PO TABS
10.0000 mg | ORAL_TABLET | Freq: Every day | ORAL | 1 refills | Status: DC
Start: 2023-03-11 — End: 2023-08-25

## 2023-03-11 NOTE — Assessment & Plan Note (Addendum)
Severe spinal canal stenosis on MRI, he is going for surgery on July 9th. I filled out his paperwork for him. According to ACC/AHA guidelines the patient is considered low risk for surgery, he will get his EKG at preadmission testing. He is a smoker which can delay healing after surgery so I recommended that he use nicotine patches for a few weeks after surgery. I will refill his cyclobenzaprine 10 mg TID PRN to help with the pain at home until his surgery.

## 2023-03-11 NOTE — Assessment & Plan Note (Signed)
Pt reports significant improvement on the abilify, states he would like to increase the dose, will increase to 10 mg daily, RTC in 8 weeks after his surgery

## 2023-03-11 NOTE — Assessment & Plan Note (Signed)
Pt instructed to hold his xarelto for 72 hours prior to his surgery, may resume the medication immediately after.

## 2023-03-11 NOTE — Progress Notes (Signed)
Established Patient Office Visit  Subjective   Patient ID: [name] [name], male    DOB: 12-Oct-1950  Age: 72 y.o. MRN: [id number]  Chief Complaint  Patient presents with   Follow-up    Patient presents for ER follow up   Pre-op Exam    Also requests a surgical clearance for upcoming back surgery    Patinet is here for follow up today, he was seen twice in the urgent care for his acute back pain, on 6/20 and 6/25, was given steroids for the pain hoewver this was not effective. Is seeing Dr. Noel Gerold for this problem and will be getting surgery on his back -- I reviewed the notes and his MRI findings, has severe spinal stenosis that is worsening. Needs clearance for the surgery. He continues to smoke about 1/2 ppd, we discussed that smoking could delay his healing after surgery.   Depression-- pt reports the abilify worked very well for him, states that he felt really good and was much more pleasant to be around according to his wife. He denies any side effects to the medication at this time. He would like to increase the dose to 10 mg daily.     Current Outpatient Medications  Medication Instructions   albuterol (PROVENTIL HFA;VENTOLIN HFA) 108 (90 Base) MCG/ACT inhaler 2 puffs, Inhalation, Every 6 hours PRN   albuterol (PROVENTIL) 2.5 mg, Nebulization, Every 6 hours PRN   ARIPiprazole (ABILIFY) 10 mg, Oral, Daily   cyclobenzaprine (FLEXERIL) 10 mg, Oral, 3 times daily PRN   DULoxetine (CYMBALTA) 60 mg, Oral, Daily   DULoxetine (CYMBALTA) 20 mg, Oral, Daily   metoprolol succinate (TOPROL-XL) 25 mg, Oral, Daily   oxyCODONE-acetaminophen (PERCOCET) 10-325 MG tablet 1 tablet, Oral, Every 6 hours PRN   rivaroxaban (XARELTO) 20 mg, Oral, Daily with supper, Please follow up with Primary Care MD for future refills   tamsulosin (FLOMAX) 0.4 mg, Oral, Daily    Patient Active Problem List   Diagnosis Date Noted   Lumbar radiculopathy 03/11/2023   Spinal stenosis of lumbar region 02/28/2023    Essential hypertension 02/28/2023   History of DVT (deep vein thrombosis) 02/28/2023   Hyperlipidemia 02/28/2023   Depression with anxiety 02/28/2023   Microcytic anemia 02/28/2023   Renal mass; right 01/08/2023   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   BPH with obstruction/lower urinary tract symptoms 01/08/2023   Chronic prostatitis 01/08/2023   Lesion of right native kidney 01/03/2023   Tick bite of right thigh 01/03/2023   Moderate episode of recurrent major depressive disorder (HCC) 01/03/2023   Right flank pain 05/01/2021   Chronic right-sided back pain 05/01/2021   Chronic diarrhea 05/01/2021   Primary osteoarthritis of left knee 03/29/2020   Primary osteoarthritis of right knee 07/08/2017   Complete rotator cuff tear of left shoulder 10/21/2016   Gout attack 04/02/2016   Angina pectoris (HCC) 08/01/2014   Hemorrhage of rectum and anus 03/28/2014   Neuropathy, peripheral 05/15/2012   Pulmonary embolism (HCC) 07/11/2011   DVT (deep venous thrombosis) (HCC) 07/11/2011   Abdominal pain, left lower quadrant 01/29/2011   Long term current use of anticoagulant therapy 01/29/2011   Diarrhea 01/29/2011      Review of Systems  Constitutional:  Negative for chills, fever and weight loss.  Respiratory:  Negative for shortness of breath.   Cardiovascular:  Negative for chest pain and leg swelling.  All other systems reviewed and are negative.     Objective:     BP (!) 170/80 Comment: repeated by  Mykal--jaf  Pulse 88   Temp 99 F (37.2 C) (Oral)   Ht 6\' 4"  (1.93 m)   SpO2 98%   BMI 23.59 kg/m     Physical Exam Vitals reviewed.  Constitutional:      Appearance: Normal appearance. He is well-groomed and normal weight.  Eyes:     Extraocular Movements: Extraocular movements intact.     Conjunctiva/sclera: Conjunctivae normal.  Neck:     Thyroid: No thyromegaly.  Cardiovascular:     Rate and Rhythm: Normal rate and regular rhythm.     Heart sounds: S1 normal and S2 normal.  No murmur heard. Pulmonary:     Effort: Pulmonary effort is normal.     Breath sounds: Normal breath sounds and air entry. No rales.  Abdominal:     General: Abdomen is flat. Bowel sounds are normal.  Musculoskeletal:     Right lower leg: No edema.     Left lower leg: No edema.  Neurological:     General: No focal deficit present.     Mental Status: He is alert and oriented to person, place, and time.     Gait: Gait is intact.  Psychiatric:        Mood and Affect: Mood and affect normal.      Results for orders placed or performed in visit on 03/11/23  POC HgB A1c  Result Value Ref Range   Hemoglobin A1C 5.5 4.0 - 5.6 %   HbA1c POC (<> result, manual entry)     HbA1c, POC (prediabetic range)     HbA1c, POC (controlled diabetic range)         The ASCVD Risk score (Arnett DK, et al., 2019) failed to calculate for the following reasons:   Cannot find a previous HDL lab   Cannot find a previous total cholesterol lab    Assessment & Plan:  Lumbar radiculopathy Assessment & Plan: Severe spinal canal stenosis on MRI, he is going for surgery on July 9th. I filled out his paperwork for him. According to ACC/AHA guidelines the patient is considered low risk for surgery, he will get his EKG at preadmission testing. He is a smoker which can delay healing after surgery so I recommended that he use nicotine patches for a few weeks after surgery. I will refill his cyclobenzaprine 10 mg TID PRN to help with the pain at home until his surgery.   Orders: -     Cyclobenzaprine HCl; Take 1 tablet (10 mg total) by mouth 3 (three) times daily as needed for muscle spasms.  Dispense: 60 tablet; Refill: 0  Polyuria -     POCT glycosylated hemoglobin (Hb A1C)  Moderate episode of recurrent major depressive disorder (HCC) Assessment & Plan: Pt reports significant improvement on the abilify, states he would like to increase the dose, will increase to 10 mg daily, RTC in 8 weeks after his  surgery  Orders: -     ARIPiprazole; Take 1 tablet (10 mg total) by mouth daily.  Dispense: 90 tablet; Refill: 1  Long term current use of anticoagulant therapy Assessment & Plan: Pt instructed to hold his xarelto for 72 hours prior to his surgery, may resume the medication immediately after.      Return in about 2 months (around 05/12/2023).    [name], MD

## 2023-03-14 ENCOUNTER — Telehealth: Payer: Self-pay | Admitting: Family Medicine

## 2023-03-14 NOTE — Telephone Encounter (Signed)
Pt wife is calling checking on the form for surgical clearance . Pt has surgery sch for 03-18-2023

## 2023-03-14 NOTE — Telephone Encounter (Signed)
Patient brought in surgical clearance form from [facility] and they are checking on progress. When completed pls fax to [phone]

## 2023-03-17 NOTE — Telephone Encounter (Signed)
Pt wife checking to see if paperwork has been sent.

## 2023-03-17 NOTE — Telephone Encounter (Signed)
Spoke with the patient's wife and informed her PCP completed the form and this was faxed to [facility] at [phone], along with office notes from 7/2.  Form was sent to be scanned.

## 2023-03-20 ENCOUNTER — Telehealth: Payer: Self-pay | Admitting: Family Medicine

## 2023-03-20 NOTE — Telephone Encounter (Signed)
Ok to send verbal order

## 2023-03-20 NOTE — Telephone Encounter (Signed)
[name] [name] with [facility]  is calling pt was d/c from [facility]  yesterday and nina would like to know if md will continue to authorization PT,OT

## 2023-03-20 NOTE — Telephone Encounter (Signed)
Charisse from [facility] call and stated she need a verbal order for PT for gait training ,transferring and bed mobility 1 wk-1, 3 wk's -1 and 2 wk's -1.Elzie Rings 's # is [phone].

## 2023-03-20 NOTE — Telephone Encounter (Signed)
Left a detailed message with the approval for orders as below on Nina's voicemail.

## 2023-03-20 NOTE — Telephone Encounter (Signed)
Yes ok to send verbal order

## 2023-03-20 NOTE — Telephone Encounter (Signed)
Left a detailed message on Charisse's voicemail with approval for orders as below.

## 2023-03-21 ENCOUNTER — Telehealth: Payer: Self-pay | Admitting: Family Medicine

## 2023-03-21 NOTE — Telephone Encounter (Signed)
Will, OT with [facility] [phone] *Ok to leave a detailed message on this line  Verbal Orders - OT 2 x a wk for 2 wks

## 2023-03-24 NOTE — Telephone Encounter (Signed)
 Ok to send verbal orders

## 2023-03-24 NOTE — Telephone Encounter (Signed)
Spoke with Will and informed him of the approval for orders as below.

## 2023-04-02 ENCOUNTER — Telehealth: Payer: Self-pay | Admitting: Family Medicine

## 2023-04-02 NOTE — Telephone Encounter (Signed)
[name] [name] from [facility] call and stated she want dr.Michael to know pt blood pressure is 188/100 he has already taken his blood pressure medication this morning and his bp is still running high also stated his pain level is a 9 out of 10 today in his lower back.[name]'s # is [phone].

## 2023-04-02 NOTE — Telephone Encounter (Signed)
Spoke with Morrie Sheldon and informed her the patient should be seen in the ER due to the symptoms below.  Morrie Sheldon stated she will inform the patient of this advice.

## 2023-04-05 ENCOUNTER — Emergency Department (HOSPITAL_COMMUNITY)
Admission: EM | Admit: 2023-04-05 | Discharge: 2023-04-05 | Disposition: A | Discharge status: Home / Self Care | Payer: 59 | Attending: Emergency Medicine | Admitting: Emergency Medicine

## 2023-04-05 ENCOUNTER — Emergency Department (HOSPITAL_COMMUNITY): Payer: 59

## 2023-04-05 ENCOUNTER — Encounter (HOSPITAL_COMMUNITY): Payer: Self-pay | Admitting: Emergency Medicine

## 2023-04-05 ENCOUNTER — Other Ambulatory Visit: Payer: Self-pay

## 2023-04-05 DIAGNOSIS — J441 Chronic obstructive pulmonary disease with (acute) exacerbation: Secondary | ICD-10-CM | POA: Diagnosis not present

## 2023-04-05 DIAGNOSIS — D509 Iron deficiency anemia, unspecified: Secondary | ICD-10-CM | POA: Diagnosis not present

## 2023-04-05 DIAGNOSIS — I10 Essential (primary) hypertension: Secondary | ICD-10-CM | POA: Insufficient documentation

## 2023-04-05 DIAGNOSIS — Z79899 Other long term (current) drug therapy: Secondary | ICD-10-CM | POA: Insufficient documentation

## 2023-04-05 DIAGNOSIS — Z7951 Long term (current) use of inhaled steroids: Secondary | ICD-10-CM | POA: Diagnosis not present

## 2023-04-05 DIAGNOSIS — Z7901 Long term (current) use of anticoagulants: Secondary | ICD-10-CM | POA: Insufficient documentation

## 2023-04-05 DIAGNOSIS — R7309 Other abnormal glucose: Secondary | ICD-10-CM | POA: Insufficient documentation

## 2023-04-05 DIAGNOSIS — R0602 Shortness of breath: Secondary | ICD-10-CM | POA: Diagnosis present

## 2023-04-05 LAB — CBC WITH DIFFERENTIAL/PLATELET
Abs Immature Granulocytes: 0.03 10*3/uL (ref 0.00–0.07)
Basophils Absolute: 0 10*3/uL (ref 0.0–0.1)
Basophils Relative: 1 %
Eosinophils Absolute: 0.1 10*3/uL (ref 0.0–0.5)
Eosinophils Relative: 1 %
HCT: 31.9 % — ABNORMAL LOW (ref 39.0–52.0)
Hemoglobin: 9.7 g/dL — ABNORMAL LOW (ref 13.0–17.0)
Immature Granulocytes: 1 %
Lymphocytes Relative: 14 %
Lymphs Abs: 0.9 10*3/uL (ref 0.7–4.0)
MCH: 22.9 pg — ABNORMAL LOW (ref 26.0–34.0)
MCHC: 30.4 g/dL (ref 30.0–36.0)
MCV: 75.2 fL — ABNORMAL LOW (ref 80.0–100.0)
Monocytes Absolute: 0.3 10*3/uL (ref 0.1–1.0)
Monocytes Relative: 4 %
Neutro Abs: 5.3 10*3/uL (ref 1.7–7.7)
Neutrophils Relative %: 79 %
Platelets: 289 10*3/uL (ref 150–400)
RBC: 4.24 MIL/uL (ref 4.22–5.81)
RDW: 16.7 % — ABNORMAL HIGH (ref 11.5–15.5)
WBC: 6.6 10*3/uL (ref 4.0–10.5)
nRBC: 0 % (ref 0.0–0.2)

## 2023-04-05 LAB — BASIC METABOLIC PANEL
Anion gap: 10 (ref 5–15)
BUN: 9 mg/dL (ref 8–23)
CO2: 25 mmol/L (ref 22–32)
Calcium: 8.7 mg/dL — ABNORMAL LOW (ref 8.9–10.3)
Chloride: 108 mmol/L (ref 98–111)
Creatinine, Ser: 0.77 mg/dL (ref 0.61–1.24)
GFR, Estimated: >60 mL/min (ref >60)
Glucose, Bld: 129 mg/dL — ABNORMAL HIGH (ref 70–99)
Potassium: 3.5 mmol/L (ref 3.5–5.1)
Sodium: 143 mmol/L (ref 135–145)

## 2023-04-05 MED ORDER — PREDNISONE 50 MG PO TABS: 50.0000 mg | ORAL_TABLET | Freq: Every day | ORAL | 0 refills | Status: DC

## 2023-04-05 MED ORDER — IPRATROPIUM-ALBUTEROL 0.5-2.5 (3) MG/3ML IN SOLN
3.0000 mL | Freq: Once | RESPIRATORY_TRACT | Status: AC
  Administered 2023-04-05: 3 mL via RESPIRATORY_TRACT
  Filled 2023-04-05: qty 3

## 2023-04-05 NOTE — Discharge Instructions (Signed)
Continue using your inhaler-2 puffs at a time, every 4 hours as needed.  Return if breathing is not being adequately controlled with medications at home.

## 2023-04-05 NOTE — ED Triage Notes (Signed)
Patient BIB EMS from home c/o SOB. Per report pt woke up from his sleep unable to breath. Per report 88 % on RA by EMS. 15mg  albuterol, 1 mg atrovent, 125 mg Solumedrol IV, 2G mag by EMS. Pt denies N/V. PT a/ox4. Hx of COPD  BP 180/90 HR 98 RR 22 O2sat 100% on 8L

## 2023-04-05 NOTE — ED Notes (Signed)
Pt's fiance Terri Piedra contacted and advised someone would be there in 30 minutes to pick pt up.

## 2023-04-05 NOTE — ED Provider Notes (Signed)
[place] EMERGENCY DEPARTMENT AT [facility] Provider Note   CSN: [id number] Arrival date & time: 04/05/23  0522     History  Chief Complaint  Patient presents with   Shortness of Breath    [name] [name] is a 72 y.o. male.  The history is provided by the patient.  Shortness of Breath He has history of hypertension, COPD, DVT anticoagulated on rivaroxaban and was brought in by ambulance because of shortness of breath.  He woke up feeling short of breath.  He denies chest pain, heaviness, tightness, pressure.  He has had a cough productive of some clear to brownish sputum.  He denies fever or chills.  He has been having some intermittent night sweats.  EMS noted initial oxygen saturation 88% on room air.  He was given albuterol 15 mg and ipratropium 1 mg via nebulizer, methylprednisolone intravenously, magnesium intravenously.  He does note some improvement with this.   Home Medications Prior to Admission medications   Medication Sig Start Date End Date Taking? Authorizing Provider  albuterol (PROVENTIL HFA;VENTOLIN HFA) 108 (90 Base) MCG/ACT inhaler Inhale 2 puffs into the lungs every 6 (six) hours as needed for wheezing or shortness of breath. 01/27/17   Derwood Kaplan, MD  albuterol (PROVENTIL) (2.5 MG/3ML) 0.083% nebulizer solution Take 2.5 mg by nebulization every 6 (six) hours as needed for wheezing or shortness of breath. 02/04/23   [provider]  ARIPiprazole (ABILIFY) 10 MG tablet Take 1 tablet (10 mg total) by mouth daily. 03/11/23   [name], MD  cyclobenzaprine (FLEXERIL) 10 MG tablet Take 1 tablet (10 mg total) by mouth 3 (three) times daily as needed for muscle spasms. 03/11/23   [name], MD  DULoxetine (CYMBALTA) 20 MG capsule Take 1 capsule (20 mg total) by mouth daily. 01/02/23   [name], MD  DULoxetine (CYMBALTA) 60 MG capsule Take 60 mg by mouth daily.    [provider]  metoprolol succinate (TOPROL-XL) 25 MG  24 hr tablet Take 25 mg by mouth daily.    [provider]  oxyCODONE-acetaminophen (PERCOCET) 10-325 MG tablet Take 1 tablet by mouth every 6 (six) hours as needed for pain.    [provider]  rivaroxaban (XARELTO) 20 MG TABS tablet Take 1 tablet (20 mg total) by mouth daily with supper. Please follow up with Primary Care MD for future refills 10/19/19   Johney Maine, MD  tamsulosin (FLOMAX) 0.4 MG CAPS capsule Take 1 capsule (0.4 mg total) by mouth daily. 01/08/23   Stoneking, Danford Bad., MD      Allergies    Aspirin, Celecoxib, Ibuprofen, Lyrica [pregabalin], Nsaids, and Vicodin [hydrocodone-acetaminophen]    Review of Systems   Review of Systems  Respiratory:  Positive for shortness of breath.   All other systems reviewed and are negative.   Physical Exam Updated Vital Signs BP (!) 170/88 (BP Location: Right Arm)   Pulse 98   Temp 97.8 F (36.6 C)   Resp (!) 25   Ht 6\' 4"  (1.93 m)   Wt 87.9 kg   SpO2 100%   BMI 23.59 kg/m  Physical Exam Vitals reviewed.   72 year old male, resting comfortably and in no acute distress. Vital signs are significant for elevated blood pressure and respiratory rate. Oxygen saturation is 100%, which is normal. Head is normocephalic and atraumatic. PERRLA, EOMI. Oropharynx is clear. Neck is nontender and supple without adenopathy or JVD. Back is nontender and there is  no CVA tenderness. Lungs have diminished airflow with faint expiratory wheezes diffusely.  There are no rales or rhonchi. Chest is nontender. Heart has regular rate and rhythm without murmur. Abdomen is soft, flat, nontender. Extremities have no cyanosis or edema, full range of motion is present. Skin is warm and dry without rash. Neurologic: Mental status is normal, cranial nerves are intact, moves all extremities equally.  ED Results / Procedures / Treatments   Labs (all labs ordered are listed, but only abnormal results are displayed) Labs Reviewed   BASIC METABOLIC PANEL - Abnormal; Notable for the following components:      Result Value   Glucose, Bld 129 (*)    Calcium 8.7 (*)    All other components within normal limits  CBC WITH DIFFERENTIAL/PLATELET - Abnormal; Notable for the following components:   Hemoglobin 9.7 (*)    HCT 31.9 (*)    MCV 75.2 (*)    MCH 22.9 (*)    RDW 16.7 (*)    All other components within normal limits   Radiology DG Chest Port 1 View  Result Date: 04/05/2023 CLINICAL DATA:  Shortness of breath and hypoxemia. EXAM: PORTABLE CHEST 1 VIEW COMPARISON:  PA Lat 11/12/2022 FINDINGS: The heart is slightly enlarged but unchanged. No vascular congestion is seen. The mediastinum is stable with mild aortic tortuosity and calcific plaques. There is no substantial pleural effusion. The lungs are mildly emphysematous but clear. Advanced thoracic spondylosis. IMPRESSION: No evidence of acute chest disease. Emphysema. Aortic atherosclerosis. Stable COPD chest. Electronically Signed   By: Almira Bar M.D.   On: 04/05/2023 06:45    Procedures Procedures  Cardiac monitor shows normal sinus rhythm, per my interpretation.  Medications Ordered in ED Medications - No data to display  ED Course/ Medical Decision Making/ A&P                             Medical Decision Making  As COPD exacerbation.  I have ordered additional albuterol with ipratropium via nebulizer and I have ordered chest x-ray and screening labs of CBC and basic metabolic panel.    I have reviewed and interpreted his laboratory tests, my interpretation is elevated random glucose level, microcytic anemia.  Chest x-ray shows COPD but no acute changes.  I have independently viewed the image, and agree with radiologist interpretation.  Patient had good relief of symptoms with above-noted treatment.  On reexam, lungs are completely clear.  I am discharging him with a prescription for prednisone and recommended follow-up with PCP in 5 days.  Final  Clinical Impression(s) / ED Diagnoses Final diagnoses:  COPD exacerbation (HCC)  Elevated random blood glucose level  Microcytic anemia    Rx / DC Orders ED Discharge Orders          Ordered    predniSONE (DELTASONE) 50 MG tablet  Daily        04/05/23 0653              Dione Booze, MD 04/05/23 [phone]

## 2023-04-07 ENCOUNTER — Ambulatory Visit: Payer: 59 | Admitting: Urology

## 2023-04-07 NOTE — Progress Notes (Deleted)
Assessment: 1. Bilateral renal cysts; Bosniak 2   2. BPH with obstruction/lower urinary tract symptoms   3. Organic impotence     Plan: Continue tamsulosin Will call with results and to arrange next visit  Chief Complaint:  No chief complaint on file.   History of Present Illness:  [name] [name] is a 72 y.o. male who is seen for further evaluation of right renal mass, flank pain, and dysuria.  He has a history of right-sided flank pain for approximately 1 year.  He underwent back surgery in April 2023.  He continued to have right-sided flank pain. MRI of the lumbar spine from 9/23 showed 2 indeterminate right renal lesions possibly reflecting hemorrhagic cysts.  No additional imaging has been performed since that time.  Prior CT study from 8/22 did not show any obvious renal lesions.  He was seen by Dr. Sabino Gasser at [facility] in February 2024 for evaluation of prostatitis, BPH with lower urinary tract symptoms, and erectile dysfunction.  He has a history of prostatitis requiring 1 hospitalization in the past.  In February 2024, he was having symptoms of dysuria, frequency, urgency, nocturia, and incomplete emptying.  He also had some right flank pain.  He was diagnosed with prostatitis and treated with Cipro x 10 days. He also has a history of erectile dysfunction.  Sildenafil has been ineffective for him.  He was given a prescription for tadalafil 20 mg daily. PSA 2/24:  0.34  He continued to have lower urinary tract symptoms including urgency, nocturia x 4, and dysuria.  He had pain during and after urination.  The burning lasts for approximately 20 minutes and resolves.  No gross hematuria.  No recent UTIs.  He did not see any improvement in his symptoms with the Cipro. IPSS = 21. He reports a 50 lb weight loss in the past 6 months.  He was diagnosed with prostatitis and treated with Bactrim DS x 21 days.  He was also started on tamsulosin 0.4 mg daily.  He had not had  his MRI due to concerns about his ability to tolerate study given his prior experience. He completed the antibiotics and he continued on tamsulosin.  He continued to have urgency, nocturia 5-6 times, and dysuria.  He had not seen any significant improvement with current treatment.    CT abdomen with and without contrast from 02/15/2023 showed bilateral renal cyst, consistent with Bosniak 2 lesions, no solid masses or suspicious lesions.  He was also noted to have a hypoenhancing splenic lesion.  Portions of the above documentation were copied from a prior visit for review purposes only.  Past Medical History:  Past Medical History:  Diagnosis Date   Anxiety and depression    Back pain    Diverticulitis    Diverticulosis    DVT (deep venous thrombosis) (HCC)    Gout    Hernia    History of blood clots    Hypertension    Lesion of right native kidney 01/03/2023   Nausea and vomiting    Neuropathy, peripheral 05/15/2012   patient denies   Shingles     Past Surgical History:  Past Surgical History:  Procedure Laterality Date   APPENDECTOMY     BACK SURGERY     CHOLECYSTECTOMY  09/09/2006   HERNIA REPAIR     HIATAL HERNIA REPAIR     IR ANGIO INTRA EXTRACRAN SEL INTERNAL CAROTID BILAT MOD SED  01/18/2020   IR ANGIO VERTEBRAL SEL VERTEBRAL BILAT  MOD SED  01/18/2020   IR US GUIDE VASC ACCESS RIGHT  01/18/2020   LEFT HEART CATHETERIZATION WITH CORONARY ANGIOGRAM N/A 08/02/2014   Procedure: LEFT HEART CATHETERIZATION WITH CORONARY ANGIOGRAM;  Surgeon: Pamella Pert, MD;  Location: [facility] CATH LAB;  Service: Cardiovascular;  Laterality: N/A;   MULTIPLE EXTRACTIONS WITH ALVEOLOPLASTY N/A 10/19/2015   Procedure: Extraction of tooth #'s 2,12,13 with alveoloplasty;  Surgeon: Charlynne Pander, DDS;  Location: [facility] OR;  Service: Oral Surgery;  Laterality: N/A;   SACRAL NERVE STIMULATOR PLACEMENT  09/09/2009   [employer] Restore[employer]VANCED [phone] MRI  info-[email or web address]    Allergies:  Allergies  Allergen Reactions   Aspirin Other (See Comments)    ulcers   Celecoxib Other (See Comments)    Stomach irritation   Ibuprofen Other (See Comments)    ulcers   Lyrica [Pregabalin] Swelling   Nsaids    Vicodin [Hydrocodone-Acetaminophen]     Makes me mean     Family History:  Family History  Problem Relation Age of Onset   Prostate cancer Father    Heart disease Father    Diabetes Mother    Heart disease Mother    Colon cancer Maternal Uncle        dx in his 84's   Pancreatic cancer Paternal Uncle    Prostate cancer Paternal Uncle    Multiple sclerosis Daughter    Prostate cancer Paternal Uncle    Esophageal cancer Neg Hx    Rectal cancer Neg Hx    Stomach cancer Neg Hx     Social History:  Social History   Tobacco Use   Smoking status: Every Day    Current packs/day: 0.50    Average packs/day: 0.5 packs/day for 40.0 years (20.0 ttl pk-yrs)    Types: Cigarettes   Smokeless tobacco: Never  Vaping Use   Vaping status: Never Used  Substance Use Topics   Alcohol use: Yes    Alcohol/week: 0.0 standard drinks of alcohol    Comment: Occasional   Drug use: Yes    Types: Marijuana    Comment: for nausea    ROS: Constitutional:  Negative for fever, chills, weight loss CV: Negative for chest pain, previous MI, hypertension Respiratory:  Negative for shortness of breath, wheezing, sleep apnea, frequent cough GI:  Negative for nausea, vomiting, bloody stool, GERD  Physical exam: There were no vitals taken for this visit. GENERAL APPEARANCE:  Well appearing, well developed, well nourished, NAD HEENT:  Atraumatic, normocephalic, oropharynx clear NECK:  Supple without lymphadenopathy or thyromegaly ABDOMEN:  Soft, non-tender, no masses EXTREMITIES:  Moves all extremities well, without clubbing, cyanosis, or edema NEUROLOGIC:  Alert and oriented x 3, normal gait,  CN II-XII grossly intact MENTAL STATUS:  appropriate BACK:  Non-tender to palpation, No CVAT SKIN:  Warm, dry, and intact   Results: U/A:

## 2023-04-09 ENCOUNTER — Encounter: Payer: Self-pay | Admitting: Urology

## 2023-04-09 ENCOUNTER — Ambulatory Visit: Payer: 59 | Admitting: Urology

## 2023-04-09 VITALS — BP 173/92 | HR 87 | Ht 76.0 in | Wt 200.0 lb

## 2023-04-09 DIAGNOSIS — N401 Enlarged prostate with lower urinary tract symptoms: Secondary | ICD-10-CM

## 2023-04-09 DIAGNOSIS — N411 Chronic prostatitis: Secondary | ICD-10-CM

## 2023-04-09 DIAGNOSIS — N281 Cyst of kidney, acquired: Secondary | ICD-10-CM | POA: Diagnosis not present

## 2023-04-09 DIAGNOSIS — N529 Male erectile dysfunction, unspecified: Secondary | ICD-10-CM

## 2023-04-09 DIAGNOSIS — N138 Other obstructive and reflux uropathy: Secondary | ICD-10-CM

## 2023-04-09 LAB — URINALYSIS, ROUTINE W REFLEX MICROSCOPIC
Bilirubin, UA: NEGATIVE
Glucose, UA: NEGATIVE
Ketones, UA: NEGATIVE
Leukocytes,UA: NEGATIVE
Nitrite, UA: NEGATIVE
RBC, UA: NEGATIVE
Specific Gravity, UA: 1.02 (ref 1.005–1.030)
Urobilinogen, Ur: 0.2 mg/dL (ref 0.2–1.0)
pH, UA: 6 (ref 5.0–7.5)

## 2023-04-09 LAB — MICROSCOPIC EXAMINATION
Bacteria, UA: NONE SEEN
RBC, Urine: NONE SEEN /hpf (ref 0–2)

## 2023-04-09 MED ORDER — LEVOFLOXACIN 500 MG PO TABS
500.0000 mg | ORAL_TABLET | Freq: Every day | ORAL | 0 refills | Status: AC
Start: 2023-04-09 — End: 2023-04-23

## 2023-04-09 NOTE — Progress Notes (Signed)
Assessment: 1. 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 BPH with obstruction/lower urinary tract symptoms   3. Organic impotence   4. Chronic prostatitis     Plan: I personally reviewed the CT study from 02/15/2023 with results as noted below.  I discussed these results with the patient in detail today.  The bilateral renal cyst are benign in appearance and do not require further evaluation or follow-up. Continue tamsulosin Levaquin 500 mg daily x 2 weeks for prostatitis Return to office in 2 months   Chief Complaint:  Chief Complaint  Patient presents with   Renal cyst    History of Present Illness:  [name] [name] is a 72 y.o. male who is seen for further evaluation of right renal mass, flank pain, and dysuria.  He has a history of right-sided flank pain for approximately 1 year.  He underwent back surgery in April 2023.  He continued to have right-sided flank pain. MRI of the lumbar spine from 9/23 showed 2 indeterminate right renal lesions possibly reflecting hemorrhagic cysts.  No additional imaging has been performed since that time.  Prior CT study from 8/22 did not show any obvious renal lesions.  He was seen by Dr. Sabino Gasser at [facility] in February 2024 for evaluation of prostatitis, BPH with lower urinary tract symptoms, and erectile dysfunction.  He has a history of prostatitis requiring 1 hospitalization in the past.  In February 2024, he was having symptoms of dysuria, frequency, urgency, nocturia, and incomplete emptying.  He also had some right flank pain.  He was diagnosed with prostatitis and treated with Cipro x 10 days. He also has a history of erectile dysfunction.  Sildenafil has been ineffective for him.  He was given a prescription for tadalafil 20 mg prn. PSA 2/24:  0.34  He continued to have lower urinary tract symptoms including urgency, nocturia x 4, and dysuria.  He had pain during and after urination.  The burning lasts for approximately 20  minutes and resolves.  No gross hematuria.  No recent UTIs.  He did not see any improvement in his symptoms with the Cipro. IPSS = 21. He reports a 50 lb weight loss in the past 6 months.  He was diagnosed with prostatitis and treated with Bactrim DS x 21 days.  He was also started on tamsulosin 0.4 mg daily.  He had not had his MRI due to concerns about his ability to tolerate study given his prior experience. He completed the antibiotics and he continued on tamsulosin.  He continued to have urgency, nocturia 5-6 times, and dysuria.  He had not seen any significant improvement with current treatment.    CT abdomen with and without contrast from 02/15/2023 showed bilateral renal cyst, consistent with Bosniak 2 lesions, no solid masses or suspicious lesions.  He was also noted to have a hypoenhancing splenic lesion.  He returns today for follow-up.  He has had a return of the dysuria.  He reports that this is intermittent in nature.  He also has some urinary frequency and urgency.  He continues on tamsulosin.  No gross hematuria or flank pain. IPSS = 15 today.  Portions of the above documentation were copied from a prior visit for review purposes only.  Past Medical History:  Past Medical History:  Diagnosis Date   Anxiety and depression    Back pain    Diverticulitis    Diverticulosis    DVT (deep venous thrombosis) (HCC)    Gout  Hernia    History of blood clots    Hypertension    Lesion of right native kidney 01/03/2023   Nausea and vomiting    Neuropathy, peripheral 05/15/2012   patient denies   Shingles     Past Surgical History:  Past Surgical History:  Procedure Laterality Date   APPENDECTOMY     BACK SURGERY     CHOLECYSTECTOMY  09/09/2006   HERNIA REPAIR     HIATAL HERNIA REPAIR     IR ANGIO INTRA EXTRACRAN SEL INTERNAL CAROTID BILAT MOD SED  01/18/2020   IR ANGIO VERTEBRAL SEL VERTEBRAL BILAT MOD SED  01/18/2020   IR US GUIDE VASC ACCESS RIGHT  01/18/2020   LEFT  HEART CATHETERIZATION WITH CORONARY ANGIOGRAM N/A 08/02/2014   Procedure: LEFT HEART CATHETERIZATION WITH CORONARY ANGIOGRAM;  Surgeon: Pamella Pert, MD;  Location: [facility] CATH LAB;  Service: Cardiovascular;  Laterality: N/A;   MULTIPLE EXTRACTIONS WITH ALVEOLOPLASTY N/A 10/19/2015   Procedure: Extraction of tooth #'s 2,12,13 with alveoloplasty;  Surgeon: Charlynne Pander, DDS;  Location: [facility] OR;  Service: Oral Surgery;  Laterality: N/A;   SACRAL NERVE STIMULATOR PLACEMENT  09/09/2009   [employer] [phone] MRI info-[email or web address]    Allergies:  Allergies  Allergen Reactions   Nsaids     Other Reaction(s): GI Intolerance, Other (See Comments)   Aspirin Other (See Comments)    ulcers   Celecoxib Other (See Comments)    Stomach irritation   Ibuprofen Other (See Comments)    ulcers   Lyrica [Pregabalin] Swelling   Vicodin [Hydrocodone-Acetaminophen]     Makes me mean     Family History:  Family History  Problem Relation Age of Onset   Prostate cancer Father    Heart disease Father    Diabetes Mother    Heart disease Mother    Colon cancer Maternal Uncle        dx in his 39's   Pancreatic cancer Paternal Uncle    Prostate cancer Paternal Uncle    Multiple sclerosis Daughter    Prostate cancer Paternal Uncle    Esophageal cancer Neg Hx    Rectal cancer Neg Hx    Stomach cancer Neg Hx     Social History:  Social History   Tobacco Use   Smoking status: Every Day    Current packs/day: 0.50    Average packs/day: 0.5 packs/day for 40.0 years (20.0 ttl pk-yrs)    Types: Cigarettes   Smokeless tobacco: Never  Vaping Use   Vaping status: Never Used  Substance Use Topics   Alcohol use: Yes    Alcohol/week: 0.0 standard drinks of alcohol    Comment: Occasional   Drug use: Yes    Types: Marijuana    Comment: for nausea    ROS: Constitutional:  Negative for fever, chills, weight loss CV: Negative  for chest pain, previous MI, hypertension Respiratory:  Negative for shortness of breath, wheezing, sleep apnea, frequent cough GI:  Negative for nausea, vomiting, bloody stool, GERD  Physical exam: BP (!) 173/92   Pulse 87   Ht 6\' 4"  (1.93 m)   Wt 200 lb (90.7 kg)   BMI 24.34 kg/m  GENERAL APPEARANCE:  Well appearing, well developed, well nourished, NAD HEENT:  Atraumatic, normocephalic, oropharynx clear NECK:  Supple without lymphadenopathy or thyromegaly ABDOMEN:  Soft, non-tender, no masses EXTREMITIES:  Moves all extremities well, without clubbing, cyanosis, or edema NEUROLOGIC:  Alert and oriented x 3, normal gait, CN II-XII grossly intact MENTAL  STATUS:  appropriate BACK:  Non-tender to palpation, No CVAT SKIN:  Warm, dry, and intact   Results: U/A: 0-5 WBC, 0 RBC

## 2023-04-14 DIAGNOSIS — M48062 Spinal stenosis, lumbar region with neurogenic claudication: Secondary | ICD-10-CM | POA: Diagnosis not present

## 2023-04-22 ENCOUNTER — Other Ambulatory Visit: Payer: Self-pay | Admitting: Orthopaedic Surgery

## 2023-04-22 DIAGNOSIS — I82403 Acute embolism and thrombosis of unspecified deep veins of lower extremity, bilateral: Secondary | ICD-10-CM

## 2023-04-24 ENCOUNTER — Encounter (INDEPENDENT_AMBULATORY_CARE_PROVIDER_SITE_OTHER): Payer: Self-pay

## 2023-04-25 ENCOUNTER — Encounter: Payer: Self-pay | Admitting: Family Medicine

## 2023-04-25 DIAGNOSIS — M7989 Other specified soft tissue disorders: Secondary | ICD-10-CM

## 2023-04-25 MED ORDER — FUROSEMIDE 20 MG PO TABS
20.0000 mg | ORAL_TABLET | Freq: Every day | ORAL | 0 refills | Status: DC
Start: 2023-04-25 — End: 2023-05-13

## 2023-04-28 ENCOUNTER — Other Ambulatory Visit: Payer: 59

## 2023-04-30 ENCOUNTER — Ambulatory Visit
Admission: RE | Admit: 2023-04-30 | Discharge: 2023-04-30 | Disposition: A | Discharge status: Home / Self Care | Payer: 59 | Source: Ambulatory Visit | Attending: Orthopaedic Surgery | Admitting: Orthopaedic Surgery

## 2023-04-30 DIAGNOSIS — Z86718 Personal history of other venous thrombosis and embolism: Secondary | ICD-10-CM | POA: Diagnosis not present

## 2023-04-30 DIAGNOSIS — I82403 Acute embolism and thrombosis of unspecified deep veins of lower extremity, bilateral: Secondary | ICD-10-CM

## 2023-05-12 ENCOUNTER — Other Ambulatory Visit: Payer: Self-pay | Admitting: Family Medicine

## 2023-05-12 DIAGNOSIS — M7989 Other specified soft tissue disorders: Secondary | ICD-10-CM

## 2023-05-13 ENCOUNTER — Ambulatory Visit (INDEPENDENT_AMBULATORY_CARE_PROVIDER_SITE_OTHER): Payer: 59

## 2023-05-13 ENCOUNTER — Ambulatory Visit (INDEPENDENT_AMBULATORY_CARE_PROVIDER_SITE_OTHER): Payer: 59 | Admitting: Family Medicine

## 2023-05-13 ENCOUNTER — Encounter: Payer: Self-pay | Admitting: Family Medicine

## 2023-05-13 VITALS — BP 138/80 | HR 100 | Temp 98.5°F | Ht 76.0 in | Wt 201.8 lb

## 2023-05-13 DIAGNOSIS — M160 Bilateral primary osteoarthritis of hip: Secondary | ICD-10-CM | POA: Diagnosis not present

## 2023-05-13 DIAGNOSIS — Z23 Encounter for immunization: Secondary | ICD-10-CM | POA: Diagnosis not present

## 2023-05-13 DIAGNOSIS — F17200 Nicotine dependence, unspecified, uncomplicated: Secondary | ICD-10-CM

## 2023-05-13 DIAGNOSIS — F331 Major depressive disorder, recurrent, moderate: Secondary | ICD-10-CM | POA: Diagnosis not present

## 2023-05-13 DIAGNOSIS — Z1211 Encounter for screening for malignant neoplasm of colon: Secondary | ICD-10-CM | POA: Diagnosis not present

## 2023-05-13 DIAGNOSIS — M25551 Pain in right hip: Secondary | ICD-10-CM

## 2023-05-13 DIAGNOSIS — I10 Essential (primary) hypertension: Secondary | ICD-10-CM

## 2023-05-13 DIAGNOSIS — Z981 Arthrodesis status: Secondary | ICD-10-CM | POA: Diagnosis not present

## 2023-05-13 MED ORDER — VARENICLINE TARTRATE 1 MG PO TABS
1.0000 mg | ORAL_TABLET | Freq: Two times a day (BID) | ORAL | 2 refills | Status: DC
Start: 2023-05-13 — End: 2023-08-14

## 2023-05-13 MED ORDER — DULOXETINE HCL 20 MG PO CPEP: 20.0000 mg | ORAL_CAPSULE | Freq: Every day | ORAL | 3 refills | Status: DC

## 2023-05-13 MED ORDER — VARENICLINE TARTRATE (STARTER) 0.5 MG X 11 & 1 MG X 42 PO TBPK
ORAL_TABLET | ORAL | 0 refills | Status: DC
Start: 2023-05-13 — End: 2023-08-14

## 2023-05-13 MED ORDER — HYDROCHLOROTHIAZIDE 25 MG PO TABS
25.0000 mg | ORAL_TABLET | Freq: Every day | ORAL | 1 refills | Status: DC
Start: 2023-05-13 — End: 2023-10-16

## 2023-05-13 MED ORDER — DULOXETINE HCL 60 MG PO CPEP: 60.0000 mg | ORAL_CAPSULE | Freq: Every day | ORAL | 1 refills | Status: DC

## 2023-05-13 NOTE — Patient Instructions (Addendum)
Check blood pressure at home daily when your pain level is the lowest -- your goal is to have blood pressure less than 140/90. Please write them down and bring them to me to the next visit.

## 2023-05-13 NOTE — Progress Notes (Unsigned)
Established Patient Office Visit  Subjective   Patient ID: [name] [name], male    DOB: [birth year]  Age: 72 y.o. MRN: [id number]  Chief Complaint  Patient presents with  . Medical Management of Chronic Issues    Patient is here for follow up of his chronic medical issues.   States that he had his back surgery, states that it went well. But before the surgery he was reporting that he had fallen a couple of times on his right hip. States that he felt he couldn't catch himself with his upper body, states that he was very worried about the loss of strength. States that he has a follow up appointment on Thursday with Dr. Noel Gerold, states that he thinks he was already referred for physical therapy from his office. Hasn't started it yet.   Smoking-- patient wants to quit smoking, he reports he has tried wellbutrin in the past.   Current Outpatient Medications  Medication Instructions  . albuterol (PROVENTIL HFA;VENTOLIN HFA) 108 (90 Base) MCG/ACT inhaler 2 puffs, Inhalation, Every 6 hours PRN  . albuterol (PROVENTIL) 2.5 mg, Nebulization, Every 6 hours PRN  . ARIPiprazole (ABILIFY) 10 mg, Oral, Daily  . atorvastatin (LIPITOR) 20 mg, Oral, Daily  . cyclobenzaprine (FLEXERIL) 10 mg, Oral, 3 times daily PRN  . DULoxetine (CYMBALTA) 20 mg, Oral, Daily  . DULoxetine (CYMBALTA) 60 mg, Oral, Daily  . hydrochlorothiazide (HYDRODIURIL) 25 mg, Oral, Daily  . metoprolol succinate (TOPROL-XL) 25 mg, Oral, Daily  . montelukast (SINGULAIR) 10 mg, Oral, Daily  . NIFEdipine (PROCARDIA XL/NIFEDICAL XL) 120 mg, Oral, Daily  . oxyCODONE-acetaminophen (PERCOCET) 10-325 MG tablet 1 tablet, Oral, Every 6 hours PRN  . rivaroxaban (XARELTO) 20 mg, Oral, Daily with supper, Please follow up with Primary Care MD for future refills  . tamsulosin (FLOMAX) 0.4 mg, Oral, Daily  . varenicline (CHANTIX) 1 mg, Oral, 2 times daily  . Varenicline Tartrate, Starter, (CHANTIX STARTING MONTH PAK) 0.5 MG X 11 & 1 MG X 42 TBPK  Take one 0.5 mg tablet by mouth once daily for 3 days, then increase to one 0.5 mg tablet twice daily for 4 days, then increase to one 1 mg tablet twice daily.    Patient Active Problem List   Diagnosis Date Noted  . Lumbar radiculopathy 03/11/2023  . Spinal stenosis of lumbar region 02/28/2023  . Essential hypertension 02/28/2023  . History of DVT (deep vein thrombosis) 02/28/2023  . Hyperlipidemia 02/28/2023  . Depression with anxiety 02/28/2023  . Microcytic anemia 02/28/2023  . Renal mass; right 01/08/2023  . 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 BPH with obstruction/lower urinary tract symptoms 01/08/2023  . Chronic prostatitis 01/08/2023  . Lesion of right native kidney 01/03/2023  . Tick bite of right thigh 01/03/2023  . Moderate episode of recurrent major depressive disorder (HCC) 01/03/2023  . Right flank pain 05/01/2021  . Chronic right-sided back pain 05/01/2021  . Chronic diarrhea 05/01/2021  . Primary osteoarthritis of left knee 03/29/2020  . Primary osteoarthritis of right knee 07/08/2017  . Complete rotator cuff tear of left shoulder 10/21/2016  . Gout attack 04/02/2016  . Angina pectoris (HCC) 08/01/2014  . Hemorrhage of rectum and anus 03/28/2014  . Neuropathy, peripheral 05/15/2012  . Pulmonary embolism (HCC) 07/11/2011  . DVT (deep venous thrombosis) (HCC) 07/11/2011  . Abdominal pain, left lower quadrant 01/29/2011  . Long term current use of anticoagulant therapy 01/29/2011  . Diarrhea 01/29/2011      Review of Systems  All other systems  reviewed and are negative.     Objective:     BP 138/80 (BP Location: Left Arm, Patient Position: Sitting, Cuff Size: Large)   Pulse 100   Temp 98.5 F (36.9 C) (Oral)   Ht 6\' 4"  (1.93 m)   Wt 201 lb 12.8 oz (91.5 kg)   SpO2 98%   BMI 24.56 kg/m  {Vitals History (Optional):23777}  Physical Exam Vitals reviewed.  Constitutional:      Appearance: Normal appearance. He is well-groomed and normal weight.  Eyes:      Extraocular Movements: Extraocular movements intact.     Conjunctiva/sclera: Conjunctivae normal.  Neck:     Thyroid: No thyromegaly.  Cardiovascular:     Rate and Rhythm: Normal rate and regular rhythm.     Heart sounds: S1 normal and S2 normal. No murmur heard. Pulmonary:     Effort: Pulmonary effort is normal.     Breath sounds: Normal breath sounds and air entry. No rales.  Abdominal:     General: Abdomen is flat. Bowel sounds are normal.  Musculoskeletal:     Right lower leg: No edema.     Left lower leg: No edema.  Neurological:     General: No focal deficit present.     Mental Status: He is alert and oriented to person, place, and time.     Gait: Gait is intact.  Psychiatric:        Mood and Affect: Mood and affect normal.     No results found for any visits on 05/13/23.  {Labs (Optional):23779}  The ASCVD Risk score (Arnett DK, et al., 2019) failed to calculate for the following reasons:   Cannot find a previous HDL lab   Cannot find a previous total cholesterol lab    Assessment & Plan:  Need for immunization against influenza -     Flu Vaccine Trivalent High Dose (Fluad)  Right hip pain -     DG HIP UNILAT W OR W/O PELVIS 2-3 VIEWS RIGHT; Future  Moderate episode of recurrent major depressive disorder (HCC) -     DULoxetine HCl; Take 1 capsule (20 mg total) by mouth daily.  Dispense: 90 capsule; Refill: 3 -     DULoxetine HCl; Take 1 capsule (60 mg total) by mouth daily.  Dispense: 90 capsule; Refill: 1  Tobacco dependence -     Varenicline Tartrate (Starter); Take one 0.5 mg tablet by mouth once daily for 3 days, then increase to one 0.5 mg tablet twice daily for 4 days, then increase to one 1 mg tablet twice daily.  Dispense: 53 each; Refill: 0 -     Varenicline Tartrate; Take 1 tablet (1 mg total) by mouth 2 (two) times daily.  Dispense: 30 tablet; Refill: 2 -     CT CHEST LUNG CANCER SCREENING LOW DOSE WO CONTRAST; Future  Colon cancer screening -      Ambulatory referral to Gastroenterology  Primary hypertension -     hydroCHLOROthiazide; Take 1 tablet (25 mg total) by mouth daily.  Dispense: 90 tablet; Refill: 1     No follow-ups on file.    [name], MD

## 2023-05-15 DIAGNOSIS — M25551 Pain in right hip: Secondary | ICD-10-CM | POA: Insufficient documentation

## 2023-05-15 DIAGNOSIS — F17200 Nicotine dependence, unspecified, uncomplicated: Secondary | ICD-10-CM | POA: Insufficient documentation

## 2023-05-15 DIAGNOSIS — M791 Myalgia, unspecified site: Secondary | ICD-10-CM | POA: Diagnosis not present

## 2023-05-15 NOTE — Assessment & Plan Note (Signed)
Current hypertension medications:       Sig   hydrochlorothiazide (HYDRODIURIL) 25 MG tablet (Taking) Take 1 tablet (25 mg total) by mouth daily.   metoprolol succinate (TOPROL-XL) 25 MG 24 hr tablet (Taking) Take 25 mg by mouth daily.   NIFEdipine (PROCARDIA XL/NIFEDICAL XL) 60 MG 24 hr tablet (Taking) Take 120 mg by mouth daily.      Chronic, stable. BP is controlled on the current medications which I extensively reviewed with him. Will continue these as prescribed.

## 2023-05-15 NOTE — Assessment & Plan Note (Addendum)
I spent 15 minutes discussing smoking cessation today, will start patient on chantix. Patient will follow up with me in 3 months to check on his progress.

## 2023-05-15 NOTE — Assessment & Plan Note (Signed)
Chronic, stable on the current medications, abilify 10 mg daily and duloxetine 80 mg daily. Will refill these for him.

## 2023-05-15 NOTE — Assessment & Plan Note (Signed)
Pt reports that since his surgery his back pain is better, but that he still feels weakness in the muscle diffusely, also reporting right hip pain since he fell after his surgery. I will start with hip x-rays and he may need to see an orthopedist depending on the results. He is going to be starting PT soon and will be following up with his surgeon soon.

## 2023-05-19 ENCOUNTER — Encounter: Payer: Self-pay | Admitting: Family Medicine

## 2023-05-30 ENCOUNTER — Encounter: Payer: Self-pay | Admitting: Orthopaedic Surgery

## 2023-05-30 ENCOUNTER — Ambulatory Visit (INDEPENDENT_AMBULATORY_CARE_PROVIDER_SITE_OTHER): Payer: 59 | Admitting: Orthopaedic Surgery

## 2023-05-30 DIAGNOSIS — M25551 Pain in right hip: Secondary | ICD-10-CM

## 2023-05-30 DIAGNOSIS — M170 Bilateral primary osteoarthritis of knee: Secondary | ICD-10-CM

## 2023-05-30 MED ORDER — BUPIVACAINE HCL 0.5 % IJ SOLN
2.0000 mL | INTRAMUSCULAR | Status: AC | PRN
Start: 2023-05-30 — End: 2023-05-30
  Administered 2023-05-30: 2 mL via INTRA_ARTICULAR

## 2023-05-30 MED ORDER — METHYLPREDNISOLONE ACETATE 40 MG/ML IJ SUSP
40.0000 mg | INTRAMUSCULAR | Status: AC | PRN
Start: 2023-05-30 — End: 2023-05-30
  Administered 2023-05-30: 40 mg via INTRA_ARTICULAR

## 2023-05-30 MED ORDER — LIDOCAINE HCL 1 % IJ SOLN
2.0000 mL | INTRAMUSCULAR | Status: AC | PRN
Start: 2023-05-30 — End: 2023-05-30
  Administered 2023-05-30: 2 mL

## 2023-05-30 NOTE — Progress Notes (Signed)
Office Visit Note   Patient: [name] [name]           Date of Birth: 09-04-1951           MRN: [id number] Visit Date: 05/30/2023              Requested by: [name], MD [address] [place],  [place] [address] PCP: [name], MD   Assessment & Plan: Visit Diagnoses:  1. Bilateral primary osteoarthritis of knee   2. Right hip pain     Plan: Impression is mild degenerative changes of the right hip joint and advanced degenerative changes to both knees.  In regards to the hip, we have discussed referral to Dr. Shon Baton for ultrasound-guided cortisone injection.  In regards to the knees, he is not currently interested in knee replacement surgery.  He would like to proceed with bilateral knee cortisone injections today.  Left knee was aspirated today.  Follow-up as needed.  Follow-Up Instructions: Return if symptoms worsen or fail to improve.   Orders:  Orders Placed This Encounter  Procedures   AMB referral to sports medicine   No orders of the defined types were placed in this encounter.     Procedures: Large Joint Inj: bilateral knee on 05/30/2023 11:40 AM Indications: pain Details: 22 G needle  Arthrogram: No  Medications (Right): 2 mL lidocaine 1 %; 2 mL bupivacaine 0.5 %; 40 mg methylPREDNISolone acetate 40 MG/ML Medications (Left): 2 mL lidocaine 1 %; 2 mL bupivacaine 0.5 %; 40 mg methylPREDNISolone acetate 40 MG/ML Outcome: tolerated well, no immediate complications Patient was prepped and draped in the usual sterile fashion.       Clinical Data: No additional findings.   Subjective: Chief Complaint  Patient presents with   Right Knee - Pain   Left Knee - Pain    HPI patient is a pleasant 72 year old gentleman who comes in today with bilateral knee pain left greater than right in addition to right hip pain.  He has had bilateral knee pain for years which has progressively worsened.  He has undergone cortisone injections in the past  with temporary relief.  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  In regards to his right hip, he has had pain for the past 2 to 3 months which started prior to revision laminectomy L3 5 he had on 03/18/2023 with Dr. Noel Gerold.  The pain he has is to the groin and radiates into the right buttock.  Symptoms are intermittent but worse when he is twisting.  He is on oxycodone which does not seem to help his hip pain.  No previous cortisone injection to the right hip joint.  Review of Systems as detailed in HPI.  All others reviewed and are negative.   Objective: Vital Signs: There were no vitals taken for this visit.  Physical Exam well-developed well-nourished gentleman in no acute distress.  Alert and oriented x 3.  Ortho Exam bilateral knee exam: Range of motion 0 to 110 degrees.  Medial joint line tenderness.  He is neurovascularly intact distally.  Right hip exam reveals pain with logroll, FADIR and Stinchfield testing.  He is neurovascularly intact distally.  Specialty Comments:  No specialty comments available.  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   BPH with obstruction/lower urinary tract symptoms 01/08/2023   Chronic prostatitis 01/08/2023   Lesion of right native kidney 01/03/2023   Tick bite of right thigh 01/03/2023   Moderate episode of recurrent major depressive disorder (HCC) 01/03/2023   Right flank pain 05/01/2021   Chronic right-sided back pain 05/01/2021    Chronic diarrhea 05/01/2021   Primary osteoarthritis of left knee 03/29/2020   Primary osteoarthritis of right knee 07/08/2017   Complete rotator cuff tear of left shoulder 10/21/2016   Gout attack 04/02/2016   Angina pectoris (HCC) 08/01/2014   Hemorrhage of rectum and anus 03/28/2014   Neuropathy, peripheral 05/15/2012   Pulmonary embolism (HCC) 07/11/2011   DVT (deep venous thrombosis) (HCC) 07/11/2011   Abdominal pain, left lower quadrant 01/29/2011   Long term current use of anticoagulant therapy 01/29/2011   Diarrhea 01/29/2011   Past Medical History:  Diagnosis Date   Anxiety and depression    Back pain    Diverticulitis    Diverticulosis    DVT (deep venous thrombosis) (HCC)    Gout    Hernia    History of blood clots    Hypertension    Lesion of right native kidney 01/03/2023   Nausea and vomiting    Neuropathy, peripheral 05/15/2012   patient denies   Shingles     Family History  Problem Relation Age of Onset   Prostate cancer Father    Heart disease Father    Diabetes Mother    Heart disease Mother    Colon cancer Maternal Uncle        dx in his 98's   Pancreatic cancer Paternal Uncle    Prostate cancer Paternal Uncle    Multiple sclerosis Daughter    Prostate cancer Paternal Uncle    Esophageal cancer Neg Hx    Rectal cancer Neg Hx    Stomach cancer Neg Hx     Past Surgical History:  Procedure Laterality Date   APPENDECTOMY     BACK SURGERY     CHOLECYSTECTOMY  09/09/2006   HERNIA REPAIR     HIATAL HERNIA REPAIR     IR ANGIO INTRA EXTRACRAN SEL INTERNAL CAROTID BILAT MOD SED  01/18/2020   IR ANGIO VERTEBRAL SEL VERTEBRAL BILAT MOD SED  01/18/2020   IR US GUIDE VASC ACCESS RIGHT  01/18/2020   LEFT HEART CATHETERIZATION WITH CORONARY ANGIOGRAM N/A 08/02/2014   Procedure: LEFT HEART CATHETERIZATION WITH CORONARY ANGIOGRAM;  Surgeon: Pamella Pert, MD;  Location: [facility] CATH LAB;  Service: Cardiovascular;  Laterality: N/A;   MULTIPLE EXTRACTIONS WITH  ALVEOLOPLASTY N/A 10/19/2015   Procedure: Extraction of tooth #'s 2,12,13 with alveoloplasty;  Surgeon: Charlynne Pander, DDS;  Location: [facility] OR;  Service: Oral Surgery;  Laterality: N/A;   SACRAL NERVE STIMULATOR PLACEMENT  09/09/2009   [employer] [phone] MRI info-[email or web address]   Social History   Occupational History   Occupation: unemployed    Associate Professor: OTHER    Employer: RETIRED  Tobacco Use   Smoking status: Every Day    Current packs/day: 0.50    Average packs/day: 0.5 packs/day for 40.0 years (20.0 ttl pk-yrs)    Types: Cigarettes   Smokeless tobacco: Never  Vaping Use   Vaping status: Never Used  Substance and Sexual Activity   Alcohol use: Yes    Alcohol/week: 0.0 standard drinks of alcohol    Comment: Occasional   Drug use: Yes    Types: Marijuana    Comment: for nausea   Sexual activity: Not on file

## 2023-06-02 ENCOUNTER — Ambulatory Visit
Admission: RE | Admit: 2023-06-02 | Discharge: 2023-06-02 | Disposition: A | Discharge status: Home / Self Care | Payer: 59 | Source: Ambulatory Visit | Attending: Family Medicine | Admitting: Family Medicine

## 2023-06-02 DIAGNOSIS — F17210 Nicotine dependence, cigarettes, uncomplicated: Secondary | ICD-10-CM | POA: Diagnosis not present

## 2023-06-02 DIAGNOSIS — F17200 Nicotine dependence, unspecified, uncomplicated: Secondary | ICD-10-CM

## 2023-06-09 ENCOUNTER — Encounter: Payer: Self-pay | Admitting: Urology

## 2023-06-09 ENCOUNTER — Ambulatory Visit (INDEPENDENT_AMBULATORY_CARE_PROVIDER_SITE_OTHER): Payer: 59 | Admitting: Urology

## 2023-06-09 VITALS — BP 208/115 | HR 98 | Ht 76.0 in | Wt 202.0 lb

## 2023-06-09 DIAGNOSIS — N138 Other obstructive and reflux uropathy: Secondary | ICD-10-CM | POA: Diagnosis not present

## 2023-06-09 DIAGNOSIS — N281 Cyst of kidney, acquired: Secondary | ICD-10-CM

## 2023-06-09 DIAGNOSIS — N529 Male erectile dysfunction, unspecified: Secondary | ICD-10-CM

## 2023-06-09 DIAGNOSIS — N411 Chronic prostatitis: Secondary | ICD-10-CM | POA: Diagnosis not present

## 2023-06-09 DIAGNOSIS — N50811 Right testicular pain: Secondary | ICD-10-CM

## 2023-06-09 DIAGNOSIS — N401 Enlarged prostate with lower urinary tract symptoms: Secondary | ICD-10-CM | POA: Diagnosis not present

## 2023-06-09 MED ORDER — ALFUZOSIN HCL ER 10 MG PO TB24: 10.0000 mg | ORAL_TABLET | Freq: Every day | ORAL | 11 refills | Status: DC

## 2023-06-09 NOTE — Progress Notes (Signed)
Assessment: 1. 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 BPH with obstruction/lower urinary tract symptoms   3. Organic impotence   4. Chronic prostatitis   5. Pain in right testicle     Plan: Trial of alfuzosin 10 mg daily in place of tamsulosin.  Rx sent. Scrotal U/S for evaluation of right testicular pain The cause of his right inguinal pain is unclear.  This could be due to some scar tissue after his hernia surgery or possibly neurologic secondary to his back issues.   Return to office in 1 month  Chief Complaint:  Chief Complaint  Patient presents with   Renal cyst    History of Present Illness:  [name] [name] is a 72 y.o. male who is seen for further evaluation of right renal mass, flank pain, and dysuria.  He has a history of right-sided flank pain for approximately 1 year.  He underwent back surgery in April 2023.  He continued to have right-sided flank pain. MRI of the lumbar spine from 9/23 showed 2 indeterminate right renal lesions possibly reflecting hemorrhagic cysts.  No additional imaging has been performed since that time.  Prior CT study from 8/22 did not show any obvious renal lesions.  He was seen by Dr. Sabino Gasser at [facility] in February 2024 for evaluation of prostatitis, BPH with lower urinary tract symptoms, and erectile dysfunction.  He has a history of prostatitis requiring 1 hospitalization in the past.  In February 2024, he was having symptoms of dysuria, frequency, urgency, nocturia, and incomplete emptying.  He also had some right flank pain.  He was diagnosed with prostatitis and treated with Cipro x 10 days. He also has a history of erectile dysfunction.  Sildenafil has been ineffective for him.  He was given a prescription for tadalafil 20 mg prn. PSA 2/24:  0.34  He continued to have lower urinary tract symptoms including urgency, nocturia x 4, and dysuria.  He had pain during and after urination.  The burning lasts for approximately 20  minutes and resolves.  No gross hematuria.  No recent UTIs.  He did not see any improvement in his symptoms with the Cipro. IPSS = 21. He reported a 50 lb weight loss in the past 6 months.  He was diagnosed with prostatitis and treated with Bactrim DS x 21 days.  He was also started on tamsulosin 0.4 mg daily.  He had not had his MRI due to concerns about his ability to tolerate study given his prior experience. He completed the antibiotics and he continued on tamsulosin.  He continued to have urgency, nocturia 5-6 times, and dysuria.  He had not seen any significant improvement with current treatment.    CT abdomen with and without contrast from 02/15/2023 showed bilateral renal cyst, consistent with Bosniak 2 lesions, no solid masses or suspicious lesions.  He was also noted to have a hypoenhancing splenic lesion.  At his visit in July 2024, he noted a return of the dysuria, intermittent in nature.  He also had some urinary frequency and urgency.  He continued on tamsulosin.  No gross hematuria or flank pain. IPSS = 15. He was treated with Levaquin x 2 weeks for prostatitis.  He returns today for follow-up.  He reported some improvement in his symptoms following treatment with Levaquin.  His urinary symptoms returned approximately 2 weeks later.  He currently reports symptoms of frequency, nocturia x 4, intermittent stream, and urgency.  No dysuria or gross hematuria.  He continues on tamsulosin.  He also reports pain in the right inguinal area.  This has been present for several years and began after his last hernia surgery.  He also complains of pain in the right testicle. IPSS = 17 today  Portions of the above documentation were copied from a prior visit for review purposes only.  Past Medical History:  Past Medical History:  Diagnosis Date   Anxiety and depression    Back pain    Diverticulitis    Diverticulosis    DVT (deep venous thrombosis) (HCC)    Gout    Hernia    History of  blood clots    Hypertension    Lesion of right native kidney 01/03/2023   Nausea and vomiting    Neuropathy, peripheral 05/15/2012   patient denies   Shingles     Past Surgical History:  Past Surgical History:  Procedure Laterality Date   APPENDECTOMY     BACK SURGERY     CHOLECYSTECTOMY  09/09/2006   HERNIA REPAIR     HIATAL HERNIA REPAIR     IR ANGIO INTRA EXTRACRAN SEL INTERNAL CAROTID BILAT MOD SED  01/18/2020   IR ANGIO VERTEBRAL SEL VERTEBRAL BILAT MOD SED  01/18/2020   IR US GUIDE VASC ACCESS RIGHT  01/18/2020   LEFT HEART CATHETERIZATION WITH CORONARY ANGIOGRAM N/A 08/02/2014   Procedure: LEFT HEART CATHETERIZATION WITH CORONARY ANGIOGRAM;  Surgeon: Pamella Pert, MD;  Location: [facility] CATH LAB;  Service: Cardiovascular;  Laterality: N/A;   MULTIPLE EXTRACTIONS WITH ALVEOLOPLASTY N/A 10/19/2015   Procedure: Extraction of tooth #'s 2,12,13 with alveoloplasty;  Surgeon: Charlynne Pander, DDS;  Location: [facility] OR;  Service: Oral Surgery;  Laterality: N/A;   SACRAL NERVE STIMULATOR PLACEMENT  09/09/2009   [employer] [phone] MRI info-[email or web address]    Allergies:  Allergies  Allergen Reactions   Nsaids     Other Reaction(s): GI Intolerance, Other (See Comments)   Aspirin Other (See Comments)    ulcers   Celecoxib Other (See Comments)    Stomach irritation   Ibuprofen Other (See Comments)    ulcers   Lyrica [Pregabalin] Swelling   Vicodin [Hydrocodone-Acetaminophen]     Makes me mean     Family History:  Family History  Problem Relation Age of Onset   Prostate cancer Father    Heart disease Father    Diabetes Mother    Heart disease Mother    Colon cancer Maternal Uncle        dx in his 61's   Pancreatic cancer Paternal Uncle    Prostate cancer Paternal Uncle    Multiple sclerosis Daughter    Prostate cancer Paternal Uncle    Esophageal cancer Neg Hx    Rectal cancer Neg Hx    Stomach  cancer Neg Hx     Social History:  Social History   Tobacco Use   Smoking status: Every Day    Current packs/day: 0.50    Average packs/day: 0.5 packs/day for 40.0 years (20.0 ttl pk-yrs)    Types: Cigarettes   Smokeless tobacco: Never  Vaping Use   Vaping status: Never Used  Substance Use Topics   Alcohol use: Yes    Alcohol/week: 0.0 standard drinks of alcohol    Comment: Occasional   Drug use: Yes    Types: Marijuana    Comment: for nausea    ROS: Constitutional:  Negative for fever, chills, weight loss CV: Negative for chest pain, previous MI, hypertension Respiratory:  Negative for shortness of breath, wheezing, sleep apnea, frequent cough GI:  Negative for nausea, vomiting, bloody stool, GERD  Physical exam: BP (!) 208/115   Pulse 98   Ht 6\' 4"  (1.93 m)   Wt 202 lb (91.6 kg)   BMI 24.59 kg/m  GENERAL APPEARANCE:  Well appearing, well developed, well nourished, NAD HEENT:  Atraumatic, normocephalic, oropharynx clear NECK:  Supple without lymphadenopathy or thyromegaly ABDOMEN:  Soft, non-tender, no masses EXTREMITIES:  Moves all extremities well, without clubbing, cyanosis, or edema NEUROLOGIC:  Alert and oriented x 3, normal gait, CN II-XII grossly intact MENTAL STATUS:  appropriate BACK:  Non-tender to palpation, No CVAT SKIN:  Warm, dry, and intact GU: Penis:  redundant foreskin; no lesions seen Meatus: Normal Scrotum: normal, no masses; probable right inguinal hernia Testis: right testis small, minimally tender to palpation; left normal   Results: U/A:0 WBC, 0-2 RBC

## 2023-06-10 ENCOUNTER — Other Ambulatory Visit: Payer: Self-pay

## 2023-06-10 ENCOUNTER — Ambulatory Visit (INDEPENDENT_AMBULATORY_CARE_PROVIDER_SITE_OTHER): Payer: 59 | Admitting: Sports Medicine

## 2023-06-10 ENCOUNTER — Encounter: Payer: Self-pay | Admitting: Sports Medicine

## 2023-06-10 DIAGNOSIS — M1611 Unilateral primary osteoarthritis, right hip: Secondary | ICD-10-CM

## 2023-06-10 DIAGNOSIS — M25551 Pain in right hip: Secondary | ICD-10-CM

## 2023-06-10 LAB — URINALYSIS, ROUTINE W REFLEX MICROSCOPIC
Bilirubin, UA: NEGATIVE
Glucose, UA: NEGATIVE
Leukocytes,UA: NEGATIVE
Nitrite, UA: NEGATIVE
RBC, UA: NEGATIVE
Specific Gravity, UA: 1.025 (ref 1.005–1.030)
Urobilinogen, Ur: 0.2 mg/dL (ref 0.2–1.0)
pH, UA: 6 (ref 5.0–7.5)

## 2023-06-10 LAB — MICROSCOPIC EXAMINATION

## 2023-06-10 MED ORDER — METHYLPREDNISOLONE ACETATE 40 MG/ML IJ SUSP
80.0000 mg | INTRAMUSCULAR | Status: AC | PRN
Start: 2023-06-10 — End: 2023-06-10
  Administered 2023-06-10: 80 mg via INTRA_ARTICULAR

## 2023-06-10 MED ORDER — LIDOCAINE HCL 1 % IJ SOLN
4.0000 mL | INTRAMUSCULAR | Status: AC | PRN
Start: 2023-06-10 — End: 2023-06-10
  Administered 2023-06-10: 4 mL

## 2023-06-10 NOTE — Progress Notes (Signed)
Procedure Note  Patient: [name] [name]             Date of Birth: April 24, 1951           MRN: [id number]             Visit Date: 06/10/2023  Procedures: Visit Diagnoses:  1. Right hip pain   2. Unilateral primary osteoarthritis, right hip    Large Joint Inj: R hip joint on 06/10/2023 1:51 PM Indications: pain Details: 22 G 3.5 in needle, ultrasound-guided anterior approach Medications: 4 mL lidocaine 1 %; 80 mg methylPREDNISolone acetate 40 MG/ML Outcome: tolerated well, no immediate complications  Procedure: US-guided intra-articular hip injection, right After discussion on risks/benefits/indications and informed verbal consent was obtained, a timeout was performed. Patient was lying supine on exam table. The hip was cleaned with betadine and alcohol swabs. Then utilizing ultrasound guidance, the patient's femoral head and neck junction was identified and subsequently injected with 4:2 lidocaine:depomedrol via an in-plane approach with ultrasound visualization of the injectate administered into the hip joint. Patient tolerated procedure well without immediate complications.  Procedure, treatment alternatives, risks and benefits explained, specific risks discussed. Consent was given by the patient. Immediately prior to procedure a time out was called to verify the correct patient, procedure, equipment, support staff and site/side marked as required. Patient was prepped and draped in the usual sterile fashion.     - patient tolerated well without immediate SE's - follow-up with Dr. Roda Shutters as indicated; I am happy to see them as needed  Madelyn Brunner, DO Primary Care Sports Medicine Physician  [facility] - Orthopedics  This note was dictated using Dragon naturally speaking software and may contain errors in syntax, spelling, or content which have not been identified prior to signing this note.

## 2023-06-11 DIAGNOSIS — M48062 Spinal stenosis, lumbar region with neurogenic claudication: Secondary | ICD-10-CM | POA: Diagnosis not present

## 2023-06-12 ENCOUNTER — Ambulatory Visit (HOSPITAL_BASED_OUTPATIENT_CLINIC_OR_DEPARTMENT_OTHER): Payer: 59

## 2023-06-16 ENCOUNTER — Ambulatory Visit (HOSPITAL_BASED_OUTPATIENT_CLINIC_OR_DEPARTMENT_OTHER)
Admission: RE | Admit: 2023-06-16 | Discharge: 2023-06-16 | Disposition: A | Discharge status: Home / Self Care | Payer: 59 | Source: Ambulatory Visit | Attending: Urology | Admitting: Urology

## 2023-06-16 DIAGNOSIS — N50811 Right testicular pain: Secondary | ICD-10-CM | POA: Insufficient documentation

## 2023-06-18 ENCOUNTER — Encounter: Payer: Self-pay | Admitting: Orthopaedic Surgery

## 2023-06-19 ENCOUNTER — Other Ambulatory Visit: Payer: Self-pay

## 2023-06-19 DIAGNOSIS — M25551 Pain in right hip: Secondary | ICD-10-CM

## 2023-06-19 NOTE — Telephone Encounter (Signed)
Please refer to megan williams for back pain.  Thanks.

## 2023-06-26 ENCOUNTER — Encounter: Payer: Self-pay | Admitting: Urology

## 2023-07-02 ENCOUNTER — Ambulatory Visit: Payer: 59 | Admitting: Physical Medicine and Rehabilitation

## 2023-07-04 ENCOUNTER — Ambulatory Visit (INDEPENDENT_AMBULATORY_CARE_PROVIDER_SITE_OTHER): Payer: 59 | Admitting: Physical Medicine and Rehabilitation

## 2023-07-04 ENCOUNTER — Encounter: Payer: Self-pay | Admitting: Physical Medicine and Rehabilitation

## 2023-07-04 VITALS — BP 189/79 | HR 92

## 2023-07-04 DIAGNOSIS — M5416 Radiculopathy, lumbar region: Secondary | ICD-10-CM | POA: Diagnosis not present

## 2023-07-04 DIAGNOSIS — M961 Postlaminectomy syndrome, not elsewhere classified: Secondary | ICD-10-CM | POA: Diagnosis not present

## 2023-07-04 DIAGNOSIS — R1031 Right lower quadrant pain: Secondary | ICD-10-CM | POA: Diagnosis not present

## 2023-07-04 DIAGNOSIS — G894 Chronic pain syndrome: Secondary | ICD-10-CM

## 2023-07-04 NOTE — Progress Notes (Unsigned)
[name] [name] - 72 y.o. male MRN [id number]  Date of birth: 1950/11/06  Office Visit Note: Visit Date: 07/04/2023 PCP: [name], MD Referred by: [name], MD  Subjective: Chief Complaint  Patient presents with  . Lower Back - Pain  . Right Hip - Pain   HPI: [name] [name] is a 72 y.o. male who comes in today Per the request of Dr. Glee Arvin for evaluation of chronic, worsening and severe right sided lower back pain radiating around to groin radiating down to thigh. Pain ongoing for several years, worsens with standing and walking. He describes pain as sharp and stabbing sensation, currently rates as 8 out of 10. States he feels right leg is going to "give out" when walking. Some relief of pain with home exercise regimen, rest and use of medications. He is currently taking Oxycodone prescribed by Lilyan Punt, PA-C with [facility]. Lumbar MRI imaging from 2023 exhibits slightly worsened severe spinal canal stenosis at L3-L4 with cauda equina impingement. Prior L4-L5 posterior fusion without residual stenosis. Significant lumbar surgical history including spinal cord stimulator placement by Dr. Gillie Manners with [facility] in 2020, [place] removal, L4-L5 TLIF and L3-L4 laminectomy with Dr. Sharolyn Douglas in 2023, revision L3-L5 laminectomy with Dr. Noel Gerold in July of this year. He recently underwent right ultrasound guided hip injection with our partner Dr. Madelyn Brunner with minimal relief of pain. States he is working with PCP to schedule regimen of formal physical therapy. He would like to stop taking pain medications as he does not feel these medications are significantly beneficial in alleviating his pain. Patient denies focal weakness, numbness and tingling. He reports mechanical fall in August, no apparent injuries.   Review of Systems  Musculoskeletal:  Positive for back pain.  Neurological:  Negative for tingling, sensory change, focal  weakness and weakness.  All other systems reviewed and are negative.  Otherwise per HPI.  Assessment & Plan: Visit Diagnoses:    ICD-10-CM   1. Lumbar radiculopathy  M54.16     2. Right inguinal pain  R10.31     3. Post laminectomy syndrome  M96.1     4. Chronic pain syndrome  G89.4        Plan: Findings:  Chronic, worsening and severe right sided lower back pain radiating around to groin radiating down to thigh in the setting of post laminectomy and chronic pain syndrome. Patient continues to have severe pain despite good conservative therapies such as home exercise regimen, rest and use of medications. Patients clinical presentation and exam are complex, differentials include lumbar radiculopathy, myofascial pain syndrome and intrinsic right hip issue. Could also be a psoas tendonitis.  Tenderness noted to right lower back upon palpation today. He does not have pain to right groin with internal/external rotation of his hips, minimal relief of pain with recent right hip injection. We discussed treatment plan in detail today, we feel best option is for him to follow up with Dr. Noel Gerold for continued management of his lumbar spine/chronic pain management. I do feel PT would also be a good option. Patient has no questions at this time. No red flag symptoms noted upon exam today.     Meds & Orders: No orders of the defined types were placed in this encounter.  No orders of the defined types were placed in this encounter.   Follow-up: Return if symptoms worsen or fail to improve.   Procedures: No procedures performed  Clinical History: Narrative & Impression CLINICAL DATA:  Low back pain   EXAM: MRI LUMBAR SPINE WITHOUT AND WITH CONTRAST   TECHNIQUE: Multiplanar and multiecho pulse sequences of the lumbar spine were obtained without and with intravenous contrast.   CONTRAST:  8.23mL GADAVIST GADOBUTROL 1 MMOL/ML IV SOLN   COMPARISON:  05/20/2022   FINDINGS: Segmentation:   Standard.   Alignment:  Grade 1 retrolisthesis at L3-4   Vertebrae: L4-5 posterior fusion. No acute fracture. L1 hemangioma.   Conus medullaris and cauda equina: Conus extends to the L2 level. Buckling of the cauda equina above the L3-4 level.   Paraspinal and other soft tissues: Negative   Disc levels:   L1-L2: Normal disc space and facet joints. No spinal canal stenosis. No neural foraminal stenosis.   L2-L3: Small disc bulge with moderate facet hypertrophy. No spinal canal stenosis. No neural foraminal stenosis.   L3-L4: Small disc bulge. Slightly worsened severe spinal canal stenosis. Mild bilateral neural foraminal stenosis.   L4-L5: Postfusion changes. No spinal canal stenosis. No neural foraminal stenosis.   L5-S1: Normal disc space and facet joints. No spinal canal stenosis. No neural foraminal stenosis.   Visualized sacrum: Normal.   IMPRESSION: 1. Slightly worsened severe spinal canal stenosis at L3-L4 with cauda equina impingement. 2. L4-5 posterior fusion without residual spinal canal or neural foraminal stenosis.     Electronically Signed   By: Deatra Robinson M.D.   On: 02/27/2023 20:58   He reports that he has been smoking cigarettes. He has a 20 pack-year smoking history. He has never used smokeless tobacco.  Recent Labs    03/11/23 1617  HGBA1C 5.5    Objective:  VS:  HT:    WT:   BMI:     BP:(!) 189/79  HR:92bpm  TEMP: ( )  RESP:  Physical Exam Vitals and nursing note reviewed.  HENT:     Head: Normocephalic and atraumatic.     Right Ear: External ear normal.     Left Ear: External ear normal.     Nose: Nose normal.     Mouth/Throat:     Mouth: Mucous membranes are moist.  Eyes:     Extraocular Movements: Extraocular movements intact.  Cardiovascular:     Rate and Rhythm: Normal rate.     Pulses: Normal pulses.  Pulmonary:     Effort: Pulmonary effort is normal.  Abdominal:     General: Abdomen is flat. There is no distension.   Musculoskeletal:        General: Tenderness present.     Cervical back: Normal range of motion.     Comments: Patient rises from seated position to standing without difficulty. Good lumbar range of motion. No pain noted with facet loading. 5/5 strength noted with bilateral hip flexion, knee flexion/extension, ankle dorsiflexion/plantarflexion and EHL. No clonus noted bilaterally. No pain upon palpation of greater trochanters. No pain with internal/external rotation of bilateral hips. Sensation intact bilaterally. Tenderness noted to right lumbar paraspinal region upon palpation. Negative slump test bilaterally. Ambulates without aid, gait steady.     Skin:    General: Skin is warm and dry.     Capillary Refill: Capillary refill takes less than 2 seconds.  Neurological:     General: No focal deficit present.     Mental Status: He is alert and oriented to person, place, and time.  Psychiatric:        Mood and Affect: Mood normal.  Behavior: Behavior normal.    Ortho Exam  Imaging: No results found.  Past Medical/Family/Surgical/Social History: Medications & Allergies reviewed per EMR, new medications updated. Patient Active Problem List   Diagnosis Date Noted  . Right hip pain 05/15/2023  . Tobacco dependence 05/15/2023  . Lumbar radiculopathy 03/11/2023  . Spinal stenosis of lumbar region 02/28/2023  . Primary hypertension 02/28/2023  . History of DVT (deep vein thrombosis) 02/28/2023  . Hyperlipidemia 02/28/2023  . Depression with anxiety 02/28/2023  . Microcytic anemia 02/28/2023  . Renal mass; right 01/08/2023  . 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 BPH with obstruction/lower urinary tract symptoms 01/08/2023  . Chronic prostatitis 01/08/2023  . Lesion of right native kidney 01/03/2023  . Tick bite of right thigh 01/03/2023  . Moderate episode of recurrent major depressive disorder (HCC) 01/03/2023  . Right flank pain 05/01/2021  . Chronic right-sided back pain  05/01/2021  . Chronic diarrhea 05/01/2021  . Primary osteoarthritis of left knee 03/29/2020  . Primary osteoarthritis of right knee 07/08/2017  . Complete rotator cuff tear of left shoulder 10/21/2016  . Gout attack 04/02/2016  . Angina pectoris (HCC) 08/01/2014  . Hemorrhage of rectum and anus 03/28/2014  . Neuropathy, peripheral 05/15/2012  . Pulmonary embolism (HCC) 07/11/2011  . DVT (deep venous thrombosis) (HCC) 07/11/2011  . Abdominal pain, left lower quadrant 01/29/2011  . Long term current use of anticoagulant therapy 01/29/2011  . Diarrhea 01/29/2011   Past Medical History:  Diagnosis Date  . Anxiety and depression   . Back pain   . Diverticulitis   . Diverticulosis   . DVT (deep venous thrombosis) (HCC)   . Gout   . Hernia   . History of blood clots   . Hypertension   . Lesion of right native kidney 01/03/2023  . Nausea and vomiting   . Neuropathy, peripheral 05/15/2012   patient denies  . Shingles    Family History  Problem Relation Age of Onset  . Prostate cancer Father   . Heart disease Father   . Diabetes Mother   . Heart disease Mother   . Colon cancer Maternal Uncle        dx in his 25's  . Pancreatic cancer Paternal Uncle   . Prostate cancer Paternal Uncle   . Multiple sclerosis Daughter   . Prostate cancer Paternal Uncle   . Esophageal cancer Neg Hx   . Rectal cancer Neg Hx   . Stomach cancer Neg Hx    Past Surgical History:  Procedure Laterality Date  . APPENDECTOMY    . BACK SURGERY    . CHOLECYSTECTOMY  09/09/2006  . HERNIA REPAIR    . HIATAL HERNIA REPAIR    . IR ANGIO INTRA EXTRACRAN SEL INTERNAL CAROTID BILAT MOD SED  01/18/2020  . IR ANGIO VERTEBRAL SEL VERTEBRAL BILAT MOD SED  01/18/2020  . IR US GUIDE VASC ACCESS RIGHT  01/18/2020  . LEFT HEART CATHETERIZATION WITH CORONARY ANGIOGRAM N/A 08/02/2014   Procedure: LEFT HEART CATHETERIZATION WITH CORONARY ANGIOGRAM;  Surgeon: Pamella Pert, MD;  Location: [facility] CATH LAB;  Service:  Cardiovascular;  Laterality: N/A;  . MULTIPLE EXTRACTIONS WITH ALVEOLOPLASTY N/A 10/19/2015   Procedure: Extraction of tooth #'s 2,12,13 with alveoloplasty;  Surgeon: Charlynne Pander, DDS;  Location: [facility] OR;  Service: Oral Surgery;  Laterality: N/A;  . SACRAL NERVE STIMULATOR PLACEMENT  09/09/2009   Medtronic RestoreADVANCED [id number] MRI info-[email or web address]   Social History   Occupational History  . Occupation: unemployed  Employer: OTHER    Employer: RETIRED  Tobacco Use  . Smoking status: Every Day    Current packs/day: 0.50    Average packs/day: 0.5 packs/day for 40.0 years (20.0 ttl pk-yrs)    Types: Cigarettes  . Smokeless tobacco: Never  Vaping Use  . Vaping status: Never Used  Substance and Sexual Activity  . Alcohol use: Yes    Alcohol/week: 0.0 standard drinks of alcohol    Comment: Occasional  . Drug use: Yes    Types: Marijuana    Comment: for nausea  . Sexual activity: Not on file

## 2023-07-04 NOTE — Progress Notes (Unsigned)
Functional Pain Scale - descriptive words and definitions   Severe (9)  Cannot do any ADL's even with assistance can barely talk/unable to sleep and unable to use distraction. Severe range order  Average Pain 9  LBP and RT hipt pain. Pt is having rt hip pain, seems to been more in the front and goes laterally. He has a hx of LBS, pain did not improver after surgery. He had a few falls a couple months ago.

## 2023-07-07 ENCOUNTER — Encounter: Payer: Self-pay | Admitting: Urology

## 2023-07-07 ENCOUNTER — Ambulatory Visit (INDEPENDENT_AMBULATORY_CARE_PROVIDER_SITE_OTHER): Payer: 59 | Admitting: Urology

## 2023-07-07 VITALS — BP 146/88 | HR 84 | Ht 76.0 in | Wt 205.0 lb

## 2023-07-07 DIAGNOSIS — N529 Male erectile dysfunction, unspecified: Secondary | ICD-10-CM | POA: Diagnosis not present

## 2023-07-07 DIAGNOSIS — N50811 Right testicular pain: Secondary | ICD-10-CM | POA: Diagnosis not present

## 2023-07-07 DIAGNOSIS — N401 Enlarged prostate with lower urinary tract symptoms: Secondary | ICD-10-CM | POA: Diagnosis not present

## 2023-07-07 DIAGNOSIS — N281 Cyst of kidney, acquired: Secondary | ICD-10-CM

## 2023-07-07 DIAGNOSIS — N138 Other obstructive and reflux uropathy: Secondary | ICD-10-CM | POA: Diagnosis not present

## 2023-07-07 LAB — URINALYSIS, ROUTINE W REFLEX MICROSCOPIC
Bilirubin, UA: NEGATIVE
Glucose, UA: NEGATIVE
Leukocytes,UA: NEGATIVE
Nitrite, UA: NEGATIVE
RBC, UA: NEGATIVE
Specific Gravity, UA: 1.025 (ref 1.005–1.030)
Urobilinogen, Ur: 0.2 mg/dL (ref 0.2–1.0)
pH, UA: 6 (ref 5.0–7.5)

## 2023-07-07 LAB — MICROSCOPIC EXAMINATION

## 2023-07-07 NOTE — Progress Notes (Signed)
Assessment: 1. BPH with obstruction/lower urinary tract symptoms   2. 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 Pain in right testicle   4. Organic impotence     Plan: Continue alfuzosin 10 mg daily  I reviewed the scrotal ultrasound from 06/16/2023.  These findings along with his exam appear to be more consistent with chronic changes following his right hernia repair in 2018. The cause of his right inguinal pain remains unclear.   Return to office in 3 months.  Chief Complaint:  Chief Complaint  Patient presents with   Renal cyst    History of Present Illness:  [name] [name] is a 72 y.o. male who is seen for further evaluation of right renal mass, flank pain, and dysuria.  He has a history of right-sided flank pain for approximately 1 year.  He underwent back surgery in April 2023.  He continued to have right-sided flank pain. MRI of the lumbar spine from 9/23 showed 2 indeterminate right renal lesions possibly reflecting hemorrhagic cysts.  No additional imaging has been performed since that time.  Prior CT study from 8/22 did not show any obvious renal lesions.  He was seen by Dr. Sabino Gasser at [facility] in February 2024 for evaluation of prostatitis, BPH with lower urinary tract symptoms, and erectile dysfunction.  He has a history of prostatitis requiring 1 hospitalization in the past.  In February 2024, he was having symptoms of dysuria, frequency, urgency, nocturia, and incomplete emptying.  He also had some right flank pain.  He was diagnosed with prostatitis and treated with Cipro x 10 days. He also has a history of erectile dysfunction.  Sildenafil has been ineffective for him.  He was given a prescription for tadalafil 20 mg prn. PSA 2/24:  0.34  He continued to have lower urinary tract symptoms including urgency, nocturia x 4, and dysuria.  He had pain during and after urination.  The burning lasts for approximately 20 minutes and resolves.  No gross hematuria.  No  recent UTIs.  He did not see any improvement in his symptoms with the Cipro. IPSS = 21. He reported a 50 lb weight loss in the past 6 months.  He was diagnosed with prostatitis and treated with Bactrim DS x 21 days.  He was also started on tamsulosin 0.4 mg daily.  He had not had his MRI due to concerns about his ability to tolerate study given his prior experience. He completed the antibiotics and he continued on tamsulosin.  He continued to have urgency, nocturia 5-6 times, and dysuria.  He had not seen any significant improvement with current treatment.    CT abdomen with and without contrast from 02/15/2023 showed bilateral renal cyst, consistent with Bosniak 2 lesions, no solid masses or suspicious lesions.  He was also noted to have a hypoenhancing splenic lesion.  At his visit in July 2024, he noted a return of the dysuria, intermittent in nature.  He also had some urinary frequency and urgency.  He continued on tamsulosin.  No gross hematuria or flank pain. IPSS = 15. He was treated with Levaquin x 2 weeks for prostatitis.  At his visit in 9/24, he reported some improvement in his symptoms following treatment with Levaquin.  His urinary symptoms returned approximately 2 weeks after completing the antibiotic.  He had symptoms of frequency, nocturia x 4, intermittent stream, and urgency.  No dysuria or gross hematuria.  He continued on tamsulosin.  He also reported continued pain in  the right inguinal area which had been present for several years and began after his last hernia surgery.  He also complained of pain in the right testicle. IPSS = 17. He was given a trial of alfuzosin at his visit in 9/24. Scrotal ultrasound from 06/19/2023 showed a indistinct heterogeneous masslike area in the right testicle measuring 2.0 x 1.6 x 2.5 cm which appears similar to prior study from 04/23/2018.  He returns today for follow-up.  He has noted improvement in his urinary symptoms with alfuzosin.  He is  not having any significant dysuria.  He continues with some frequency, urgency, and nocturia x 4.  He continues to drink Pepsi and tea before bed.  He continues to have some pain in the right groin area, this is relieved with topical anti-inflammatory cream.  He also reports intermittent right sided scrotal pain with palpation and some left-sided scrotal pain.  Portions of the above documentation were copied from a prior visit for review purposes only.  Past Medical History:  Past Medical History:  Diagnosis Date   Anxiety and depression    Back pain    Diverticulitis    Diverticulosis    DVT (deep venous thrombosis) (HCC)    Gout    Hernia    History of blood clots    Hypertension    Lesion of right native kidney 01/03/2023   Nausea and vomiting    Neuropathy, peripheral 05/15/2012   patient denies   Shingles     Past Surgical History:  Past Surgical History:  Procedure Laterality Date   APPENDECTOMY     BACK SURGERY     CHOLECYSTECTOMY  09/09/2006   HERNIA REPAIR     HIATAL HERNIA REPAIR     IR ANGIO INTRA EXTRACRAN SEL INTERNAL CAROTID BILAT MOD SED  01/18/2020   IR ANGIO VERTEBRAL SEL VERTEBRAL BILAT MOD SED  01/18/2020   IR US GUIDE VASC ACCESS RIGHT  01/18/2020   LEFT HEART CATHETERIZATION WITH CORONARY ANGIOGRAM N/A 08/02/2014   Procedure: LEFT HEART CATHETERIZATION WITH CORONARY ANGIOGRAM;  Surgeon: Pamella Pert, MD;  Location: [facility] CATH LAB;  Service: Cardiovascular;  Laterality: N/A;   MULTIPLE EXTRACTIONS WITH ALVEOLOPLASTY N/A 10/19/2015   Procedure: Extraction of tooth #'s 2,12,13 with alveoloplasty;  Surgeon: Charlynne Pander, DDS;  Location: [facility] OR;  Service: Oral Surgery;  Laterality: N/A;   SACRAL NERVE STIMULATOR PLACEMENT  09/09/2009   [employer] RestoreADVANCED [id number] MRI info-[email or web address]    Allergies:  Allergies  Allergen Reactions   Nsaids     Other Reaction(s): GI Intolerance, Other  (See Comments)   Aspirin Other (See Comments)    ulcers   Celecoxib Other (See Comments)    Stomach irritation   Ibuprofen Other (See Comments)    ulcers   Lyrica [Pregabalin] Swelling   Vicodin [Hydrocodone-Acetaminophen]     Makes me mean     Family History:  Family History  Problem Relation Age of Onset   Prostate cancer Father    Heart disease Father    Diabetes Mother    Heart disease Mother    Colon cancer Maternal Uncle        dx in his 11's   Pancreatic cancer Paternal Uncle    Prostate cancer Paternal Uncle    Multiple sclerosis Daughter    Prostate cancer Paternal Uncle    Esophageal cancer Neg Hx    Rectal cancer Neg Hx    Stomach cancer Neg Hx     Social History:  Social  History   Tobacco Use   Smoking status: Every Day    Current packs/day: 0.50    Average packs/day: 0.5 packs/day for 40.0 years (20.0 ttl pk-yrs)    Types: Cigarettes   Smokeless tobacco: Never  Vaping Use   Vaping status: Never Used  Substance Use Topics   Alcohol use: Yes    Alcohol/week: 0.0 standard drinks of alcohol    Comment: Occasional   Drug use: Yes    Types: Marijuana    Comment: for nausea    ROS: Constitutional:  Negative for fever, chills, weight loss CV: Negative for chest pain, previous MI, hypertension Respiratory:  Negative for shortness of breath, wheezing, sleep apnea, frequent cough GI:  Negative for nausea, vomiting, bloody stool, GERD  Physical exam: BP (!) 146/88   Pulse 84   Ht 6\' 4"  (1.93 m)   Wt 205 lb (93 kg)   BMI 24.95 kg/m  GENERAL APPEARANCE:  Well appearing, well developed, well nourished, NAD HEENT:  Atraumatic, normocephalic, oropharynx clear NECK:  Supple without lymphadenopathy or thyromegaly ABDOMEN:  Soft, non-tender, no masses EXTREMITIES:  Moves all extremities well, without clubbing, cyanosis, or edema NEUROLOGIC:  Alert and oriented x 3, normal gait, CN II-XII grossly intact MENTAL STATUS:  appropriate BACK:  Non-tender to  palpation, No CVAT SKIN:  Warm, dry, and intact GU: Penis:  redundant foreskin Meatus: Normal Scrotum: no erythema or edema Testis: small right testicle; minimally tender; normal left, minimally tender    Results: U/A: 0-5 WBC, 0-2 RBC, few bacteria

## 2023-07-09 DIAGNOSIS — H04123 Dry eye syndrome of bilateral lacrimal glands: Secondary | ICD-10-CM | POA: Diagnosis not present

## 2023-07-17 DIAGNOSIS — M4326 Fusion of spine, lumbar region: Secondary | ICD-10-CM | POA: Diagnosis not present

## 2023-07-17 DIAGNOSIS — M5416 Radiculopathy, lumbar region: Secondary | ICD-10-CM | POA: Diagnosis not present

## 2023-07-18 DIAGNOSIS — M549 Dorsalgia, unspecified: Secondary | ICD-10-CM | POA: Diagnosis not present

## 2023-07-18 DIAGNOSIS — R079 Chest pain, unspecified: Secondary | ICD-10-CM | POA: Diagnosis not present

## 2023-07-18 DIAGNOSIS — Z86718 Personal history of other venous thrombosis and embolism: Secondary | ICD-10-CM | POA: Diagnosis not present

## 2023-07-18 DIAGNOSIS — Z885 Allergy status to narcotic agent status: Secondary | ICD-10-CM | POA: Diagnosis not present

## 2023-07-18 DIAGNOSIS — I499 Cardiac arrhythmia, unspecified: Secondary | ICD-10-CM | POA: Diagnosis not present

## 2023-07-18 DIAGNOSIS — Z86711 Personal history of pulmonary embolism: Secondary | ICD-10-CM | POA: Diagnosis not present

## 2023-07-18 DIAGNOSIS — I509 Heart failure, unspecified: Secondary | ICD-10-CM | POA: Diagnosis not present

## 2023-07-18 DIAGNOSIS — G8929 Other chronic pain: Secondary | ICD-10-CM | POA: Diagnosis not present

## 2023-07-18 DIAGNOSIS — Z20822 Contact with and (suspected) exposure to covid-19: Secondary | ICD-10-CM | POA: Diagnosis not present

## 2023-07-18 DIAGNOSIS — E785 Hyperlipidemia, unspecified: Secondary | ICD-10-CM | POA: Diagnosis not present

## 2023-07-18 DIAGNOSIS — Z888 Allergy status to other drugs, medicaments and biological substances status: Secondary | ICD-10-CM | POA: Diagnosis not present

## 2023-07-18 DIAGNOSIS — Z886 Allergy status to analgesic agent status: Secondary | ICD-10-CM | POA: Diagnosis not present

## 2023-07-18 DIAGNOSIS — Z8249 Family history of ischemic heart disease and other diseases of the circulatory system: Secondary | ICD-10-CM | POA: Diagnosis not present

## 2023-07-18 DIAGNOSIS — Z79899 Other long term (current) drug therapy: Secondary | ICD-10-CM | POA: Diagnosis not present

## 2023-07-18 DIAGNOSIS — I110 Hypertensive heart disease with heart failure: Secondary | ICD-10-CM | POA: Diagnosis not present

## 2023-07-18 DIAGNOSIS — R069 Unspecified abnormalities of breathing: Secondary | ICD-10-CM | POA: Diagnosis not present

## 2023-07-18 DIAGNOSIS — I5A Non-ischemic myocardial injury (non-traumatic): Secondary | ICD-10-CM | POA: Diagnosis not present

## 2023-07-18 DIAGNOSIS — R918 Other nonspecific abnormal finding of lung field: Secondary | ICD-10-CM | POA: Diagnosis not present

## 2023-07-18 DIAGNOSIS — Z862 Personal history of diseases of the blood and blood-forming organs and certain disorders involving the immune mechanism: Secondary | ICD-10-CM | POA: Diagnosis not present

## 2023-07-18 DIAGNOSIS — R6889 Other general symptoms and signs: Secondary | ICD-10-CM | POA: Diagnosis not present

## 2023-07-18 DIAGNOSIS — Z743 Need for continuous supervision: Secondary | ICD-10-CM | POA: Diagnosis not present

## 2023-07-18 DIAGNOSIS — R7989 Other specified abnormal findings of blood chemistry: Secondary | ICD-10-CM | POA: Diagnosis not present

## 2023-07-18 DIAGNOSIS — F17210 Nicotine dependence, cigarettes, uncomplicated: Secondary | ICD-10-CM | POA: Diagnosis not present

## 2023-07-18 DIAGNOSIS — E559 Vitamin D deficiency, unspecified: Secondary | ICD-10-CM | POA: Diagnosis not present

## 2023-07-18 DIAGNOSIS — I161 Hypertensive emergency: Secondary | ICD-10-CM | POA: Diagnosis not present

## 2023-07-18 DIAGNOSIS — J441 Chronic obstructive pulmonary disease with (acute) exacerbation: Secondary | ICD-10-CM | POA: Diagnosis not present

## 2023-07-18 DIAGNOSIS — R778 Other specified abnormalities of plasma proteins: Secondary | ICD-10-CM | POA: Diagnosis not present

## 2023-07-18 DIAGNOSIS — I5033 Acute on chronic diastolic (congestive) heart failure: Secondary | ICD-10-CM | POA: Diagnosis not present

## 2023-07-18 DIAGNOSIS — Z7901 Long term (current) use of anticoagulants: Secondary | ICD-10-CM | POA: Diagnosis not present

## 2023-07-18 DIAGNOSIS — J9600 Acute respiratory failure, unspecified whether with hypoxia or hypercapnia: Secondary | ICD-10-CM | POA: Diagnosis not present

## 2023-07-18 DIAGNOSIS — I517 Cardiomegaly: Secondary | ICD-10-CM | POA: Diagnosis not present

## 2023-07-21 ENCOUNTER — Other Ambulatory Visit: Payer: Self-pay | Admitting: Orthopaedic Surgery

## 2023-07-21 DIAGNOSIS — M5416 Radiculopathy, lumbar region: Secondary | ICD-10-CM

## 2023-07-21 DIAGNOSIS — M4326 Fusion of spine, lumbar region: Secondary | ICD-10-CM

## 2023-07-26 ENCOUNTER — Encounter: Payer: Self-pay | Admitting: Family Medicine

## 2023-07-28 ENCOUNTER — Emergency Department (HOSPITAL_COMMUNITY)
Admission: EM | Admit: 2023-07-28 | Discharge: 2023-07-28 | Discharge status: Against Medical Advice | Payer: 59 | Attending: Emergency Medicine | Admitting: Emergency Medicine

## 2023-07-28 DIAGNOSIS — J449 Chronic obstructive pulmonary disease, unspecified: Secondary | ICD-10-CM | POA: Diagnosis not present

## 2023-07-28 DIAGNOSIS — R0602 Shortness of breath: Secondary | ICD-10-CM | POA: Diagnosis not present

## 2023-07-28 DIAGNOSIS — R222 Localized swelling, mass and lump, trunk: Secondary | ICD-10-CM | POA: Insufficient documentation

## 2023-07-28 DIAGNOSIS — Z5321 Procedure and treatment not carried out due to patient leaving prior to being seen by health care provider: Secondary | ICD-10-CM | POA: Diagnosis not present

## 2023-07-28 NOTE — ED Triage Notes (Signed)
Patient had recent admission in [place], [place] for COPD. States during hospitalization he developed abdominal swelling as well as swelling to BLE. Endorses sob, even at rest. Compliant with Lasix.

## 2023-07-30 DIAGNOSIS — M25561 Pain in right knee: Secondary | ICD-10-CM | POA: Diagnosis not present

## 2023-07-30 DIAGNOSIS — M170 Bilateral primary osteoarthritis of knee: Secondary | ICD-10-CM | POA: Diagnosis not present

## 2023-07-30 DIAGNOSIS — M25562 Pain in left knee: Secondary | ICD-10-CM | POA: Diagnosis not present

## 2023-08-14 ENCOUNTER — Ambulatory Visit: Payer: 59 | Admitting: Gastroenterology

## 2023-08-14 ENCOUNTER — Encounter: Payer: Self-pay | Admitting: Family Medicine

## 2023-08-14 ENCOUNTER — Ambulatory Visit: Payer: 59 | Admitting: Family Medicine

## 2023-08-14 VITALS — BP 128/80 | HR 88 | Temp 98.5°F | Ht 76.0 in | Wt 225.6 lb

## 2023-08-14 DIAGNOSIS — J439 Emphysema, unspecified: Secondary | ICD-10-CM | POA: Diagnosis not present

## 2023-08-14 DIAGNOSIS — I10 Essential (primary) hypertension: Secondary | ICD-10-CM | POA: Diagnosis not present

## 2023-08-14 DIAGNOSIS — E559 Vitamin D deficiency, unspecified: Secondary | ICD-10-CM | POA: Diagnosis not present

## 2023-08-14 DIAGNOSIS — D508 Other iron deficiency anemias: Secondary | ICD-10-CM

## 2023-08-14 DIAGNOSIS — F17200 Nicotine dependence, unspecified, uncomplicated: Secondary | ICD-10-CM | POA: Diagnosis not present

## 2023-08-14 MED ORDER — NICOTINE 21 MG/24HR TD PT24: 21.0000 mg | MEDICATED_PATCH | Freq: Every day | TRANSDERMAL | 0 refills | Status: AC

## 2023-08-14 MED ORDER — NICOTINE 7 MG/24HR TD PT24
7.0000 mg | MEDICATED_PATCH | Freq: Every day | TRANSDERMAL | 0 refills | Status: AC
Start: 2023-08-14 — End: ?

## 2023-08-14 MED ORDER — VITAMIN D (ERGOCALCIFEROL) 1.25 MG (50000 UNIT) PO CAPS: 50000.0000 [IU] | ORAL_CAPSULE | ORAL | 1 refills | Status: DC

## 2023-08-14 MED ORDER — BREZTRI AEROSPHERE 160-9-4.8 MCG/ACT IN AERO: 2.0000 | INHALATION_SPRAY | Freq: Two times a day (BID) | RESPIRATORY_TRACT | 11 refills | Status: DC

## 2023-08-14 MED ORDER — IRON (FERROUS SULFATE) 325 (65 FE) MG PO TABS: 325.0000 mg | ORAL_TABLET | ORAL | 5 refills | Status: DC

## 2023-08-14 MED ORDER — NICOTINE 14 MG/24HR TD PT24: 14.0000 mg | MEDICATED_PATCH | Freq: Every day | TRANSDERMAL | 0 refills | Status: AC

## 2023-08-14 NOTE — Progress Notes (Unsigned)
Established Patient Office Visit  Subjective   Patient ID: [name] [name], male    DOB: 1950/10/16  Age: 72 y.o. MRN: [id number]  Chief Complaint  Patient presents with   Back Pain    Patient complains of right sided low back pain x1 month    Patient reports BL leg swelling for about 2 weeks. States that he was hospitalized in [place] for about 3 days on 11/8 -- had a COPD exacerbation and they did an ECHO. Results were:  CONCLUSIONS   * For the indication of heart failure, findings are noted below.    * Left ventricular systolic function is normal with an ejection fraction of 60 % by Simpson's biplane.    * Left ventricular chamber size and wall thickness are normal. No regional wall motion abnormalities. Diastolic function is normal for age.    * Right ventricular chamber dimension is mildly enlarged; systolic function maintained.    * No hemodynamically significant valvular disease.    * No pulmonary hypertension, estimated pulmonary arterial systolic pressure is 28 mmHg.    * No mass, shunts, or thrombi.   Patient reports they stopped the hydrochlorothiazide and was given furosemide 20 mg once daily and reports he still is having swelling despite the use of the furosemide.  Patient stats that he is having a lot of trouble going to sleep at night. States that he does have chronic back pain but he doesn't think that this is the reason for the sleep disturbance. 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   I extensively reviewed his hospital discharge documentation and his labs in the hospital-- he is anemic -- probable iron deficiency and he is also severe vitamin D deficiency.     Current Outpatient Medications  Medication Instructions   albuterol (PROVENTIL HFA;VENTOLIN HFA) 108 (90 Base)  MCG/ACT inhaler 2 puffs, Inhalation, Every 6 hours PRN   albuterol (PROVENTIL) 2.5 mg, Nebulization, Every 6 hours PRN   alfuzosin (UROXATRAL) 10 mg, Oral, Daily with breakfast   ARIPiprazole (ABILIFY) 10 mg, Oral, Daily   atorvastatin (LIPITOR) 20 mg, Oral, Daily   Budeson-Glycopyrrol-Formoterol (BREZTRI AEROSPHERE) 160-9-4.8 MCG/ACT AERO 2 puffs, Inhalation, 2 times daily   cyclobenzaprine (FLEXERIL) 10 mg, Oral, 3 times daily PRN   DULoxetine (CYMBALTA) 20 mg, Oral, Daily   DULoxetine (CYMBALTA) 60 mg, Oral, Daily   furosemide (LASIX) 20 mg, Oral, Daily   hydrALAZINE (APRESOLINE) 50 mg, Oral, 2 times daily   hydrochlorothiazide (HYDRODIURIL) 25 mg, Oral, Daily   Iron (Ferrous Sulfate) 325 mg, Oral, Every other day   metoprolol succinate (TOPROL-XL) 25 mg, Oral, Daily   montelukast (SINGULAIR) 10 mg, Oral, Daily   nicotine (NICODERM CQ - DOSED IN MG/24 HOURS) 21 mg, Transdermal, Daily   nicotine (NICODERM CQ - DOSED IN MG/24 HOURS) 14 mg, Transdermal, Daily   nicotine (NICODERM CQ - DOSED IN MG/24 HR) 7 mg, Transdermal, Daily   NIFEdipine (PROCARDIA XL/NIFEDICAL XL) 120 mg, Oral, Daily   Oxycodone HCl 10 mg, Oral, Every 8 hours PRN   oxyCODONE-acetaminophen (PERCOCET) 10-325 MG tablet 1 tablet, Oral, Every 6 hours PRN   rivaroxaban (XARELTO) 20 mg, Oral, Daily with supper, Please follow up with Primary Care MD for future refills   Vitamin D (  Ergocalciferol) (DRISDOL) 50,000 Units, Oral, Every 7 days    Patient Active Problem List   Diagnosis Date Noted   Right hip pain 05/15/2023   Tobacco dependence 05/15/2023   Lumbar radiculopathy 03/11/2023   Spinal stenosis of lumbar region 02/28/2023   Primary hypertension 02/28/2023   History of DVT (deep vein thrombosis) 02/28/2023   Hyperlipidemia 02/28/2023   Depression with anxiety 02/28/2023   Microcytic anemia 02/28/2023   Renal mass; right 01/08/2023   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   BPH with obstruction/lower urinary tract  symptoms 01/08/2023   Chronic prostatitis 01/08/2023   Lesion of right native kidney 01/03/2023   Tick bite of right thigh 01/03/2023   Moderate episode of recurrent major depressive disorder (HCC) 01/03/2023   Right flank pain 05/01/2021   Chronic right-sided back pain 05/01/2021   Chronic diarrhea 05/01/2021   Primary osteoarthritis of left knee 03/29/2020   Primary osteoarthritis of right knee 07/08/2017   Complete rotator cuff tear of left shoulder 10/21/2016   Gout attack 04/02/2016   Angina pectoris (HCC) 08/01/2014   Hemorrhage of rectum and anus 03/28/2014   Neuropathy, peripheral 05/15/2012   Pulmonary embolism (HCC) 07/11/2011   DVT (deep venous thrombosis) (HCC) 07/11/2011   Abdominal pain, left lower quadrant 01/29/2011   Long term current use of anticoagulant therapy 01/29/2011   Diarrhea 01/29/2011      Review of Systems  All other systems reviewed and are negative.     Objective:     BP 128/80 (BP Location: Left Arm, Patient Position: Sitting, Cuff Size: Large)   Pulse 88   Temp 98.5 F (36.9 C) (Oral)   Ht 6\' 4"  (1.93 m)   Wt 225 lb 9.6 oz (102.3 kg)   SpO2 98%   BMI 27.46 kg/m  {Vitals History (Optional):23777}  Physical Exam Vitals reviewed.  Constitutional:      Appearance: Normal appearance. He is well-groomed and normal weight.  Cardiovascular:     Rate and Rhythm: Normal rate and regular rhythm.     Heart sounds: S1 normal and S2 normal. No murmur heard. Pulmonary:     Effort: Pulmonary effort is normal.     Breath sounds: Normal air entry. Wheezing (mild hight pitched, expiratory) present.  Musculoskeletal:     Right lower leg: Edema present.     Left lower leg: Edema present.  Neurological:     General: No focal deficit present.     Mental Status: He is alert and oriented to person, place, and time.     Gait: Gait is intact.  Psychiatric:        Mood and Affect: Mood and affect normal.      No results found for any visits on  08/14/23.  {Labs (Optional):23779}  The ASCVD Risk score (Arnett DK, et al., 2019) failed to calculate for the following reasons:   Cannot find a previous HDL lab   Cannot find a previous total cholesterol lab    Assessment & Plan:  Tobacco dependence -     Nicotine; Place 1 patch (21 mg total) onto the skin daily.  Dispense: 28 patch; Refill: 0 -     Nicotine; Place 1 patch (14 mg total) onto the skin daily.  Dispense: 28 patch; Refill: 0 -     Nicotine; Place 1 patch (7 mg total) onto the skin daily.  Dispense: 28 patch; Refill: 0  Pulmonary emphysema, unspecified emphysema type (HCC) -     Breztri Aerosphere; Inhale 2 puffs into the lungs 2 (  two) times daily.  Dispense: 10.7 g; Refill: 11  Vitamin D deficiency -     Vitamin D (Ergocalciferol); Take 1 capsule (50,000 Units total) by mouth every 7 (seven) days.  Dispense: 12 capsule; Refill: 1  Iron deficiency anemia secondary to inadequate dietary iron intake -     Iron (Ferrous Sulfate); Take 325 mg by mouth every other day.  Dispense: 30 tablet; Refill: 5     Return in about 3 months (around 11/12/2023) for HTN.    [name], MD

## 2023-08-14 NOTE — Patient Instructions (Addendum)
Melatonin -- 5 mg at bedtime, may increase to 20 mg max at bedtime  Continue Nifedipine 60 mg 2 tablets daily  Continue hydrochorothiazide 25 mg daily  STOP hydralazine 50 mg twice day( they sent you home with this medicine from the hospital).  Continue metoprolol 25 mg daily  Check blood pressure daily at home

## 2023-08-17 ENCOUNTER — Inpatient Hospital Stay: Admission: RE | Admit: 2023-08-17 | Payer: 59 | Source: Ambulatory Visit

## 2023-08-19 ENCOUNTER — Encounter: Payer: Self-pay | Admitting: Family Medicine

## 2023-08-19 NOTE — Assessment & Plan Note (Signed)
Pt not successful with chantix, will switch to nicotine patches, I counseled the patient on their use and that he should not smoke on the patches.

## 2023-08-20 DIAGNOSIS — D508 Other iron deficiency anemias: Secondary | ICD-10-CM | POA: Insufficient documentation

## 2023-08-20 DIAGNOSIS — E559 Vitamin D deficiency, unspecified: Secondary | ICD-10-CM | POA: Insufficient documentation

## 2023-08-20 DIAGNOSIS — J439 Emphysema, unspecified: Secondary | ICD-10-CM | POA: Insufficient documentation

## 2023-08-20 NOTE — Assessment & Plan Note (Signed)
Was discovered while in hospital, will continue the weekly vitamin D supplements for the next 6 months.

## 2023-08-20 NOTE — Assessment & Plan Note (Signed)
Pt is doing well on the breztri inhaler, wheezing is mild on exam today, will refill this for him.

## 2023-08-20 NOTE — Assessment & Plan Note (Signed)
Last iron panel was reviewed from July 2024 ,ferritin was low, will continue ferrous sulfate every other day for replacement.

## 2023-08-20 NOTE — Assessment & Plan Note (Signed)
Pt's hydrochlorothiazide was stopped in the hospital, I advised that the patient restart this medication and he may stop the furosemide once he is on the hydrochlorothiazide consistently. Pt seems a little confused about his medications and which ones he is supposed to be taking. I advised him to bring his pill bottles to the next visit for my review. Pt was also started on hydralazine in the hospital which is likely what is causing his ankle swelling.   Current hypertension medications:       Sig   hydrALAZINE (APRESOLINE) 50 MG tablet (Taking) Take 50 mg by mouth in the morning and at bedtime.   hydrochlorothiazide (HYDRODIURIL) 25 MG tablet (Taking) Take 1 tablet (25 mg total) by mouth daily.   metoprolol succinate (TOPROL-XL) 25 MG 24 hr tablet (Taking) Take 25 mg by mouth daily.   NIFEdipine (PROCARDIA XL/NIFEDICAL XL) 60 MG 24 hr tablet (Taking) Take 120 mg by mouth daily.   furosemide (LASIX) 20 MG tablet Take 20 mg by mouth daily.

## 2023-08-21 ENCOUNTER — Encounter: Payer: Self-pay | Admitting: Family Medicine

## 2023-08-22 ENCOUNTER — Ambulatory Visit
Admission: RE | Admit: 2023-08-22 | Discharge: 2023-08-22 | Disposition: A | Discharge status: Home / Self Care | Payer: 59 | Source: Ambulatory Visit | Attending: Orthopaedic Surgery | Admitting: Orthopaedic Surgery

## 2023-08-22 DIAGNOSIS — M5416 Radiculopathy, lumbar region: Secondary | ICD-10-CM

## 2023-08-22 DIAGNOSIS — M4326 Fusion of spine, lumbar region: Secondary | ICD-10-CM

## 2023-08-22 DIAGNOSIS — M5126 Other intervertebral disc displacement, lumbar region: Secondary | ICD-10-CM | POA: Diagnosis not present

## 2023-08-22 DIAGNOSIS — M48061 Spinal stenosis, lumbar region without neurogenic claudication: Secondary | ICD-10-CM | POA: Diagnosis not present

## 2023-08-25 ENCOUNTER — Telehealth: Payer: Self-pay | Admitting: Family Medicine

## 2023-08-25 ENCOUNTER — Encounter: Payer: Self-pay | Admitting: Family Medicine

## 2023-08-25 ENCOUNTER — Ambulatory Visit (INDEPENDENT_AMBULATORY_CARE_PROVIDER_SITE_OTHER): Payer: 59 | Admitting: Family Medicine

## 2023-08-25 VITALS — BP 140/70 | HR 105 | Temp 98.4°F | Ht 76.0 in | Wt 230.3 lb

## 2023-08-25 DIAGNOSIS — R400 Somnolence: Secondary | ICD-10-CM

## 2023-08-25 DIAGNOSIS — F331 Major depressive disorder, recurrent, moderate: Secondary | ICD-10-CM

## 2023-08-25 DIAGNOSIS — J449 Chronic obstructive pulmonary disease, unspecified: Secondary | ICD-10-CM

## 2023-08-25 DIAGNOSIS — M7989 Other specified soft tissue disorders: Secondary | ICD-10-CM

## 2023-08-25 DIAGNOSIS — I10 Essential (primary) hypertension: Secondary | ICD-10-CM | POA: Diagnosis not present

## 2023-08-25 MED ORDER — ARIPIPRAZOLE 10 MG PO TABS: 10.0000 mg | ORAL_TABLET | Freq: Every day | ORAL | 1 refills | Status: DC

## 2023-08-25 MED ORDER — METHYLPREDNISOLONE 4 MG PO TBPK: ORAL_TABLET | ORAL | 0 refills | Status: DC

## 2023-08-25 MED ORDER — FUROSEMIDE 40 MG PO TABS: 40.0000 mg | ORAL_TABLET | Freq: Every day | ORAL | 0 refills | Status: DC

## 2023-08-25 NOTE — Telephone Encounter (Signed)
Patient states he needs to make an appointment to see Marylene Land.

## 2023-08-25 NOTE — Assessment & Plan Note (Signed)
Oxygen levels are good and his legs actually look less swollen since the last visit.

## 2023-08-25 NOTE — Progress Notes (Signed)
Established Patient Office Visit  Subjective   Patient ID: [name] [name], male    DOB: Sep 14, 1950  Age: 72 y.o. MRN: [id number]  Chief Complaint  Patient presents with   Shortness of Breath    X3-4 months, worse 4 days ago, states EMS was called, advised transport to the hospital and patient declined   Leg Swelling    X3-4 months    Pt is here for increasing SOB and leg swelling that has persisted since the last visit. EMS was called and they had recommended he go to the ER and he had declined. He reports that the SOB is happening after he is having to urinate. States he hs getting a strong urge to urinate and then after he urinates he is getting very SOB. Reports that his legs are still very swollen. He does report that he stopped the hydralazine as was instructed, however there is significant confusion over his medications. I advised that he bring in all of his pill bottles and we can go over all of them in detail.     Current Outpatient Medications  Medication Instructions   albuterol (PROVENTIL HFA;VENTOLIN HFA) 108 (90 Base) MCG/ACT inhaler 2 puffs, Inhalation, Every 6 hours PRN   albuterol (PROVENTIL) 2.5 mg, Every 6 hours PRN   alfuzosin (UROXATRAL) 10 mg, Oral, Daily with breakfast   ARIPiprazole (ABILIFY) 10 mg, Oral, Daily   atorvastatin (LIPITOR) 20 mg, Daily   Budeson-Glycopyrrol-Formoterol (BREZTRI AEROSPHERE) 160-9-4.8 MCG/ACT AERO 2 puffs, Inhalation, 2 times daily   cyclobenzaprine (FLEXERIL) 10 mg, Oral, 3 times daily PRN   DULoxetine (CYMBALTA) 20 mg, Oral, Daily   DULoxetine (CYMBALTA) 60 mg, Oral, Daily   furosemide (LASIX) 40 MG tablet TAKE 1 TABLET(40 MG) BY MOUTH DAILY   hydrALAZINE (APRESOLINE) 50 mg, 2 times daily   hydrochlorothiazide (HYDRODIURIL) 25 mg, Oral, Daily   Iron (Ferrous Sulfate) 325 mg, Oral, Every other day   methylPREDNISolone (MEDROL DOSEPAK) 4 MG TBPK tablet Take package as directed   metoprolol succinate (TOPROL-XL) 25 mg, Daily    montelukast (SINGULAIR) 10 mg, Daily   nicotine (NICODERM CQ - DOSED IN MG/24 HOURS) 21 mg, Transdermal, Daily   nicotine (NICODERM CQ - DOSED IN MG/24 HOURS) 14 mg, Transdermal, Daily   nicotine (NICODERM CQ - DOSED IN MG/24 HR) 7 mg, Transdermal, Daily   NIFEdipine (PROCARDIA XL/NIFEDICAL XL) 120 mg, Daily   Oxycodone HCl 10 mg, Every 8 hours PRN   oxyCODONE-acetaminophen (PERCOCET) 10-325 MG tablet 1 tablet, Every 6 hours PRN   rivaroxaban (XARELTO) 20 mg, Oral, Daily with supper, Please follow up with Primary Care MD for future refills   Vitamin D (Ergocalciferol) (DRISDOL) 50,000 Units, Oral, Every 7 days    Patient Active Problem List   Diagnosis Date Noted   Pulmonary emphysema (HCC) 08/20/2023   Vitamin D deficiency 08/20/2023   Iron deficiency anemia secondary to inadequate dietary iron intake 08/20/2023   Right hip pain 05/15/2023   Tobacco dependence 05/15/2023   Lumbar radiculopathy 03/11/2023   Spinal stenosis of lumbar region 02/28/2023   HTN (hypertension), benign 02/28/2023   History of DVT (deep vein thrombosis) 02/28/2023   Hyperlipidemia 02/28/2023   Depression with anxiety 02/28/2023   Microcytic anemia 02/28/2023   Renal mass; right 01/08/2023   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   BPH with obstruction/lower urinary tract symptoms 01/08/2023   Chronic prostatitis 01/08/2023   Lesion of right native kidney 01/03/2023   Tick bite of right thigh 01/03/2023   Moderate episode of  recurrent major depressive disorder (HCC) 01/03/2023   Right flank pain 05/01/2021   Chronic right-sided back pain 05/01/2021   Chronic diarrhea 05/01/2021   Primary osteoarthritis of left knee 03/29/2020   Primary osteoarthritis of right knee 07/08/2017   Complete rotator cuff tear of left shoulder 10/21/2016   Gout attack 04/02/2016   Angina pectoris (HCC) 08/01/2014   Hemorrhage of rectum and anus 03/28/2014   Neuropathy, peripheral 05/15/2012   Pulmonary embolism (HCC) 07/11/2011    DVT (deep venous thrombosis) (HCC) 07/11/2011   Abdominal pain, left lower quadrant 01/29/2011   Long term current use of anticoagulant therapy 01/29/2011   Diarrhea 01/29/2011      Review of Systems  All other systems reviewed and are negative.     Objective:     BP (!) 140/70   Pulse (!) 105   Temp 98.4 F (36.9 C) (Oral)   Ht 6\' 4"  (1.93 m)   Wt 230 lb 4.8 oz (104.5 kg)   SpO2 95%   BMI 28.03 kg/m    Physical Exam Vitals reviewed.  Constitutional:      Appearance: Normal appearance. He is well-groomed and normal weight.  Cardiovascular:     Rate and Rhythm: Normal rate and regular rhythm.     Heart sounds: S1 normal and S2 normal. No murmur heard. Pulmonary:     Effort: Pulmonary effort is normal.     Breath sounds: Normal air entry. Wheezing (mild high pitched, expiratory) present.  Musculoskeletal:     Right lower leg: Edema present.     Left lower leg: No edema.  Neurological:     General: No focal deficit present.     Mental Status: He is alert and oriented to person, place, and time.     Gait: Gait is intact.  Psychiatric:        Mood and Affect: Mood and affect normal.      No results found for any visits on 08/25/23.    The ASCVD Risk score (Arnett DK, et al., 2019) failed to calculate for the following reasons:   Cannot find a previous HDL lab   Cannot find a previous total cholesterol lab    Assessment & Plan:  HTN (hypertension), benign Assessment & Plan: Oxygen levels are good and his legs actually look less swollen since the last visit.   Orders: -     Amb Referral to Clinical Pharmacist -     Ambulatory referral to Cardiology  Moderate episode of recurrent major depressive disorder (HCC) -     ARIPiprazole; Take 1 tablet (10 mg total) by mouth daily.  Dispense: 90 tablet; Refill: 1  Symptom of leg swelling -     Ambulatory referral to Cardiology  COPD mixed type (HCC) -     methylPREDNISolone; Take package as directed   Dispense: 21 each; Refill: 0  Daytime somnolence -     Ambulatory referral to Sleep Studies   His swelling actually looks better today on exam than the last visit. I recommended another 7 days of furosemide treatment to help reduce even further. I am concerned that the patient is not taking his medication correctly-- he cannot give me an accurate list and this has been an issue for a while. Wife reports that he has drawers full of empty medication bottles at home. I advised referral to pharmacy to go over his medications is detail and then I will discuss his medication management with the pharmacist.  Return in about 2 months (around  10/26/2023) for please reschedule his march appointment to february.    [name], MD

## 2023-08-26 ENCOUNTER — Ambulatory Visit: Payer: 59 | Admitting: Gastroenterology

## 2023-08-26 DIAGNOSIS — M5416 Radiculopathy, lumbar region: Secondary | ICD-10-CM | POA: Diagnosis not present

## 2023-08-26 NOTE — Progress Notes (Deleted)
[facility] Consult Note:  History: [name] [name] 08/26/2023  Referring provider: [name], MD  Reason for consult/chief complaint: No chief complaint on file.   Subjective  Prior history:  Last clinic visit with APP March 2024.  Chronic abdominal pain with alternating constipation and loose stool.  Suspected possibly to be some medication side effects, possibly overflow incontinence.  Some weight loss over the previous year of unclear relation to the GI symptoms.  History of DVT/PE with recurrent PE while briefly off Xarelto for back surgery in 2023 After most recent visit here, CTAP planned, but does not seem to have been done.  Did have a CT urogram June 2024 with bilateral renal cysts. [name] [name], ED July 2024 for COPD exacerbation.  Hospitalization November 2024 in [place] for COPD exacerbation. Seen by primary care yesterday for months of progressive dyspnea and peripheral edema.  He has had accelerated hypertension as well.  Furosemide started and referred to cardiology.  No-show for 08/26/2023 clinic visit  Discussed the use of AI scribe software for clinical note transcription with the patient, who gave verbal consent to proceed.  History of Present Illness            ***   ROS:  Review of Systems   Past Medical History: Past Medical History:  Diagnosis Date   Anxiety and depression    Back pain    Diverticulitis    Diverticulosis    DVT (deep venous thrombosis) (HCC)    Gout    Hernia    History of blood clots    Hypertension    Lesion of right native kidney 01/03/2023   Nausea and vomiting    Neuropathy, peripheral 05/15/2012   patient denies   Shingles      Past Surgical History: Past Surgical History:  Procedure Laterality Date   APPENDECTOMY     BACK SURGERY     CHOLECYSTECTOMY  09/09/2006   HERNIA REPAIR     HIATAL HERNIA REPAIR     IR ANGIO INTRA EXTRACRAN SEL INTERNAL CAROTID BILAT MOD SED   01/18/2020   IR ANGIO VERTEBRAL SEL VERTEBRAL BILAT MOD SED  01/18/2020   IR US GUIDE VASC ACCESS RIGHT  01/18/2020   LEFT HEART CATHETERIZATION WITH CORONARY ANGIOGRAM N/A 08/02/2014   Procedure: LEFT HEART CATHETERIZATION WITH CORONARY ANGIOGRAM;  Surgeon: [name] Pert, MD;  Location: [facility];  Service: Cardiovascular;  Laterality: N/A;   MULTIPLE EXTRACTIONS WITH ALVEOLOPLASTY N/A 10/19/2015   Procedure: Extraction of tooth #'s 2,12,13 with alveoloplasty;  Surgeon: Charlynne Pander, DDS;  Location: [facility] OR;  Service: Oral Surgery;  Laterality: N/A;   SACRAL NERVE STIMULATOR PLACEMENT  09/09/2009   [employer] [phone] MRI info-[email or web address]     Family History: Family History  Problem Relation Age of Onset   Prostate cancer Father    Heart disease Father    Diabetes Mother    Heart disease Mother    Colon cancer Maternal Uncle        dx in his 63's   Pancreatic cancer Paternal Uncle    Prostate cancer Paternal Uncle    Multiple sclerosis Daughter    Prostate cancer Paternal Uncle    Esophageal cancer Neg Hx    Rectal cancer Neg Hx    Stomach cancer Neg Hx     Social History: Social History   Socioeconomic History   Marital status: Significant Other    Spouse name: Agricultural engineer   Number of  children: 3   Years of education: 12th   Highest education level: 12th grade  Occupational History   Occupation: unemployed    Associate Professor: OTHER    Employer: RETIRED  Tobacco Use   Smoking status: Every Day    Current packs/day: 0.50    Average packs/day: 0.5 packs/day for 40.0 years (20.0 ttl pk-yrs)    Types: Cigarettes   Smokeless tobacco: Never  Vaping Use   Vaping status: Never Used  Substance and Sexual Activity   Alcohol use: Yes    Alcohol/week: 0.0 standard drinks of alcohol    Comment: Occasional   Drug use: Yes    Types: Marijuana    Comment: for nausea   Sexual activity: Not on  file  Other Topics Concern   Not on file  Social History Narrative   Patient lives at home with his family.   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    Caffeine use: 1-2 cups daily   Social Drivers of Health   Financial Resource Strain: Low Risk  (02/26/2023)   Overall Financial Resource Strain (CARDIA)    Difficulty of Paying Living Expenses: Not very hard  Food Insecurity: Low Risk  (03/18/2023)   Received from [facility]   Hunger Vital Sign    Worried About Running Out of Food in the Last Year: Never true    Ran Out of Food in the Last Year: Never true  Recent Concern: Food Insecurity - Food Insecurity Present (02/28/2023)   Hunger Vital Sign    Worried About Running Out of Food in the Last Year: Sometimes true    Ran Out of Food in the Last Year: Sometimes true  Transportation Needs: Not on file (03/18/2023)  Physical Activity: Not on file  Stress: Not on file  Social Connections: Moderately Isolated (02/26/2023)   Social Connection and Isolation Panel [NHANES]    Frequency of Communication with Friends and Family: More than three times a week    Frequency of Social Gatherings with Friends and Family: More than three times a week    Attends Religious Services: Never    Database administrator or Organizations: No    Attends Engineer, structural: Not on file    Marital Status: Living with partner    Allergies: Allergies  Allergen Reactions   Nsaids     Other Reaction(s): GI Intolerance, Other (See Comments)   Aspirin Other (See Comments)    ulcers   Celecoxib Other (See Comments)    Stomach irritation   Ibuprofen Other (See Comments)    ulcers   Lyrica [Pregabalin] Swelling   Vicodin [Hydrocodone-Acetaminophen]     Makes me mean     Outpatient Meds: Current Outpatient Medications  Medication Sig Dispense Refill   albuterol (PROVENTIL HFA;VENTOLIN HFA) 108 (90 Base) MCG/ACT inhaler Inhale 2 puffs into the lungs every 6 (six) hours  as needed for wheezing or shortness of breath. 1 Inhaler 2   albuterol (PROVENTIL) (2.5 MG/3ML) 0.083% nebulizer solution Take 2.5 mg by nebulization every 6 (six) hours as needed for wheezing or shortness of breath.     alfuzosin (UROXATRAL) 10 MG 24 hr tablet Take 1 tablet (10 mg total) by mouth daily with breakfast. 30 tablet 11   ARIPiprazole (ABILIFY) 10 MG tablet Take 1 tablet (10 mg total) by mouth daily. 90 tablet 1   atorvastatin (LIPITOR) 20 MG tablet Take 20 mg by mouth daily.     Budeson-Glycopyrrol-Formoterol (BREZTRI AEROSPHERE) 160-9-4.8  MCG/ACT AERO Inhale 2 puffs into the lungs 2 (two) times daily. 10.7 g 11   cyclobenzaprine (FLEXERIL) 10 MG tablet Take 1 tablet (10 mg total) by mouth 3 (three) times daily as needed for muscle spasms. 60 tablet 0   DULoxetine (CYMBALTA) 20 MG capsule Take 1 capsule (20 mg total) by mouth daily. 90 capsule 3   DULoxetine (CYMBALTA) 60 MG capsule Take 1 capsule (60 mg total) by mouth daily. 90 capsule 1   furosemide (LASIX) 40 MG tablet Take 1 tablet (40 mg total) by mouth daily for 7 days. 7 tablet 0   hydrALAZINE (APRESOLINE) 50 MG tablet Take 50 mg by mouth in the morning and at bedtime.     hydrochlorothiazide (HYDRODIURIL) 25 MG tablet Take 1 tablet (25 mg total) by mouth daily. 90 tablet 1   Iron, Ferrous Sulfate, 325 (65 Fe) MG TABS Take 325 mg by mouth every other day. 30 tablet 5   methylPREDNISolone (MEDROL DOSEPAK) 4 MG TBPK tablet Take package as directed 21 each 0   metoprolol succinate (TOPROL-XL) 25 MG 24 hr tablet Take 25 mg by mouth daily.     montelukast (SINGULAIR) 10 MG tablet Take 10 mg by mouth daily.     nicotine (NICODERM CQ - DOSED IN MG/24 HOURS) 14 mg/24hr patch Place 1 patch (14 mg total) onto the skin daily. 28 patch 0   nicotine (NICODERM CQ - DOSED IN MG/24 HOURS) 21 mg/24hr patch Place 1 patch (21 mg total) onto the skin daily. 28 patch 0   nicotine (NICODERM CQ - DOSED IN MG/24 HR) 7 mg/24hr patch Place 1 patch (7 mg  total) onto the skin daily. 28 patch 0   NIFEdipine (PROCARDIA XL/NIFEDICAL XL) 60 MG 24 hr tablet Take 120 mg by mouth daily.     Oxycodone HCl 10 MG TABS Take 10 mg by mouth every 8 (eight) hours as needed.     oxyCODONE-acetaminophen (PERCOCET) 10-325 MG tablet Take 1 tablet by mouth every 6 (six) hours as needed for pain.     rivaroxaban (XARELTO) 20 MG TABS tablet Take 1 tablet (20 mg total) by mouth daily with supper. Please follow up with Primary Care MD for future refills 30 tablet 0   Vitamin D, Ergocalciferol, (DRISDOL) 1.25 MG (50000 UNIT) CAPS capsule Take 1 capsule (50,000 Units total) by mouth every 7 (seven) days. 12 capsule 1   No current facility-administered medications for this visit.      ___________________________________________________________________ Objective   Exam:  There were no vitals taken for this visit. Wt Readings from Last 3 Encounters:  08/25/23 230 lb 4.8 oz (104.5 kg)  08/14/23 225 lb 9.6 oz (102.3 kg)  07/07/23 205 lb (93 kg)    General: ***  Eyes: sclera anicteric, no redness ENT: oral mucosa moist without lesions, no cervical or supraclavicular lymphadenopathy CV: ***, no JVD, no peripheral edema Resp: clear to auscultation bilaterally, normal RR and effort noted GI: soft, *** tenderness, with active bowel sounds. No guarding or palpable organomegaly noted. Skin; warm and dry, no rash or jaundice noted Neuro: awake, alert and oriented x 3. Normal gross motor function and fluent speech   Labs:     Latest Ref Rng & Units 04/05/2023    5:39 AM 02/28/2023    3:57 AM 02/27/2023    5:45 PM  CBC  WBC 4.0 - 10.5 K/uL 6.6  4.5  3.2   Hemoglobin 13.0 - 17.0 g/dL 9.7  44.0  34.7   Hematocrit  39.0 - 52.0 % 31.9  39.5  40.5   Platelets 150 - 400 K/uL 289  249  240       Latest Ref Rng & Units 04/05/2023    5:39 AM 02/28/2023    3:57 AM 02/27/2023    5:45 PM  CMP  Glucose 70 - 99 mg/dL 914  782  92   BUN 8 - 23 mg/dL 9  12  10    Creatinine  0.61 - 1.24 mg/dL 9.56  2.13  0.86   Sodium 135 - 145 mmol/L 143  136  137   Potassium 3.5 - 5.1 mmol/L 3.5  3.9  3.7   Chloride 98 - 111 mmol/L 108  101  100   CO2 22 - 32 mmol/L 25  28  29    Calcium 8.9 - 10.3 mg/dL 8.7  9.0  9.3      Radiologic Studies:  ***   No diagnosis found.  Assessment and Plan              Plan:  ***  Thank you for the courtesy of this consult.  Please call me with any questions or concerns.  Charlie Pitter III  CC: Referring provider noted above

## 2023-08-31 ENCOUNTER — Other Ambulatory Visit: Payer: Self-pay | Admitting: Family Medicine

## 2023-09-01 ENCOUNTER — Other Ambulatory Visit: Payer: Self-pay | Admitting: Family Medicine

## 2023-09-04 ENCOUNTER — Other Ambulatory Visit: Payer: Self-pay | Admitting: Family Medicine

## 2023-09-04 DIAGNOSIS — F331 Major depressive disorder, recurrent, moderate: Secondary | ICD-10-CM

## 2023-09-05 ENCOUNTER — Ambulatory Visit: Payer: 59

## 2023-09-05 ENCOUNTER — Telehealth: Payer: Self-pay | Admitting: Family Medicine

## 2023-09-05 NOTE — Telephone Encounter (Signed)
Copied from CRM [id number]. Topic: Appointments - Appointment Cancel/Reschedule >> Sep 05, 2023  9:45 AM Isabell A wrote: Patient/patient representative is calling to cancel and reschedule an appointment. Refer to attachments for appointment information.   Please call patient to reschedule pharmacy visit, patient unable to come in due to a swollen knee.

## 2023-09-05 NOTE — Telephone Encounter (Signed)
Contacted patient and r/s for 09/15/23 at 3pm in office  Sherrill Raring, PharmD Clinical Pharmacist [phone]

## 2023-09-15 ENCOUNTER — Ambulatory Visit: Payer: 59

## 2023-09-17 ENCOUNTER — Ambulatory Visit (INDEPENDENT_AMBULATORY_CARE_PROVIDER_SITE_OTHER): Payer: 59

## 2023-09-17 DIAGNOSIS — I10 Essential (primary) hypertension: Secondary | ICD-10-CM

## 2023-09-17 NOTE — Progress Notes (Signed)
 09/17/2023 Name: [name] [name] MRN: [id number] DOB: 07/09/1951  Chief Complaint  Patient presents with   Medication Management    [name] [name] is a 73 y.o. year old male who was referred for medication management by their primary care provider, Ozell Heron HERO, MD. They presented for a face to face visit today.   They were referred to the pharmacist by their PCP for assistance in managing complex medication management    Subjective:  Care Team: Primary Care Provider: Ozell Heron HERO, MD ; Next Scheduled Visit: 10/13/23  Medication Access/Adherence  Current Pharmacy:  [id number] [address] [place],  - [address] AT [facility] & [address] [place] [address] Phone: [phone] Fax: [phone]   Patient reports affordability concerns with their medications: No  Patient reports access/transportation concerns to their pharmacy: No  Patient reports adherence concerns with their medications:  Yes  -confused, unsure what he is taking, has NUMEROUS expired bottles at appt and various duplicate therapy medications (pantoprazole  vs omeprazole ; alfuzosin  vs flomax ) and has a bottle containing an assortment of tablets that he thought were vitamins (contained lipitor, bactrim , and methocarbamol )   Hypertension:  Current medications: Lasix  40mg  1 every day, Hydralazine  50mg  BID (DOES NOT KNOW IF HE IS TAKING), hydrochlorothiazide  25mg  every day (NOT TAKING), Metoprolol  XL 25mg  1 every day (NOT TAKING), Nifedipine  60mg  XL 2 qd Medications previously tried: Lisinopril, Valsartan , Benazepril   Patient has a validated, automated, upper arm home BP cuff Current blood pressure readings readings: not been using  BP in office today: 152/85 HR 90   Hyperlipidemia/ASCVD Risk Reduction  Current lipid lowering medications: Atorvastatin  20mg  (not taking) Medications tried in the past: None   Hx of DVT:  Current medications:  Xarelto  20mg  daily    Does have swelling in leg that was present at last PCP visit and patient reports was also present at last hospital admission in [place] on 07/18/23. No DVT identified at that time. Reports swelling has worsened since last PCP visit. No redness or warmth. Also complains of SOB upon standing.     Medication Management:  Current adherence strategy: None  Patient reports Poor adherence to medications  Patient reports the following barriers to adherence: too many medications      Objective:  Lab Results  Component Value Date   HGBA1C 5.5 03/11/2023    Lab Results  Component Value Date   CREATININE 0.77 04/05/2023   BUN 9 04/05/2023   NA 143 04/05/2023   K 3.5 04/05/2023   CL 108 04/05/2023   CO2 25 04/05/2023    No results found for: CHOL, HDL, LDLCALC, LDLDIRECT, TRIG, CHOLHDL  Medications Reviewed Today     Reviewed by Lionell Jon DEL, RPH (Pharmacist) on 09/17/23 at 1951  Med List Status: <None>   Medication Order Taking? Sig Documenting Provider Last Dose Status Informant  albuterol  (PROVENTIL  HFA;VENTOLIN  HFA) 108 (90 Base) MCG/ACT inhaler [id number] Yes Inhale 2 puffs into the lungs every 6 (six) hours as needed for wheezing or shortness of breath. Charlyn Sora, MD Taking Active Self  albuterol  (PROVENTIL ) (2.5 MG/3ML) 0.083% nebulizer solution [id number] No Take 2.5 mg by nebulization every 6 (six) hours as needed for wheezing or shortness of breath. [provider] Unknown Active Self  alfuzosin  (UROXATRAL ) 10 MG 24 hr tablet [id number] Yes Take 1 tablet (10 mg total) by mouth daily with breakfast. Stoneking, Adine PARAS., MD Taking Active   ARIPiprazole  (ABILIFY )  10 MG tablet [id number] Yes Take 1 tablet (10 mg total) by mouth daily. Ozell Heron HERO, MD Taking Active   atorvastatin  (LIPITOR) 20 MG tablet [id number]  Take 20 mg by mouth daily. [provider]  Active            Med Note JANEAN, JON DEL   Wed Sep 17, 2023  7:49 PM) Not taking, restarting today  Budeson-Glycopyrrol-Formoterol (BREZTRI  AEROSPHERE) 160-9-4.8 MCG/ACT AERO [id number] Yes Inhale 2 puffs into the lungs 2 (two) times daily. Ozell Heron HERO, MD Taking Active   cyclobenzaprine  (FLEXERIL ) 10 MG tablet [id number] Yes Take 1 tablet (10 mg total) by mouth 3 (three) times daily as needed for muscle spasms. Ozell Heron HERO, MD Taking Active   DULoxetine  (CYMBALTA ) 20 MG capsule [id number] Yes Take 1 capsule (20 mg total) by mouth daily. Ozell Heron HERO, MD Taking Active   DULoxetine  (CYMBALTA ) 60 MG capsule [id number] Yes Take 1 capsule (60 mg total) by mouth daily. Ozell Heron HERO, MD Taking Active   furosemide  (LASIX ) 40 MG tablet [id number] Yes TAKE 1 TABLET(40 MG) BY MOUTH DAILY Ozell Heron HERO, MD Taking Active   hydrALAZINE  (APRESOLINE ) 50 MG tablet [id number] No Take 50 mg by mouth in the morning and at bedtime. [provider] Unknown Active            Med Note JANEAN, JON DEL   Wed Sep 17, 2023  7:50 PM) Pt not sure, will resume 09/18/23  hydrochlorothiazide  (HYDRODIURIL ) 25 MG tablet [id number]  Take 1 tablet (25 mg total) by mouth daily. Ozell Heron HERO, MD  Active            Med Note JANEAN, Arshad Oberholzer H   Wed Sep 17, 2023  7:50 PM) Not taking, never resumed post-hospital  Iron , Ferrous Sulfate , 325 (65 Fe) MG TABS [id number] No Take 325 mg by mouth every other day. Ozell Heron HERO, MD Unknown Active   metoprolol  succinate (TOPROL -XL) 25 MG 24 hr tablet [id number]  Take 25 mg by mouth daily. [provider]  Active Self           Med Note JANEAN, JON DEL   Wed Sep 17, 2023  7:51 PM) Not taking, will resume 09/18/23  montelukast (SINGULAIR) 10 MG tablet [id number] Yes Take 10 mg by mouth daily. [provider] Taking Active   nicotine  (NICODERM CQ  - DOSED IN MG/24 HOURS) 14 mg/24hr patch [id number]  Place 1 patch (14 mg total) onto the skin daily. Ozell Heron HERO, MD  Active   nicotine   (NICODERM CQ  - DOSED IN MG/24 HOURS) 21 mg/24hr patch [id number]  Place 1 patch (21 mg total) onto the skin daily. Ozell Heron HERO, MD  Active   nicotine  (NICODERM CQ  - DOSED IN MG/24 HR) 7 mg/24hr patch [id number]  Place 1 patch (7 mg total) onto the skin daily. Ozell Heron HERO, MD  Active   NIFEdipine  (PROCARDIA  XL/NIFEDICAL XL) 60 MG 24 hr tablet [id number] Yes Take 120 mg by mouth daily. [provider] Taking Active   Oxycodone  HCl 10 MG TABS [id number]  Take 10 mg by mouth every 8 (eight) hours as needed. [provider]  Active            Med Note JANEAN, JON DEL   Wed Sep 17, 2023  7:51 PM) Oxycodone  15mg   rivaroxaban  (XARELTO ) 20 MG TABS tablet [id number] Yes Take 1 tablet (20 mg total) by mouth daily with supper. Please follow up with Primary Care MD  for future refills Onesimo Emaline Brink, MD Taking Active Self  Vitamin D , Ergocalciferol , (DRISDOL ) 1.25 MG (50000 UNIT) CAPS capsule [id number] No Take 1 capsule (50,000 Units total) by mouth every 7 (seven) days. Ozell Heron HERO, MD Unknown Active               Assessment/Plan:   Hypertension: - Currently uncontrolled - Reviewed long term cardiovascular and renal outcomes of uncontrolled blood pressure - Reviewed appropriate blood pressure monitoring technique and reviewed goal blood pressure. Recommended to check home blood pressure and heart rate daily and keep a log x 1 week - Recommend to RESTART Metoprolol  (needs new rx, requesting from PCP). Continue lasix  until deciding with PCP if hydrochlorothiazide  will be resumed, RESTART Hydralazine , Continue nifedipine  (consider future change to amlodipine).     Hyperlipidemia/ASCVD Risk Reduction: - Currently uncontrolled. - need updated lipid panel - Reviewed long term complications of uncontrolled cholesterol -RESTART atorvastatin    Hx of DVT/Present Leg Swelling: - Currently uncontrolled - Reviewed importance of adherence to anticoagulant for clot  prevention. -PCP notified of leg swelling, reviewed together, unlikely DVT as he is already on Xarelto , PCP ordering further testing. Patient aware to come back to office today for bloodwork.  Medication Management: - Currently strategy insufficient to maintain appropriate adherence to prescribed medication regimen - Suggested use of weekly pill box to organize medications - Created list of medication, indication, and administration time. Provided to patient    Follow Up Plan: 1 week  Jon VEAR Lindau, PharmD Clinical Pharmacist [phone]

## 2023-09-18 ENCOUNTER — Other Ambulatory Visit: Payer: Self-pay | Admitting: Family Medicine

## 2023-09-18 ENCOUNTER — Ambulatory Visit (HOSPITAL_COMMUNITY)
Admission: RE | Admit: 2023-09-18 | Discharge: 2023-09-18 | Disposition: A | Discharge status: Home / Self Care | Payer: 59 | Source: Ambulatory Visit | Attending: Family Medicine | Admitting: Family Medicine

## 2023-09-18 ENCOUNTER — Other Ambulatory Visit (INDEPENDENT_AMBULATORY_CARE_PROVIDER_SITE_OTHER): Payer: 59

## 2023-09-18 DIAGNOSIS — I825Y1 Chronic embolism and thrombosis of unspecified deep veins of right proximal lower extremity: Secondary | ICD-10-CM

## 2023-09-18 DIAGNOSIS — R2241 Localized swelling, mass and lump, right lower limb: Secondary | ICD-10-CM | POA: Diagnosis not present

## 2023-09-18 LAB — CBC WITH DIFFERENTIAL/PLATELET
Basophils Absolute: 0 10*3/uL (ref 0.0–0.1)
Basophils Relative: 0.6 % (ref 0.0–3.0)
Eosinophils Absolute: 0.1 10*3/uL (ref 0.0–0.7)
Eosinophils Relative: 2.5 % (ref 0.0–5.0)
HCT: 36.3 % — ABNORMAL LOW (ref 39.0–52.0)
Hemoglobin: 11.6 g/dL — ABNORMAL LOW (ref 13.0–17.0)
Lymphocytes Relative: 30.2 % (ref 12.0–46.0)
Lymphs Abs: 1.6 10*3/uL (ref 0.7–4.0)
MCHC: 31.9 g/dL (ref 30.0–36.0)
MCV: 71.2 fL — ABNORMAL LOW (ref 78.0–100.0)
Monocytes Absolute: 0.5 10*3/uL (ref 0.1–1.0)
Monocytes Relative: 8.9 % (ref 3.0–12.0)
Neutro Abs: 3 10*3/uL (ref 1.4–7.7)
Neutrophils Relative %: 57.8 % (ref 43.0–77.0)
Platelets: 277 10*3/uL (ref 150.0–400.0)
RBC: 5.1 Mil/uL (ref 4.22–5.81)
RDW: 17.2 % — ABNORMAL HIGH (ref 11.5–15.5)
WBC: 5.3 10*3/uL (ref 4.0–10.5)

## 2023-09-18 LAB — BASIC METABOLIC PANEL
BUN: 14 mg/dL (ref 6–23)
CO2: 36 meq/L — ABNORMAL HIGH (ref 19–32)
Calcium: 9.4 mg/dL (ref 8.4–10.5)
Chloride: 101 meq/L (ref 96–112)
Creatinine, Ser: 0.97 mg/dL (ref 0.40–1.50)
GFR: 77.51 mL/min (ref >60.00)
Glucose, Bld: 94 mg/dL (ref 70–99)
Potassium: 4.6 meq/L (ref 3.5–5.1)
Sodium: 143 meq/L (ref 135–145)

## 2023-09-19 ENCOUNTER — Encounter (HOSPITAL_COMMUNITY): Payer: Self-pay

## 2023-09-19 ENCOUNTER — Ambulatory Visit (HOSPITAL_COMMUNITY): Payer: 59 | Attending: Vascular Surgery

## 2023-09-19 ENCOUNTER — Other Ambulatory Visit: Payer: Self-pay | Admitting: Family Medicine

## 2023-09-19 DIAGNOSIS — R2241 Localized swelling, mass and lump, right lower limb: Secondary | ICD-10-CM

## 2023-09-19 DIAGNOSIS — I70223 Atherosclerosis of native arteries of extremities with rest pain, bilateral legs: Secondary | ICD-10-CM

## 2023-09-19 NOTE — Addendum Note (Signed)
 Addended by: Johnella Moloney on: 09/19/2023 01:11 PM   Modules accepted: Orders

## 2023-09-22 ENCOUNTER — Ambulatory Visit (HOSPITAL_COMMUNITY): Payer: 59

## 2023-09-24 ENCOUNTER — Ambulatory Visit: Payer: 59

## 2023-09-25 ENCOUNTER — Ambulatory Visit: Payer: 59 | Admitting: Family Medicine

## 2023-09-25 ENCOUNTER — Encounter: Payer: Self-pay | Admitting: Family Medicine

## 2023-09-25 DIAGNOSIS — Z0000 Encounter for general adult medical examination without abnormal findings: Secondary | ICD-10-CM | POA: Diagnosis not present

## 2023-09-25 NOTE — Patient Instructions (Addendum)
I really enjoyed getting to talk with you today! I am available on Tuesdays and Thursdays for virtual visits if you have any questions or concerns, or if I can be of any further assistance.   CHECKLIST FROM ANNUAL WELLNESS VISIT:  -Follow up (please call to schedule if not scheduled after visit):   - follow up with your Primary Doctor in the next few weeks   -yearly for annual wellness visit with primary care office  Here is a list of your preventive care/health maintenance measures and the plan for each if any are due:  PLAN For any measures below that may be due:  -Call the Gastroenterologist to schedule your colon cancer screening. Call ASAP. [phone] -can get the vaccines at the pharmacy   Health Maintenance  Topic Date Due   Pneumonia Vaccine 59+ Years old (1 of 2 - PCV) Never done   DTaP/Tdap/Td (1 - Tdap) Never done   Zoster Vaccines- Shingrix (1 of 2) Never done   Colonoscopy  06/28/2022   COVID-19 Vaccine (1 - 2024-25 season) Never done   Hepatitis C Screening  03/10/2024 (Originally 04/25/1969)   Lung Cancer Screening  06/01/2024   Medicare Annual Wellness (AWV)  09/24/2024   INFLUENZA VACCINE  Completed   HPV VACCINES  Aged Out    -See a dentist at least yearly  -Get your eyes checked and then per your eye specialist's recommendations  -Other issues addressed today:  - please quit smoking -call 1-800- Quitnow for a quit coach -set a quit date in the next week and then get ready - get the patches, gum and a quit coach, write down a plan for incase you have cravings or relapse, get reid of all cigarettes before the quit date -get nicotine patches (start with 14 or 21 mg patches - change ever day for 4 weeks), then go to a lower patch for 2-4 weeks, then finally to the 7 mg patch for 2 weeks -use gum if you have cravings -if you have a relapse, quit again. Keep quitting until you are successful.    -I have included below further information regarding a  healthy whole foods based diet, physical activity guidelines for adults, stress management and opportunities for social connections. I hope you find this information useful.   -----------------------------------------------------------------------------------------------------------------------------------------------------------------------------------------------------------------------------------------------------------  NUTRITION: -eat real food: lots of colorful vegetables (half the plate) and fruits -5-7 servings of vegetables and fruits per day (fresh or steamed is best), exp. 2 servings of vegetables with lunch and dinner and 2 servings of fruit per day. Berries and greens such as kale and collards are great choices.  -consume on a regular basis: whole grains (make sure first ingredient on label contains the word "whole"), fresh fruits, fish, nuts, seeds, healthy oils (such as olive oil, avocado oil, grape seed oil) -may eat small amounts of dairy and lean meat on occasion, but avoid processed meats such as ham, bacon, lunch meat, etc. -drink water -try to avoid fast food and pre-packaged foods, processed meat -most experts advise limiting sodium to < 2300mg  per day, should limit further is any chronic conditions such as high blood pressure, heart disease, diabetes, etc. The American Heart Association advised that < 1500mg  is is ideal -try to avoid foods that contain any ingredients with names you do not recognize  -try to avoid sugar/sweets (except for the natural sugar that occurs in fresh fruit) -try to avoid sweet drinks -try to avoid white rice, white bread, pasta (unless  whole grain), white or yellow potatoes  EXERCISE GUIDELINES FOR ADULTS: -if you wish to increase your physical activity, do so gradually and with the approval of your doctor -STOP and seek medical care immediately if you have any chest pain, chest discomfort or trouble breathing when starting or increasing exercise   -move and stretch your body, legs, feet and arms when sitting for long periods -Physical activity guidelines for optimal health in adults: -least 150 minutes per week of aerobic exercise (can talk, but not sing) once approved by your doctor, 20-30 minutes of sustained activity or two 10 minute episodes of sustained activity every day.  -resistance training at least 2 days per week if approved by your doctor -balance exercises 3+ days per week:   Stand somewhere where you have something sturdy to hold onto if you lose balance.    1) lift up on toes, start with 5x per day and work up to 20x   2) stand and lift on leg straight out to the side so that foot is a few inches of the floor, start with 5x each side and work up to 20x each side   3) stand on one foot, start with 5 seconds each side and work up to 20 seconds on each side  If you need ideas or help with getting more active:  -Silver sneakers https://tools.silversneakers.com  -Walk with a Doc: http://www.duncan-williams.[email or web address]/  -try to include resistance (weight lifting/strength building) and balance exercises twice per week: or the following link for ideas: http://castillo-powell.com/  BuyDucts.dk  STRESS MANAGEMENT: -can try meditating, or just sitting quietly with deep breathing while intentionally relaxing all parts of your body for 5 minutes daily -if you need further help with stress, anxiety or depression please follow up with your primary doctor or contact the wonderful folks at [facility]: [phone]  SOCIAL CONNECTIONS: -options in [place] if you wish to engage in more social and exercise related activities:  -Silver sneakers https://tools.silversneakers.com  -Walk with a Doc: http://www.duncan-[email or web address]/  -Check out the [facility] Active Adults 50+ section on the [place] [employer] (hiking clubs, book clubs,  cards and games, chess, exercise classes, aquatic classes and much more) - see the website for details: https://www.[place].gov/departments/parks-recreation/active-adults50  -YouTube has lots of exercise videos for different ages and abilities as well  -[facility] (a variety of indoor and outdoor inperson activities for adults). [phone]. [address]  -Virtual Online Classes (a variety of topics): see seniorplanet.org or call [phone]  -consider volunteering at a school, hospice center, church, senior center or elsewhere

## 2023-09-25 NOTE — Progress Notes (Signed)
 Patient unable to obtain vital signs due to telehealth visit

## 2023-09-25 NOTE — Progress Notes (Signed)
PATIENT CHECK-IN and HEALTH RISK ASSESSMENT QUESTIONNAIRE:  -completed by phone/video for upcoming Medicare Preventive Visit  -Please select "NOT IN PERSON" for method of visit.   Pre-Visit Check-in: 1)Vitals (height, wt, BP, etc) - record in vitals section for visit on day of visit Request home vitals (wt, BP, etc.) and enter into vitals, THEN update Vital Signs SmartPhrase below at the top of the HPI. See below.  2)Review and Update Medications, Allergies PMH, Surgeries, Social history in Epic 3)Hospitalizations in the last year with date/reason? Yes,Gait issues   4)Review and Update Care Team (patient's specialists) in Epic 5) Complete PHQ9 in Epic  6) Complete Fall Screening in Epic 7)Review all Health Maintenance Due and order under PCP if not done.  Medicare Wellness Patient Questionnaire:  Answer theses question about your habits: How often do you have a drink containing alcohol?No Have you ever smoked?Yes - still smokes How many packs a day do/did you smoke? About 1/2 - 1 pack per day, he is interested in quitting, he tried the patch before but was not using it correctly Do you use smokeless tobacco? No Do you use an illicit drugs? No On average, how many days per week do you engage in moderate to strenuous exercise (like a brisk walk)? None Typical breakfast: Varies  Typical lunch: Varies  Typical dinner: Varies Typical snacks: Fruit   Beverages: Pepsi  Answer theses question about your everyday activities: Can you perform most household chores? Sometimes  Are you deaf or have significant trouble hearing? No Do you feel that you have a problem with memory? Yes Do you feel safe at home? Yes  Last dentist visit? 3 months ago  8. Do you have any difficulty performing your everyday activities? Sometimes  Are you having any difficulty walking, taking medications on your own, and or difficulty managing daily home needs? Yes  Do you have difficulty walking or climbing  stairs? Yes  Do you have difficulty dressing or bathing? No Do you have difficulty doing errands alone such as visiting a doctor's office or shopping?No Do you currently have any difficulty preparing food and eating?Sometimes  Do you currently have any difficulty using the toilet?No Do you have any difficulty managing your finances?Sometimes  Do you have any difficulties with housekeeping of managing your housekeeping? No   Do you have Advanced Directives in place (Living Will, Healthcare Power or Attorney)? Yes    Last eye Exam and location? Has appt coming up   Do you currently use prescribed or non-prescribed narcotic or opioid pain medications?Yes   Do you have a history or close family history of breast, ovarian, tubal or peritoneal cancer or a family member with BRCA (breast cancer susceptibility 1 and 2) gene mutations? No   Nurse/Assistant Credentials/time stamp: Mg 4:07    ----------------------------------------------------------------------------------------------------------------------------------------------------------------------------------------------------------------------  Because this visit was a virtual/telehealth visit, some criteria may be missing or patient reported. Any vitals not documented were not able to be obtained and vitals that have been documented are patient reported.    MEDICARE ANNUAL PREVENTIVE CARE VISIT WITH PROVIDER (Welcome to Medicare, initial annual wellness or annual wellness exam)  Virtual Visit via Phone Note  I connected with [name] [name] on 09/25/23  by phone and verified that I am speaking with the correct person using two identifiers.He preferred a phone visit and declined a video visit.   Location patient: home Location provider:work or home office Persons participating in the virtual visit: patient, provider  Concerns and/or follow up today: reports  doing ok other than knee issues and back issues - see orthopedics and a  back doctor for theses issues. Has had some wt loss, then gain and occ fevers - reports saw Dr. Barth Kirks for this and is having work with follow up and has follow up next week per his report. Denies chronic cough, CP, SOB.   See HM section in Epic for other details of completed HM.    ROS: unintentional weight loss, vision changes, vision loss, hearing loss or change, chest pain, sob, hemoptysis, melena, hematochezia, hematuria, falls, bleeding or bruising, thoughts of suicide or self harm, memory loss  Patient-completed extensive health risk assessment - reviewed and discussed with the patient: See Health Risk Assessment completed with patient prior to the visit either above or in recent phone note. This was reviewed in detailed with the patient today and appropriate recommendations, orders and referrals were placed as needed per Summary below and patient instructions.   Review of Medical History: -PMH, PSH, Family History and current specialty and care providers reviewed and updated and listed below   Patient Care Team: [name], MD as PCP - General (Family Medicine) Sherrill Raring, [facility] (Pharmacist)   Past Medical History:  Diagnosis Date   Anxiety and depression    Back pain    Diverticulitis    Diverticulosis    DVT (deep venous thrombosis) (HCC)    Gout    Hernia    History of blood clots    Hypertension    Lesion of right native kidney 01/03/2023   Nausea and vomiting    Neuropathy, peripheral 05/15/2012   patient denies   Shingles     Past Surgical History:  Procedure Laterality Date   APPENDECTOMY     BACK SURGERY     CHOLECYSTECTOMY  09/09/2006   HERNIA REPAIR     HIATAL HERNIA REPAIR     IR ANGIO INTRA EXTRACRAN SEL INTERNAL CAROTID BILAT MOD SED  01/18/2020   IR ANGIO VERTEBRAL SEL VERTEBRAL BILAT MOD SED  01/18/2020   IR US GUIDE VASC ACCESS RIGHT  01/18/2020   LEFT HEART CATHETERIZATION WITH CORONARY ANGIOGRAM N/A 08/02/2014   Procedure: LEFT  HEART CATHETERIZATION WITH CORONARY ANGIOGRAM;  Surgeon: Pamella Pert, MD;  Location: [facility] CATH LAB;  Service: Cardiovascular;  Laterality: N/A;   MULTIPLE EXTRACTIONS WITH ALVEOLOPLASTY N/A 10/19/2015   Procedure: Extraction of tooth #'s 2,12,13 with alveoloplasty;  Surgeon: Charlynne Pander, DDS;  Location: [facility] OR;  Service: Oral Surgery;  Laterality: N/A;   SACRAL NERVE STIMULATOR PLACEMENT  09/09/2009   Medtronic RestoreADVANCED [phone] MRI info-[email or web address]    Social History   Socioeconomic History   Marital status: Significant Other    Spouse name: Agricultural engineer   Number of children: 3   Years of education: 12th   Highest education level: 11th grade  Occupational History   Occupation: unemployed    Associate Professor: OTHER    Employer: RETIRED  Tobacco Use   Smoking status: Every Day    Current packs/day: 0.50    Average packs/day: 0.5 packs/day for 40.0 years (20.0 ttl pk-yrs)    Types: Cigarettes   Smokeless tobacco: Never  Vaping Use   Vaping status: Never Used  Substance and Sexual Activity   Alcohol use: Yes    Alcohol/week: 0.0 standard drinks of alcohol    Comment: Occasional   Drug use: Yes    Types: Marijuana    Comment: for nausea   Sexual activity: Not on file  Other  Topics Concern   Not on file  Social History Narrative   Patient lives at home with his family.   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    Caffeine use: 1-2 cups daily   Social Drivers of Health   Financial Resource Strain: Medium Risk (09/25/2023)   Overall Financial Resource Strain (CARDIA)    Difficulty of Paying Living Expenses: Somewhat hard  Food Insecurity: No Food Insecurity (09/25/2023)   Hunger Vital Sign    Worried About Running Out of Food in the Last Year: Never true    Ran Out of Food in the Last Year: Never true  Recent Concern: Food Insecurity - Food Insecurity Present (09/04/2023)    Hunger Vital Sign    Worried About Running Out of Food in the Last Year: Never true    Ran Out of Food in the Last Year: Sometimes true  Transportation Needs: No Transportation Needs (09/25/2023)   PRAPARE - Administrator, Civil Service (Medical): No    Lack of Transportation (Non-Medical): No  Physical Activity: Sufficiently Active (09/25/2023)   Exercise Vital Sign    Days of Exercise per Week: 7 days    Minutes of Exercise per Session: 60 min  Stress: Stress Concern Present (09/25/2023)   [employer] of Occupational Health - Occupational Stress Questionnaire    Feeling of Stress : Rather much  Social Connections: Socially Isolated (09/25/2023)   Social Connection and Isolation Panel [NHANES]    Frequency of Communication with Friends and Family: More than three times a week    Frequency of Social Gatherings with Friends and Family: Never    Attends Religious Services: Never    Database administrator or Organizations: No    Attends Banker Meetings: Never    Marital Status: Never married  Intimate Partner Violence: Not At Risk (09/25/2023)   Humiliation, Afraid, Rape, and Kick questionnaire    Fear of Current or Ex-Partner: No    Emotionally Abused: No    Physically Abused: No    Sexually Abused: No    Family History  Problem Relation Age of Onset   Prostate cancer Father    Heart disease Father    Diabetes Mother    Heart disease Mother    Colon cancer Maternal Uncle        dx in his 70's   Pancreatic cancer Paternal Uncle    Prostate cancer Paternal Uncle    Multiple sclerosis Daughter    Prostate cancer Paternal Uncle    Esophageal cancer Neg Hx    Rectal cancer Neg Hx    Stomach cancer Neg Hx     Current Outpatient Medications on File Prior to Visit  Medication Sig Dispense Refill   albuterol (PROVENTIL HFA;VENTOLIN HFA) 108 (90 Base) MCG/ACT inhaler Inhale 2 puffs into the lungs every 6 (six) hours as needed for wheezing or shortness  of breath. 1 Inhaler 2   albuterol (PROVENTIL) (2.5 MG/3ML) 0.083% nebulizer solution Take 2.5 mg by nebulization every 6 (six) hours as needed for wheezing or shortness of breath.     alfuzosin (UROXATRAL) 10 MG 24 hr tablet Take 1 tablet (10 mg total) by mouth daily with breakfast. 30 tablet 11   ARIPiprazole (ABILIFY) 10 MG tablet Take 1 tablet (10 mg total) by mouth daily. 90 tablet 1   atorvastatin (LIPITOR) 20 MG tablet Take 20 mg by mouth daily.     Budeson-Glycopyrrol-Formoterol (BREZTRI AEROSPHERE) 160-9-4.8 MCG/ACT AERO  Inhale 2 puffs into the lungs 2 (two) times daily. 10.7 g 11   cyclobenzaprine (FLEXERIL) 10 MG tablet Take 1 tablet (10 mg total) by mouth 3 (three) times daily as needed for muscle spasms. 60 tablet 0   DULoxetine (CYMBALTA) 20 MG capsule Take 1 capsule (20 mg total) by mouth daily. 90 capsule 3   DULoxetine (CYMBALTA) 60 MG capsule Take 1 capsule (60 mg total) by mouth daily. 90 capsule 1   furosemide (LASIX) 40 MG tablet TAKE 1 TABLET(40 MG) BY MOUTH DAILY 90 tablet 0   hydrALAZINE (APRESOLINE) 50 MG tablet Take 50 mg by mouth in the morning and at bedtime.     hydrochlorothiazide (HYDRODIURIL) 25 MG tablet Take 1 tablet (25 mg total) by mouth daily. 90 tablet 1   Iron, Ferrous Sulfate, 325 (65 Fe) MG TABS Take 325 mg by mouth every other day. 30 tablet 5   metoprolol succinate (TOPROL-XL) 25 MG 24 hr tablet Take 25 mg by mouth daily.     montelukast (SINGULAIR) 10 MG tablet Take 10 mg by mouth daily.     nicotine (NICODERM CQ - DOSED IN MG/24 HOURS) 14 mg/24hr patch Place 1 patch (14 mg total) onto the skin daily. 28 patch 0   nicotine (NICODERM CQ - DOSED IN MG/24 HOURS) 21 mg/24hr patch Place 1 patch (21 mg total) onto the skin daily. 28 patch 0   nicotine (NICODERM CQ - DOSED IN MG/24 HR) 7 mg/24hr patch Place 1 patch (7 mg total) onto the skin daily. 28 patch 0   NIFEdipine (PROCARDIA XL/NIFEDICAL XL) 60 MG 24 hr tablet Take 120 mg by mouth daily.     Oxycodone  HCl 10 MG TABS Take 10 mg by mouth every 8 (eight) hours as needed.     pantoprazole (PROTONIX) 40 MG tablet Take 40 mg by mouth 2 (two) times daily.     rivaroxaban (XARELTO) 20 MG TABS tablet Take 1 tablet (20 mg total) by mouth daily with supper. Please follow up with Primary Care MD for future refills 30 tablet 0   Varenicline Tartrate, Starter, (CHANTIX STARTING MONTH PAK) 0.5 MG X 11 & 1 MG X 42 TBPK Take by mouth.     Vitamin D, Ergocalciferol, (DRISDOL) 1.25 MG (50000 UNIT) CAPS capsule Take 1 capsule (50,000 Units total) by mouth every 7 (seven) days. 12 capsule 1   No current facility-administered medications on file prior to visit.    Allergies  Allergen Reactions   Nsaids     Other Reaction(s): GI Intolerance, Other (See Comments)   Aspirin Other (See Comments)    ulcers   Celecoxib Other (See Comments)    Stomach irritation   Ibuprofen Other (See Comments)    ulcers   Lyrica [Pregabalin] Swelling   Vicodin [Hydrocodone-Acetaminophen]     Makes me mean        Physical Exam Vitals requested from patient and listed below if patient had equipment and was able to obtain at home for this virtual visit: There were no vitals filed for this visit. Estimated body mass index is 28.03 kg/m as calculated from the following:   Height as of 08/25/23: 6\' 4"  (1.93 m).   Weight as of 08/25/23: 230 lb 4.8 oz (104.5 kg).  EKG (optional): deferred due to virtual visit  GENERAL: alert, oriented, no acute distress detected; full vision exam deferred due to pandemic and/or virtual encounter  PSYCH/NEURO: pleasant and cooperative, no obvious depression or anxiety, speech and thought processing grossly  intact, Cognitive function grossly intact  Flowsheet Row Office Visit from 09/25/2023 in [facility] [place]  PHQ-9 Total Score 6           09/25/2023    3:57 PM 08/14/2023   11:33 AM 05/13/2023   12:58 PM 01/02/2023    2:41 PM 06/01/2018   11:26 AM  Depression  screen PHQ 2/9  Decreased Interest 1 2 2  0 0  Down, Depressed, Hopeless 1 1 2 3 1   PHQ - 2 Score 2 3 4 3 1   Altered sleeping 3 2 2 3    Tired, decreased energy 1 2 3 3    Change in appetite 0 3 2 2    Feeling bad or failure about yourself  0 1 0 0   Trouble concentrating 0 3 2 3    Moving slowly or fidgety/restless 0 0 0 1   Suicidal thoughts 0 0 0 0   PHQ-9 Score 6 14 13 15    Difficult doing work/chores Not difficult at all  Somewhat difficult         04/26/2021    9:27 AM 01/02/2022    1:18 PM 01/02/2023    2:43 PM 02/26/2023    9:51 PM 09/25/2023    3:58 PM  Fall Risk  Falls in the past year?   1 1 1   Was there an injury with Fall?   0 1 0  Fall Risk Category Calculator   1 2 2   (RETIRED) Patient Fall Risk Level Low fall risk Low fall risk     Patient at Risk for Falls Due to   No Fall Risks  No Fall Risks  Fall risk Follow up   Falls evaluation completed  Falls evaluation completed     SUMMARY AND PLAN:  Encounter for Medicare annual wellness exam  Discussed applicable health maintenance/preventive health measures and advised and referred or ordered per patient preferences: -due for colonoscopy, advised, he says he will call to schedule, provided number to GI -discussed vaccines due, he says his wife will help him get theses taken care of, asked him to let us know when he does so that we can update record -advised to keep follow up with Primary Doctor   Health Maintenance  Topic Date Due   Pneumonia Vaccine 77+ Years old (1 of 2 - PCV) Never done   DTaP/Tdap/Td (1 - Tdap) Never done   Zoster Vaccines- Shingrix (1 of 2) Never done   Colonoscopy  06/28/2022   COVID-19 Vaccine (1 - 2024-25 season) Never done   Hepatitis C Screening  03/10/2024 (Originally 04/25/1969)   Lung Cancer Screening  06/01/2024   Medicare Annual Wellness (AWV)  09/24/2024   INFLUENZA VACCINE  Completed   HPV VACCINES  Aged [employer] and counseling on the following was provided based on  the above review of health and a plan/checklist for the patient, along with additional information discussed, was provided for the patient in the patient instructions :  -Provided safe balance exercises that can be done at home to improve balance and discussed exercise guidelines for adults with include balance exercises at least 3 days per week.  -Advised and counseled on a healthy lifestyle - including the importance of a healthy diet, regular physical activity, social connections and stress management. -Reviewed patient's current diet. Advised and counseled on a whole foods based healthy diet. A summary of a healthy diet was provided in the Patient Instructions. Goal to limit sweetened beverages.  -  reviewed patient's current physical activity level and discussed exercise guidelines for adults. Discussed community resources and ideas for safe exercise at home to assist in meeting exercise guideline recommendations in a safe and healthy way.  -Advise yearly dental visits at minimum and regular eye exams -Advised and counseled on tobacco use, risks of smoking and offered counseling/help  Follow up: see patient instructions   Patient Instructions  I really enjoyed getting to talk with you today! I am available on Tuesdays and Thursdays for virtual visits if you have any questions or concerns, or if I can be of any further assistance.   CHECKLIST FROM ANNUAL WELLNESS VISIT:  -Follow up (please call to schedule if not scheduled after visit):   - follow up with your Primary Doctor in the next few weeks   -yearly for annual wellness visit with primary care office  Here is a list of your preventive care/health maintenance measures and the plan for each if any are due:  PLAN For any measures below that may be due:  -Call the Gastroenterologist to schedule your colon cancer screening. Call ASAP. [phone] -can get the vaccines at the pharmacy   Health Maintenance  Topic Date Due    Pneumonia Vaccine 21+ Years old (1 of 2 - PCV) Never done   DTaP/Tdap/Td (1 - Tdap) Never done   Zoster Vaccines- Shingrix (1 of 2) Never done   Colonoscopy  06/28/2022   COVID-19 Vaccine (1 - 2024-25 season) Never done   Hepatitis C Screening  03/10/2024 (Originally 04/25/1969)   Lung Cancer Screening  06/01/2024   Medicare Annual Wellness (AWV)  09/24/2024   INFLUENZA VACCINE  Completed   HPV VACCINES  Aged Out    -See a dentist at least yearly  -Get your eyes checked and then per your eye specialist's recommendations  -Other issues addressed today:  - please quit smoking -call 1-800- Quitnow for a quit coach -set a quit date in the next week and then get ready - get the patches, gum and a quit coach, write down a plan for incase you have cravings or relapse, get reid of all cigarettes before the quit date -get nicotine patches (start with 14 or 21 mg patches - change ever day for 4 weeks), then go to a lower patch for 2-4 weeks, then finally to the 7 mg patch for 2 weeks -use gum if you have cravings -if you have a relapse, quit again. Keep quitting until you are successful.    -I have included below further information regarding a healthy whole foods based diet, physical activity guidelines for adults, stress management and opportunities for social connections. I hope you find this information useful.   -----------------------------------------------------------------------------------------------------------------------------------------------------------------------------------------------------------------------------------------------------------  NUTRITION: -eat real food: lots of colorful vegetables (half the plate) and fruits -5-7 servings of vegetables and fruits per day (fresh or steamed is best), exp. 2 servings of vegetables with lunch and dinner and 2 servings of fruit per day. Berries and greens such as kale and collards are great choices.  -consume on a regular  basis: whole grains (make sure first ingredient on label contains the word "whole"), fresh fruits, fish, nuts, seeds, healthy oils (such as olive oil, avocado oil, grape seed oil) -may eat small amounts of dairy and lean meat on occasion, but avoid processed meats such as ham, bacon, lunch meat, etc. -drink water -try to avoid fast food and pre-packaged foods, processed meat -most experts advise limiting sodium to < 2300mg  per day,  should limit further is any chronic conditions such as high blood pressure, heart disease, diabetes, etc. The American Heart Association advised that < 1500mg  is is ideal -try to avoid foods that contain any ingredients with names you do not recognize  -try to avoid sugar/sweets (except for the natural sugar that occurs in fresh fruit) -try to avoid sweet drinks -try to avoid white rice, white bread, pasta (unless whole grain), white or yellow potatoes  EXERCISE GUIDELINES FOR ADULTS: -if you wish to increase your physical activity, do so gradually and with the approval of your doctor -STOP and seek medical care immediately if you have any chest pain, chest discomfort or trouble breathing when starting or increasing exercise  -move and stretch your body, legs, feet and arms when sitting for long periods -Physical activity guidelines for optimal health in adults: -least 150 minutes per week of aerobic exercise (can talk, but not sing) once approved by your doctor, 20-30 minutes of sustained activity or two 10 minute episodes of sustained activity every day.  -resistance training at least 2 days per week if approved by your doctor -balance exercises 3+ days per week:   Stand somewhere where you have something sturdy to hold onto if you lose balance.    1) lift up on toes, start with 5x per day and work up to 20x   2) stand and lift on leg straight out to the side so that foot is a few inches of the floor, start with 5x each side and work up to 20x each side   3) stand  on one foot, start with 5 seconds each side and work up to 20 seconds on each side  If you need ideas or help with getting more active:  -Silver sneakers https://tools.silversneakers.com  -Walk with a Doc: http://www.duncan-williams.com/  -try to include resistance (weight lifting/strength building) and balance exercises twice per week: or the following link for ideas: http://castillo-powell.com/  BuyDucts.dk  STRESS MANAGEMENT: -can try meditating, or just sitting quietly with deep breathing while intentionally relaxing all parts of your body for 5 minutes daily -if you need further help with stress, anxiety or depression please follow up with your primary doctor or contact the wonderful folks at [facility]: [phone]  SOCIAL CONNECTIONS: -options in [place] if you wish to engage in more social and exercise related activities:  -Silver sneakers https://tools.silversneakers.com  -Walk with a Doc: http://www.duncan-williams.com/  -Check out the [facility] Active Adults 50+ section on the [place] of [employer] (hiking clubs, book clubs, cards and games, chess, exercise classes, aquatic classes and much more) - see the website for details: https://www.[place].gov/departments/parks-recreation/active-adults50  -YouTube has lots of exercise videos for different ages and abilities as well  -[facility] (a variety of indoor and outdoor inperson activities for adults). [phone]. [address]  -Virtual Online Classes (a variety of topics): see seniorplanet.org or call ([phone]  -consider volunteering at a school, hospice center, church, senior center or elsewhere           Terressa Koyanagi, DO

## 2023-09-26 ENCOUNTER — Ambulatory Visit (HOSPITAL_COMMUNITY)
Admission: RE | Admit: 2023-09-26 | Discharge: 2023-09-26 | Disposition: A | Discharge status: Home / Self Care | Payer: 59 | Source: Ambulatory Visit | Attending: Family Medicine

## 2023-09-26 DIAGNOSIS — R2241 Localized swelling, mass and lump, right lower limb: Secondary | ICD-10-CM | POA: Insufficient documentation

## 2023-09-26 DIAGNOSIS — I70223 Atherosclerosis of native arteries of extremities with rest pain, bilateral legs: Secondary | ICD-10-CM | POA: Insufficient documentation

## 2023-09-26 LAB — VAS US ABI WITH/WO TBI
Left ABI: 0.89
Right ABI: 0.89

## 2023-09-29 NOTE — Progress Notes (Unsigned)
Patient ID: [name] [name], male   DOB: [birth year], 73 y.o.   MRN: [id number]  Reason for Consult: No chief complaint on file.   Referred by [name], MD  Subjective:     HPI  [name] [name] is a 73 y.o. male who presents for evaluation of chronic right leg DVT.  He he has been on chronic anticoagulation as he has had multiple DVTs and PEs.  He was most recently switched from Coumadin to Xarelto in 2017. ***   Past Medical History:  Diagnosis Date   Anxiety and depression    Back pain    Diverticulitis    Diverticulosis    DVT (deep venous thrombosis) (HCC)    Gout    Hernia    History of blood clots    Hypertension    Lesion of right native kidney 01/03/2023   Nausea and vomiting    Neuropathy, peripheral 05/15/2012   patient denies   Shingles    Family History  Problem Relation Age of Onset   Prostate cancer Father    Heart disease Father    Diabetes Mother    Heart disease Mother    Colon cancer Maternal Uncle        dx in his 29's   Pancreatic cancer Paternal Uncle    Prostate cancer Paternal Uncle    Multiple sclerosis Daughter    Prostate cancer Paternal Uncle    Esophageal cancer Neg Hx    Rectal cancer Neg Hx    Stomach cancer Neg Hx    Past Surgical History:  Procedure Laterality Date   APPENDECTOMY     BACK SURGERY     CHOLECYSTECTOMY  09/09/2006   HERNIA REPAIR     HIATAL HERNIA REPAIR     IR ANGIO INTRA EXTRACRAN SEL INTERNAL CAROTID BILAT MOD SED  01/18/2020   IR ANGIO VERTEBRAL SEL VERTEBRAL BILAT MOD SED  01/18/2020   IR US GUIDE VASC ACCESS RIGHT  01/18/2020   LEFT HEART CATHETERIZATION WITH CORONARY ANGIOGRAM N/A 08/02/2014   Procedure: LEFT HEART CATHETERIZATION WITH CORONARY ANGIOGRAM;  Surgeon: Pamella Pert, MD;  Location: [facility] CATH LAB;  Service: Cardiovascular;  Laterality: N/A;   MULTIPLE EXTRACTIONS WITH ALVEOLOPLASTY N/A 10/19/2015   Procedure: Extraction of tooth #'s 2,12,13 with alveoloplasty;  Surgeon: Charlynne Pander, DDS;  Location: [facility] OR;  Service: Oral Surgery;  Laterality: N/A;   SACRAL NERVE STIMULATOR PLACEMENT  09/09/2009   Medtronic RestoreADVANCED [phone] MRI info-[email or web address]    Short Social History:  Social History   Tobacco Use   Smoking status: Every Day    Current packs/day: 0.50    Average packs/day: 0.5 packs/day for 40.0 years (20.0 ttl pk-yrs)    Types: Cigarettes   Smokeless tobacco: Never  Substance Use Topics   Alcohol use: Yes    Alcohol/week: 0.0 standard drinks of alcohol    Comment: Occasional    Allergies  Allergen Reactions   Nsaids     Other Reaction(s): GI Intolerance, Other (See Comments)   Aspirin Other (See Comments)    ulcers   Celecoxib Other (See Comments)    Stomach irritation   Ibuprofen Other (See Comments)    ulcers   Lyrica [Pregabalin] Swelling   Vicodin [Hydrocodone-Acetaminophen]     Makes me mean     Current Outpatient Medications  Medication Sig Dispense Refill   albuterol (PROVENTIL HFA;VENTOLIN HFA) 108 (90 Base) MCG/ACT inhaler Inhale 2 puffs into the lungs every 6 (six) hours  as needed for wheezing or shortness of breath. 1 Inhaler 2   albuterol (PROVENTIL) (2.5 MG/3ML) 0.083% nebulizer solution Take 2.5 mg by nebulization every 6 (six) hours as needed for wheezing or shortness of breath.     alfuzosin (UROXATRAL) 10 MG 24 hr tablet Take 1 tablet (10 mg total) by mouth daily with breakfast. 30 tablet 11   ARIPiprazole (ABILIFY) 10 MG tablet Take 1 tablet (10 mg total) by mouth daily. 90 tablet 1   atorvastatin (LIPITOR) 20 MG tablet Take 20 mg by mouth daily.     Budeson-Glycopyrrol-Formoterol (BREZTRI AEROSPHERE) 160-9-4.8 MCG/ACT AERO Inhale 2 puffs into the lungs 2 (two) times daily. 10.7 g 11   cyclobenzaprine (FLEXERIL) 10 MG tablet Take 1 tablet (10 mg total) by mouth 3 (three) times daily as needed for muscle spasms. 60 tablet 0   DULoxetine (CYMBALTA) 20 MG  capsule Take 1 capsule (20 mg total) by mouth daily. 90 capsule 3   DULoxetine (CYMBALTA) 60 MG capsule Take 1 capsule (60 mg total) by mouth daily. 90 capsule 1   furosemide (LASIX) 40 MG tablet TAKE 1 TABLET(40 MG) BY MOUTH DAILY 90 tablet 0   hydrALAZINE (APRESOLINE) 50 MG tablet Take 50 mg by mouth in the morning and at bedtime.     hydrochlorothiazide (HYDRODIURIL) 25 MG tablet Take 1 tablet (25 mg total) by mouth daily. 90 tablet 1   Iron, Ferrous Sulfate, 325 (65 Fe) MG TABS Take 325 mg by mouth every other day. 30 tablet 5   metoprolol succinate (TOPROL-XL) 25 MG 24 hr tablet Take 25 mg by mouth daily.     montelukast (SINGULAIR) 10 MG tablet Take 10 mg by mouth daily.     nicotine (NICODERM CQ - DOSED IN MG/24 HOURS) 14 mg/24hr patch Place 1 patch (14 mg total) onto the skin daily. 28 patch 0   nicotine (NICODERM CQ - DOSED IN MG/24 HOURS) 21 mg/24hr patch Place 1 patch (21 mg total) onto the skin daily. 28 patch 0   nicotine (NICODERM CQ - DOSED IN MG/24 HR) 7 mg/24hr patch Place 1 patch (7 mg total) onto the skin daily. 28 patch 0   NIFEdipine (PROCARDIA XL/NIFEDICAL XL) 60 MG 24 hr tablet Take 120 mg by mouth daily.     Oxycodone HCl 10 MG TABS Take 10 mg by mouth every 8 (eight) hours as needed.     pantoprazole (PROTONIX) 40 MG tablet Take 40 mg by mouth 2 (two) times daily.     rivaroxaban (XARELTO) 20 MG TABS tablet Take 1 tablet (20 mg total) by mouth daily with supper. Please follow up with Primary Care MD for future refills 30 tablet 0   Varenicline Tartrate, Starter, (CHANTIX STARTING MONTH PAK) 0.5 MG X 11 & 1 MG X 42 TBPK Take by mouth.     Vitamin D, Ergocalciferol, (DRISDOL) 1.25 MG (50000 UNIT) CAPS capsule Take 1 capsule (50,000 Units total) by mouth every 7 (seven) days. 12 capsule 1   No current facility-administered medications for this visit.    REVIEW OF SYSTEMS  Positive for ***  All other systems were reviewed and are negative     Objective:  Objective    There were no vitals filed for this visit. There is no height or weight on file to calculate BMI.  Physical Exam General: no acute distress Cardiac: hemodynamically stable Pulm: normal work of breathing Abdomen: non-tender, no pulsatile mass*** Neuro: alert, no focal deficit Extremities: no edema, cyanosis or wounds*** Vascular:  Right: ***             Left: ***     Data: Right leg DVT study +---------+---------------+---------+-----------+----------+---------------  ----+  RIGHT   CompressibilityPhasicitySpontaneityPropertiesThrombus Aging        +---------+---------------+---------+-----------+----------+---------------  ----+  CFV     Partial        Yes      Yes                  Chronic               +---------+---------------+---------+-----------+----------+---------------  ----+  SFJ     Partial        Yes      Yes                                        +---------+---------------+---------+-----------+----------+---------------  ----+  FV Prox  Full                                                               +---------+---------------+---------+-----------+----------+---------------  ----+  FV Mid   Partial        No       No                   Chronic               +---------+---------------+---------+-----------+----------+---------------  ----+  FV DistalFull           Yes      Yes                                        +---------+---------------+---------+-----------+----------+---------------  ----+  PFV     Full                                                               +---------+---------------+---------+-----------+----------+---------------  ----+  POP     Full           Yes      Yes                                        +---------+---------------+---------+-----------+----------+---------------  ----+  PTV     Full                                                                +---------+---------------+---------+-----------+----------+---------------  ----+  PERO  Not well  visualized  +---------+---------------+---------+-----------+----------+---------------  ----+    ABI +---------+------------------+-----+----------+--------+  Right   Rt Pressure (mmHg)IndexWaveform  Comment   +---------+------------------+-----+----------+--------+  Brachial 139                                        +---------+------------------+-----+----------+--------+  ATA     132               0.87 monophasic          +---------+------------------+-----+----------+--------+  PTA     136               0.89 monophasic          +---------+------------------+-----+----------+--------+  Great Toe92                0.61                     +---------+------------------+-----+----------+--------+   +---------+------------------+-----+----------+-------+  Left    Lt Pressure (mmHg)IndexWaveform  Comment  +---------+------------------+-----+----------+-------+  Brachial 152                                       +---------+------------------+-----+----------+-------+  ATA     136               0.89 biphasic           +---------+------------------+-----+----------+-------+  PTA     115               0.76 monophasic         +---------+------------------+-----+----------+-------+  Great Toe96                0.63                    +---------+------------------+-----+----------+-------+    Most recent BMP reviewed, creatinine 0.97     Assessment/Plan:     [name] [name] is a 73 y.o. male ***    Recommendations to optimize cardiovascular risk: Abstinence from all tobacco products. Blood glucose control with goal A1c < 7%. Blood pressure control with goal blood pressure < 140/90 mmHg. Lipid reduction therapy with goal LDL-C <100 mg/dL  Aspirin 81mg  PO QD.   Atorvastatin 40-80mg  PO QD (or other "high intensity" statin therapy).     Daria Pastures MD [facility]

## 2023-09-30 ENCOUNTER — Ambulatory Visit: Payer: 59 | Admitting: Vascular Surgery

## 2023-09-30 DIAGNOSIS — I82531 Chronic embolism and thrombosis of right popliteal vein: Secondary | ICD-10-CM

## 2023-10-01 ENCOUNTER — Encounter: Payer: Self-pay | Admitting: Family Medicine

## 2023-10-01 ENCOUNTER — Ambulatory Visit: Payer: 59

## 2023-10-01 NOTE — Progress Notes (Signed)
Patient ID: [name] [name], male   DOB: 27-Feb-1951, 73 y.o.   MRN: [id number]  Reason for Consult: New Patient (Initial Visit)   Referred by [name], MD  Subjective:     HPI  [name] [name] is a 73 y.o. male who presents for evaluation of chronic right leg DVT.  He he has been on chronic anticoagulation as he has had multiple DVTs and PEs.  He was most recently switched from Coumadin to Xarelto in 2017.  Today he reports chronic swelling of the legs, right greater than left.  He also reports some pain coming from his right hip all the way down to his foot.  He has had 2 back surgeries in the past.  He denies rest pain or ulceration.  Past Medical History:  Diagnosis Date   Anxiety and depression    Back pain    Diverticulitis    Diverticulosis    DVT (deep venous thrombosis) (HCC)    Gout    Hernia    History of blood clots    Hypertension    Lesion of right native kidney 01/03/2023   Nausea and vomiting    Neuropathy, peripheral 05/15/2012   patient denies   Shingles    Family History  Problem Relation Age of Onset   Prostate cancer Father    Heart disease Father    Diabetes Mother    Heart disease Mother    Colon cancer Maternal Uncle        dx in his 70's   Pancreatic cancer Paternal Uncle    Prostate cancer Paternal Uncle    Multiple sclerosis Daughter    Prostate cancer Paternal Uncle    Esophageal cancer Neg Hx    Rectal cancer Neg Hx    Stomach cancer Neg Hx    Past Surgical History:  Procedure Laterality Date   APPENDECTOMY     BACK SURGERY     CHOLECYSTECTOMY  09/09/2006   HERNIA REPAIR     HIATAL HERNIA REPAIR     IR ANGIO INTRA EXTRACRAN SEL INTERNAL CAROTID BILAT MOD SED  01/18/2020   IR ANGIO VERTEBRAL SEL VERTEBRAL BILAT MOD SED  01/18/2020   IR US GUIDE VASC ACCESS RIGHT  01/18/2020   LEFT HEART CATHETERIZATION WITH CORONARY ANGIOGRAM N/A 08/02/2014   Procedure: LEFT HEART CATHETERIZATION WITH CORONARY ANGIOGRAM;  Surgeon: Pamella Pert, MD;  Location: [facility] CATH LAB;  Service: Cardiovascular;  Laterality: N/A;   MULTIPLE EXTRACTIONS WITH ALVEOLOPLASTY N/A 10/19/2015   Procedure: Extraction of tooth #'s 2,12,13 with alveoloplasty;  Surgeon: Charlynne Pander, DDS;  Location: [facility] OR;  Service: Oral Surgery;  Laterality: N/A;   SACRAL NERVE STIMULATOR PLACEMENT  09/09/2009   Medtronic RestoreADVANCED [phone] MRI info-[email or web address]    Short Social History:  Social History   Tobacco Use   Smoking status: Every Day    Current packs/day: 0.50    Average packs/day: 0.5 packs/day for 40.0 years (20.0 ttl pk-yrs)    Types: Cigarettes   Smokeless tobacco: Never  Substance Use Topics   Alcohol use: Yes    Alcohol/week: 0.0 standard drinks of alcohol    Comment: Occasional    Allergies  Allergen Reactions   Nsaids     Other Reaction(s): GI Intolerance, Other (See Comments)   Aspirin Other (See Comments)    ulcers   Celecoxib Other (See Comments)    Stomach irritation   Ibuprofen Other (See Comments)    ulcers   Lyrica [Pregabalin]  Swelling   Vicodin [Hydrocodone-Acetaminophen]     Makes me mean     Current Outpatient Medications  Medication Sig Dispense Refill   albuterol (PROVENTIL HFA;VENTOLIN HFA) 108 (90 Base) MCG/ACT inhaler Inhale 2 puffs into the lungs every 6 (six) hours as needed for wheezing or shortness of breath. 1 Inhaler 2   albuterol (PROVENTIL) (2.5 MG/3ML) 0.083% nebulizer solution Take 2.5 mg by nebulization every 6 (six) hours as needed for wheezing or shortness of breath.     ARIPiprazole (ABILIFY) 10 MG tablet Take 1 tablet (10 mg total) by mouth daily. 90 tablet 1   atorvastatin (LIPITOR) 20 MG tablet Take 20 mg by mouth daily.     Budeson-Glycopyrrol-Formoterol (BREZTRI AEROSPHERE) 160-9-4.8 MCG/ACT AERO Inhale 2 puffs into the lungs 2 (two) times daily. 10.7 g 11   cyclobenzaprine (FLEXERIL) 10 MG tablet Take 1 tablet (10 mg  total) by mouth 3 (three) times daily as needed for muscle spasms. 60 tablet 0   DULoxetine (CYMBALTA) 20 MG capsule Take 1 capsule (20 mg total) by mouth daily. 90 capsule 3   DULoxetine (CYMBALTA) 60 MG capsule Take 1 capsule (60 mg total) by mouth daily. 90 capsule 1   furosemide (LASIX) 40 MG tablet TAKE 1 TABLET(40 MG) BY MOUTH DAILY 90 tablet 0   hydrALAZINE (APRESOLINE) 50 MG tablet Take 50 mg by mouth in the morning and at bedtime.     Iron, Ferrous Sulfate, 325 (65 Fe) MG TABS Take 325 mg by mouth every other day. 30 tablet 5   metoprolol succinate (TOPROL-XL) 25 MG 24 hr tablet Take 25 mg by mouth daily.     montelukast (SINGULAIR) 10 MG tablet Take 10 mg by mouth daily.     NIFEdipine (PROCARDIA XL/NIFEDICAL XL) 60 MG 24 hr tablet Take 120 mg by mouth daily.     Oxycodone HCl 10 MG TABS Take 10 mg by mouth every 8 (eight) hours as needed.     pantoprazole (PROTONIX) 40 MG tablet Take 40 mg by mouth 2 (two) times daily.     rivaroxaban (XARELTO) 20 MG TABS tablet Take 1 tablet (20 mg total) by mouth daily with supper. Please follow up with Primary Care MD for future refills 30 tablet 0   tamsulosin (FLOMAX) 0.4 MG CAPS capsule Take 0.4 mg by mouth daily.     Vitamin D, Ergocalciferol, (DRISDOL) 1.25 MG (50000 UNIT) CAPS capsule Take 1 capsule (50,000 Units total) by mouth every 7 (seven) days. 12 capsule 1   alfuzosin (UROXATRAL) 10 MG 24 hr tablet Take 1 tablet (10 mg total) by mouth daily with breakfast. (Patient not taking: Reported on 10/03/2023) 30 tablet 11   hydrochlorothiazide (HYDRODIURIL) 25 MG tablet Take 1 tablet (25 mg total) by mouth daily. (Patient not taking: Reported on 10/03/2023) 90 tablet 1   nicotine (NICODERM CQ - DOSED IN MG/24 HOURS) 14 mg/24hr patch Place 1 patch (14 mg total) onto the skin daily. (Patient not taking: Reported on 10/03/2023) 28 patch 0   nicotine (NICODERM CQ - DOSED IN MG/24 HOURS) 21 mg/24hr patch Place 1 patch (21 mg total) onto the skin daily.  (Patient not taking: Reported on 10/03/2023) 28 patch 0   nicotine (NICODERM CQ - DOSED IN MG/24 HR) 7 mg/24hr patch Place 1 patch (7 mg total) onto the skin daily. (Patient not taking: Reported on 10/03/2023) 28 patch 0   Varenicline Tartrate, Starter, (CHANTIX STARTING MONTH PAK) 0.5 MG X 11 & 1 MG X 42 TBPK Take by  mouth. (Patient not taking: Reported on 10/03/2023)     No current facility-administered medications for this visit.    REVIEW OF SYSTEMS  Positive for shortness of breath with exertion, wheezing, weakness in arms and legs  All other systems were reviewed and are negative     Objective:  Objective   Vitals:   10/03/23 1013  BP: (!) 172/97  Pulse: 81  Resp: 20  Temp: 98.1 F (36.7 C)  SpO2: 95%  Weight: 237 lb (107.5 kg)  Height: 6\' 4"  (1.93 m)   Body mass index is 28.85 kg/m.  Physical Exam General: no acute distress Cardiac: hemodynamically stable Pulm: normal work of breathing Neuro: alert, no focal deficit Extremities: 2+ edema of right leg from ankle to knee, 1+ on the left, no cyanosis or wounds Vascular:              Right: Triphasic PT, biphasic DP             Left: Triphasic DP, PT     Data: Right leg DVT study +---------+---------------+---------+-----------+----------+---------------  ----+  RIGHT   CompressibilityPhasicitySpontaneityPropertiesThrombus Aging        +---------+---------------+---------+-----------+----------+---------------  ----+  CFV     Partial        Yes      Yes                  Chronic               +---------+---------------+---------+-----------+----------+---------------  ----+  SFJ     Partial        Yes      Yes                                        +---------+---------------+---------+-----------+----------+---------------  ----+  FV Prox  Full                                                               +---------+---------------+---------+-----------+----------+---------------   ----+  FV Mid   Partial        No       No                   Chronic               +---------+---------------+---------+-----------+----------+---------------  ----+  FV DistalFull           Yes      Yes                                        +---------+---------------+---------+-----------+----------+---------------  ----+  PFV     Full                                                               +---------+---------------+---------+-----------+----------+---------------  ----+  POP     Full           Yes  Yes                                        +---------+---------------+---------+-----------+----------+---------------  ----+  PTV     Full                                                               +---------+---------------+---------+-----------+----------+---------------  ----+  PERO                                                 Not well  visualized  +---------+---------------+---------+-----------+----------+---------------  ----+    ABI +---------+------------------+-----+----------+--------+  Right   Rt Pressure (mmHg)IndexWaveform  Comment   +---------+------------------+-----+----------+--------+  Brachial 139                                        +---------+------------------+-----+----------+--------+  ATA     132               0.87 monophasic          +---------+------------------+-----+----------+--------+  PTA     136               0.89 monophasic          +---------+------------------+-----+----------+--------+  Great Toe92                0.61                     +---------+------------------+-----+----------+--------+   +---------+------------------+-----+----------+-------+  Left    Lt Pressure (mmHg)IndexWaveform  Comment  +---------+------------------+-----+----------+-------+  Brachial 152                                        +---------+------------------+-----+----------+-------+  ATA     136               0.89 biphasic           +---------+------------------+-----+----------+-------+  PTA     115               0.76 monophasic         +---------+------------------+-----+----------+-------+  Great Toe96                0.63                    +---------+------------------+-----+----------+-------+    Most recent BMP reviewed, creatinine 0.97     Assessment/Plan:     [name] [name] is a 73 y.o. male with leg swelling due to chronic DVTs in the right lower extremity.  I explained that he will have to continue Xarelto lifelong with a history of multiple DVTs and PEs.  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 Follow-up as needed     Daria Pastures MD [facility]

## 2023-10-03 ENCOUNTER — Encounter: Payer: Self-pay | Admitting: Vascular Surgery

## 2023-10-03 ENCOUNTER — Ambulatory Visit (INDEPENDENT_AMBULATORY_CARE_PROVIDER_SITE_OTHER): Payer: 59 | Admitting: Vascular Surgery

## 2023-10-03 VITALS — BP 172/97 | HR 81 | Temp 98.1°F | Resp 20 | Ht 76.0 in | Wt 237.0 lb

## 2023-10-03 DIAGNOSIS — I82511 Chronic embolism and thrombosis of right femoral vein: Secondary | ICD-10-CM | POA: Diagnosis not present

## 2023-10-03 DIAGNOSIS — I872 Venous insufficiency (chronic) (peripheral): Secondary | ICD-10-CM | POA: Diagnosis not present

## 2023-10-06 ENCOUNTER — Ambulatory Visit: Payer: 59

## 2023-10-06 ENCOUNTER — Ambulatory Visit: Payer: 59 | Admitting: Urology

## 2023-10-06 DIAGNOSIS — I10 Essential (primary) hypertension: Secondary | ICD-10-CM

## 2023-10-06 NOTE — Progress Notes (Signed)
   10/06/2023  Patient ID: [name] [name], male   DOB: March 21, 1951, 73 y.o.   MRN: [id number]  Patient presented in office today for follow up on Medication Management.   Patient was to have brought in all of his medications along with a new pill organizer to ensure all medications were being taken correctly. Patient gets very confused and is not taking his medications as he should.  Does NOT bring in pill box as instructed and states "we need to start fresh" on med review. At time of this appt, patient states he is only taking the following: Xarelto 20mg  once daily Nifedipine 60mg  XL 2 tabs daily Cymbalta 20mg  + 60mg  Hydrochlorthiazide 25mg  once daily Metoprolol 25mg  3 tabs daily  Brings in all of the other medications on his list but states not taking because he got confused.   BP in office today 176/92, denies any symptoms of hypertension. Of note, has not taken his hydralazine today.  Future Consideration to address HTN: If BP remains elevated with proper med adherence, would consider changing nifedipine to amlodipine for less pill burden and consider a retrial of ACEI/ARB (patient tried several in past but unclear why each were d/c).  Patient states he had not purchased pill organizer because he was waiting for next month's check to purchase. Will look into providing a patient a pill organizer at no charge by next PCP visit on Monday.   Reviewed medications at length and how to take them. Provided written document and sheet to pin up next to medications with how and when to take them.   Patient stopped chantix due to vivid dreams and states Nicotine patches are not working. He started on 7mg  and thought he was to work his way up. Counseled on how to properly use patches and provided 1-800-QUITNOW number so patient can obtain patches for free.  Follow Up: 1 month, sees PCP next week  Sherrill Raring, PharmD Clinical Pharmacist [phone]

## 2023-10-09 ENCOUNTER — Ambulatory Visit: Payer: 59 | Admitting: Urology

## 2023-10-09 DIAGNOSIS — M48062 Spinal stenosis, lumbar region with neurogenic claudication: Secondary | ICD-10-CM | POA: Diagnosis not present

## 2023-10-09 DIAGNOSIS — M4326 Fusion of spine, lumbar region: Secondary | ICD-10-CM | POA: Diagnosis not present

## 2023-10-09 DIAGNOSIS — M5416 Radiculopathy, lumbar region: Secondary | ICD-10-CM | POA: Diagnosis not present

## 2023-10-13 ENCOUNTER — Ambulatory Visit: Payer: 59 | Admitting: Family Medicine

## 2023-10-16 ENCOUNTER — Encounter: Payer: Self-pay | Admitting: Family Medicine

## 2023-10-16 ENCOUNTER — Ambulatory Visit (INDEPENDENT_AMBULATORY_CARE_PROVIDER_SITE_OTHER): Payer: 59 | Admitting: Family Medicine

## 2023-10-16 VITALS — BP 154/82 | HR 90 | Temp 98.4°F | Ht 76.0 in | Wt 230.1 lb

## 2023-10-16 DIAGNOSIS — M5416 Radiculopathy, lumbar region: Secondary | ICD-10-CM

## 2023-10-16 DIAGNOSIS — I10 Essential (primary) hypertension: Secondary | ICD-10-CM

## 2023-10-16 DIAGNOSIS — Z7901 Long term (current) use of anticoagulants: Secondary | ICD-10-CM | POA: Diagnosis not present

## 2023-10-16 DIAGNOSIS — Z1211 Encounter for screening for malignant neoplasm of colon: Secondary | ICD-10-CM | POA: Diagnosis not present

## 2023-10-16 MED ORDER — HYDROCHLOROTHIAZIDE 25 MG PO TABS: 25.0000 mg | ORAL_TABLET | Freq: Every day | ORAL | 1 refills | Status: DC

## 2023-10-16 MED ORDER — OLMESARTAN MEDOXOMIL 20 MG PO TABS: 20.0000 mg | ORAL_TABLET | Freq: Every day | ORAL | 1 refills | Status: DC

## 2023-10-16 MED ORDER — NIFEDIPINE ER OSMOTIC RELEASE 60 MG PO TB24: 120.0000 mg | ORAL_TABLET | Freq: Every day | ORAL | 1 refills | Status: DC

## 2023-10-16 MED ORDER — CYCLOBENZAPRINE HCL 10 MG PO TABS: 10.0000 mg | ORAL_TABLET | Freq: Three times a day (TID) | ORAL | 1 refills | Status: AC | PRN

## 2023-10-16 MED ORDER — METOPROLOL SUCCINATE ER 25 MG PO TB24: 25.0000 mg | ORAL_TABLET | Freq: Every day | ORAL | Status: DC

## 2023-10-16 NOTE — Assessment & Plan Note (Signed)
 I called the [facility] to confirm and he is taking this medication as prescribed. A different physician is filling the xarelto  for him. He saw the vascular surgeon and I reviewed the note, recommended lifelong anticoagulation.

## 2023-10-16 NOTE — Assessment & Plan Note (Signed)
 Pt is requesting refills of his muscle relaxer, states he is only taking them at night, will refill today.

## 2023-10-16 NOTE — Progress Notes (Signed)
 Established Patient Office Visit  Subjective   Patient ID: [name] [name], male    DOB: 10-21-1950  Age: 73 y.o. MRN: [id number]  Chief Complaint  Patient presents with   Hypertension    Pt is here for HTN follow up. He did not bring his pill bottles today, he is continuing to meet with our pharmacist to help him understand his BP medications. I explained to him today that since his BP is not well controlled we will need to add medication to his regimen     Current Outpatient Medications  Medication Instructions   albuterol  (PROVENTIL  HFA;VENTOLIN  HFA) 108 (90 Base) MCG/ACT inhaler 2 puffs, Inhalation, Every 6 hours PRN   albuterol  (PROVENTIL ) 2.5 mg, Every 6 hours PRN   alfuzosin  (UROXATRAL ) 10 mg, Oral, Daily with breakfast   ARIPiprazole  (ABILIFY ) 10 mg, Oral, Daily   atorvastatin  (LIPITOR) 20 mg, Daily   Budeson-Glycopyrrol-Formoterol (BREZTRI  AEROSPHERE) 160-9-4.8 MCG/ACT AERO 2 puffs, Inhalation, 2 times daily   cyclobenzaprine  (FLEXERIL ) 10 mg, Oral, 3 times daily PRN   DULoxetine  (CYMBALTA ) 20 mg, Oral, Daily   DULoxetine  (CYMBALTA ) 60 mg, Oral, Daily   furosemide  (LASIX ) 40 MG tablet TAKE 1 TABLET(40 MG) BY MOUTH DAILY   hydrochlorothiazide  (HYDRODIURIL ) 25 mg, Oral, Daily   Iron  (Ferrous Sulfate ) 325 mg, Oral, Every other day   nicotine  (NICODERM CQ  - DOSED IN MG/24 HOURS) 21 mg, Transdermal, Daily   nicotine  (NICODERM CQ  - DOSED IN MG/24 HOURS) 14 mg, Transdermal, Daily   nicotine  (NICODERM CQ  - DOSED IN MG/24 HR) 7 mg, Transdermal, Daily   NIFEdipine  (PROCARDIA  XL/NIFEDICAL XL) 120 mg, Oral, Daily   olmesartan  (BENICAR ) 20 mg, Oral, Daily   Oxycodone  HCl 10 mg, Every 8 hours PRN   rivaroxaban  (XARELTO ) 20 mg, Oral, Daily with supper, Please follow up with Primary Care MD for future refills   Vitamin D  (Ergocalciferol ) (DRISDOL ) 50,000 Units, Oral, Every 7 days    Patient Active Problem List   Diagnosis Date Noted   Pulmonary emphysema (HCC) 08/20/2023   Vitamin  D deficiency 08/20/2023   Iron  deficiency anemia secondary to inadequate dietary iron  intake 08/20/2023   Right hip pain 05/15/2023   Tobacco dependence 05/15/2023   Lumbar radiculopathy 03/11/2023   Spinal stenosis of lumbar region 02/28/2023   HTN (hypertension), benign 02/28/2023   History of DVT (deep vein thrombosis) 02/28/2023   Hyperlipidemia 02/28/2023   Depression with anxiety 02/28/2023   Microcytic anemia 02/28/2023   Renal mass; right 01/08/2023   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   BPH with obstruction/lower urinary tract symptoms 01/08/2023   Chronic prostatitis 01/08/2023   Lesion of right native kidney 01/03/2023   Tick bite of right thigh 01/03/2023   Moderate episode of recurrent major depressive disorder (HCC) 01/03/2023   Right flank pain 05/01/2021   Chronic right-sided back pain 05/01/2021   Chronic diarrhea 05/01/2021   Primary osteoarthritis of left knee 03/29/2020   Primary osteoarthritis of right knee 07/08/2017   Complete rotator cuff tear of left shoulder 10/21/2016   Gout attack 04/02/2016   Angina pectoris (HCC) 08/01/2014   Hemorrhage of rectum and anus 03/28/2014   Neuropathy, peripheral 05/15/2012   Pulmonary embolism (HCC) 07/11/2011   DVT (deep venous thrombosis) (HCC) 07/11/2011   Abdominal pain, left lower quadrant 01/29/2011   Long term current use of anticoagulant therapy 01/29/2011   Diarrhea 01/29/2011      Review of Systems  All other systems reviewed and are negative.     Objective:  BP (!) 154/82 (BP Location: Left Arm, Patient Position: Sitting, Cuff Size: Normal)   Pulse 90   Temp 98.4 F (36.9 C) (Oral)   Ht 6' 4 (1.93 m)   Wt 230 lb 1.6 oz (104.4 kg)   SpO2 98%   BMI 28.01 kg/m    Physical Exam Vitals reviewed.  Constitutional:      Appearance: Normal appearance. He is normal weight.  Eyes:     Conjunctiva/sclera: Conjunctivae normal.  Cardiovascular:     Pulses: Normal pulses.  Pulmonary:     Effort:  Pulmonary effort is normal.  Musculoskeletal:     Right lower leg: Edema present.     Left lower leg: No edema.  Neurological:     Mental Status: He is alert and oriented to person, place, and time. Mental status is at baseline.  Psychiatric:        Mood and Affect: Mood normal.        Behavior: Behavior normal.      No results found for any visits on 10/16/23.    The ASCVD Risk score (Arnett DK, et al., 2019) failed to calculate for the following reasons:   Cannot find a previous HDL lab   Cannot find a previous total cholesterol lab    Assessment & Plan:  HTN (hypertension), benign Assessment & Plan: I spoke with the pharmacist at [facility], he has not filled the metoprolol  since June 2024, hasn't filled the nifedipine  in the last few months, just picked up the hydrochlorothiazide  a few days ago. I am certain the reason his BP is not controlled is that he is confused about his medications. I went over them again in the office, giving him a written list and highlighted the BP medications on the list. I also asked the pharmacist at [facility] to go over his medications with him when he gets there to pick up his refills. RTC in 2 months to recheck his BP. Our pharmacist Jon is also monitoring him.   Orders: -     NIFEdipine  ER Osmotic Release; Take 2 tablets (120 mg total) by mouth daily.  Dispense: 180 tablet; Refill: 1 -     Olmesartan  Medoxomil; Take 1 tablet (20 mg total) by mouth daily.  Dispense: 90 tablet; Refill: 1  Lumbar radiculopathy Assessment & Plan: Pt is requesting refills of his muscle relaxer, states he is only taking them at night, will refill today.   Orders: -     Cyclobenzaprine  HCl; Take 1 tablet (10 mg total) by mouth 3 (three) times daily as needed for muscle spasms.  Dispense: 90 tablet; Refill: 1  Primary hypertension -     hydroCHLOROthiazide ; Take 1 tablet (25 mg total) by mouth daily.  Dispense: 90 tablet; Refill: 1  Colon cancer screening -      Ambulatory referral to Gastroenterology  Long term current use of anticoagulant therapy Assessment & Plan: I called the [facility] to confirm and he is taking this medication as prescribed. A different physician is filling the xarelto  for him. He saw the vascular surgeon and I reviewed the note, recommended lifelong anticoagulation.      Return in about 2 months (around 12/14/2023) for HTN.    Heron CHRISTELLA Sharper, MD

## 2023-10-16 NOTE — Assessment & Plan Note (Signed)
 I spoke with the pharmacist at [facility], he has not filled the metoprolol  since June 2024, hasn't filled the nifedipine  in the last few months, just picked up the hydrochlorothiazide  a few days ago. I am certain the reason his BP is not controlled is that he is confused about his medications. I went over them again in the office, giving him a written list and highlighted the BP medications on the list. I also asked the pharmacist at [facility] to go over his medications with him when he gets there to pick up his refills. RTC in 2 months to recheck his BP. Our pharmacist Jon is also monitoring him.

## 2023-10-21 ENCOUNTER — Ambulatory Visit: Payer: 59 | Admitting: Urology

## 2023-10-21 NOTE — Progress Notes (Deleted)
 Assessment: 1. BPH with obstruction/lower urinary tract symptoms   2. 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 Pain in right testicle     Plan: Continue alfuzosin 10 mg daily  The cause of his right inguinal pain remains unclear.   Return to office in 3 months.  Chief Complaint:  No chief complaint on file.   History of Present Illness:  [name] [name] is a 73 y.o. male who is seen for further evaluation of BPH with LUTS, renal cysts, flank pain, testicular pain, and dysuria.    He has a history of right-sided flank pain for approximately 1 year.  He underwent back surgery in April 2023.  He continued to have right-sided flank pain. MRI of the lumbar spine from 9/23 showed 2 indeterminate right renal lesions possibly reflecting hemorrhagic cysts.  No additional imaging has been performed since that time.  Prior CT study from 8/22 did not show any obvious renal lesions.  He was seen by Dr. Sabino Gasser at [facility] in February 2024 for evaluation of prostatitis, BPH with lower urinary tract symptoms, and erectile dysfunction.  He has a history of prostatitis requiring 1 hospitalization in the past.  In February 2024, he was having symptoms of dysuria, frequency, urgency, nocturia, and incomplete emptying.  He also had some right flank pain.  He was diagnosed with prostatitis and treated with Cipro x 10 days. He also has a history of erectile dysfunction.  Sildenafil has been ineffective for him.  He was given a prescription for tadalafil 20 mg prn. PSA 2/24:  0.34  He continued to have lower urinary tract symptoms including urgency, nocturia x 4, and dysuria.  He had pain during and after urination.  The burning lasts for approximately 20 minutes and resolves.  No gross hematuria.  No recent UTIs.  He did not see any improvement in his symptoms with the Cipro. IPSS = 21. He reported a 50 lb weight loss in the past 6 months.  He was diagnosed with prostatitis and treated with  Bactrim DS x 21 days.  He was also started on tamsulosin 0.4 mg daily.  He had not had his MRI due to concerns about his ability to tolerate study given his prior experience. He completed the antibiotics and he continued on tamsulosin.  He continued to have urgency, nocturia 5-6 times, and dysuria.  He had not seen any significant improvement with current treatment.    CT abdomen with and without contrast from 02/15/2023 showed bilateral renal cyst, consistent with Bosniak 2 lesions, no solid masses or suspicious lesions.  He was also noted to have a hypoenhancing splenic lesion.  At his visit in July 2024, he noted a return of the dysuria, intermittent in nature.  He also had some urinary frequency and urgency.  He continued on tamsulosin.  No gross hematuria or flank pain. IPSS = 15. He was treated with Levaquin x 2 weeks for prostatitis.  At his visit in 9/24, he reported some improvement in his symptoms following treatment with Levaquin.  His urinary symptoms returned approximately 2 weeks after completing the antibiotic.  He had symptoms of frequency, nocturia x 4, intermittent stream, and urgency.  No dysuria or gross hematuria.  He continued on tamsulosin.  He also reported continued pain in the right inguinal area which had been present for several years and began after his last hernia surgery.  He also complained of pain in the right testicle. IPSS = 17. He was given  a trial of alfuzosin at his visit in 9/24. Scrotal ultrasound from 06/19/2023 showed a indistinct heterogeneous masslike area in the right testicle measuring 2.0 x 1.6 x 2.5 cm which appears similar to prior study from 04/23/2018.  At his visit in 10/24, he noted improvement in his urinary symptoms with alfuzosin.  He was not having any significant dysuria.  He continued with some frequency, urgency, and nocturia x 4.  He continued to drink Pepsi and tea before bed.  He continued to have some pain in the right groin area, relieved  with topical anti-inflammatory cream.  He also reported intermittent right sided scrotal pain with palpation and some left-sided scrotal pain.  Portions of the above documentation were copied from a prior visit for review purposes only.  Past Medical History:  Past Medical History:  Diagnosis Date   Anxiety and depression    Back pain    Diverticulitis    Diverticulosis    DVT (deep venous thrombosis) (HCC)    Gout    Hernia    History of blood clots    Hypertension    Lesion of right native kidney 01/03/2023   Nausea and vomiting    Neuropathy, peripheral 05/15/2012   patient denies   Shingles     Past Surgical History:  Past Surgical History:  Procedure Laterality Date   APPENDECTOMY     BACK SURGERY     CHOLECYSTECTOMY  09/09/2006   HERNIA REPAIR     HIATAL HERNIA REPAIR     IR ANGIO INTRA EXTRACRAN SEL INTERNAL CAROTID BILAT MOD SED  01/18/2020   IR ANGIO VERTEBRAL SEL VERTEBRAL BILAT MOD SED  01/18/2020   IR US GUIDE VASC ACCESS RIGHT  01/18/2020   LEFT HEART CATHETERIZATION WITH CORONARY ANGIOGRAM N/A 08/02/2014   Procedure: LEFT HEART CATHETERIZATION WITH CORONARY ANGIOGRAM;  Surgeon: Pamella Pert, MD;  Location: [facility] CATH LAB;  Service: Cardiovascular;  Laterality: N/A;   MULTIPLE EXTRACTIONS WITH ALVEOLOPLASTY N/A 10/19/2015   Procedure: Extraction of tooth #'s 2,12,13 with alveoloplasty;  Surgeon: Charlynne Pander, DDS;  Location: [facility] OR;  Service: Oral Surgery;  Laterality: N/A;   SACRAL NERVE STIMULATOR PLACEMENT  09/09/2009   [employer] RestoreADVANCED [id number] MRI info-[email or web address]    Allergies:  Allergies  Allergen Reactions   Nsaids     Other Reaction(s): GI Intolerance, Other (See Comments)   Aspirin Other (See Comments)    ulcers   Celecoxib Other (See Comments)    Stomach irritation   Ibuprofen Other (See Comments)    ulcers   Lyrica [Pregabalin] Swelling   Vicodin  [Hydrocodone-Acetaminophen]     Makes me mean     Family History:  Family History  Problem Relation Age of Onset   Prostate cancer Father    Heart disease Father    Diabetes Mother    Heart disease Mother    Colon cancer Maternal Uncle        dx in his 12's   Pancreatic cancer Paternal Uncle    Prostate cancer Paternal Uncle    Multiple sclerosis Daughter    Prostate cancer Paternal Uncle    Esophageal cancer Neg Hx    Rectal cancer Neg Hx    Stomach cancer Neg Hx     Social History:  Social History   Tobacco Use   Smoking status: Every Day    Current packs/day: 0.50    Average packs/day: 0.5 packs/day for 40.0 years (20.0 ttl pk-yrs)    Types: Cigarettes   Smokeless  tobacco: Never  Vaping Use   Vaping status: Never Used  Substance Use Topics   Alcohol use: Yes    Alcohol/week: 0.0 standard drinks of alcohol    Comment: Occasional   Drug use: Yes    Types: Marijuana    Comment: for nausea    ROS: Constitutional:  Negative for fever, chills, weight loss CV: Negative for chest pain, previous MI, hypertension Respiratory:  Negative for shortness of breath, wheezing, sleep apnea, frequent cough GI:  Negative for nausea, vomiting, bloody stool, GERD  Physical exam: There were no vitals taken for this visit. GENERAL APPEARANCE:  Well appearing, well developed, well nourished, NAD HEENT:  Atraumatic, normocephalic, oropharynx clear NECK:  Supple without lymphadenopathy or thyromegaly ABDOMEN:  Soft, non-tender, no masses EXTREMITIES:  Moves all extremities well, without clubbing, cyanosis, or edema NEUROLOGIC:  Alert and oriented x 3, normal gait, CN II-XII grossly intact MENTAL STATUS:  appropriate BACK:  Non-tender to palpation, No CVAT SKIN:  Warm, dry, and intact    Results: U/A:

## 2023-10-23 ENCOUNTER — Ambulatory Visit: Payer: 59 | Admitting: Urology

## 2023-10-23 NOTE — Progress Notes (Deleted)
 Assessment: 1. BPH with obstruction/lower urinary tract symptoms   2. 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 Pain in right testicle     Plan: Continue alfuzosin 10 mg daily  The cause of his right inguinal pain remains unclear.   Return to office in 3 months.  Chief Complaint:  No chief complaint on file.   History of Present Illness:  [name] [name] is a 73 y.o. male who is seen for further evaluation of BPH with LUTS, renal cysts, flank pain, testicular pain, and dysuria.    He has a history of right-sided flank pain for approximately 1 year.  He underwent back surgery in April 2023.  He continued to have right-sided flank pain. MRI of the lumbar spine from 9/23 showed 2 indeterminate right renal lesions possibly reflecting hemorrhagic cysts.  No additional imaging has been performed since that time.  Prior CT study from 8/22 did not show any obvious renal lesions.  He was seen by Dr. Sabino Gasser at [facility] in February 2024 for evaluation of prostatitis, BPH with lower urinary tract symptoms, and erectile dysfunction.  He has a history of prostatitis requiring 1 hospitalization in the past.  In February 2024, he was having symptoms of dysuria, frequency, urgency, nocturia, and incomplete emptying.  He also had some right flank pain.  He was diagnosed with prostatitis and treated with Cipro x 10 days. He also has a history of erectile dysfunction.  Sildenafil has been ineffective for him.  He was given a prescription for tadalafil 20 mg prn. PSA 2/24:  0.34  He continued to have lower urinary tract symptoms including urgency, nocturia x 4, and dysuria.  He had pain during and after urination.  The burning lasts for approximately 20 minutes and resolves.  No gross hematuria.  No recent UTIs.  He did not see any improvement in his symptoms with the Cipro. IPSS = 21. He reported a 50 lb weight loss in the past 6 months.  He was diagnosed with prostatitis and treated with  Bactrim DS x 21 days.  He was also started on tamsulosin 0.4 mg daily.  He had not had his MRI due to concerns about his ability to tolerate study given his prior experience. He completed the antibiotics and he continued on tamsulosin.  He continued to have urgency, nocturia 5-6 times, and dysuria.  He had not seen any significant improvement with current treatment.    CT abdomen with and without contrast from 02/15/2023 showed bilateral renal cyst, consistent with Bosniak 2 lesions, no solid masses or suspicious lesions.  He was also noted to have a hypoenhancing splenic lesion.  At his visit in July 2024, he noted a return of the dysuria, intermittent in nature.  He also had some urinary frequency and urgency.  He continued on tamsulosin.  No gross hematuria or flank pain. IPSS = 15. He was treated with Levaquin x 2 weeks for prostatitis.  At his visit in 9/24, he reported some improvement in his symptoms following treatment with Levaquin.  His urinary symptoms returned approximately 2 weeks after completing the antibiotic.  He had symptoms of frequency, nocturia x 4, intermittent stream, and urgency.  No dysuria or gross hematuria.  He continued on tamsulosin.  He also reported continued pain in the right inguinal area which had been present for several years and began after his last hernia surgery.  He also complained of pain in the right testicle. IPSS = 17. He was given  a trial of alfuzosin at his visit in 9/24. Scrotal ultrasound from 06/19/2023 showed a indistinct heterogeneous masslike area in the right testicle measuring 2.0 x 1.6 x 2.5 cm which appears similar to prior study from 04/23/2018.  At his visit in 10/24, he noted improvement in his urinary symptoms with alfuzosin.  He was not having any significant dysuria.  He continued with some frequency, urgency, and nocturia x 4.  He continued to drink Pepsi and tea before bed.  He continued to have some pain in the right groin area, relieved  with topical anti-inflammatory cream.  He also reported intermittent right sided scrotal pain with palpation and some left-sided scrotal pain.  Portions of the above documentation were copied from a prior visit for review purposes only.  Past Medical History:  Past Medical History:  Diagnosis Date   Anxiety and depression    Back pain    Diverticulitis    Diverticulosis    DVT (deep venous thrombosis) (HCC)    Gout    Hernia    History of blood clots    Hypertension    Lesion of right native kidney 01/03/2023   Nausea and vomiting    Neuropathy, peripheral 05/15/2012   patient denies   Shingles     Past Surgical History:  Past Surgical History:  Procedure Laterality Date   APPENDECTOMY     BACK SURGERY     CHOLECYSTECTOMY  09/09/2006   HERNIA REPAIR     HIATAL HERNIA REPAIR     IR ANGIO INTRA EXTRACRAN SEL INTERNAL CAROTID BILAT MOD SED  01/18/2020   IR ANGIO VERTEBRAL SEL VERTEBRAL BILAT MOD SED  01/18/2020   IR US GUIDE VASC ACCESS RIGHT  01/18/2020   LEFT HEART CATHETERIZATION WITH CORONARY ANGIOGRAM N/A 08/02/2014   Procedure: LEFT HEART CATHETERIZATION WITH CORONARY ANGIOGRAM;  Surgeon: Pamella Pert, MD;  Location: [facility] CATH LAB;  Service: Cardiovascular;  Laterality: N/A;   MULTIPLE EXTRACTIONS WITH ALVEOLOPLASTY N/A 10/19/2015   Procedure: Extraction of tooth #'s 2,12,13 with alveoloplasty;  Surgeon: Charlynne Pander, DDS;  Location: [facility] OR;  Service: Oral Surgery;  Laterality: N/A;   SACRAL NERVE STIMULATOR PLACEMENT  09/09/2009   [employer] RestoreADVANCED [id number] MRI info-[email or web address]    Allergies:  Allergies  Allergen Reactions   Nsaids     Other Reaction(s): GI Intolerance, Other (See Comments)   Aspirin Other (See Comments)    ulcers   Celecoxib Other (See Comments)    Stomach irritation   Ibuprofen Other (See Comments)    ulcers   Lyrica [Pregabalin] Swelling   Vicodin  [Hydrocodone-Acetaminophen]     Makes me mean     Family History:  Family History  Problem Relation Age of Onset   Prostate cancer Father    Heart disease Father    Diabetes Mother    Heart disease Mother    Colon cancer Maternal Uncle        dx in his 12's   Pancreatic cancer Paternal Uncle    Prostate cancer Paternal Uncle    Multiple sclerosis Daughter    Prostate cancer Paternal Uncle    Esophageal cancer Neg Hx    Rectal cancer Neg Hx    Stomach cancer Neg Hx     Social History:  Social History   Tobacco Use   Smoking status: Every Day    Current packs/day: 0.50    Average packs/day: 0.5 packs/day for 40.0 years (20.0 ttl pk-yrs)    Types: Cigarettes   Smokeless  tobacco: Never  Vaping Use   Vaping status: Never Used  Substance Use Topics   Alcohol use: Yes    Alcohol/week: 0.0 standard drinks of alcohol    Comment: Occasional   Drug use: Yes    Types: Marijuana    Comment: for nausea    ROS: Constitutional:  Negative for fever, chills, weight loss CV: Negative for chest pain, previous MI, hypertension Respiratory:  Negative for shortness of breath, wheezing, sleep apnea, frequent cough GI:  Negative for nausea, vomiting, bloody stool, GERD  Physical exam: There were no vitals taken for this visit. GENERAL APPEARANCE:  Well appearing, well developed, well nourished, NAD HEENT:  Atraumatic, normocephalic, oropharynx clear NECK:  Supple without lymphadenopathy or thyromegaly ABDOMEN:  Soft, non-tender, no masses EXTREMITIES:  Moves all extremities well, without clubbing, cyanosis, or edema NEUROLOGIC:  Alert and oriented x 3, normal gait, CN II-XII grossly intact MENTAL STATUS:  appropriate BACK:  Non-tender to palpation, No CVAT SKIN:  Warm, dry, and intact    Results: U/A:

## 2023-11-03 ENCOUNTER — Ambulatory Visit: Payer: 59

## 2023-11-03 ENCOUNTER — Other Ambulatory Visit: Payer: Self-pay

## 2023-11-03 ENCOUNTER — Ambulatory Visit: Payer: 59 | Attending: Physician Assistant | Admitting: Physical Therapy

## 2023-11-03 ENCOUNTER — Encounter: Payer: Self-pay | Admitting: Physical Therapy

## 2023-11-03 DIAGNOSIS — M5459 Other low back pain: Secondary | ICD-10-CM | POA: Insufficient documentation

## 2023-11-03 DIAGNOSIS — R29898 Other symptoms and signs involving the musculoskeletal system: Secondary | ICD-10-CM | POA: Insufficient documentation

## 2023-11-03 DIAGNOSIS — M6281 Muscle weakness (generalized): Secondary | ICD-10-CM | POA: Diagnosis not present

## 2023-11-03 NOTE — Therapy (Signed)
 OUTPATIENT PHYSICAL THERAPY THORACOLUMBAR EVALUATION   Patient Name: [name] [name] MRN: [id number] DOB:Jan 29, 1951, 73 y.o., male Today's Date: 11/03/2023  END OF SESSION:  PT End of Session - 11/03/23 1257     Visit Number 1    Number of Visits 9    Date for PT Re-Evaluation 12/01/23    Authorization Type UHC Dual    Authorization Time Period 11/03/23 to 12/01/23    PT Start Time 1147    PT Stop Time 1215   MHP, unable to tolerate interventions   PT Time Calculation (min) 28 min    Activity Tolerance Patient tolerated treatment well    Behavior During Therapy [facility] for tasks assessed/performed             Past Medical History:  Diagnosis Date   Anxiety and depression    Back pain    Diverticulitis    Diverticulosis    DVT (deep venous thrombosis) (HCC)    Gout    Hernia    History of blood clots    Hypertension    Lesion of right native kidney 01/03/2023   Nausea and vomiting    Neuropathy, peripheral 05/15/2012   patient denies   Shingles    Past Surgical History:  Procedure Laterality Date   APPENDECTOMY     BACK SURGERY     CHOLECYSTECTOMY  09/09/2006   HERNIA REPAIR     HIATAL HERNIA REPAIR     IR ANGIO INTRA EXTRACRAN SEL INTERNAL CAROTID BILAT MOD SED  01/18/2020   IR ANGIO VERTEBRAL SEL VERTEBRAL BILAT MOD SED  01/18/2020   IR US GUIDE VASC ACCESS RIGHT  01/18/2020   LEFT HEART CATHETERIZATION WITH CORONARY ANGIOGRAM N/A 08/02/2014   Procedure: LEFT HEART CATHETERIZATION WITH CORONARY ANGIOGRAM;  Surgeon: Pamella Pert, MD;  Location: [facility] CATH LAB;  Service: Cardiovascular;  Laterality: N/A;   MULTIPLE EXTRACTIONS WITH ALVEOLOPLASTY N/A 10/19/2015   Procedure: Extraction of tooth #'s 2,12,13 with alveoloplasty;  Surgeon: Charlynne Pander, DDS;  Location: [facility] OR;  Service: Oral Surgery;  Laterality: N/A;   SACRAL NERVE STIMULATOR PLACEMENT  09/09/2009   Medtronic RestoreADVANCED [phone] MRI  info-[email or web address]   Patient Active Problem List   Diagnosis Date Noted   Pulmonary emphysema (HCC) 08/20/2023   Vitamin D deficiency 08/20/2023   Iron deficiency anemia secondary to inadequate dietary iron intake 08/20/2023   Right hip pain 05/15/2023   Tobacco dependence 05/15/2023   Lumbar radiculopathy 03/11/2023   Spinal stenosis of lumbar region 02/28/2023   HTN (hypertension), benign 02/28/2023   History of DVT (deep vein thrombosis) 02/28/2023   Hyperlipidemia 02/28/2023   Depression with anxiety 02/28/2023   Microcytic anemia 02/28/2023   Renal mass; right 01/08/2023   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   BPH with obstruction/lower urinary tract symptoms 01/08/2023   Chronic prostatitis 01/08/2023   Lesion of right native kidney 01/03/2023   Tick bite of right thigh 01/03/2023   Moderate episode of recurrent major depressive disorder (HCC) 01/03/2023   Right flank pain 05/01/2021   Chronic right-sided back pain 05/01/2021   Chronic diarrhea 05/01/2021   Primary osteoarthritis of left knee 03/29/2020   Primary osteoarthritis of right knee 07/08/2017   Complete rotator cuff tear of left shoulder 10/21/2016   Gout attack 04/02/2016   Angina pectoris (HCC) 08/01/2014   Hemorrhage of rectum and anus 03/28/2014   Neuropathy, peripheral 05/15/2012   Pulmonary embolism (HCC) 07/11/2011   DVT (deep venous thrombosis) (HCC) 07/11/2011   Abdominal pain, left lower  quadrant 01/29/2011   Long term current use of anticoagulant therapy 01/29/2011   Diarrhea 01/29/2011    PCP: Nira Conn MD   REFERRING PROVIDER: Lavone Nian, PA-C  REFERRING DIAG: Fusion of Spine,lumbar region  Rationale for Evaluation and Treatment: Rehabilitation  THERAPY DIAG:  Other low back pain  Muscle weakness (generalized)  Other symptoms and signs involving the musculoskeletal system  ONSET DATE: L3-L5 laminectomy and fusion  with TLIF 03/18/23 (revision)              SUBJECTIVE:                                                                                                                                                                               SUBJECTIVE STATEMENT: Some days I just have to sit still from pain, when I take a pain pill usually I'm OK but I'm trying to get rid of them. Right now I'm in a lot of pain honestly. My wife just had surgery last week. I do not have the stimulator for my nerve anymore, it didn't do any good and that's why they took it out.   PERTINENT HISTORY:  See above   PAIN:  Are you having pain? Yes: NPRS scale: 8.5/10 Pain location: belt line down through R leg down to toes  Pain description: very sensitive, can't wear belt its so sore, aggravating pain that makes me angry  Aggravating factors: bending and walking  Relieving factors: pain meds  PRECAUTIONS: Back and Other: on chronic Xarelto for life   RED FLAGS: None   WEIGHT BEARING RESTRICTIONS: No  FALLS:  Has patient fallen in last 6 months? Yes. 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   PATIENT SURVEYS:  Modified Oswestry 31/50   COGNITION: Overall cognitive status: Within functional limits for tasks assessed       MUSCLE LENGTH:  Noted severe limitations in hip mm flexibility, HS, hip flexors per skilled observation  POSTURE: rounded shoulders, forward head, increased thoracic kyphosis, and s/p multiple lx surgeries    PALPATION:   R lumbar paraspinals very tight and tender, R glute/piriformis   LUMBAR ROM:   AROM eval  Flexion Moderate limitation   Extension Not even able  to get to neutral   Right lateral flexion Severe limitation   Left lateral flexion Severe limitation   Right rotation   Left rotation    (Blank rows = not tested)    LOWER EXTREMITY MMT:    MMT Right eval Left eval  Hip flexion 3+ pain in groin  5  Hip extension    Hip abduction 2+ 2+  Hip adduction    Hip internal rotation    Hip external rotation    Knee flexion 4 5  Knee extension 5 5  Ankle dorsiflexion 5 5  Ankle plantarflexion    Ankle inversion    Ankle eversion     (Blank rows = not tested)    TREATMENT DATE:   11/03/23  Eval, POC, HEP  MHP x10 minutes to l-spine in supported sitting                                                                                                                                    PATIENT EDUCATION:  Education details: exam findings, POC, HEP, benefits of moist vs dry heat  Person educated: Patient Education method: Programmer, multimedia, Demonstration, Verbal cues, and Handouts Education comprehension: verbalized understanding, returned demonstration, verbal cues required, and needs further education  HOME EXERCISE PROGRAM: Access Code: [email or web address] URL: [email or web address] Date: 11/03/2023 Prepared by: Nedra Hai  Exercises - Supine Transversus Abdominis Bracing - Hands on Stomach  - 2-3 x daily - 7 x weekly - 1 sets - 10 reps - 3 seconds  hold - Seated Sidebending  - 2-3 x daily - 7 x weekly - 1 sets - 10 reps - 5 seconds  hold  ASSESSMENT:  CLINICAL IMPRESSION: Patient is a 73 y.o. M who was seen today for physical therapy evaluation and treatment for skilled care s/p lx fusion. Pain was very high today and also highly irritable, eval limited and we focused more on pain reduction (tried MHP as he does have severe mm spasms on R side of trunk  likely contributing to pain) and HEP/care planning today. We will trial skilled PT services, if he is noticing improvement in pain and function at end of initial cert, we can certainly extend POC.    OBJECTIVE IMPAIRMENTS: Abnormal gait, decreased mobility, difficulty walking, decreased ROM, decreased strength, increased fascial restrictions, impaired perceived functional ability, increased muscle spasms, impaired flexibility, impaired sensation, obesity, and pain.   ACTIVITY LIMITATIONS: carrying, lifting, bending, sitting, standing, squatting, sleeping, stairs, transfers, bed mobility, reach over head, hygiene/grooming, and locomotion level  PARTICIPATION LIMITATIONS: meal prep, cleaning, laundry, driving, shopping, community activity, yard work, and church  PERSONAL FACTORS: Age, Behavior pattern, Fitness, Past/current experiences, and Time since onset of injury/illness/exacerbation are also affecting patient's functional outcome.   REHAB POTENTIAL: Fair chronicity of back issues/multiple surgeries   CLINICAL DECISION MAKING: Evolving/moderate complexity  EVALUATION COMPLEXITY: Moderate   GOALS: Goals reviewed with patient? Yes  SHORT TERM GOALS: Target date:  11/17/2023    Will be compliant with appropriate progressive HEP GOAL STATUS: Initial   2. Will demonstrate improved postural awareness with all functional tasks, use of ergonomic aides PRN/as desired GOAL STATUS: Initial   3. Will demonstrate good functional biomechanics for bed mobility and floor to waist lifting mechanics    4. Lumbar ROM to be no more than 50% limited all planes GOAL STATUS: Initial   LONG TERM GOALS: Target date: 12/01/2023    MMT to have improved by one grade all weak groups GOAL STATUS: Initial  2. Mm flexibility and spasms to have improved by at least 50% in order to improve functional movement patterns and for pain control GOAL STATUS: Initial  3. Pain to be no more than 6/10 with all  functional activities GOAL STATUS: Initial   4. Will have improved score on Modified Oswestry by at least 10 points to show improved QOL and subjective perception of condition    PLAN:  PT FREQUENCY: 2x/week  PT DURATION: 4 weeks  PLANNED INTERVENTIONS: 97164- PT Re-evaluation, 97110-Therapeutic exercises, 97530- Therapeutic activity, [id number]- Neuromuscular re-education, 97535- Self Care, 16109- Manual therapy, [id number]- Aquatic Therapy, U0454- Electrical stimulation (unattended), [id number]- Traction (mechanical), and Dry Needling.  PLAN FOR NEXT SESSION: get him moving as much as possible, gentle progressions as tolerated   Nedra Hai, PT, DPT 11/03/23 12:58 PM

## 2023-11-05 ENCOUNTER — Ambulatory Visit: Payer: 59

## 2023-11-09 ENCOUNTER — Encounter: Payer: Self-pay | Admitting: Family Medicine

## 2023-11-09 ENCOUNTER — Other Ambulatory Visit: Payer: Self-pay | Admitting: Family Medicine

## 2023-11-09 DIAGNOSIS — D7389 Other diseases of spleen: Secondary | ICD-10-CM

## 2023-11-10 ENCOUNTER — Ambulatory Visit: Payer: 59 | Attending: Physician Assistant

## 2023-11-10 ENCOUNTER — Other Ambulatory Visit: Payer: Self-pay

## 2023-11-10 ENCOUNTER — Telehealth: Payer: Self-pay

## 2023-11-10 DIAGNOSIS — M6281 Muscle weakness (generalized): Secondary | ICD-10-CM | POA: Insufficient documentation

## 2023-11-10 DIAGNOSIS — M5459 Other low back pain: Secondary | ICD-10-CM | POA: Diagnosis not present

## 2023-11-10 DIAGNOSIS — R29898 Other symptoms and signs involving the musculoskeletal system: Secondary | ICD-10-CM | POA: Diagnosis not present

## 2023-11-10 NOTE — Telephone Encounter (Signed)
 Copied from CRM [id number]. Topic: General - Other >> Nov 10, 2023  9:36 AM Alcus Dad wrote: Reason for CRM: Janella from [employer] stated that she have two orders. One order from Dr. Casimiro Needle (abdominal pelvis without) Other order with contrast only. Janella wanted to know do you want them to move forward with [facility] office. *Other office will cover both with and without [facility] Schimke office) Call [place] @ [phone] ext [phone]

## 2023-11-10 NOTE — Therapy (Signed)
 OUTPATIENT PHYSICAL THERAPY THORACOLUMBAR EVALUATION   Patient Name: [name] [name] MRN: [id number] DOB:16-Nov-1950, 73 y.o., male Today's Date: 11/10/2023  END OF SESSION:  PT End of Session - 11/10/23 1750     Visit Number 2    Date for PT Re-Evaluation 12/01/23    Authorization Type UHC Dual    Authorization Time Period 11/03/23 to 12/01/23    Progress Note Due on Visit 10    PT Start Time 1353    PT Stop Time 1432    PT Time Calculation (min) 39 min    Activity Tolerance Patient tolerated treatment well    Behavior During Therapy [facility] for tasks assessed/performed              Past Medical History:  Diagnosis Date   Anxiety and depression    Back pain    Diverticulitis    Diverticulosis    DVT (deep venous thrombosis) (HCC)    Gout    Hernia    History of blood clots    Hypertension    Lesion of right native kidney 01/03/2023   Nausea and vomiting    Neuropathy, peripheral 05/15/2012   patient denies   Shingles    Past Surgical History:  Procedure Laterality Date   APPENDECTOMY     BACK SURGERY     CHOLECYSTECTOMY  09/09/2006   HERNIA REPAIR     HIATAL HERNIA REPAIR     IR ANGIO INTRA EXTRACRAN SEL INTERNAL CAROTID BILAT MOD SED  01/18/2020   IR ANGIO VERTEBRAL SEL VERTEBRAL BILAT MOD SED  01/18/2020   IR US GUIDE VASC ACCESS RIGHT  01/18/2020   LEFT HEART CATHETERIZATION WITH CORONARY ANGIOGRAM N/A 08/02/2014   Procedure: LEFT HEART CATHETERIZATION WITH CORONARY ANGIOGRAM;  Surgeon: Pamella Pert, MD;  Location: [facility] CATH LAB;  Service: Cardiovascular;  Laterality: N/A;   MULTIPLE EXTRACTIONS WITH ALVEOLOPLASTY N/A 10/19/2015   Procedure: Extraction of tooth #'s 2,12,13 with alveoloplasty;  Surgeon: Charlynne Pander, DDS;  Location: [facility] OR;  Service: Oral Surgery;  Laterality: N/A;   SACRAL NERVE STIMULATOR PLACEMENT  09/09/2009   [employer]tronic RestoreADVANCED [phone] MRI info-[email or web address]    Patient Active Problem List   Diagnosis Date Noted   Pulmonary emphysema (HCC) 08/20/2023   Vitamin D deficiency 08/20/2023   Iron deficiency anemia secondary to inadequate dietary iron intake 08/20/2023   Right hip pain 05/15/2023   Tobacco dependence 05/15/2023   Lumbar radiculopathy 03/11/2023   Spinal stenosis of lumbar region 02/28/2023   HTN (hypertension), benign 02/28/2023   History of DVT (deep vein thrombosis) 02/28/2023   Hyperlipidemia 02/28/2023   Depression with anxiety 02/28/2023   Microcytic anemia 02/28/2023   Renal mass; right 01/08/2023   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   BPH with obstruction/lower urinary tract symptoms 01/08/2023   Chronic prostatitis 01/08/2023   Lesion of right native kidney 01/03/2023   Tick bite of right thigh 01/03/2023   Moderate episode of recurrent major depressive disorder (HCC) 01/03/2023   Right flank pain 05/01/2021   Chronic right-sided back pain 05/01/2021   Chronic diarrhea 05/01/2021   Primary osteoarthritis of left knee 03/29/2020   Primary osteoarthritis of right knee 07/08/2017   Complete rotator cuff tear of left shoulder 10/21/2016   Gout attack 04/02/2016   Angina pectoris (HCC) 08/01/2014   Hemorrhage of rectum and anus 03/28/2014   Neuropathy, peripheral 05/15/2012   Pulmonary embolism (HCC) 07/11/2011   DVT (deep venous thrombosis) (HCC) 07/11/2011   Abdominal pain, left lower quadrant 01/29/2011  Long term current use of anticoagulant therapy 01/29/2011   Diarrhea 01/29/2011    PCP: Nira Conn MD   REFERRING PROVIDER: Lavone Nian, PA-C  REFERRING DIAG: Fusion of Spine,lumbar region  Rationale for Evaluation and Treatment: Rehabilitation  THERAPY DIAG:  Other low back pain  Muscle weakness (generalized)  Other symptoms and signs involving the musculoskeletal system  ONSET DATE: L3-L5 laminectomy and fusion with TLIF 03/18/23 (revision)              SUBJECTIVE:                                                                                                                                                                                SUBJECTIVE STATEMENT: Some days I just have to sit still from pain, when I take a pain pill usually I'm OK but I'm trying to get rid of them. Right now I'm in a lot of pain honestly. My wife just had surgery last week. I do not have the stimulator for my nerve anymore, it didn't do any good and that's why they took it out.   PERTINENT HISTORY:  See above   PAIN:  Are you having pain? Yes: NPRS scale: 8.5/10 Pain location: belt line down through R leg down to toes  Pain description: very sensitive, can't wear belt its so sore, aggravating pain that makes me angry  Aggravating factors: bending and walking  Relieving factors: pain meds  PRECAUTIONS: Back and Other: on chronic Xarelto for life   RED FLAGS: None   WEIGHT BEARING RESTRICTIONS: No  FALLS:  Has patient fallen in last 6 months? Yes. 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   PATIENT SURVEYS:  Modified Oswestry 31/50   COGNITION: Overall cognitive status: Within functional limits for tasks assessed       MUSCLE LENGTH:  Noted severe limitations in hip mm flexibility, HS, hip flexors per skilled observation  POSTURE: rounded shoulders, forward head, increased thoracic kyphosis, and s/p multiple lx surgeries   PALPATION:   R lumbar paraspinals very tight and tender, R glute/piriformis    LUMBAR ROM:   AROM eval  Flexion Moderate limitation   Extension Not even able to get to neutral  Right lateral flexion Severe limitation   Left lateral flexion Severe limitation   Right rotation   Left rotation    (Blank rows = not tested)    LOWER EXTREMITY MMT:    MMT Right eval Left eval  Hip flexion 3+ pain in groin  5  Hip extension    Hip abduction 2+ 2+  Hip adduction    Hip internal rotation    Hip external rotation    Knee flexion 4 5  Knee extension 5 5  Ankle dorsiflexion 5 5  Ankle plantarflexion    Ankle inversion    Ankle eversion     (Blank rows = not tested)    TREATMENT DATE:  11/10/23:  Supine with moist heat under back for therex:  Legs on 75 cm ball for gentle low ampitude LTR " Low amplitude B knee to chest  Supine B knees extended , glut sets Supine B knees extended , heel slides, 10 x each leg Supine B knees extended, hips IR, isometric IR squeeze 5 sec holds 10 x Supine B knees extended, hips ER, isometric heel squeezes Seated B hip abd/ ER blue t band 10 reps Seated long arc quads no resistance, alternating. Advised pt to pursue lifts under his sofa to elevate 2 -3 inches due to his height and difficulty moving sit to stand   11/03/23  Eval, POC, HEP  MHP x10 minutes to l-spine in supported sitting                                                                                                                                    PATIENT EDUCATION:  Education details: exam findings, POC, HEP, benefits of moist vs dry heat  Person educated: Patient Education method: Programmer, multimedia, Demonstration, Verbal cues, and Handouts Education comprehension: verbalized understanding, returned demonstration, verbal cues required, and needs further education  HOME EXERCISE PROGRAM: Access Code: [email or web address] URL: [email or web address] Date: 11/03/2023 Prepared by: Nedra Hai  Exercises - Supine Transversus Abdominis Bracing -  Hands on Stomach  - 2-3 x daily - 7 x weekly - 1 sets - 10 reps - 3 seconds  hold - Seated Sidebending  - 2-3 x daily - 7 x weekly - 1 sets - 10 reps - 5 seconds  hold  ASSESSMENT:  CLINICAL IMPRESSION: Patient is a 73 y.o. M who participated today in physical therapy treatment for skilled care s/p lx fusion. Pain was again very high today and also highly irritable. Utilized primarily isometrics and very short, low amplitude, gentle movements to engage his hip and lumbar musculature.which he tolerated ok.  Will continue to progress and assess.      OBJECTIVE IMPAIRMENTS: Abnormal gait, decreased mobility, difficulty walking, decreased ROM, decreased strength, increased fascial restrictions, impaired perceived functional ability, increased muscle spasms, impaired flexibility, impaired sensation, obesity, and pain.   ACTIVITY LIMITATIONS: carrying, lifting, bending, sitting, standing, squatting, sleeping, stairs, transfers, bed mobility, reach over  head, hygiene/grooming, and locomotion level  PARTICIPATION LIMITATIONS: meal prep, cleaning, laundry, driving, shopping, community activity, yard work, and church  PERSONAL FACTORS: Age, Behavior pattern, Fitness, Past/current experiences, and Time since onset of injury/illness/exacerbation are also affecting patient's functional outcome.   REHAB POTENTIAL: Fair chronicity of back issues/multiple surgeries   CLINICAL DECISION MAKING: Evolving/moderate complexity  EVALUATION COMPLEXITY: Moderate   GOALS: Goals reviewed with patient? Yes  SHORT TERM GOALS: Target date: 11/17/2023    Will be compliant with appropriate progressive HEP GOAL STATUS: Initial   2. Will demonstrate improved postural awareness with all functional tasks, use of ergonomic aides PRN/as desired GOAL STATUS: Initial   3. Will demonstrate good functional biomechanics for bed mobility and floor to waist lifting mechanics    4. Lumbar ROM to be no more than 50% limited  all planes GOAL STATUS: Initial   LONG TERM GOALS: Target date: 12/01/2023    MMT to have improved by one grade all weak groups GOAL STATUS: Initial  2. Mm flexibility and spasms to have improved by at least 50% in order to improve functional movement patterns and for pain control GOAL STATUS: Initial  3. Pain to be no more than 6/10 with all functional activities GOAL STATUS: Initial   4. Will have improved score on Modified Oswestry by at least 10 points to show improved QOL and subjective perception of condition    PLAN:  PT FREQUENCY: 2x/week  PT DURATION: 4 weeks  PLANNED INTERVENTIONS: 97164- PT Re-evaluation, 97110-Therapeutic exercises, 97530- Therapeutic activity, [id number]- Neuromuscular re-education, 97535- Self Care, 65784- Manual therapy, [id number]- Aquatic Therapy, O9629- Electrical stimulation (unattended), [id number]- Traction (mechanical), and Dry Needling.  PLAN FOR NEXT SESSION: get him moving as much as possible, gentle progressions as tolerated   Nyheem Binette PT, DPT, OCS 11/10/23 5:52 PM

## 2023-11-11 NOTE — Telephone Encounter (Signed)
 Ok to go with the GI order-- please cancel mine

## 2023-11-11 NOTE — Telephone Encounter (Signed)
 Left a detailed message with the request as below on [name] [name]'s voicemail.

## 2023-11-12 ENCOUNTER — Ambulatory Visit: Payer: 59 | Admitting: Family Medicine

## 2023-11-12 DIAGNOSIS — M4326 Fusion of spine, lumbar region: Secondary | ICD-10-CM | POA: Diagnosis not present

## 2023-11-12 DIAGNOSIS — M5416 Radiculopathy, lumbar region: Secondary | ICD-10-CM | POA: Diagnosis not present

## 2023-11-12 DIAGNOSIS — M7061 Trochanteric bursitis, right hip: Secondary | ICD-10-CM | POA: Diagnosis not present

## 2023-11-13 ENCOUNTER — Ambulatory Visit: Payer: 59 | Admitting: Physical Therapy

## 2023-11-17 ENCOUNTER — Ambulatory Visit (INDEPENDENT_AMBULATORY_CARE_PROVIDER_SITE_OTHER): Payer: 59

## 2023-11-17 DIAGNOSIS — I10 Essential (primary) hypertension: Secondary | ICD-10-CM

## 2023-11-18 ENCOUNTER — Ambulatory Visit: Payer: 59 | Admitting: Physical Therapy

## 2023-11-18 NOTE — Progress Notes (Signed)
   11/18/2023  Patient ID: [name] [name], male   DOB: Jun 19, 1951, 73 y.o.   MRN: [id number]  Patient presents in office today, again unsure of what exactly he is taking.  Concerning observations during today's appointment: Still has numerous duplicate bottles of rx meds, which is contributing to patient's confusion. Also has xarelto in oxycodone bottle and cymbalta 20mg  in cymbalta 60mg  bottle.  Patient reports he combines multiple meds in one bottle for on the go purposes. Strongly counseled against this as it is very likely to cause confusion and med errors.  Patient presents with a weekly pill organizer. Sorted his medications as listed below:  Alfuzosin 10mg  1 tab daily in the morning Pantoprazole 40mg  1 tab twice daily, morning and evening Duloxetine 20mg  1 capsule daily in the morning Duloxetine 60mg  1 capsule daily in the morning Hydrochlorothiazide 25mg  daily in the morning Atorvastatin 20mg  1 daily at bedtime Aripiprazole 10mg  1 daily in the morning Lubiprostone 1 tab twice daily, morning and evening Xarelto 20mg  1 tab daily in the evening Montelukast 10mg  daily at bedtime Olmesartan 20mg  1 tab daily in the morning Nifedipine XL 60mg  2 tabs daily in the morning  PRN meds left out of organizer.   Recommended patient use compliance packaging services. Patient elects to sign up for [employer]ose, which is what his wife uses. Offered to get patient signed up together but he declines and will sign up before our next follow up visit.  Checked BP and it is improving, 138/92 left arm sitting in office today.  Follow Up: 1 week  Sherrill Raring, PharmD Clinical Pharmacist [phone]

## 2023-11-20 ENCOUNTER — Ambulatory Visit: Payer: 59 | Admitting: Physical Therapy

## 2023-11-20 ENCOUNTER — Telehealth: Payer: Self-pay | Admitting: Physical Therapy

## 2023-11-20 NOTE — Telephone Encounter (Signed)
 Pt had 2nd no-show this week, called and spoke to him- he reports there was a death in the family and he is currently out of town due to this. He confirms he should be able to make his upcoming appt on Monday.   Nedra Hai, PT, DPT 11/20/23 2:09 PM

## 2023-11-24 ENCOUNTER — Ambulatory Visit: Payer: 59

## 2023-11-25 ENCOUNTER — Other Ambulatory Visit

## 2023-11-26 ENCOUNTER — Ambulatory Visit: Payer: 59

## 2023-11-26 ENCOUNTER — Telehealth: Payer: Self-pay

## 2023-11-26 NOTE — Therapy (Unsigned)
 OUTPATIENT PHYSICAL THERAPY THORACOLUMBAR EVALUATION   Patient Name: [name] [name] MRN: [id number] DOB:03/07/1951, 73 y.o., male Today's Date: 11/26/2023  END OF SESSION:     Past Medical History:  Diagnosis Date   Anxiety and depression    Back pain    Diverticulitis    Diverticulosis    DVT (deep venous thrombosis) (HCC)    Gout    Hernia    History of blood clots    Hypertension    Lesion of right native kidney 01/03/2023   Nausea and vomiting    Neuropathy, peripheral 05/15/2012   patient denies   Shingles    Past Surgical History:  Procedure Laterality Date   APPENDECTOMY     BACK SURGERY     CHOLECYSTECTOMY  09/09/2006   HERNIA REPAIR     HIATAL HERNIA REPAIR     IR ANGIO INTRA EXTRACRAN SEL INTERNAL CAROTID BILAT MOD SED  01/18/2020   IR ANGIO VERTEBRAL SEL VERTEBRAL BILAT MOD SED  01/18/2020   IR US GUIDE VASC ACCESS RIGHT  01/18/2020   LEFT HEART CATHETERIZATION WITH CORONARY ANGIOGRAM N/A 08/02/2014   Procedure: LEFT HEART CATHETERIZATION WITH CORONARY ANGIOGRAM;  Surgeon: Pamella Pert, MD;  Location: [facility] CATH LAB;  Service: Cardiovascular;  Laterality: N/A;   MULTIPLE EXTRACTIONS WITH ALVEOLOPLASTY N/A 10/19/2015   Procedure: Extraction of tooth #'s 2,12,13 with alveoloplasty;  Surgeon: Charlynne Pander, DDS;  Location: [facility] OR;  Service: Oral Surgery;  Laterality: N/A;   SACRAL NERVE STIMULATOR PLACEMENT  09/09/2009   [employer] Restore[employer]VANCED [phone] MRI info-[email or web address]   Patient Active Problem List   Diagnosis Date Noted   Pulmonary emphysema (HCC) 08/20/2023   Vitamin D deficiency 08/20/2023   Iron deficiency anemia secondary to inadequate dietary iron intake 08/20/2023   Right hip pain 05/15/2023   Tobacco dependence 05/15/2023   Lumbar radiculopathy 03/11/2023   Spinal stenosis of lumbar region 02/28/2023   HTN (hypertension), benign 02/28/2023   History of DVT (deep vein  thrombosis) 02/28/2023   Hyperlipidemia 02/28/2023   Depression with anxiety 02/28/2023   Microcytic anemia 02/28/2023   Renal mass; right 01/08/2023   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   BPH with obstruction/lower urinary tract symptoms 01/08/2023   Chronic prostatitis 01/08/2023   Lesion of right native kidney 01/03/2023   Tick bite of right thigh 01/03/2023   Moderate episode of recurrent major depressive disorder (HCC) 01/03/2023   Right flank pain 05/01/2021   Chronic right-sided back pain 05/01/2021   Chronic diarrhea 05/01/2021   Primary osteoarthritis of left knee 03/29/2020   Primary osteoarthritis of right knee 07/08/2017   Complete rotator cuff tear of left shoulder 10/21/2016   Gout attack 04/02/2016   Angina pectoris (HCC) 08/01/2014   Hemorrhage of rectum and anus 03/28/2014   Neuropathy, peripheral 05/15/2012   Pulmonary embolism (HCC) 07/11/2011   DVT (deep venous thrombosis) (HCC) 07/11/2011   Abdominal pain, left lower quadrant 01/29/2011   Long term current use of anticoagulant therapy 01/29/2011   Diarrhea 01/29/2011    PCP: Nira Conn MD   REFERRING PROVIDER: Lavone Nian, PA-C  REFERRING DIAG: Fusion of Spine,lumbar region  Rationale for Evaluation and Treatment: Rehabilitation  THERAPY DIAG:  No diagnosis found.  ONSET DATE: L3-L5 laminectomy and fusion with TLIF 03/18/23 (revision)              SUBJECTIVE:  SUBJECTIVE STATEMENT: Some days I just have to sit still from pain, when I take a pain pill usually I'm OK but I'm trying to get rid of them. Right now I'm in a lot of pain honestly. My wife just had surgery last week. I do not have the stimulator for my nerve anymore, it didn't do any good and that's why they took it out.   PERTINENT HISTORY:  See above   PAIN:   Are you having pain? Yes: NPRS scale: 8.5/10 Pain location: belt line down through R leg down to toes  Pain description: very sensitive, can't wear belt its so sore, aggravating pain that makes me angry  Aggravating factors: bending and walking  Relieving factors: pain meds  PRECAUTIONS: Back and Other: on chronic Xarelto for life   RED FLAGS: None   WEIGHT BEARING RESTRICTIONS: No  FALLS:  Has patient fallen in last 6 months? Yes. 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   PATIENT SURVEYS:  Modified Oswestry 31/50   COGNITION: Overall cognitive status: Within functional limits for tasks assessed       MUSCLE LENGTH:  Noted severe limitations in hip mm flexibility, HS, hip flexors per skilled observation  POSTURE: rounded shoulders, forward head, increased thoracic kyphosis, and s/p multiple lx surgeries   PALPATION:   R lumbar paraspinals very tight and tender, R glute/piriformis   LUMBAR ROM:   AROM eval  Flexion Moderate limitation   Extension Not even able to get to neutral   Right lateral flexion Severe limitation   Left lateral flexion Severe limitation   Right rotation   Left rotation    (Blank rows = not tested)    LOWER EXTREMITY MMT:    MMT Right eval Left eval  Hip flexion 3+ pain in groin  5  Hip extension    Hip abduction 2+ 2+  Hip adduction    Hip internal rotation    Hip external rotation    Knee flexion 4 5  Knee extension 5 5  Ankle dorsiflexion 5 5   Ankle plantarflexion    Ankle inversion    Ankle eversion     (Blank rows = not tested)    TREATMENT DATE:  11/10/23:  Supine with moist heat under back for therex:  Legs on 75 cm ball for gentle low ampitude LTR " Low amplitude B knee to chest  Supine B knees extended , glut sets Supine B knees extended , heel slides, 10 x each leg Supine B knees extended, hips IR, isometric IR squeeze 5 sec holds 10 x Supine B knees extended, hips ER, isometric heel squeezes Seated B hip abd/ ER blue t band 10 reps Seated long arc quads no resistance, alternating. Advised pt to pursue lifts under his sofa to elevate 2 -3 inches due to his height and difficulty moving sit to stand   11/03/23  Eval, POC, HEP  MHP x10 minutes to l-spine in supported sitting  PATIENT EDUCATION:  Education details: exam findings, POC, HEP, benefits of moist vs dry heat  Person educated: Patient Education method: Explanation, Demonstration, Verbal cues, and Handouts Education comprehension: verbalized understanding, returned demonstration, verbal cues required, and needs further education  HOME EXERCISE PROGRAM: Access Code: [email or web address] URL: [email or web address] Date: 11/03/2023 Prepared by: Nedra Hai  Exercises - Supine Transversus Abdominis Bracing - Hands on Stomach  - 2-3 x daily - 7 x weekly - 1 sets - 10 reps - 3 seconds  hold - Seated Sidebending  - 2-3 x daily - 7 x weekly - 1 sets - 10 reps - 5 seconds  hold  ASSESSMENT:  CLINICAL IMPRESSION: Patient is a 73 y.o. M who participated today in physical therapy treatment for skilled care s/p lx fusion. Pain was again very high today and also highly irritable. Utilized primarily isometrics and very short, low amplitude, gentle movements to engage his hip and lumbar musculature.which he tolerated ok.  Will  continue to progress and assess.      OBJECTIVE IMPAIRMENTS: Abnormal gait, decreased mobility, difficulty walking, decreased ROM, decreased strength, increased fascial restrictions, impaired perceived functional ability, increased muscle spasms, impaired flexibility, impaired sensation, obesity, and pain.   ACTIVITY LIMITATIONS: carrying, lifting, bending, sitting, standing, squatting, sleeping, stairs, transfers, bed mobility, reach over head, hygiene/grooming, and locomotion level  PARTICIPATION LIMITATIONS: meal prep, cleaning, laundry, driving, shopping, community activity, yard work, and church  PERSONAL FACTORS: Age, Behavior pattern, Fitness, Past/current experiences, and Time since onset of injury/illness/exacerbation are also affecting patient's functional outcome.   REHAB POTENTIAL: Fair chronicity of back issues/multiple surgeries   CLINICAL DECISION MAKING: Evolving/moderate complexity  EVALUATION COMPLEXITY: Moderate   GOALS: Goals reviewed with patient? Yes  SHORT TERM GOALS: Target date: 11/17/2023    Will be compliant with appropriate progressive HEP GOAL STATUS: Initial   2. Will demonstrate improved postural awareness with all functional tasks, use of ergonomic aides PRN/as desired GOAL STATUS: Initial   3. Will demonstrate good functional biomechanics for bed mobility and floor to waist lifting mechanics    4. Lumbar ROM to be no more than 50% limited all planes GOAL STATUS: Initial   LONG TERM GOALS: Target date: 12/01/2023    MMT to have improved by one grade all weak groups GOAL STATUS: Initial  2. Mm flexibility and spasms to have improved by at least 50% in order to improve functional movement patterns and for pain control GOAL STATUS: Initial  3. Pain to be no more than 6/10 with all functional activities GOAL STATUS: Initial   4. Will have improved score on Modified Oswestry by at least 10 points to show improved QOL and subjective perception  of condition    PLAN:  PT FREQUENCY: 2x/week  PT DURATION: 4 weeks  PLANNED INTERVENTIONS: 97164- PT Re-evaluation, 97110-Therapeutic exercises, 97530- Therapeutic activity, [id number]- Neuromuscular re-education, 97535- Self Care, 78295- Manual therapy, [id number]- Aquatic Therapy, A2130- Electrical stimulation (unattended), [id number]- Traction (mechanical), and Dry Needling.  PLAN FOR NEXT SESSION: get him moving as much as possible, gentle progressions as tolerated   Dell Hurtubise PT, DPT, OCS 11/26/23 1:02 PM

## 2023-11-26 NOTE — Progress Notes (Signed)
   11/26/2023  Patient ID: [name] [name], male   DOB: Jul 05, 1951, 73 y.o.   MRN: [id number]  Reached back out to patient to reschedule our missed follow up appt from 11/25/23.  Patient states he has started the process of signing up for divvyDose pill packaging and his wife has a scheduled call with them tomorrow morning.  Reminding patient of his appt in office tomorrow for elevated BP and importance of med adherence to keep BP under control.  Follow Up Rescheduled for 12/03/23 at 9am  Sherrill Raring, PharmD Clinical Pharmacist [phone]

## 2023-11-27 ENCOUNTER — Encounter: Payer: Self-pay | Admitting: Family Medicine

## 2023-11-27 ENCOUNTER — Ambulatory Visit (INDEPENDENT_AMBULATORY_CARE_PROVIDER_SITE_OTHER): Admitting: Family Medicine

## 2023-11-27 ENCOUNTER — Ambulatory Visit (INDEPENDENT_AMBULATORY_CARE_PROVIDER_SITE_OTHER)

## 2023-11-27 VITALS — BP 164/82 | HR 110 | Temp 98.7°F | Ht 76.0 in | Wt 231.4 lb

## 2023-11-27 DIAGNOSIS — I700 Atherosclerosis of aorta: Secondary | ICD-10-CM | POA: Diagnosis not present

## 2023-11-27 DIAGNOSIS — M545 Low back pain, unspecified: Secondary | ICD-10-CM

## 2023-11-27 DIAGNOSIS — Z720 Tobacco use: Secondary | ICD-10-CM | POA: Diagnosis not present

## 2023-11-27 DIAGNOSIS — I160 Hypertensive urgency: Secondary | ICD-10-CM

## 2023-11-27 DIAGNOSIS — F17200 Nicotine dependence, unspecified, uncomplicated: Secondary | ICD-10-CM

## 2023-11-27 DIAGNOSIS — F17210 Nicotine dependence, cigarettes, uncomplicated: Secondary | ICD-10-CM | POA: Diagnosis not present

## 2023-11-27 DIAGNOSIS — N2889 Other specified disorders of kidney and ureter: Secondary | ICD-10-CM | POA: Diagnosis not present

## 2023-11-27 DIAGNOSIS — N281 Cyst of kidney, acquired: Secondary | ICD-10-CM

## 2023-11-27 LAB — POC URINALSYSI DIPSTICK (AUTOMATED)
Bilirubin, UA: NEGATIVE
Blood, UA: NEGATIVE
Glucose, UA: NEGATIVE
Ketones, UA: NEGATIVE
Leukocytes, UA: NEGATIVE
Nitrite, UA: NEGATIVE
Protein, UA: NEGATIVE
Spec Grav, UA: 1.015 (ref 1.010–1.025)
Urobilinogen, UA: 0.2 U/dL
pH, UA: 6.5 (ref 5.0–8.0)

## 2023-11-27 LAB — COMPREHENSIVE METABOLIC PANEL
ALT: 14 U/L (ref 0–53)
AST: 16 U/L (ref 0–37)
Albumin: 4.4 g/dL (ref 3.5–5.2)
Alkaline Phosphatase: 51 U/L (ref 39–117)
BUN: 7 mg/dL (ref 6–23)
CO2: 34 meq/L — ABNORMAL HIGH (ref 19–32)
Calcium: 9.8 mg/dL (ref 8.4–10.5)
Chloride: 99 meq/L (ref 96–112)
Creatinine, Ser: 0.8 mg/dL (ref 0.40–1.50)
GFR: 87.75 mL/min (ref >60.00)
Glucose, Bld: 112 mg/dL — ABNORMAL HIGH (ref 70–99)
Potassium: 4.1 meq/L (ref 3.5–5.1)
Sodium: 141 meq/L (ref 135–145)
Total Bilirubin: 0.3 mg/dL (ref 0.2–1.2)
Total Protein: 6.5 g/dL (ref 6.0–8.3)

## 2023-11-27 LAB — CBC WITH DIFFERENTIAL/PLATELET
Basophils Absolute: 0 10*3/uL (ref 0.0–0.1)
Basophils Relative: 0.6 % (ref 0.0–3.0)
Eosinophils Absolute: 0 10*3/uL (ref 0.0–0.7)
Eosinophils Relative: 0.5 % (ref 0.0–5.0)
HCT: 38.3 % — ABNORMAL LOW (ref 39.0–52.0)
Hemoglobin: 12.1 g/dL — ABNORMAL LOW (ref 13.0–17.0)
Lymphocytes Relative: 24.6 % (ref 12.0–46.0)
Lymphs Abs: 1.3 10*3/uL (ref 0.7–4.0)
MCHC: 31.7 g/dL (ref 30.0–36.0)
MCV: 70.1 fl — ABNORMAL LOW (ref 78.0–100.0)
Monocytes Absolute: 0.4 10*3/uL (ref 0.1–1.0)
Monocytes Relative: 6.9 % (ref 3.0–12.0)
Neutro Abs: 3.7 10*3/uL (ref 1.4–7.7)
Neutrophils Relative %: 67.4 % (ref 43.0–77.0)
Platelets: 244 10*3/uL (ref 150.0–400.0)
RBC: 5.46 Mil/uL (ref 4.22–5.81)
RDW: 17 % — ABNORMAL HIGH (ref 11.5–15.5)
WBC: 5.5 10*3/uL (ref 4.0–10.5)

## 2023-11-27 LAB — URINALYSIS, MICROSCOPIC ONLY

## 2023-11-27 NOTE — Patient Instructions (Addendum)
 It appears that you have a CT of your abdomen and pelvis scheduled for 12/02/2023.  The details regarding time and place should print out below on this AVS.  You may have a kidney stone which is causing your problems.  It is important that you increase your intake of water and fluids.  For now we will obtain labs and x-ray to evaluate your current symptoms.  Because she received her pain medication from another provider you most likely have a pain contract with them and are unable to get additional pain medication from other providers.  Continue taking the muscle relaxer and oxycodone as needed.  You can use heat, topical medication such as Biofreeze or IcyHot.  If you would like your symptoms are becoming worse or you cannot manage them at home consider going to the emergency department.

## 2023-11-27 NOTE — Progress Notes (Signed)
 Established Patient Office Visit   Subjective  Patient ID: [name] [name], male    DOB: [birth year]  Age: 73 y.o. MRN: [id number]  Chief Complaint  Patient presents with   Hypertension    Bp has been running 180/114 @ home, headaches    Back Pain    Right side lower back pain started 5 days ago rate of pain 10/10 patient is also having dark urine with an odor, and left side stomach pain     Patient is a 73 year old male followed by Dr. Casimiro Needle and seen for acute concern.  Patient endorses increasing right sided mid/low back pain x 1 week.  Patient states sensation is sharp, hurts to breathe.  Started having muscle spasms at night on right side of back making it difficult to sleep.  Patient is on chronic narcotics for history of nerve stimulator removal by Ortho.  Took oxycodone 15 mg without improvement in symptoms.  Took oxycodone 10 mg this morning but endorses 10/10 pain.  Patient also endorses urinary frequency, dark, malodorous urine.  Patient endorses drinking 2 sodas per day, a cup of coffee, and maybe a few liters of water.  Patient states BP typically increases when he is in pain.  Endorses taking BP meds this morning 160/97, 180/114.  Patient endorses starting smoking at age 60.  Trying to quit.    Patient Active Problem List   Diagnosis Date Noted   Pulmonary emphysema (HCC) 08/20/2023   Vitamin D deficiency 08/20/2023   Iron deficiency anemia secondary to inadequate dietary iron intake 08/20/2023   Right hip pain 05/15/2023   Tobacco dependence 05/15/2023   Lumbar radiculopathy 03/11/2023   Spinal stenosis of lumbar region 02/28/2023   HTN (hypertension), benign 02/28/2023   History of DVT (deep vein thrombosis) 02/28/2023   Hyperlipidemia 02/28/2023   Depression with anxiety 02/28/2023   Microcytic anemia 02/28/2023   Renal mass; right 01/08/2023   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   BPH with obstruction/lower urinary tract symptoms 01/08/2023   Chronic prostatitis  01/08/2023   Lesion of right native kidney 01/03/2023   Tick bite of right thigh 01/03/2023   Moderate episode of recurrent major depressive disorder (HCC) 01/03/2023   Right flank pain 05/01/2021   Chronic right-sided back pain 05/01/2021   Chronic diarrhea 05/01/2021   Primary osteoarthritis of left knee 03/29/2020   Primary osteoarthritis of right knee 07/08/2017   Complete rotator cuff tear of left shoulder 10/21/2016   Gout attack 04/02/2016   Angina pectoris (HCC) 08/01/2014   Hemorrhage of rectum and anus 03/28/2014   Neuropathy, peripheral 05/15/2012   Pulmonary embolism (HCC) 07/11/2011   DVT (deep venous thrombosis) (HCC) 07/11/2011   Abdominal pain, left lower quadrant 01/29/2011   Long term current use of anticoagulant therapy 01/29/2011   Diarrhea 01/29/2011   Past Medical History:  Diagnosis Date   Anxiety and depression    Back pain    Diverticulitis    Diverticulosis    DVT (deep venous thrombosis) (HCC)    Gout    Hernia    History of blood clots    Hypertension    Lesion of right native kidney 01/03/2023   Nausea and vomiting    Neuropathy, peripheral 05/15/2012   patient denies   Shingles    Past Surgical History:  Procedure Laterality Date   APPENDECTOMY     BACK SURGERY     CHOLECYSTECTOMY  09/09/2006   HERNIA REPAIR     HIATAL HERNIA REPAIR  IR ANGIO INTRA EXTRACRAN SEL INTERNAL CAROTID BILAT MOD SED  01/18/2020   IR ANGIO VERTEBRAL SEL VERTEBRAL BILAT MOD SED  01/18/2020   IR US GUIDE VASC ACCESS RIGHT  01/18/2020   LEFT HEART CATHETERIZATION WITH CORONARY ANGIOGRAM N/A 08/02/2014   Procedure: LEFT HEART CATHETERIZATION WITH CORONARY ANGIOGRAM;  Surgeon: Pamella Pert, MD;  Location: [facility] CATH LAB;  Service: Cardiovascular;  Laterality: N/A;   MULTIPLE EXTRACTIONS WITH ALVEOLOPLASTY N/A 10/19/2015   Procedure: Extraction of tooth #'s 2,12,13 with alveoloplasty;  Surgeon: Charlynne Pander, DDS;  Location: [facility] OR;  Service: Oral Surgery;   Laterality: N/A;   SACRAL NERVE STIMULATOR PLACEMENT  09/09/2009   [employer]ore[employer]VANCED [phone] MRI info-[email or web address]   Social History   Tobacco Use   Smoking status: Every Day    Current packs/day: 0.50    Average packs/day: 0.5 packs/day for 40.0 years (20.0 ttl pk-yrs)    Types: Cigarettes   Smokeless tobacco: Never  Vaping Use   Vaping status: Never Used  Substance Use Topics   Alcohol use: Yes    Alcohol/week: 0.0 standard drinks of alcohol    Comment: Occasional   Drug use: Yes    Types: Marijuana    Comment: for nausea   Family History  Problem Relation Age of Onset   Prostate cancer Father    Heart disease Father    Diabetes Mother    Heart disease Mother    Colon cancer Maternal Uncle        dx in his 33's   Pancreatic cancer Paternal Uncle    Prostate cancer Paternal Uncle    Multiple sclerosis Daughter    Prostate cancer Paternal Uncle    Esophageal cancer Neg Hx    Rectal cancer Neg Hx    Stomach cancer Neg Hx    Allergies  Allergen Reactions   Nsaids     Other Reaction(s): GI Intolerance, Other (See Comments)   Aspirin Other (See Comments)    ulcers   Celecoxib Other (See Comments)    Stomach irritation   Ibuprofen Other (See Comments)    ulcers   Pregabalin Swelling and Other (See Comments)   Vicodin [Hydrocodone-Acetaminophen]     Makes me mean       ROS Negative unless stated above    Objective:     BP (!) 164/82 (BP Location: Left Arm, Patient Position: Sitting, Cuff Size: Normal)   Pulse (!) 110   Temp 98.7 F (37.1 C) (Oral)   Ht 6\' 4"  (1.93 m)   Wt 231 lb 6.4 oz (105 kg)   SpO2 95%   BMI 28.17 kg/m  BP Readings from Last 3 Encounters:  11/27/23 (!) 164/82  10/16/23 (!) 154/82  10/03/23 (!) 172/97   Wt Readings from Last 3 Encounters:  11/27/23 231 lb 6.4 oz (105 kg)  10/16/23 230 lb 1.6 oz (104.4 kg)  10/03/23 237 lb (107.5 kg)      Physical  Exam Constitutional:      General: He is in acute distress.     Appearance: Normal appearance.  HENT:     Head: Normocephalic and atraumatic.     Nose: Nose normal.     Mouth/Throat:     Mouth: Mucous membranes are moist.  Cardiovascular:     Rate and Rhythm: Regular rhythm.     Heart sounds: Normal heart sounds. No murmur heard.    No gallop.  Pulmonary:     Effort: Pulmonary effort is normal. No respiratory distress.  Breath sounds: Normal breath sounds. No wheezing, rhonchi or rales.  Abdominal:     General: Bowel sounds are normal.     Tenderness: There is right CVA tenderness and left CVA tenderness.  Skin:    General: Skin is warm and dry.  Neurological:     Mental Status: He is alert and oriented to person, place, and time.    Results for orders placed or performed in visit on 11/27/23  POCT Urinalysis Dipstick (Automated)  Result Value Ref Range   Color, UA amber    Clarity, UA clear    Glucose, UA Negative Negative   Bilirubin, UA neg    Ketones, UA neg    Spec Grav, UA 1.015 1.010 - 1.025   Blood, UA neg    pH, UA 6.5 5.0 - 8.0   Protein, UA Negative Negative   Urobilinogen, UA 0.2 0.2 or 1.0 E.U./dL   Nitrite, UA neg    Leukocytes, UA Negative Negative      Assessment & Plan:  Acute right-sided low back pain without sciatica -     POCT Urinalysis Dipstick (Automated) -     Urine Microscopic -     Comprehensive metabolic panel -     CBC with Differential/Platelet -     Urine Culture -     DG Chest 2 View; Future  Hypertensive urgency -     Comprehensive metabolic panel -     DG Chest 2 View; Future  Renal cyst -     Urine Microscopic -     CBC with Differential/Platelet  Tobacco dependence -     DG Chest 2 View; Future  Pt with progressively worsening right sided mid back pain and hypertensive urgency.  Concern patient may have kidney stones versus worsening of renal cyst.  Has CT abdomen pelvis scheduled for 12/02/2023.  Discussed obtaining  labs and chest x-ray to evaluate current symptoms.  Patient also under a pain contract with Ortho.  Increase p.o. intake of water and fluids.  For uncontrollable symptoms proceed to nearest ED.  Return if symptoms worsen or fail to improve.   Deeann Saint, MD

## 2023-11-28 DIAGNOSIS — M170 Bilateral primary osteoarthritis of knee: Secondary | ICD-10-CM | POA: Diagnosis not present

## 2023-11-28 LAB — URINE CULTURE
MICRO NUMBER:: 16226604
Result:: NO GROWTH
SPECIMEN QUALITY:: ADEQUATE

## 2023-12-01 ENCOUNTER — Other Ambulatory Visit: Payer: Self-pay | Admitting: Family Medicine

## 2023-12-01 ENCOUNTER — Ambulatory Visit: Payer: 59 | Admitting: Physical Therapy

## 2023-12-01 ENCOUNTER — Encounter: Payer: Self-pay | Admitting: Family Medicine

## 2023-12-01 ENCOUNTER — Encounter: Payer: Self-pay | Admitting: Physical Therapy

## 2023-12-01 DIAGNOSIS — M5459 Other low back pain: Secondary | ICD-10-CM | POA: Diagnosis not present

## 2023-12-01 DIAGNOSIS — R29898 Other symptoms and signs involving the musculoskeletal system: Secondary | ICD-10-CM | POA: Diagnosis not present

## 2023-12-01 DIAGNOSIS — M6281 Muscle weakness (generalized): Secondary | ICD-10-CM

## 2023-12-01 NOTE — Therapy (Signed)
 OUTPATIENT PHYSICAL THERAPY THORACOLUMBAR TREATMENT    Patient Name: [name] [name] MRN: [id number] DOB:07-06-1951, 73 y.o., male Today's Date: 12/01/2023  END OF SESSION:  PT End of Session - 12/01/23 1409     Visit Number 3    Number of Visits --    Date for PT Re-Evaluation 12/01/23    Authorization Type [employer] Dual    Authorization Time Period 11/03/23 to 12/01/23    Progress Note Due on Visit 10    PT Start Time 1353   pt few minutes late to visit   PT Stop Time 1428    PT Time Calculation (min) 35 min    Activity Tolerance Patient tolerated treatment well    Behavior During Therapy [facility] for tasks assessed/performed               Past Medical History:  Diagnosis Date   Anxiety and depression    Back pain    Diverticulitis    Diverticulosis    DVT (deep venous thrombosis) (HCC)    Gout    Hernia    History of blood clots    Hypertension    Lesion of right native kidney 01/03/2023   Nausea and vomiting    Neuropathy, peripheral 05/15/2012   patient denies   Shingles    Past Surgical History:  Procedure Laterality Date   APPENDECTOMY     BACK SURGERY     CHOLECYSTECTOMY  09/09/2006   HERNIA REPAIR     HIATAL HERNIA REPAIR     IR ANGIO INTRA EXTRACRAN SEL INTERNAL CAROTID BILAT MOD SED  01/18/2020   IR ANGIO VERTEBRAL SEL VERTEBRAL BILAT MOD SED  01/18/2020   IR US GUIDE VASC ACCESS RIGHT  01/18/2020   LEFT HEART CATHETERIZATION WITH CORONARY ANGIOGRAM N/A 08/02/2014   Procedure: LEFT HEART CATHETERIZATION WITH CORONARY ANGIOGRAM;  Surgeon: Pamella Pert, MD;  Location: [facility] CATH LAB;  Service: Cardiovascular;  Laterality: N/A;   MULTIPLE EXTRACTIONS WITH ALVEOLOPLASTY N/A 10/19/2015   Procedure: Extraction of tooth #'s 2,12,13 with alveoloplasty;  Surgeon: Charlynne Pander, DDS;  Location: [facility] OR;  Service: Oral Surgery;  Laterality: N/A;   SACRAL NERVE STIMULATOR PLACEMENT  09/09/2009   Medtronic RestoreADVANCED [phone] MRI  info-[email or web address]   Patient Active Problem List   Diagnosis Date Noted   Pulmonary emphysema (HCC) 08/20/2023   Vitamin D deficiency 08/20/2023   Iron deficiency anemia secondary to inadequate dietary iron intake 08/20/2023   Right hip pain 05/15/2023   Tobacco dependence 05/15/2023   Lumbar radiculopathy 03/11/2023   Spinal stenosis of lumbar region 02/28/2023   HTN (hypertension), benign 02/28/2023   History of DVT (deep vein thrombosis) 02/28/2023   Hyperlipidemia 02/28/2023   Depression with anxiety 02/28/2023   Microcytic anemia 02/28/2023   Renal mass; right 01/08/2023   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   BPH with obstruction/lower urinary tract symptoms 01/08/2023   Chronic prostatitis 01/08/2023   Lesion of right native kidney 01/03/2023   Tick bite of right thigh 01/03/2023   Moderate episode of recurrent major depressive disorder (HCC) 01/03/2023   Right flank pain 05/01/2021   Chronic right-sided back pain 05/01/2021   Chronic diarrhea 05/01/2021   Primary osteoarthritis of left knee 03/29/2020   Primary osteoarthritis of right knee 07/08/2017   Complete rotator cuff tear of left shoulder 10/21/2016   Gout attack 04/02/2016   Angina pectoris (HCC) 08/01/2014   Hemorrhage of rectum and anus 03/28/2014   Neuropathy, peripheral 05/15/2012   Pulmonary embolism (HCC) 07/11/2011  DVT (deep venous thrombosis) (HCC) 07/11/2011   Abdominal pain, left lower quadrant 01/29/2011   Long term current use of anticoagulant therapy 01/29/2011   Diarrhea 01/29/2011    PCP: Nira Conn MD   REFERRING PROVIDER: Lavone Nian, PA-C  REFERRING DIAG: Fusion of Spine,lumbar region  Rationale for Evaluation and Treatment: Rehabilitation  THERAPY DIAG:  Other low back pain  Muscle weakness (generalized)  Other symptoms and signs involving the musculoskeletal system  ONSET DATE: L3-L5 laminectomy and fusion  with TLIF 03/18/23 (revision)              SUBJECTIVE:                                                                                                                                                                               SUBJECTIVE STATEMENT:  Had a lot of fluid taken out of my knees  (2 tubes from the left, 1 from the right) recently but they still hurt. Had first dose of silicon for my knees too, getting another one Friday. When they took the nerve stimulator out of my back is when my back started bothering me. As long as I don't have my belt on I'm fine, it irritates the heck out of the area and can do what I want. The more I do the more I feel stuck in that same stage. My wife had surgery, other family members are sick and can't help. We do have home health helping [place] out. I want to go back to work, if [employer] hurt I want to hurt making me a dollar.  PERTINENT HISTORY:  See above   PAIN:  Are you having pain? Yes: NPRS scale: 6-7/10 Pain location: belt line down through R leg down to toes  Pain description: very sensitive, can't wear belt its so sore, aggravating pain that makes me angry  Aggravating factors: bending and walking  Relieving factors: pain meds, heat   PRECAUTIONS: Back and Other: on chronic Xarelto for life   RED FLAGS: None   WEIGHT BEARING RESTRICTIONS: No  FALLS:  Has patient fallen in last 6 months? Yes. 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   PATIENT SURVEYS:   Modified Oswestry  31/50   COGNITION: Overall cognitive status: Within functional limits for tasks assessed       MUSCLE LENGTH:  Noted severe limitations in hip mm flexibility, HS, hip flexors per skilled observation  POSTURE: rounded shoulders, forward head, increased thoracic kyphosis, and s/p multiple lx surgeries   PALPATION:   R lumbar paraspinals very tight and tender, R glute/piriformis   LUMBAR ROM:   AROM eval  Flexion Moderate limitation   Extension Not even able to get to neutral   Right lateral flexion Severe limitation   Left lateral flexion Severe limitation   Right rotation   Left rotation    (Blank rows = not tested)    LOWER EXTREMITY MMT:    MMT Right eval Left eval  Hip flexion 3+ pain in groin  5  Hip extension    Hip abduction 2+ 2+  Hip adduction    Hip internal rotation    Hip external rotation    Knee flexion 4 5  Knee extension 5 5  Ankle dorsiflexion 5 5  Ankle plantarflexion    Ankle inversion    Ankle eversion     (Blank rows = not tested)    TREATMENT DATE:   12/01/23   Nustep L5x6 minutes BLEs only Hooklying TA set 15x3 second holds PPT 10x3 seconds  HS stretches 2x30 seconds B hooklying with strap        11/10/23:  Supine with moist heat under back for therex:  Legs on 75 cm ball for gentle low ampitude LTR " Low amplitude B knee to chest  Supine B knees extended , glut sets Supine B knees extended , heel slides, 10 x each leg Supine B knees extended, hips IR, isometric IR squeeze 5 sec holds 10 x Supine B knees extended, hips ER, isometric heel squeezes Seated B hip abd/ ER blue t band 10 reps Seated long arc quads no resistance, alternating. Advised pt to pursue lifts under his sofa to elevate 2 -3 inches due to his height and difficulty moving sit to stand   11/03/23  Eval, POC, HEP  MHP x10 minutes to l-spine in supported sitting                                                                                                                                     PATIENT EDUCATION:  Education details: exam findings, POC, HEP, benefits of moist vs dry heat  Person educated: Patient Education method: Programmer, multimedia, Demonstration, Verbal cues, and Handouts Education comprehension: verbalized understanding, returned demonstration, verbal cues required, and needs further education  HOME EXERCISE PROGRAM:  Access Code: [email or web address] URL: [email or web address] Date: 12/01/2023 Prepared by: Nedra Hai  Exercises - Supine Transversus Abdominis Bracing - Hands on Stomach  - 2-3 x daily - 7 x weekly - 1 sets - 10 reps - 3 seconds  hold - Seated Sidebending  - 2-3 x daily - 7 x weekly -  1 sets - 10 reps - 5 seconds  hold - Supine Posterior Pelvic Tilt  - 2-3 x daily - 7 x weekly - 1 sets - 10 reps - 3 seconds  hold - Hooklying Hamstring Stretch with Strap  - 2-3 x daily - 7 x weekly - 1 sets - 2-3 reps - 30 seconds  hold     ASSESSMENT:  CLINICAL IMPRESSION:   Pt arrived a few minutes late for this appointment. Per chart review, he has had many multiple cancels and no-shows with physical therapy- did have a death in the family recently, but otherwise reason for cancels/no shows is a bit unclear. Lots of education given today on role of core strengthening in improving his pain levels, also on how TENS works but how other interventions are still needed to keep addressing pain. Worked on core strengthening and updated HEP, since he continues to feel worse I would like him to see his back doctor prior to returning to PT. Progress with our services has been very limited due to poor attendance, which is understandable given multiple family health situations- may need to consider focusing on PT at another time when he is able to come more regularly.          OBJECTIVE IMPAIRMENTS: Abnormal gait, decreased mobility, difficulty walking, decreased ROM, decreased strength, increased fascial  restrictions, impaired perceived functional ability, increased muscle spasms, impaired flexibility, impaired sensation, obesity, and pain.   ACTIVITY LIMITATIONS: carrying, lifting, bending, sitting, standing, squatting, sleeping, stairs, transfers, bed mobility, reach over head, hygiene/grooming, and locomotion level  PARTICIPATION LIMITATIONS: meal prep, cleaning, laundry, driving, shopping, community activity, yard work, and church  PERSONAL FACTORS: Age, Behavior pattern, Fitness, Past/current experiences, and Time since onset of injury/illness/exacerbation are also affecting patient's functional outcome.   REHAB POTENTIAL: Fair chronicity of back issues/multiple surgeries   CLINICAL DECISION MAKING: Evolving/moderate complexity  EVALUATION COMPLEXITY: Moderate   GOALS: Goals reviewed with patient? Yes  SHORT TERM GOALS: Target date: 11/17/2023    Will be compliant with appropriate progressive HEP GOAL STATUS: ONGOING 12/01/23   2. Will demonstrate improved postural awareness with all functional tasks, use of ergonomic aides PRN/as desired GOAL STATUS: ONGOING 12/01/23   3. Will demonstrate good functional biomechanics for bed mobility and floor to waist lifting mechanics   GOAL STATUS: ONGOING 12/01/23  4. Lumbar ROM to be no more than 50% limited all planes GOAL STATUS: ONGOING 12/01/23  LONG TERM GOALS: Target date: 12/01/2023    MMT to have improved by one grade all weak groups GOAL STATUS: ONGOING 12/01/23  2. Mm flexibility and spasms to have improved by at least 50% in order to improve functional movement patterns and for pain control GOAL STATUS: ONGOING 12/01/23  3. Pain to be no more than 6/10 with all functional activities GOAL STATUS: ONGOING 12/01/23   4. Will have improved score on Modified Oswestry by at least 10 points to show improved QOL and subjective perception of condition  GOAL STATUS: ONGOING 12/01/23   PLAN:  PT FREQUENCY: 2x/week  PT  DURATION: 4 weeks  PLANNED INTERVENTIONS: 97164- PT Re-evaluation, 97110-Therapeutic exercises, 97530- Therapeutic activity, 97112- Neuromuscular re-education, 97535- Self Care, 16109- Manual therapy, [id number]- Aquatic Therapy, U0454- Electrical stimulation (unattended), 09811- Traction (mechanical), and Dry Needling.  PLAN FOR NEXT SESSION: what did back MD say?   Nedra Hai, PT, DPT 12/01/23 2:35 PM

## 2023-12-02 ENCOUNTER — Ambulatory Visit
Admission: RE | Admit: 2023-12-02 | Discharge: 2023-12-02 | Disposition: A | Discharge status: Home / Self Care | Source: Ambulatory Visit | Attending: Family Medicine

## 2023-12-02 ENCOUNTER — Telehealth: Payer: Self-pay | Admitting: Family Medicine

## 2023-12-02 DIAGNOSIS — D7389 Other diseases of spleen: Secondary | ICD-10-CM | POA: Diagnosis not present

## 2023-12-02 DIAGNOSIS — I700 Atherosclerosis of aorta: Secondary | ICD-10-CM | POA: Diagnosis not present

## 2023-12-02 DIAGNOSIS — K5730 Diverticulosis of large intestine without perforation or abscess without bleeding: Secondary | ICD-10-CM | POA: Diagnosis not present

## 2023-12-02 NOTE — Telephone Encounter (Signed)
 We need more details about if his blood pressure is high or low/pt may need to speak to the triage nurse and/or make a sooner appt with a different provider

## 2023-12-03 ENCOUNTER — Ambulatory Visit: Payer: 59 | Admitting: Physical Therapy

## 2023-12-03 ENCOUNTER — Other Ambulatory Visit (INDEPENDENT_AMBULATORY_CARE_PROVIDER_SITE_OTHER)

## 2023-12-03 ENCOUNTER — Encounter: Payer: Self-pay | Admitting: Family Medicine

## 2023-12-03 VITALS — BP 146/76

## 2023-12-03 DIAGNOSIS — I10 Essential (primary) hypertension: Secondary | ICD-10-CM

## 2023-12-03 NOTE — Progress Notes (Signed)
 12/03/2023 Name: [name] [name] MRN: [id number] DOB: Jul 02, 1951  Chief Complaint  Patient presents with   Hypertension   Medication Management    HASHEEM VOLAND is a 73 y.o. year old male who was referred for medication management by their primary care provider, [name] Georges, MD. They presented for a face to face visit today.   They were referred to the pharmacist by their PCP for assistance in managing complex medication management and hypertension.   Subjective:  Care Team: Primary Care Provider: [name], MD ; Next Scheduled Visit: 12/09/23  Medication Access/Adherence  Current Pharmacy:  [id number] [place] Otto, [place] - [address] AT [facility] OF [place] [address] [place] [address] Phone: [phone] Fax: [phone]   Patient reports affordability concerns with their medications: No  Patient reports access/transportation concerns to their pharmacy: No  Patient reports adherence concerns with their medications:  Yes  -confused, unsure what he is taking, has NUMEROUS expired bottles at past appts and various duplicate therapy medications (pantoprazole vs omeprazole; alfuzosin vs flomax) and had a bottle containing an assortment of tablets that he thought were vitamins (contained lipitor, bactrim, and methocarbamol) - recommended pill packaging at last appt.   Hypertension:  Current medications: Olmesartan 20mg  daily, hydrochlorothiazide 25mg  daily, Nifedipine XL 60mg  daily Medications previously tried: Lisinopril, Valsartan, Benazepril, Metoprolol, Lasix  Patient has a validated, automated, upper arm home BP cuff Current blood pressure readings readings: checking daily, reports 146/76 now, has come down from previous HTN episode he reports he had a home the day prior with SBP in the 200s  Denies any s/sx of low/high BP   Medication Management:  Current adherence strategy: Has been in process of  establishing pill packs with Divvydose per patient report. I recommended local pill packaging that I can assist with but patient declines and insists on using divvydose through his ins company, as this is who his wife uses also  Patient does report he is using the daily pill sorter provided at last in office visit and that the nursing aide his wife has coming to home at least once weekly is making it for him based on list I provided.   Patient denies skipping any doses of meds in pill box.    Objective:  Lab Results  Component Value Date   HGBA1C 5.5 03/11/2023    Lab Results  Component Value Date   CREATININE 0.80 11/27/2023   BUN 7 11/27/2023   NA 141 11/27/2023   K 4.1 11/27/2023   CL 99 11/27/2023   CO2 34 (H) 11/27/2023    No results found for: "CHOL", "HDL", "LDLCALC", "LDLDIRECT", "TRIG", "CHOLHDL"  Medications Reviewed Today     Reviewed by Sherrill Raring, RPH (Pharmacist) on 12/03/23 at 0909  Med List Status: <None>   Medication Order Taking? Sig Documenting Provider Last Dose Status Informant  albuterol (PROVENTIL HFA;VENTOLIN HFA) 108 (90 Base) MCG/ACT inhaler [id number] Yes Inhale 2 puffs into the lungs every 6 (six) hours as needed for wheezing or shortness of breath. Derwood Kaplan, MD Taking Active Self  albuterol (PROVENTIL) (2.5 MG/3ML) 0.083% nebulizer solution [id number]  Take 2.5 mg by nebulization every 6 (six) hours as needed for wheezing or shortness of breath. [provider]  Active Self  alfuzosin (UROXATRAL) 10 MG 24 hr tablet [id number] Yes Take 1 tablet (10 mg total) by mouth daily with breakfast. Stoneking, Danford Bad., MD Taking Active  ARIPiprazole (ABILIFY) 10 MG tablet [id number] Yes Take 1 tablet (10 mg total) by mouth daily. [name] Georges, MD Taking Active   atorvastatin (LIPITOR) 20 MG tablet [id number] Yes Take 20 mg by mouth daily. [provider] Taking Active            Med Note Leitha Bleak, Milas Kocher   Wed Sep 17, 2023   7:49 PM) Not taking, restarting today  Budeson-Glycopyrrol-Formoterol (BREZTRI AEROSPHERE) 160-9-4.8 MCG/ACT AERO [id number] Yes Inhale 2 puffs into the lungs 2 (two) times daily. [name] Georges, MD Taking Active   cyclobenzaprine (FLEXERIL) 10 MG tablet [id number] Yes Take 1 tablet (10 mg total) by mouth 3 (three) times daily as needed for muscle spasms. [name] Georges, MD Taking Active   DULoxetine (CYMBALTA) 20 MG capsule [id number] Yes Take 1 capsule (20 mg total) by mouth daily. [name] Georges, MD Taking Active   DULoxetine (CYMBALTA) 60 MG capsule [id number] Yes Take 1 capsule (60 mg total) by mouth daily. [name] Georges, MD Taking Active   hydrochlorothiazide (HYDRODIURIL) 25 MG tablet [id number] Yes Take 1 tablet (25 mg total) by mouth daily. [name] Georges, MD Taking Active   lubiprostone [facility]) 24 MCG capsule [id number] Yes Take 24 mcg by mouth 2 (two) times daily with a meal. [provider] Taking Active   montelukast (SINGULAIR) 10 MG tablet [id number] Yes Take 10 mg by mouth at bedtime. [provider] Taking Active   nicotine (NICODERM CQ - DOSED IN MG/24 HOURS) 14 mg/24hr patch [id number]  Place 1 patch (14 mg total) onto the skin daily. [name] Georges, MD  Active   nicotine (NICODERM CQ - DOSED IN MG/24 HOURS) 21 mg/24hr patch [id number]  Place 1 patch (21 mg total) onto the skin daily. [name] Georges, MD  Active   nicotine (NICODERM CQ - DOSED IN MG/24 HR) 7 mg/24hr patch [id number]  Place 1 patch (7 mg total) onto the skin daily. [name] Georges, MD  Active   NIFEdipine (PROCARDIA XL/NIFEDICAL XL) 60 MG 24 hr tablet [id number] Yes Take 2 tablets (120 mg total) by mouth daily. [name] Georges, MD Taking Active   olmesartan [facility]) 20 MG tablet [id number] Yes Take 1 tablet (20 mg total) by mouth daily. [name] Georges, MD Taking Active   Oxycodone HCl 10 MG TABS [id number] Yes Take 10 mg by mouth every 8 (eight) hours as needed. [provider] Taking Active            Med Note Leitha Bleak, Milas Kocher   Wed Sep 17, 2023  7:51 PM) Oxycodone 15mg   pantoprazole (PROTONIX) 40 MG tablet [id number] Yes Take 40 mg by mouth 2 (two) times daily. [provider] Taking Active   rivaroxaban (XARELTO) 20 MG TABS tablet [id number] Yes Take 1 tablet (20 mg total) by mouth daily with supper. Please follow up with Primary Care MD for future refills Johney Maine, MD Taking Active Self              Assessment/Plan:   Hypertension: - Currently uncontrolled - Reviewed long term cardiovascular and renal outcomes of uncontrolled blood pressure - Reviewed appropriate blood pressure monitoring technique and reviewed goal blood pressure. Recommended to check home blood pressure and heart rate daily and keep a log -Reminded of upcoming cardio/PCP appts and stressed the importance of keeping. Would recommend increasing Olmesartan to 40mg  if BP remains elevated at cardio visit on 3/31.   Medication Management: - Currently strategy insufficient to maintain  appropriate adherence to prescribed medication regimen as nurse aide will not be continuing to come to house forever - Suggested use of pill packaging through [facility] or local pharmacy. Patient again wants to use divvydose and plans on calling them back today to follow back up. They will not talk to me without patient also on the line and patient cannot stay on the phone this morning with me to do that. States he will call himself today with wife to assist.    Follow Up Plan: 1 month (sees PCP and new cardiologist in between)  Sherrill Raring, PharmD Clinical Pharmacist [phone]

## 2023-12-05 DIAGNOSIS — M170 Bilateral primary osteoarthritis of knee: Secondary | ICD-10-CM | POA: Diagnosis not present

## 2023-12-06 ENCOUNTER — Encounter: Payer: Self-pay | Admitting: Family Medicine

## 2023-12-08 ENCOUNTER — Ambulatory Visit (INDEPENDENT_AMBULATORY_CARE_PROVIDER_SITE_OTHER): Payer: 59 | Admitting: Cardiology

## 2023-12-08 ENCOUNTER — Encounter (HOSPITAL_BASED_OUTPATIENT_CLINIC_OR_DEPARTMENT_OTHER): Payer: Self-pay | Admitting: Cardiology

## 2023-12-08 VITALS — BP 164/90 | HR 106 | Ht 76.0 in | Wt 226.5 lb

## 2023-12-08 DIAGNOSIS — I10 Essential (primary) hypertension: Secondary | ICD-10-CM | POA: Diagnosis not present

## 2023-12-08 DIAGNOSIS — I82503 Chronic embolism and thrombosis of unspecified deep veins of lower extremity, bilateral: Secondary | ICD-10-CM | POA: Diagnosis not present

## 2023-12-08 DIAGNOSIS — N529 Male erectile dysfunction, unspecified: Secondary | ICD-10-CM | POA: Diagnosis not present

## 2023-12-08 MED ORDER — OLMESARTAN MEDOXOMIL 40 MG PO TABS: 40.0000 mg | ORAL_TABLET | Freq: Every day | ORAL | 3 refills | Status: DC

## 2023-12-08 MED ORDER — SPIRONOLACTONE 25 MG PO TABS
12.5000 mg | ORAL_TABLET | Freq: Every day | ORAL | 3 refills | Status: DC
Start: 2023-12-08 — End: 2023-12-09

## 2023-12-08 NOTE — Progress Notes (Signed)
 Cardiology Office Note:  .   Date:  12/08/2023  ID:  AARIV MEDLOCK, DOB [birth year], MRN [id number] PCP: [name], MD  [facility] Providers Cardiologist:  None    History of Present Illness: .   [name] [name] is a 73 y.o. male Discussed the use of AI scribe software for clinical note transcription with the patient, who gave verbal consent to proceed.  History of Present Illness [name] [name] is a 73 year old male with hypertension and leg swelling who presents for evaluation consultation.  He has hypertension with home blood pressure readings generally around 140/72 mmHg, though he can be lower when he is not in pain. His current antihypertensive regimen includes olmesartan 20 mg daily, hydrochlorothiazide 25 mg daily, and nifedipine XL 60 mg daily. He has previously tried lisinopril, valsartan, benazepril, metoprolol, and Lasix. There is concern about medication adherence due to confusion with his regimen, evidenced by expired bottles and duplicate therapies.  He has a history of leg swelling related to previous blood clots and is currently managed with Xarelto, which has been effective in preventing further clots. He was previously on warfarin and had multiple hospitalizations for blood clots before switching to Xarelto.  A CT scan has shown aortic atherosclerosis, contributing to his cardiovascular risk profile.  Chronic back pain persists despite previous interventions, including the removal of a nerve stimulant from his back. This pain is affecting his quality of life and may contribute to elevated blood pressure.  He currently smokes and finds it challenging to quit, although he reports smoking less than before. He is experiencing significant family stress, including his wife's recent surgery and his daughter's health issues, which he feels contribute to his overall stress and health challenges.     Studies Reviewed: .        Results LABS Creatinine: 0.8  mg/dL Potassium: 4.1 mEq/L  RADIOLOGY CT scan: Aortic atherosclerosis Risk Assessment/Calculations:           Physical Exam:   VS:  BP (!) 164/90   Pulse (!) 106   Ht 6\' 4"  (1.93 m)   Wt 226 lb 8 oz (102.7 kg)   SpO2 97%   BMI 27.57 kg/m    Wt Readings from Last 3 Encounters:  12/08/23 226 lb 8 oz (102.7 kg)  11/27/23 231 lb 6.4 oz (105 kg)  10/16/23 230 lb 1.6 oz (104.4 kg)    GEN: Well nourished, well developed in no acute distress NECK: No JVD; No carotid bruits CARDIAC: RRR, no murmurs, no rubs, no gallops RESPIRATORY:  Clear to auscultation without rales, wheezing or rhonchi  ABDOMEN: Soft, non-tender, non-distended EXTREMITIES:  No edema; No deformity   ASSESSMENT AND PLAN: .    Assessment and Plan Assessment & Plan Hypertension Hypertension with suboptimal control, likely due to medication non-adherence. Current medications include olmesartan 20 mg daily, hydrochlorothiazide 25 mg daily, and nifedipine XL 60 mg daily. Home blood pressure readings are sometimes well-controlled at 140/72 mmHg, but in-office reading was 164/90 mmHg. Pain, particularly back pain, may contribute to elevated blood pressure. - Increase olmesartan to 40 mg daily. - Initiate spironolactone 12.5 mg daily with monitoring of potassium and creatinine levels. - Primary care physician to continue managing antihypertensive regimen.  Back pain Chronic back pain, possibly related to previous nerve stimulant removal, may contribute to elevated blood pressure. Effective pain management is crucial for better blood pressure control. - Manage pain to aid in blood pressure control.  Aortic atherosclerosis Aortic  atherosclerosis identified on CT scan without specific symptoms or complications.  Deep vein thrombosis (DVT) DVT managed with Xarelto, effective in preventing further thrombotic events. Previous treatment with warfarin required hospitalization for blood clots.  Tobacco use Current smoker  with attempts to reduce smoking. He acknowledges difficulty in quitting due to personal stressors, including family health issues. - Encourage continued efforts to reduce smoking and consider cessation strategies when ready.  Erectile dysfunction Has seen urology in the past for this.  No success with sildenafil.  We will see back as needed        Signed, Donato Schultz, MD

## 2023-12-08 NOTE — Patient Instructions (Signed)
 Medication Instructions:  Please start Spironolactone 12.5 mg (1/2 of 25 mg tablet) daily. Increase Olmesartan to 40 mg a day. Continue all other medications as listed.  *If you need a refill on your cardiac medications before your next appointment, please call your pharmacy*  Follow-Up: At [facility], you and your health needs are our priority.  As part of our continuing mission to provide you with exceptional heart care, our providers are all part of one team.  This team includes your primary Cardiologist (physician) and Advanced Practice Providers or APPs (Physician Assistants and Nurse Practitioners) who all work together to provide you with the care you need, when you need it.  Your next appointment:   Follow up as needed with Dr Anne Fu  We recommend signing up for the patient portal called "MyChart".  Sign up information is provided on this After Visit Summary.  MyChart is used to connect with patients for Virtual Visits (Telemedicine).  Patients are able to view lab/test results, encounter notes, upcoming appointments, etc.  Non-urgent messages can be sent to your provider as well.   To learn more about what you can do with MyChart, go to [email or web address].

## 2023-12-09 ENCOUNTER — Ambulatory Visit (INDEPENDENT_AMBULATORY_CARE_PROVIDER_SITE_OTHER): Admitting: Family Medicine

## 2023-12-09 ENCOUNTER — Encounter: Payer: Self-pay | Admitting: Family Medicine

## 2023-12-09 ENCOUNTER — Other Ambulatory Visit (HOSPITAL_COMMUNITY): Payer: Self-pay

## 2023-12-09 VITALS — BP 160/100 | HR 75 | Temp 97.8°F | Ht 76.0 in | Wt 231.5 lb

## 2023-12-09 DIAGNOSIS — I10 Essential (primary) hypertension: Secondary | ICD-10-CM | POA: Diagnosis not present

## 2023-12-09 DIAGNOSIS — K219 Gastro-esophageal reflux disease without esophagitis: Secondary | ICD-10-CM

## 2023-12-09 DIAGNOSIS — F331 Major depressive disorder, recurrent, moderate: Secondary | ICD-10-CM

## 2023-12-09 DIAGNOSIS — E782 Mixed hyperlipidemia: Secondary | ICD-10-CM | POA: Diagnosis not present

## 2023-12-09 DIAGNOSIS — N401 Enlarged prostate with lower urinary tract symptoms: Secondary | ICD-10-CM

## 2023-12-09 DIAGNOSIS — I82401 Acute embolism and thrombosis of unspecified deep veins of right lower extremity: Secondary | ICD-10-CM

## 2023-12-09 DIAGNOSIS — N138 Other obstructive and reflux uropathy: Secondary | ICD-10-CM

## 2023-12-09 DIAGNOSIS — Z7901 Long term (current) use of anticoagulants: Secondary | ICD-10-CM

## 2023-12-09 MED ORDER — OLMESARTAN MEDOXOMIL 40 MG PO TABS
40.0000 mg | ORAL_TABLET | Freq: Every day | ORAL | 3 refills | Status: AC
  Filled 2023-12-09 – 2023-12-11 (×2): qty 30, 30d supply, fill #0
  Filled 2023-12-15 – 2023-12-29 (×2): qty 90, 90d supply, fill #0
  Filled 2024-01-06: qty 30, 30d supply, fill #0
  Filled 2024-01-27: qty 90, 90d supply, fill #0
  Filled 2024-02-03 – 2024-02-17 (×2): qty 30, 30d supply, fill #0
  Filled 2024-03-08 – 2024-03-11 (×3): qty 30, 30d supply, fill #1
  Filled 2024-04-15: qty 30, 30d supply, fill #2
  Filled 2024-05-12: qty 30, 30d supply, fill #3
  Filled 2024-06-10: qty 30, 30d supply, fill #4
  Filled 2024-07-06: qty 30, 30d supply, fill #5
  Filled 2024-07-28 – 2024-07-29 (×2): qty 30, 30d supply, fill #6
  Filled 2024-09-13: qty 30, 30d supply, fill #7
  Filled 2024-10-12: qty 30, 30d supply, fill #8

## 2023-12-09 MED ORDER — ATORVASTATIN CALCIUM 20 MG PO TABS
20.0000 mg | ORAL_TABLET | Freq: Every morning | ORAL | 3 refills | Status: AC
  Filled 2023-12-09 – 2023-12-11 (×2): qty 30, 30d supply, fill #0
  Filled 2023-12-15: qty 90, 90d supply, fill #0
  Filled 2023-12-16: qty 30, 30d supply, fill #0
  Filled 2023-12-29 – 2024-01-08 (×3): qty 30, 30d supply, fill #1
  Filled 2024-01-27 – 2024-02-17 (×3): qty 30, 30d supply, fill #2
  Filled 2024-03-08 – 2024-03-11 (×3): qty 30, 30d supply, fill #3
  Filled 2024-04-15: qty 30, 30d supply, fill #4
  Filled 2024-05-12: qty 30, 30d supply, fill #5
  Filled 2024-06-10: qty 30, 30d supply, fill #6
  Filled 2024-07-06: qty 30, 30d supply, fill #7
  Filled 2024-07-28 – 2024-07-29 (×2): qty 30, 30d supply, fill #8
  Filled 2024-09-13: qty 30, 30d supply, fill #9
  Filled 2024-10-12: qty 30, 30d supply, fill #10

## 2023-12-09 MED ORDER — SPIRONOLACTONE 25 MG PO TABS
12.5000 mg | ORAL_TABLET | Freq: Every morning | ORAL | 3 refills | Status: AC
  Filled 2023-12-09: qty 15, 30d supply, fill #0
  Filled 2023-12-11 – 2024-02-16 (×7): qty 45, 90d supply, fill #0
  Filled 2024-05-12: qty 45, 90d supply, fill #1
  Filled 2024-07-28 – 2024-07-29 (×2): qty 45, 90d supply, fill #2

## 2023-12-09 MED ORDER — DULOXETINE HCL 20 MG PO CPEP
20.0000 mg | ORAL_CAPSULE | Freq: Every morning | ORAL | 3 refills | Status: AC
  Filled 2023-12-09 – 2023-12-11 (×2): qty 30, 30d supply, fill #0
  Filled 2023-12-15: qty 90, 90d supply, fill #0
  Filled 2023-12-16: qty 30, 30d supply, fill #0
  Filled 2023-12-29 – 2024-01-08 (×3): qty 30, 30d supply, fill #1
  Filled 2024-01-27 – 2024-02-17 (×3): qty 30, 30d supply, fill #2
  Filled 2024-03-08 – 2024-03-11 (×3): qty 30, 30d supply, fill #3
  Filled 2024-04-15: qty 30, 30d supply, fill #4
  Filled 2024-05-12: qty 30, 30d supply, fill #5
  Filled 2024-06-10: qty 30, 30d supply, fill #6
  Filled 2024-07-06: qty 30, 30d supply, fill #7
  Filled 2024-07-28 – 2024-07-29 (×2): qty 30, 30d supply, fill #8
  Filled 2024-09-13: qty 30, 30d supply, fill #9
  Filled 2024-10-12: qty 30, 30d supply, fill #10

## 2023-12-09 MED ORDER — ARIPIPRAZOLE 10 MG PO TABS
10.0000 mg | ORAL_TABLET | Freq: Every day | ORAL | 3 refills | Status: DC
  Filled 2023-12-09 – 2023-12-11 (×2): qty 30, 30d supply, fill #0
  Filled 2023-12-15 – 2023-12-29 (×2): qty 90, 90d supply, fill #0
  Filled 2024-01-06: qty 30, 30d supply, fill #0
  Filled 2024-01-27: qty 90, 90d supply, fill #0
  Filled 2024-02-03 – 2024-02-17 (×2): qty 30, 30d supply, fill #0

## 2023-12-09 MED ORDER — PANTOPRAZOLE SODIUM 40 MG PO TBEC
40.0000 mg | DELAYED_RELEASE_TABLET | Freq: Every morning | ORAL | 1 refills | Status: DC
  Filled 2023-12-09 – 2023-12-11 (×2): qty 30, 30d supply, fill #0
  Filled 2023-12-15: qty 90, 90d supply, fill #0
  Filled 2023-12-16: qty 30, 30d supply, fill #0
  Filled 2023-12-29 – 2024-01-08 (×3): qty 30, 30d supply, fill #1
  Filled 2024-01-27 – 2024-02-17 (×3): qty 30, 30d supply, fill #2
  Filled 2024-03-08 – 2024-03-11 (×3): qty 30, 30d supply, fill #3
  Filled 2024-04-15: qty 30, 30d supply, fill #4
  Filled 2024-05-12: qty 30, 30d supply, fill #5

## 2023-12-09 MED ORDER — NIFEDIPINE ER OSMOTIC RELEASE 60 MG PO TB24
120.0000 mg | ORAL_TABLET | Freq: Every day | ORAL | 3 refills | Status: AC
  Filled 2023-12-09 – 2023-12-11 (×2): qty 60, 30d supply, fill #0
  Filled 2023-12-15 – 2023-12-29 (×2): qty 180, 90d supply, fill #0
  Filled 2024-01-06 – 2024-01-08 (×2): qty 60, 30d supply, fill #0
  Filled 2024-01-27 – 2024-04-15 (×6): qty 60, 30d supply, fill #1
  Filled 2024-05-12: qty 60, 30d supply, fill #2
  Filled 2024-06-10: qty 60, 30d supply, fill #3
  Filled 2024-07-06: qty 60, 30d supply, fill #4
  Filled 2024-07-28 – 2024-07-29 (×2): qty 60, 30d supply, fill #5
  Filled 2024-09-13: qty 60, 30d supply, fill #6
  Filled 2024-10-12: qty 60, 30d supply, fill #7

## 2023-12-09 MED ORDER — DULOXETINE HCL 60 MG PO CPEP
60.0000 mg | ORAL_CAPSULE | Freq: Every morning | ORAL | 1 refills | Status: DC
  Filled 2023-12-09 – 2023-12-11 (×2): qty 30, 30d supply, fill #0
  Filled 2023-12-15: qty 90, 90d supply, fill #0
  Filled 2023-12-16: qty 30, 30d supply, fill #0
  Filled 2023-12-29 – 2024-01-08 (×3): qty 30, 30d supply, fill #1
  Filled 2024-01-27 – 2024-02-03 (×2): qty 30, 30d supply, fill #2

## 2023-12-09 MED ORDER — ALFUZOSIN HCL ER 10 MG PO TB24
10.0000 mg | ORAL_TABLET | Freq: Every morning | ORAL | 3 refills | Status: DC
  Filled 2023-12-09 – 2023-12-11 (×2): qty 30, 30d supply, fill #0
  Filled 2023-12-15 – 2023-12-29 (×2): qty 90, 90d supply, fill #0
  Filled 2024-01-06: qty 30, 30d supply, fill #0
  Filled 2024-01-27: qty 90, 90d supply, fill #0
  Filled 2024-02-03: qty 30, 30d supply, fill #0
  Filled 2024-03-08 (×2): qty 90, 90d supply, fill #0
  Filled 2024-03-11: qty 30, 30d supply, fill #0
  Filled 2024-04-15 – 2024-05-12 (×3): qty 30, 30d supply, fill #1

## 2023-12-09 MED ORDER — RIVAROXABAN 20 MG PO TABS
20.0000 mg | ORAL_TABLET | Freq: Every day | ORAL | 3 refills | Status: AC
  Filled 2023-12-09 – 2023-12-11 (×2): qty 30, 30d supply, fill #0
  Filled 2023-12-15: qty 90, 90d supply, fill #0
  Filled 2023-12-16: qty 30, 30d supply, fill #0
  Filled 2023-12-29 – 2024-01-08 (×3): qty 30, 30d supply, fill #1
  Filled 2024-01-27 – 2024-02-17 (×3): qty 30, 30d supply, fill #2
  Filled 2024-03-08 – 2024-03-11 (×3): qty 30, 30d supply, fill #3
  Filled 2024-04-15: qty 30, 30d supply, fill #4
  Filled 2024-05-12: qty 30, 30d supply, fill #5
  Filled 2024-06-10: qty 30, 30d supply, fill #6
  Filled 2024-07-06: qty 30, 30d supply, fill #7
  Filled 2024-07-28 – 2024-07-29 (×2): qty 30, 30d supply, fill #8
  Filled 2024-09-13: qty 30, 30d supply, fill #9
  Filled 2024-10-12: qty 30, 30d supply, fill #10

## 2023-12-09 MED ORDER — HYDROCHLOROTHIAZIDE 25 MG PO TABS
25.0000 mg | ORAL_TABLET | Freq: Every morning | ORAL | 1 refills | Status: DC
  Filled 2023-12-09 – 2023-12-11 (×2): qty 30, 30d supply, fill #0
  Filled 2023-12-15: qty 90, 90d supply, fill #0
  Filled 2023-12-16: qty 30, 30d supply, fill #0
  Filled 2023-12-29 – 2024-01-08 (×3): qty 30, 30d supply, fill #1

## 2023-12-09 NOTE — Progress Notes (Unsigned)
 Established Patient Office Visit  Subjective   Patient ID: [name] [name], male    DOB: 05/20/1951  Age: 73 y.o. MRN: [id number]  Chief Complaint  Patient presents with   Hypertension    Patient complains of elevated BP x1 month    Pt is reporting that his BP has been elevated since he is in so much pain -- states he does not think that the 10 mg IR oycodone works as well as the [employer] he was taking before. Pt sees his pain mgt doctor regularly for his Rx's. Patient saw Dr. Anne Fu yesterday -- his olmesartan was increased to 40 mg daily and he was started on spironolactone.     Current Outpatient Medications  Medication Instructions   albuterol (PROVENTIL HFA;VENTOLIN HFA) 108 (90 Base) MCG/ACT inhaler 2 puffs, Inhalation, Every 6 hours PRN   albuterol (PROVENTIL) 2.5 mg, Every 6 hours PRN   alfuzosin (UROXATRAL) 10 mg, Oral, Daily, In the morning   ARIPiprazole (ABILIFY) 10 mg, Oral, Daily at bedtime   atorvastatin (LIPITOR) 20 mg, Oral, Daily, Take in the morning.   Budeson-Glycopyrrol-Formoterol (BREZTRI AEROSPHERE) 160-9-4.8 MCG/ACT AERO 2 puffs, Inhalation, 2 times daily   cyclobenzaprine (FLEXERIL) 10 mg, Oral, 3 times daily PRN   DULoxetine (CYMBALTA) 60 mg, Oral, Daily, To equal 80 mg daily in the morning   DULoxetine (CYMBALTA) 20 mg, Oral, Daily, In the morning.   hydrochlorothiazide (HYDRODIURIL) 25 mg, Oral, Daily, In the morning   nicotine (NICODERM CQ - DOSED IN MG/24 HOURS) 21 mg, Transdermal, Daily   nicotine (NICODERM CQ - DOSED IN MG/24 HOURS) 14 mg, Transdermal, Daily   nicotine (NICODERM CQ - DOSED IN MG/24 HR) 7 mg, Transdermal, Daily   NIFEdipine (PROCARDIA XL/NIFEDICAL XL) 120 mg, Oral, Daily at bedtime   olmesartan (BENICAR) 40 mg, Oral, Daily at bedtime   Oxycodone HCl 10 mg, Every 8 hours PRN   pantoprazole (PROTONIX) 40 mg, Oral, Daily, In the morning   rivaroxaban (XARELTO) 20 mg, Oral, Daily at bedtime   spironolactone (ALDACTONE) 12.5 mg, Oral,  Daily, In the morning    {History (Optional):23778}  Review of Systems  All other systems reviewed and are negative.     Objective:     BP (!) 160/100   Pulse 75   Temp 97.8 F (36.6 C) (Oral)   Ht 6\' 4"  (1.93 m)   Wt 231 lb 8 oz (105 kg)   SpO2 98%   BMI 28.18 kg/m  {Vitals History (Optional):23777}  Physical Exam   No results found for any visits on 12/09/23.  {Labs (Optional):23779}  The ASCVD Risk score (Arnett DK, et al., 2019) failed to calculate for the following reasons:   Cannot find a previous HDL lab   Cannot find a previous total cholesterol lab    Assessment & Plan:  Gastroesophageal reflux disease without esophagitis -     Pantoprazole Sodium; Take 1 tablet (40 mg total) by mouth daily. In the morning  Dispense: 90 tablet; Refill: 1  Moderate episode of recurrent major depressive disorder (HCC) -     DULoxetine HCl; Take 1 capsule (60 mg total) by mouth daily. To equal 80 mg daily in the morning  Dispense: 90 capsule; Refill: 1 -     DULoxetine HCl; Take 1 capsule (20 mg total) by mouth daily. In the morning.  Dispense: 90 capsule; Refill: 3 -     ARIPiprazole; Take 1 tablet (10 mg total) by mouth at bedtime.  Dispense: 90 tablet; Refill:  3  BPH with obstruction/lower urinary tract symptoms -     Alfuzosin HCl ER; Take 1 tablet (10 mg total) by mouth daily. In the morning  Dispense: 90 tablet; Refill: 3  HTN (hypertension), benign -     Spironolactone; Take 0.5 tablets (12.5 mg total) by mouth daily. In the morning  Dispense: 45 tablet; Refill: 3 -     Olmesartan Medoxomil; Take 1 tablet (40 mg total) by mouth at bedtime.  Dispense: 90 tablet; Refill: 3 -     NIFEdipine ER Osmotic Release; Take 2 tablets (120 mg total) by mouth at bedtime.  Dispense: 180 tablet; Refill: 3 -     hydroCHLOROthiazide; Take 1 tablet (25 mg total) by mouth daily. In the morning  Dispense: 90 tablet; Refill: 1  Long term current use of anticoagulant therapy -      Rivaroxaban; Take 1 tablet (20 mg total) by mouth at bedtime.  Dispense: 90 tablet; Refill: 3  Acute deep vein thrombosis (DVT) of right lower extremity, unspecified vein (HCC) -     Rivaroxaban; Take 1 tablet (20 mg total) by mouth at bedtime.  Dispense: 90 tablet; Refill: 3  Mixed hyperlipidemia -     Atorvastatin Calcium; Take 1 tablet (20 mg total) by mouth daily. Take in the morning.  Dispense: 90 tablet; Refill: 3     Return in about 4 weeks (around 01/06/2024) for HTN -- ok to cancel the april 7th appt. [name], MD

## 2023-12-09 NOTE — Patient Instructions (Signed)
 Pill packs will be coming from [facility] outpatient pharmacy-- will be delivered to your home-- when you get them just take what is in the pill pack.

## 2023-12-10 ENCOUNTER — Other Ambulatory Visit: Payer: Self-pay

## 2023-12-10 ENCOUNTER — Other Ambulatory Visit (HOSPITAL_COMMUNITY): Payer: Self-pay

## 2023-12-10 ENCOUNTER — Telehealth: Payer: Self-pay | Admitting: Family Medicine

## 2023-12-10 NOTE — Telephone Encounter (Signed)
 This RN attempted to reach pt for clarification on med request. This medication is not on pt's current or past medication list and there is no note of it in recent OV notes. LVM for pt to return call to office. Routing to clinic.    Copied from CRM [id number]. Topic: Clinical - Medication Refill >> Dec 10, 2023  3:55 PM Truddie Crumble wrote: Most Recent Primary Care Visit:  Provider: [name]  Department: [facility]  Visit Type: OFFICE VISIT  Date: 12/09/2023  Medication: stiolto-respimat  Has the patient contacted their pharmacy? No (Agent: If no, request that the patient contact the pharmacy for the refill. If patient does not wish to contact the pharmacy document the reason why and proceed with request.) (Agent: If yes, when and what did the pharmacy advise?)  Is this the correct pharmacy for this prescription? Yes If no, delete pharmacy and type the correct one.  This is the patient's preferred pharmacy:   [facility] [address] [place], [place] - [address] AT [facility] [address] [place] [address] Phone: [phone] Fax: [phone]  Has the prescription been filled recently? No  Is the patient out of the medication? Yes  Has the patient been seen for an appointment in the last year OR does the patient have an upcoming appointment? Yes  Can we respond through MyChart? Yes  Agent: Please be advised that Rx refills may take up to 3 business days. We ask that you follow-up with your pharmacy.

## 2023-12-11 ENCOUNTER — Emergency Department (HOSPITAL_COMMUNITY)

## 2023-12-11 ENCOUNTER — Other Ambulatory Visit: Payer: Self-pay

## 2023-12-11 ENCOUNTER — Encounter (HOSPITAL_COMMUNITY): Payer: Self-pay

## 2023-12-11 ENCOUNTER — Other Ambulatory Visit (HOSPITAL_COMMUNITY)

## 2023-12-11 ENCOUNTER — Inpatient Hospital Stay (HOSPITAL_COMMUNITY)
Admission: EM | Admit: 2023-12-11 | Discharge: 2023-12-15 | DRG: 208 | Disposition: A | Discharge status: Home / Self Care | Attending: Family Medicine | Admitting: Family Medicine

## 2023-12-11 DIAGNOSIS — I2510 Atherosclerotic heart disease of native coronary artery without angina pectoris: Secondary | ICD-10-CM | POA: Diagnosis present

## 2023-12-11 DIAGNOSIS — J9622 Acute and chronic respiratory failure with hypercapnia: Secondary | ICD-10-CM | POA: Diagnosis present

## 2023-12-11 DIAGNOSIS — G9341 Metabolic encephalopathy: Secondary | ICD-10-CM | POA: Diagnosis present

## 2023-12-11 DIAGNOSIS — I672 Cerebral atherosclerosis: Secondary | ICD-10-CM | POA: Diagnosis not present

## 2023-12-11 DIAGNOSIS — Z9049 Acquired absence of other specified parts of digestive tract: Secondary | ICD-10-CM

## 2023-12-11 DIAGNOSIS — Z7151 Drug abuse counseling and surveillance of drug abuser: Secondary | ICD-10-CM

## 2023-12-11 DIAGNOSIS — I771 Stricture of artery: Secondary | ICD-10-CM | POA: Diagnosis not present

## 2023-12-11 DIAGNOSIS — Z9911 Dependence on respirator [ventilator] status: Secondary | ICD-10-CM | POA: Diagnosis not present

## 2023-12-11 DIAGNOSIS — M549 Dorsalgia, unspecified: Secondary | ICD-10-CM | POA: Diagnosis present

## 2023-12-11 DIAGNOSIS — G629 Polyneuropathy, unspecified: Secondary | ICD-10-CM | POA: Diagnosis present

## 2023-12-11 DIAGNOSIS — E8729 Other acidosis: Secondary | ICD-10-CM | POA: Diagnosis present

## 2023-12-11 DIAGNOSIS — J449 Chronic obstructive pulmonary disease, unspecified: Secondary | ICD-10-CM | POA: Diagnosis not present

## 2023-12-11 DIAGNOSIS — Z1152 Encounter for screening for COVID-19: Secondary | ICD-10-CM

## 2023-12-11 DIAGNOSIS — Z800 Family history of malignant neoplasm of digestive organs: Secondary | ICD-10-CM | POA: Diagnosis not present

## 2023-12-11 DIAGNOSIS — F419 Anxiety disorder, unspecified: Secondary | ICD-10-CM | POA: Diagnosis present

## 2023-12-11 DIAGNOSIS — I499 Cardiac arrhythmia, unspecified: Secondary | ICD-10-CM | POA: Diagnosis not present

## 2023-12-11 DIAGNOSIS — I10 Essential (primary) hypertension: Secondary | ICD-10-CM | POA: Diagnosis present

## 2023-12-11 DIAGNOSIS — J9690 Respiratory failure, unspecified, unspecified whether with hypoxia or hypercapnia: Secondary | ICD-10-CM | POA: Diagnosis not present

## 2023-12-11 DIAGNOSIS — J9601 Acute respiratory failure with hypoxia: Principal | ICD-10-CM | POA: Diagnosis present

## 2023-12-11 DIAGNOSIS — Z7951 Long term (current) use of inhaled steroids: Secondary | ICD-10-CM

## 2023-12-11 DIAGNOSIS — Z86718 Personal history of other venous thrombosis and embolism: Secondary | ICD-10-CM

## 2023-12-11 DIAGNOSIS — J9621 Acute and chronic respiratory failure with hypoxia: Secondary | ICD-10-CM | POA: Diagnosis present

## 2023-12-11 DIAGNOSIS — Z820 Family history of epilepsy and other diseases of the nervous system: Secondary | ICD-10-CM | POA: Diagnosis not present

## 2023-12-11 DIAGNOSIS — D509 Iron deficiency anemia, unspecified: Secondary | ICD-10-CM | POA: Diagnosis present

## 2023-12-11 DIAGNOSIS — Z8042 Family history of malignant neoplasm of prostate: Secondary | ICD-10-CM

## 2023-12-11 DIAGNOSIS — Z79899 Other long term (current) drug therapy: Secondary | ICD-10-CM

## 2023-12-11 DIAGNOSIS — Z8249 Family history of ischemic heart disease and other diseases of the circulatory system: Secondary | ICD-10-CM | POA: Diagnosis not present

## 2023-12-11 DIAGNOSIS — F32A Depression, unspecified: Secondary | ICD-10-CM | POA: Diagnosis present

## 2023-12-11 DIAGNOSIS — J441 Chronic obstructive pulmonary disease with (acute) exacerbation: Secondary | ICD-10-CM | POA: Diagnosis present

## 2023-12-11 DIAGNOSIS — J9602 Acute respiratory failure with hypercapnia: Secondary | ICD-10-CM | POA: Diagnosis not present

## 2023-12-11 DIAGNOSIS — Z8719 Personal history of other diseases of the digestive system: Secondary | ICD-10-CM

## 2023-12-11 DIAGNOSIS — Z4682 Encounter for fitting and adjustment of non-vascular catheter: Secondary | ICD-10-CM | POA: Diagnosis not present

## 2023-12-11 DIAGNOSIS — Z833 Family history of diabetes mellitus: Secondary | ICD-10-CM | POA: Diagnosis not present

## 2023-12-11 DIAGNOSIS — Z716 Tobacco abuse counseling: Secondary | ICD-10-CM

## 2023-12-11 DIAGNOSIS — K219 Gastro-esophageal reflux disease without esophagitis: Secondary | ICD-10-CM | POA: Diagnosis present

## 2023-12-11 DIAGNOSIS — R6889 Other general symptoms and signs: Secondary | ICD-10-CM | POA: Diagnosis not present

## 2023-12-11 DIAGNOSIS — D508 Other iron deficiency anemias: Secondary | ICD-10-CM | POA: Diagnosis not present

## 2023-12-11 DIAGNOSIS — Z743 Need for continuous supervision: Secondary | ICD-10-CM | POA: Diagnosis not present

## 2023-12-11 DIAGNOSIS — Z7901 Long term (current) use of anticoagulants: Secondary | ICD-10-CM

## 2023-12-11 DIAGNOSIS — F1210 Cannabis abuse, uncomplicated: Secondary | ICD-10-CM | POA: Diagnosis present

## 2023-12-11 DIAGNOSIS — F17210 Nicotine dependence, cigarettes, uncomplicated: Secondary | ICD-10-CM | POA: Diagnosis present

## 2023-12-11 DIAGNOSIS — Z9889 Other specified postprocedural states: Secondary | ICD-10-CM

## 2023-12-11 DIAGNOSIS — Z885 Allergy status to narcotic agent status: Secondary | ICD-10-CM

## 2023-12-11 DIAGNOSIS — M109 Gout, unspecified: Secondary | ICD-10-CM | POA: Diagnosis present

## 2023-12-11 DIAGNOSIS — G4733 Obstructive sleep apnea (adult) (pediatric): Secondary | ICD-10-CM | POA: Diagnosis present

## 2023-12-11 DIAGNOSIS — G8929 Other chronic pain: Secondary | ICD-10-CM | POA: Diagnosis present

## 2023-12-11 DIAGNOSIS — R404 Transient alteration of awareness: Secondary | ICD-10-CM | POA: Diagnosis not present

## 2023-12-11 DIAGNOSIS — Z8619 Personal history of other infectious and parasitic diseases: Secondary | ICD-10-CM

## 2023-12-11 DIAGNOSIS — Z886 Allergy status to analgesic agent status: Secondary | ICD-10-CM

## 2023-12-11 DIAGNOSIS — Z888 Allergy status to other drugs, medicaments and biological substances status: Secondary | ICD-10-CM

## 2023-12-11 DIAGNOSIS — I700 Atherosclerosis of aorta: Secondary | ICD-10-CM | POA: Diagnosis not present

## 2023-12-11 LAB — COMPREHENSIVE METABOLIC PANEL WITH GFR
ALT: 25 U/L (ref 0–44)
AST: 35 U/L (ref 15–41)
Albumin: 4 g/dL (ref 3.5–5.0)
Alkaline Phosphatase: 52 U/L (ref 38–126)
Anion gap: 8 (ref 5–15)
BUN: 12 mg/dL (ref 8–23)
CO2: 28 mmol/L (ref 22–32)
Calcium: 9.1 mg/dL (ref 8.9–10.3)
Chloride: 104 mmol/L (ref 98–111)
Creatinine, Ser: 0.99 mg/dL (ref 0.61–1.24)
GFR, Estimated: >60 mL/min (ref >60)
Glucose, Bld: 245 mg/dL — ABNORMAL HIGH (ref 70–99)
Potassium: 4.2 mmol/L (ref 3.5–5.1)
Sodium: 140 mmol/L (ref 135–145)
Total Bilirubin: 0.4 mg/dL (ref 0.0–1.2)
Total Protein: 7.1 g/dL (ref 6.5–8.1)

## 2023-12-11 LAB — MAGNESIUM: Magnesium: 2.2 mg/dL (ref 1.7–2.4)

## 2023-12-11 LAB — RAPID URINE DRUG SCREEN, HOSP PERFORMED
Amphetamines: NOT DETECTED
Barbiturates: NOT DETECTED
Benzodiazepines: NOT DETECTED
Cocaine: NOT DETECTED
Opiates: NOT DETECTED
Tetrahydrocannabinol: NOT DETECTED

## 2023-12-11 LAB — POCT I-STAT EG7
Acid-base deficit: 3 mmol/L — ABNORMAL HIGH (ref 0.0–2.0)
Bicarbonate: 30.6 mmol/L — ABNORMAL HIGH (ref 20.0–28.0)
Calcium, Ion: 1.26 mmol/L (ref 1.15–1.40)
HCT: 45 % (ref 39.0–52.0)
Hemoglobin: 15.3 g/dL (ref 13.0–17.0)
O2 Saturation: 98 %
Potassium: 4.1 mmol/L (ref 3.5–5.1)
Sodium: 140 mmol/L (ref 135–145)
TCO2: 34 mmol/L — ABNORMAL HIGH (ref 22–32)
pCO2, Ven: 107.6 mmHg (ref 44–60)
pH, Ven: 7.062 — CL (ref 7.25–7.43)
pO2, Ven: 163 mmHg — ABNORMAL HIGH (ref 32–45)

## 2023-12-11 LAB — CBC WITH DIFFERENTIAL/PLATELET
Abs Immature Granulocytes: 0.05 10*3/uL (ref 0.00–0.07)
Basophils Absolute: 0 10*3/uL (ref 0.0–0.1)
Basophils Relative: 1 %
Eosinophils Absolute: 0.1 10*3/uL (ref 0.0–0.5)
Eosinophils Relative: 1 %
HCT: 44.2 % (ref 39.0–52.0)
Hemoglobin: 13.5 g/dL (ref 13.0–17.0)
Immature Granulocytes: 1 %
Lymphocytes Relative: 49 %
Lymphs Abs: 3.8 10*3/uL (ref 0.7–4.0)
MCH: 22.4 pg — ABNORMAL LOW (ref 26.0–34.0)
MCHC: 30.5 g/dL (ref 30.0–36.0)
MCV: 73.4 fL — ABNORMAL LOW (ref 80.0–100.0)
Monocytes Absolute: 0.8 10*3/uL (ref 0.1–1.0)
Monocytes Relative: 10 %
Neutro Abs: 2.9 10*3/uL (ref 1.7–7.7)
Neutrophils Relative %: 38 %
Platelets: 294 10*3/uL (ref 150–400)
RBC: 6.02 MIL/uL — ABNORMAL HIGH (ref 4.22–5.81)
RDW: 19.8 % — ABNORMAL HIGH (ref 11.5–15.5)
WBC: 7.7 10*3/uL (ref 4.0–10.5)
nRBC: 0 % (ref 0.0–0.2)

## 2023-12-11 LAB — RESPIRATORY PANEL BY PCR

## 2023-12-11 LAB — POCT I-STAT 7, (LYTES, BLD GAS, ICA,H+H)
Acid-Base Excess: 3 mmol/L — ABNORMAL HIGH (ref 0.0–2.0)
Bicarbonate: 29 mmol/L — ABNORMAL HIGH (ref 20.0–28.0)
Calcium, Ion: 1.21 mmol/L (ref 1.15–1.40)
HCT: 42 % (ref 39.0–52.0)
Hemoglobin: 14.3 g/dL (ref 13.0–17.0)
O2 Saturation: 74 %
Patient temperature: 36.9
Potassium: 4.1 mmol/L (ref 3.5–5.1)
Sodium: 140 mmol/L (ref 135–145)
TCO2: 30 mmol/L (ref 22–32)
pCO2 arterial: 48.2 mmHg — ABNORMAL HIGH (ref 32–48)
pH, Arterial: 7.387 (ref 7.35–7.45)
pO2, Arterial: 40 mmHg — CL (ref 83–108)

## 2023-12-11 LAB — CREATININE, SERUM
Creatinine, Ser: 1.01 mg/dL (ref 0.61–1.24)
GFR, Estimated: >60 mL/min (ref >60)

## 2023-12-11 LAB — STREP PNEUMONIAE URINARY ANTIGEN: Strep Pneumo Urinary Antigen: NEGATIVE

## 2023-12-11 LAB — CBC
HCT: 38.3 % — ABNORMAL LOW (ref 39.0–52.0)
Hemoglobin: 12 g/dL — ABNORMAL LOW (ref 13.0–17.0)
MCH: 22.3 pg — ABNORMAL LOW (ref 26.0–34.0)
MCHC: 31.3 g/dL (ref 30.0–36.0)
MCV: 71.3 fL — ABNORMAL LOW (ref 80.0–100.0)
Platelets: 229 10*3/uL (ref 150–400)
RBC: 5.37 MIL/uL (ref 4.22–5.81)
RDW: 19.2 % — ABNORMAL HIGH (ref 11.5–15.5)
WBC: 8.8 10*3/uL (ref 4.0–10.5)
nRBC: 0 % (ref 0.0–0.2)

## 2023-12-11 LAB — TROPONIN I (HIGH SENSITIVITY)
Troponin I (High Sensitivity): 12 ng/L (ref <18)
Troponin I (High Sensitivity): 23 ng/L — ABNORMAL HIGH (ref <18)

## 2023-12-11 LAB — POCT I-STAT, CHEM 8
BUN: 13 mg/dL (ref 8–23)
Calcium, Ion: 1.25 mmol/L (ref 1.15–1.40)
Chloride: 103 mmol/L (ref 98–111)
Creatinine, Ser: 1 mg/dL (ref 0.61–1.24)
Glucose, Bld: 245 mg/dL — ABNORMAL HIGH (ref 70–99)
HCT: 45 % (ref 39.0–52.0)
Hemoglobin: 15.3 g/dL (ref 13.0–17.0)
Potassium: 4.1 mmol/L (ref 3.5–5.1)
Sodium: 141 mmol/L (ref 135–145)
TCO2: 32 mmol/L (ref 22–32)

## 2023-12-11 LAB — URINALYSIS, ROUTINE W REFLEX MICROSCOPIC
Bilirubin Urine: NEGATIVE
Glucose, UA: 150 mg/dL — AB
Ketones, ur: NEGATIVE mg/dL
Leukocytes,Ua: NEGATIVE
Nitrite: NEGATIVE
Protein, ur: 100 mg/dL — AB
Specific Gravity, Urine: 1.01 (ref 1.005–1.030)
pH: 6 (ref 5.0–8.0)

## 2023-12-11 LAB — BRAIN NATRIURETIC PEPTIDE: B Natriuretic Peptide: 90.6 pg/mL (ref 0.0–100.0)

## 2023-12-11 LAB — T4, FREE: Free T4: 0.92 ng/dL (ref 0.61–1.12)

## 2023-12-11 LAB — LACTIC ACID, PLASMA: Lactic Acid, Venous: 1.3 mmol/L (ref 0.5–1.9)

## 2023-12-11 LAB — TSH: TSH: 0.278 u[IU]/mL — ABNORMAL LOW (ref 0.350–4.500)

## 2023-12-11 LAB — GLUCOSE, CAPILLARY
Glucose-Capillary: 122 mg/dL — ABNORMAL HIGH (ref 70–99)
Glucose-Capillary: 124 mg/dL — ABNORMAL HIGH (ref 70–99)
Glucose-Capillary: 125 mg/dL — ABNORMAL HIGH (ref 70–99)
Glucose-Capillary: 127 mg/dL — ABNORMAL HIGH (ref 70–99)
Glucose-Capillary: 133 mg/dL — ABNORMAL HIGH (ref 70–99)

## 2023-12-11 LAB — HEMOGLOBIN A1C
Hgb A1c MFr Bld: 5.6 % (ref 4.8–5.6)
Mean Plasma Glucose: 114.02 mg/dL

## 2023-12-11 LAB — MRSA NEXT GEN BY PCR, NASAL: MRSA by PCR Next Gen: DETECTED — AB

## 2023-12-11 LAB — CG4 I-STAT (LACTIC ACID): Lactic Acid, Venous: 1.8 mmol/L (ref 0.5–1.9)

## 2023-12-11 LAB — PHOSPHORUS: Phosphorus: 3.5 mg/dL (ref 2.5–4.6)

## 2023-12-11 LAB — SARS CORONAVIRUS 2 BY RT PCR: SARS Coronavirus 2 by RT PCR: NEGATIVE

## 2023-12-11 MED ORDER — ENOXAPARIN SODIUM 100 MG/ML IJ SOSY
100.0000 mg | PREFILLED_SYRINGE | Freq: Two times a day (BID) | INTRAMUSCULAR | Status: DC
Start: 2023-12-11 — End: 2023-12-12
  Administered 2023-12-11 – 2023-12-12 (×2): 100 mg via SUBCUTANEOUS
  Filled 2023-12-11 (×3): qty 1

## 2023-12-11 MED ORDER — ROCURONIUM BROMIDE 10 MG/ML (PF) SYRINGE
100.0000 mg | PREFILLED_SYRINGE | Freq: Once | INTRAVENOUS | Status: AC
  Administered 2023-12-11: 100 mg via INTRAVENOUS

## 2023-12-11 MED ORDER — DOCUSATE SODIUM 50 MG/5ML PO LIQD
100.0000 mg | Freq: Two times a day (BID) | ORAL | Status: DC
  Administered 2023-12-11: 100 mg

## 2023-12-11 MED ORDER — HEPARIN SODIUM (PORCINE) 5000 UNIT/ML IJ SOLN
5000.0000 [IU] | Freq: Three times a day (TID) | INTRAMUSCULAR | Status: DC
  Administered 2023-12-11: 5000 [IU] via SUBCUTANEOUS
  Filled 2023-12-11: qty 1

## 2023-12-11 MED ORDER — CHLORHEXIDINE GLUCONATE CLOTH 2 % EX PADS
6.0000 | MEDICATED_PAD | Freq: Every day | CUTANEOUS | Status: DC
  Administered 2023-12-11 – 2023-12-13 (×3): 6 via TOPICAL

## 2023-12-11 MED ORDER — MUPIROCIN 2 % EX OINT
1.0000 | TOPICAL_OINTMENT | Freq: Two times a day (BID) | CUTANEOUS | Status: DC
  Administered 2023-12-11 – 2023-12-15 (×8): 1 via NASAL
  Filled 2023-12-11 (×4): qty 22

## 2023-12-11 MED ORDER — ORAL CARE MOUTH RINSE
15.0000 mL | OROMUCOSAL | Status: DC
  Administered 2023-12-11 – 2023-12-12 (×9): 15 mL via OROMUCOSAL

## 2023-12-11 MED ORDER — METHYLPREDNISOLONE SODIUM SUCC 125 MG IJ SOLR
80.0000 mg | INTRAMUSCULAR | Status: DC
  Administered 2023-12-12: 80 mg via INTRAVENOUS
  Filled 2023-12-11: qty 2

## 2023-12-11 MED ORDER — FENTANYL 2500MCG IN NS 250ML (10MCG/ML) PREMIX INFUSION
25.0000 ug/h | INTRAVENOUS | Status: DC
  Administered 2023-12-11: 50 ug/h via INTRAVENOUS
  Administered 2023-12-11: 25 ug/h via INTRAVENOUS
  Administered 2023-12-12: 50 ug/h via INTRAVENOUS
  Filled 2023-12-11 (×2): qty 250

## 2023-12-11 MED ORDER — SODIUM CHLORIDE 0.9 % IV SOLN
500.0000 mg | INTRAVENOUS | Status: AC
  Administered 2023-12-11 – 2023-12-13 (×3): 500 mg via INTRAVENOUS
  Filled 2023-12-11 (×3): qty 5

## 2023-12-11 MED ORDER — LABETALOL HCL 5 MG/ML IV SOLN
10.0000 mg | Freq: Once | INTRAVENOUS | Status: AC
  Administered 2023-12-11: 10 mg via INTRAVENOUS

## 2023-12-11 MED ORDER — PROPOFOL 1000 MG/100ML IV EMUL
0.0000 ug/kg/min | INTRAVENOUS | Status: DC
  Administered 2023-12-11: 20 ug/kg/min via INTRAVENOUS
  Administered 2023-12-11: 40 ug/kg/min via INTRAVENOUS
  Administered 2023-12-11: 50 ug/kg/min via INTRAVENOUS
  Administered 2023-12-11 – 2023-12-12 (×5): 40 ug/kg/min via INTRAVENOUS
  Filled 2023-12-11 (×6): qty 100
  Filled 2023-12-11: qty 200

## 2023-12-11 MED ORDER — FAMOTIDINE 20 MG PO TABS
20.0000 mg | ORAL_TABLET | Freq: Two times a day (BID) | ORAL | Status: DC
  Administered 2023-12-11 – 2023-12-12 (×3): 20 mg
  Filled 2023-12-11 (×3): qty 1

## 2023-12-11 MED ORDER — FENTANYL CITRATE PF 50 MCG/ML IJ SOSY
50.0000 ug | PREFILLED_SYRINGE | Freq: Once | INTRAMUSCULAR | Status: AC
  Administered 2023-12-11: 50 ug via INTRAVENOUS
  Filled 2023-12-11: qty 1

## 2023-12-11 MED ORDER — FENTANYL BOLUS VIA INFUSION
25.0000 ug | INTRAVENOUS | Status: DC | PRN
  Administered 2023-12-11: 100 ug via INTRAVENOUS

## 2023-12-11 MED ORDER — SODIUM CHLORIDE 0.9 % IV SOLN
1.0000 g | INTRAVENOUS | Status: AC
  Administered 2023-12-11 – 2023-12-15 (×5): 1 g via INTRAVENOUS
  Filled 2023-12-11 (×5): qty 10

## 2023-12-11 MED ORDER — DOCUSATE SODIUM 50 MG/5ML PO LIQD: 100.0000 mg | Freq: Two times a day (BID) | ORAL | Status: DC | PRN

## 2023-12-11 MED ORDER — POLYETHYLENE GLYCOL 3350 17 G PO PACK: 17.0000 g | PACK | Freq: Every day | ORAL | Status: DC

## 2023-12-11 MED ORDER — POLYETHYLENE GLYCOL 3350 17 G PO PACK: 17.0000 g | PACK | Freq: Every day | ORAL | Status: DC | PRN

## 2023-12-11 MED ORDER — ETOMIDATE 2 MG/ML IV SOLN
20.0000 mg | Freq: Once | INTRAVENOUS | Status: AC
  Administered 2023-12-11: 20 mg via INTRAVENOUS

## 2023-12-11 MED ORDER — ORAL CARE MOUTH RINSE: 15.0000 mL | OROMUCOSAL | Status: DC | PRN

## 2023-12-11 MED ORDER — IPRATROPIUM-ALBUTEROL 0.5-2.5 (3) MG/3ML IN SOLN
3.0000 mL | Freq: Four times a day (QID) | RESPIRATORY_TRACT | Status: DC
  Administered 2023-12-11 – 2023-12-13 (×9): 3 mL via RESPIRATORY_TRACT
  Filled 2023-12-11 (×9): qty 3

## 2023-12-11 MED ORDER — INSULIN ASPART 100 UNIT/ML IJ SOLN
0.0000 [IU] | INTRAMUSCULAR | Status: DC
  Administered 2023-12-11 – 2023-12-12 (×8): 1 [IU] via SUBCUTANEOUS

## 2023-12-11 MED ORDER — FENTANYL CITRATE PF 50 MCG/ML IJ SOSY
25.0000 ug | PREFILLED_SYRINGE | Freq: Once | INTRAMUSCULAR | Status: AC
  Administered 2023-12-11: 25 ug via INTRAVENOUS

## 2023-12-11 MED ORDER — IPRATROPIUM-ALBUTEROL 0.5-2.5 (3) MG/3ML IN SOLN
3.0000 mL | Freq: Four times a day (QID) | RESPIRATORY_TRACT | Status: DC | PRN
  Administered 2023-12-13 – 2023-12-15 (×4): 3 mL via RESPIRATORY_TRACT
  Filled 2023-12-11 (×3): qty 3

## 2023-12-11 MED ORDER — METHYLPREDNISOLONE SODIUM SUCC 125 MG IJ SOLR
80.0000 mg | Freq: Once | INTRAMUSCULAR | Status: AC
  Administered 2023-12-11: 80 mg via INTRAVENOUS
  Filled 2023-12-11: qty 2

## 2023-12-11 MED ORDER — ONDANSETRON HCL 4 MG/2ML IJ SOLN: 4.0000 mg | Freq: Four times a day (QID) | INTRAMUSCULAR | Status: DC | PRN

## 2023-12-11 NOTE — ED Notes (Signed)
 Attempted to call report to [facility], advised they are not able to take report at this time d/t not having staff to cover this pt until after shift change. Consulting civil engineer notified

## 2023-12-11 NOTE — Progress Notes (Signed)
 ABG sample obtained x2 attempts. Sample is venous not arterial. Dr. Lonzo Candy made aware and results given at this time. No new orders received at this time. RT will continue to monitor and be available as needed.

## 2023-12-11 NOTE — Progress Notes (Signed)
 RT transported pt from CT and back with RN at bedside. No complications at this time.

## 2023-12-11 NOTE — ED Notes (Signed)
 IP bed assignment change, pt has been moved from 45M ICU to 82M ICU, attempted to call report, secretary advised that their charge RN advised will need to call after shift change. ED charge RN notidied

## 2023-12-11 NOTE — ED Notes (Signed)
 Pt return from CT scanner to RM RESUS, NAD noted, pt handling vent well, HTN noted, no changes on cardiac monitor.

## 2023-12-11 NOTE — ED Notes (Addendum)
 Dr. Blinda Leatherwood verbalized to set up for intubation during down time, with RT Gabby at bedside, this RN, and pharmacist [place].  Pt intubated @ 0138 7.5 ET tube 25 secured at the lip + breath sounds bilaterally + color change CO2 detector  Equal chest rise and fall bilaterally.   20 mg etomidate push PIV at 0136 (Whitney RN) 100 mg rocuronium push PIV at 0136 Orthoptist)

## 2023-12-11 NOTE — ED Notes (Signed)
 Receiving RN Jonetta Speak has agreed to accept [facility] once pt has arrived to inpatient unit, all questions and concerns address. RN Alania and RT to transport pt to IP unit with vent while on cardiac monitoring.

## 2023-12-11 NOTE — ED Provider Notes (Signed)
 [place] EMERGENCY DEPARTMENT AT [facility] Provider Note   CSN: [id number] Arrival date & time: 12/11/23  0130     History  Chief Complaint  Patient presents with   Respiratory Distress    [name] [name] is a 73 y.o. male.  Patient brought to the emergency department by EMS from home.  Patient's wife reported that she woke up and found him in respiratory distress, trying to administer breathing treatment to himself.  He does have a history of COPD, not oxygen dependent.  Wife placed him on supplemental oxygen while awaiting EMS.  EMS report the patient was in distress, diaphoretic, tripoding when they arrived.  Patient has been given Solu-Medrol and breathing treatments during transport.       Home Medications Prior to Admission medications   Medication Sig Start Date End Date Taking? Authorizing Provider  albuterol (PROVENTIL HFA;VENTOLIN HFA) 108 (90 Base) MCG/ACT inhaler Inhale 2 puffs into the lungs every 6 (six) hours as needed for wheezing or shortness of breath. 01/27/17   Derwood Kaplan, MD  albuterol (PROVENTIL) (2.5 MG/3ML) 0.083% nebulizer solution Take 2.5 mg by nebulization every 6 (six) hours as needed for wheezing or shortness of breath. 02/04/23   [provider]  alfuzosin (UROXATRAL) 10 MG 24 hr tablet Take 1 tablet (10 mg total) by mouth every morning 12/09/23   [name], MD  ARIPiprazole (ABILIFY) 10 MG tablet Take 1 tablet (10 mg total) by mouth at bedtime. 12/09/23   [name], MD  atorvastatin (LIPITOR) 20 MG tablet Take 1 tablet (20 mg total) by mouth in the morning 12/09/23   [name], MD  Budeson-Glycopyrrol-Formoterol (BREZTRI AEROSPHERE) 160-9-4.8 MCG/ACT AERO Inhale 2 puffs into the lungs 2 (two) times daily. 08/14/23   [name], MD  cyclobenzaprine (FLEXERIL) 10 MG tablet Take 1 tablet (10 mg total) by mouth 3 (three) times daily as needed for muscle spasms. 10/16/23   [name], MD  DULoxetine  (CYMBALTA) 20 MG capsule Take 1 capsule (20 mg total) by mouth every morning. 12/09/23   [name], MD  DULoxetine (CYMBALTA) 60 MG capsule Take 1 capsule (60 mg total) by mouth every morning. To equal 80 mg daily 12/09/23   [name], MD  hydrochlorothiazide (HYDRODIURIL) 25 MG tablet Take 1 tablet (25 mg total) by mouth every morning. 12/09/23   [name], MD  nicotine (NICODERM CQ - DOSED IN MG/24 HOURS) 14 mg/24hr patch Place 1 patch (14 mg total) onto the skin daily. 08/14/23   [name], MD  nicotine (NICODERM CQ - DOSED IN MG/24 HOURS) 21 mg/24hr patch Place 1 patch (21 mg total) onto the skin daily. 08/14/23   [name], MD  nicotine (NICODERM CQ - DOSED IN MG/24 HR) 7 mg/24hr patch Place 1 patch (7 mg total) onto the skin daily. 08/14/23   [name], MD  NIFEdipine (PROCARDIA XL/NIFEDICAL XL) 60 MG 24 hr tablet Take 2 tablets (120 mg total) by mouth at bedtime. 12/09/23   [name], MD  olmesartan (BENICAR) 40 MG tablet Take 1 tablet (40 mg total) by mouth at bedtime. 12/09/23   [name], MD  Oxycodone HCl 10 MG TABS Take 10 mg by mouth every 8 (eight) hours as needed. 08/12/23   [provider]  pantoprazole (PROTONIX) 40 MG tablet Take 1 tablet (40 mg total) by mouth every morning 12/09/23   [name], MD  rivaroxaban Carlena Hurl)  20 MG TABS tablet Take 1 tablet (20 mg total) by mouth at bedtime. 12/09/23   [name], MD  spironolactone (ALDACTONE) 25 MG tablet Take 0.5 tablets (12.5 mg total) by mouth every morning. 12/09/23   [name], MD      Allergies    Nsaids, Aspirin, Celecoxib, Ibuprofen, Pregabalin, and Vicodin [hydrocodone-acetaminophen]    Review of Systems   Review of Systems  Physical Exam Updated Vital Signs BP (!) 159/96   Pulse 92   Temp 98.9 F (37.2 C) (Temporal)   Resp 18   Ht 6\' 4"  (1.93 m)   Wt 105 kg   SpO2 99%   BMI 28.18 kg/m  Physical Exam Constitutional:       General: He is in acute distress.     Appearance: He is ill-appearing and diaphoretic.  HENT:     Head: Atraumatic.  Cardiovascular:     Rate and Rhythm: Regular rhythm. Tachycardia present.  Pulmonary:     Effort: Tachypnea, accessory muscle usage, prolonged expiration and respiratory distress present.     Breath sounds: Decreased air movement present. Decreased breath sounds and wheezing present.  Neurological:     Mental Status: He is lethargic.     GCS: GCS eye subscore is 1. GCS verbal subscore is 1. GCS motor subscore is 4.     ED Results / Procedures / Treatments   Labs (all labs ordered are listed, but only abnormal results are displayed) Labs Reviewed  COMPREHENSIVE METABOLIC PANEL WITH GFR - Abnormal; Notable for the following components:      Result Value   Glucose, Bld 245 (*)    All other components within normal limits  URINALYSIS, ROUTINE W REFLEX MICROSCOPIC - Abnormal; Notable for the following components:   Color, Urine STRAW (*)    Glucose, UA 150 (*)    Hgb urine dipstick SMALL (*)    Protein, ur 100 (*)    Bacteria, UA RARE (*)    All other components within normal limits  CBC WITH DIFFERENTIAL/PLATELET - Abnormal; Notable for the following components:   RBC 6.02 (*)    MCV 73.4 (*)    MCH 22.4 (*)    RDW 19.8 (*)    All other components within normal limits  RESPIRATORY PANEL BY PCR  SARS CORONAVIRUS 2 BY RT PCR  BRAIN NATRIURETIC PEPTIDE  LACTIC ACID, PLASMA  CBC WITH DIFFERENTIAL/PLATELET  BLOOD GAS, ARTERIAL  RAPID URINE DRUG SCREEN, HOSP PERFORMED  TROPONIN I (HIGH SENSITIVITY)    EKG None  Radiology DG Chest Port 1 View Result Date: 12/11/2023 CLINICAL DATA:  [id number]. ETT with ventilator dependent respiratory failure. [id number].  Encounter for orogastric tube placement. EXAM: PORTABLE CHEST 1 VIEW PORTABLE ABDOMEN 1 VIEW COMPARISON:  AP and lateral chest 11/27/2023, CT abdomen pelvis no contrast 12/02/2023 FINDINGS: Chest AP portable, one view  at 1:43 a.m.: ETT tip is 3.1 cm from the carina. Overlying monitor wiring. The right CP sulcus was excluded from the exam. There is wall mild cardiomegaly without evidence of CHF. There is no substantial pleural effusion.  The lungs are clear. There is aortic atherosclerosis, tortuosity and ectasia with stable mediastinum. No new osseous findings.  Moderate thoracic spondylosis. Abdomen single view supine, 1:46 a.m.: NGT extends to the left in the stomach with the tip along the proximal fundal wall. There are cholecystectomy clips. The bowel pattern is nonobstructive with moderate retained stool. No supine evidence of free air. There is a caval filter L5 level  and a dorsal fusion construct spanning L3-5. There is degenerative change of the thoracic and lumbar spine. IMPRESSION: 1. ETT tip 3.1 cm from the carina. 2. NGT tip along the proximal fundal wall. 3. No evidence of acute chest or abdominal process. 4. Aortic atherosclerosis, tortuosity and ectasia. 5. Nonobstructive bowel pattern with moderate retained stool. Electronically Signed   By: Almira Bar M.D.   On: 12/11/2023 03:28   DG Abd Portable 1V Result Date: 12/11/2023 CLINICAL DATA:  [id number]. ETT with ventilator dependent respiratory failure. [id number].  Encounter for orogastric tube placement. EXAM: PORTABLE CHEST 1 VIEW PORTABLE ABDOMEN 1 VIEW COMPARISON:  AP and lateral chest 11/27/2023, CT abdomen pelvis no contrast 12/02/2023 FINDINGS: Chest AP portable, one view at 1:43 a.m.: ETT tip is 3.1 cm from the carina. Overlying monitor wiring. The right CP sulcus was excluded from the exam. There is wall mild cardiomegaly without evidence of CHF. There is no substantial pleural effusion.  The lungs are clear. There is aortic atherosclerosis, tortuosity and ectasia with stable mediastinum. No new osseous findings.  Moderate thoracic spondylosis. Abdomen single view supine, 1:46 a.m.: NGT extends to the left in the stomach with the tip along the proximal  fundal wall. There are cholecystectomy clips. The bowel pattern is nonobstructive with moderate retained stool. No supine evidence of free air. There is a caval filter L5 level and a dorsal fusion construct spanning L3-5. There is degenerative change of the thoracic and lumbar spine. IMPRESSION: 1. ETT tip 3.1 cm from the carina. 2. NGT tip along the proximal fundal wall. 3. No evidence of acute chest or abdominal process. 4. Aortic atherosclerosis, tortuosity and ectasia. 5. Nonobstructive bowel pattern with moderate retained stool. Electronically Signed   By: Almira Bar M.D.   On: 12/11/2023 03:28    Procedures Procedure Name: Intubation Date/Time: 12/11/2023 3:19 AM  Performed by: Gilda Crease, MDPre-anesthesia Checklist: Patient identified, Patient being monitored, Emergency Drugs available, Timeout performed and Suction available Oxygen Delivery Method: Non-rebreather mask Preoxygenation: Pre-oxygenation with 100% oxygen Induction Type: Rapid sequence Ventilation: Mask ventilation without difficulty Laryngoscope Size: Glidescope and 3 Grade View: Grade I Tube size: 7.5 mm Number of attempts: 1 Placement Confirmation: ETT inserted through vocal cords under direct vision, CO2 detector and Breath sounds checked- equal and bilateral Secured at: 24 cm Tube secured with: ETT holder Dental Injury: Teeth and Oropharynx as per pre-operative assessment     .Critical Care  Performed by: Gilda Crease, MD Authorized by: Gilda Crease, MD   Critical care provider statement:    Critical care time (minutes):  30   Critical care was necessary to treat or prevent imminent or life-threatening deterioration of the following conditions:  CNS failure or compromise and respiratory failure   Critical care was time spent personally by me on the following activities:  Development of treatment plan with patient or surrogate, discussions with consultants, evaluation of patient's  response to treatment, examination of patient, ordering and review of laboratory studies, ordering and review of radiographic studies, ordering and performing treatments and interventions, pulse oximetry, re-evaluation of patient's condition and review of old charts     Medications Ordered in ED Medications  docusate (COLACE) 50 MG/5ML liquid 100 mg (has no administration in time range)  polyethylene glycol (MIRALAX / GLYCOLAX) packet 17 g (has no administration in time range)  propofol (DIPRIVAN) 1000 MG/100ML infusion (50 mcg/kg/min  105 kg Intravenous Rate/Dose Change 12/11/23 0339)  labetalol (NORMODYNE) injection 10 mg (10 mg Intravenous Given  During Downtime 12/11/23 0153)  etomidate (AMIDATE) injection 20 mg (20 mg Intravenous Given During Downtime 12/11/23 0136)  rocuronium (ZEMURON) injection 100 mg (100 mg Intravenous Given During Downtime 12/11/23 0136)  fentaNYL (SUBLIMAZE) injection 50 mcg (50 mcg Intravenous Given 12/11/23 0420)    ED Course/ Medical Decision Making/ A&P                                 Medical Decision Making Amount and/or Complexity of Data Reviewed Labs: ordered. Radiology: ordered.  Risk OTC drugs. Prescription drug management.   Patient presented in severe respiratory distress.  He does have a history of COPD, not oxygen dependent.  At arrival to the emergency department, patient was lethargic, not protecting his airway.  Decision was made to intubate him.  Intubation was performed without difficulty.  Portable chest x-ray with hyperinflation, no significant abnormalities noted.  Patient hypertensive at arrival.  This improved with propofol for sedation, was also treated with IV labetalol for better blood pressure control.  Blood gas shows significant acidosis secondary to CO2 retention.  Will admit to critical care.        Final Clinical Impression(s) / ED Diagnoses Final diagnoses:  Acute respiratory failure with hypoxia and hypercapnia (HCC)     Rx / DC Orders ED Discharge Orders     None         Darek Eifler, Canary Brim, MD 12/11/23 [phone]

## 2023-12-11 NOTE — ED Triage Notes (Addendum)
 Pt arrived from home BIB [facility], wife called out for difficulty breathing as she woke up to finding the pt stuggling to breath and looking for her breathing treatments as the wife has some for herself. Wife placed pulse ox on pt to finds sats in the 80s. EMS arrived to find pt tripoding, RR labored, with purse lip breathing, absent breath sounds in all fields, with faint wheezing. Pt arrived on a Neb treatment, HTN SBP 200s. Pt not responsive to voice or painful stimuli, diaphoretic and cool to touch.   EMS vitals 240/140 110 HR 96 % Neb Treatment 219 CBG.   2 Duo Nebs 125mg  Solumedrol PIV 2 g magnesium PIV

## 2023-12-11 NOTE — H&P (Signed)
 NAME:  [name] [name], MRN:  [id number], DOB:  01-30-1951, LOS: 0 ADMISSION DATE:  12/11/2023, CONSULTATION DATE:  12/11/23 REFERRING MD:  EDP, CHIEF COMPLAINT:  acute resp failure   History of Present Illness:  73 yo male presented obtunded and decreased respirations. Per EDP pt was obtunded at presentation and req emergent intubation. EMS was called after pt's wife found him in resp distress. Pt's wife placed him on supplemental oxygen (despite pt not requiring at baseline?) however his sats maintained. Once EMS arrived, pt was noted to be in distress with diaphoresis and accessory muscle use. Pt did have recent change in medications from cardiology including, increase in olmesartan, start spironolactone.   Upon arrival pt was emergently intubated. Given neb and steroid.  Ccm was asked to admit  System was on downtime during pt's w/u making difficult to see labs/imaging etc. Will need follow up  Pertinent  Medical History  copd Diverticulosis HTN H/o dvt remains on Xarelto Cad Chronic back pain Tobacco abuse  Significant Hospital Events: Including procedures, antibiotic start and stop dates in addition to other pertinent events   Admitted, intubated to ICU 12/11/23  Interim History / Subjective:    Objective   There were no vitals taken for this visit.       No intake or output data in the 24 hours ending 12/11/23 0305 There were no vitals filed for this visit.  Examination: General: sedated, on vent HENT: ncat, diaphoretic, perrla, mmmp Lungs: diminished bilaterally + wheeze Cardiovascular: tachycardic  Abdomen: nt,nd BS+ Extremities: no c/c/e Neuro: sedated on vent reportedly no focal deficits GU: deferred  Resolved Hospital Problem list     Assessment & Plan:  Acute hypoxic/ acute on chronic hypercarbic resp failure -requiring mechanical ventilation -schedule nebs/prn -scheduled steroids -baseline bicarb on bmp is in 30's -check rvp/covid -f/u sputum cx for  completeness sake.  -hold on atypical coverage at this time until EKG complete for qt and azithro use.  Acute metabolic encephalopathy:  -awaiting lab results for today -cth pending -check uds Chronic microcytic anemia -outpt w/u -no indication for transfusion at this time -no overt bleeding   Best Practice (right click and "Reselect all SmartList Selections" daily)   Diet/type: NPO DVT prophylaxis LMWH Pressure ulcer(s): N/A GI prophylaxis: H2B Lines: N/A Foley:  Yes, and it is still needed Code Status:  full code Last date of multidisciplinary goals of care discussion [pending d/w family]  Labs   CBC: No results for input(s): "WBC", "NEUTROABS", "HGB", "HCT", "MCV", "PLT" in the last 168 hours.  Basic Metabolic Panel: No results for input(s): "NA", "K", "CL", "CO2", "GLUCOSE", "BUN", "CREATININE", "CALCIUM", "MG", "PHOS" in the last 168 hours. GFR: Estimated Creatinine Clearance: 109.5 mL/min (by C-G formula based on SCr of 0.8 mg/dL). No results for input(s): "PROCALCITON", "WBC", "LATICACIDVEN" in the last 168 hours.  Liver Function Tests: No results for input(s): "AST", "ALT", "ALKPHOS", "BILITOT", "PROT", "ALBUMIN" in the last 168 hours. No results for input(s): "LIPASE", "AMYLASE" in the last 168 hours. No results for input(s): "AMMONIA" in the last 168 hours.  ABG    Component Value Date/Time   PHART 7.402 03/29/2010 0025   PCO2ART 44.2 03/29/2010 0025   PO2ART 76.4 (L) 03/29/2010 0025   HCO3 26.9 (H) 03/29/2010 0025   TCO2 28 02/20/2011 1226   O2SAT 95.5 03/29/2010 0025     Coagulation Profile: No results for input(s): "INR", "PROTIME" in the last 168 hours.  Cardiac Enzymes: No results for input(s): "CKTOTAL", "CKMB", "CKMBINDEX", "  TROPONINI" in the last 168 hours.  HbA1C: Hemoglobin A1C  Date/Time Value Ref Range Status  03/11/2023 04:17 PM 5.5 4.0 - 5.6 % Final    CBG: No results for input(s): "GLUCAP" in the last 168 hours.  Review of  Systems:   Unobtainable 2/2 intubated sedated status  Past Medical History:  He,  has a past medical history of Anxiety and depression, Back pain, Diverticulitis, Diverticulosis, DVT (deep venous thrombosis) (HCC), Gout, Hernia, History of blood clots, Hypertension, Lesion of right native kidney (01/03/2023), Nausea and vomiting, Neuropathy, peripheral (05/15/2012), and Shingles.   Surgical History:   Past Surgical History:  Procedure Laterality Date   APPENDECTOMY     BACK SURGERY     CHOLECYSTECTOMY  09/09/2006   HERNIA REPAIR     HIATAL HERNIA REPAIR     IR ANGIO INTRA EXTRACRAN SEL INTERNAL CAROTID BILAT MOD SED  01/18/2020   IR ANGIO VERTEBRAL SEL VERTEBRAL BILAT MOD SED  01/18/2020   IR US GUIDE VASC ACCESS RIGHT  01/18/2020   LEFT HEART CATHETERIZATION WITH CORONARY ANGIOGRAM N/A 08/02/2014   Procedure: LEFT HEART CATHETERIZATION WITH CORONARY ANGIOGRAM;  Surgeon: Pamella Pert, MD;  Location: [facility] CATH LAB;  Service: Cardiovascular;  Laterality: N/A;   MULTIPLE EXTRACTIONS WITH ALVEOLOPLASTY N/A 10/19/2015   Procedure: Extraction of tooth #'s 2,12,13 with alveoloplasty;  Surgeon: Charlynne Pander, DDS;  Location: [facility] OR;  Service: Oral Surgery;  Laterality: N/A;   SACRAL NERVE STIMULATOR PLACEMENT  09/09/2009   [employer] Restore[employer]VANCED [phone] MRI info-[email or web address]     Social History:   reports that he has been smoking cigarettes. He has a 20 pack-year smoking history. He has never used smokeless tobacco. He reports current alcohol use. He reports current drug use. Drug: Marijuana.   Family History:  His family history includes Colon cancer in his maternal uncle; Diabetes in his mother; Heart disease in his father and mother; Multiple sclerosis in his daughter; Pancreatic cancer in his paternal uncle; Prostate cancer in his father, paternal uncle, and paternal uncle. There is no history of Esophageal cancer, Rectal  cancer, or Stomach cancer.   Allergies Allergies  Allergen Reactions   Nsaids     Other Reaction(s): GI Intolerance, Other (See Comments)   Aspirin Other (See Comments)    ulcers   Celecoxib Other (See Comments)    Stomach irritation   Ibuprofen Other (See Comments)    ulcers   Pregabalin Swelling and Other (See Comments)   Vicodin [Hydrocodone-Acetaminophen]     Makes me mean      Home Medications  Prior to Admission medications   Medication Sig Start Date End Date Taking? Authorizing Provider  albuterol (PROVENTIL HFA;VENTOLIN HFA) 108 (90 Base) MCG/ACT inhaler Inhale 2 puffs into the lungs every 6 (six) hours as needed for wheezing or shortness of breath. 01/27/17   Derwood Kaplan, MD  albuterol (PROVENTIL) (2.5 MG/3ML) 0.083% nebulizer solution Take 2.5 mg by nebulization every 6 (six) hours as needed for wheezing or shortness of breath. 02/04/23   [provider]  alfuzosin (UROXATRAL) 10 MG 24 hr tablet Take 1 tablet (10 mg total) by mouth every morning 12/09/23   [name], MD  ARIPiprazole (ABILIFY) 10 MG tablet Take 1 tablet (10 mg total) by mouth at bedtime. 12/09/23   [name], MD  atorvastatin (LIPITOR) 20 MG tablet Take 1 tablet (20 mg total) by mouth in the morning 12/09/23   [name], MD  Budeson-Glycopyrrol-Formoterol (BREZTRI AEROSPHERE) 160-9-4.8 MCG/ACT AERO  Inhale 2 puffs into the lungs 2 (two) times daily. 08/14/23   [name], MD  cyclobenzaprine (FLEXERIL) 10 MG tablet Take 1 tablet (10 mg total) by mouth 3 (three) times daily as needed for muscle spasms. 10/16/23   [name], MD  DULoxetine (CYMBALTA) 20 MG capsule Take 1 capsule (20 mg total) by mouth every morning. 12/09/23   [name], MD  DULoxetine (CYMBALTA) 60 MG capsule Take 1 capsule (60 mg total) by mouth every morning. To equal 80 mg daily 12/09/23   [name], MD  hydrochlorothiazide (HYDRODIURIL) 25 MG tablet Take 1 tablet (25 mg total) by  mouth every morning. 12/09/23   [name], MD  nicotine (NICODERM CQ - DOSED IN MG/24 HOURS) 14 mg/24hr patch Place 1 patch (14 mg total) onto the skin daily. 08/14/23   [name], MD  nicotine (NICODERM CQ - DOSED IN MG/24 HOURS) 21 mg/24hr patch Place 1 patch (21 mg total) onto the skin daily. 08/14/23   [name], MD  nicotine (NICODERM CQ - DOSED IN MG/24 HR) 7 mg/24hr patch Place 1 patch (7 mg total) onto the skin daily. 08/14/23   [name], MD  NIFEdipine (PROCARDIA XL/NIFEDICAL XL) 60 MG 24 hr tablet Take 2 tablets (120 mg total) by mouth at bedtime. 12/09/23   [name], MD  olmesartan (BENICAR) 40 MG tablet Take 1 tablet (40 mg total) by mouth at bedtime. 12/09/23   [name], MD  Oxycodone HCl 10 MG TABS Take 10 mg by mouth every 8 (eight) hours as needed. 08/12/23   [provider]  pantoprazole (PROTONIX) 40 MG tablet Take 1 tablet (40 mg total) by mouth every morning 12/09/23   [name], MD  rivaroxaban (XARELTO) 20 MG TABS tablet Take 1 tablet (20 mg total) by mouth at bedtime. 12/09/23   [name], MD  spironolactone (ALDACTONE) 25 MG tablet Take 0.5 tablets (12.5 mg total) by mouth every morning. 12/09/23   [name], MD     Critical care time: 

## 2023-12-11 NOTE — Telephone Encounter (Signed)
 Noted.

## 2023-12-11 NOTE — ED Notes (Signed)
 RT Gabby notified that a collection culture from tracheal aspirate has been order, RT to collect

## 2023-12-11 NOTE — Telephone Encounter (Signed)
 Pt was just admitted to the hospital -- is intubated -- will wait until he is  discharged to determine what inhalers he will need

## 2023-12-11 NOTE — Progress Notes (Signed)
 NAME:  [name] [name], MRN:  [id number], DOB:  1951-04-30, LOS: 0 ADMISSION DATE:  12/11/2023, CONSULTATION DATE:  12/11/2023 REFERRING MD:  Briant Sites, DO, CHIEF COMPLAINT:  SOB for few hrs   History of Present Illness:  A 73 yo male presented obtunded and decreased respirations. Per EDP pt was obtunded at presentation and req emergent intubation. EMS was called after pt's wife found him in resp distress. Pt's wife placed him on supplemental oxygen (despite pt not requiring at baseline?) however his sats maintained. Once EMS arrived, pt was noted to be in distress with diaphoresis and accessory muscle use. Pt did have recent change in medications from cardiology including, increase in olmesartan, start spironolactone.    Upon arrival pt was emergently intubated. Given neb and steroid.  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 rate 15, height 6\' 4"  (1.93 m), weight 105 kg, SpO2 99%.    Vent Mode: PRVC FiO2 (%):  [40 %-100 %] 40 % Set Rate:  [15 bmp] 15 bmp Vt Set:  [500 mL-690 mL] 690 mL PEEP:  [5 cmH20] 5 cmH20 Plateau Pressure:  [23 cmH20] 23 cmH20   Intake/Output Summary (Last 24 hours) at 12/11/2023 1300 Last data filed at 12/11/2023 1000 Gross per 24 hour  Intake 259.68 ml  Output --  Net 259.68 ml   Filed Weights   12/11/23 0357  Weight: 105 kg    Examination: General: sedated and comfortable.   HENT: No LNE or thyromegaly. No JVD Lungs: Poor symmetrical air entry bilaterally. No crackles. Exp wheezing Cardiovascular: NL  S1/S2. No m/g/r Abdomen: no distension or tenderness Extremities: trace edema. Symmetrical  Neuro: sedated   Resolved Hospital Problem list     Assessment & Plan:  ECOPD and acute hypoxic and acute on chronic hypercapnic resp failure. He was smoking marijuana heavily yesterday which triggered his symptoms which he had before (previous episodes), but UDS is negative  -MV -schedule nebs/prn -scheduled steroids -Rocephin and Zithromax -check rvp/covid: negative -f/u sputum cx: pending  -ISS -check Mg, TFT, Trop, and Echo   Acute metabolic encephalopathy, due to hypercapnia:  -cth: noted  Chronic microcytic anemia -anemia w/u -no indication for transfusion at this time -no overt bleeding   HTN, CAD: -resume home meds  H/o multiple DVT:  -Lovenox  -Hold Xarelto  Chronic back pain: -Pain Mx  Tobacco/marijuana smoking: -Counseling with appropriate  -outpt PFT  ?OSA: -outpt NPSG  GERD -H2B  Depression -Resume home meds   Best Practice (right click and "Reselect all SmartList Selections" daily)   Diet/type: NPO w/ meds via tube DVT prophylaxis LMWH Pressure ulcer(s): N/A GI prophylaxis: H2B Lines: N/A Foley:  Yes, and it is still needed Code Status:  full code Last date of multidisciplinary goals of care discussion []  D/w wife and daughter   Labs   CBC: Recent Labs  Lab 12/11/23 0135 12/11/23 0223 12/11/23 0623 12/11/23 0926  WBC 7.7  --  8.8  --   NEUTROABS 2.9  --   --   --   HGB 13.5 15.3  15.3  12.0* 14.3  HCT 44.2 45.0  45.0 38.3* 42.0  MCV 73.4*  --  71.3*  --   PLT 294  --  229  --     Basic Metabolic Panel: Recent Labs  Lab 12/11/23 0135 12/11/23 0223 12/11/23 0623 12/11/23 0834 12/11/23 0926  NA 140 141  140  --   --  140  K 4.2 4.1  4.1  --   --  4.1  CL 104 103  --   --   --   CO2 28  --   --   --   --   GLUCOSE 245* 245*  --   --   --   BUN 12 13  --   --   --   CREATININE 0.99 1.00 1.01  --   --   CALCIUM 9.1  --    --   --   --   MG  --   --   --  2.2  --   PHOS  --   --   --  3.5  --    GFR: Estimated Creatinine Clearance: 86.7 mL/min (by C-G formula based on SCr of 1.01 mg/dL). Recent Labs  Lab 12/11/23 0135 12/11/23 0223 12/11/23 0411 12/11/23 0623  WBC 7.7  --   --  8.8  LATICACIDVEN  --  1.8 1.3  --     Liver Function Tests: Recent Labs  Lab 12/11/23 0135  AST 35  ALT 25  ALKPHOS 52  BILITOT 0.4  PROT 7.1  ALBUMIN 4.0   No results for input(s): "LIPASE", "AMYLASE" in the last 168 hours. No results for input(s): "AMMONIA" in the last 168 hours.  ABG    Component Value Date/Time   PHART 7.387 12/11/2023 0926   PCO2ART 48.2 (H) 12/11/2023 0926   PO2ART 40 (LL) 12/11/2023 0926   HCO3 29.0 (H) 12/11/2023 0926   TCO2 30 12/11/2023 0926   ACIDBASEDEF 3.0 (H) 12/11/2023 0223   O2SAT 74 12/11/2023 0926     Coagulation Profile: No results for input(s): "INR", "PROTIME" in the last 168 hours.  Cardiac Enzymes: No results for input(s): "CKTOTAL", "CKMB", "CKMBINDEX", "TROPONINI" in the last 168 hours.  HbA1C: Hemoglobin A1C  Date/Time Value Ref Range Status  03/11/2023 04:17 PM 5.5 4.0 - 5.6 % Final   Hgb A1c MFr Bld  Date/Time Value Ref Range Status  12/11/2023 06:23 AM 5.6 4.8 - 5.6 % Final    Comment:    (NOTE) Pre diabetes:          5.7%-6.4%  Diabetes:              >6.4%  Glycemic control for   <7.0% adults with diabetes     CBG: Recent Labs  Lab 12/11/23 0755 12/11/23 1106  GLUCAP 122* 127*    Review of Systems:   Intubated   Past Medical History:  He,  has a past medical history of Anxiety and depression, Back pain, Diverticulitis, Diverticulosis, DVT (deep venous thrombosis) (HCC), Gout, Hernia, History of blood clots, Hypertension, Lesion of right native kidney (01/03/2023), Nausea and vomiting, Neuropathy, peripheral (05/15/2012), and Shingles.   Surgical History:   Past Surgical History:  Procedure Laterality Date   APPENDECTOMY     BACK  SURGERY     CHOLECYSTECTOMY  09/09/2006   HERNIA REPAIR     HIATAL HERNIA REPAIR     IR ANGIO INTRA EXTRACRAN SEL INTERNAL CAROTID BILAT MOD SED  01/18/2020   IR ANGIO VERTEBRAL SEL  VERTEBRAL BILAT MOD SED  01/18/2020   IR US GUIDE VASC ACCESS RIGHT  01/18/2020   LEFT HEART CATHETERIZATION WITH CORONARY ANGIOGRAM N/A 08/02/2014   Procedure: LEFT HEART CATHETERIZATION WITH CORONARY ANGIOGRAM;  Surgeon: Pamella Pert, MD;  Location: [facility] CATH LAB;  Service: Cardiovascular;  Laterality: N/A;   MULTIPLE EXTRACTIONS WITH ALVEOLOPLASTY N/A 10/19/2015   Procedure: Extraction of tooth #'s 2,12,13 with alveoloplasty;  Surgeon: Charlynne Pander, DDS;  Location: [facility] OR;  Service: Oral Surgery;  Laterality: N/A;   SACRAL NERVE STIMULATOR PLACEMENT  09/09/2009   [employer] Restore[employer]VANCED [phone] MRI info-[email or web address]     Social History:   reports that he has been smoking cigarettes. He has a 20 pack-year smoking history. He has never used smokeless tobacco. He reports current alcohol use. He reports current drug use. Drug: Marijuana.   Family History:  His family history includes Colon cancer in his maternal uncle; Diabetes in his mother; Heart disease in his father and mother; Multiple sclerosis in his daughter; Pancreatic cancer in his paternal uncle; Prostate cancer in his father, paternal uncle, and paternal uncle. There is no history of Esophageal cancer, Rectal cancer, or Stomach cancer.   Allergies Allergies  Allergen Reactions   Nsaids Other (See Comments)     GI Intolerance   Aspirin Other (See Comments)    ulcers   Celecoxib Other (See Comments)    Stomach irritation   Ibuprofen Other (See Comments)    ulcers   Pregabalin Swelling   Vicodin [Hydrocodone-Acetaminophen] Other (See Comments)    Makes me mean      Home Medications  Prior to Admission medications   Medication Sig Start Date End Date Taking? Authorizing  Provider  BREO ELLIPTA 100-25 MCG/ACT AEPB Inhale 1 puff into the lungs daily. 12/03/23  Yes [provider]  rivaroxaban (XARELTO) 20 MG TABS tablet Take 1 tablet (20 mg total) by mouth at bedtime. 12/09/23  Yes [name], MD  albuterol (PROVENTIL HFA;VENTOLIN HFA) 108 (90 Base) MCG/ACT inhaler Inhale 2 puffs into the lungs every 6 (six) hours as needed for wheezing or shortness of breath. 01/27/17  Yes Derwood Kaplan, MD  albuterol (PROVENTIL) (2.5 MG/3ML) 0.083% nebulizer solution Take 2.5 mg by nebulization every 6 (six) hours as needed for wheezing or shortness of breath. 02/04/23  Yes [provider]  alfuzosin (UROXATRAL) 10 MG 24 hr tablet Take 1 tablet (10 mg total) by mouth every morning 12/09/23  Yes [name], MD  ARIPiprazole (ABILIFY) 10 MG tablet Take 1 tablet (10 mg total) by mouth at bedtime. 12/09/23  Yes [name], MD  atorvastatin (LIPITOR) 20 MG tablet Take 1 tablet (20 mg total) by mouth in the morning 12/09/23  Yes [name], MD  Budeson-Glycopyrrol-Formoterol (BREZTRI AEROSPHERE) 160-9-4.8 MCG/ACT AERO Inhale 2 puffs into the lungs 2 (two) times daily. Patient not taking: Reported on 12/11/2023 08/14/23   [name], MD  cyclobenzaprine (FLEXERIL) 10 MG tablet Take 1 tablet (10 mg total) by mouth 3 (three) times daily as needed for muscle spasms. 10/16/23  Yes [name], MD  DULoxetine (CYMBALTA) 20 MG capsule Take 1 capsule (20 mg total) by mouth every morning. 12/09/23  Yes [name], MD  DULoxetine (CYMBALTA) 60 MG capsule Take 1 capsule (60 mg total) by mouth every morning. To equal 80 mg daily 12/09/23  Yes [name], MD  GELSYN-3 16.8 MG/2ML SOSY intra-articular injection  11/12/23  Yes [provider]  hydrochlorothiazide (HYDRODIURIL)  25 MG tablet Take 1 tablet (25 mg total) by mouth every morning. 12/09/23  Yes [name], MD  nicotine (NICODERM CQ - DOSED IN MG/24 HOURS) 14 mg/24hr  patch Place 1 patch (14 mg total) onto the skin daily. 08/14/23   [name], MD  nicotine (NICODERM CQ - DOSED IN MG/24 HOURS) 21 mg/24hr patch Place 1 patch (21 mg total) onto the skin daily. 08/14/23   [name], MD  nicotine (NICODERM CQ - DOSED IN MG/24 HR) 7 mg/24hr patch Place 1 patch (7 mg total) onto the skin daily. 08/14/23   [name], MD  NIFEdipine (PROCARDIA XL/NIFEDICAL XL) 60 MG 24 hr tablet Take 2 tablets (120 mg total) by mouth at bedtime. 12/09/23  Yes [name], MD  olmesartan (BENICAR) 40 MG tablet Take 1 tablet (40 mg total) by mouth at bedtime. 12/09/23  Yes [name], MD  Oxycodone HCl 10 MG TABS Take 10 mg by mouth every 8 (eight) hours as needed. 08/12/23   [provider]  pantoprazole (PROTONIX) 40 MG tablet Take 1 tablet (40 mg total) by mouth every morning 12/09/23  Yes [name], MD  spironolactone (ALDACTONE) 25 MG tablet Take 0.5 tablets (12.5 mg total) by mouth every morning. 12/09/23   [name], MD     Critical care time: 31 min

## 2023-12-11 NOTE — ED Notes (Signed)
 Had EPIC downtime, pt arrived during downtime, this RN to back chart in EMR from paper charting, pharmacy has back order medications that were administer during downtime, this RN to chart accurate times when administer.

## 2023-12-11 NOTE — ED Notes (Signed)
 Patient transported to CT with this RN, RT Gabby to manage vent, and Dr. Blinda Leatherwood, pt remains on cardiac monitoring.

## 2023-12-11 NOTE — Progress Notes (Signed)
 Pt transported from ED RESUS to 3M12 by RN and RT w/o complications

## 2023-12-12 ENCOUNTER — Other Ambulatory Visit: Payer: Self-pay

## 2023-12-12 ENCOUNTER — Inpatient Hospital Stay (HOSPITAL_COMMUNITY)

## 2023-12-12 DIAGNOSIS — J449 Chronic obstructive pulmonary disease, unspecified: Secondary | ICD-10-CM

## 2023-12-12 DIAGNOSIS — J9601 Acute respiratory failure with hypoxia: Secondary | ICD-10-CM

## 2023-12-12 DIAGNOSIS — D508 Other iron deficiency anemias: Secondary | ICD-10-CM

## 2023-12-12 DIAGNOSIS — J9622 Acute and chronic respiratory failure with hypercapnia: Secondary | ICD-10-CM | POA: Diagnosis not present

## 2023-12-12 LAB — IRON AND TIBC
Iron: 27 ug/dL — ABNORMAL LOW (ref 45–182)
Saturation Ratios: 8 % — ABNORMAL LOW (ref 17.9–39.5)
TIBC: 330 ug/dL (ref 250–450)
UIBC: 303 ug/dL

## 2023-12-12 LAB — BASIC METABOLIC PANEL WITH GFR
Anion gap: 10 (ref 5–15)
BUN: 19 mg/dL (ref 8–23)
CO2: 25 mmol/L (ref 22–32)
Calcium: 8.9 mg/dL (ref 8.9–10.3)
Chloride: 106 mmol/L (ref 98–111)
Creatinine, Ser: 1.06 mg/dL (ref 0.61–1.24)
GFR, Estimated: >60 mL/min (ref >60)
Glucose, Bld: 115 mg/dL — ABNORMAL HIGH (ref 70–99)
Potassium: 3.8 mmol/L (ref 3.5–5.1)
Sodium: 141 mmol/L (ref 135–145)

## 2023-12-12 LAB — ECHOCARDIOGRAM COMPLETE
AR max vel: 2.95 cm2
AV Area VTI: 3.11 cm2
AV Area mean vel: 3.12 cm2
AV Mean grad: 3 mmHg
AV Peak grad: 5.4 mmHg
Ao pk vel: 1.16 m/s
Area-P 1/2: 3.05 cm2
Calc EF: 61.8 %
Height: 76 in
MV VTI: 3.01 cm2
S' Lateral: 4.1 cm
Single Plane A2C EF: 68.8 %
Single Plane A4C EF: 53.5 %
Weight: 3407.43 [oz_av]

## 2023-12-12 LAB — CBC
HCT: 35.2 % — ABNORMAL LOW (ref 39.0–52.0)
Hemoglobin: 11.6 g/dL — ABNORMAL LOW (ref 13.0–17.0)
MCH: 22.6 pg — ABNORMAL LOW (ref 26.0–34.0)
MCHC: 33 g/dL (ref 30.0–36.0)
MCV: 68.6 fL — ABNORMAL LOW (ref 80.0–100.0)
Platelets: 202 10*3/uL (ref 150–400)
RBC: 5.13 MIL/uL (ref 4.22–5.81)
RDW: 18.7 % — ABNORMAL HIGH (ref 11.5–15.5)
WBC: 6.9 10*3/uL (ref 4.0–10.5)
nRBC: 0 % (ref 0.0–0.2)

## 2023-12-12 LAB — GLUCOSE, CAPILLARY
Glucose-Capillary: 123 mg/dL — ABNORMAL HIGH (ref 70–99)
Glucose-Capillary: 121 mg/dL — ABNORMAL HIGH (ref 70–99)
Glucose-Capillary: 147 mg/dL — ABNORMAL HIGH (ref 70–99)
Glucose-Capillary: 114 mg/dL — ABNORMAL HIGH (ref 70–99)
Glucose-Capillary: 123 mg/dL — ABNORMAL HIGH (ref 70–99)

## 2023-12-12 LAB — TRIGLYCERIDES: Triglycerides: 82 mg/dL (ref <150)

## 2023-12-12 LAB — FOLATE: Folate: 11.8 ng/mL (ref >5.9)

## 2023-12-12 LAB — VITAMIN B12: Vitamin B-12: 335 pg/mL (ref 180–914)

## 2023-12-12 LAB — FERRITIN: Ferritin: 14 ng/mL — ABNORMAL LOW (ref 24–336)

## 2023-12-12 LAB — MAGNESIUM: Magnesium: 2.1 mg/dL (ref 1.7–2.4)

## 2023-12-12 MED ORDER — DULOXETINE HCL 20 MG PO CPEP: 20.0000 mg | ORAL_CAPSULE | Freq: Every morning | ORAL | Status: DC

## 2023-12-12 MED ORDER — DULOXETINE HCL 60 MG PO CPEP
80.0000 mg | ORAL_CAPSULE | Freq: Every day | ORAL | Status: DC
  Administered 2023-12-13 – 2023-12-15 (×3): 80 mg via ORAL
  Filled 2023-12-12 (×3): qty 1

## 2023-12-12 MED ORDER — IRBESARTAN 75 MG PO TABS
37.5000 mg | ORAL_TABLET | Freq: Every day | ORAL | Status: DC
  Administered 2023-12-12: 37.5 mg via ORAL
  Filled 2023-12-12 (×2): qty 0.5

## 2023-12-12 MED ORDER — POLYETHYLENE GLYCOL 3350 17 G PO PACK: 17.0000 g | PACK | Freq: Every day | ORAL | Status: DC | PRN

## 2023-12-12 MED ORDER — DULOXETINE HCL 60 MG PO CPEP: 60.0000 mg | ORAL_CAPSULE | Freq: Every morning | ORAL | Status: DC

## 2023-12-12 MED ORDER — NICOTINE 21 MG/24HR TD PT24
21.0000 mg | MEDICATED_PATCH | Freq: Every day | TRANSDERMAL | Status: DC
  Administered 2023-12-12 – 2023-12-15 (×4): 21 mg via TRANSDERMAL
  Filled 2023-12-12 (×4): qty 1

## 2023-12-12 MED ORDER — ORAL CARE MOUTH RINSE: 15.0000 mL | OROMUCOSAL | Status: DC | PRN

## 2023-12-12 MED ORDER — ALFUZOSIN HCL ER 10 MG PO TB24
10.0000 mg | ORAL_TABLET | Freq: Every morning | ORAL | Status: DC
  Administered 2023-12-13 – 2023-12-15 (×3): 10 mg via ORAL
  Filled 2023-12-12 (×3): qty 1

## 2023-12-12 MED ORDER — ATORVASTATIN CALCIUM 10 MG PO TABS
20.0000 mg | ORAL_TABLET | Freq: Every morning | ORAL | Status: DC
Start: 2023-12-13 — End: 2023-12-15
  Administered 2023-12-13 – 2023-12-15 (×3): 20 mg via ORAL
  Filled 2023-12-12 (×3): qty 2

## 2023-12-12 MED ORDER — DOCUSATE SODIUM 50 MG/5ML PO LIQD
100.0000 mg | Freq: Two times a day (BID) | ORAL | Status: DC
  Administered 2023-12-12 – 2023-12-14 (×4): 100 mg via ORAL
  Filled 2023-12-12 (×4): qty 10

## 2023-12-12 MED ORDER — DEXMEDETOMIDINE HCL IN NACL 400 MCG/100ML IV SOLN
0.0000 ug/kg/h | INTRAVENOUS | Status: DC
  Administered 2023-12-12: 0.4 ug/kg/h via INTRAVENOUS
  Filled 2023-12-12: qty 100

## 2023-12-12 MED ORDER — DOCUSATE SODIUM 50 MG/5ML PO LIQD
100.0000 mg | Freq: Two times a day (BID) | ORAL | Status: DC | PRN
Start: 2023-12-12 — End: 2023-12-15
  Administered 2023-12-12: 100 mg via ORAL
  Filled 2023-12-12 (×2): qty 10

## 2023-12-12 MED ORDER — OXYCODONE HCL 5 MG PO TABS
5.0000 mg | ORAL_TABLET | Freq: Two times a day (BID) | ORAL | Status: DC | PRN
  Administered 2023-12-12 – 2023-12-13 (×3): 5 mg via ORAL
  Filled 2023-12-12 (×3): qty 1

## 2023-12-12 MED ORDER — CYCLOBENZAPRINE HCL 10 MG PO TABS
10.0000 mg | ORAL_TABLET | Freq: Three times a day (TID) | ORAL | Status: DC | PRN
  Administered 2023-12-12 – 2023-12-13 (×4): 10 mg via ORAL
  Filled 2023-12-12 (×6): qty 1

## 2023-12-12 MED ORDER — ARIPIPRAZOLE 5 MG PO TABS
10.0000 mg | ORAL_TABLET | Freq: Every day | ORAL | Status: DC
  Administered 2023-12-12 – 2023-12-14 (×3): 10 mg via ORAL
  Filled 2023-12-12: qty 2
  Filled 2023-12-12 (×2): qty 1

## 2023-12-12 MED ORDER — FERROUS SULFATE 325 (65 FE) MG PO TABS
325.0000 mg | ORAL_TABLET | ORAL | Status: DC
  Administered 2023-12-12 – 2023-12-14 (×2): 325 mg via ORAL
  Filled 2023-12-12 (×2): qty 1

## 2023-12-12 MED ORDER — PREDNISONE 20 MG PO TABS
40.0000 mg | ORAL_TABLET | Freq: Every day | ORAL | Status: DC
  Administered 2023-12-13 – 2023-12-14 (×2): 40 mg via ORAL
  Filled 2023-12-12 (×2): qty 2

## 2023-12-12 MED ORDER — POLYETHYLENE GLYCOL 3350 17 G PO PACK
17.0000 g | PACK | Freq: Every day | ORAL | Status: DC
  Filled 2023-12-12: qty 1

## 2023-12-12 MED ORDER — HYDROCHLOROTHIAZIDE 25 MG PO TABS
25.0000 mg | ORAL_TABLET | Freq: Every morning | ORAL | Status: DC
  Administered 2023-12-13 – 2023-12-15 (×3): 25 mg via ORAL
  Filled 2023-12-12 (×3): qty 1

## 2023-12-12 MED ORDER — FLUTICASONE FUROATE-VILANTEROL 100-25 MCG/ACT IN AEPB
1.0000 | INHALATION_SPRAY | Freq: Every day | RESPIRATORY_TRACT | Status: DC
  Administered 2023-12-13 – 2023-12-15 (×2): 1 via RESPIRATORY_TRACT
  Filled 2023-12-12 (×2): qty 28

## 2023-12-12 MED ORDER — TAB-A-VITE/IRON PO TABS
1.0000 | ORAL_TABLET | Freq: Every day | ORAL | Status: DC
  Administered 2023-12-12 – 2023-12-14 (×3): 1 via ORAL
  Filled 2023-12-12 (×4): qty 1

## 2023-12-12 MED ORDER — RIVAROXABAN 10 MG PO TABS
20.0000 mg | ORAL_TABLET | Freq: Every day | ORAL | Status: DC
  Administered 2023-12-12 – 2023-12-14 (×3): 20 mg via ORAL
  Filled 2023-12-12: qty 2
  Filled 2023-12-12 (×2): qty 1

## 2023-12-12 MED ORDER — INSULIN ASPART 100 UNIT/ML IJ SOLN: 0.0000 [IU] | Freq: Three times a day (TID) | INTRAMUSCULAR | Status: DC

## 2023-12-12 MED ORDER — PANTOPRAZOLE SODIUM 40 MG PO TBEC
40.0000 mg | DELAYED_RELEASE_TABLET | Freq: Every morning | ORAL | Status: DC
  Administered 2023-12-13 – 2023-12-15 (×3): 40 mg via ORAL
  Filled 2023-12-12 (×3): qty 1

## 2023-12-12 NOTE — Progress Notes (Signed)
 RT notified of patient self extubating within the last 5 minutes. Upon arrival pt in no distress on 3L nasal cannula. Pt able to strongly speak, give a good cough and no stridor heard at this time. RT will continue to monitor and be available as needed.

## 2023-12-12 NOTE — Progress Notes (Signed)
 NAME:  [name] [name], MRN:  [id number], DOB:  1951/01/10, LOS: 1 ADMISSION DATE:  12/11/2023, CONSULTATION DATE:  12/11/2023 REFERRING MD:  Briant Sites, DO, CHIEF COMPLAINT:  SOB for few hrs   History of Present Illness:  A 73 yo male presented obtunded and decreased respirations. Per EDP pt was obtunded at presentation and req emergent intubation. EMS was called after pt's wife found him in resp distress. Pt's wife placed him on supplemental oxygen (despite pt not requiring at baseline?) however his sats maintained. Once EMS arrived, pt was noted to be in distress with diaphoresis and accessory muscle use. Pt did have recent change in medications from cardiology including, increase in olmesartan, start spironolactone.    Upon arrival pt was emergently intubated. Given neb and steroid.  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 rate 12, height 6\' 4"  (1.93 m), weight 96.6 kg, SpO2 98%.    Vent Mode: PRVC FiO2 (%):  [30 %] 30 % Set Rate:  [15 bmp] 15 bmp Vt Set:  [690 mL] 690 mL PEEP:  [5 cmH20] 5 cmH20 Plateau Pressure:  [22 cmH20-23 cmH20] 22 cmH20   Intake/Output Summary (Last 24 hours) at 12/12/2023 1449 Last data filed at 12/12/2023 1100 Gross per 24 hour  Intake 924.9 ml  Output 593 ml  Net 331.9 ml   Filed Weights   12/11/23 0357 12/12/23 0500  Weight: 105 kg 96.6 kg    Examination: General: awake and oriented x4, wants to eat and his meds    HENT: No LNE or thyromegaly. No JVD Lungs: Poor symmetrical air entry bilaterally. No crackles. Exp wheezing Cardiovascular: NL S1/S2. No m/g/r Abdomen: no distension or tenderness Extremities: trace edema. Symmetrical  Neuro: nonfocal   Resolved Hospital Problem list     Assessment & Plan:  ECOPD and acute hypoxic and acute on chronic hypercapnic resp failure. He was smoking marijuana heavily yesterday which triggered his symptoms which he had before (previous episodes), but UDS is negative  -Self extubated 12/12/2023, on [place] -schedule nebs/prn -scheduled steroids--->PO -Rocephin and Zithromax -check rvp/covid: negative -f/u sputum cx: pending  -ISS AC -resumed home inhalers   -4/4/42025, Echo: EF 60%, G1DD   Acute metabolic encephalopathy, due to hypercapnia: resolved   Chronic microcytic anemia, Fe def -Oral Fe -PPI -Needs outpt GI eval   HTN, CAD: -resumed home meds  H/o multiple DVT:  -s/p Lovenox -resumed home Xarelto  Chronic back pain: -Pain Mx  Tobacco/marijuana smoking: -Counseling with appropriate  -outpt PFT  ?OSA: -outpt NPSG  GERD -PPI  Depression -Resumed home meds   Best Practice (right click and "Reselect all SmartList Selections" daily)   Diet/type: passed the bedside swallow eval, soft diet  DVT prophylaxis Xarelto  Pressure ulcer(s): N/A GI prophylaxis: PPI Lines: N/A Foley:  Yes, and it is still needed--->d/c foley cath Code Status:  full code Last date of multidisciplinary goals of care discussion []  D/w wife and daughter   Labs   CBC: Recent  Labs  Lab 12/11/23 0135 12/11/23 0223 12/11/23 0623 12/11/23 0926 12/12/23 0433  WBC 7.7  --  8.8  --  6.9  NEUTROABS 2.9  --   --   --   --   HGB 13.5 15.3  15.3 12.0* 14.3 11.6*  HCT 44.2 45.0  45.0 38.3* 42.0 35.2*  MCV 73.4*  --  71.3*  --  68.6*  PLT 294  --  229  --  202    Basic Metabolic Panel: Recent Labs  Lab 12/11/23 0135 12/11/23 0223 12/11/23 0623  12/11/23 0834 12/11/23 0926 12/12/23 0433  NA 140 141  140  --   --  140 141  K 4.2 4.1  4.1  --   --  4.1 3.8  CL 104 103  --   --   --  106  CO2 28  --   --   --   --  25  GLUCOSE 245* 245*  --   --   --  115*  BUN 12 13  --   --   --  19  CREATININE 0.99 1.00 1.01  --   --  1.06  CALCIUM 9.1  --   --   --   --  8.9  MG  --   --   --  2.2  --  2.1  PHOS  --   --   --  3.5  --   --    GFR: Estimated Creatinine Clearance: 76.2 mL/min (by C-G formula based on SCr of 1.06 mg/dL). Recent Labs  Lab 12/11/23 0135 12/11/23 0223 12/11/23 0411 12/11/23 0623 12/12/23 0433  WBC 7.7  --   --  8.8 6.9  LATICACIDVEN  --  1.8 1.3  --   --     Liver Function Tests: Recent Labs  Lab 12/11/23 0135  AST 35  ALT 25  ALKPHOS 52  BILITOT 0.4  PROT 7.1  ALBUMIN 4.0   No results for input(s): "LIPASE", "AMYLASE" in the last 168 hours. No results for input(s): "AMMONIA" in the last 168 hours.  ABG    Component Value Date/Time   PHART 7.387 12/11/2023 0926   PCO2ART 48.2 (H) 12/11/2023 0926   PO2ART 40 (LL) 12/11/2023 0926   HCO3 29.0 (H) 12/11/2023 0926   TCO2 30 12/11/2023 0926   ACIDBASEDEF 3.0 (H) 12/11/2023 0223   O2SAT 74 12/11/2023 0926     Coagulation Profile: No results for input(s): "INR", "PROTIME" in the last 168 hours.  Cardiac Enzymes: No results for input(s): "CKTOTAL", "CKMB", "CKMBINDEX", "TROPONINI" in the last 168 hours.  HbA1C: Hemoglobin A1C  Date/Time Value Ref Range Status  03/11/2023 04:17 PM 5.5 4.0 - 5.6 % Final   Hgb A1c MFr Bld  Date/Time Value Ref Range Status  12/11/2023 06:23 AM 5.6 4.8 - 5.6 % Final    Comment:    (NOTE) Pre diabetes:          5.7%-6.4%  Diabetes:              >6.4%  Glycemic control for   <7.0% adults with diabetes     CBG: Recent Labs  Lab 12/11/23 1935 12/11/23 2352 12/12/23 0314 12/12/23 0722 12/12/23 1118  GLUCAP 124* 125* 123* 121* 147*    Review of Systems:   Review of Systems  Constitutional:   Positive for malaise/fatigue. Negative for chills and fever.  HENT:  Negative for sore throat.   Respiratory:  Positive for cough, shortness of breath and  wheezing. Negative for hemoptysis, sputum production and stridor.   Cardiovascular:  Negative for chest pain, palpitations and leg swelling.  Gastrointestinal:  Negative for abdominal pain, constipation, diarrhea, heartburn, nausea and vomiting.  Genitourinary:  Negative for dysuria, frequency and urgency.  Skin:  Negative for itching.     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   Surgical History:   Past Surgical History:  Procedure Laterality Date   APPENDECTOMY     BACK SURGERY     CHOLECYSTECTOMY  09/09/2006   HERNIA REPAIR     HIATAL HERNIA REPAIR     IR ANGIO INTRA EXTRACRAN SEL INTERNAL CAROTID BILAT MOD SED  01/18/2020   IR ANGIO VERTEBRAL SEL VERTEBRAL BILAT MOD SED  01/18/2020   IR US GUIDE VASC ACCESS RIGHT  01/18/2020   LEFT HEART CATHETERIZATION WITH CORONARY ANGIOGRAM N/A 08/02/2014   Procedure: LEFT HEART CATHETERIZATION WITH CORONARY ANGIOGRAM;  Surgeon: Pamella Pert, MD;  Location: [facility] CATH LAB;  Service: Cardiovascular;  Laterality: N/A;   MULTIPLE EXTRACTIONS WITH ALVEOLOPLASTY N/A 10/19/2015   Procedure: Extraction of tooth #'s 2,12,13 with alveoloplasty;  Surgeon: Charlynne Pander, DDS;  Location: [facility] OR;  Service: Oral Surgery;  Laterality: N/A;   SACRAL NERVE STIMULATOR PLACEMENT  09/09/2009   [employer]ore[employer]VANCED [phone] MRI info-[email or web address]     Social History:   reports that he has been smoking cigarettes. He has a 20 pack-year smoking history. He has  never used smokeless tobacco. He reports current alcohol use. He reports current drug use. Drug: Marijuana.   Family History:  His family history includes Colon cancer in his maternal uncle; Diabetes in his mother; Heart disease in his father and mother; Multiple sclerosis in his daughter; Pancreatic cancer in his paternal uncle; Prostate cancer in his father, paternal uncle, and paternal uncle. There is no history of Esophageal cancer, Rectal cancer, or Stomach cancer.   Allergies Allergies  Allergen Reactions   Nsaids Other (See Comments)     GI Intolerance   Aspirin Other (See Comments)    ulcers   Celecoxib Other (See Comments)    Stomach irritation   Ibuprofen Other (See Comments)    ulcers   Pregabalin Swelling   Vicodin [Hydrocodone-Acetaminophen] Other (See Comments)    Makes me mean      Home Medications  Prior to Admission medications   Medication Sig Start Date End Date Taking? Authorizing Provider  BREO ELLIPTA 100-25 MCG/ACT AEPB Inhale 1 puff into the lungs daily. 12/03/23  Yes [provider]  rivaroxaban (XARELTO) 20 MG TABS tablet Take 1 tablet (20 mg total) by mouth at bedtime. 12/09/23  Yes [name], MD  albuterol (PROVENTIL HFA;VENTOLIN HFA) 108 (90 Base) MCG/ACT inhaler Inhale 2 puffs into the lungs every 6 (six) hours as needed for wheezing or shortness of breath. 01/27/17  Yes Derwood Kaplan, MD  albuterol (PROVENTIL) (2.5 MG/3ML) 0.083% nebulizer solution Take 2.5 mg by nebulization every 6 (six) hours as needed for wheezing or shortness of breath. 02/04/23  Yes [provider]  alfuzosin (UROXATRAL) 10 MG 24 hr tablet Take 1 tablet (10 mg total) by mouth every morning 12/09/23  Yes [name], MD  ARIPiprazole (ABILIFY) 10 MG tablet Take 1 tablet (10 mg total) by mouth at bedtime.  12/09/23  Yes [name], MD  atorvastatin (LIPITOR) 20 MG tablet Take 1 tablet (20 mg total) by mouth in the morning 12/09/23  Yes [name], MD  Budeson-Glycopyrrol-Formoterol (BREZTRI AEROSPHERE) 160-9-4.8 MCG/ACT AERO Inhale 2 puffs into the lungs 2 (two) times daily. Patient not taking: Reported on 12/11/2023 08/14/23   [name], MD  cyclobenzaprine (FLEXERIL) 10 MG tablet Take 1 tablet (10 mg total) by mouth 3 (three) times daily as needed for muscle spasms. 10/16/23  Yes [name], MD  DULoxetine (CYMBALTA) 20 MG capsule Take 1 capsule (20 mg total) by mouth every morning. 12/09/23  Yes [name], MD  DULoxetine (CYMBALTA) 60 MG capsule Take 1 capsule (60 mg total) by mouth every morning. To equal 80 mg daily 12/09/23  Yes [name], MD  GELSYN-3 16.8 MG/2ML SOSY intra-articular injection  11/12/23  Yes [provider]  hydrochlorothiazide (HYDRODIURIL) 25 MG tablet Take 1 tablet (25 mg total) by mouth every morning. 12/09/23  Yes [name], MD  nicotine (NICODERM CQ - DOSED IN MG/24 HOURS) 14 mg/24hr patch Place 1 patch (14 mg total) onto the skin daily. 08/14/23   [name], MD  nicotine (NICODERM CQ - DOSED IN MG/24 HOURS) 21 mg/24hr patch Place 1 patch (21 mg total) onto the skin daily. 08/14/23   [name], MD  nicotine (NICODERM CQ - DOSED IN MG/24 HR) 7 mg/24hr patch Place 1 patch (7 mg total) onto the skin daily. 08/14/23   [name], MD  NIFEdipine (PROCARDIA XL/NIFEDICAL XL) 60 MG 24 hr tablet Take 2 tablets (120 mg total) by mouth at bedtime. 12/09/23  Yes [name], MD  olmesartan (BENICAR) 40 MG tablet Take 1 tablet (40 mg total) by mouth at bedtime. 12/09/23  Yes [name], MD  Oxycodone HCl 10 MG TABS Take 10 mg by mouth every 8 (eight) hours as needed. 08/12/23   [provider]  pantoprazole (PROTONIX) 40 MG tablet Take 1 tablet (40 mg total) by mouth every morning 12/09/23  Yes [name], MD  spironolactone (ALDACTONE) 25 MG tablet Take 0.5 tablets (12.5 mg total) by mouth every morning. 12/09/23   [name], MD      Critical care time: 67 min    Patrici Ranks, MD  [facility] Personal contact information can be found on Amion  If no contact or response made please call [phone]

## 2023-12-13 DIAGNOSIS — J9622 Acute and chronic respiratory failure with hypercapnia: Secondary | ICD-10-CM | POA: Diagnosis not present

## 2023-12-13 DIAGNOSIS — J449 Chronic obstructive pulmonary disease, unspecified: Secondary | ICD-10-CM | POA: Diagnosis not present

## 2023-12-13 DIAGNOSIS — J9601 Acute respiratory failure with hypoxia: Secondary | ICD-10-CM | POA: Diagnosis not present

## 2023-12-13 DIAGNOSIS — D508 Other iron deficiency anemias: Secondary | ICD-10-CM | POA: Diagnosis not present

## 2023-12-13 LAB — GLUCOSE, CAPILLARY
Glucose-Capillary: 109 mg/dL — ABNORMAL HIGH (ref 70–99)
Glucose-Capillary: 118 mg/dL — ABNORMAL HIGH (ref 70–99)
Glucose-Capillary: 81 mg/dL (ref 70–99)
Glucose-Capillary: 92 mg/dL (ref 70–99)
Glucose-Capillary: 94 mg/dL (ref 70–99)

## 2023-12-13 LAB — LEGIONELLA PNEUMOPHILA SEROGP 1 UR AG: L. pneumophila Serogp 1 Ur Ag: NEGATIVE

## 2023-12-13 MED ORDER — IPRATROPIUM-ALBUTEROL 0.5-2.5 (3) MG/3ML IN SOLN
3.0000 mL | Freq: Two times a day (BID) | RESPIRATORY_TRACT | Status: DC
  Administered 2023-12-13 – 2023-12-14 (×2): 3 mL via RESPIRATORY_TRACT
  Filled 2023-12-13 (×2): qty 3

## 2023-12-13 MED ORDER — ACETAMINOPHEN 325 MG PO TABS
650.0000 mg | ORAL_TABLET | Freq: Four times a day (QID) | ORAL | Status: DC | PRN
  Administered 2023-12-13 – 2023-12-14 (×3): 650 mg via ORAL
  Filled 2023-12-13 (×3): qty 2

## 2023-12-13 MED ORDER — NIFEDIPINE ER OSMOTIC RELEASE 60 MG PO TB24
120.0000 mg | ORAL_TABLET | Freq: Every day | ORAL | Status: DC
  Administered 2023-12-13 – 2023-12-14 (×2): 120 mg via ORAL
  Filled 2023-12-13 (×3): qty 2

## 2023-12-13 MED ORDER — OXYCODONE HCL 5 MG PO TABS
10.0000 mg | ORAL_TABLET | Freq: Two times a day (BID) | ORAL | Status: DC | PRN
  Administered 2023-12-14: 10 mg via ORAL
  Filled 2023-12-13: qty 2

## 2023-12-13 MED ORDER — OXYCODONE HCL 5 MG PO TABS
5.0000 mg | ORAL_TABLET | Freq: Once | ORAL | Status: AC
  Administered 2023-12-13: 5 mg via ORAL
  Filled 2023-12-13: qty 1

## 2023-12-13 MED ORDER — HYDRALAZINE HCL 20 MG/ML IJ SOLN
20.0000 mg | INTRAMUSCULAR | Status: DC | PRN
  Administered 2023-12-13 (×3): 20 mg via INTRAVENOUS
  Filled 2023-12-13 (×3): qty 1

## 2023-12-13 MED ORDER — IRBESARTAN 75 MG PO TABS
75.0000 mg | ORAL_TABLET | Freq: Every day | ORAL | Status: DC
  Administered 2023-12-13 – 2023-12-15 (×3): 75 mg via ORAL
  Filled 2023-12-13 (×3): qty 1

## 2023-12-13 NOTE — Progress Notes (Signed)
 NAME:  [name] [name], MRN:  [id number], DOB:  Mar 26, 1951, LOS: 2 ADMISSION DATE:  12/11/2023, CONSULTATION DATE:  12/11/2023 REFERRING MD:  Briant Sites, DO, CHIEF COMPLAINT:  SOB for few hrs   History of Present Illness:  A 73 yo male presented obtunded and decreased respirations. Per EDP pt was obtunded at presentation and req emergent intubation. EMS was called after pt's wife found him in resp distress. Pt's wife placed him on supplemental oxygen (despite pt not requiring at baseline?) however his sats maintained. Once EMS arrived, pt was noted to be in distress with diaphoresis and accessory muscle use. Pt did have recent change in medications from cardiology including, increase in olmesartan, start spironolactone.    Upon arrival pt was emergently intubated. Given neb and steroid.  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 OOB to chair   Interim History / Subjective:  Better  Mild dizziness with standing   Objective   Blood pressure (!) 175/93, pulse 83, temperature 98.7 F (37.1 C), temperature source Oral, resp. rate 17, height 6\' 4"  (1.93 m), weight 104.2 kg, SpO2 96%.     Intake/Output Summary (Last 24 hours) at 12/13/2023 1318 Last data filed at 12/13/2023 0600 Gross per 24 hour  Intake 638.64 ml  Output 1250 ml  Net -611.36 ml   Filed Weights   12/11/23 0357 12/12/23 0500 12/13/23 0500  Weight: 105 kg 96.6 kg 104.2 kg    Examination: General: awake and oriented x4   HENT: No LNE or thyromegaly. No JVD Lungs: better symmetrical air entry  bilaterally. No crackles. Mild exp wheezing Cardiovascular: NL S1/S2. No m/g/r Abdomen: no distension or tenderness Extremities: trace edema. Symmetrical  Neuro: nonfocal   Resolved Hospital Problem list     Assessment & Plan:  ECOPD and acute hypoxic and acute on chronic hypercapnic resp failure. He was smoking marijuana heavily yesterday which triggered his symptoms which he had before (previous episodes), but UDS is negative  -Self extubated 12/12/2023, on [place] -schedule nebs/prn -scheduled steroids PO -Rocephin and Zithromax -check rvp/covid: negative -d/c ISS AC -resumed home inhalers  -Stable to the floor  -4/4/42025, Echo: EF 60%, G1DD   Acute metabolic encephalopathy, due to hypercapnia: resolved   Chronic microcytic anemia, Fe def -Oral Fe -PPI -Needs outpt GI eval   HTN, CAD: -resumed home meds -Increase ARB -Hydralazine PRN  H/o multiple DVT:  -s/p Lovenox -On home Xarelto  Chronic back pain: -Pain Mx  Tobacco/marijuana smoking: -Counseling with appropriate  -outpt PFT  ?OSA: -outpt NPSG  GERD -PPI  Depression -Resumed home meds   Best Practice (right click and "Reselect all SmartList Selections" daily)   Diet/type: passed the bedside swallow eval, soft diet  DVT prophylaxis Xarelto  Pressure ulcer(s): N/A GI prophylaxis: PPI Lines: N/A Foley:  None Code Status:  full code Last date of multidisciplinary goals of care discussion []   Labs   CBC: Recent Labs  Lab 12/11/23 0135 12/11/23 0223 12/11/23 0623 12/11/23 0926 12/12/23 0433  WBC 7.7  --  8.8  --  6.9  NEUTROABS 2.9  --   --   --   --  HGB 13.5 15.3  15.3 12.0* 14.3 11.6*  HCT 44.2 45.0  45.0 38.3* 42.0 35.2*  MCV 73.4*  --  71.3*  --  68.6*  PLT 294  --  229  --  202    Basic Metabolic Panel: Recent Labs  Lab 12/11/23 0135 12/11/23 0223 12/11/23 0623 12/11/23 0834 12/11/23 0926 12/12/23 0433  NA 140 141  140  --   --  140 141  K 4.2 4.1  4.1  --   --  4.1  3.8  CL 104 103  --   --   --  106  CO2 28  --   --   --   --  25  GLUCOSE 245* 245*  --   --   --  115*  BUN 12 13  --   --   --  19  CREATININE 0.99 1.00 1.01  --   --  1.06  CALCIUM 9.1  --   --   --   --  8.9  MG  --   --   --  2.2  --  2.1  PHOS  --   --   --  3.5  --   --    GFR: Estimated Creatinine Clearance: 82.3 mL/min (by C-G formula based on SCr of 1.06 mg/dL). Recent Labs  Lab 12/11/23 0135 12/11/23 0223 12/11/23 0411 12/11/23 0623 12/12/23 0433  WBC 7.7  --   --  8.8 6.9  LATICACIDVEN  --  1.8 1.3  --   --     Liver Function Tests: Recent Labs  Lab 12/11/23 0135  AST 35  ALT 25  ALKPHOS 52  BILITOT 0.4  PROT 7.1  ALBUMIN 4.0   No results for input(s): "LIPASE", "AMYLASE" in the last 168 hours. No results for input(s): "AMMONIA" in the last 168 hours.  ABG    Component Value Date/Time   PHART 7.387 12/11/2023 0926   PCO2ART 48.2 (H) 12/11/2023 0926   PO2ART 40 (LL) 12/11/2023 0926   HCO3 29.0 (H) 12/11/2023 0926   TCO2 30 12/11/2023 0926   ACIDBASEDEF 3.0 (H) 12/11/2023 0223   O2SAT 74 12/11/2023 0926     Coagulation Profile: No results for input(s): "INR", "PROTIME" in the last 168 hours.  Cardiac Enzymes: No results for input(s): "CKTOTAL", "CKMB", "CKMBINDEX", "TROPONINI" in the last 168 hours.  HbA1C: Hemoglobin A1C  Date/Time Value Ref Range Status  03/11/2023 04:17 PM 5.5 4.0 - 5.6 % Final   Hgb A1c MFr Bld  Date/Time Value Ref Range Status  12/11/2023 06:23 AM 5.6 4.8 - 5.6 % Final    Comment:    (NOTE) Pre diabetes:          5.7%-6.4%  Diabetes:              >6.4%  Glycemic control for   <7.0% adults with diabetes     CBG: Recent Labs  Lab 12/12/23 2050 12/13/23 0045 12/13/23 0417 12/13/23 0819 12/13/23 1152  GLUCAP 123* 109* 81 92 94    Review of Systems:   Review of Systems  Constitutional:  Positive for malaise/fatigue. Negative for chills and fever.  HENT:  Negative for sore throat.   Respiratory:   Positive for cough, shortness of breath and wheezing. Negative for hemoptysis, sputum production and stridor.   Cardiovascular:  Negative for chest pain, palpitations and leg swelling.  Gastrointestinal:  Negative for abdominal pain, constipation, diarrhea, heartburn, nausea and vomiting.  Genitourinary:  Negative for dysuria, frequency  and urgency.  Skin:  Negative for itching.     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   Surgical History:   Past Surgical History:  Procedure Laterality Date   APPENDECTOMY     BACK SURGERY     CHOLECYSTECTOMY  09/09/2006   HERNIA REPAIR     HIATAL HERNIA REPAIR     IR ANGIO INTRA EXTRACRAN SEL INTERNAL CAROTID BILAT MOD SED  01/18/2020   IR ANGIO VERTEBRAL SEL VERTEBRAL BILAT MOD SED  01/18/2020   IR US GUIDE VASC ACCESS RIGHT  01/18/2020   LEFT HEART CATHETERIZATION WITH CORONARY ANGIOGRAM N/A 08/02/2014   Procedure: LEFT HEART CATHETERIZATION WITH CORONARY ANGIOGRAM;  Surgeon: Pamella Pert, MD;  Location: [facility] CATH LAB;  Service: Cardiovascular;  Laterality: N/A;   MULTIPLE EXTRACTIONS WITH ALVEOLOPLASTY N/A 10/19/2015   Procedure: Extraction of tooth #'s 2,12,13 with alveoloplasty;  Surgeon: Charlynne Pander, DDS;  Location: [facility] OR;  Service: Oral Surgery;  Laterality: N/A;   SACRAL NERVE STIMULATOR PLACEMENT  09/09/2009   [employer]ore[employer]VANCED [phone] MRI info-[email or web address]     Social History:   reports that he has been smoking cigarettes. He has a 20 pack-year smoking history. He has never used smokeless tobacco. He reports current alcohol use. He reports current drug use. Drug: Marijuana.    Family History:  His family history includes Colon cancer in his maternal uncle; Diabetes in his mother; Heart disease in his father and mother; Multiple sclerosis in his daughter; Pancreatic cancer in his paternal uncle; Prostate cancer in his father, paternal uncle, and paternal uncle. There is no history of Esophageal cancer, Rectal cancer, or Stomach cancer.   Allergies Allergies  Allergen Reactions   Nsaids Other (See Comments)     GI Intolerance   Aspirin Other (See Comments)    ulcers   Celecoxib Other (See Comments)    Stomach irritation   Ibuprofen Other (See Comments)    ulcers   Pregabalin Swelling   Vicodin [Hydrocodone-Acetaminophen] Other (See Comments)    Makes me mean      Home Medications  Prior to Admission medications   Medication Sig Start Date End Date Taking? Authorizing Provider  BREO ELLIPTA 100-25 MCG/ACT AEPB Inhale 1 puff into the lungs daily. 12/03/23  Yes [provider]  rivaroxaban (XARELTO) 20 MG TABS tablet Take 1 tablet (20 mg total) by mouth at bedtime. 12/09/23  Yes [name], MD  albuterol (PROVENTIL HFA;VENTOLIN HFA) 108 (90 Base) MCG/ACT inhaler Inhale 2 puffs into the lungs every 6 (six) hours as needed for wheezing or shortness of breath. 01/27/17  Yes Derwood Kaplan, MD  albuterol (PROVENTIL) (2.5 MG/3ML) 0.083% nebulizer solution Take 2.5 mg by nebulization every 6 (six) hours as needed for wheezing or shortness of breath. 02/04/23  Yes [provider]  alfuzosin (UROXATRAL) 10 MG 24 hr tablet Take 1 tablet (10 mg total) by mouth every morning 12/09/23  Yes [name], MD  ARIPiprazole (ABILIFY) 10 MG tablet Take 1 tablet (10 mg total) by mouth at bedtime. 12/09/23  Yes [name], MD  atorvastatin (LIPITOR) 20 MG tablet Take 1 tablet (20 mg total) by mouth in the morning 12/09/23  Yes [name], MD  Budeson-Glycopyrrol-Formoterol (BREZTRI AEROSPHERE) 160-9-4.8 MCG/ACT AERO Inhale 2  puffs into the  lungs 2 (two) times daily. Patient not taking: Reported on 12/11/2023 08/14/23   [name], MD  cyclobenzaprine (FLEXERIL) 10 MG tablet Take 1 tablet (10 mg total) by mouth 3 (three) times daily as needed for muscle spasms. 10/16/23  Yes [name], MD  DULoxetine (CYMBALTA) 20 MG capsule Take 1 capsule (20 mg total) by mouth every morning. 12/09/23  Yes [name], MD  DULoxetine (CYMBALTA) 60 MG capsule Take 1 capsule (60 mg total) by mouth every morning. To equal 80 mg daily 12/09/23  Yes [name], MD  GELSYN-3 16.8 MG/2ML SOSY intra-articular injection  11/12/23  Yes [provider]  hydrochlorothiazide (HYDRODIURIL) 25 MG tablet Take 1 tablet (25 mg total) by mouth every morning. 12/09/23  Yes [name], MD  nicotine (NICODERM CQ - DOSED IN MG/24 HOURS) 14 mg/24hr patch Place 1 patch (14 mg total) onto the skin daily. 08/14/23   [name], MD  nicotine (NICODERM CQ - DOSED IN MG/24 HOURS) 21 mg/24hr patch Place 1 patch (21 mg total) onto the skin daily. 08/14/23   [name], MD  nicotine (NICODERM CQ - DOSED IN MG/24 HR) 7 mg/24hr patch Place 1 patch (7 mg total) onto the skin daily. 08/14/23   [name], MD  NIFEdipine (PROCARDIA XL/NIFEDICAL XL) 60 MG 24 hr tablet Take 2 tablets (120 mg total) by mouth at bedtime. 12/09/23  Yes [name], MD  olmesartan (BENICAR) 40 MG tablet Take 1 tablet (40 mg total) by mouth at bedtime. 12/09/23  Yes [name], MD  Oxycodone HCl 10 MG TABS Take 10 mg by mouth every 8 (eight) hours as needed. 08/12/23   [provider]  pantoprazole (PROTONIX) 40 MG tablet Take 1 tablet (40 mg total) by mouth every morning 12/09/23  Yes [name], MD  spironolactone (ALDACTONE) 25 MG tablet Take 0.5 tablets (12.5 mg total) by mouth every morning. 12/09/23   [name], MD     Critical care time: 37 min    Patrici Ranks, MD  [facility] Personal  contact information can be found on Amion  If no contact or response made please call [phone]

## 2023-12-13 NOTE — Progress Notes (Signed)
 eLink Physician-Brief Progress Note Patient Name: [name] [name] DOB: 19-Apr-1951 MRN: [id number]   Date of Service  12/13/2023  HPI/Events of Note  Patient admitted with resp failure related to copd.  Severe hypercapnea.  Now extubated.  He has chronic pain in his back.  He is having back pain and headache.  Neurologically intact.  BP 142/64.  On oxy and flexeril at home for back pain.  eICU Interventions  Extra 5 mg oxycodone x 1 now and increase prn dose to 10 mg every 12 hrs prn. Follow exam     Intervention Category Intermediate Interventions: Pain - evaluation and management  [name] [name], [name] [name], P 12/13/2023, 8:59 PM

## 2023-12-14 ENCOUNTER — Other Ambulatory Visit: Payer: Self-pay

## 2023-12-14 DIAGNOSIS — D509 Iron deficiency anemia, unspecified: Secondary | ICD-10-CM

## 2023-12-14 DIAGNOSIS — J441 Chronic obstructive pulmonary disease with (acute) exacerbation: Secondary | ICD-10-CM

## 2023-12-14 MED ORDER — METHYLPREDNISOLONE SODIUM SUCC 40 MG IJ SOLR
40.0000 mg | Freq: Two times a day (BID) | INTRAMUSCULAR | Status: DC
  Administered 2023-12-14 – 2023-12-15 (×3): 40 mg via INTRAVENOUS
  Filled 2023-12-14 (×3): qty 1

## 2023-12-14 MED ORDER — OXYCODONE HCL 5 MG PO TABS
10.0000 mg | ORAL_TABLET | Freq: Four times a day (QID) | ORAL | Status: DC | PRN
  Administered 2023-12-14 – 2023-12-15 (×2): 10 mg via ORAL
  Filled 2023-12-14 (×2): qty 2

## 2023-12-14 MED ORDER — VITAMIN B-12 1000 MCG PO TABS
1000.0000 ug | ORAL_TABLET | Freq: Every day | ORAL | Status: DC
  Administered 2023-12-14 – 2023-12-15 (×2): 1000 ug via ORAL
  Filled 2023-12-14 (×2): qty 1

## 2023-12-14 NOTE — Progress Notes (Signed)
 [facility] PROGRESS NOTE   YAHIA BOTTGER OZD:[id number] DOB: 11/06/1950 DOA: 12/11/2023  PCP: [name], MD  Brief History: This is a 73 year old African-American male with a past medical history of essential hypertension, COPD, history of DVT on rivaroxaban, with current tobacco abuse who presented with shortness of breath and obtunded mental status.  Patient was thought to have respiratory failure due to COPD.  He had to be emergently intubated and was admitted to the ICU.  Consultants: Critical care medicine  Procedures: Echocardiogram    Subjective/Interval History: Patient complains of shortness of breath this morning.  Received breathing treatment earlier this morning without much improvement.  Denies any chest pain.  No nausea or vomiting recently.    Assessment/Plan:  COPD with acute exacerbation/acute hypoxic and hypercapnic respiratory failure Continues to smoke cigarettes and marijuana now. Patient had to be intubated.  Self extubated on 4/4.  Respiratory status is stable though he does complain of shortness of breath this morning. Noted to be on Breo Ellipta DuoNebs and oral prednisone. Air entry is poor with wheezing on examination.  Will change him back to IV steroids.  Continue with DuoNebs.  Noted to be on ceftriaxone, 5-day course. Has been weaned off of oxygen. Counseled regarding his tobacco abuse Echocardiogram shows LVEF of 60%.  Grade 1 diastolic dysfunction noted.  Acute metabolic encephalopathy Secondary to respiratory failure.  Resolved.  Microcytic anemia/iron deficiency Apparently this is chronic.  Needs outpatient evaluation. Anemia panel shows ferritin of 14, iron of 27, TIBC 330, percent saturation 8.  B12 level is 335.  Folic acid 1.8.  Continue with ferrous sulfate.  Will add B12 supplementation as well.  Essential hypertension Blood pressure is reasonably well-controlled with occasional high readings.  Noted to be on HCTZ and  irbesartan.  Continue to monitor.  Check renal function in the morning.  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  Chronic back pain Stable.  Tobacco and marijuana abuse Counseled.  DVT Prophylaxis: On rivaroxaban Code Status: Full code Family Communication: Discussed with patient Disposition Plan: Hopeful return home when improved   Medications: Scheduled:  alfuzosin  10 mg Oral q morning   ARIPiprazole  10 mg Oral QHS   atorvastatin  20 mg Oral q AM   Chlorhexidine Gluconate Cloth  6 each Topical Daily   docusate  100 mg Oral BID   DULoxetine  80 mg Oral Daily   ferrous sulfate  325 mg Oral QODAY   fluticasone furoate-vilanterol  1 puff Inhalation Daily   hydrochlorothiazide  25 mg Oral q morning   ipratropium-albuterol  3 mL Nebulization BID   irbesartan  75 mg Oral Daily   multivitamins with iron  1 tablet Oral Daily   mupirocin ointment  1 Application Nasal BID   nicotine  21 mg Transdermal Daily   NIFEdipine  120 mg Oral QHS   pantoprazole  40 mg Oral q morning   polyethylene glycol  17 g Oral Daily   predniSONE  40 mg Oral Q breakfast   rivaroxaban  20 mg Oral Q supper   Continuous:  cefTRIAXone (ROCEPHIN)  IV 1 g (12/14/23 0859)   dexmedetomidine (PRECEDEX) IV infusion Stopped (12/12/23 1403)   QVZ:DGLOVFIEPPIRJ, cyclobenzaprine, docusate, hydrALAZINE, ipratropium-albuterol, ondansetron (ZOFRAN) IV, mouth rinse, oxyCODONE, polyethylene glycol  Antibiotics: Anti-infectives (From admission, onward)    Start     Dose/Rate Route Frequency Ordered Stop   12/11/23 0915  cefTRIAXone (ROCEPHIN) 1 g in sodium chloride 0.9 % 100 mL IVPB  1 g 200 mL/hr over 30 Minutes Intravenous Every 24 hours 12/11/23 0820 12/16/23 0914   12/11/23 0915  azithromycin (ZITHROMAX) 500 mg in sodium chloride 0.9 % 250 mL IVPB        500 mg 250 mL/hr over 60 Minutes Intravenous Every 24 hours 12/11/23 0820 12/13/23 1123       Objective:  Vital Signs  Vitals:   12/14/23  0100 12/14/23 0200 12/14/23 0207 12/14/23 0511  BP: (!) 148/95 137/83  (!) 147/95  Pulse: 93 (!) 101 100 99  Resp: 14 20 18 18   Temp:  98.1 F (36.7 C)  98.5 F (36.9 C)  TempSrc:  Oral    SpO2: 94% 100% 100% 97%  Weight:      Height:        Intake/Output Summary (Last 24 hours) at 12/14/2023 0958 Last data filed at 12/14/2023 0700 Gross per 24 hour  Intake 1256.97 ml  Output 2350 ml  Net -1093.03 ml   Filed Weights   12/11/23 0357 12/12/23 0500 12/13/23 0500  Weight: 105 kg 96.6 kg 104.2 kg    General appearance: Awake alert.  In no distress Resp: Noted to be tachypneic without use of accessory muscles.  Wheezing heard bilaterally.  No definite crackles.  No rhonchi. Cardio: S1-S2 is normal regular.  No S3-S4.  No rubs murmurs or bruit GI: Abdomen is soft.  Nontender nondistended.  Bowel sounds are present normal.  No masses organomegaly Extremities: No edema.  Full range of motion of lower extremities. Neurologic: Alert and oriented x3.  No focal neurological deficits.    Lab Results:  Data Reviewed: I have personally reviewed following labs and reports of the imaging studies  CBC: Recent Labs  Lab 12/11/23 0135 12/11/23 0223 12/11/23 0623 12/11/23 0926 12/12/23 0433  WBC 7.7  --  8.8  --  6.9  NEUTROABS 2.9  --   --   --   --   HGB 13.5 15.3  15.3 12.0* 14.3 11.6*  HCT 44.2 45.0  45.0 38.3* 42.0 35.2*  MCV 73.4*  --  71.3*  --  68.6*  PLT 294  --  229  --  202    Basic Metabolic Panel: Recent Labs  Lab 12/11/23 0135 12/11/23 0223 12/11/23 0623 12/11/23 0834 12/11/23 0926 12/12/23 0433  NA 140 141  140  --   --  140 141  K 4.2 4.1  4.1  --   --  4.1 3.8  CL 104 103  --   --   --  106  CO2 28  --   --   --   --  25  GLUCOSE 245* 245*  --   --   --  115*  BUN 12 13  --   --   --  19  CREATININE 0.99 1.00 1.01  --   --  1.06  CALCIUM 9.1  --   --   --   --  8.9  MG  --   --   --  2.2  --  2.1  PHOS  --   --   --  3.5  --   --      GFR: Estimated Creatinine Clearance: 82.3 mL/min (by C-G formula based on SCr of 1.06 mg/dL).  Liver Function Tests: Recent Labs  Lab 12/11/23 0135  AST 35  ALT 25  ALKPHOS 52  BILITOT 0.4  PROT 7.1  ALBUMIN 4.0    CBG: Recent Labs  Lab 12/13/23 0045  12/13/23 0417 12/13/23 0819 12/13/23 1152 12/13/23 1546  GLUCAP 109* 81 92 94 118*    Lipid Profile: Recent Labs    12/12/23 0433  TRIG 82    Anemia Panel: Recent Labs    12/12/23 0433  VITAMINB12 335  FOLATE 11.8  FERRITIN 14*  TIBC 330  IRON 27*    Recent Results (from the past 240 hours)  Respiratory (~20 pathogens) panel by PCR     Status: None   Collection Time: 12/11/23  3:16 AM   Specimen: Anterior Nasal Swab; Respiratory  Result Value Ref Range Status   Adenovirus NOT DETECTED NOT DETECTED Final   Coronavirus 229E NOT DETECTED NOT DETECTED Final    Comment: (NOTE) The Coronavirus on the Respiratory Panel, DOES NOT test for the novel  Coronavirus (2019 nCoV)    Coronavirus HKU1 NOT DETECTED NOT DETECTED Final   Coronavirus NL63 NOT DETECTED NOT DETECTED Final   Coronavirus OC43 NOT DETECTED NOT DETECTED Final   Metapneumovirus NOT DETECTED NOT DETECTED Final   Rhinovirus / Enterovirus NOT DETECTED NOT DETECTED Final   Influenza A NOT DETECTED NOT DETECTED Final   Influenza B NOT DETECTED NOT DETECTED Final   Parainfluenza Virus 1 NOT DETECTED NOT DETECTED Final   Parainfluenza Virus 2 NOT DETECTED NOT DETECTED Final   Parainfluenza Virus 3 NOT DETECTED NOT DETECTED Final   Parainfluenza Virus 4 NOT DETECTED NOT DETECTED Final   Respiratory Syncytial Virus NOT DETECTED NOT DETECTED Final   Bordetella pertussis NOT DETECTED NOT DETECTED Final   Bordetella Parapertussis NOT DETECTED NOT DETECTED Final   Chlamydophila pneumoniae NOT DETECTED NOT DETECTED Final   Mycoplasma pneumoniae NOT DETECTED NOT DETECTED Final    Comment: Performed at [facility], [address],  [place], [place] [address]  SARS Coronavirus 2 by RT PCR (hospital order, performed in [facility] hospital lab) *cepheid single result test* Anterior Nasal Swab     Status: None   Collection Time: 12/11/23  3:16 AM   Specimen: Anterior Nasal Swab  Result Value Ref Range Status   SARS Coronavirus 2 by RT PCR NEGATIVE NEGATIVE Final    Comment: Performed at [facility], [address], [place], [place] [address]  MRSA Next Gen by PCR, Nasal     Status: Abnormal   Collection Time: 12/11/23  8:01 AM   Specimen: Nasal Mucosa; Nasal Swab  Result Value Ref Range Status   MRSA by PCR Next Gen DETECTED (A) NOT DETECTED Final    Comment: RESULT CALLED TO, READ BACK BY AND VERIFIED WITH: RN Rodman Key on [phone]  by SM (NOTE) The GeneXpert MRSA Assay (FDA approved for NASAL specimens only), is one component of a comprehensive MRSA colonization surveillance program. It is not intended to diagnose MRSA infection nor to guide or monitor treatment for MRSA infections. Test performance is not FDA approved in patients less than 15 years old. Performed at [facility], [address], [place], [place] [address]       Radiology Studies: ECHOCARDIOGRAM COMPLETE Result Date: 12/12/2023    ECHOCARDIOGRAM REPORT   Patient Name:   [name] [name] Date of Exam: 12/12/2023 Medical Rec #:  [id number]      Height:       76.0 in Accession #:    [id number]     Weight:       213.0 lb Date of Birth:  12/01/1950      BSA:  2.275 m Patient Age:    72 years       BP:           137/85 mmHg Patient Gender: M              HR:           80 bpm. Exam Location:  Inpatient Procedure: 2D Echo, Cardiac Doppler and Color Doppler (Both Spectral and Color            Flow Doppler were utilized during procedure). Indications:    Resp Distress  History:        Patient has no prior history of Echocardiogram examinations.                 Risk Factors:Hypertension and Current Smoker.  Sonographer:    Amy Chionchio Referring  Phys: [id number] OMAR M ALBUSTAMI IMPRESSIONS  1. Left ventricular ejection fraction, by estimation, is 60 to 65%. The left ventricle has normal function. The left ventricle has no regional wall motion abnormalities. There is mild left ventricular hypertrophy. Left ventricular diastolic parameters are consistent with Grade I diastolic dysfunction (impaired relaxation).  2. Right ventricular systolic function is normal. The right ventricular size is normal.  3. Left atrial size was moderately dilated.  4. Right atrial size was mildly dilated.  5. The mitral valve is normal in structure. No evidence of mitral valve regurgitation. No evidence of mitral stenosis.  6. The aortic valve is normal in structure. Aortic valve regurgitation is not visualized. No aortic stenosis is present.  7. The inferior vena cava is dilated in size with <50% respiratory variability, suggesting right atrial pressure of 15 mmHg. FINDINGS  Left Ventricle: Left ventricular ejection fraction, by estimation, is 60 to 65%. The left ventricle has normal function. The left ventricle has no regional wall motion abnormalities. The left ventricular internal cavity size was normal in size. There is  mild left ventricular hypertrophy. Left ventricular diastolic parameters are consistent with Grade I diastolic dysfunction (impaired relaxation). Right Ventricle: The right ventricular size is normal. No increase in right ventricular wall thickness. Right ventricular systolic function is normal. Left Atrium: Left atrial size was moderately dilated. Right Atrium: Right atrial size was mildly dilated. Pericardium: There is no evidence of pericardial effusion. Mitral Valve: The mitral valve is normal in structure. No evidence of mitral valve regurgitation. No evidence of mitral valve stenosis. MV peak gradient, 3.4 mmHg. The mean mitral valve gradient is 1.0 mmHg. Tricuspid Valve: The tricuspid valve is normal in structure. Tricuspid valve regurgitation is  trivial. No evidence of tricuspid stenosis. Aortic Valve: The aortic valve is normal in structure. Aortic valve regurgitation is not visualized. No aortic stenosis is present. Aortic valve mean gradient measures 3.0 mmHg. Aortic valve peak gradient measures 5.4 mmHg. Aortic valve area, by VTI measures 3.11 cm. Pulmonic Valve: The pulmonic valve was normal in structure. Pulmonic valve regurgitation is not visualized. No evidence of pulmonic stenosis. Aorta: The aortic root is normal in size and structure. Venous: The inferior vena cava is dilated in size with less than 50% respiratory variability, suggesting right atrial pressure of 15 mmHg. IAS/Shunts: No atrial level shunt detected by color flow Doppler.  LEFT VENTRICLE PLAX 2D LVIDd:         5.50 cm     Diastology LVIDs:         4.10 cm     LV e' medial:    4.35 cm/s LV PW:  1.10 cm     LV E/e' medial:  17.1 LV IVS:        1.20 cm     LV e' lateral:   7.18 cm/s LVOT diam:     2.20 cm     LV E/e' lateral: 10.4 LV SV:         55 LV SV Index:   24 LVOT Area:     3.80 cm  LV Volumes (MOD) LV vol d, MOD A2C: 95.2 ml LV vol d, MOD A4C: 72.4 ml LV vol s, MOD A2C: 29.7 ml LV vol s, MOD A4C: 33.7 ml LV SV MOD A2C:     65.5 ml LV SV MOD A4C:     72.4 ml LV SV MOD BP:      52.2 ml RIGHT VENTRICLE          IVC RV Basal diam:  4.50 cm  IVC diam: 2.60 cm TAPSE (M-mode): 2.4 cm LEFT ATRIUM              Index        RIGHT ATRIUM           Index LA Vol (A2C):   89.6 ml  39.38 ml/m  RA Area:     20.00 cm LA Vol (A4C):   100.0 ml 43.95 ml/m  RA Volume:   62.90 ml  27.65 ml/m LA Biplane Vol: 94.4 ml  41.49 ml/m  AORTIC VALVE                    PULMONIC VALVE AV Area (Vmax):    2.95 cm     PV Vmax:       0.63 m/s AV Area (Vmean):   3.12 cm     PV Peak grad:  1.6 mmHg AV Area (VTI):     3.11 cm AV Vmax:           116.00 cm/s AV Vmean:          77.400 cm/s AV VTI:            0.176 m AV Peak Grad:      5.4 mmHg AV Mean Grad:      3.0 mmHg LVOT Vmax:         90.10 cm/s  LVOT Vmean:        63.600 cm/s LVOT VTI:          0.144 m LVOT/AV VTI ratio: 0.82 MITRAL VALVE               TRICUSPID VALVE MV Area (PHT): 3.05 cm    TR Peak grad:   18.0 mmHg MV Area VTI:   3.01 cm    TR Vmax:        212.00 cm/s MV Peak grad:  3.4 mmHg MV Mean grad:  1.0 mmHg    SHUNTS MV Vmax:       0.92 m/s    Systemic VTI:  0.14 m MV Vmean:      55.2 cm/s   Systemic Diam: 2.20 cm MV Decel Time: 249 msec MV E velocity: 74.40 cm/s MV A velocity: 83.20 cm/s MV E/A ratio:  0.89 Arvilla Meres MD Electronically signed by Arvilla Meres MD Signature Date/Time: 12/12/2023/11:52:47 AM    Final        LOS: 3 days   Osvaldo Shipper  [facility] Pager on [email or web address]  12/14/2023, 9:58 AM

## 2023-12-15 ENCOUNTER — Ambulatory Visit: Payer: 59 | Admitting: Family Medicine

## 2023-12-15 ENCOUNTER — Other Ambulatory Visit: Payer: Self-pay

## 2023-12-15 DIAGNOSIS — F17210 Nicotine dependence, cigarettes, uncomplicated: Secondary | ICD-10-CM

## 2023-12-15 DIAGNOSIS — J9601 Acute respiratory failure with hypoxia: Secondary | ICD-10-CM | POA: Diagnosis not present

## 2023-12-15 DIAGNOSIS — Z716 Tobacco abuse counseling: Secondary | ICD-10-CM

## 2023-12-15 DIAGNOSIS — J441 Chronic obstructive pulmonary disease with (acute) exacerbation: Secondary | ICD-10-CM | POA: Insufficient documentation

## 2023-12-15 LAB — COMPREHENSIVE METABOLIC PANEL WITH GFR
ALT: 18 U/L (ref 0–44)
AST: 16 U/L (ref 15–41)
Albumin: 3.3 g/dL — ABNORMAL LOW (ref 3.5–5.0)
Alkaline Phosphatase: 35 U/L — ABNORMAL LOW (ref 38–126)
Anion gap: 11 (ref 5–15)
BUN: 13 mg/dL (ref 8–23)
CO2: 23 mmol/L (ref 22–32)
Calcium: 9.1 mg/dL (ref 8.9–10.3)
Chloride: 104 mmol/L (ref 98–111)
Creatinine, Ser: 0.79 mg/dL (ref 0.61–1.24)
GFR, Estimated: >60 mL/min (ref >60)
Glucose, Bld: 123 mg/dL — ABNORMAL HIGH (ref 70–99)
Potassium: 4.5 mmol/L (ref 3.5–5.1)
Sodium: 138 mmol/L (ref 135–145)
Total Bilirubin: 0.4 mg/dL (ref 0.0–1.2)
Total Protein: 5.9 g/dL — ABNORMAL LOW (ref 6.5–8.1)

## 2023-12-15 LAB — CBC
HCT: 38.4 % — ABNORMAL LOW (ref 39.0–52.0)
Hemoglobin: 12.3 g/dL — ABNORMAL LOW (ref 13.0–17.0)
MCH: 22.6 pg — ABNORMAL LOW (ref 26.0–34.0)
MCHC: 32 g/dL (ref 30.0–36.0)
MCV: 70.6 fL — ABNORMAL LOW (ref 80.0–100.0)
Platelets: 227 10*3/uL (ref 150–400)
RBC: 5.44 MIL/uL (ref 4.22–5.81)
RDW: 18.6 % — ABNORMAL HIGH (ref 11.5–15.5)
WBC: 6.5 10*3/uL (ref 4.0–10.5)
nRBC: 0 % (ref 0.0–0.2)

## 2023-12-15 MED ORDER — PREDNISONE 50 MG PO TABS: 50.0000 mg | ORAL_TABLET | Freq: Every day | ORAL | 0 refills | Status: AC

## 2023-12-15 NOTE — Care Management Important Message (Signed)
 Important Message  Patient Details  Name: [name] [name] MRN: [id number] Date of Birth: 1951-08-26   Important Message Given:  Yes - Medicare IM     Dorena Bodo 12/15/2023, 3:09 PM

## 2023-12-15 NOTE — Evaluation (Signed)
 Physical Therapy Brief Evaluation and Discharge Note Patient Details Name: [name] [name] MRN: [id number] DOB: Aug 14, 1951 Today's Date: 12/15/2023   History of Present Illness  73 yo male admitted 4/3 with respiratory distress, hypoxia and obtunded requiring intubation. 4/4 pt self-extubated. PMHx: HTN, COPD, CAD  Clinical Impression  Pt pleasant and reports independence, driving, lives with wife. Pt reports respiratory distress occurred after smoking pot and now back to baseline. SPO2 >94% on RA throughout session. Pt independent with transfers and gait without further therapy needs, will sign off.        PT Assessment Patient does not need any further PT services  Assistance Needed at Discharge  None    Equipment Recommendations None recommended by PT  Recommendations for Other Services       Precautions/Restrictions Precautions Precautions: None        Mobility  Bed Mobility       General bed mobility comments: EOB on arrival  Transfers Overall transfer level: Independent                      Ambulation/Gait Ambulation/Gait assistance: Independent Gait Distance (Feet): 400 Feet Assistive device: None Gait Pattern/deviations: WFL(Within Functional Limits) Gait Speed: Pace WFL    Home Activity Instructions    Stairs            Modified Rankin (Stroke Patients Only)        Balance Overall balance assessment: No apparent balance deficits (not formally assessed)                        Pertinent Vitals/Pain PT - Brief Vital Signs All Vital Signs Stable: Yes (94% on RA) Pain Assessment Pain Assessment: No/denies pain     Home Living Family/patient expects to be discharged to:: Private residence Living Arrangements: Spouse/significant other Available Help at Discharge: Family;Available 24 hours/day Home Environment: Stairs to enter  [employer] of Steps: 1 Home Equipment: None        Prior Function Level of Independence:  Independent      UE/LE Assessment   UE ROM/Strength/Tone/Coordination: WFL    LE ROM/Strength/Tone/Coordination: [facility]      Communication   Communication Communication: No apparent difficulties     Cognition Overall Cognitive Status: Appears within functional limits for tasks assessed/performed       General Comments      Exercises     Assessment/Plan    PT Problem List         PT Visit Diagnosis Other abnormalities of gait and mobility (R26.89)    No Skilled PT All education completed;Patient at baseline level of functioning   Co-evaluation                AMPAC 6 Clicks Help needed turning from your back to your side while in a flat bed without using bedrails?: None Help needed moving from lying on your back to sitting on the side of a flat bed without using bedrails?: None Help needed moving to and from a bed to a chair (including a wheelchair)?: None Help needed standing up from a chair using your arms (e.g., wheelchair or bedside chair)?: None Help needed to walk in hospital room?: None Help needed climbing 3-5 steps with a railing? : None 6 Click Score: 24      End of Session   Activity Tolerance: Patient tolerated treatment well Patient left: in chair;with call bell/phone within reach Nurse Communication: Mobility status PT Visit Diagnosis: Other  abnormalities of gait and mobility (R26.89)     Time: 1220-1230 PT Time Calculation (min) (ACUTE ONLY): 10 min  Charges:   PT Evaluation $PT Eval Low Complexity: 1 Low      Krystalle Pilkington P, PT Acute Rehabilitation Services Office: [phone]   Enedina Finner Rogenia Werntz  12/15/2023, 1:13 PM

## 2023-12-15 NOTE — Discharge Summary (Signed)
 Physician Discharge Summary  [name] [name] ZOX:[id number] DOB: 10-Jan-1951 DOA: 12/11/2023  PCP: [name], MD  Admit date: 12/11/2023 Discharge date: 12/15/2023 30 Day Unplanned Readmission Risk Score    Flowsheet Row ED to Hosp-Admission (Current) from 12/11/2023 in [place] [facility]  30 Day Unplanned Readmission Risk Score (%) 18.5 Filed at 12/15/2023 1200       This score is the patient's risk of an unplanned readmission within 30 days of being discharged (0 -100%). The score is based on dignosis, age, lab data, medications, orders, and past utilization.   Low:  0-14.9   Medium: 15-21.9   High: 22-29.9   Extreme: 30 and above          Admitted From: Home Disposition: Home  Recommendations for Outpatient Follow-up:  Follow up with PCP in 1-2 weeks Please obtain BMP/CBC in one week Please follow up with your PCP on the following pending results: Unresulted Labs (From admission, onward)     Start     Ordered   12/11/23 0556  Culture, Respiratory w Gram Stain (tracheal aspirate)  Once,   R        12/11/23 0556   12/11/23 0135  CBC with Differential/Platelet  Once,   R        12/11/23 0135              Home Health: None Equipment/Devices: None  Discharge Condition: Stable CODE STATUS: Full code Diet recommendation: Cardiac  Subjective: Seen and examined, feels much better.  No shortness of breath, palpitation or any other complaint.  Ready to go home.  Brief/Interim Summary: This is a 73 year old African-American male with a past medical history of essential hypertension, COPD, history of DVT on rivaroxaban, with current tobacco abuse who presented with shortness of breath and obtunded mental status.  Patient was thought to have respiratory failure due to COPD.  He had to be emergently intubated and was admitted to the ICU.  Started on therapy for acute COPD exacerbation, eventually transferred under [facility] care on 12/14/2023.  Details below.  COPD with  acute exacerbation/acute hypoxic and hypercapnic respiratory failure Continues to smoke cigarettes and marijuana now. Patient had to be intubated.  Self extubated on 4/4.  Respiratory status is stable, ambulatory oximetry shows 94% oxygen saturation on room air.  Noted to be on Breo Ellipta DuoNebs and oral prednisone when he was transferred under hospitalist care on 12/14/2023 however he was noted to have wheezing and diminished breath sounds so he was transition back to IV Solu-Medrol.  Today he feels much better, no wheezes and has good air entry.  Feels well as well.  Not hypoxic at all.  Completed 5 days of Rocephin.  Will discharge on 3 more days of prednisone.  Seen by PT OT, they did not recommend any therapy to go home with.   Acute metabolic encephalopathy Secondary to respiratory failure.  Resolved.   Microcytic anemia/iron deficiency Apparently this is chronic.  Needs outpatient evaluation. Anemia panel shows ferritin of 14, iron of 27, TIBC 330, percent saturation 8.  B12 level is 335.  Folic acid 1.8.  Continue with ferrous sulfate.  Will add B12 supplementation as well.   Essential hypertension Blood pressure is reasonably well-controlled with occasional high readings.  Noted to be on HCTZ and irbesartan.  Continue to monitor.  Check renal function in the morning.   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   Chronic back pain Stable.   Tobacco and  marijuana abuse I have discussed tobacco cessation with the patient.  I have counseled the patient regarding the negative impacts of continued tobacco use including but not limited to lung cancer, COPD, and cardiovascular disease.  I have discussed alternatives to tobacco and modalities that may help facilitate tobacco cessation including but not limited to biofeedback, hypnosis, and medications.  Total time spent with tobacco counseling was 5 minutes.   Discharge plan was discussed with patient and/or family member and they verbalized  understanding and agreed with it.  Discharge Diagnoses:  Principal Problem:   Acute hypoxic respiratory failure (HCC) Active Problems:   COPD with acute exacerbation (HCC)   Tobacco abuse counseling    Discharge Instructions   Allergies as of 12/15/2023       Reactions   Nsaids Other (See Comments)    GI Intolerance   Aspirin Other (See Comments)   ulcers   Celecoxib Other (See Comments)   Stomach irritation   Ibuprofen Other (See Comments)   ulcers   Pregabalin Swelling   Vicodin [hydrocodone-acetaminophen] Other (See Comments)   Makes me mean         Medication List     TAKE these medications    albuterol 108 (90 Base) MCG/ACT inhaler Commonly known as: VENTOLIN HFA Inhale 2 puffs into the lungs every 6 (six) hours as needed for wheezing or shortness of breath.   albuterol (2.5 MG/3ML) 0.083% nebulizer solution Commonly known as: PROVENTIL Take 2.5 mg by nebulization every 6 (six) hours as needed for wheezing or shortness of breath.   alfuzosin 10 MG 24 hr tablet Commonly known as: UROXATRAL Take 1 tablet (10 mg total) by mouth every morning   ARIPiprazole 10 MG tablet Commonly known as: Abilify Take 1 tablet (10 mg total) by mouth at bedtime.   atorvastatin 20 MG tablet Commonly known as: LIPITOR Take 1 tablet (20 mg total) by mouth in the morning   Breo Ellipta 100-25 MCG/ACT Aepb Generic drug: fluticasone furoate-vilanterol Inhale 1 puff into the lungs daily.   Breztri Aerosphere 160-9-4.8 MCG/ACT Aero Generic drug: budeson-glycopyrrolate-formoterol Inhale 2 puffs into the lungs 2 (two) times daily.   cyclobenzaprine 10 MG tablet Commonly known as: FLEXERIL Take 1 tablet (10 mg total) by mouth 3 (three) times daily as needed for muscle spasms.   DULoxetine 60 MG capsule Commonly known as: CYMBALTA Take 1 capsule (60 mg total) by mouth every morning. To equal 80 mg daily   DULoxetine 20 MG capsule Commonly known as: CYMBALTA Take 1 capsule  (20 mg total) by mouth every morning.   Gelsyn-3 16.8 MG/2ML Sosy intra-articular injection Generic drug: sodium hyaluronate (viscosup)   hydrochlorothiazide 25 MG tablet Commonly known as: HYDRODIURIL Take 1 tablet (25 mg total) by mouth every morning.   nicotine 21 mg/24hr patch Commonly known as: NICODERM CQ - dosed in mg/24 hours Place 1 patch (21 mg total) onto the skin daily.   nicotine 14 mg/24hr patch Commonly known as: NICODERM CQ - dosed in mg/24 hours Place 1 patch (14 mg total) onto the skin daily.   nicotine 7 mg/24hr patch Commonly known as: NICODERM CQ - dosed in mg/24 hr Place 1 patch (7 mg total) onto the skin daily.   NIFEdipine 60 MG 24 hr tablet Commonly known as: PROCARDIA XL/NIFEDICAL XL Take 2 tablets (120 mg total) by mouth at bedtime.   olmesartan 40 MG tablet Commonly known as: BENICAR Take 1 tablet (40 mg total) by mouth at bedtime.   Oxycodone HCl  10 MG Tabs Take 10 mg by mouth in the morning, at noon, in the evening, and at bedtime.   pantoprazole 40 MG tablet Commonly known as: PROTONIX Take 1 tablet (40 mg total) by mouth every morning   predniSONE 50 MG tablet Commonly known as: DELTASONE Take 1 tablet (50 mg total) by mouth daily with breakfast for 3 days.   rivaroxaban 20 MG Tabs tablet Commonly known as: Xarelto Take 1 tablet (20 mg total) by mouth at bedtime.   spironolactone 25 MG tablet Commonly known as: Aldactone Take 0.5 tablets (12.5 mg total) by mouth every morning.        Follow-up Information     [name], MD Follow up in 1 week(s).   Specialty: Family Medicine Contact information: [address] [place] [address] [phone]                Allergies  Allergen Reactions   Nsaids Other (See Comments)     GI Intolerance   Aspirin Other (See Comments)    ulcers   Celecoxib Other (See Comments)    Stomach irritation   Ibuprofen Other (See Comments)    ulcers   Pregabalin  Swelling   Vicodin [Hydrocodone-Acetaminophen] Other (See Comments)    Makes me mean     Consultations: PCCM   Procedures/Studies: ECHOCARDIOGRAM COMPLETE Result Date: 12/12/2023    ECHOCARDIOGRAM REPORT   Patient Name:   [name] [name] Date of Exam: 12/12/2023 Medical Rec #:  [id number]      Height:       76.0 in Accession #:    [id number]     Weight:       213.0 lb Date of Birth:  1951-02-26      BSA:          2.275 m Patient Age:    72 years       BP:           137/85 mmHg Patient Gender: M              HR:           80 bpm. Exam Location:  Inpatient Procedure: 2D Echo, Cardiac Doppler and Color Doppler (Both Spectral and Color            Flow Doppler were utilized during procedure). Indications:    Resp Distress  History:        Patient has no prior history of Echocardiogram examinations.                 Risk Factors:Hypertension and Current Smoker.  Sonographer:    Amy Chionchio Referring Phys: [id number] OMAR M ALBUSTAMI IMPRESSIONS  1. Left ventricular ejection fraction, by estimation, is 60 to 65%. The left ventricle has normal function. The left ventricle has no regional wall motion abnormalities. There is mild left ventricular hypertrophy. Left ventricular diastolic parameters are consistent with Grade I diastolic dysfunction (impaired relaxation).  2. Right ventricular systolic function is normal. The right ventricular size is normal.  3. Left atrial size was moderately dilated.  4. Right atrial size was mildly dilated.  5. The mitral valve is normal in structure. No evidence of mitral valve regurgitation. No evidence of mitral stenosis.  6. The aortic valve is normal in structure. Aortic valve regurgitation is not visualized. No aortic stenosis is present.  7. The inferior vena cava is dilated in size with <50% respiratory variability, suggesting right atrial pressure of 15 mmHg. FINDINGS  Left Ventricle:  Left ventricular ejection fraction, by estimation, is 60 to 65%. The left ventricle has normal  function. The left ventricle has no regional wall motion abnormalities. The left ventricular internal cavity size was normal in size. There is  mild left ventricular hypertrophy. Left ventricular diastolic parameters are consistent with Grade I diastolic dysfunction (impaired relaxation). Right Ventricle: The right ventricular size is normal. No increase in right ventricular wall thickness. Right ventricular systolic function is normal. Left Atrium: Left atrial size was moderately dilated. Right Atrium: Right atrial size was mildly dilated. Pericardium: There is no evidence of pericardial effusion. Mitral Valve: The mitral valve is normal in structure. No evidence of mitral valve regurgitation. No evidence of mitral valve stenosis. MV peak gradient, 3.4 mmHg. The mean mitral valve gradient is 1.0 mmHg. Tricuspid Valve: The tricuspid valve is normal in structure. Tricuspid valve regurgitation is trivial. No evidence of tricuspid stenosis. Aortic Valve: The aortic valve is normal in structure. Aortic valve regurgitation is not visualized. No aortic stenosis is present. Aortic valve mean gradient measures 3.0 mmHg. Aortic valve peak gradient measures 5.4 mmHg. Aortic valve area, by VTI measures 3.11 cm. Pulmonic Valve: The pulmonic valve was normal in structure. Pulmonic valve regurgitation is not visualized. No evidence of pulmonic stenosis. Aorta: The aortic root is normal in size and structure. Venous: The inferior vena cava is dilated in size with less than 50% respiratory variability, suggesting right atrial pressure of 15 mmHg. IAS/Shunts: No atrial level shunt detected by color flow Doppler.  LEFT VENTRICLE PLAX 2D LVIDd:         5.50 cm     Diastology LVIDs:         4.10 cm     LV e' medial:    4.35 cm/s LV PW:         1.10 cm     LV E/e' medial:  17.1 LV IVS:        1.20 cm     LV e' lateral:   7.18 cm/s LVOT diam:     2.20 cm     LV E/e' lateral: 10.4 LV SV:         55 LV SV Index:   24 LVOT Area:     3.80  cm  LV Volumes (MOD) LV vol d, MOD A2C: 95.2 ml LV vol d, MOD A4C: 72.4 ml LV vol s, MOD A2C: 29.7 ml LV vol s, MOD A4C: 33.7 ml LV SV MOD A2C:     65.5 ml LV SV MOD A4C:     72.4 ml LV SV MOD BP:      52.2 ml RIGHT VENTRICLE          IVC RV Basal diam:  4.50 cm  IVC diam: 2.60 cm TAPSE (M-mode): 2.4 cm LEFT ATRIUM              Index        RIGHT ATRIUM           Index LA Vol (A2C):   89.6 ml  39.38 ml/m  RA Area:     20.00 cm LA Vol (A4C):   100.0 ml 43.95 ml/m  RA Volume:   62.90 ml  27.65 ml/m LA Biplane Vol: 94.4 ml  41.49 ml/m  AORTIC VALVE                    PULMONIC VALVE AV Area (Vmax):    2.95 cm     PV Vmax:  0.63 m/s AV Area (Vmean):   3.12 cm     PV Peak grad:  1.6 mmHg AV Area (VTI):     3.11 cm AV Vmax:           116.00 cm/s AV Vmean:          77.400 cm/s AV VTI:            0.176 m AV Peak Grad:      5.4 mmHg AV Mean Grad:      3.0 mmHg LVOT Vmax:         90.10 cm/s LVOT Vmean:        63.600 cm/s LVOT VTI:          0.144 m LVOT/AV VTI ratio: 0.82 MITRAL VALVE               TRICUSPID VALVE MV Area (PHT): 3.05 cm    TR Peak grad:   18.0 mmHg MV Area VTI:   3.01 cm    TR Vmax:        212.00 cm/s MV Peak grad:  3.4 mmHg MV Mean grad:  1.0 mmHg    SHUNTS MV Vmax:       0.92 m/s    Systemic VTI:  0.14 m MV Vmean:      55.2 cm/s   Systemic Diam: 2.20 cm MV Decel Time: 249 msec MV E velocity: 74.40 cm/s MV A velocity: 83.20 cm/s MV E/A ratio:  0.89 Arvilla Meres MD Electronically signed by Arvilla Meres MD Signature Date/Time: 12/12/2023/11:52:47 AM    Final    CT HEAD WO CONTRAST ( ) Result Date: 12/11/2023 CLINICAL DATA:  Mental status, intubation. EXAM: CT HEAD WITHOUT CONTRAST TECHNIQUE: Contiguous axial images were obtained from the base of the skull through the vertex without intravenous contrast. RADIATION DOSE REDUCTION: This exam was performed according to the departmental dose-optimization program which includes automated exposure control, adjustment of the mA and/or kV  according to patient size and/or use of iterative reconstruction technique. COMPARISON:  Head CT 12/10/2019 and CTA head and neck. FINDINGS: Brain: No evidence of acute infarction, hemorrhage, hydrocephalus, extra-axial collection or mass lesion/mass effect. There is mild cerebral atrophy and small-vessel disease. Dystrophic benign calcifications in the frontal falx. Unremarkable brainstem and cerebellum. Vascular: Patchy calcifications in the siphons and both distal vertebral arteries. No hyperdense central vasculature is seen. Skull: Negative for fractures or focal lesions. Sinuses/Orbits: 2 cm retention cyst or polyp inferiorly in the right maxillary sinus. Patchy opacification of the ethmoid air cells. Other sinuses, bilateral mastoid air cells and middle ears are clear. Midline nasal septum. Bilateral symmetric proptosis. This was seen previously. No extraocular muscle thickening. Other: Partially visible orotracheal intubation. Partially visible oroenteric intubation. IMPRESSION: 1. No acute intracranial CT findings or interval changes. 2. Chronic changes. 3. Sinus disease. 4. Bilateral symmetric proptosis. Electronically Signed   By: Almira Bar M.D.   On: 12/11/2023 05:35   DG Chest Port 1 View Result Date: 12/11/2023 CLINICAL DATA:  [id number]. ETT with ventilator dependent respiratory failure. [id number].  Encounter for orogastric tube placement. EXAM: PORTABLE CHEST 1 VIEW PORTABLE ABDOMEN 1 VIEW COMPARISON:  AP and lateral chest 11/27/2023, CT abdomen pelvis no contrast 12/02/2023 FINDINGS: Chest AP portable, one view at 1:43 a.m.: ETT tip is 3.1 cm from the carina. Overlying monitor wiring. The right CP sulcus was excluded from the exam. There is wall mild cardiomegaly without evidence of CHF. There is no substantial pleural effusion.  The lungs are clear. There  is aortic atherosclerosis, tortuosity and ectasia with stable mediastinum. No new osseous findings.  Moderate thoracic spondylosis. Abdomen single  view supine, 1:46 a.m.: NGT extends to the left in the stomach with the tip along the proximal fundal wall. There are cholecystectomy clips. The bowel pattern is nonobstructive with moderate retained stool. No supine evidence of free air. There is a caval filter L5 level and a dorsal fusion construct spanning L3-5. There is degenerative change of the thoracic and lumbar spine. IMPRESSION: 1. ETT tip 3.1 cm from the carina. 2. NGT tip along the proximal fundal wall. 3. No evidence of acute chest or abdominal process. 4. Aortic atherosclerosis, tortuosity and ectasia. 5. Nonobstructive bowel pattern with moderate retained stool. Electronically Signed   By: Almira Bar M.D.   On: 12/11/2023 03:28   DG Abd Portable 1V Result Date: 12/11/2023 CLINICAL DATA:  [id number]. ETT with ventilator dependent respiratory failure. [id number].  Encounter for orogastric tube placement. EXAM: PORTABLE CHEST 1 VIEW PORTABLE ABDOMEN 1 VIEW COMPARISON:  AP and lateral chest 11/27/2023, CT abdomen pelvis no contrast 12/02/2023 FINDINGS: Chest AP portable, one view at 1:43 a.m.: ETT tip is 3.1 cm from the carina. Overlying monitor wiring. The right CP sulcus was excluded from the exam. There is wall mild cardiomegaly without evidence of CHF. There is no substantial pleural effusion.  The lungs are clear. There is aortic atherosclerosis, tortuosity and ectasia with stable mediastinum. No new osseous findings.  Moderate thoracic spondylosis. Abdomen single view supine, 1:46 a.m.: NGT extends to the left in the stomach with the tip along the proximal fundal wall. There are cholecystectomy clips. The bowel pattern is nonobstructive with moderate retained stool. No supine evidence of free air. There is a caval filter L5 level and a dorsal fusion construct spanning L3-5. There is degenerative change of the thoracic and lumbar spine. IMPRESSION: 1. ETT tip 3.1 cm from the carina. 2. NGT tip along the proximal fundal wall. 3. No evidence of acute  chest or abdominal process. 4. Aortic atherosclerosis, tortuosity and ectasia. 5. Nonobstructive bowel pattern with moderate retained stool. Electronically Signed   By: Almira Bar M.D.   On: 12/11/2023 03:28   CT ABDOMEN PELVIS WO CONTRAST Result Date: 12/05/2023 CLINICAL DATA:  Splenic lesion six-month follow-up, patient asymptomatic EXAM: CT ABDOMEN AND PELVIS WITHOUT CONTRAST TECHNIQUE: Multidetector CT imaging of the abdomen and pelvis was performed following the standard protocol without IV contrast. RADIATION DOSE REDUCTION: This exam was performed according to the departmental dose-optimization program which includes automated exposure control, adjustment of the mA and/or kV according to patient size and/or use of iterative reconstruction technique. COMPARISON:  CT chest June 02, 2023, February 15, 2023 FINDINGS: Lower chest: No acute abnormality. Hepatobiliary: No focal liver abnormality is seen. Status post cholecystectomy. No biliary dilatation. Pancreas: Unremarkable. No pancreatic ductal dilatation or surrounding inflammatory changes. Spleen: Normal in size without focal abnormality. No splenic lesions are identified. The previously described low-attenuation 16 mm splenic lesion of the prior enhanced CT is not identified on this nonenhanced CT. Adrenals/Urinary Tract: Adrenal glands are unremarkable. Kidneys are normal, without renal calculi, focal lesion, or hydronephrosis. Bladder is unremarkable. Stomach/Bowel: Stomach is within normal limits. Appendix appears normal. No evidence of bowel wall thickening, distention, or inflammatory changes. Abundant residual fecal material throughout the colon. No inflammatory changes no appendicitis no diverticulitis with diffuse diverticulosis of the rectosigmoid colon. Vascular/Lymphatic: Aortic atherosclerosis. No enlarged abdominal or pelvic lymph nodes. IVC filter in the inferior vena cava unchanged since prior  examination. Reproductive: Prostate is  unremarkable. Other: Prior left inguinal hernia repair. Musculoskeletal: No acute or significant osseous findings. Postsurgical changes lumbosacral spine transpedicular fixation L3-L5. IVC filter in the inferior vena cava IMPRESSION: *No acute abnormality of the abdomen or pelvis. *The previously described low-attenuation 16 mm splenic lesion of the prior enhanced CT is not identified on this nonenhanced CT. *Abundant residual fecal material throughout the colon. *Diverticulosis of the rectosigmoid colon without diverticulitis. *Aortic atherosclerosis. Electronically Signed   By: Shaaron Adler M.D.   On: 12/05/2023 17:51   DG Chest 2 View Result Date: 11/27/2023 CLINICAL DATA:  tobacco use, h/o renal mass, hypertensive urgency, acute r sided back pain Tobacco dependence. EXAM: CHEST - 2 VIEW COMPARISON:  Chest radiograph 04/05/2023 FINDINGS: The heart is normal in size. Stable aortic tortuosity and atherosclerosis. No evidence of focal airspace disease or pulmonary nodule. Normal pulmonary vasculature. No pleural effusion or pneumothorax. Thoracic spondylosis. No acute osseous abnormalities are seen. IMPRESSION: 1. No acute chest findings. 2. Aortic Atherosclerosis (ICD10-I70.0). Electronically Signed   By: Narda Rutherford M.D.   On: 11/27/2023 15:46     Discharge Exam: Vitals:   12/15/23 0851 12/15/23 1245  BP:  123/78  Pulse:  (!) 107  Resp:  18  Temp:  98.8 F (37.1 C)  SpO2: 100% 98%   Vitals:   12/15/23 0500 12/15/23 0732 12/15/23 0851 12/15/23 1245  BP:  (!) 155/89  123/78  Pulse:  100  (!) 107  Resp:  18  18  Temp:  98.6 F (37 C)  98.8 F (37.1 C)  TempSrc:  Oral  Oral  SpO2:  96% 100% 98%  Weight: 104.7 kg     Height:        General: Pt is alert, awake, not in acute distress Cardiovascular: RRR, S1/S2 +, no rubs, no gallops Respiratory: CTA bilaterally, no wheezing, no rhonchi Abdominal: Soft, NT, ND, bowel sounds + Extremities: no edema, no cyanosis    The results of  significant diagnostics from this hospitalization (including imaging, microbiology, ancillary and laboratory) are listed below for reference.     Microbiology: Recent Results (from the past 240 hours)  Respiratory (~20 pathogens) panel by PCR     Status: None   Collection Time: 12/11/23  3:16 AM   Specimen: Anterior Nasal Swab; Respiratory  Result Value Ref Range Status   Adenovirus NOT DETECTED NOT DETECTED Final   Coronavirus 229E NOT DETECTED NOT DETECTED Final    Comment: (NOTE) The Coronavirus on the Respiratory Panel, DOES NOT test for the novel  Coronavirus (2019 nCoV)    Coronavirus HKU1 NOT DETECTED NOT DETECTED Final   Coronavirus NL63 NOT DETECTED NOT DETECTED Final   Coronavirus OC43 NOT DETECTED NOT DETECTED Final   Metapneumovirus NOT DETECTED NOT DETECTED Final   Rhinovirus / Enterovirus NOT DETECTED NOT DETECTED Final   Influenza A NOT DETECTED NOT DETECTED Final   Influenza B NOT DETECTED NOT DETECTED Final   Parainfluenza Virus 1 NOT DETECTED NOT DETECTED Final   Parainfluenza Virus 2 NOT DETECTED NOT DETECTED Final   Parainfluenza Virus 3 NOT DETECTED NOT DETECTED Final   Parainfluenza Virus 4 NOT DETECTED NOT DETECTED Final   Respiratory Syncytial Virus NOT DETECTED NOT DETECTED Final   Bordetella pertussis NOT DETECTED NOT DETECTED Final   Bordetella Parapertussis NOT DETECTED NOT DETECTED Final   Chlamydophila pneumoniae NOT DETECTED NOT DETECTED Final   Mycoplasma pneumoniae NOT DETECTED NOT DETECTED Final    Comment: Performed at [facility], [address], [place], [place] [address]  SARS Coronavirus 2 by RT PCR (hospital order, performed in [facility] hospital lab) *cepheid single result test* Anterior Nasal Swab     Status: None   Collection Time: 12/11/23  3:16 AM   Specimen: Anterior Nasal Swab  Result Value Ref Range Status   SARS Coronavirus 2 by RT PCR NEGATIVE NEGATIVE Final    Comment: Performed at [facility], [address] [place], [place] [address]  MRSA Next Gen by PCR, Nasal     Status: Abnormal   Collection Time: 12/11/23  8:01 AM   Specimen: Nasal Mucosa; Nasal Swab  Result Value Ref Range Status   MRSA by PCR Next Gen DETECTED (A) NOT DETECTED Final    Comment: RESULT CALLED TO, READ BACK BY AND VERIFIED WITH: RN Rodman Key on [phone]  by SM (NOTE) The GeneXpert MRSA Assay (FDA approved for NASAL specimens only), is one component of a comprehensive MRSA colonization surveillance program. It is not intended to diagnose MRSA infection nor to guide or monitor treatment for MRSA infections. Test performance is not FDA approved in patients less than 43 years old. Performed at [facility], [address], [place], [place] [address]      Labs: BNP (last 3 results) Recent Labs    12/11/23 0135  BNP 90.6   Basic Metabolic Panel: Recent Labs  Lab 12/11/23 0135 12/11/23 0223 12/11/23 0623 12/11/23 0834 12/11/23 0926 12/12/23 0433 12/15/23 0631  NA 140 141  140  --   --  140 141 138  K 4.2 4.1  4.1  --   --  4.1 3.8 4.5  CL 104 103  --   --   --  106 104  CO2 28  --   --   --   --  25 23  GLUCOSE 245* 245*  --   --   --  115* 123*  BUN 12 13  --   --   --  19 13  CREATININE 0.99 1.00 1.01  --   --  1.06 0.79  CALCIUM 9.1  --   --   --   --  8.9 9.1  MG  --   --   --  2.2  --  2.1  --   PHOS  --   --   --  3.5  --   --   --    Liver Function Tests: Recent Labs  Lab 12/11/23 0135 12/15/23 0631  AST 35 16  ALT 25 18  ALKPHOS 52 35*  BILITOT 0.4 0.4  PROT 7.1 5.9*  ALBUMIN 4.0 3.3*   No results for input(s): "LIPASE", "AMYLASE" in the last 168 hours. No results for input(s): "AMMONIA" in the last 168 hours. CBC: Recent Labs  Lab 12/11/23 0135 12/11/23 0223 12/11/23 0623 12/11/23 0926 12/12/23 0433 12/15/23 0631  WBC 7.7  --  8.8  --  6.9 6.5  NEUTROABS 2.9  --   --   --   --   --   HGB 13.5 15.3  15.3 12.0* 14.3 11.6* 12.3*  HCT 44.2 45.0  45.0 38.3* 42.0 35.2*  38.4*  MCV 73.4*  --  71.3*  --  68.6* 70.6*  PLT 294  --  229  --  202 227   Cardiac Enzymes: No results for input(s): "CKTOTAL", "CKMB", "CKMBINDEX", "TROPONINI" in the last 168 hours. BNP: Invalid input(s): "POCBNP" CBG: Recent Labs  Lab  12/13/23 0045 12/13/23 0417 12/13/23 0819 12/13/23 1152 12/13/23 1546  GLUCAP 109* 81 92 94 118*   D-Dimer No results for input(s): "DDIMER" in the last 72 hours. Hgb A1c No results for input(s): "HGBA1C" in the last 72 hours. Lipid Profile No results for input(s): "CHOL", "HDL", "LDLCALC", "TRIG", "CHOLHDL", "LDLDIRECT" in the last 72 hours. Thyroid function studies No results for input(s): "TSH", "T4TOTAL", "T3FREE", "THYROIDAB" in the last 72 hours.  Invalid input(s): "FREET3" Anemia work up No results for input(s): "VITAMINB12", "FOLATE", "FERRITIN", "TIBC", "IRON", "RETICCTPCT" in the last 72 hours. Urinalysis    Component Value Date/Time   COLORURINE STRAW (A) 12/11/2023 0135   APPEARANCEUR CLEAR 12/11/2023 0135   APPEARANCEUR Clear 07/07/2023 1052   LABSPEC 1.010 12/11/2023 0135   PHURINE 6.0 12/11/2023 0135   GLUCOSEU 150 (A) 12/11/2023 0135   HGBUR SMALL (A) 12/11/2023 0135   BILIRUBINUR NEGATIVE 12/11/2023 0135   BILIRUBINUR neg 11/27/2023 1429   BILIRUBINUR Negative 07/07/2023 1052   KETONESUR NEGATIVE 12/11/2023 0135   PROTEINUR 100 (A) 12/11/2023 0135   UROBILINOGEN 0.2 11/27/2023 1429   UROBILINOGEN 0.2 07/26/2014 1653   NITRITE NEGATIVE 12/11/2023 0135   LEUKOCYTESUR NEGATIVE 12/11/2023 0135   Sepsis Labs Recent Labs  Lab 12/11/23 0135 12/11/23 0623 12/12/23 0433 12/15/23 0631  WBC 7.7 8.8 6.9 6.5   Microbiology Recent Results (from the past 240 hours)  Respiratory (~20 pathogens) panel by PCR     Status: None   Collection Time: 12/11/23  3:16 AM   Specimen: Anterior Nasal Swab; Respiratory  Result Value Ref Range Status   Adenovirus NOT DETECTED NOT DETECTED Final   Coronavirus 229E NOT DETECTED NOT  DETECTED Final    Comment: (NOTE) The Coronavirus on the Respiratory Panel, DOES NOT test for the novel  Coronavirus (2019 nCoV)    Coronavirus HKU1 NOT DETECTED NOT DETECTED Final   Coronavirus NL63 NOT DETECTED NOT DETECTED Final   Coronavirus OC43 NOT DETECTED NOT DETECTED Final   Metapneumovirus NOT DETECTED NOT DETECTED Final   Rhinovirus / Enterovirus NOT DETECTED NOT DETECTED Final   Influenza A NOT DETECTED NOT DETECTED Final   Influenza B NOT DETECTED NOT DETECTED Final   Parainfluenza Virus 1 NOT DETECTED NOT DETECTED Final   Parainfluenza Virus 2 NOT DETECTED NOT DETECTED Final   Parainfluenza Virus 3 NOT DETECTED NOT DETECTED Final   Parainfluenza Virus 4 NOT DETECTED NOT DETECTED Final   Respiratory Syncytial Virus NOT DETECTED NOT DETECTED Final   Bordetella pertussis NOT DETECTED NOT DETECTED Final   Bordetella Parapertussis NOT DETECTED NOT DETECTED Final   Chlamydophila pneumoniae NOT DETECTED NOT DETECTED Final   Mycoplasma pneumoniae NOT DETECTED NOT DETECTED Final    Comment: Performed at [facility], [address], [place], [place] [address]  SARS Coronavirus 2 by RT PCR (hospital order, performed in [facility] hospital lab) *cepheid single result test* Anterior Nasal Swab     Status: None   Collection Time: 12/11/23  3:16 AM   Specimen: Anterior Nasal Swab  Result Value Ref Range Status   SARS Coronavirus 2 by RT PCR NEGATIVE NEGATIVE Final    Comment: Performed at [facility], [address] [place], [place] [address]  MRSA Next Gen by PCR, Nasal     Status: Abnormal   Collection Time: 12/11/23  8:01 AM   Specimen: Nasal Mucosa; Nasal Swab  Result Value Ref Range Status   MRSA by PCR Next Gen DETECTED (A) NOT DETECTED Final  Comment: RESULT CALLED TO, READ BACK BY AND VERIFIED WITH: RN Rodman Key on [phone] @[phone]  by SM (NOTE) The GeneXpert MRSA Assay (FDA approved for NASAL specimens only), is one component of a comprehensive MRSA  colonization surveillance program. It is not intended to diagnose MRSA infection nor to guide or monitor treatment for MRSA infections. Test performance is not FDA approved in patients less than 54 years old. Performed at [facility], [address], [place], [place] [address]     FURTHER DISCHARGE INSTRUCTIONS:   Get Medicines reviewed and adjusted: Please take all your medications with you for your next visit with your Primary MD   Laboratory/radiological data: Please request your Primary MD to go over all hospital tests and procedure/radiological results at the follow up, please ask your Primary MD to get all Hospital records sent to his/her office.   In some cases, they will be blood work, cultures and biopsy results pending at the time of your discharge. Please request that your primary care M.D. goes through all the records of your hospital data and follows up on these results.   Also Note the following: If you experience worsening of your admission symptoms, develop shortness of breath, life threatening emergency, suicidal or homicidal thoughts you must seek medical attention immediately by calling 911 or calling your MD immediately  if symptoms less severe.   You must read complete instructions/literature along with all the possible adverse reactions/side effects for all the Medicines you take and that have been prescribed to you. Take any new Medicines after you have completely understood and accpet all the possible adverse reactions/side effects.    Do not drive when taking Pain medications or sleeping medications (Benzodaizepines)   Do not take more than prescribed Pain, Sleep and Anxiety Medications. It is not advisable to combine anxiety,sleep and pain medications without talking with your primary care practitioner   Special Instructions: If you have smoked or chewed Tobacco  in the last 2 yrs please stop smoking, stop any regular Alcohol  and or any Recreational drug  use.   Wear Seat belts while driving.   Please note: You were cared for by a hospitalist during your hospital stay. Once you are discharged, your primary care physician will handle any further medical issues. Please note that NO REFILLS for any discharge medications will be authorized once you are discharged, as it is imperative that you return to your primary care physician (or establish a relationship with a primary care physician if you do not have one) for your post hospital discharge needs so that they can reassess your need for medications and monitor your lab values  Time coordinating discharge: Over 30 minutes  SIGNED:   Hughie Closs, MD  [facility] 12/15/2023, 1:14 PM *Please note that this is a verbal dictation therefore any spelling or grammatical errors are due to the "Dragon Medical One" system interpretation. If 7PM-7AM, please contact night-coverage [email or web address]

## 2023-12-15 NOTE — Plan of Care (Signed)

## 2023-12-15 NOTE — Progress Notes (Signed)
 Transition of Care [facility]) - Inpatient Brief Assessment   Patient Details  Name: [name] [name] MRN: [id number] Date of Birth: 12-Jul-1951  Transition of Care [facility] CM/SW Contact:    Janae Bridgeman, RN Phone Number: 12/15/2023, 1:42 PM   Clinical Narrative: Patient admitted to the hospital for acute hypoxic respiratory failure.  Patient is on room air and should return home with no need for [facility] services.  No TOC needs and patient should discharge home.   Transition of Care Asessment: Insurance and Status: (P) Insurance coverage has been reviewed Patient has primary care physician: (P) Yes Home environment has been reviewed: (P) from home Prior level of function:: (P) Independent Prior/Current Home Services: (P) No current home services Social Drivers of Health Review: (P) SDOH reviewed no interventions necessary Readmission risk has been reviewed: (P) Yes Transition of care needs: (P) no transition of care needs at this time

## 2023-12-15 NOTE — Care Management Important Message (Signed)
 Important Message  Patient Details  Name: [name] [name] MRN: [id number] Date of Birth: [birth year]   Important Message Given:  Yes - Medicare IM     Dorena Bodo 12/15/2023, 11:11 AM

## 2023-12-15 NOTE — Evaluation (Signed)
 Occupational Therapy Evaluation Patient Details Name: NEEL BUFFONE MRN: [id number] DOB: 06/25/1951 Today's Date: 12/15/2023   History of Present Illness   73 yo male admitted 4/3 with respiratory distress, hypoxia and obtunded requiring intubation. 4/4 pt self-extubated. PMHx: HTN, COPD, CAD     Clinical Impressions PTA, pt lived with wife and daughter and was independent. Upon eval, pt initially appears to be independent walking to restroom to get dressed on arrival. Upon exit, pt with 3/4 DOE, reported dizziness, and one LOB on way back to chair but able to self correct. After safely assisted to sit, pt VSS. Monitored vitals during LB Adl and mobility and pt audibly wheezing and pt with 2/4 DOE, but VSS and SpO2 reading remain >90. Educated regarding energy conservation and self pacing with wife and daughter present and pt needing up to min-mod cues to self implement during ADL. Will follow acutely, but do not suspect need for follow up OT after discharge; pt with fair awareness of self pacing and wife able to independent cue pt.      If plan is discharge home, recommend the following:   A little help with walking and/or transfers;A little help with bathing/dressing/bathroom;Assistance with cooking/housework;Assist for transportation;Help with stairs or ramp for entrance     Functional Status Assessment   Patient has had a recent decline in their functional status and demonstrates the ability to make significant improvements in function in a reasonable and predictable amount of time.     Equipment Recommendations   None recommended by OT     Recommendations for Other Services         Precautions/Restrictions   Precautions Precautions: None Recall of Precautions/Restrictions: Intact Restrictions Weight Bearing Restrictions Per Provider Order: No     Mobility Bed Mobility                    Transfers Overall transfer level: Independent                         Balance                                           ADL either performed or assessed with clinical judgement   ADL Overall ADL's : Needs assistance/impaired Eating/Feeding: Independent   Grooming: Independent;Standing   Upper Body Bathing: Independent   Lower Body Bathing: Supervison/ safety   Upper Body Dressing : Independent   Lower Body Dressing: Supervision/safety   Toilet Transfer: Modified Independent           Functional mobility during ADLs: Supervision/safety (for longer distances)       Vision Patient Visual Report: No change from baseline Vision Assessment?: No apparent visual deficits     Perception         Praxis         Pertinent Vitals/Pain Pain Assessment Pain Assessment: Faces Faces Pain Scale: Hurts little more Pain Location: back, HA Pain Descriptors / Indicators: Aching Pain Intervention(s): Limited activity within patient's tolerance, Monitored during session     Extremity/Trunk Assessment Upper Extremity Assessment Upper Extremity Assessment: Overall WFL for tasks assessed   Lower Extremity Assessment Lower Extremity Assessment: Overall WFL for tasks assessed       Communication Communication Communication: No apparent difficulties   Cognition Arousal: Alert Behavior During Therapy: WFL for tasks assessed/performed Cognition: No apparent impairments  Following commands: Intact       Cueing  General Comments   Cueing Techniques: Verbal cues  Pt going into restroom on arrival walking to restroom independently. On exit, pt SOB with audible wheeze. OT obtains dynamap and pt VSS; completed second amulatory walk test and monitored HR/BP and all WFL with exception of resting heart rate 107 slightly elevated. Spent session focused on eductaion regarding energy conservation and pt needing up to min-mod cues for functional implementation   Exercises      Shoulder Instructions      Home Living Family/patient expects to be discharged to:: Private residence Living Arrangements: Spouse/significant other;Children Available Help at Discharge: Family Type of Home: Apartment Home Access: Level entry     Home Layout: One level     Bathroom Shower/Tub: Chief Strategy Officer: Standard     Home Equipment: None          Prior Functioning/Environment Prior Level of Function : Independent/Modified Independent;History of Falls (last six months)               ADLs Comments: pt is independent in ADL and IADL    OT Problem List: Decreased activity tolerance;Cardiopulmonary status limiting activity   OT Treatment/Interventions: Self-care/ADL training;Therapeutic exercise;DME and/or AE instruction;Energy conservation;Patient/family education;Balance training;Therapeutic activities      OT Goals(Current goals can be found in the care plan section)   Acute Rehab OT Goals Patient Stated Goal: get better OT Goal Formulation: With patient Time For Goal Achievement: 12/29/23 Potential to Achieve Goals: Good   OT Frequency:  Min 1X/week    Co-evaluation              AM-PAC OT "6 Clicks" Daily Activity     Outcome Measure Help from another person eating meals?: None Help from another person taking care of personal grooming?: None Help from another person toileting, which includes using toliet, bedpan, or urinal?: None Help from another person bathing (including washing, rinsing, drying)?: A Little Help from another person to put on and taking off regular upper body clothing?: None Help from another person to put on and taking off regular lower body clothing?: A Little 6 Click Score: 22   End of Session Equipment Utilized During Treatment: Gait belt Nurse Communication: Mobility status;Other (comment) (wheezing)  Activity Tolerance: Patient tolerated treatment well Patient left: in chair;with call bell/phone  within reach;with family/visitor present  OT Visit Diagnosis: Other (comment) (decreased activity tolerance, poor cardiopulmonary endurance)                Time: 1325-1350 OT Time Calculation (min): 25 min Charges:  OT General Charges $OT Visit: 1 Visit OT Evaluation $OT Eval Low Complexity: 1 Low OT Treatments $Self Care/Home Management : 8-22 mins  Tyler Deis, OTR/L [facility] Office: [phone]   Myrla Halsted 12/15/2023, 2:24 PM

## 2023-12-16 ENCOUNTER — Other Ambulatory Visit: Payer: Self-pay

## 2023-12-16 ENCOUNTER — Other Ambulatory Visit (HOSPITAL_COMMUNITY): Payer: Self-pay

## 2023-12-16 ENCOUNTER — Telehealth: Payer: Self-pay

## 2023-12-16 NOTE — Transitions of Care (Post Inpatient/ED Visit) (Signed)
 12/16/2023  Name: [name] [name] MRN: [id number] DOB: 1951-07-27  Today's TOC FU Call Status: Today's TOC FU Call Status:: Successful TOC FU Call Completed TOC FU Call Complete Date: 12/16/23 Patient's Name and Date of Birth confirmed.  Transition Care Management Follow-up Telephone Call Date of Discharge: 12/15/23 Type of Discharge: Inpatient Admission Primary Inpatient Discharge Diagnosis:: Acute hypoxic respiratory failure How have you been since you were released from the hospital?: Same (Continues to be short of breath) Any questions or concerns?: No  Items Reviewed: Did you receive and understand the discharge instructions provided?: Yes Medications obtained,verified, and reconciled?: Yes (Medications Reviewed) Any new allergies since your discharge?: No Dietary orders reviewed?: No Do you have support at home?: Yes People in Home [RPT]: child(ren), adult, spouse  Medications Reviewed Today: Medications Reviewed Today     Reviewed by Earlie Server, RN (Registered Nurse) on 12/16/23 at 1022  Med List Status: <None>   Medication Order Taking? Sig Documenting Provider Last Dose Status Informant  albuterol (PROVENTIL HFA;VENTOLIN HFA) 108 (90 Base) MCG/ACT inhaler [id number] Yes Inhale 2 puffs into the lungs every 6 (six) hours as needed for wheezing or shortness of breath. Derwood Kaplan, MD Taking Active Spouse/Significant Other           Med Note Nedra Hai, Standley Dakins Dec 11, 2023 12:59 PM) Patient's spouse states that the patient took 3 puffs of this medication on 04.02.2025  albuterol (PROVENTIL) (2.5 MG/3ML) 0.083% nebulizer solution [id number] Yes Take 2.5 mg by nebulization every 6 (six) hours as needed for wheezing or shortness of breath. [provider] Taking Active Spouse/Significant Other  alfuzosin (UROXATRAL) 10 MG 24 hr tablet [id number] Yes Take 1 tablet (10 mg total) by mouth every morning [name], MD Taking Active Spouse/Significant Other   ARIPiprazole (ABILIFY) 10 MG tablet [id number] Yes Take 1 tablet (10 mg total) by mouth at bedtime. [name], MD Taking Active Spouse/Significant Other  atorvastatin (LIPITOR) 20 MG tablet [id number] Yes Take 1 tablet (20 mg total) by mouth in the morning [name], MD Taking Active Spouse/Significant Other  BREO ELLIPTA 100-25 MCG/ACT AEPB [id number] Yes Inhale 1 puff into the lungs daily. [provider] Taking Active Spouse/Significant Other  Budeson-Glycopyrrol-Formoterol (BREZTRI AEROSPHERE) 160-9-4.8 MCG/ACT AERO [id number] No Inhale 2 puffs into the lungs 2 (two) times daily.  Patient not taking: Reported on 12/11/2023   [name], MD Not Taking Active Spouse/Significant Other  cyclobenzaprine (FLEXERIL) 10 MG tablet [id number] Yes Take 1 tablet (10 mg total) by mouth 3 (three) times daily as needed for muscle spasms. [name], MD Taking Active Spouse/Significant Other  DULoxetine (CYMBALTA) 20 MG capsule [id number] Yes Take 1 capsule (20 mg total) by mouth every morning. [name], MD Taking Active Spouse/Significant Other           Med Note Nedra Hai, Standley Dakins Dec 11, 2023  1:00 PM) Patient takes 20mg  capsule along with 60mg  capsule for a total of 80mg  once daily.  DULoxetine (CYMBALTA) 60 MG capsule [id number] Yes Take 1 capsule (60 mg total) by mouth every morning. To equal 80 mg daily [name], MD Taking Active Spouse/Significant Other           Med Note Nedra Hai, Standley Dakins Dec 11, 2023  1:00 PM) Patient takes 60mg  capsule along with 20mg  capsule for a total of 80mg  once daily.  GELSYN-3 16.8 MG/2ML SOSY intra-articular injection [id number] Yes  [provider] Taking Active Spouse/Significant Other  hydrochlorothiazide (HYDRODIURIL) 25 MG tablet [id number] Yes Take 1 tablet (25 mg total) by mouth every morning. [name], MD Taking Active Spouse/Significant Other  nicotine (NICODERM CQ - DOSED IN MG/24 HOURS) 14  mg/24hr patch [id number] Yes Place 1 patch (14 mg total) onto the skin daily. [name], MD Taking Active Spouse/Significant Other  nicotine (NICODERM CQ - DOSED IN MG/24 HOURS) 21 mg/24hr patch [id number] Yes Place 1 patch (21 mg total) onto the skin daily. [name], MD Taking Active Spouse/Significant Other  nicotine (NICODERM CQ - DOSED IN MG/24 HR) 7 mg/24hr patch [id number] Yes Place 1 patch (7 mg total) onto the skin daily. [name], MD Taking Active Spouse/Significant Other  NIFEdipine (PROCARDIA XL/NIFEDICAL XL) 60 MG 24 hr tablet [id number] Yes Take 2 tablets (120 mg total) by mouth at bedtime. [name], MD Taking Active Spouse/Significant Other  olmesartan (BENICAR) 40 MG tablet [id number] Yes Take 1 tablet (40 mg total) by mouth at bedtime. [name], MD Taking Active Spouse/Significant Other  Oxycodone HCl 10 MG TABS [id number] Yes Take 10 mg by mouth in the morning, at noon, in the evening, and at bedtime. [provider] Taking Active Spouse/Significant Other           Med Note (CRUTHIS, CHLOE C   Thu Dec 11, 2023  8:46 AM)    pantoprazole (PROTONIX) 40 MG tablet [id number] Yes Take 1 tablet (40 mg total) by mouth every morning [name], MD Taking Active Spouse/Significant Other  predniSONE (DELTASONE) 50 MG tablet [id number]  Take 1 tablet (50 mg total) by mouth daily with breakfast for 3 days. Hughie Closs, MD  Active   rivaroxaban (XARELTO) 20 MG TABS tablet [id number] Yes Take 1 tablet (20 mg total) by mouth at bedtime. [name], MD Taking Active Spouse/Significant Other  spironolactone (ALDACTONE) 25 MG tablet [id number] Yes Take 0.5 tablets (12.5 mg total) by mouth every morning. [name], MD Taking Active Spouse/Significant Other           Med Note Nedra Hai, Standley Dakins Dec 11, 2023  1:01 PM) Per patient's wife, the patient has not started this medication yet.            Home Care and  Equipment/Supplies: Were Home Health Services Ordered?: NA Any new equipment or medical supplies ordered?: NA  Functional Questionnaire: Do you need assistance with bathing/showering or dressing?: No Do you need assistance with meal preparation?: No Do you need assistance with eating?: No Do you have difficulty maintaining continence: No Do you need assistance with getting out of bed/getting out of a chair/moving?: Yes (difficulty getting up from chair. Uses arms) Do you have difficulty managing or taking your medications?: No  Follow up appointments reviewed: PCP Follow-up appointment confirmed?: No (will call today to make an appointment) MD Provider Line Number:[phone] Given: No Specialist Hospital Follow-up appointment confirmed?: No Do you need transportation to your follow-up appointment?: No Do you understand care options if your condition(s) worsen?: Yes-patient verbalized understanding  SDOH Interventions Today    Flowsheet Row Most Recent Value  SDOH Interventions   Food Insecurity Interventions Intervention Not Indicated  Housing Interventions Other (Comment)  [reports that he is ok with housing right now.]  Transportation Interventions Intervention Not Indicated  Utilities Interventions Intervention Not Indicated      Assessments completed please see assessment tab.  Spoke with patient who reports that he continues to  be short of breath, Has not gotten his prednisone yet but will do so today.  Reports last night he felt like he was tired and almost went to the hospital. States that he is using his inhalers and nebs. Reports that his breathing is some better today but patient sounds short of breath while talking on the phone.   Patient reports that he is doing well. Denies any additional needs today and patient willing to write down my phone number and call me if he has needs.  Lonia Chimera, RN, BSN, CEN [employer]- Transition of Care Team.  [facility] [phone]

## 2023-12-18 ENCOUNTER — Other Ambulatory Visit: Payer: Self-pay

## 2023-12-19 ENCOUNTER — Inpatient Hospital Stay: Admitting: Family Medicine

## 2023-12-19 DIAGNOSIS — M170 Bilateral primary osteoarthritis of knee: Secondary | ICD-10-CM | POA: Diagnosis not present

## 2023-12-29 ENCOUNTER — Other Ambulatory Visit: Payer: Self-pay

## 2023-12-29 ENCOUNTER — Other Ambulatory Visit (INDEPENDENT_AMBULATORY_CARE_PROVIDER_SITE_OTHER)

## 2023-12-29 DIAGNOSIS — I10 Essential (primary) hypertension: Secondary | ICD-10-CM

## 2023-12-29 NOTE — Progress Notes (Signed)
   12/29/2023  Patient ID: [name] [name], male   DOB: 1951/05/05, 73 y.o.   MRN: [id number]  Contacted patient by telephone to follow up on recent enrollment in medication adherence packaging and hypertension.  Patient reports he started the pill packaging boxes upon discharge from the hospital.  Patient reports he is aware that some medications were left out of the first packaging dispensing because they were too soon to refill (had been dispensed and picked up from [facility] recently).  Patient states he has been trying to check his BP daily. Reports yesterday was 140/72. He is not sure if he started spironolactone  yet but will check his bottles.  Limited appt for med review due to patient being present for phone instead of in office.  Reminded patient of PCP appt next week. Scheduled an IN OFFICE appt for 1 month f/u post PCP visit and patient instructed to bring all meds with him, including any remaining vials and pill packaging box.  Carnell Christian, PharmD Clinical Pharmacist [phone]

## 2023-12-30 ENCOUNTER — Other Ambulatory Visit: Payer: Self-pay | Admitting: Family Medicine

## 2023-12-30 DIAGNOSIS — M5416 Radiculopathy, lumbar region: Secondary | ICD-10-CM

## 2023-12-30 DIAGNOSIS — G6289 Other specified polyneuropathies: Secondary | ICD-10-CM

## 2024-01-06 ENCOUNTER — Other Ambulatory Visit: Payer: Self-pay

## 2024-01-06 ENCOUNTER — Ambulatory Visit (INDEPENDENT_AMBULATORY_CARE_PROVIDER_SITE_OTHER): Admitting: Family Medicine

## 2024-01-06 ENCOUNTER — Encounter: Payer: Self-pay | Admitting: Family Medicine

## 2024-01-06 ENCOUNTER — Other Ambulatory Visit (HOSPITAL_COMMUNITY): Payer: Self-pay

## 2024-01-06 VITALS — BP 160/80 | HR 85 | Temp 97.9°F | Ht 76.0 in | Wt 228.3 lb

## 2024-01-06 DIAGNOSIS — I10 Essential (primary) hypertension: Secondary | ICD-10-CM

## 2024-01-06 DIAGNOSIS — Z111 Encounter for screening for respiratory tuberculosis: Secondary | ICD-10-CM

## 2024-01-06 DIAGNOSIS — R2241 Localized swelling, mass and lump, right lower limb: Secondary | ICD-10-CM

## 2024-01-06 DIAGNOSIS — M5416 Radiculopathy, lumbar region: Secondary | ICD-10-CM

## 2024-01-06 MED ORDER — METOPROLOL SUCCINATE ER 25 MG PO TB24: 25.0000 mg | ORAL_TABLET | Freq: Every day | ORAL | 0 refills | Status: DC

## 2024-01-06 MED ORDER — METOLAZONE 5 MG PO TABS: 5.0000 mg | ORAL_TABLET | Freq: Every day | ORAL | 2 refills | Status: AC | PRN

## 2024-01-06 MED ORDER — METHYLPREDNISOLONE 4 MG PO TBPK: ORAL_TABLET | ORAL | 0 refills | Status: DC

## 2024-01-06 MED ORDER — METOPROLOL SUCCINATE ER 25 MG PO TB24
25.0000 mg | ORAL_TABLET | Freq: Every day | ORAL | 0 refills | Status: DC
  Filled 2024-01-06: qty 90, 90d supply, fill #0
  Filled 2024-01-08: qty 30, 30d supply, fill #0
  Filled 2024-01-27 – 2024-02-17 (×3): qty 30, 30d supply, fill #1
  Filled 2024-03-08 – 2024-03-11 (×3): qty 30, 30d supply, fill #2

## 2024-01-06 NOTE — Progress Notes (Unsigned)
 Established Patient Office Visit  Subjective   Patient ID: [name] [name], male    DOB: May 06, 1951  Age: 73 y.o. MRN: [id number]  Chief Complaint  Patient presents with   Medical Management of Chronic Issues    Pt is here for follow up on his blood pressure today and he was recently admitted to the hospital for an acute COPD exacerbation. He was actually intubated for about a day and he rapidly improved and was discharged on the 7th.   HTN - chronic, BP remains elevated today, he reports that he is getting his pill packs now but sometimes forgets to take his medication. States he did take his medications today. States that he is still in a lot of pain, reports they did the "gel injections" in his knees BL but they are still very painful. Has appt with the ortho on may 19th. States that he is just "tired of being in pain". He reports that the shots in his knees only helped for about 2 weeks.   Peripheral edema-- pt is asking for a medication to help with the leg swelling. States that the other medication I gave him didn't work. States that he took "metoprolol " and that worked however I think he is referring to "metolazone" instead since metoprolol  is not a diuretic.     Current Outpatient Medications  Medication Instructions   albuterol  (PROVENTIL  HFA;VENTOLIN  HFA) 108 (90 Base) MCG/ACT inhaler 2 puffs, Inhalation, Every 6 hours PRN   albuterol  (PROVENTIL ) 2.5 mg, Every 6 hours PRN   alfuzosin  (UROXATRAL ) 10 MG 24 hr tablet Take 1 tablet (10 mg total) by mouth every morning   ARIPiprazole  (ABILIFY ) 10 mg, Oral, Daily at bedtime   atorvastatin  (LIPITOR) 20 MG tablet Take 1 tablet (20 mg total) by mouth in the morning   BREO ELLIPTA  100-25 MCG/ACT AEPB 1 puff, Daily   Budeson-Glycopyrrol-Formoterol (BREZTRI  AEROSPHERE) 160-9-4.8 MCG/ACT AERO 2 puffs, Inhalation, 2 times daily   cyclobenzaprine  (FLEXERIL ) 10 mg, Oral, 3 times daily PRN   DULoxetine  (CYMBALTA ) 60 mg, Oral, Every morning, To  equal 80 mg daily   DULoxetine  (CYMBALTA ) 20 mg, Oral, Every morning   GELSYN-3 16.8 MG/2ML SOSY intra-articular injection    hydrochlorothiazide  (HYDRODIURIL ) 25 mg, Oral, Every morning   methylPREDNISolone  (MEDROL  DOSEPAK) 4 MG TBPK tablet Take package as directed   metolazone (ZAROXOLYN) 5 mg, Oral, Daily PRN, For swelling only   metoprolol  succinate (TOPROL -XL) 25 mg, Oral, Daily   nicotine  (NICODERM CQ  - DOSED IN MG/24 HOURS) 21 mg, Transdermal, Daily   nicotine  (NICODERM CQ  - DOSED IN MG/24 HOURS) 14 mg, Transdermal, Daily   nicotine  (NICODERM CQ  - DOSED IN MG/24 HR) 7 mg, Transdermal, Daily   NIFEdipine  (PROCARDIA  XL/NIFEDICAL XL) 120 mg, Oral, Daily at bedtime   olmesartan  (BENICAR ) 40 mg, Oral, Daily at bedtime   Oxycodone  HCl 10 mg, 4 times daily   pantoprazole  (PROTONIX ) 40 MG tablet Take 1 tablet (40 mg total) by mouth every morning   spironolactone  (ALDACTONE ) 12.5 mg, Oral, Every morning   Xarelto  20 mg, Oral, Daily at bedtime    Patient Active Problem List   Diagnosis Date Noted   COPD with acute exacerbation (HCC) 12/15/2023   Tobacco abuse counseling 12/15/2023   Acute hypoxic respiratory failure (HCC) 12/11/2023   Pulmonary emphysema (HCC) 08/20/2023   Vitamin D  deficiency 08/20/2023   Iron  deficiency anemia secondary to inadequate dietary iron  intake 08/20/2023   Right hip pain 05/15/2023   Tobacco dependence 05/15/2023  Lumbar radiculopathy 03/11/2023   Spinal stenosis of lumbar region 02/28/2023   HTN (hypertension), benign 02/28/2023   History of DVT (deep vein thrombosis) 02/28/2023   Hyperlipidemia 02/28/2023   Depression with anxiety 02/28/2023   Microcytic anemia 02/28/2023   Renal mass; right 01/08/2023   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   BPH with obstruction/lower urinary tract symptoms 01/08/2023   Chronic prostatitis 01/08/2023   Lesion of right native kidney 01/03/2023   Tick bite of right thigh 01/03/2023   Moderate episode of recurrent major  depressive disorder (HCC) 01/03/2023   Right flank pain 05/01/2021   Chronic right-sided back pain 05/01/2021   Chronic diarrhea 05/01/2021   Primary osteoarthritis of left knee 03/29/2020   Primary osteoarthritis of right knee 07/08/2017   Complete rotator cuff tear of left shoulder 10/21/2016   Gout attack 04/02/2016   Angina pectoris (HCC) 08/01/2014   Hemorrhage of rectum and anus 03/28/2014   Neuropathy, peripheral 05/15/2012   Pulmonary embolism (HCC) 07/11/2011   DVT (deep venous thrombosis) (HCC) 07/11/2011   Abdominal pain, left lower quadrant 01/29/2011   Long term current use of anticoagulant therapy 01/29/2011   Diarrhea 01/29/2011      Review of Systems  All other systems reviewed and are negative.     Objective:     BP (!) 160/80   Pulse 85   Temp 97.9 F (36.6 C) (Oral)   Ht 6\' 4"  (1.93 m)   Wt 228 lb 4.8 oz (103.6 kg)   SpO2 96%   BMI 27.79 kg/m  {Vitals History (Optional):23777}  Physical Exam Vitals reviewed.  Constitutional:      Appearance: Normal appearance. He is normal weight.  Cardiovascular:     Rate and Rhythm: Normal rate and regular rhythm.     Heart sounds: No murmur heard. Pulmonary:     Effort: Pulmonary effort is normal.     Breath sounds: No wheezing.  Neurological:     Mental Status: He is alert and oriented to person, place, and time. Mental status is at baseline.  Psychiatric:        Mood and Affect: Mood normal.        Behavior: Behavior normal.      No results found for any visits on 01/06/24.  {Labs (Optional):23779}  The ASCVD Risk score (Arnett DK, et al., 2019) failed to calculate for the following reasons:   Cannot find a previous HDL lab   Cannot find a previous total cholesterol lab    Assessment & Plan:  HTN (hypertension), benign -     Metoprolol  Succinate ER; Take 1 tablet (25 mg total) by mouth daily.  Dispense: 90 tablet; Refill: 0  Localized swelling of right lower leg -     metOLazone; Take 1  tablet (5 mg total) by mouth daily as needed. For swelling only  Dispense: 30 tablet; Refill: 2  Lumbar radiculopathy -     methylPREDNISolone ; Take package as directed  Dispense: 21 each; Refill: 0  Screening-pulmonary TB -     TB Skin Test     Return in about 2 months (around 03/07/2024) for HTN.    Aida House, MD

## 2024-01-07 ENCOUNTER — Other Ambulatory Visit (HOSPITAL_COMMUNITY): Payer: Self-pay

## 2024-01-07 ENCOUNTER — Other Ambulatory Visit: Payer: Self-pay

## 2024-01-07 NOTE — Assessment & Plan Note (Signed)
 Pt reports continued severe pain in his back, has been seeing pain management and getting medication and injections. Will treat with a medrol  dose pak to see if this iwll give him some relief. I advised he go back to his specialist to discuss his care.

## 2024-01-07 NOTE — Assessment & Plan Note (Signed)
 BP remains uncontrolled, will add back the metoprolol  starting at 25 mg daily-- I sent the rx to be included with his pill packs and spoke with the pharmacy about this. RTC in 2 months for BP repeat.

## 2024-01-08 ENCOUNTER — Other Ambulatory Visit: Payer: Self-pay

## 2024-01-08 ENCOUNTER — Other Ambulatory Visit (HOSPITAL_COMMUNITY): Payer: Self-pay

## 2024-01-08 ENCOUNTER — Ambulatory Visit

## 2024-01-08 LAB — TB SKIN TEST
Induration: 0 mm
TB Skin Test: NEGATIVE

## 2024-01-08 NOTE — Progress Notes (Signed)
 PPD Reading Note  PPD read and results entered in [employer].  Result: 0 mm induration.  Interpretation: Negative  If test not read within 48-72 hours of initial placement, patient advised to repeat in other arm 1-3 weeks after this test.  Allergic reaction: no

## 2024-01-09 ENCOUNTER — Other Ambulatory Visit: Payer: Self-pay

## 2024-01-10 ENCOUNTER — Other Ambulatory Visit: Payer: Self-pay | Admitting: Family Medicine

## 2024-01-10 DIAGNOSIS — I10 Essential (primary) hypertension: Secondary | ICD-10-CM

## 2024-01-12 ENCOUNTER — Other Ambulatory Visit: Payer: Self-pay

## 2024-01-12 ENCOUNTER — Other Ambulatory Visit (HOSPITAL_COMMUNITY): Payer: Self-pay

## 2024-01-13 ENCOUNTER — Other Ambulatory Visit: Payer: Self-pay

## 2024-01-14 ENCOUNTER — Other Ambulatory Visit: Payer: Self-pay

## 2024-01-19 ENCOUNTER — Other Ambulatory Visit: Payer: Self-pay

## 2024-01-23 ENCOUNTER — Ambulatory Visit: Admitting: Gastroenterology

## 2024-01-26 ENCOUNTER — Ambulatory Visit

## 2024-01-27 ENCOUNTER — Other Ambulatory Visit: Payer: Self-pay

## 2024-01-28 ENCOUNTER — Other Ambulatory Visit (HOSPITAL_COMMUNITY): Payer: Self-pay

## 2024-01-29 ENCOUNTER — Encounter: Payer: Self-pay | Admitting: Family Medicine

## 2024-01-30 ENCOUNTER — Ambulatory Visit: Admitting: Adult Health

## 2024-02-03 ENCOUNTER — Other Ambulatory Visit: Payer: Self-pay | Admitting: Family Medicine

## 2024-02-03 ENCOUNTER — Other Ambulatory Visit: Payer: Self-pay

## 2024-02-03 DIAGNOSIS — F331 Major depressive disorder, recurrent, moderate: Secondary | ICD-10-CM

## 2024-02-05 DIAGNOSIS — M48061 Spinal stenosis, lumbar region without neurogenic claudication: Secondary | ICD-10-CM | POA: Diagnosis not present

## 2024-02-05 DIAGNOSIS — M47896 Other spondylosis, lumbar region: Secondary | ICD-10-CM | POA: Diagnosis not present

## 2024-02-05 DIAGNOSIS — M7061 Trochanteric bursitis, right hip: Secondary | ICD-10-CM | POA: Diagnosis not present

## 2024-02-05 DIAGNOSIS — Z981 Arthrodesis status: Secondary | ICD-10-CM | POA: Diagnosis not present

## 2024-02-10 ENCOUNTER — Ambulatory Visit: Admitting: Family Medicine

## 2024-02-10 ENCOUNTER — Ambulatory Visit (INDEPENDENT_AMBULATORY_CARE_PROVIDER_SITE_OTHER): Admitting: Family Medicine

## 2024-02-10 ENCOUNTER — Encounter: Payer: Self-pay | Admitting: Family Medicine

## 2024-02-10 VITALS — BP 150/90 | HR 94 | Temp 98.5°F | Ht 76.0 in | Wt 224.4 lb

## 2024-02-10 DIAGNOSIS — E538 Deficiency of other specified B group vitamins: Secondary | ICD-10-CM

## 2024-02-10 DIAGNOSIS — D509 Iron deficiency anemia, unspecified: Secondary | ICD-10-CM | POA: Diagnosis not present

## 2024-02-10 DIAGNOSIS — R252 Cramp and spasm: Secondary | ICD-10-CM

## 2024-02-10 DIAGNOSIS — Z1211 Encounter for screening for malignant neoplasm of colon: Secondary | ICD-10-CM

## 2024-02-10 LAB — BASIC METABOLIC PANEL WITH GFR
BUN: 8 mg/dL (ref 6–23)
CO2: 31 meq/L (ref 19–32)
Calcium: 9.6 mg/dL (ref 8.4–10.5)
Chloride: 103 meq/L (ref 96–112)
Creatinine, Ser: 0.83 mg/dL (ref 0.40–1.50)
GFR: 86.65 mL/min (ref >60.00)
Glucose, Bld: 94 mg/dL (ref 70–99)
Potassium: 3.4 meq/L — ABNORMAL LOW (ref 3.5–5.1)
Sodium: 141 meq/L (ref 135–145)

## 2024-02-10 LAB — IRON,TIBC AND FERRITIN PANEL
%SAT: 22 % (ref 20–48)
Ferritin: 17 ng/mL — ABNORMAL LOW (ref 24–380)
Iron: 72 ug/dL (ref 50–180)
TIBC: 323 ug/dL (ref 250–425)

## 2024-02-10 LAB — MAGNESIUM: Magnesium: 1.7 mg/dL (ref 1.5–2.5)

## 2024-02-10 LAB — VITAMIN B12: Vitamin B-12: 372 pg/mL (ref 211–911)

## 2024-02-10 LAB — CK: Total CK: 173 U/L (ref 7–232)

## 2024-02-10 NOTE — Progress Notes (Signed)
 Established Patient Office Visit  Subjective   Patient ID: [name] [name], male    DOB: Sep 12, 1950  Age: 73 y.o. MRN: [id number]  Chief Complaint  Patient presents with   Leg Pain    Patient complains of right calf pain and cramps since last night after doing exercises   Dizziness    X1 month, worse when standing after being seated    Pt has 2 new complaints today.   Muscle cramps-- pt states that he was exercising last night and he started having severe muscle cramps in the right leg. States that he is getting them more often. States it is worse when he is using the muscles. States his hands will also cramp up after he makes himself a meal.  Dizziness- pt reports for the last month he has been having dizziness. He reports that when he gets up or bends over then stands up his head is very swimmy. States that he is unsteady on his feet. States it happened to him about 10 years ago as well and this feels similar. States that there is room spinning.    Current Outpatient Medications  Medication Instructions   albuterol  (PROVENTIL  HFA;VENTOLIN  HFA) 108 (90 Base) MCG/ACT inhaler 2 puffs, Inhalation, Every 6 hours PRN   albuterol  (PROVENTIL ) 2.5 mg, Every 6 hours PRN   alfuzosin  (UROXATRAL ) 10 MG 24 hr tablet Take 1 tablet (10 mg total) by mouth every morning   ARIPiprazole  (ABILIFY ) 10 mg, Oral, Daily at bedtime   atorvastatin  (LIPITOR) 20 MG tablet Take 1 tablet (20 mg total) by mouth in the morning   BREO ELLIPTA  100-25 MCG/ACT AEPB 1 puff, Daily   Budeson-Glycopyrrol-Formoterol (BREZTRI  AEROSPHERE) 160-9-4.8 MCG/ACT AERO 2 puffs, Inhalation, 2 times daily   cyclobenzaprine  (FLEXERIL ) 10 mg, Oral, 3 times daily PRN   DULoxetine  (CYMBALTA ) 60 MG capsule TAKE 1 CAPSULE(60 MG) BY MOUTH DAILY   DULoxetine  (CYMBALTA ) 20 mg, Oral, Every morning   GELSYN-3 16.8 MG/2ML SOSY intra-articular injection    hydrochlorothiazide  (HYDRODIURIL ) 25 MG tablet TAKE 1 TABLET(25 MG) BY MOUTH DAILY    metolazone  (ZAROXOLYN ) 5 mg, Oral, Daily PRN, For swelling only   metoprolol  succinate (TOPROL -XL) 25 mg, Oral, Daily   nicotine  (NICODERM CQ  - DOSED IN MG/24 HOURS) 21 mg, Transdermal, Daily   nicotine  (NICODERM CQ  - DOSED IN MG/24 HOURS) 14 mg, Transdermal, Daily   nicotine  (NICODERM CQ  - DOSED IN MG/24 HR) 7 mg, Transdermal, Daily   NIFEdipine  (PROCARDIA  XL/NIFEDICAL XL) 120 mg, Oral, Daily at bedtime   olmesartan  (BENICAR ) 40 mg, Oral, Daily at bedtime   Oxycodone  HCl 10 mg, 4 times daily   pantoprazole  (PROTONIX ) 40 MG tablet Take 1 tablet (40 mg total) by mouth every morning   spironolactone  (ALDACTONE ) 12.5 mg, Oral, Every morning   Xarelto  20 mg, Oral, Daily at bedtime    Patient Active Problem List   Diagnosis Date Noted   COPD with acute exacerbation (HCC) 12/15/2023   Tobacco abuse counseling 12/15/2023   Acute hypoxic respiratory failure (HCC) 12/11/2023   Pulmonary emphysema (HCC) 08/20/2023   Vitamin D  deficiency 08/20/2023   Iron  deficiency anemia secondary to inadequate dietary iron  intake 08/20/2023   Right hip pain 05/15/2023   Tobacco dependence 05/15/2023   Lumbar radiculopathy 03/11/2023   Spinal stenosis of lumbar region 02/28/2023   HTN (hypertension), benign 02/28/2023   History of DVT (deep vein thrombosis) 02/28/2023   Hyperlipidemia 02/28/2023   Depression with anxiety 02/28/2023   Microcytic anemia 02/28/2023  Renal mass; right 01/08/2023   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   BPH with obstruction/lower urinary tract symptoms 01/08/2023   Chronic prostatitis 01/08/2023   Lesion of right native kidney 01/03/2023   Tick bite of right thigh 01/03/2023   Moderate episode of recurrent major depressive disorder (HCC) 01/03/2023   Right flank pain 05/01/2021   Chronic right-sided back pain 05/01/2021   Chronic diarrhea 05/01/2021   Primary osteoarthritis of left knee 03/29/2020   Primary osteoarthritis of right knee 07/08/2017   Complete rotator cuff tear of  left shoulder 10/21/2016   Gout attack 04/02/2016   Angina pectoris (HCC) 08/01/2014   Hemorrhage of rectum and anus 03/28/2014   Neuropathy, peripheral 05/15/2012   Pulmonary embolism (HCC) 07/11/2011   DVT (deep venous thrombosis) (HCC) 07/11/2011   Abdominal pain, left lower quadrant 01/29/2011   Long term current use of anticoagulant therapy 01/29/2011   Diarrhea 01/29/2011      Review of Systems  All other systems reviewed and are negative.     Objective:     BP (!) 150/90   Pulse 94   Temp 98.5 F (36.9 C) (Oral)   Ht 6\' 4"  (1.93 m)   Wt 224 lb 6.4 oz (101.8 kg)   SpO2 98%   BMI 27.31 kg/m    Physical Exam Vitals reviewed.  Constitutional:      Appearance: Normal appearance. He is well-groomed and normal weight.  Cardiovascular:     Rate and Rhythm: Normal rate and regular rhythm.     Heart sounds: S1 normal and S2 normal. No murmur heard. Pulmonary:     Effort: Pulmonary effort is normal.     Breath sounds: Normal breath sounds and air entry. No rales.  Musculoskeletal:     Right lower leg: No edema.     Left lower leg: No edema.  Neurological:     General: No focal deficit present.     Mental Status: He is alert and oriented to person, place, and time.     Gait: Gait is intact.  Psychiatric:        Mood and Affect: Mood and affect normal.      No results found for any visits on 02/10/24.    The ASCVD Risk score (Arnett DK, et al., 2019) failed to calculate for the following reasons:   Cannot find a previous HDL lab   Cannot find a previous total cholesterol lab    Assessment & Plan:  Muscle cramps -     CK; Future -     Magnesium ; Future -     Basic metabolic panel with GFR; Future  B12 deficiency -     Vitamin B12; Future  Colon cancer screening -     Ambulatory referral to Gastroenterology  Iron  deficiency anemia, unspecified iron  deficiency anemia type -     Iron , TIBC and Ferritin Panel; Future    Muscle cramps could be from  hypokalemia or possibly a reaction to the statin medication. I will order labs to look for abnormalities in the electrolytes. I recommended starting magnesium  400 mg tablet at bedtime-- this will help with cramping as well as the sleep issues he is reporting today. I also gave him handouts on the Epley maneuvers for his vertigo and I took him through the maneuvers verbally as well. Pt has a follow up scheduled with me July 1. No follow-ups on file.    Aida House, MD

## 2024-02-10 NOTE — Patient Instructions (Signed)
Magnesium oxide 400 mg at bedtime.

## 2024-02-11 ENCOUNTER — Ambulatory Visit: Payer: Self-pay | Admitting: Family Medicine

## 2024-02-11 DIAGNOSIS — E876 Hypokalemia: Secondary | ICD-10-CM

## 2024-02-11 MED ORDER — POTASSIUM CHLORIDE CRYS ER 20 MEQ PO TBCR: 20.0000 meq | EXTENDED_RELEASE_TABLET | Freq: Every day | ORAL | 0 refills | Status: AC

## 2024-02-16 ENCOUNTER — Other Ambulatory Visit: Payer: Self-pay

## 2024-02-16 DIAGNOSIS — M170 Bilateral primary osteoarthritis of knee: Secondary | ICD-10-CM | POA: Diagnosis not present

## 2024-02-17 ENCOUNTER — Other Ambulatory Visit: Payer: Self-pay

## 2024-02-26 ENCOUNTER — Other Ambulatory Visit: Payer: Self-pay | Admitting: Family Medicine

## 2024-02-26 DIAGNOSIS — M48061 Spinal stenosis, lumbar region without neurogenic claudication: Secondary | ICD-10-CM | POA: Diagnosis not present

## 2024-02-26 DIAGNOSIS — F331 Major depressive disorder, recurrent, moderate: Secondary | ICD-10-CM

## 2024-02-26 DIAGNOSIS — M47816 Spondylosis without myelopathy or radiculopathy, lumbar region: Secondary | ICD-10-CM | POA: Diagnosis not present

## 2024-02-26 DIAGNOSIS — Z981 Arthrodesis status: Secondary | ICD-10-CM | POA: Diagnosis not present

## 2024-03-08 ENCOUNTER — Other Ambulatory Visit: Payer: Self-pay | Admitting: Family Medicine

## 2024-03-08 ENCOUNTER — Other Ambulatory Visit: Payer: Self-pay

## 2024-03-08 DIAGNOSIS — F331 Major depressive disorder, recurrent, moderate: Secondary | ICD-10-CM

## 2024-03-09 ENCOUNTER — Encounter: Payer: Self-pay | Admitting: Family Medicine

## 2024-03-09 ENCOUNTER — Telehealth: Payer: Self-pay | Admitting: *Deleted

## 2024-03-09 ENCOUNTER — Ambulatory Visit (INDEPENDENT_AMBULATORY_CARE_PROVIDER_SITE_OTHER): Admitting: Family Medicine

## 2024-03-09 ENCOUNTER — Ambulatory Visit (INDEPENDENT_AMBULATORY_CARE_PROVIDER_SITE_OTHER)

## 2024-03-09 VITALS — BP 150/90 | HR 110 | Temp 98.7°F | Ht 76.0 in | Wt 228.0 lb

## 2024-03-09 DIAGNOSIS — R071 Chest pain on breathing: Secondary | ICD-10-CM | POA: Diagnosis not present

## 2024-03-09 DIAGNOSIS — J441 Chronic obstructive pulmonary disease with (acute) exacerbation: Secondary | ICD-10-CM | POA: Diagnosis not present

## 2024-03-09 DIAGNOSIS — R110 Nausea: Secondary | ICD-10-CM | POA: Diagnosis not present

## 2024-03-09 DIAGNOSIS — I10 Essential (primary) hypertension: Secondary | ICD-10-CM | POA: Diagnosis not present

## 2024-03-09 MED ORDER — PREDNISONE 20 MG PO TABS: ORAL_TABLET | ORAL | 0 refills | Status: AC

## 2024-03-09 MED ORDER — DOXYCYCLINE HYCLATE 100 MG PO TABS
100.0000 mg | ORAL_TABLET | Freq: Two times a day (BID) | ORAL | 0 refills | Status: AC
Start: 2024-03-09 — End: 2024-03-16

## 2024-03-09 MED ORDER — PREDNISONE 20 MG PO TABS: ORAL_TABLET | ORAL | 0 refills | Status: DC

## 2024-03-09 MED ORDER — METHYLPREDNISOLONE ACETATE 80 MG/ML IJ SUSP
80.0000 mg | Freq: Once | INTRAMUSCULAR | Status: AC
  Administered 2024-03-09: 80 mg via INTRAMUSCULAR

## 2024-03-09 MED ORDER — ONDANSETRON HCL 4 MG PO TABS: 4.0000 mg | ORAL_TABLET | Freq: Three times a day (TID) | ORAL | 2 refills | Status: AC | PRN

## 2024-03-09 MED ORDER — METHYLPREDNISOLONE SODIUM SUCC 125 MG IJ SOLR: 125.0000 mg | Freq: Once | INTRAMUSCULAR | Status: DC

## 2024-03-09 MED ORDER — METOPROLOL SUCCINATE ER 25 MG PO TB24
25.0000 mg | ORAL_TABLET | Freq: Every day | ORAL | 1 refills | Status: DC
Start: 2024-03-09 — End: 2024-07-28
  Filled 2024-03-11 – 2024-05-12 (×2): qty 30, 30d supply, fill #0
  Filled 2024-06-10: qty 30, 30d supply, fill #1
  Filled 2024-07-06: qty 30, 30d supply, fill #2

## 2024-03-09 NOTE — Telephone Encounter (Signed)
 I sent a new script to clarify quantity and directsion

## 2024-03-09 NOTE — Progress Notes (Signed)
 Established Patient Office Visit  Subjective   Patient ID: [name] [name], male    DOB: October 08, 1950  Age: 73 y.o. MRN: [id number]  Chief Complaint  Patient presents with   Medical Management of Chronic Issues   Shortness of Breath    Worse since last night, used nebulizer treatment with no relief    States that last night he started feeling very short of breath, states that he went to the bathroom and he couldn't breathe, has been up all night, had to take more breathing treatments through the night, but didn't really seem to help that much, some fever/ subjective fever. No coughing but there is some mucus coming up. States that he is not gaining weight, we reviewed his medications and he reports compliance with his diuretics. No swelling.     Current Outpatient Medications  Medication Instructions   albuterol  (PROVENTIL  HFA;VENTOLIN  HFA) 108 (90 Base) MCG/ACT inhaler 2 puffs, Inhalation, Every 6 hours PRN   albuterol  (PROVENTIL ) 2.5 mg, Every 6 hours PRN   alfuzosin  (UROXATRAL ) 10 MG 24 hr tablet Take 1 tablet (10 mg total) by mouth every morning   ARIPiprazole  (ABILIFY ) 10 MG tablet TAKE 1 TABLET(10 MG) BY MOUTH DAILY   atorvastatin  (LIPITOR) 20 MG tablet Take 1 tablet (20 mg total) by mouth in the morning   BREO ELLIPTA  100-25 MCG/ACT AEPB 1 puff, Daily   Budeson-Glycopyrrol-Formoterol (BREZTRI  AEROSPHERE) 160-9-4.8 MCG/ACT AERO 2 puffs, Inhalation, 2 times daily   cyclobenzaprine  (FLEXERIL ) 10 mg, Oral, 3 times daily PRN   doxycycline  (VIBRA -TABS) 100 mg, Oral, 2 times daily   DULoxetine  (CYMBALTA ) 60 MG capsule TAKE 1 CAPSULE(60 MG) BY MOUTH DAILY   DULoxetine  (CYMBALTA ) 20 mg, Oral, Every morning   GELSYN-3 16.8 MG/2ML SOSY intra-articular injection    hydrochlorothiazide  (HYDRODIURIL ) 25 MG tablet TAKE 1 TABLET(25 MG) BY MOUTH DAILY   metolazone  (ZAROXOLYN ) 5 mg, Oral, Daily PRN, For swelling only   metoprolol  succinate (TOPROL -XL) 25 mg, Oral, Daily   nicotine  (NICODERM CQ   - DOSED IN MG/24 HOURS) 21 mg, Transdermal, Daily   nicotine  (NICODERM CQ  - DOSED IN MG/24 HOURS) 14 mg, Transdermal, Daily   nicotine  (NICODERM CQ  - DOSED IN MG/24 HR) 7 mg, Transdermal, Daily   NIFEdipine  (PROCARDIA  XL/NIFEDICAL XL) 120 mg, Oral, Daily at bedtime   olmesartan  (BENICAR ) 40 mg, Oral, Daily at bedtime   ondansetron  (ZOFRAN ) 4 mg, Oral, Every 8 hours PRN   Oxycodone  HCl 10 mg, 4 times daily   pantoprazole  (PROTONIX ) 40 MG tablet Take 1 tablet (40 mg total) by mouth every morning   potassium chloride  SA (KLOR-CON  M) 20 MEQ tablet 20 mEq, Oral, Daily   predniSONE  (DELTASONE ) 20 MG tablet Take 3 tablets (60 mg total) by mouth daily with breakfast for 3 days, THEN 2 tablets (40 mg total) daily with breakfast for 3 days, THEN 1 tablet (20 mg total) daily with breakfast for 3 days, THEN 0.5 tablets (10 mg total) daily with breakfast for 3 days. Take 2 pills for 3 days, 1 pill for 4 days.   spironolactone  (ALDACTONE ) 12.5 mg, Oral, Every morning   Xarelto  20 mg, Oral, Daily at bedtime    Patient Active Problem List   Diagnosis Date Noted   COPD with acute exacerbation (HCC) 12/15/2023   Tobacco abuse counseling 12/15/2023   Acute hypoxic respiratory failure (HCC) 12/11/2023   Pulmonary emphysema (HCC) 08/20/2023   Vitamin D  deficiency 08/20/2023   Iron  deficiency anemia secondary to inadequate dietary iron  intake 08/20/2023  Right hip pain 05/15/2023   Tobacco dependence 05/15/2023   Lumbar radiculopathy 03/11/2023   Spinal stenosis of lumbar region 02/28/2023   HTN (hypertension), benign 02/28/2023   History of DVT (deep vein thrombosis) 02/28/2023   Hyperlipidemia 02/28/2023   Depression with anxiety 02/28/2023   Microcytic anemia 02/28/2023   Renal mass; right 01/08/2023   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   BPH with obstruction/lower urinary tract symptoms 01/08/2023   Chronic prostatitis 01/08/2023   Lesion of right native kidney 01/03/2023   Tick bite of right thigh  01/03/2023   Moderate episode of recurrent major depressive disorder (HCC) 01/03/2023   Right flank pain 05/01/2021   Chronic right-sided back pain 05/01/2021   Chronic diarrhea 05/01/2021   Primary osteoarthritis of left knee 03/29/2020   Primary osteoarthritis of right knee 07/08/2017   Complete rotator cuff tear of left shoulder 10/21/2016   Gout attack 04/02/2016   Angina pectoris (HCC) 08/01/2014   Hemorrhage of rectum and anus 03/28/2014   Neuropathy, peripheral 05/15/2012   Pulmonary embolism (HCC) 07/11/2011   DVT (deep venous thrombosis) (HCC) 07/11/2011   Abdominal pain, left lower quadrant 01/29/2011   Long term current use of anticoagulant therapy 01/29/2011   Diarrhea 01/29/2011      Review of Systems  All other systems reviewed and are negative.     Objective:     BP (!) 150/90   Pulse (!) 110   Temp 98.7 F (37.1 C) (Oral)   Ht 6' 4 (1.93 m)   Wt 228 lb (103.4 kg)   SpO2 99%   BMI 27.75 kg/m    Physical Exam Vitals reviewed.  Constitutional:      Appearance: Normal appearance. He is well-groomed and normal weight.  Eyes:     Extraocular Movements: Extraocular movements intact.     Conjunctiva/sclera: Conjunctivae normal.  Neck:     Thyroid : No thyromegaly.  Cardiovascular:     Rate and Rhythm: Normal rate and regular rhythm.     Heart sounds: S1 normal and S2 normal. No murmur heard. Pulmonary:     Effort: Pulmonary effort is normal.     Breath sounds: Normal air entry. Decreased breath sounds and wheezing present. No rales.  Abdominal:     General: Abdomen is flat. Bowel sounds are normal.  Musculoskeletal:     Right lower leg: No edema.     Left lower leg: No edema.  Neurological:     General: No focal deficit present.     Mental Status: He is alert and oriented to person, place, and time.     Gait: Gait is intact.  Psychiatric:        Mood and Affect: Mood and affect normal.      No results found for any visits on  03/09/24.    The ASCVD Risk score (Arnett DK, et al., 2019) failed to calculate for the following reasons:   Cannot find a previous HDL lab   Cannot find a previous total cholesterol lab    Assessment & Plan:  HTN (hypertension), benign -     Metoprolol  Succinate ER; Take 1 tablet (25 mg total) by mouth daily.  Dispense: 90 tablet; Refill: 1  Acute exacerbation of chronic obstructive pulmonary disease (COPD) (HCC) -     DG Chest 2 View; Future -     methylPREDNISolone  Sodium Succ -     predniSONE ; Take 3 tablets (60 mg total) by mouth daily with breakfast for 3 days, THEN 2 tablets (40 mg total)  daily with breakfast for 3 days, THEN 1 tablet (20 mg total) daily with breakfast for 3 days, THEN 0.5 tablets (10 mg total) daily with breakfast for 3 days. Take 2 pills for 3 days, 1 pill for 4 days.  Dispense: 19 tablet; Refill: 0 -     Doxycycline  Hyclate; Take 1 tablet (100 mg total) by mouth 2 (two) times daily for 7 days.  Dispense: 14 tablet; Refill: 0  Nausea -     Ondansetron  HCl; Take 1 tablet (4 mg total) by mouth every 8 (eight) hours as needed for nausea or vomiting.  Dispense: 30 tablet; Refill: 2   Will treat acute COPD exacerbation with steroids and abx. Will check CXR to rule out pulmonary edema and pneumonia.   Return in about 3 months (around 06/09/2024) for HTN.    Heron CHRISTELLA Sharper, MD

## 2024-03-09 NOTE — Telephone Encounter (Unsigned)
 Copied from CRM [id number]. Topic: Clinical - Medication Question >> Mar 09, 2024  3:34 PM Robinson H wrote: Reason for CRM: [facility] Pharmacist calling for directions and quantity clarification, states the 19 pills wont be enough maybe 20, and last line on prescription they need to clarify. Take 2 pills for 3 days, 1 pill for 4 days, can fax or leave message  [facility] Pharmacist [phone]

## 2024-03-10 ENCOUNTER — Other Ambulatory Visit (HOSPITAL_COMMUNITY): Payer: Self-pay

## 2024-03-10 NOTE — Telephone Encounter (Signed)
 Left a detailed message with the information below at the pharmacy voicemail at [facility].

## 2024-03-11 ENCOUNTER — Other Ambulatory Visit (HOSPITAL_COMMUNITY): Payer: Self-pay

## 2024-03-11 ENCOUNTER — Other Ambulatory Visit: Payer: Self-pay

## 2024-03-11 MED ORDER — ARIPIPRAZOLE 10 MG PO TABS
10.0000 mg | ORAL_TABLET | Freq: Every day | ORAL | 4 refills | Status: AC
  Filled 2024-03-11: qty 30, 30d supply, fill #0
  Filled 2024-03-11: qty 90, 90d supply, fill #0
  Filled 2024-04-15: qty 30, 30d supply, fill #1
  Filled 2024-05-12: qty 30, 30d supply, fill #2
  Filled 2024-06-10: qty 30, 30d supply, fill #3
  Filled 2024-07-06: qty 30, 30d supply, fill #4
  Filled 2024-07-28 – 2024-07-29 (×2): qty 30, 30d supply, fill #5
  Filled 2024-09-13: qty 30, 30d supply, fill #6
  Filled 2024-10-12: qty 30, 30d supply, fill #7

## 2024-03-15 ENCOUNTER — Ambulatory Visit: Payer: Self-pay | Admitting: Family Medicine

## 2024-03-17 DIAGNOSIS — R52 Pain, unspecified: Secondary | ICD-10-CM | POA: Diagnosis not present

## 2024-03-17 DIAGNOSIS — M170 Bilateral primary osteoarthritis of knee: Secondary | ICD-10-CM | POA: Diagnosis not present

## 2024-03-18 ENCOUNTER — Other Ambulatory Visit: Payer: Self-pay

## 2024-03-25 ENCOUNTER — Other Ambulatory Visit (HOSPITAL_COMMUNITY): Payer: Self-pay

## 2024-03-25 ENCOUNTER — Other Ambulatory Visit: Payer: Self-pay

## 2024-03-26 ENCOUNTER — Other Ambulatory Visit: Payer: Self-pay

## 2024-03-26 ENCOUNTER — Other Ambulatory Visit (HOSPITAL_COMMUNITY): Payer: Self-pay

## 2024-03-29 ENCOUNTER — Other Ambulatory Visit: Payer: Self-pay

## 2024-04-01 ENCOUNTER — Other Ambulatory Visit: Payer: Self-pay

## 2024-04-11 ENCOUNTER — Emergency Department (HOSPITAL_COMMUNITY)
Admission: EM | Admit: 2024-04-11 | Discharge: 2024-04-11 | Discharge status: Against Medical Advice | Attending: Emergency Medicine | Admitting: Emergency Medicine

## 2024-04-11 DIAGNOSIS — M549 Dorsalgia, unspecified: Secondary | ICD-10-CM | POA: Insufficient documentation

## 2024-04-11 DIAGNOSIS — Z5321 Procedure and treatment not carried out due to patient leaving prior to being seen by health care provider: Secondary | ICD-10-CM | POA: Diagnosis not present

## 2024-04-11 NOTE — ED Triage Notes (Addendum)
 Pt states while working on car today felt his back tighten up and pain is shooting down left leg. Hx of back surgery 2023

## 2024-04-11 NOTE — ED Notes (Signed)
 Pt walked out stating he couldn't wait any longer to get a room.

## 2024-04-12 ENCOUNTER — Ambulatory Visit (HOSPITAL_BASED_OUTPATIENT_CLINIC_OR_DEPARTMENT_OTHER): Admitting: Physician Assistant

## 2024-04-15 ENCOUNTER — Other Ambulatory Visit: Payer: Self-pay

## 2024-04-16 ENCOUNTER — Telehealth: Payer: Self-pay

## 2024-04-16 NOTE — Transitions of Care (Post Inpatient/ED Visit) (Signed)
   04/16/2024  Name: [name] [name] MRN: [id number] DOB: 02-15-1951  Today's TOC FU Call Status:    Attempted to reach the patient regarding the most recent Inpatient/ED visit.  Follow Up Plan: Additional outreach attempts will be made to reach the patient to complete the Transitions of Care (Post Inpatient/ED visit) call.   Signature: Child Campoy, CMA

## 2024-04-23 ENCOUNTER — Ambulatory Visit: Admitting: Family Medicine

## 2024-04-26 ENCOUNTER — Telehealth: Payer: Self-pay | Admitting: Family Medicine

## 2024-04-26 NOTE — Telephone Encounter (Signed)
 Patient has missed 3 appointments since 09/29/2023 which qualifies for dismissal. Please advise. Thanks!

## 2024-05-05 ENCOUNTER — Telehealth: Payer: Self-pay

## 2024-05-05 NOTE — Telephone Encounter (Signed)
 Left a message for the patient to return my call.

## 2024-05-05 NOTE — Telephone Encounter (Signed)
 I think it is a transportation issue, I would wait to see if he shows up to the next appt, if he misses that then we can dismiss

## 2024-05-05 NOTE — Progress Notes (Signed)
   05/05/2024  Patient ID: Dallas LITTIE Agent, male   DOB: April 09, 1951, 73 y.o.   MRN: [id number]  Attempted to contact patient to reschedule missed appointment for medication management. Left HIPAA compliant message for patient to return my call at their convenience.   Jon VEAR Lindau, PharmD Clinical Pharmacist [phone]

## 2024-05-12 ENCOUNTER — Other Ambulatory Visit: Payer: Self-pay

## 2024-05-12 NOTE — Telephone Encounter (Signed)
 Patient informed of the office policy below and was also advised he could change an in-office visit to a phone visit if needed.

## 2024-05-13 ENCOUNTER — Ambulatory Visit: Admitting: Family Medicine

## 2024-05-13 ENCOUNTER — Encounter: Payer: Self-pay | Admitting: Family Medicine

## 2024-05-13 ENCOUNTER — Other Ambulatory Visit: Payer: Self-pay

## 2024-05-13 VITALS — BP 160/82 | HR 111 | Temp 99.3°F | Ht 76.0 in | Wt 224.3 lb

## 2024-05-13 DIAGNOSIS — H65191 Other acute nonsuppurative otitis media, right ear: Secondary | ICD-10-CM

## 2024-05-13 DIAGNOSIS — R059 Cough, unspecified: Secondary | ICD-10-CM | POA: Diagnosis not present

## 2024-05-13 DIAGNOSIS — I10 Essential (primary) hypertension: Secondary | ICD-10-CM | POA: Diagnosis not present

## 2024-05-13 DIAGNOSIS — J441 Chronic obstructive pulmonary disease with (acute) exacerbation: Secondary | ICD-10-CM | POA: Diagnosis not present

## 2024-05-13 DIAGNOSIS — G4733 Obstructive sleep apnea (adult) (pediatric): Secondary | ICD-10-CM | POA: Diagnosis not present

## 2024-05-13 DIAGNOSIS — R519 Headache, unspecified: Secondary | ICD-10-CM | POA: Diagnosis not present

## 2024-05-13 LAB — POC COVID19 BINAXNOW: SARS Coronavirus 2 Ag: NEGATIVE

## 2024-05-13 LAB — POCT INFLUENZA A/B
Influenza A, POC: NEGATIVE
Influenza B, POC: NEGATIVE

## 2024-05-13 MED ORDER — CEFDINIR 300 MG PO CAPS: 300.0000 mg | ORAL_CAPSULE | Freq: Two times a day (BID) | ORAL | 0 refills | Status: AC

## 2024-05-13 MED ORDER — PREDNISONE 20 MG PO TABS: ORAL_TABLET | ORAL | 0 refills | Status: AC

## 2024-05-13 NOTE — Progress Notes (Signed)
 Established Patient Office Visit  Subjective   Patient ID: [name] [name], male    DOB: Mar 17, 1951  Age: 73 y.o. MRN: [id number]  Chief Complaint  Patient presents with   Cough    Productive with thick and thin, yellow-white sputum x3 days, tried OTC chest decongestant and Tylenol  Sinus with no relief   Headache    Severe x3 days   Shortness of Breath    Severe since last night, used nebulizer treatment with no relief    Mucus and coughing for 3 days, headache with the cough, some SOB, no chest pain, no known sick contacts, covid and flu are both negative today. No fever, maybe some chills, has been using nebulizer treatments without any improvement.   HTN -- pt continues to have elevated blood pressure despite multiple medications to control it and having a pill pack. He does continue to smoke heavily. He reports he is taking his pill packs as prescribed. States that he doesn't like them because he doesn't know what he is taking. I explained that he should refer to his medication list, not try to identify the pills by sight as they may change depending on the manufacturer. We discussed getting additional work up including a renal artery duplex to rule out RAS.   Shortness of Breath    Current Outpatient Medications  Medication Instructions   albuterol  (PROVENTIL  HFA;VENTOLIN  HFA) 108 (90 Base) MCG/ACT inhaler 2 puffs, Inhalation, Every 6 hours PRN   albuterol  (PROVENTIL ) 2.5 mg, Every 6 hours PRN   alfuzosin  (UROXATRAL ) 10 MG 24 hr tablet Take 1 tablet (10 mg total) by mouth every morning   ARIPiprazole  (ABILIFY ) 10 MG tablet TAKE 1 TABLET(10 MG) BY MOUTH DAILY   ARIPiprazole  (ABILIFY ) 10 mg, Oral, Daily   atorvastatin  (LIPITOR) 20 MG tablet Take 1 tablet (20 mg total) by mouth in the morning   BREO ELLIPTA  100-25 MCG/ACT AEPB 1 puff, Daily   Budeson-Glycopyrrol-Formoterol (BREZTRI  AEROSPHERE) 160-9-4.8 MCG/ACT AERO 2 puffs, Inhalation, 2 times daily   cefdinir  (OMNICEF ) 300  mg, Oral, 2 times daily   cyclobenzaprine  (FLEXERIL ) 10 mg, Oral, 3 times daily PRN   DULoxetine  (CYMBALTA ) 60 MG capsule TAKE 1 CAPSULE(60 MG) BY MOUTH DAILY   DULoxetine  (CYMBALTA ) 20 mg, Oral, Every morning   GELSYN-3 16.8 MG/2ML SOSY intra-articular injection    hydrochlorothiazide  (HYDRODIURIL ) 25 MG tablet TAKE 1 TABLET(25 MG) BY MOUTH DAILY   metolazone  (ZAROXOLYN ) 5 mg, Oral, Daily PRN, For swelling only   metoprolol  succinate (TOPROL -XL) 25 mg, Oral, Daily   nicotine  (NICODERM CQ  - DOSED IN MG/24 HOURS) 21 mg, Transdermal, Daily   nicotine  (NICODERM CQ  - DOSED IN MG/24 HOURS) 14 mg, Transdermal, Daily   nicotine  (NICODERM CQ  - DOSED IN MG/24 HR) 7 mg, Transdermal, Daily   NIFEdipine  (PROCARDIA  XL/NIFEDICAL XL) 120 mg, Oral, Daily at bedtime   olmesartan  (BENICAR ) 40 mg, Oral, Daily at bedtime   ondansetron  (ZOFRAN ) 4 mg, Oral, Every 8 hours PRN   Oxycodone  HCl 10 mg, 4 times daily   pantoprazole  (PROTONIX ) 40 MG tablet Take 1 tablet (40 mg total) by mouth every morning   potassium chloride  SA (KLOR-CON  M) 20 MEQ tablet 20 mEq, Oral, Daily   predniSONE  (DELTASONE ) 20 MG tablet Take 3 tablets (60 mg total) by mouth daily with breakfast for 3 days, THEN 2 tablets (40 mg total) daily with breakfast for 3 days, THEN 1 tablet (20 mg total) daily with breakfast for 3 days, THEN 0.5 tablets (10 mg total)  daily with breakfast for 3 days.   spironolactone  (ALDACTONE ) 12.5 mg, Oral, Every morning   Xarelto  20 mg, Oral, Daily at bedtime    Patient Active Problem List   Diagnosis Date Noted   COPD with acute exacerbation (HCC) 12/15/2023   Tobacco abuse counseling 12/15/2023   Acute hypoxic respiratory failure (HCC) 12/11/2023   Pulmonary emphysema (HCC) 08/20/2023   Vitamin D  deficiency 08/20/2023   Iron  deficiency anemia secondary to inadequate dietary iron  intake 08/20/2023   Right hip pain 05/15/2023   Tobacco dependence 05/15/2023   Lumbar radiculopathy 03/11/2023   Spinal stenosis  of lumbar region 02/28/2023   HTN (hypertension), benign 02/28/2023   History of DVT (deep vein thrombosis) 02/28/2023   Hyperlipidemia 02/28/2023   Depression with anxiety 02/28/2023   Microcytic anemia 02/28/2023   Renal mass; right 01/08/2023   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   BPH with obstruction/lower urinary tract symptoms 01/08/2023   Chronic prostatitis 01/08/2023   Lesion of right native kidney 01/03/2023   Tick bite of right thigh 01/03/2023   Moderate episode of recurrent major depressive disorder (HCC) 01/03/2023   Right flank pain 05/01/2021   Chronic right-sided back pain 05/01/2021   Chronic diarrhea 05/01/2021   Primary osteoarthritis of left knee 03/29/2020   Primary osteoarthritis of right knee 07/08/2017   Complete rotator cuff tear of left shoulder 10/21/2016   Gout attack 04/02/2016   Angina pectoris (HCC) 08/01/2014   Hemorrhage of rectum and anus 03/28/2014   Neuropathy, peripheral 05/15/2012   Pulmonary embolism (HCC) 07/11/2011   DVT (deep venous thrombosis) (HCC) 07/11/2011   Abdominal pain, left lower quadrant 01/29/2011   Long term current use of anticoagulant therapy 01/29/2011   Diarrhea 01/29/2011      Review of Systems  All other systems reviewed and are negative.     Objective:     BP (!) 160/82   Pulse (!) 111   Temp 99.3 F (37.4 C) (Oral)   Ht 6' 4 (1.93 m)   Wt 224 lb 4.8 oz (101.7 kg)   SpO2 92%   BMI 27.30 kg/m    Physical Exam Vitals reviewed.  Constitutional:      Appearance: He is well-developed.  HENT:     Right Ear: Tympanic membrane is erythematous.     Left Ear: Tympanic membrane normal.  Eyes:     Extraocular Movements: Extraocular movements intact.  Cardiovascular:     Rate and Rhythm: Normal rate and regular rhythm.     Heart sounds: Normal heart sounds.  Pulmonary:     Effort: Pulmonary effort is normal.     Breath sounds: Wheezing (expiratory wheezing in the lower lobes BL) present.  Neurological:      Mental Status: He is alert and oriented to person, place, and time.      Results for orders placed or performed in visit on 05/13/24  POC COVID-19  Result Value Ref Range   SARS Coronavirus 2 Ag Negative Negative  POC Influenza A/B  Result Value Ref Range   Influenza A, POC Negative Negative   Influenza B, POC Negative Negative      The ASCVD Risk score (Arnett DK, et al., 2019) failed to calculate for the following reasons:   Cannot find a previous HDL lab   Cannot find a previous total cholesterol lab    Assessment & Plan:  Cough, unspecified type -     POC COVID-19 BinaxNow -     POCT Influenza A/B  Nonintractable headache, unspecified chronicity  pattern, unspecified headache type -     POC COVID-19 BinaxNow -     POCT Influenza A/B  COPD with acute exacerbation (HCC) Cough with worsening SOB, will treat with prednisone  and abx for the ear infection, pt has inhalers at home and he is encouraged to continue using them.  -     predniSONE ; Take 3 tablets (60 mg total) by mouth daily with breakfast for 3 days, THEN 2 tablets (40 mg total) daily with breakfast for 3 days, THEN 1 tablet (20 mg total) daily with breakfast for 3 days, THEN 0.5 tablets (10 mg total) daily with breakfast for 3 days.  Dispense: 20 tablet; Refill: 0  Other non-recurrent acute nonsuppurative otitis media of right ear -     Cefdinir ; Take 1 capsule (300 mg total) by mouth 2 (two) times daily for 7 days.  Dispense: 14 capsule; Refill: 0  OSA (obstructive sleep apnea) -     Ambulatory referral to Sleep Studies  HTN (hypertension), benign Current hypertension medications:       Sig   hydrochlorothiazide  (HYDRODIURIL ) 25 MG tablet (Taking) TAKE 1 TABLET(25 MG) BY MOUTH DAILY   metolazone  (ZAROXOLYN ) 5 MG tablet (Taking As Needed) Take 1 tablet (5 mg total) by mouth daily as needed. For swelling only   metoprolol  succinate (TOPROL -XL) 25 MG 24 hr tablet (Taking) Take 1 tablet (25 mg total) by mouth  daily.   NIFEdipine  (PROCARDIA  XL/NIFEDICAL XL) 60 MG 24 hr tablet (Taking) Take 2 tablets (120 mg total) by mouth at bedtime.   olmesartan  (BENICAR ) 40 MG tablet (Taking) Take 1 tablet (40 mg total) by mouth at bedtime.   spironolactone  (ALDACTONE ) 25 MG tablet (Taking) Take 0.5 tablets (12.5 mg total) by mouth every morning.      Chronic, continues to be uncontrolled despite multiple medications, I am concerned that he has something else underlying that is causing the persistently elevated BP, he is a heavy smoker which is contributing but also has been diagnosed with OSA in the past and it is currently untreated. States he no longer has a machine, will order a sleep study to confirm and order a new CPAP. Also send for a renal artery  duplex.   -     VAS US  RENAL ARTERY DUPLEX; Future     No follow-ups on file.    Heron CHRISTELLA Sharper, MD

## 2024-05-13 NOTE — Patient Instructions (Signed)
 [phone] option 1  -- this will get you to the GI doctor and they can schedule your colonoscopy.

## 2024-05-14 ENCOUNTER — Other Ambulatory Visit: Payer: Self-pay

## 2024-05-17 ENCOUNTER — Encounter: Payer: Self-pay | Admitting: Family Medicine

## 2024-05-17 DIAGNOSIS — R059 Cough, unspecified: Secondary | ICD-10-CM

## 2024-05-17 NOTE — Telephone Encounter (Signed)
 Pt is not finished with his treatment yet-- he should still have abx and steroids, please have him continue his inhalers at home as well. We could order a chest x-ray if he would like, if so then ok to place the order and have him go to either [address] or here on thursday

## 2024-05-18 NOTE — Addendum Note (Signed)
 Addended by: CHRISTYNE IDELL LABOR on: 05/18/2024 01:52 PM   Modules accepted: Orders

## 2024-05-24 ENCOUNTER — Ambulatory Visit (HOSPITAL_COMMUNITY): Attending: Family Medicine

## 2024-06-07 ENCOUNTER — Other Ambulatory Visit: Payer: Self-pay | Admitting: Family Medicine

## 2024-06-07 ENCOUNTER — Other Ambulatory Visit: Payer: Self-pay

## 2024-06-07 DIAGNOSIS — K219 Gastro-esophageal reflux disease without esophagitis: Secondary | ICD-10-CM

## 2024-06-07 MED ORDER — PANTOPRAZOLE SODIUM 40 MG PO TBEC
40.0000 mg | DELAYED_RELEASE_TABLET | Freq: Every morning | ORAL | 1 refills | Status: AC
  Filled 2024-06-07: qty 90, 90d supply, fill #0
  Filled 2024-06-10: qty 30, 30d supply, fill #0
  Filled 2024-07-06: qty 30, 30d supply, fill #1
  Filled 2024-07-28 – 2024-07-29 (×2): qty 30, 30d supply, fill #2
  Filled 2024-09-13: qty 30, 30d supply, fill #3
  Filled 2024-10-12: qty 30, 30d supply, fill #4

## 2024-06-10 ENCOUNTER — Other Ambulatory Visit (HOSPITAL_COMMUNITY): Payer: Self-pay

## 2024-06-10 ENCOUNTER — Other Ambulatory Visit: Payer: Self-pay

## 2024-06-10 DIAGNOSIS — M4326 Fusion of spine, lumbar region: Secondary | ICD-10-CM | POA: Diagnosis not present

## 2024-06-10 DIAGNOSIS — M5416 Radiculopathy, lumbar region: Secondary | ICD-10-CM | POA: Diagnosis not present

## 2024-06-10 DIAGNOSIS — M48061 Spinal stenosis, lumbar region without neurogenic claudication: Secondary | ICD-10-CM | POA: Diagnosis not present

## 2024-06-11 ENCOUNTER — Other Ambulatory Visit: Payer: Self-pay

## 2024-06-11 ENCOUNTER — Ambulatory Visit: Admitting: Family Medicine

## 2024-06-11 ENCOUNTER — Ambulatory Visit (HOSPITAL_COMMUNITY): Admission: RE | Admit: 2024-06-11 | Source: Ambulatory Visit

## 2024-06-16 ENCOUNTER — Ambulatory Visit (HOSPITAL_BASED_OUTPATIENT_CLINIC_OR_DEPARTMENT_OTHER): Attending: Sports Medicine | Admitting: Physical Therapy

## 2024-06-16 DIAGNOSIS — M5416 Radiculopathy, lumbar region: Secondary | ICD-10-CM | POA: Diagnosis not present

## 2024-06-16 NOTE — Therapy (Incomplete)
 OUTPATIENT PHYSICAL THERAPY LOWER EXTREMITY EVALUATION   Patient Name: [name] [name] MRN: [id number] DOB:May 01, 1951, 73 y.o., male Today's Date: 06/16/2024  END OF SESSION:   Past Medical History:  Diagnosis Date   Anxiety and depression    Back pain    Diverticulitis    Diverticulosis    DVT (deep venous thrombosis) (HCC)    Gout    Hernia    History of blood clots    Hypertension    Lesion of right native kidney 01/03/2023   Nausea and vomiting    Neuropathy, peripheral 05/15/2012   patient denies   Shingles    Past Surgical History:  Procedure Laterality Date   APPENDECTOMY     BACK SURGERY     CHOLECYSTECTOMY  09/09/2006   HERNIA REPAIR     HIATAL HERNIA REPAIR     IR ANGIO INTRA EXTRACRAN SEL INTERNAL CAROTID BILAT MOD SED  01/18/2020   IR ANGIO VERTEBRAL SEL VERTEBRAL BILAT MOD SED  01/18/2020   IR US  GUIDE VASC ACCESS RIGHT  01/18/2020   LEFT HEART CATHETERIZATION WITH CORONARY ANGIOGRAM N/A 08/02/2014   Procedure: LEFT HEART CATHETERIZATION WITH CORONARY ANGIOGRAM;  Surgeon: Erick JONELLE Bergamo, MD;  Location: [facility] CATH LAB;  Service: Cardiovascular;  Laterality: N/A;   MULTIPLE EXTRACTIONS WITH ALVEOLOPLASTY N/A 10/19/2015   Procedure: Extraction of tooth #'s 2,12,13 with alveoloplasty;  Surgeon: Tanda JULIANNA Fanny, DDS;  Location: [facility] OR;  Service: Oral Surgery;  Laterality: N/A;   SACRAL NERVE STIMULATOR PLACEMENT  09/09/2009   [employer] [phone] MRI info-[email or web address]   Patient Active Problem List   Diagnosis Date Noted   COPD with acute exacerbation (HCC) 12/15/2023   Tobacco abuse counseling 12/15/2023   Acute hypoxic respiratory failure (HCC) 12/11/2023   Pulmonary emphysema (HCC) 08/20/2023   Vitamin D  deficiency 08/20/2023   Iron  deficiency anemia secondary to inadequate dietary iron  intake 08/20/2023   Right hip pain 05/15/2023   Tobacco dependence 05/15/2023   Lumbar  radiculopathy 03/11/2023   Spinal stenosis of lumbar region 02/28/2023   HTN (hypertension), benign 02/28/2023   History of DVT (deep vein thrombosis) 02/28/2023   Hyperlipidemia 02/28/2023   Depression with anxiety 02/28/2023   Microcytic anemia 02/28/2023   Renal mass; right 01/08/2023   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   BPH with obstruction/lower urinary tract symptoms 01/08/2023   Chronic prostatitis 01/08/2023   Lesion of right native kidney 01/03/2023   Tick bite of right thigh 01/03/2023   Moderate episode of recurrent major depressive disorder (HCC) 01/03/2023   Right flank pain 05/01/2021   Chronic right-sided back pain 05/01/2021   Chronic diarrhea 05/01/2021   Primary osteoarthritis of left knee 03/29/2020   Primary osteoarthritis of right knee 07/08/2017   Complete rotator cuff tear of left shoulder 10/21/2016   Gout attack 04/02/2016   Angina pectoris 08/01/2014   Hemorrhage of rectum and anus 03/28/2014   Neuropathy, peripheral 05/15/2012   Pulmonary embolism (HCC) 07/11/2011   DVT (deep venous thrombosis) (HCC) 07/11/2011   Abdominal pain, left lower quadrant 01/29/2011   Long term current use of anticoagulant therapy 01/29/2011   Diarrhea 01/29/2011    PCP: Heron Sharper MD  REFERRING PROVIDER: Lavelle Ada MD  REFERRING DIAG: R52 (ICD-10-CM) - Pain, unspecified   THERAPY DIAG:  No diagnosis found.  Rationale for Evaluation and Treatment: Rehabilitation  ONSET DATE: ***  SUBJECTIVE:   SUBJECTIVE STATEMENT: Received bilateral cortizone injections 03/17/24  PERTINENT HISTORY: Bilateral primary osteoarthritis of knee   L3-L5 laminectomy and fusion with  TLIF 03/18/23 (revision)  PAIN:  Are you having pain? Yes: NPRS scale: *** Pain location: knees R>L Pain description: *** Aggravating factors: *** Relieving factors: ***  PRECAUTIONS: {Therapy precautions:24002}  RED FLAGS: None   WEIGHT BEARING RESTRICTIONS: No  FALLS:  Has patient fallen in  last 6 months? 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  DIAGNOSTIC FINDINGS:  XR Knee Bilateral Ap Lateral and Oblique Narrative: Standing x-rays of both knees, sunrise and lateral view of both knees  performed and reviewed in the office today show severe tricompartmental  arthritis bilaterally. Impression: There is osteopenia.   PATIENT SURVEYS:  LEFS  COGNITION: Overall cognitive status: Within functional limits for tasks assessed     SENSATION: WFL  EDEMA:  Mild edema R>L  MUSCLE LENGTH: Hamstrings: Right *** deg; Left *** deg   POSTURE: rounded shoulders, forward head, increased thoracic kyphosis, and s/p multiple lx surgeries   PALPATION: ***  LOWER EXTREMITY ROM:  {AROM/PROM:27142} ROM Right eval Left eval  Hip flexion    Hip extension    Hip abduction    Hip adduction    Hip internal rotation    Hip external rotation    Knee flexion 125 125  Knee extension 5 5  Ankle dorsiflexion    Ankle plantarflexion    Ankle inversion    Ankle eversion     (Blank rows = not tested)  LOWER EXTREMITY MMT:  MMT Right eval Left eval  Hip flexion    Hip extension    Hip abduction    Hip adduction    Hip internal rotation    Hip external rotation    Knee flexion    Knee extension    Ankle dorsiflexion    Ankle plantarflexion    Ankle inversion    Ankle eversion     (Blank rows = not tested)   FUNCTIONAL TESTS:  {Functional tests:24029}  GAIT: Distance walked: *** Assistive device utilized: {Assistive devices:23999} Level of assistance: {Levels  of assistance:24026} Comments: ***                                                                                                                                TREATMENT  Eval Self care:Posture and Optometrist instruction; use of AD    PATIENT EDUCATION:  Education details: Discussed eval findings, rehab rationale, aquatic program progression/POC and pools in area. Patient is in agreement  Person educated: Patient Education method: Explanation Education comprehension: verbalized understanding  HOME EXERCISE PROGRAM: TBA  ASSESSMENT:  CLINICAL IMPRESSION: Patient is a 73 y.o. m  who was seen today for physical therapy evaluation and treatment for bilateral knee pain.   OBJECTIVE IMPAIRMENTS: Abnormal gait, decreased mobility, difficulty walking, decreased ROM, decreased strength, increased fascial restrictions, impaired perceived functional ability, increased muscle spasms, impaired flexibility, impaired sensation, obesity, and pain.    ACTIVITY LIMITATIONS: carrying, lifting, bending, sitting, standing, squatting, sleeping, stairs, transfers, bed mobility, reach over head, hygiene/grooming, and locomotion level   PARTICIPATION LIMITATIONS: meal prep, cleaning, laundry, driving, shopping, community activity, yard work, and church   PERSONAL FACTORS: Age, Behavior pattern, Fitness, Past/current experiences, and Time since onset of injury/illness/exacerbation are also affecting patient's functional outcome.   REHAB POTENTIAL: {rehabpotential:25112}  CLINICAL DECISION MAKING: {clinical decision making:25114}  EVALUATION COMPLEXITY: {Evaluation complexity:25115}   GOALS: Goals reviewed with patient? Yes  SHORT TERM GOALS: Target date: *** Pt will tolerate full aquatic sessions consistently without increase in pain and with improving function to demonstrate good toleration and effectiveness of intervention.  Baseline: Goal status: INITIAL  2.  Pt will consider gaining  pool access for use of the properties of water  for chronic conditions maintaining mobility and minimizing pain. Baseline:  Goal status: INITIAL  3.  *** Baseline:  Goal status: INITIAL  4.  *** Baseline:  Goal status: INITIAL  5.  *** Baseline:  Goal status: INITIAL  6.  *** Baseline:  Goal status: INITIAL  LONG TERM GOALS: Target date: ***  Pt to improve on LEFS by at least 9 point to demonstrate statistically significant Improvement in function. Baseline:  Goal status: INITIAL  2.  Pt will report decrease in pain by at least 50% for improved toleration to activity/quality of life and to demonstrate improved management of pain. Baseline:  Goal status: INITIAL  3.  Pt will tolerate stair climbing using alternating pattern ascending and descending 6 steps without use of handrail Baseline:  Goal status: INITIAL  4.  Pt will improve strength in all areas listed by    to demonstrate improved overall physical function Baseline:  Goal status: INITIAL  5.  *** Baseline:  Goal status: INITIAL  6.  *** Baseline:  Goal status: INITIAL   PLAN:  PT FREQUENCY: {rehab frequency:25116}  PT DURATION: {rehab duration:25117}  PLANNED INTERVENTIONS: 97164- PT Re-evaluation, 97750- Physical Performance Testing, 97110-Therapeutic exercises, 97530- Therapeutic activity, 97112- Neuromuscular re-education, 97535- Self Care, 02859- Manual therapy, [id number]- Gait training, [id number]- Aquatic Therapy, [phone] (1-2 muscles), 20561 (3+ muscles)- Dry Needling, Patient/Family education, Balance training, Stair training, Taping, Joint mobilization, DME instructions, Cryotherapy, and Moist heat  PLAN FOR NEXT SESSION: PIERRETTE Shuck [place]) Tyris Eliot MPT 06/16/24 10:28 AM [facility] [address] [place], [place], [address] Phone: [phone]   Fax:  [phone]

## 2024-06-25 ENCOUNTER — Ambulatory Visit (HOSPITAL_COMMUNITY)
Admission: RE | Admit: 2024-06-25 | Discharge: 2024-06-25 | Disposition: A | Discharge status: Home / Self Care | Source: Ambulatory Visit | Attending: Family Medicine | Admitting: Family Medicine

## 2024-06-25 ENCOUNTER — Ambulatory Visit: Payer: Self-pay | Admitting: Family Medicine

## 2024-06-25 DIAGNOSIS — I10 Essential (primary) hypertension: Secondary | ICD-10-CM | POA: Diagnosis not present

## 2024-06-28 NOTE — Telephone Encounter (Signed)
 Ordered a home sleep study -- would you be able to follow up on this for the patient? Thanks!

## 2024-06-30 ENCOUNTER — Telehealth: Payer: Self-pay | Admitting: *Deleted

## 2024-06-30 ENCOUNTER — Other Ambulatory Visit: Payer: Self-pay

## 2024-06-30 NOTE — Telephone Encounter (Signed)
 Copied from CRM [id number]. Topic: Clinical - Lab/Test Results >> Jun 30, 2024 10:39 AM [facility] ORN wrote: Reason for CRM: Pt would like to pick up TB results from 3 months ago. Please call back when ready for pick up

## 2024-06-30 NOTE — Telephone Encounter (Signed)
 Spoke with the patient's spouse and informed her the copy of PPD results from May was left at the front desk for pick up.

## 2024-07-01 ENCOUNTER — Other Ambulatory Visit (HOSPITAL_COMMUNITY): Payer: Self-pay

## 2024-07-02 ENCOUNTER — Other Ambulatory Visit: Payer: Self-pay

## 2024-07-02 ENCOUNTER — Emergency Department (HOSPITAL_COMMUNITY)
Admission: EM | Admit: 2024-07-02 | Discharge: 2024-07-02 | Disposition: A | Discharge status: Home / Self Care | Attending: Emergency Medicine | Admitting: Emergency Medicine

## 2024-07-02 ENCOUNTER — Emergency Department (HOSPITAL_COMMUNITY)

## 2024-07-02 DIAGNOSIS — M545 Low back pain, unspecified: Secondary | ICD-10-CM | POA: Diagnosis not present

## 2024-07-02 DIAGNOSIS — R109 Unspecified abdominal pain: Secondary | ICD-10-CM | POA: Diagnosis not present

## 2024-07-02 DIAGNOSIS — Z79899 Other long term (current) drug therapy: Secondary | ICD-10-CM | POA: Diagnosis not present

## 2024-07-02 DIAGNOSIS — Z7901 Long term (current) use of anticoagulants: Secondary | ICD-10-CM | POA: Insufficient documentation

## 2024-07-02 DIAGNOSIS — I10 Essential (primary) hypertension: Secondary | ICD-10-CM | POA: Diagnosis not present

## 2024-07-02 DIAGNOSIS — G8929 Other chronic pain: Secondary | ICD-10-CM | POA: Insufficient documentation

## 2024-07-02 DIAGNOSIS — J449 Chronic obstructive pulmonary disease, unspecified: Secondary | ICD-10-CM | POA: Diagnosis not present

## 2024-07-02 DIAGNOSIS — K5730 Diverticulosis of large intestine without perforation or abscess without bleeding: Secondary | ICD-10-CM | POA: Diagnosis not present

## 2024-07-02 DIAGNOSIS — D3501 Benign neoplasm of right adrenal gland: Secondary | ICD-10-CM | POA: Insufficient documentation

## 2024-07-02 DIAGNOSIS — M5441 Lumbago with sciatica, right side: Secondary | ICD-10-CM | POA: Diagnosis not present

## 2024-07-02 LAB — CBC WITH DIFFERENTIAL/PLATELET
Abs Immature Granulocytes: 0.01 K/uL (ref 0.00–0.07)
Basophils Absolute: 0 K/uL (ref 0.0–0.1)
Basophils Relative: 0 %
Eosinophils Absolute: 0 K/uL (ref 0.0–0.5)
Eosinophils Relative: 0 %
HCT: 37.3 % — ABNORMAL LOW (ref 39.0–52.0)
Hemoglobin: 11.1 g/dL — ABNORMAL LOW (ref 13.0–17.0)
Immature Granulocytes: 0 %
Lymphocytes Relative: 12 %
Lymphs Abs: 0.7 K/uL (ref 0.7–4.0)
MCH: 20.7 pg — ABNORMAL LOW (ref 26.0–34.0)
MCHC: 29.8 g/dL — ABNORMAL LOW (ref 30.0–36.0)
MCV: 69.7 fL — ABNORMAL LOW (ref 80.0–100.0)
Monocytes Absolute: 0.3 K/uL (ref 0.1–1.0)
Monocytes Relative: 6 %
Neutro Abs: 4.4 K/uL (ref 1.7–7.7)
Neutrophils Relative %: 82 %
Platelets: 268 K/uL (ref 150–400)
RBC: 5.35 MIL/uL (ref 4.22–5.81)
RDW: 17.3 % — ABNORMAL HIGH (ref 11.5–15.5)
WBC: 5.5 K/uL (ref 4.0–10.5)
nRBC: 0 % (ref 0.0–0.2)

## 2024-07-02 LAB — COMPREHENSIVE METABOLIC PANEL WITH GFR
ALT: 9 U/L (ref 0–44)
AST: 18 U/L (ref 15–41)
Albumin: 4.5 g/dL (ref 3.5–5.0)
Alkaline Phosphatase: 51 U/L (ref 38–126)
Anion gap: 8 (ref 5–15)
BUN: 8 mg/dL (ref 8–23)
CO2: 32 mmol/L (ref 22–32)
Calcium: 9.6 mg/dL (ref 8.9–10.3)
Chloride: 104 mmol/L (ref 98–111)
Creatinine, Ser: 0.83 mg/dL (ref 0.61–1.24)
GFR, Estimated: >60 mL/min (ref >60)
Glucose, Bld: 107 mg/dL — ABNORMAL HIGH (ref 70–99)
Potassium: 4.4 mmol/L (ref 3.5–5.1)
Sodium: 143 mmol/L (ref 135–145)
Total Bilirubin: 0.4 mg/dL (ref 0.0–1.2)
Total Protein: 7.5 g/dL (ref 6.5–8.1)

## 2024-07-02 LAB — URINALYSIS, ROUTINE W REFLEX MICROSCOPIC
Bilirubin Urine: NEGATIVE
Glucose, UA: NEGATIVE mg/dL
Hgb urine dipstick: NEGATIVE
Ketones, ur: NEGATIVE mg/dL
Leukocytes,Ua: NEGATIVE
Nitrite: NEGATIVE
Protein, ur: NEGATIVE mg/dL
Specific Gravity, Urine: 1.018 (ref 1.005–1.030)
pH: 7 (ref 5.0–8.0)

## 2024-07-02 LAB — I-STAT CHEM 8, ED
BUN: 6 mg/dL — ABNORMAL LOW (ref 8–23)
Calcium, Ion: 1.2 mmol/L (ref 1.15–1.40)
Chloride: 100 mmol/L (ref 98–111)
Creatinine, Ser: 0.9 mg/dL (ref 0.61–1.24)
Glucose, Bld: 104 mg/dL — ABNORMAL HIGH (ref 70–99)
HCT: 38 % — ABNORMAL LOW (ref 39.0–52.0)
Hemoglobin: 12.9 g/dL — ABNORMAL LOW (ref 13.0–17.0)
Potassium: 4.1 mmol/L (ref 3.5–5.1)
Sodium: 143 mmol/L (ref 135–145)
TCO2: 30 mmol/L (ref 22–32)

## 2024-07-02 LAB — LIPASE, BLOOD: Lipase: 21 U/L (ref 11–51)

## 2024-07-02 MED ORDER — OXYCODONE-ACETAMINOPHEN 5-325 MG PO TABS
1.0000 | ORAL_TABLET | Freq: Once | ORAL | Status: AC
  Administered 2024-07-02: 1 via ORAL
  Filled 2024-07-02: qty 1

## 2024-07-02 MED ORDER — IOHEXOL 350 MG/ML SOLN
100.0000 mL | Freq: Once | INTRAVENOUS | Status: AC | PRN
  Administered 2024-07-02: 100 mL via INTRAVENOUS

## 2024-07-02 MED ORDER — METHYLPREDNISOLONE 4 MG PO TBPK: ORAL_TABLET | ORAL | 0 refills | Status: DC

## 2024-07-02 MED ORDER — MORPHINE SULFATE (PF) 4 MG/ML IV SOLN
4.0000 mg | Freq: Once | INTRAVENOUS | Status: AC
  Administered 2024-07-02: 4 mg via INTRAVENOUS
  Filled 2024-07-02: qty 1

## 2024-07-02 MED ORDER — ONDANSETRON HCL 4 MG/2ML IJ SOLN
4.0000 mg | Freq: Once | INTRAMUSCULAR | Status: AC
  Administered 2024-07-02: 4 mg via INTRAVENOUS
  Filled 2024-07-02: qty 2

## 2024-07-02 MED ORDER — OXYCODONE HCL 5 MG PO TABS
5.0000 mg | ORAL_TABLET | Freq: Once | ORAL | Status: AC
  Administered 2024-07-02: 5 mg via ORAL
  Filled 2024-07-02: qty 1

## 2024-07-02 MED ORDER — LACTATED RINGERS IV BOLUS
1000.0000 mL | Freq: Once | INTRAVENOUS | Status: AC
  Administered 2024-07-02: 1000 mL via INTRAVENOUS

## 2024-07-02 NOTE — ED Provider Triage Note (Signed)
 Emergency Medicine Provider Triage Evaluation Note  [name] [name] , a 73 y.o. male  was evaluated in triage.  Pt complains of left flank pain and dysuria started yesterday. Radiates into left groin. Also fell into wall yesterday then had back pain following. No head injury, loc, nor complaints prior to injury  Hx of pinched nerve. On xarelto   No saddle paresthesia nor urinary incontinence   Review of Systems  Positive: See hpi Negative: fevers  Physical Exam  BP (!) 218/88   Pulse 88   Temp 98 F (36.7 C)   Resp 16   SpO2 100%  Gen:   Awake, no distress   Resp:  Normal effort  MSK:   Moves extremities without difficulty  Other:  TTP lumbar spine and paraspinous musculature right  Medical Decision Making  Medically screening exam initiated at 3:13 PM.  Appropriate orders placed.  [name] [name] was informed that the remainder of the evaluation will be completed by another provider, this initial triage assessment does not replace that evaluation, and the importance of remaining in the ED until their evaluation is complete.  Labs and ct ordered   [name] [name], [name] [name] 07/02/24 1517

## 2024-07-02 NOTE — ED Provider Notes (Signed)
 [place] EMERGENCY DEPARTMENT AT [facility] Provider Note  CSN: [id number] Arrival date & time: 07/02/24 1428  Chief Complaint(s) Back Pain  HPI [name] [name] is a 73 y.o. male who is here today for back pain.  Patient has a history of chronic back pain, status post L3-L5 laminectomy.  He is on Xarelto  for history of DVTs.  Patient is here today with some pain on his left side.  He has chronic right sided back pain, however he is states he lost his balance and stumbled forward, catching himself not falling to the ground, had some pain in his left groin.  He also states that he had some burning with urination.  Reviewed the patient's most recent spine office visit on 10/2 Past Medical History Past Medical History:  Diagnosis Date   Anxiety and depression    Back pain    Diverticulitis    Diverticulosis    DVT (deep venous thrombosis) (HCC)    Gout    Hernia    History of blood clots    Hypertension    Lesion of right native kidney 01/03/2023   Nausea and vomiting    Neuropathy, peripheral 05/15/2012   patient denies   Shingles    Patient Active Problem List   Diagnosis Date Noted   COPD with acute exacerbation (HCC) 12/15/2023   Tobacco abuse counseling 12/15/2023   Acute hypoxic respiratory failure (HCC) 12/11/2023   Pulmonary emphysema (HCC) 08/20/2023   Vitamin D  deficiency 08/20/2023   Iron  deficiency anemia secondary to inadequate dietary iron  intake 08/20/2023   Right hip pain 05/15/2023   Tobacco dependence 05/15/2023   Lumbar radiculopathy 03/11/2023   Spinal stenosis of lumbar region 02/28/2023   HTN (hypertension), benign 02/28/2023   History of DVT (deep vein thrombosis) 02/28/2023   Hyperlipidemia 02/28/2023   Depression with anxiety 02/28/2023   Microcytic anemia 02/28/2023   Renal mass; right 01/08/2023   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   BPH with obstruction/lower urinary tract symptoms 01/08/2023   Chronic prostatitis 01/08/2023   Lesion of  right native kidney 01/03/2023   Tick bite of right thigh 01/03/2023   Moderate episode of recurrent major depressive disorder (HCC) 01/03/2023   Right flank pain 05/01/2021   Chronic right-sided back pain 05/01/2021   Chronic diarrhea 05/01/2021   Primary osteoarthritis of left knee 03/29/2020   Primary osteoarthritis of right knee 07/08/2017   Complete rotator cuff tear of left shoulder 10/21/2016   Gout attack 04/02/2016   Angina pectoris 08/01/2014   Hemorrhage of rectum and anus 03/28/2014   Neuropathy, peripheral 05/15/2012   Pulmonary embolism (HCC) 07/11/2011   DVT (deep venous thrombosis) (HCC) 07/11/2011   Abdominal pain, left lower quadrant 01/29/2011   Long term current use of anticoagulant therapy 01/29/2011   Diarrhea 01/29/2011   Home Medication(s) Prior to Admission medications   Medication Sig Start Date End Date Taking? Authorizing Provider  methylPREDNISolone  (MEDROL  DOSEPAK) 4 MG TBPK tablet Take as instructed by dose packaging 07/02/24  Yes Mannie Pac T, DO  albuterol  (PROVENTIL  HFA;VENTOLIN  HFA) 108 (90 Base) MCG/ACT inhaler Inhale 2 puffs into the lungs every 6 (six) hours as needed for wheezing or shortness of breath. 01/27/17   Charlyn Sora, MD  albuterol  (PROVENTIL ) (2.5 MG/3ML) 0.083% nebulizer solution Take 2.5 mg by nebulization every 6 (six) hours as needed for wheezing or shortness of breath. 02/04/23   [provider]  alfuzosin  (UROXATRAL ) 10 MG 24 hr tablet Take 1 tablet (10 mg total) by mouth  every morning 12/09/23   Ozell Heron HERO, MD  ARIPiprazole  (ABILIFY ) 10 MG tablet TAKE 1 TABLET(10 MG) BY MOUTH DAILY 02/26/24   Ozell Heron HERO, MD  ARIPiprazole  (ABILIFY ) 10 MG tablet Take 1 tablet (10 mg total) by mouth daily. 02/26/24   Ozell Heron HERO, MD  atorvastatin  (LIPITOR) 20 MG tablet Take 1 tablet (20 mg total) by mouth in the morning 12/09/23   Ozell Heron HERO, MD  BREO ELLIPTA  100-25 MCG/ACT AEPB Inhale 1 puff into the lungs  daily. 12/03/23   [provider]  Budeson-Glycopyrrol-Formoterol (BREZTRI  AEROSPHERE) 160-9-4.8 MCG/ACT AERO Inhale 2 puffs into the lungs 2 (two) times daily. 08/14/23   Ozell Heron HERO, MD  cyclobenzaprine  (FLEXERIL ) 10 MG tablet Take 1 tablet (10 mg total) by mouth 3 (three) times daily as needed for muscle spasms. 10/16/23   Ozell Heron HERO, MD  DULoxetine  (CYMBALTA ) 20 MG capsule Take 1 capsule (20 mg total) by mouth every morning. 12/09/23   Ozell Heron HERO, MD  DULoxetine  (CYMBALTA ) 60 MG capsule TAKE 1 CAPSULE(60 MG) BY MOUTH DAILY 02/03/24   Ozell Heron HERO, MD  GELSYN-3 16.8 MG/2ML SOSY intra-articular injection  11/12/23   [provider]  hydrochlorothiazide  (HYDRODIURIL ) 25 MG tablet TAKE 1 TABLET(25 MG) BY MOUTH DAILY 01/10/24   Ozell Heron HERO, MD  metolazone  (ZAROXOLYN ) 5 MG tablet Take 1 tablet (5 mg total) by mouth daily as needed. For swelling only 01/06/24   Ozell Heron HERO, MD  metoprolol  succinate (TOPROL -XL) 25 MG 24 hr tablet Take 1 tablet (25 mg total) by mouth daily. 03/09/24   Ozell Heron HERO, MD  nicotine  (NICODERM CQ  - DOSED IN MG/24 HOURS) 14 mg/24hr patch Place 1 patch (14 mg total) onto the skin daily. 08/14/23   Ozell Heron HERO, MD  nicotine  (NICODERM CQ  - DOSED IN MG/24 HOURS) 21 mg/24hr patch Place 1 patch (21 mg total) onto the skin daily. 08/14/23   Ozell Heron HERO, MD  nicotine  (NICODERM CQ  - DOSED IN MG/24 HR) 7 mg/24hr patch Place 1 patch (7 mg total) onto the skin daily. 08/14/23   Ozell Heron HERO, MD  NIFEdipine  (PROCARDIA  XL/NIFEDICAL XL) 60 MG 24 hr tablet Take 2 tablets (120 mg total) by mouth at bedtime. 12/09/23   Ozell Heron HERO, MD  olmesartan  (BENICAR ) 40 MG tablet Take 1 tablet (40 mg total) by mouth at bedtime. 12/09/23   Ozell Heron HERO, MD  ondansetron  (ZOFRAN ) 4 MG tablet Take 1 tablet (4 mg total) by mouth every 8 (eight) hours as needed for nausea or vomiting. 03/09/24   Ozell Heron HERO, MD  Oxycodone  HCl 10 MG  TABS Take 10 mg by mouth in the morning, at noon, in the evening, and at bedtime. 08/12/23   [provider]  pantoprazole  (PROTONIX ) 40 MG tablet Take 1 tablet (40 mg total) by mouth every morning 06/07/24   Ozell Heron HERO, MD  potassium chloride  SA (KLOR-CON  M) 20 MEQ tablet Take 1 tablet (20 mEq total) by mouth daily. 02/11/24   Ozell Heron HERO, MD  rivaroxaban  (XARELTO ) 20 MG TABS tablet Take 1 tablet (20 mg total) by mouth at bedtime. 12/09/23   Ozell Heron HERO, MD  spironolactone  (ALDACTONE ) 25 MG tablet Take 0.5 tablets (12.5 mg total) by mouth every morning. 12/09/23   Ozell Heron HERO, MD  Past Surgical History Past Surgical History:  Procedure Laterality Date   APPENDECTOMY     BACK SURGERY     CHOLECYSTECTOMY  09/09/2006   HERNIA REPAIR     HIATAL HERNIA REPAIR     IR ANGIO INTRA EXTRACRAN SEL INTERNAL CAROTID BILAT MOD SED  01/18/2020   IR ANGIO VERTEBRAL SEL VERTEBRAL BILAT MOD SED  01/18/2020   IR US  GUIDE VASC ACCESS RIGHT  01/18/2020   LEFT HEART CATHETERIZATION WITH CORONARY ANGIOGRAM N/A 08/02/2014   Procedure: LEFT HEART CATHETERIZATION WITH CORONARY ANGIOGRAM;  Surgeon: Erick JONELLE Bergamo, MD;  Location: [facility] CATH LAB;  Service: Cardiovascular;  Laterality: N/A;   MULTIPLE EXTRACTIONS WITH ALVEOLOPLASTY N/A 10/19/2015   Procedure: Extraction of tooth #'s 2,12,13 with alveoloplasty;  Surgeon: Tanda JULIANNA Fanny, DDS;  Location: [facility] OR;  Service: Oral Surgery;  Laterality: N/A;   SACRAL NERVE STIMULATOR PLACEMENT  09/09/2009   [employer] [phone] MRI info-[email or web address]   Family History Family History  Problem Relation Age of Onset   Prostate cancer Father    Heart disease Father    Diabetes Mother    Heart disease Mother    Colon cancer Maternal Uncle         dx in his 45's   Pancreatic cancer Paternal Uncle    Prostate cancer Paternal Uncle    Multiple sclerosis Daughter    Prostate cancer Paternal Uncle    Esophageal cancer Neg Hx    Rectal cancer Neg Hx    Stomach cancer Neg Hx     Social History Social History   Tobacco Use   Smoking status: Every Day    Current packs/day: 0.50    Average packs/day: 0.5 packs/day for 40.0 years (20.0 ttl pk-yrs)    Types: Cigarettes   Smokeless tobacco: Never  Vaping Use   Vaping status: Never Used  Substance Use Topics   Alcohol  use: Yes    Alcohol /week: 0.0 standard drinks of alcohol     Comment: Occasional   Drug use: Yes    Types: Marijuana    Comment: for nausea   Allergies Nsaids, Aspirin, Celecoxib, Ibuprofen, Pregabalin, and Vicodin [hydrocodone -acetaminophen ]  Review of Systems Review of Systems  Physical Exam Vital Signs  I have reviewed the triage vital signs BP (!) 179/103 (BP Location: Right Arm)   Pulse 90   Temp 98.6 F (37 C) (Oral)   Resp 17   SpO2 94%   Physical Exam Vitals and nursing note reviewed.  HENT:     Head: Normocephalic.  Eyes:     Pupils: Pupils are equal, round, and reactive to light.  Cardiovascular:     Rate and Rhythm: Normal rate.  Pulmonary:     Effort: Pulmonary effort is normal.  Abdominal:     General: Abdomen is flat.     Palpations: Abdomen is soft.  Musculoskeletal:        General: Normal range of motion.  Skin:    General: Skin is warm.  Neurological:     General: No focal deficit present.     Mental Status: He is alert.     Comments: Patient Dors is having some numbness in his right leg which he reports is chronic, and this is confirmed by his spine office notes.  He has 5-5 strength with plantar and dorsi flexion.  4-5 straight leg raise bilaterally.  Patient was able to ambulate with a cane to the emergency room.  No saddle anesthesia.     ED Results and Treatments Labs (  all labs ordered are listed, but only abnormal  results are displayed) Labs Reviewed  COMPREHENSIVE METABOLIC PANEL WITH GFR - Abnormal; Notable for the following components:      Result Value   Glucose, Bld 107 (*)    All other components within normal limits  CBC WITH DIFFERENTIAL/PLATELET - Abnormal; Notable for the following components:   Hemoglobin 11.1 (*)    HCT 37.3 (*)    MCV 69.7 (*)    MCH 20.7 (*)    MCHC 29.8 (*)    RDW 17.3 (*)    All other components within normal limits  I-STAT CHEM 8, ED - Abnormal; Notable for the following components:   BUN 6 (*)    Glucose, Bld 104 (*)    Hemoglobin 12.9 (*)    HCT 38.0 (*)    All other components within normal limits  URINALYSIS, ROUTINE W REFLEX MICROSCOPIC  LIPASE, BLOOD                                                                                                                          Radiology CT Angio Chest/Abd/Pel for Dissection W and/or W/WO Result Date: 07/02/2024 EXAM: CTA CHEST, ABDOMEN AND PELVIS WITH AND WITHOUT CONTRAST 07/02/2024 06:24:45 PM TECHNIQUE: CTA of the chest was performed with and without the administration of intravenous contrast. CTA of the abdomen and pelvis was performed with and without the administration of intravenous contrast. 100 mL of iohexol  (OMNIPAQUE ) 350 MG/ML injection was administered. Multiplanar reformatted images are provided for review. MIP images are provided for review. Automated exposure control, iterative reconstruction, and/or weight based adjustment of the mA/kV was utilized to reduce the radiation dose to as low as reasonably achievable. COMPARISON: CT abdomen and pelvis earlier today and CT chest dated 06/02/2023. CLINICAL HISTORY: Acute aortic syndrome (AAS) suspected. FINDINGS: VASCULATURE: AORTA: Thoracic aortic atherosclerosis. Atherosclerotic calcifications of the abdominal aorta and branch vessels, although patent. No abdominal aortic aneurysm. No dissection. PULMONARY ARTERIES: No pulmonary embolism with the limits of  this exam. GREAT VESSELS OF AORTIC ARCH: No acute finding. No dissection. No arterial occlusion or significant stenosis. CELIAC TRUNK: Atherosclerotic calcifications, although patent. No acute finding. No occlusion or significant stenosis. SUPERIOR MESENTERIC ARTERY: Atherosclerotic calcifications, although patent. No acute finding. No occlusion or significant stenosis. INFERIOR MESENTERIC ARTERY: Atherosclerotic calcifications, although patent. No acute finding. No occlusion or significant stenosis. RENAL ARTERIES: Atherosclerotic calcifications, although patent. No acute finding. No occlusion or significant stenosis. ILIAC ARTERIES: Atherosclerotic calcifications, although patent. IVC filter in the right common iliac vein, unchanged. No acute finding. No occlusion or significant stenosis. CHEST: MEDIASTINUM: Mild coronary atherosclerosis of the LAD and right coronary artery. No mediastinal lymphadenopathy. The heart and pericardium demonstrate no acute abnormality. LUNGS AND PLEURA: Mild centrilobular emphysematous changes. Right posterior Bochdalek hernia. Mild linear scarring/atelectasis in the bilateral lower lobes. Mild scarring in the medial right middle lobe. No evidence of pleural effusion or pneumothorax. THORACIC BONES AND SOFT TISSUES: Degenerative changes of the  visualized thoracolumbar spine. No acute bone or soft tissue abnormality. ABDOMEN AND PELVIS: LIVER: The liver is unremarkable. GALLBLADDER AND BILE DUCTS: Status post cholecystectomy. No biliary ductal dilatation. SPLEEN: The spleen is unremarkable. PANCREAS: The pancreas is unremarkable. ADRENAL GLANDS: Stable 14 mm left adrenal adenoma, benign. Bilateral adrenal glands demonstrate no acute abnormality. KIDNEYS, URETERS AND BLADDER: Small hyperdense hemorrhagic renal cysts unchanged from recent CT, benign. No stones in the kidneys or ureters. No hydronephrosis. No perinephric or periureteral stranding. Urinary bladder is unremarkable. GI AND  BOWEL: Stomach and duodenal sweep demonstrate no acute abnormality. There is no bowel obstruction. No abnormal bowel wall thickening or distension. REPRODUCTIVE: Reproductive organs are unremarkable. PERITONEUM AND RETROPERITONEUM: No ascites or free air. LYMPH NODES: No lymphadenopathy. ABDOMINAL BONES AND SOFT TISSUES: Postsurgical changes related to left inguinal hernia repair. Status post PLIF at L3-L5. Degenerative changes of the visualized thoracolumbar spine. No acute abnormality of the bones. No acute soft tissue abnormality. IMPRESSION: 1. No evidence of thoracoabdominal aortic aneurysm or dissection. 2. No pulmonary embolism. 3. No acute findings in the chest, abdomen, or pelvis. Electronically signed by: Pinkie Pebbles MD 07/02/2024 07:07 PM EDT RP Workstation: [id number]   CT L-SPINE NO CHARGE Result Date: 07/02/2024 CLINICAL DATA:  Left flank pain radiating to the groin history of fall EXAM: CT Lumbar Spine without contrast TECHNIQUE: Technique: Multiplanar CT images of the lumbar spine were reconstructed from contemporary CT of the Abdomen and Pelvis. RADIATION DOSE REDUCTION: This exam was performed according to the departmental dose-optimization program which includes automated exposure control, adjustment of the mA and/or kV according to patient size and/or use of iterative reconstruction technique. CONTRAST:  No additional COMPARISON:  MRI 08/22/2023 FINDINGS: Segmentation: 5 lumbar type vertebrae. Alignment: Normal. Vertebrae: No acute fracture or focal pathologic process. Paraspinal and other soft tissues: No acute finding. Diverticular disease of the sigmoid colon. Aortic atherosclerosis. Disc levels: Posterior spinal fusion hardware L3 through L5 with interbody devices at L3-L4 and L4-L5. Intact appearing hardware. Disc space narrowing at L2-L3. Diffuse disc bulge with suspected mild canal stenosis. Moderate facet degenerative changes. Moderate facet degenerative changes at L5-S1. No  high-grade canal stenosis. IMPRESSION: 1. No CT evidence for acute osseous abnormality. 2. Posterior spinal fusion hardware L3 through L5 with interbody devices at L3-L4 and L4-L5. Intact appearing hardware. 3. Degenerative changes as above 4. Aortic atherosclerosis. Aortic Atherosclerosis (ICD10-I70.0). Electronically Signed   By: Luke Bun M.D.   On: 07/02/2024 16:55   CT Renal Stone Study Result Date: 07/02/2024 EXAM: CT ABDOMEN AND PELVIS WITHOUT CONTRAST 07/02/2024 03:49:24 PM TECHNIQUE: CT of the abdomen and pelvis was performed without the administration of intravenous contrast. Multiplanar reformatted images are provided for review. Automated exposure control, iterative reconstruction, and/or weight-based adjustment of the mA/kV was utilized to reduce the radiation dose to as low as reasonably achievable. COMPARISON: None available. CLINICAL HISTORY: Abdominal/flank pain, stone suspected. [place] LITTIE Agent, a 73 y.o. male was evaluated in triage. Pt complains of left flank pain and dysuria started yesterday. Radiates into left groin. Also fell into wall yesterday then had back pain following. No head injury, loc, nor complaints prior to injury. FINDINGS: LOWER CHEST: Small right fat containing Bochdalek hernia. LIVER: The liver is unremarkable. GALLBLADDER AND BILE DUCTS: Status post cholecystectomy. No biliary ductal dilatation. SPLEEN: No acute abnormality. PANCREAS: No acute abnormality. ADRENAL GLANDS: 1.4 cm right adrenal nodule with density 13 HU, stable size, compatible with adenoma. Stable left adrenal wall thickening without discrete nodule. KIDNEYS, URETERS AND BLADDER:  Hyperdense right renal cortical lesions, largest 13 mm in the medial upper right kidney on series 2, image 25, compatible with hemorrhagic cysts, for which no follow up imaging is recommended. No stones in the kidneys or ureters. No hydronephrosis. No perinephric or periureteral stranding. The urinary bladder is collapsed with  chronic mild diffuse bladder wall thickening. No bladder stones. GI AND BOWEL: Stomach demonstrates no acute abnormality. Mild left colonic diverticulosis without large bowel wall thickening or significant pericolonic fat stranding. Status post appendectomy. There is no bowel obstruction. PERITONEUM AND RETROPERITONEUM: No ascites. No free air. VASCULATURE: Aorta is normal in caliber. Moderate atherosclerotic plaque of the aorta and its branches. Stable chronic malpositioning of the IVC filter within the right common iliac vein. LYMPH NODES: No lymphadenopathy. REPRODUCTIVE ORGANS: No acute abnormality. BONES AND SOFT TISSUES: Posterior spinal fusion hardware L3-L5. Moderate thoracolumbar spondylosis. No focal soft tissue abnormality. IMPRESSION: 1. No acute abnormality identified in the abdomen or pelvis. No urolithiasis. No hydronephrosis. 2. Mild left colonic diverticulosis without acute diverticulitis. 3. Stable 1.4 cm right adrenal adenoma, for which no follow-up imaging is recommended. 4. Hyperdense right renal cortical lesions, largest 13 mm, compatible with hemorrhagic/proteinaceous cysts. No follow-up imaging is recommended. Electronically signed by: Selinda Blue MD 07/02/2024 04:42 PM EDT RP Workstation: [id number]    Pertinent labs & imaging results that were available during my care of the patient were reviewed by me and considered in my medical decision making (see MDM for details).  Medications Ordered in ED Medications  ondansetron  (ZOFRAN ) injection 4 mg (4 mg Intravenous Given 07/02/24 1657)  oxyCODONE -acetaminophen  (PERCOCET/ROXICET) 5-325 MG per tablet 1 tablet (1 tablet Oral Given 07/02/24 1657)  morphine  (PF) 4 MG/ML injection 4 mg (4 mg Intravenous Given 07/02/24 1657)  lactated ringers  bolus 1,000 mL (0 mLs Intravenous Stopped 07/02/24 1804)  iohexol  (OMNIPAQUE ) 350 MG/ML injection 100 mL (100 mLs Intravenous Contrast Given 07/02/24 1808)  oxyCODONE  (Oxy IR/ROXICODONE ) immediate  release tablet 5 mg (5 mg Oral Given 07/02/24 1923)                                                                                                                                     Procedures Procedures  (including critical care time)  Medical Decision Making / ED Course   This patient presents to the ED for concern of back pain, this involves an extensive number of treatment options, and is a complaint that carries with it a high risk of complications and morbidity.  The differential diagnosis includes chronic back pain, herniated disc, nephrolithiasis, pyelonephritis, cystitis.  MDM:  Neurologically, patient is at his baseline.  He continues to have his pain and numbness in his right leg.  These are not new changes for him, do not believe he requires any MRI at this time.  Low suspicion for infection or cord compression.  Patient's pain is more on the left lower quadrant.  Does endorse some  burning with urination.  Patient noted to be very hypertensive.  Do have some concern for aortic dissection.  Will provide him some analgesia, obtain CT angiography.  Patient had some imaging done at triage for renal stone and L-spine CT imaging.  Urinalysis ordered.  Reassessment 7:50 PM-patient's CT imaging for renal stone, lumbar spine and angiography of the aorta are negative.  Patient continues to have that persistent numbness in his right leg which is not new.  He is able to lift both legs, ambulate with his cane.  Given the patient's reassuring workup, he is appropriate for outpatient follow-up.  Will discharge patient with a steroid burst.  He follows up with a spine doctor on Thursday.   Additional history obtained:  -External records from outside source obtained and reviewed including: Chart review including previous notes, labs, imaging, consultation notes   Lab Tests: -I ordered, reviewed, and interpreted labs.   The pertinent results include:   Labs Reviewed  COMPREHENSIVE METABOLIC  PANEL WITH GFR - Abnormal; Notable for the following components:      Result Value   Glucose, Bld 107 (*)    All other components within normal limits  CBC WITH DIFFERENTIAL/PLATELET - Abnormal; Notable for the following components:   Hemoglobin 11.1 (*)    HCT 37.3 (*)    MCV 69.7 (*)    MCH 20.7 (*)    MCHC 29.8 (*)    RDW 17.3 (*)    All other components within normal limits  I-STAT CHEM 8, ED - Abnormal; Notable for the following components:   BUN 6 (*)    Glucose, Bld 104 (*)    Hemoglobin 12.9 (*)    HCT 38.0 (*)    All other components within normal limits  URINALYSIS, ROUTINE W REFLEX MICROSCOPIC  LIPASE, BLOOD     Imaging Studies ordered: I ordered imaging studies including CT lumbar spine, renal stone, CT angiography. I independently visualized and interpreted imaging. I agree with the radiologist interpretation   Medicines ordered and prescription drug management: Meds ordered this encounter  Medications   ondansetron  (ZOFRAN ) injection 4 mg   oxyCODONE -acetaminophen  (PERCOCET/ROXICET) 5-325 MG per tablet 1 tablet    Refill:  0   morphine  (PF) 4 MG/ML injection 4 mg   lactated ringers  bolus 1,000 mL   iohexol  (OMNIPAQUE ) 350 MG/ML injection 100 mL   oxyCODONE  (Oxy IR/ROXICODONE ) immediate release tablet 5 mg    Refill:  0   methylPREDNISolone  (MEDROL  DOSEPAK) 4 MG TBPK tablet    Sig: Take as instructed by dose packaging    Dispense:  1 each    Refill:  0    -I have reviewed the patients home medicines and have made adjustments as needed   Cardiac Monitoring: The patient was maintained on a cardiac monitor.  I personally viewed and interpreted the cardiac monitored which showed an underlying rhythm of: Normal sinus rhythm  Social Determinants of Health:  Factors impacting patients care include: Multiple medical comorbidities including chronic back pain   Reevaluation: After the interventions noted above, I reevaluated the patient and found that they  have :improved  Co morbidities that complicate the patient evaluation  Past Medical History:  Diagnosis Date   Anxiety and depression    Back pain    Diverticulitis    Diverticulosis    DVT (deep venous thrombosis) (HCC)    Gout    Hernia    History of blood clots    Hypertension    Lesion of right  native kidney 01/03/2023   Nausea and vomiting    Neuropathy, peripheral 05/15/2012   patient denies   Shingles       Dispostion: I considered admission for this patient, however with his reassuring workup he is appropriate for discharge.     Final Clinical Impression(s) / ED Diagnoses Final diagnoses:  Chronic left-sided low back pain with sciatica, sciatica laterality unspecified     @PCDICTATION @    Mannie Pac T, DO 07/02/24 1955

## 2024-07-02 NOTE — Discharge Instructions (Addendum)
 Your pictures that were done today were normal.  Your blood work overall was normal, you do not have an infection in your urine.  I have sent a prescription for some steroids.  You may take those as prescribed.  You may continue taking all of your other medications.  Please follow-up with your primary care doctor within 1 week.  Return to the emergency room if you develop worsening pain in your back, inability to walk, or trouble controlling your bowels or bladder.

## 2024-07-02 NOTE — ED Triage Notes (Signed)
 Patient c/o back pain x 1 day. Patient report tripping and falling forward but was able to catch himself. Patient report taking PRN medication with no relief. Patient  8/10 back pain. Hx back surgery a year ago.

## 2024-07-02 NOTE — ED Notes (Signed)
 Patient transported to CT

## 2024-07-06 ENCOUNTER — Other Ambulatory Visit: Payer: Self-pay

## 2024-07-07 ENCOUNTER — Other Ambulatory Visit: Payer: Self-pay

## 2024-07-08 ENCOUNTER — Other Ambulatory Visit: Payer: Self-pay | Admitting: Urology

## 2024-07-08 DIAGNOSIS — N138 Other obstructive and reflux uropathy: Secondary | ICD-10-CM

## 2024-07-12 ENCOUNTER — Encounter: Payer: Self-pay | Admitting: Radiology

## 2024-07-19 ENCOUNTER — Other Ambulatory Visit: Payer: Self-pay | Admitting: Urology

## 2024-07-19 DIAGNOSIS — N138 Other obstructive and reflux uropathy: Secondary | ICD-10-CM

## 2024-07-20 ENCOUNTER — Other Ambulatory Visit: Payer: Self-pay | Admitting: Family Medicine

## 2024-07-20 DIAGNOSIS — J439 Emphysema, unspecified: Secondary | ICD-10-CM

## 2024-07-28 ENCOUNTER — Other Ambulatory Visit: Payer: Self-pay

## 2024-07-28 ENCOUNTER — Other Ambulatory Visit: Payer: Self-pay | Admitting: Family Medicine

## 2024-07-28 DIAGNOSIS — I10 Essential (primary) hypertension: Secondary | ICD-10-CM

## 2024-07-28 MED ORDER — METOPROLOL SUCCINATE ER 25 MG PO TB24
25.0000 mg | ORAL_TABLET | Freq: Every day | ORAL | 1 refills | Status: AC
  Filled 2024-07-28: qty 30, 30d supply, fill #0
  Filled 2024-09-13: qty 30, 30d supply, fill #1
  Filled 2024-10-12: qty 30, 30d supply, fill #2

## 2024-07-29 ENCOUNTER — Ambulatory Visit: Payer: Self-pay

## 2024-07-29 ENCOUNTER — Emergency Department (HOSPITAL_COMMUNITY)
Admission: EM | Admit: 2024-07-29 | Discharge: 2024-07-29 | Discharge status: Against Medical Advice | Source: Ambulatory Visit | Attending: Emergency Medicine | Admitting: Emergency Medicine

## 2024-07-29 ENCOUNTER — Encounter (HOSPITAL_COMMUNITY): Payer: Self-pay

## 2024-07-29 ENCOUNTER — Other Ambulatory Visit: Payer: Self-pay

## 2024-07-29 ENCOUNTER — Ambulatory Visit: Admitting: Family Medicine

## 2024-07-29 ENCOUNTER — Emergency Department (HOSPITAL_COMMUNITY)

## 2024-07-29 DIAGNOSIS — R059 Cough, unspecified: Secondary | ICD-10-CM | POA: Insufficient documentation

## 2024-07-29 DIAGNOSIS — R110 Nausea: Secondary | ICD-10-CM | POA: Diagnosis not present

## 2024-07-29 DIAGNOSIS — Z5329 Procedure and treatment not carried out because of patient's decision for other reasons: Secondary | ICD-10-CM | POA: Insufficient documentation

## 2024-07-29 DIAGNOSIS — R079 Chest pain, unspecified: Secondary | ICD-10-CM

## 2024-07-29 DIAGNOSIS — R0602 Shortness of breath: Secondary | ICD-10-CM | POA: Diagnosis present

## 2024-07-29 DIAGNOSIS — R300 Dysuria: Secondary | ICD-10-CM | POA: Diagnosis not present

## 2024-07-29 DIAGNOSIS — I82409 Acute embolism and thrombosis of unspecified deep veins of unspecified lower extremity: Secondary | ICD-10-CM | POA: Diagnosis not present

## 2024-07-29 DIAGNOSIS — D649 Anemia, unspecified: Secondary | ICD-10-CM | POA: Diagnosis not present

## 2024-07-29 DIAGNOSIS — M546 Pain in thoracic spine: Secondary | ICD-10-CM | POA: Diagnosis not present

## 2024-07-29 DIAGNOSIS — I2699 Other pulmonary embolism without acute cor pulmonale: Secondary | ICD-10-CM | POA: Insufficient documentation

## 2024-07-29 DIAGNOSIS — Z7901 Long term (current) use of anticoagulants: Secondary | ICD-10-CM | POA: Insufficient documentation

## 2024-07-29 DIAGNOSIS — R350 Frequency of micturition: Secondary | ICD-10-CM | POA: Insufficient documentation

## 2024-07-29 DIAGNOSIS — R1010 Upper abdominal pain, unspecified: Secondary | ICD-10-CM | POA: Insufficient documentation

## 2024-07-29 DIAGNOSIS — J449 Chronic obstructive pulmonary disease, unspecified: Secondary | ICD-10-CM | POA: Insufficient documentation

## 2024-07-29 DIAGNOSIS — R7989 Other specified abnormal findings of blood chemistry: Secondary | ICD-10-CM | POA: Insufficient documentation

## 2024-07-29 DIAGNOSIS — Z79899 Other long term (current) drug therapy: Secondary | ICD-10-CM | POA: Insufficient documentation

## 2024-07-29 DIAGNOSIS — R051 Acute cough: Secondary | ICD-10-CM

## 2024-07-29 HISTORY — DX: Other pulmonary embolism without acute cor pulmonale: I26.99

## 2024-07-29 LAB — HEPATIC FUNCTION PANEL
ALT: 23 U/L (ref 0–44)
AST: 22 U/L (ref 15–41)
Albumin: 3.8 g/dL (ref 3.5–5.0)
Alkaline Phosphatase: 55 U/L (ref 38–126)
Bilirubin, Direct: 0.1 mg/dL (ref 0.0–0.2)
Indirect Bilirubin: 0.2 mg/dL — ABNORMAL LOW (ref 0.3–0.9)
Total Bilirubin: 0.3 mg/dL (ref 0.0–1.2)
Total Protein: 6.7 g/dL (ref 6.5–8.1)

## 2024-07-29 LAB — CBC
HCT: 31.7 % — ABNORMAL LOW (ref 39.0–52.0)
Hemoglobin: 9.7 g/dL — ABNORMAL LOW (ref 13.0–17.0)
MCH: 20.8 pg — ABNORMAL LOW (ref 26.0–34.0)
MCHC: 30.6 g/dL (ref 30.0–36.0)
MCV: 68 fL — ABNORMAL LOW (ref 80.0–100.0)
Platelets: 285 K/uL (ref 150–400)
RBC: 4.66 MIL/uL (ref 4.22–5.81)
RDW: 18.5 % — ABNORMAL HIGH (ref 11.5–15.5)
WBC: 6.6 K/uL (ref 4.0–10.5)
nRBC: 0 % (ref 0.0–0.2)

## 2024-07-29 LAB — BASIC METABOLIC PANEL WITH GFR
Anion gap: 9 (ref 5–15)
BUN: 9 mg/dL (ref 8–23)
CO2: 29 mmol/L (ref 22–32)
Calcium: 9.3 mg/dL (ref 8.9–10.3)
Chloride: 104 mmol/L (ref 98–111)
Creatinine, Ser: 0.74 mg/dL (ref 0.61–1.24)
GFR, Estimated: >60 mL/min (ref >60)
Glucose, Bld: 85 mg/dL (ref 70–99)
Potassium: 3.8 mmol/L (ref 3.5–5.1)
Sodium: 142 mmol/L (ref 135–145)

## 2024-07-29 LAB — TROPONIN T, HIGH SENSITIVITY: Troponin T High Sensitivity: 31 ng/L — ABNORMAL HIGH (ref 0–19)

## 2024-07-29 LAB — LIPASE, BLOOD: Lipase: 17 U/L (ref 11–51)

## 2024-07-29 MED ORDER — ACETAMINOPHEN 325 MG PO TABS
650.0000 mg | ORAL_TABLET | Freq: Once | ORAL | Status: DC
  Filled 2024-07-29: qty 2

## 2024-07-29 NOTE — ED Notes (Addendum)
 Patient states he was so tired on and just want to go home. RN ask if he wants to wait for PA but patient refused. P.A. Informed.

## 2024-07-29 NOTE — Telephone Encounter (Signed)
 Noted- ok to close.

## 2024-07-29 NOTE — ED Triage Notes (Signed)
 Patient said he has a bad cough and said that his lungs hurt. This has been going on for 2 weeks. Is coughing up yellow mucus. Has centralized chest pain. No radiation. Feels short of breath.

## 2024-07-29 NOTE — Telephone Encounter (Signed)
 FYI Only or Action Required?: FYI only for provider: ED advised.  Patient was last seen in primary care on 05/13/2024 by [name] Heron HERO, MD.  Called Nurse Triage reporting Cough.  Symptoms began several days ago.  Interventions attempted: Nothing.  Symptoms are: unchanged.  Triage Disposition: Go to ED Now (Notify PCP)  Patient/caregiver understands and will follow disposition?: Yes  Copied from CRM [id number]. Topic: Clinical - Red Word Triage >> Jul 29, 2024 11:32 AM Nessti S wrote: [facility] that prompted transfer to Nurse Triage: coughing up dark green mucous with chest and side pain and headache Reason for Disposition  Chest pain  (Exception: MILD central chest pain, present only when coughing.)  Answer Assessment - Initial Assessment Questions Advised ED now.  Advised 911 if symptoms worsen.  1. ONSET: When did the cough begin?      2 weeks; coughing makes chest and back pain worse, chest upper back pain/tightness started yesterday Coughing makes HA worse 2. SEVERITY: How bad is the cough today?      Deep breathing chest pain hurts worse and back 3. SPUTUM: Describe the color of your sputum (e.g., none, dry cough; clear, white, yellow, green)     Yellowish greeen to gray 4. HEMOPTYSIS: Are you coughing up any blood? If Yes, ask: How much? (e.g., flecks, streaks, tablespoons, etc.)     No, only when when blowing nose 5. DIFFICULTY BREATHING: Are you having difficulty breathing? If Yes, ask: How bad is it? (e.g., mild, moderate, severe)      Wheezing, mild breathing 6. FEVER: Do you have a fever? If Yes, ask: What is your temperature, how was it measured, and when did it start?     Denies vomiting Fever; 100 taken few days ago, chills, nausea 7. CARDIAC HISTORY: Do you have any history of heart disease? (e.g., heart attack, congestive heart failure)      no 8. LUNG HISTORY: Do you have any history of lung disease?  (e.g., pulmonary embolus, asthma,  emphysema)     copd 9. PE RISK FACTORS: Do you have a history of blood clots? (or: recent major surgery, recent prolonged travel, bedridden)   History blood clots  10. OTHER SYMPTOMS: Do you have any other symptoms? (e.g., runny nose, wheezing, chest pain)       Wheezing, chest pain, sob, dizziness intermittently 12. TRAVEL: Have you traveled out of the country in the last month? (e.g., travel history, exposures)       no  Protocols used: Cough - Acute Productive-A-AH

## 2024-07-29 NOTE — ED Provider Notes (Signed)
 [place] EMERGENCY DEPARTMENT AT [facility] Provider Note   CSN: [id number] Arrival date & time: 07/29/24  1315     Patient presents with: Cough   [name] [name] is a 73 y.o. male.   Patient is here for evaluation of shortness of breath, headaches, and productive cough with sputum production.  Past medical history includes DVTs and pulmonary embolism.  Patient takes Xarelto  and denies any missed doses of this medication.  He reports today symptoms do not feel like how he felt with previous pulmonary embolism.    Patient reports cough initially started a month ago it subsided and then returned 2 weeks ago.  He reports 2 days of fever last week on Friday and Saturday.  He reports dark urine, foul-smelling urine, dysuria, and decreased urinary frequency for the past week.  He reports pain below bilateral ribs that worsens with cough.  He denies chest pain, dizziness, syncope.  He reports shortness of breath at rest that is worse with exertion and coughing.  Patient denies any recent vomiting though, he did have some nausea that responded well to previously prescribed Zofran .  He reports pain across the upper abdomen/lower thoracic region that is worse with cough.  He also reports back pain that is worse on the right.  He has not tried any over-the-counter pain medications.  Patient takes daily oxycodone  which he states has not helped with pain or headaches.  Last bowel movement earlier today that was small and hard in nature.  He reports he has felt constipated over the past month using Colace with minimal improvement.  He denies change in color of stool.  He has not been evaluated for the symptoms over the past month as he assumed it was a cold that would resolve however, he is feeling overall poorly with headache today and decided to seek further evaluation.  The history is provided by the patient.  Cough      Prior to Admission medications   Medication Sig Start Date End Date  Taking? Authorizing Provider  albuterol  (PROVENTIL  HFA;VENTOLIN  HFA) 108 (90 Base) MCG/ACT inhaler Inhale 2 puffs into the lungs every 6 (six) hours as needed for wheezing or shortness of breath. 01/27/17   Charlyn Sora, MD  albuterol  (PROVENTIL ) (2.5 MG/3ML) 0.083% nebulizer solution Take 2.5 mg by nebulization every 6 (six) hours as needed for wheezing or shortness of breath. 02/04/23   [provider]  alfuzosin  (UROXATRAL ) 10 MG 24 hr tablet TAKE 1 TABLET(10 MG) BY MOUTH DAILY WITH BREAKFAST 07/08/24   Stoneking, Adine PARAS., MD  ARIPiprazole  (ABILIFY ) 10 MG tablet TAKE 1 TABLET(10 MG) BY MOUTH DAILY 02/26/24   Ozell Heron HERO, MD  ARIPiprazole  (ABILIFY ) 10 MG tablet Take 1 tablet (10 mg total) by mouth daily. 02/26/24   Ozell Heron HERO, MD  atorvastatin  (LIPITOR) 20 MG tablet Take 1 tablet (20 mg total) by mouth in the morning 12/09/23   Ozell Heron HERO, MD  BREO ELLIPTA  100-25 MCG/ACT AEPB Inhale 1 puff into the lungs daily. 12/03/23   [provider]  budesonide-glycopyrrolate -formoterol (BREZTRI  AEROSPHERE) 160-9-4.8 MCG/ACT AERO inhaler INHALE 2 PUFFS INTO THE LUNGS TWICE DAILY 07/20/24   Ozell Heron HERO, MD  cyclobenzaprine  (FLEXERIL ) 10 MG tablet Take 1 tablet (10 mg total) by mouth 3 (three) times daily as needed for muscle spasms. 10/16/23   Ozell Heron HERO, MD  DULoxetine  (CYMBALTA ) 20 MG capsule Take 1 capsule (20 mg total) by mouth every morning. 12/09/23   Ozell Heron HERO,  MD  DULoxetine  (CYMBALTA ) 60 MG capsule TAKE 1 CAPSULE(60 MG) BY MOUTH DAILY 02/03/24   Ozell Heron HERO, MD  GELSYN-3 16.8 MG/2ML SOSY intra-articular injection  11/12/23   [provider]  hydrochlorothiazide  (HYDRODIURIL ) 25 MG tablet TAKE 1 TABLET(25 MG) BY MOUTH DAILY 01/10/24   Ozell Heron HERO, MD  methylPREDNISolone  (MEDROL  DOSEPAK) 4 MG TBPK tablet Take as instructed by dose packaging 07/02/24   Mannie Pac T, DO  metolazone  (ZAROXOLYN ) 5 MG tablet Take 1 tablet (5 mg  total) by mouth daily as needed. For swelling only 01/06/24   Ozell Heron HERO, MD  metoprolol  succinate (TOPROL -XL) 25 MG 24 hr tablet Take 1 tablet (25 mg total) by mouth daily. 07/28/24   Ozell Heron HERO, MD  nicotine  (NICODERM CQ  - DOSED IN MG/24 HOURS) 14 mg/24hr patch Place 1 patch (14 mg total) onto the skin daily. 08/14/23   Ozell Heron HERO, MD  nicotine  (NICODERM CQ  - DOSED IN MG/24 HOURS) 21 mg/24hr patch Place 1 patch (21 mg total) onto the skin daily. 08/14/23   Ozell Heron HERO, MD  nicotine  (NICODERM CQ  - DOSED IN MG/24 HR) 7 mg/24hr patch Place 1 patch (7 mg total) onto the skin daily. 08/14/23   Ozell Heron HERO, MD  NIFEdipine  (PROCARDIA  XL/NIFEDICAL XL) 60 MG 24 hr tablet Take 2 tablets (120 mg total) by mouth at bedtime. 12/09/23   Ozell Heron HERO, MD  olmesartan  (BENICAR ) 40 MG tablet Take 1 tablet (40 mg total) by mouth at bedtime. 12/09/23   Ozell Heron HERO, MD  ondansetron  (ZOFRAN ) 4 MG tablet Take 1 tablet (4 mg total) by mouth every 8 (eight) hours as needed for nausea or vomiting. 03/09/24   Ozell Heron HERO, MD  Oxycodone  HCl 10 MG TABS Take 10 mg by mouth in the morning, at noon, in the evening, and at bedtime. 08/12/23   [provider]  pantoprazole  (PROTONIX ) 40 MG tablet Take 1 tablet (40 mg total) by mouth every morning 06/07/24   Ozell Heron HERO, MD  potassium chloride  SA (KLOR-CON  M) 20 MEQ tablet Take 1 tablet (20 mEq total) by mouth daily. 02/11/24   Ozell Heron HERO, MD  rivaroxaban  (XARELTO ) 20 MG TABS tablet Take 1 tablet (20 mg total) by mouth at bedtime. 12/09/23   Ozell Heron HERO, MD  spironolactone  (ALDACTONE ) 25 MG tablet Take 0.5 tablets (12.5 mg total) by mouth every morning. 12/09/23   Ozell Heron HERO, MD    Allergies: Nsaids, Aspirin, Celecoxib, Ibuprofen, Pregabalin, and Vicodin [hydrocodone -acetaminophen ]    Review of Systems  Respiratory:  Positive for cough.     Updated Vital Signs BP (!) 165/87 (BP Location: Left Arm)    Pulse 94   Temp 98.6 F (37 C) (Oral)   Resp (!) 22   Ht 6' 4 (1.93 m)   Wt 99.8 kg   SpO2 99%   BMI 26.78 kg/m   Physical Exam Vitals and nursing note reviewed.  Constitutional:      General: He is not in acute distress.    Appearance: Normal appearance. He is not diaphoretic.  HENT:     Head: Normocephalic and atraumatic.     Nose: No rhinorrhea.     Mouth/Throat:     Mouth: Mucous membranes are moist.  Eyes:     General: No scleral icterus.    Extraocular Movements: Extraocular movements intact.     Conjunctiva/sclera: Conjunctivae normal.  Cardiovascular:     Rate and Rhythm: Normal rate and regular rhythm.  Pulses: Normal pulses.     Heart sounds: Normal heart sounds.  Pulmonary:     Effort: Pulmonary effort is normal. No respiratory distress.     Breath sounds: No stridor. Rhonchi present. No wheezing or rales.     Comments: Auscultation limited as with each deep inhalation the patient would begin having productive cough.  Coarse rhonchi throughout that seemed to clear with cough.  No obvious wheezing or rales heard. Chest:     Chest wall: Tenderness present.  Abdominal:     General: Abdomen is flat. Bowel sounds are normal. There is no distension.     Palpations: Abdomen is soft.     Tenderness: There is abdominal tenderness. There is right CVA tenderness. There is no left CVA tenderness or guarding.  Skin:    General: Skin is warm and dry.     Capillary Refill: Capillary refill takes less than 2 seconds.     Coloration: Skin is not jaundiced or pale.  Neurological:     Mental Status: He is alert and oriented to person, place, and time.     (all labs ordered are listed, but only abnormal results are displayed) Labs Reviewed  BASIC METABOLIC PANEL WITH GFR  CBC  TROPONIN T, HIGH SENSITIVITY    EKG: EKG Interpretation Date/Time:  Thursday July 29 2024 13:32:02 EST Ventricular Rate:  94 PR Interval:  131 QRS Duration:  105 QT  Interval:  343 QTC Calculation: 429 R Axis:   -71  Text Interpretation: Sinus rhythm Ventricular premature complex LAD, consider left anterior fascicular block Low voltage, extremity leads Abnormal R-wave progression, late transition Borderline ST elevation, anterior leads Confirmed by Rogelia Satterfield ([id number]) on 07/29/2024 2:24:32 PM  Radiology: DG Chest 2 View Result Date: 07/29/2024 CLINICAL DATA:  Shortness of breath. EXAM: CHEST - 2 VIEW COMPARISON:  Chest radiograph dated 03/09/2024. FINDINGS: No focal consolidation, pleural effusion or pneumothorax. The cardiac silhouette is within normal limits. No acute osseous pathology. IMPRESSION: No active cardiopulmonary disease. Electronically Signed   By: Vanetta Chou M.D.   On: 07/29/2024 14:15   Procedures   Medications Ordered in the ED - No data to display    Patient presents to the ED for concern of shortness of breath and productive cough, this involves an extensive number of treatment options, and is a complaint that carries with it a high risk of complications and morbidity.  The differential diagnosis includes pulmonary embolism, malignancy, pneumothorax, pneumonia, viral upper respiratory infection, sepsis, MI, UTI, pyelonephritis.   Co morbidities that complicate the patient evaluation  History of DVTs and pulmonary embolism.  Taking Xarelto . Current use of anticoagulants BPH, chronic prostatitis COPD with history of exacerbation and acute hypoxic respiratory failure Prescription opioid use   Additional history obtained:  Additional history obtained from  Outside Medical Records   External records from outside source obtained and reviewed including previous imaging studies   Lab Tests:  I Ordered, and personally interpreted labs.  The pertinent results include: Elevated troponin at 31.  Low hemoglobin of 9.7 in comparison to previous hemoglobin of 12.9 on October 24.   Imaging Studies ordered:  I ordered  imaging studies including chest x-ray I independently visualized and interpreted imaging which showed no acute abnormalities or evidence of pneumonia I agree with the radiologist interpretation   Cardiac Monitoring:  The patient was maintained on a cardiac monitor.  I personally viewed and interpreted the cardiac monitored which showed an underlying rhythm of: Sinus rhythm with occasional PVC and  borderline ST elevation though no obvious STEMI.   Medicines ordered and prescription drug management:  I ordered medication including Tylenol  for pain management Patient left AMA before this could be given.   Critical Interventions:  During patient workup, the patient left AMA.  Patient was contacted by phone and advised of elevated troponin, the potential for heart attack, and our recommendation to return to the ED for further evaluation.  Patient stated that he would return.   Problem List / ED Course:  Patient is here for evaluation of shortness of breath, headaches, and productive cough.  Labs and imaging ordered.  Patient left AMA before completion of workup.  Concerning findings include elevated troponin of 31 and decreased hemoglobin of 9.7 with most recent hemoglobin of 12.9 on October 24.  Concerning physical exam findings and history include dark urine, foul-smelling urine, decreased urination, and right CVA tenderness.  Patient was contacted by phone and stated that he would return to complete workup.   Reevaluation:  Patient left AMA prior to reevaluation.   Dispostion:  Patient left AMA.  He was soon contacted by phone at 1537 and stated that he would return to complete evaluation.   Medical Decision Making Amount and/or Complexity of Data Reviewed Labs: ordered. Radiology: ordered.  Risk OTC drugs.   Final diagnoses:  None    ED Discharge Orders     None          Rosina Almarie DELENA DEVONNA 07/29/24 1649    Rogelia Jerilynn RAMAN, MD 07/30/24 [phone]

## 2024-08-02 ENCOUNTER — Other Ambulatory Visit: Payer: Self-pay

## 2024-08-11 ENCOUNTER — Other Ambulatory Visit: Payer: Self-pay | Admitting: Family Medicine

## 2024-08-11 DIAGNOSIS — F331 Major depressive disorder, recurrent, moderate: Secondary | ICD-10-CM

## 2024-08-12 ENCOUNTER — Ambulatory Visit: Payer: Self-pay

## 2024-08-12 NOTE — Telephone Encounter (Signed)
  FYI Only or Action Required?: Action required by provider: request for appointment.  Patient was last seen in primary care on 05/13/2024 by Ozell Heron HERO, MD.  Called Nurse Triage reporting Leg Swelling.  Symptoms began yesterday.  Interventions attempted: Nothing.  Symptoms are: unchanged.Swelling to both legs, right is worse. Painful. History of DVT, on Eliquis. Will go to ED for worsening of symptoms.  Triage Disposition: See Physician Within 24 Hours  Patient/caregiver understands and will follow disposition?: Yes      Copied from CRM #[id number]. Topic: Clinical - Red Word Triage >> Aug 12, 2024 12:35 PM [name] [name] wrote: Red Word that prompted transfer to Nurse Triage: swelling in legs, retaining fluid Reason for Disposition  [1] MODERATE leg swelling (e.g., swelling extends up to knees) AND [2] new-onset or getting worse  Answer Assessment - Initial Assessment Questions 1. ONSET: When did the swelling start? (e.g., minutes, hours, days)     yesterday 2. LOCATION: What part of the leg is swollen?  Are both legs swollen or just one leg?     Right is worse up to thigh 3. SEVERITY: How bad is the swelling? (e.g., localized; mild, moderate, severe)     moderate 4. REDNESS: Is there redness or signs of infection?     no 5. PAIN: Is the swelling painful to touch? If Yes, ask: How painful is it?   (Scale 1-10; mild, moderate or severe)     severe 6. FEVER: Do you have a fever? If Yes, ask: What is it, how was it measured, and when did it start?      no 7. CAUSE: What do you think is causing the leg swelling?     unsure 8. MEDICAL HISTORY: Do you have a history of blood clots (e.g., DVT), cancer, heart failure, kidney disease, or liver failure?     DVT 9. RECURRENT SYMPTOM: Have you had leg swelling before? If Yes, ask: When was the last time? What happened that time?     Yes 10. OTHER SYMPTOMS: Do you have any other symptoms? (e.g., chest pain,  difficulty breathing)       no 11. PREGNANCY: Is there any chance you are pregnant? When was your last menstrual period?       N/a  Protocols used: Leg Swelling and Edema-A-AH

## 2024-08-12 NOTE — Telephone Encounter (Signed)
 Noted- ok to close.

## 2024-08-13 ENCOUNTER — Ambulatory Visit: Admitting: Family Medicine

## 2024-08-30 ENCOUNTER — Ambulatory Visit: Payer: Self-pay | Admitting: *Deleted

## 2024-08-30 ENCOUNTER — Ambulatory Visit: Admitting: Family Medicine

## 2024-08-30 ENCOUNTER — Encounter: Payer: Self-pay | Admitting: Family Medicine

## 2024-08-30 ENCOUNTER — Ambulatory Visit

## 2024-08-30 VITALS — BP 158/90 | HR 92 | Temp 98.7°F | Wt 221.0 lb

## 2024-08-30 DIAGNOSIS — J209 Acute bronchitis, unspecified: Secondary | ICD-10-CM

## 2024-08-30 DIAGNOSIS — R059 Cough, unspecified: Secondary | ICD-10-CM

## 2024-08-30 DIAGNOSIS — J440 Chronic obstructive pulmonary disease with acute lower respiratory infection: Secondary | ICD-10-CM

## 2024-08-30 LAB — POC COVID19 BINAXNOW: SARS Coronavirus 2 Ag: NEGATIVE

## 2024-08-30 LAB — POCT INFLUENZA A/B
Influenza A, POC: NEGATIVE
Influenza B, POC: NEGATIVE

## 2024-08-30 MED ORDER — HYDROCODONE BIT-HOMATROP MBR 5-1.5 MG/5ML PO SOLN: 5.0000 mL | ORAL | 0 refills | Status: DC | PRN

## 2024-08-30 MED ORDER — ALBUTEROL SULFATE HFA 108 (90 BASE) MCG/ACT IN AERS: 2.0000 | INHALATION_SPRAY | RESPIRATORY_TRACT | 0 refills | Status: DC | PRN

## 2024-08-30 MED ORDER — METHYLPREDNISOLONE 4 MG PO TBPK: ORAL_TABLET | ORAL | 0 refills | Status: DC

## 2024-08-30 NOTE — Addendum Note (Signed)
 Addended by: LADONNA INOCENTE SAILOR on: 08/30/2024 04:20 PM   Modules accepted: Orders

## 2024-08-30 NOTE — Telephone Encounter (Signed)
 Appt today with Dr Johnny at 2:15pm.

## 2024-08-30 NOTE — Progress Notes (Signed)
" ° °  Subjective:    Patient ID: [name] [name], male    DOB: [birth year], 73 y.o.   MRN: [id number]  HPI Here for 5 weeks of chest congestion, SOB, and coughing up yellow sputum. He thinks he has had a fever off and on. He was in the ED on 07-29-24, and a CXR that day was clear. He was not given an antibiotic at that time. He also has COPD, and he uses Breztri  daily. He uses Albuterol  as well with both an inhaler and a nebulizer.    Review of Systems  Constitutional: Negative.   HENT: Negative.    Respiratory:  Positive for cough, chest tightness, shortness of breath and wheezing.   Cardiovascular: Negative.        Objective:   Physical Exam Constitutional:      Appearance: Normal appearance. He is not ill-appearing.  HENT:     Right Ear: Tympanic membrane, ear canal and external ear normal.     Left Ear: Tympanic membrane, ear canal and external ear normal.     Nose: Nose normal.     Mouth/Throat:     Pharynx: Oropharynx is clear.  Eyes:     Conjunctiva/sclera: Conjunctivae normal.  Cardiovascular:     Rate and Rhythm: Normal rate and regular rhythm.     Pulses: Normal pulses.     Heart sounds: Normal heart sounds.  Pulmonary:     Effort: Pulmonary effort is normal.     Breath sounds: Wheezing and rhonchi present. No rales.  Lymphadenopathy:     Cervical: No cervical adenopathy.  Neurological:     Mental Status: He is alert.           Assessment & Plan:  Bronchitis on top of COPD. We will get a CXR today. Treat with 10 days of Augmentin  and a Medrol  dose pack. Use Hycodan as needed for the cough. I personally spent a total of 35 minutes in the care of the patient today including getting/reviewing separately obtained history, performing a medically appropriate exam/evaluation, and placing orders.  Garnette Olmsted, MD   "

## 2024-08-30 NOTE — Telephone Encounter (Signed)
 Noted- ok to close.

## 2024-08-30 NOTE — Telephone Encounter (Signed)
 FYI Only or Action Required?: FYI only for provider: appointment scheduled on 12/22.  Patient was last seen in primary care on 05/13/2024 by Ozell Heron HERO, MD.  Called Nurse Triage reporting Cough.  Symptoms began several days ago.  Interventions attempted: Rest, hydration, or home remedies.  Symptoms are: gradually worsening.  Triage Disposition: See HCP Within 4 Hours (Or PCP Triage)  Patient/caregiver understands and will follow disposition?: Yes  Copied from CRM #[id number]. Topic: Clinical - Red Word Triage >> Aug 30, 2024 10:30 AM Rea BROCKS wrote: Red Word that prompted transfer to Nurse Triage: coughing, phlegm, no fever, some shortness of breath- not major shortness, just coughing a whole lot. Reason for Disposition  [1] MILD difficulty breathing (e.g., minimal/no SOB at rest, SOB with walking, pulse < 100) AND [2] still present when not coughing  Answer Assessment - Initial Assessment Questions 1. ONSET: When did the cough begin?      Started 3 days ago 2. SEVERITY: How bad is the cough today?      Constant cough at night 3. SPUTUM: Describe the color of your sputum (e.g., none, dry cough; clear, white, yellow, green)     green 4. HEMOPTYSIS: Are you coughing up any blood? If Yes, ask: How much? (e.g., flecks, streaks, tablespoons, etc.)     No blood 5. DIFFICULTY BREATHING: Are you having difficulty breathing? If Yes, ask: How bad is it? (e.g., mild, moderate, severe)      Mild- with exertion  6. FEVER: Do you have a fever? If Yes, ask: What is your temperature, how was it measured, and when did it start?     no 7. CARDIAC HISTORY: Do you have any history of heart disease? (e.g., heart attack, congestive heart failure)      Hx hypertension 8. LUNG HISTORY: Do you have any history of lung disease?  (e.g., pulmonary embolus, asthma, emphysema)     COPD, pulmonary embolism  9. PE RISK FACTORS: Do you have a history of blood clots? (or: recent major  surgery, recent prolonged travel, bedridden)     no 10. OTHER SYMPTOMS: Do you have any other symptoms? (e.g., runny nose, wheezing, chest pain)       Some sinus congestion  Protocols used: Cough - Acute Productive-A-AH

## 2024-09-13 ENCOUNTER — Other Ambulatory Visit (HOSPITAL_COMMUNITY): Payer: Self-pay

## 2024-09-13 ENCOUNTER — Other Ambulatory Visit: Payer: Self-pay

## 2024-09-14 ENCOUNTER — Ambulatory Visit: Payer: Self-pay | Admitting: Pharmacist

## 2024-09-14 ENCOUNTER — Other Ambulatory Visit: Payer: Self-pay

## 2024-09-15 ENCOUNTER — Other Ambulatory Visit: Payer: Self-pay

## 2024-09-15 ENCOUNTER — Other Ambulatory Visit (HOSPITAL_COMMUNITY): Payer: Self-pay

## 2024-09-16 ENCOUNTER — Ambulatory Visit: Payer: Self-pay | Admitting: Family Medicine

## 2024-09-17 ENCOUNTER — Encounter: Payer: Self-pay | Admitting: Family Medicine

## 2024-09-17 ENCOUNTER — Ambulatory Visit: Admitting: Family Medicine

## 2024-09-17 ENCOUNTER — Other Ambulatory Visit: Payer: Self-pay | Admitting: Family Medicine

## 2024-09-17 VITALS — BP 140/62 | HR 85 | Temp 99.2°F | Ht 76.0 in | Wt 228.1 lb

## 2024-09-17 DIAGNOSIS — G4733 Obstructive sleep apnea (adult) (pediatric): Secondary | ICD-10-CM | POA: Diagnosis not present

## 2024-09-17 DIAGNOSIS — J441 Chronic obstructive pulmonary disease with (acute) exacerbation: Secondary | ICD-10-CM

## 2024-09-17 DIAGNOSIS — J9601 Acute respiratory failure with hypoxia: Secondary | ICD-10-CM

## 2024-09-17 MED ORDER — METHYLPREDNISOLONE ACETATE 40 MG/ML IJ SUSP
40.0000 mg | Freq: Once | INTRAMUSCULAR | Status: AC
  Administered 2024-09-17: 40 mg via INTRAMUSCULAR

## 2024-09-17 MED ORDER — METHYLPREDNISOLONE ACETATE 80 MG/ML IJ SUSP: 80.0000 mg | Freq: Once | INTRAMUSCULAR | Status: AC

## 2024-09-17 MED ORDER — PREDNISONE 20 MG PO TABS: ORAL_TABLET | ORAL | 0 refills | Status: AC

## 2024-09-17 MED ORDER — IPRATROPIUM-ALBUTEROL 0.5-2.5 (3) MG/3ML IN SOLN
3.0000 mL | Freq: Once | RESPIRATORY_TRACT | Status: AC
  Administered 2024-09-17: 3 mL via RESPIRATORY_TRACT

## 2024-09-17 MED ORDER — IPRATROPIUM-ALBUTEROL 0.5-2.5 (3) MG/3ML IN SOLN: 3.0000 mL | Freq: Four times a day (QID) | RESPIRATORY_TRACT | 0 refills | Status: DC | PRN

## 2024-09-17 NOTE — Progress Notes (Unsigned)
 "  Established Patient Office Visit  Subjective   Patient ID: [name] [name], male    DOB: 1951/07/20  Age: 74 y.o. MRN: [id number]  Chief Complaint  Patient presents with   Flank Pain    Patient complains of right flank pain x2-3 months , denies pain with urination or other symptoms   Generalized Body Aches    Recurrent, states he takes pain medications also   Cough    Productive with gray-brown-yellow sputum x4-5 months ago    Flank Pain Associated symptoms include a fever.  Cough Associated symptoms include a fever, shortness of breath and wheezing. Pertinent negatives include no chills.   Discussed the use of AI scribe software for clinical note transcription with the patient, who gave verbal consent to proceed.  History of Present Illness   [name] [name] is a 74 year old male with chronic bronchitis and COPD who presents with persistent respiratory symptoms.  He reports ongoing cough, congestion, shortness of breath, and yellow sputum since an ER visit on November 20, when chest x-ray showed no pneumonia. He was seen again on December 22 and prescribed Augmentin  and a steroid dose pack, but he delayed starting Augmentin  until recently due to concern about mixing medications.  His respiratory symptoms and fatigue have not improved. He uses Breztri  two puffs twice daily and albuterol  as needed. He has not started CPAP because the machine has not yet been received.  He describes two episodes this week of night sweats with a low-grade fever. He also notes flank pain without dysuria or hematuria.  He has anemia with low hemoglobin on prior labs, which contributes to his shortness of breath. Flu testing in December was negative. Troponin was mildly elevated at a November ER visit, and he has not yet seen a cardiologist.  He takes Cymbalta  and Xarelto  and understands the increased bleeding risk with this combination. He denies bleeding symptoms.  He has not recently spoken with  Jon, who helps manage his medications, and finds his medication packaging confusing. He plans to review this with her.       Current Outpatient Medications  Medication Instructions   albuterol  (VENTOLIN  HFA) 108 (90 Base) MCG/ACT inhaler 2 puffs, Inhalation, Every 4 hours PRN   alfuzosin  (UROXATRAL ) 10 MG 24 hr tablet TAKE 1 TABLET(10 MG) BY MOUTH DAILY WITH BREAKFAST   amoxicillin -clavulanate (AUGMENTIN ) 875-125 MG tablet 1 tablet, Oral, 2 times daily   ARIPiprazole  (ABILIFY ) 10 MG tablet TAKE 1 TABLET(10 MG) BY MOUTH DAILY   ARIPiprazole  (ABILIFY ) 10 mg, Oral, Daily   atorvastatin  (LIPITOR) 20 MG tablet Take 1 tablet (20 mg total) by mouth in the morning   BREO ELLIPTA  100-25 MCG/ACT AEPB 1 puff, Daily   budesonide-glycopyrrolate -formoterol (BREZTRI  AEROSPHERE) 160-9-4.8 MCG/ACT AERO inhaler 2 puffs, Inhalation, 2 times daily   cyclobenzaprine  (FLEXERIL ) 10 mg, Oral, 3 times daily PRN   DULoxetine  (CYMBALTA ) 60 MG capsule TAKE 1 CAPSULE(60 MG) BY MOUTH DAILY   DULoxetine  (CYMBALTA ) 20 mg, Oral, Every morning   GELSYN-3 16.8 MG/2ML SOSY intra-articular injection    hydrochlorothiazide  (HYDRODIURIL ) 25 MG tablet TAKE 1 TABLET(25 MG) BY MOUTH DAILY   ipratropium-albuterol  (DUONEB) 0.5-2.5 (3) MG/3ML SOLN 3 mLs, Nebulization, Every 6 hours PRN   metolazone  (ZAROXOLYN ) 5 mg, Oral, Daily PRN, For swelling only   metoprolol  succinate (TOPROL -XL) 25 mg, Oral, Daily   nicotine  (NICODERM CQ  - DOSED IN MG/24 HOURS) 21 mg, Transdermal, Daily   nicotine  (NICODERM CQ  - DOSED IN MG/24 HOURS) 14 mg,  Transdermal, Daily   nicotine  (NICODERM CQ  - DOSED IN MG/24 HR) 7 mg, Transdermal, Daily   NIFEdipine  (PROCARDIA  XL/NIFEDICAL XL) 120 mg, Oral, Daily at bedtime   olmesartan  (BENICAR ) 40 mg, Oral, Daily at bedtime   ondansetron  (ZOFRAN ) 4 mg, Oral, Every 8 hours PRN   Oxycodone  HCl 10 mg, 4 times daily   pantoprazole  (PROTONIX ) 40 MG tablet Take 1 tablet (40 mg total) by mouth every morning   potassium  chloride SA (KLOR-CON  M) 20 MEQ tablet 20 mEq, Oral, Daily   predniSONE  (DELTASONE ) 20 MG tablet Take 3 tablets (60 mg total) by mouth daily with breakfast for 3 days, THEN 2 tablets (40 mg total) daily with breakfast for 3 days, THEN 1 tablet (20 mg total) daily with breakfast for 3 days, THEN 0.5 tablets (10 mg total) daily with breakfast for 3 days.   spironolactone  (ALDACTONE ) 12.5 mg, Oral, Every morning   Xarelto  20 mg, Oral, Daily at bedtime    Patient Active Problem List   Diagnosis Date Noted   COPD with acute exacerbation (HCC) 12/15/2023   Tobacco abuse counseling 12/15/2023   Acute hypoxic respiratory failure (HCC) 12/11/2023   Pulmonary emphysema (HCC) 08/20/2023   Vitamin D  deficiency 08/20/2023   Iron  deficiency anemia secondary to inadequate dietary iron  intake 08/20/2023   Right hip pain 05/15/2023   Tobacco dependence 05/15/2023   Lumbar radiculopathy 03/11/2023   Spinal stenosis of lumbar region 02/28/2023   HTN (hypertension), benign 02/28/2023   History of DVT (deep vein thrombosis) 02/28/2023   Hyperlipidemia 02/28/2023   Depression with anxiety 02/28/2023   Microcytic anemia 02/28/2023   Renal mass; right 01/08/2023   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   BPH with obstruction/lower urinary tract symptoms 01/08/2023   Chronic prostatitis 01/08/2023   Lesion of right native kidney 01/03/2023   Tick bite of right thigh 01/03/2023   Moderate episode of recurrent major depressive disorder (HCC) 01/03/2023   Right flank pain 05/01/2021   Chronic right-sided back pain 05/01/2021   Chronic diarrhea 05/01/2021   Primary osteoarthritis of left knee 03/29/2020   Primary osteoarthritis of right knee 07/08/2017   Complete rotator cuff tear of left shoulder 10/21/2016   Gout attack 04/02/2016   Angina pectoris 08/01/2014   Hemorrhage of rectum and anus 03/28/2014   Neuropathy, peripheral 05/15/2012   Pulmonary embolism (HCC) 07/11/2011   DVT (deep venous thrombosis) (HCC)  07/11/2011   Abdominal pain, left lower quadrant 01/29/2011   Long term current use of anticoagulant therapy 01/29/2011   Diarrhea 01/29/2011     Review of Systems  Constitutional:  Positive for diaphoresis, fever and malaise/fatigue. Negative for chills.  Respiratory:  Positive for cough, shortness of breath and wheezing.   Genitourinary:  Positive for flank pain.  All other systems reviewed and are negative.     Objective:     BP (!) 140/62   Pulse 85   Temp 99.2 F (37.3 C) (Oral)   Ht 6' 4 (1.93 m)   Wt 228 lb 1.6 oz (103.5 kg)   SpO2 93% Comment: post nebeulizer treatment--jaf  BMI 27.77 kg/m    Physical Exam Cardiovascular:     Rate and Rhythm: Normal rate and regular rhythm.     Heart sounds: Normal heart sounds. No murmur heard. Pulmonary:     Effort: Prolonged expiration present. No retractions.     Breath sounds: No decreased air movement. Wheezing present.     Comments: Severe decreased in air movement in all lung fields, there is  minimal high pitched wheezing in all lung fields, inspiratory mostly Musculoskeletal:     Right lower leg: Edema (1+ BLE) present.     Left lower leg: Edema present.  Neurological:     Mental Status: He is oriented to person, place, and time. Mental status is at baseline.  Psychiatric:        Mood and Affect: Mood normal.        Behavior: Behavior normal.      No results found for any visits on 09/17/24.    The ASCVD Risk score (Arnett DK, et al., 2019) failed to calculate for the following reasons:   Cannot find a previous HDL lab   Cannot find a previous total cholesterol lab   * - Cholesterol units were assumed    Assessment & Plan:  Acute hypoxic respiratory failure (HCC)  Initial room air sat was 88%, after treatment he was reassess and he is breathing better, more lung sounds and his oxygen sat improved to 93% on room air.   COPD with acute exacerbation (HCC) -     Pulmonary Visit -     methylPREDNISolone   Acetate -     Ipratropium-Albuterol  -     predniSONE ; Take 3 tablets (60 mg total) by mouth daily with breakfast for 3 days, THEN 2 tablets (40 mg total) daily with breakfast for 3 days, THEN 1 tablet (20 mg total) daily with breakfast for 3 days, THEN 0.5 tablets (10 mg total) daily with breakfast for 3 days.  Dispense: 19.5 tablet; Refill: 0 -     Ipratropium-Albuterol ; Take 3 mLs by nebulization every 6 (six) hours as needed.  Dispense: 360 mL; Refill: 0 -     methylPREDNISolone  Acetate -     methylPREDNISolone  Acetate  OSA (obstructive sleep apnea) -     Pulmonary Visit   Assessment and Plan    COPD with acute exacerbation Chronic obstructive pulmonary disease with acute exacerbation, presenting with cough, congestion, shortness of breath, and yellow sputum. Oxygen saturation levels are low, indicating possible respiratory failure. Symptoms have persisted since the last visit with Dr. Delight. Differential includes viral infection or exacerbation due to chronic disease. Current treatment includes Breztri  and albuterol  inhalers, but symptoms persist, indicating need for further intervention. - Administered albuterol  nebulizer treatment in office - Administered 80 mg Depo-Medrol  injection in office - Prescribed longer course of oral steroids - Continue Augmentin  as prescribed by Dr. Delight - Referred to pulmonologist for further evaluation and management - Will consider home oxygen therapy if oxygen levels remain low  Obstructive sleep apnea Previous recommendation for CPAP titration study and new CPAP machine. No current CPAP machine in use. - Ensure CPAP titration study and new CPAP machine are obtained  Anemia Noted with low hemoglobin levels, contributing to shortness of breath. Previous echocardiogram in April and elevated troponin levels in November suggest possible cardiac involvement. - Continue to monitor hemoglobin levels - Will consider referral to cardiologist for further  evaluation of cardiac function      I personally spent a total of 30 minutes in the care of the patient today including getting/reviewing separately obtained history, performing a medically appropriate exam/evaluation, counseling and educating, placing orders, documenting clinical information in the EHR, independently interpreting results, and communicating results.   Return in about 3 months (around 12/16/2024) for HTN, COPD.    Heron CHRISTELLA Sharper, MD "

## 2024-09-24 ENCOUNTER — Encounter: Payer: Self-pay | Admitting: Family Medicine

## 2024-09-25 ENCOUNTER — Other Ambulatory Visit: Payer: Self-pay | Admitting: Family Medicine

## 2024-10-12 ENCOUNTER — Other Ambulatory Visit: Payer: Self-pay

## 2024-12-16 ENCOUNTER — Ambulatory Visit: Admitting: Family Medicine
